# Patient Record
Sex: Male | Born: 1940
Health system: Southern US, Community
[De-identification: ages and names within clinical notes are randomized; demographics above are authoritative.]

## PROBLEM LIST (undated history)

## (undated) DIAGNOSIS — E785 Hyperlipidemia, unspecified: Secondary | ICD-10-CM

## (undated) DIAGNOSIS — M2550 Pain in unspecified joint: Secondary | ICD-10-CM

## (undated) DIAGNOSIS — I2510 Atherosclerotic heart disease of native coronary artery without angina pectoris: Secondary | ICD-10-CM

## (undated) DIAGNOSIS — I739 Peripheral vascular disease, unspecified: Secondary | ICD-10-CM

## (undated) DIAGNOSIS — I4719 Other supraventricular tachycardia: Secondary | ICD-10-CM

## (undated) DIAGNOSIS — I639 Cerebral infarction, unspecified: Secondary | ICD-10-CM

## (undated) DIAGNOSIS — E119 Type 2 diabetes mellitus without complications: Secondary | ICD-10-CM

## (undated) DIAGNOSIS — I779 Disorder of arteries and arterioles, unspecified: Secondary | ICD-10-CM

## (undated) DIAGNOSIS — I10 Essential (primary) hypertension: Secondary | ICD-10-CM

## (undated) DIAGNOSIS — I471 Supraventricular tachycardia: Secondary | ICD-10-CM

## (undated) HISTORY — PX: CARDIAC CATHETERIZATION: SHX172

## (undated) HISTORY — DX: Cerebral infarction, unspecified: I63.9

## (undated) HISTORY — DX: Pain in unspecified joint: M25.50

## (undated) HISTORY — DX: Supraventricular tachycardia: I47.1

## (undated) HISTORY — PX: BACK SURGERY: SHX140

## (undated) HISTORY — DX: Type 2 diabetes mellitus without complications: E11.9

## (undated) HISTORY — DX: Disorder of arteries and arterioles, unspecified: I77.9

## (undated) HISTORY — DX: Other supraventricular tachycardia: I47.19

## (undated) HISTORY — DX: Essential (primary) hypertension: I10

## (undated) HISTORY — DX: Hyperlipidemia, unspecified: E78.5

## (undated) HISTORY — PX: CORONARY ANGIOPLASTY WITH STENT PLACEMENT: SHX49

## (undated) HISTORY — DX: Atherosclerotic heart disease of native coronary artery without angina pectoris: I25.10

## (undated) HISTORY — DX: Peripheral vascular disease, unspecified: I73.9

---

## 2005-08-16 ENCOUNTER — Encounter: Payer: Self-pay | Admitting: Cardiology

## 2005-08-16 ENCOUNTER — Other Ambulatory Visit: Payer: Self-pay

## 2005-08-16 ENCOUNTER — Inpatient Hospital Stay: Payer: Self-pay | Admitting: Internal Medicine

## 2005-08-17 ENCOUNTER — Other Ambulatory Visit: Payer: Self-pay

## 2005-08-17 ENCOUNTER — Encounter: Payer: Self-pay | Admitting: Cardiology

## 2005-08-18 ENCOUNTER — Encounter: Payer: Self-pay | Admitting: Cardiology

## 2005-08-20 ENCOUNTER — Encounter: Payer: Self-pay | Admitting: Cardiology

## 2007-03-12 ENCOUNTER — Encounter: Payer: Self-pay | Admitting: Cardiology

## 2008-05-07 ENCOUNTER — Encounter: Payer: Self-pay | Admitting: Cardiology

## 2008-08-06 ENCOUNTER — Encounter: Payer: Self-pay | Admitting: Cardiology

## 2008-08-06 LAB — CONVERTED CEMR LAB
Albumin: 4.4 g/dL
Alkaline Phosphatase: 76 units/L
Cholesterol: 147 mg/dL
HDL: 37.5 mg/dL
Total Bilirubin: 0.9 mg/dL
Triglyceride fasting, serum: 111 mg/dL

## 2008-11-03 ENCOUNTER — Encounter: Payer: Self-pay | Admitting: Cardiology

## 2009-04-27 ENCOUNTER — Ambulatory Visit: Payer: Self-pay | Admitting: Cardiology

## 2009-04-27 DIAGNOSIS — I259 Chronic ischemic heart disease, unspecified: Secondary | ICD-10-CM | POA: Insufficient documentation

## 2009-04-27 DIAGNOSIS — I25118 Atherosclerotic heart disease of native coronary artery with other forms of angina pectoris: Secondary | ICD-10-CM | POA: Insufficient documentation

## 2009-04-27 DIAGNOSIS — E785 Hyperlipidemia, unspecified: Secondary | ICD-10-CM | POA: Insufficient documentation

## 2009-04-27 DIAGNOSIS — I10 Essential (primary) hypertension: Secondary | ICD-10-CM | POA: Insufficient documentation

## 2009-04-27 DIAGNOSIS — I2510 Atherosclerotic heart disease of native coronary artery without angina pectoris: Secondary | ICD-10-CM

## 2009-05-10 ENCOUNTER — Encounter: Payer: Self-pay | Admitting: Cardiology

## 2009-05-24 ENCOUNTER — Encounter: Payer: Self-pay | Admitting: Cardiology

## 2009-06-01 ENCOUNTER — Ambulatory Visit: Payer: Self-pay

## 2009-06-01 ENCOUNTER — Encounter: Payer: Self-pay | Admitting: Cardiology

## 2009-06-18 ENCOUNTER — Encounter: Payer: Self-pay | Admitting: Cardiology

## 2009-06-18 DIAGNOSIS — R002 Palpitations: Secondary | ICD-10-CM

## 2009-06-18 LAB — CONVERTED CEMR LAB
ALT: 30 units/L (ref 0–53)
CO2: 26 meq/L (ref 19–32)
Calcium: 9.2 mg/dL (ref 8.4–10.5)
Chloride: 101 meq/L (ref 96–112)
Cholesterol: 123 mg/dL (ref 0–200)
Creatinine, Ser: 0.97 mg/dL (ref 0.40–1.50)
Glucose, Bld: 140 mg/dL — ABNORMAL HIGH (ref 70–99)
Total Bilirubin: 0.6 mg/dL (ref 0.3–1.2)
Total CHOL/HDL Ratio: 3.2
Total Protein: 8 g/dL (ref 6.0–8.3)
Triglycerides: 133 mg/dL (ref <150)
VLDL: 27 mg/dL (ref 0–40)

## 2010-04-14 ENCOUNTER — Telehealth: Payer: Self-pay | Admitting: Cardiology

## 2010-04-18 ENCOUNTER — Encounter: Payer: Self-pay | Admitting: Cardiology

## 2010-04-25 ENCOUNTER — Encounter: Payer: Self-pay | Admitting: Cardiology

## 2010-04-25 ENCOUNTER — Ambulatory Visit
Admission: RE | Admit: 2010-04-25 | Discharge: 2010-04-25 | Payer: Self-pay | Source: Home / Self Care | Attending: Cardiology | Admitting: Cardiology

## 2010-04-25 DIAGNOSIS — R0989 Other specified symptoms and signs involving the circulatory and respiratory systems: Secondary | ICD-10-CM | POA: Insufficient documentation

## 2010-05-03 ENCOUNTER — Encounter (INDEPENDENT_AMBULATORY_CARE_PROVIDER_SITE_OTHER): Payer: Self-pay | Admitting: *Deleted

## 2010-05-03 ENCOUNTER — Ambulatory Visit: Admission: RE | Admit: 2010-05-03 | Discharge: 2010-05-03 | Payer: Self-pay | Source: Home / Self Care

## 2010-05-03 NOTE — Assessment & Plan Note (Signed)
Summary: NP6   Visit Type:  New Patient Primary Provider:  Dr. Candelaria Stagers  CC:  no complaints.  History of Present Illness: 70 yo with history of CAD s/p apical MI presents to establish cardiology followup.  Patient had an apical MI in 5/07 with 3 stents placed in the LAD.  His most recent stress test available in the records that I have received was in 12/08 and showed a partially reversible inferoapical defect similar to prior stress in 12/07.  EF was preserved by echo and myoview.    Patient has had no recent chest pain.  He has been doing quite well in general.  He works part time at a [employer].  He is very active and denies any exertional dyspnea.  He is able to climb a flight of steps with no trouble.  No orthopnea or PND.  He is a prior smoker but has been off cigarettes for many years now.  BP is at goal today and diabetes is diet-controlled.   Labs (5/10): LDL 87, HDL 38  ECG: NSR, normal ECG  Current Medications (verified): 1)  Plavix 75 Mg Tabs (Clopidogrel Bisulfate) .... Take One Tablet By Mouth Daily 2)  Lisinopril 5 Mg Tabs (Lisinopril) .... Take One Tablet By Mouth Daily 3)  Aspirin 81 Mg Tbec (Aspirin) .... Take One Tablet By Mouth Daily 4)  Niaspan 500 Mg Cr-Tabs (Niacin (Antihyperlipidemic)) .Marland Kitchen.. 1 Tablets By Mouth Once Daily 5)  Simvastatin 40 Mg Tabs (Simvastatin) .... Take One Tablet By Mouth Daily At Bedtime 6)  Metoprolol Succinate 25 Mg Xr24h-Tab (Metoprolol Succinate) .... Take One Tablet By Mouth Daily  Allergies (verified): No Known Drug Allergies  Past History:  Past Medical History: 1. CAD: Apical MI 5/07 with LHC showing 50% pCFX, 30% pRCA, 99% pLAD, 95% mLAD, 75% dLAD.  Cypher DES x 3 to LAD.  ETT-myoview (12/08): 81% MPHR, 8'1", EF 64%, partially reversible inferoapical perfusion defect similar to prior study 12/07.  2. Hypertension 3. Hyperlipidemia 4. Arthritis 5. Diet-controlled diabetes 6. Echo (7/07): EF 55%, mild MR, mild TR  Family  History: Noncontributory  Social History: Part Time work at [employer] Married  Tobacco Use - Former.  Alcohol Use - no Regular Exercise - no Drug Use - no  Review of Systems       All systems reviewed and negative except as per HPI.   Vital Signs:  Patient profile:   70 year old male Height:      67 inches Weight:      164.25 pounds BMI:     25.82 Pulse rate:   62 / minute Pulse rhythm:   regular BP sitting:   132 / 64  (right arm) Cuff size:   large  Vitals Entered By: Raymond Sampson (April 27, 2009 4:29 PM)  Physical Exam  General:  Well developed, well nourished, in no acute distress. Head:  normocephalic and atraumatic Nose:  no deformity, discharge, inflammation, or lesions Mouth:  Teeth, gums and palate normal. Oral mucosa normal. Neck:  Neck supple, no JVD. No masses, thyromegaly or abnormal cervical nodes. Lungs:  Clear bilaterally to auscultation and percussion. Heart:  Non-displaced PMI, chest non-tender; regular rate and rhythm, S1, S2 without murmurs.  Soft S4. Carotid upstroke normal, no bruit. Pedals normal pulses. No edema, no varicosities. Abdomen:  Bowel sounds positive; abdomen soft and non-tender without masses, organomegaly, or hernias noted. No hepatosplenomegaly. Msk:  Back normal, normal gait. Muscle strength and tone normal. Extremities:  No clubbing or  cyanosis. Neurologic:  Alert and oriented x 3. Skin:  Intact without lesions or rashes. Psych:  Normal affect.   Impression & Recommendations:  Problem # 1:  CAD, NATIVE VESSEL (ICD-414.01) No recent ischemic symptoms and good exercise tolerance.  I discussed Plavix at length with Raymond Sampson.  He is 4 years post-stent placement and risk of stent thrombosis at this point is low.  He has 3 Cypher drug eluting stents.  He has had no bleeding complications.  We decided to continue Plavix for now because of a possible small degree of incremental benefit, especially as he has had no bleeding  problems and has a large surface area of stent.  He will also continue statin, ACEI, ASA, and Toprol XL.  I will get an echo to assess LV systolic function.  No indication for stress test.   Problem # 2:  HYPERLIPIDEMIA-MIXED (ICD-272.4) We need to repeat lipids/LFTs.  LDL was a little above goal (< 70) in 5/10.  Patient is eager to discontinue Niaspan.  He is on a very low dose.  I think it would be ok to stop at this point.  If LDL is  elevated, I will increase the statin.    Problem # 3:  HYPERTENSION, UNSPECIFIED (ICD-401.9) BP at goal. Continue meds.   Patient Instructions: 1)  Your physician recommends that you schedule a follow-up appointment in: 6 months 2)  Your physician recommends that you return for a FASTING lipid profile: 1 month (lipids/cmet/tsh) 05/24/2009 @ 8:00 3)  Your physician has recommended you make the following change in your medication: stop niaspan 4)  Your physician has requested that you have an echocardiogram.  Echocardiography is a painless test that uses sound waves to create images of your heart. It provides your doctor with information about the size and shape of your heart and how well your heart's chambers and valves are working.  This procedure takes approximately one hour. There are no restrictions for this procedure. 06/01/2009 @ 4:00pm  Prevention & Chronic Care Immunizations   Influenza vaccine: Not documented    Tetanus booster: Not documented    Pneumococcal vaccine: Not documented    H. zoster vaccine: Not documented  Colorectal Screening   Hemoccult: Not documented    Colonoscopy: Not documented  Other Screening   PSA: Not documented   Smoking status: quit  (04/27/2009)  Lipids   Total Cholesterol: Not documented   LDL: Not documented   LDL Direct: Not documented   HDL: Not documented   Triglycerides: Not documented    SGOT (AST): Not documented   SGPT (ALT): Not documented   Alkaline phosphatase: Not documented   Total bilirubin:  Not documented  Hypertension   Last Blood Pressure: 132 / 64  (04/27/2009)   Serum creatinine: Not documented   Serum potassium Not documented  Self-Management Support :    Hypertension self-management support: Not documented    Lipid self-management support: Not documented     Appended Document: Orders Update    Clinical Lists Changes  Orders: Added new Referral order of Echocardiogram (Echo) - Signed

## 2010-05-03 NOTE — Progress Notes (Signed)
Summary: [facility]   Imported By: Harlon Flor 04/28/2009 10:10:38  _____________________________________________________________________  External Attachment:    Type:   Image     Comment:   External Document

## 2010-05-03 NOTE — Progress Notes (Signed)
Summary: PHI  PHI   Imported By: Harlon Flor 04/28/2009 10:11:43  _____________________________________________________________________  External Attachment:    Type:   Image     Comment:   External Document

## 2010-05-03 NOTE — Cardiovascular Report (Signed)
Summary: Cardiac Cath Other  Cardiac Cath Other   Imported By: Harlon Flor 04/28/2009 09:06:43  _____________________________________________________________________  External Attachment:    Type:   Image     Comment:   External Document

## 2010-05-03 NOTE — Miscellaneous (Signed)
Summary: update with dx code  Clinical Lists Changes  Problems: Added new problem of PALPITATIONS (ICD-785.1)

## 2010-05-03 NOTE — Letter (Signed)
[facility]   Imported By: Harlon Flor 04/28/2009 09:17:31  _____________________________________________________________________  External Attachment:    Type:   Image     Comment:   External Document

## 2010-05-05 NOTE — Progress Notes (Signed)
Summary: F/U appt/Rx refill  Phone Note Other Incoming   Caller: Pt's wife Summary of Call: Pt's wife called to schedule pt's f/u, he was seen 04/2009 and was to f/u in 6 months. He did not f/u then, he had echo 06/2009. Pt's wife would also like [place] to refill lisinopril. Initial call taken by: Lanny Hurst RN,  April 14, 2010 10:13 AM    New/Updated Medications: LISINOPRIL 5 MG TABS (LISINOPRIL) Take one tablet by mouth daily Prescriptions: LISINOPRIL 5 MG TABS (LISINOPRIL) Take one tablet by mouth daily  #90 x 3   Entered by:   Lanny Hurst RN   Authorized by:   Marca Ancona, MD   Signed by:   Lanny Hurst RN on 04/14/2010   Method used:   Electronically to        [facility] [phone].* (retail)       [address]       [place], [place]  [address]       Ph: [phone]       Fax: [phone]   RxID:   [id number]

## 2010-05-05 NOTE — Assessment & Plan Note (Signed)
Summary: ROV   Visit Type:  Follow-up Primary Provider:  Dr. Candelaria Stagers  CC:  c/o right shoulder pain. Denies chest pain and SOB.Marland Kitchen  History of Present Illness: 70 yo with history of CAD s/p apical MI presents to for cardiology followup.  Patient had an apical MI in 5/07 with 3 stents placed in the LAD.  His most recent stress test available in the records that I have received was in 12/08 and showed a partially reversible inferoapical defect similar to prior stress in 12/07.  Echo in 3/11 showed preserved EF.   Patient has had no recent chest pain.  He has been doing quite well in general.  He works part time at a [employer].  He is very active and denies any exertional dyspnea.  He is able to climb a flight of steps with no trouble.  No orthopnea or PND.  He is a prior smoker but has been off cigarettes for many years now.  BP is at goal today.  He has had to start metformin for diabetes.    Labs (5/10): LDL 87, HDL 38  ECG: NSR, poor anterior R wave progression  Current Medications (verified): 1)  Lisinopril 5 Mg Tabs (Lisinopril) .... Take One Tablet By Mouth Daily 2)  Aspirin 81 Mg Tbec (Aspirin) .... Take One Tablet By Mouth Daily 3)  Simvastatin 40 Mg Tabs (Simvastatin) .... Take One Tablet By Mouth Daily At Bedtime 4)  Metoprolol Succinate 25 Mg Xr24h-Tab (Metoprolol Succinate) .... Take One Tablet By Mouth Daily 5)  Metformin Hcl 500 Mg Tabs (Metformin Hcl) .... 2 Tablets Daily  Allergies (verified): No Known Drug Allergies  Past History:  Family History: Last updated: 04/27/2009 Noncontributory  Social History: Last updated: 04/25/2010 Part Time work at [employer] Married  Tobacco Use - Former.  Alcohol Use - no Regular Exercise - no Drug Use - no  Risk Factors: Exercise: no (04/27/2009)  Risk Factors: Smoking Status: quit (04/27/2009)  Past Medical History: 1. CAD: Apical MI 5/07 with LHC showing 50% pCFX, 30% pRCA, 99% pLAD, 95% mLAD, 75% dLAD.   Cypher DES x 3 to LAD.  ETT-myoview (12/08): 81% MPHR, 8'1", EF 64%, partially reversible inferoapical perfusion defect similar to prior study 12/07.  2. Hypertension 3. Hyperlipidemia 4. Arthritis 5. Diet-controlled diabetes 6. Echo (3/11): EF 55-65%, mild LV hypertrophy, normal wall motion, mild MR, RV normal.   Family History: Reviewed history from 04/27/2009 and no changes required. Noncontributory  Social History: Reviewed history from 04/27/2009 and no changes required. Part Time work at [employer] Married  Tobacco Use - Former.  Alcohol Use - no Regular Exercise - no Drug Use - no  Review of Systems       All systems reviewed and negative except as per HPI.   Vital Signs:  Patient profile:   70 year old male Height:      67 inches Weight:      165 pounds BMI:     25.94 Pulse rate:   80 / minute BP sitting:   124 / 62  (left arm) Cuff size:   regular  Vitals Entered By: Lysbeth Galas CMA (April 25, 2010 4:02 PM)  Physical Exam  General:  Well developed, well nourished, in no acute distress. Neck:  Neck supple, no JVD. No masses, thyromegaly or abnormal cervical nodes. Lungs:  Clear bilaterally to auscultation and percussion. Heart:  Non-displaced PMI, chest non-tender; regular rate and rhythm, S1, S2 without murmurs.  Soft S4. Carotid upstroke  normal, left carotid bruit.  Pedals normal pulses. No edema, no varicosities. Abdomen:  Bowel sounds positive; abdomen soft and non-tender without masses, organomegaly, or hernias noted. No hepatosplenomegaly. Extremities:  No clubbing or cyanosis. Neurologic:  Alert and oriented x 3. Psych:  Normal affect.   Impression & Recommendations:  Problem # 1:  CAROTID BRUIT, LEFT (ICD-785.9) Soft left carotid bruit.  Will get carotid dopplers.   Problem # 2:  CAD, NATIVE VESSEL (ICD-414.01) No recent ischemic symptoms and good exercise tolerance.  I have continued him on Plavix given his 3 drug eluting stents and the  possibility of a degree of incremental benefit in this setting even though his stents were back in 2007, especially as he has had no bleeding problems and has a large surface area of stent.  He will also continue statin, ACEI, ASA, and Toprol XL.  EF preserved on echo in 3/11.    Problem # 3:  HYPERLIPIDEMIA-MIXED (ICD-272.4) Goal LDL < 70.  He had recent lipids at his PCP's office.  I will try to get these to review.   Other Orders: EKG w/ Interpretation (93000) Carotid Duplex (Carotid Duplex)  Patient Instructions: 1)  Your physician recommends that you schedule a follow-up appointment in: 1 year 2)  Your physician recommends that you continue on your current medications as directed. Please refer to the Current Medication list given to you today. 3)  Your physician has requested that you have a carotid duplex. This test is an ultrasound of the carotid arteries in your neck. It looks at blood flow through these arteries that supply the brain with blood. Allow one hour for this exam. There are no restrictions or special instructions.

## 2010-05-27 ENCOUNTER — Telehealth: Payer: Self-pay | Admitting: Cardiology

## 2010-05-31 NOTE — Progress Notes (Signed)
  Phone Note Call from Patient   Caller: Spouse Details for Reason: medication change Summary of Call: The patient would like to switch from Metoprolol ER once daily @ $45.00 for one month to Metoprolol Tartrate 25mg  one two times a day @ $4.00 a month.  What do you recommend?  Please contact @ this [phone]. Initial call taken by: Bishop Dublin, CMA,  May 27, 2010 11:29 AM     Appended Document:  may switch to metoprolol 25 mg two times a day.   Appended Document:  Rx sent in for the metoprolol tart 25 mg one tablet two times a day to [facility] [address]

## 2010-07-27 ENCOUNTER — Inpatient Hospital Stay: Payer: Self-pay | Admitting: Internal Medicine

## 2010-10-26 ENCOUNTER — Encounter: Payer: Self-pay | Admitting: Cardiology

## 2010-11-17 ENCOUNTER — Telehealth: Payer: Self-pay | Admitting: *Deleted

## 2010-11-17 MED ORDER — SIMVASTATIN 40 MG PO TABS: 40.0000 mg | ORAL_TABLET | Freq: Every day | ORAL | Status: DC

## 2010-11-17 NOTE — Telephone Encounter (Signed)
Need a refill sent to [facility] [address]

## 2011-04-18 ENCOUNTER — Encounter: Payer: Self-pay | Admitting: Cardiology

## 2011-04-18 ENCOUNTER — Encounter: Payer: Self-pay | Admitting: *Deleted

## 2011-04-18 ENCOUNTER — Ambulatory Visit (INDEPENDENT_AMBULATORY_CARE_PROVIDER_SITE_OTHER): Payer: Medicare Other | Admitting: Cardiology

## 2011-04-18 VITALS — BP 140/72 | HR 77 | Ht 67.0 in | Wt 161.0 lb

## 2011-04-18 DIAGNOSIS — R064 Hyperventilation: Secondary | ICD-10-CM

## 2011-04-18 DIAGNOSIS — R079 Chest pain, unspecified: Secondary | ICD-10-CM

## 2011-04-18 DIAGNOSIS — E785 Hyperlipidemia, unspecified: Secondary | ICD-10-CM

## 2011-04-18 DIAGNOSIS — R0989 Other specified symptoms and signs involving the circulatory and respiratory systems: Secondary | ICD-10-CM

## 2011-04-18 DIAGNOSIS — I2510 Atherosclerotic heart disease of native coronary artery without angina pectoris: Secondary | ICD-10-CM

## 2011-04-18 NOTE — Patient Instructions (Signed)
Your physician has requested that you have en exercise stress myoview. For further information please visit [email or web address] Please follow instruction sheet, as given.  Your physician has requested that you have a carotid duplex. This test is an ultrasound of the carotid arteries in your neck. It looks at blood flow through these arteries that supply the brain with blood. Allow one hour for this exam. There are no restrictions or special instructions. 05/05/11 @ 2:30 PM at the [facility].  Your physician recommends that you schedule a follow-up appointment in: 1 year

## 2011-04-18 NOTE — Progress Notes (Signed)
PCP: Dr. Candelaria Stagers  71 yo with history of CAD s/p apical MI presents to for cardiology followup. Patient had an apical MI in 5/07 with 3 stents placed in the LAD. His most recent stress test available in the records that I have received was in 12/08 and showed a partially reversible inferoapical defect similar to prior stress in 12/07. Echo in 3/11 showed preserved EF.   He continues to work part time at a [employer]. He is very active and denies any exertional dyspnea. He is able to climb a flight of steps with no trouble. No orthopnea or PND. He is a prior smoker but has been off cigarettes for many years now. BP is borderline today.  He has occasional "twinges" of central chest pain that last < 1 minute at a time.  They seem to be brought on by stress rather than exertion.    Labs (5/10): LDL 87, HDL 38   ECG: NSR, lateral T wave inversions   Allergies (verified):  No Known Drug Allergies   Family History:  No premature CAD  Social History:  Part Time work at [employer]  Married  Tobacco Use - Former.  Alcohol Use - no  Regular Exercise - no  Drug Use - no   ROS: All systems reviewed and negative except as per HPI.   Past Medical History:  1. CAD: Apical MI 5/07 with LHC showing 50% pCFX, 30% pRCA, 99% pLAD, 95% mLAD, 75% dLAD. Cypher DES x 3 to LAD. ETT-myoview (12/08): 81% MPHR, 8'1", EF 64%, partially reversible inferoapical perfusion defect similar to prior study 12/07.  2. Hypertension  3. Hyperlipidemia  4. Arthritis  5. Diet-controlled diabetes  6. Echo (3/11): EF 55-65%, mild LV hypertrophy, normal wall motion, mild MR, RV normal.  7. Carotid stenosis: Dopplers (1/12) with 40-59% bilateral ICA stenosis.    Current Outpatient Prescriptions  Medication Sig Dispense Refill  . aspirin 81 MG tablet Take 160 mg by mouth daily.       Marland Kitchen lisinopril (PRINIVIL,ZESTRIL) 5 MG tablet Take 5 mg by mouth daily.        . metFORMIN (GLUMETZA) 500 MG (MOD) 24 hr tablet Take  500 mg by mouth 3 (three) times daily.       . metoprolol tartrate (LOPRESSOR) 25 MG tablet Take 25 mg by mouth 2 (two) times daily.        . simvastatin (ZOCOR) 40 MG tablet Take 1 tablet (40 mg total) by mouth at bedtime.  90 tablet  3   BP 140/72  Pulse 77  Ht 5\' 7"  (1.702 m)  Wt 73.029 kg (161 lb)  BMI 25.22 kg/m2 General: NAD Neck: No JVD, no thyromegaly or thyroid nodule.  Lungs: Clear to auscultation bilaterally with normal respiratory effort. CV: Nondisplaced PMI.  Heart regular S1/S2, no S3/S4, no murmur.  No peripheral edema.  No carotid bruit.  Normal pedal pulses.  Abdomen: Soft, nontender, no hepatosplenomegaly, no distention.  Neurologic: Alert and oriented x 3.  Psych: Normal affect. Extremities: No clubbing or cyanosis.

## 2011-04-19 NOTE — Assessment & Plan Note (Signed)
Repeat carotid dopplers to look for progression.

## 2011-04-19 NOTE — Assessment & Plan Note (Signed)
I will get recent lipids from PCP.  Goal LDL < 70.    As I am not going to be coming out regularly to the [place] office in the future, I will have Mr Tsang follow with Dr. Mariah Milling or Dr. Kirke Corin.

## 2011-04-19 NOTE — Assessment & Plan Note (Signed)
Atypical chest pain.  Seems to be triggered by stress more than anything else.  Last functional study was in 2008.  I am going to go ahead and get an ETT-myoview for risk stratification.  Continue ASA 81, lisinopril, metoprolol, statin.

## 2011-04-21 ENCOUNTER — Ambulatory Visit: Payer: Self-pay | Admitting: Cardiology

## 2011-04-21 DIAGNOSIS — R079 Chest pain, unspecified: Secondary | ICD-10-CM

## 2011-04-22 ENCOUNTER — Other Ambulatory Visit: Payer: Self-pay | Admitting: Cardiology

## 2011-04-24 ENCOUNTER — Telehealth: Payer: Self-pay | Admitting: *Deleted

## 2011-04-24 NOTE — Telephone Encounter (Signed)
Per Dr. Mariah Milling, pt's myoview was normal, pt notified.

## 2011-04-25 ENCOUNTER — Other Ambulatory Visit: Payer: Self-pay

## 2011-04-25 ENCOUNTER — Telehealth: Payer: Self-pay

## 2011-04-25 DIAGNOSIS — Z79899 Other long term (current) drug therapy: Secondary | ICD-10-CM

## 2011-04-25 DIAGNOSIS — I2510 Atherosclerotic heart disease of native coronary artery without angina pectoris: Secondary | ICD-10-CM

## 2011-04-25 DIAGNOSIS — I10 Essential (primary) hypertension: Secondary | ICD-10-CM

## 2011-04-25 MED ORDER — LISINOPRIL 20 MG PO TABS: 20.0000 mg | ORAL_TABLET | Freq: Every day | ORAL | Status: DC

## 2011-04-25 NOTE — Telephone Encounter (Signed)
A new Rx sent for lisinopril 20 mg take one tablet daily.

## 2011-04-26 ENCOUNTER — Telehealth: Payer: Self-pay | Admitting: *Deleted

## 2011-04-26 NOTE — Telephone Encounter (Signed)
I called pt's daughter, she says Raymond Sampson has already spoken to him about this and Lisinopril 20mg  sent in. He will start 20mg  daily and will return on 2/1 for carotid and have BMET as well. Pt will bring BP readings to appt.   Please let Raymond Sampson know that his nuclear scan looked normal except his BP was way too high. Have him increase lisinopril to 20 mg daily. He will need BMET in 2 wks and have him record BPs at home and see what they are running 2 wks after increasing lisinopril.  ----- Message ----- From: Raymond Sampson Sent: 04/25/2011 9:00 AM To: Raymond Ancona, MD, Raymond Sampson, CMA

## 2011-05-05 ENCOUNTER — Ambulatory Visit (INDEPENDENT_AMBULATORY_CARE_PROVIDER_SITE_OTHER): Payer: Medicare Other | Admitting: *Deleted

## 2011-05-05 ENCOUNTER — Encounter (INDEPENDENT_AMBULATORY_CARE_PROVIDER_SITE_OTHER): Payer: Medicare Other | Admitting: *Deleted

## 2011-05-05 ENCOUNTER — Telehealth: Payer: Self-pay | Admitting: *Deleted

## 2011-05-05 DIAGNOSIS — Z79899 Other long term (current) drug therapy: Secondary | ICD-10-CM

## 2011-05-05 DIAGNOSIS — R0989 Other specified symptoms and signs involving the circulatory and respiratory systems: Secondary | ICD-10-CM

## 2011-05-05 DIAGNOSIS — I2510 Atherosclerotic heart disease of native coronary artery without angina pectoris: Secondary | ICD-10-CM

## 2011-05-05 DIAGNOSIS — I10 Essential (primary) hypertension: Secondary | ICD-10-CM

## 2011-05-05 DIAGNOSIS — I6529 Occlusion and stenosis of unspecified carotid artery: Secondary | ICD-10-CM

## 2011-05-05 MED ORDER — LISINOPRIL 20 MG PO TABS: 20.0000 mg | ORAL_TABLET | Freq: Every day | ORAL | Status: DC

## 2011-05-05 NOTE — Telephone Encounter (Signed)
If creatinine ok, increase lisinopril to 40 mg daily with BMET in 2wks and BP check in 2 wks

## 2011-05-05 NOTE — Telephone Encounter (Signed)
Pt in today for BMET, he dropped off BP numbers from past 2 weeks after incr Lisinopril to 20mg  daily.  In the AM BP ranging 135-156/71-83. Afternoon and evening ranges 143-170/74-80. Please advise what med/dosage pt needs to continue, thanks.

## 2011-05-05 NOTE — Telephone Encounter (Signed)
Note forwarded to Bishop Dublin.

## 2011-05-06 LAB — BASIC METABOLIC PANEL
BUN/Creatinine Ratio: 17 (ref 10–22)
BUN: 17 mg/dL (ref 8–27)
Chloride: 101 mmol/L (ref 97–108)
GFR calc Af Amer: 88 mL/min/{1.73_m2} (ref >59)
Potassium: 4.8 mmol/L (ref 3.5–5.2)

## 2011-05-08 ENCOUNTER — Telehealth: Payer: Self-pay | Admitting: *Deleted

## 2011-05-08 NOTE — Telephone Encounter (Signed)
05/08/11--received message from [place] office to call pt and let him know creatinine normal--please start lisinopril 40mg  1 tab qd--([facility] [address]) # 20 with no refills--wife wants to try med first before ordering 90 day suply--pt will go to [place] office for repeat bmet ON 04/22/11-[place] office aware--nt

## 2011-05-08 NOTE — Telephone Encounter (Signed)
Forwarded to [facility] Triage pool for completion

## 2011-05-11 ENCOUNTER — Telehealth: Payer: Self-pay | Admitting: *Deleted

## 2011-05-11 NOTE — Telephone Encounter (Signed)
[name], [name] - 05/05/11 ','<More Detail >>       Dossie Arbour, RN     05/11/11 wife notified    Sent:  Thu May 11, 2011 8:08 AM   To:  Jacqlyn Krauss, RN                 Result Note       ----- Message ----- From: Oneida Arenas Sent: 05/11/2011 7:48 AM To: [facility] Triage  Forwarded to [facility] Triage to call patient with results  ----- Message ----- From: Marca Ancona, MD Sent: 05/10/2011 4:56 PM To: Bishop Dublin, CMA, Karoline Caldwell  60-79% RICA, followup in 6 months                     Result Notes     Notes Recorded by Marca Ancona, MD on 05/10/2011 at 4:56 PM 60-79% RICA, followup in 6 months              Results Carotid duplex (Order [id number])         Carotid duplex Status: Final result MyChart: Not Released Next appt with me: None Dx: CAROTID BRUIT, LEFT       Notes Recorded by Marca Ancona, MD on 05/10/2011 at 4:56 PM 60-79% RICA, followup in 6 months    Last Resulted: 05/05/11 3:25 PM      Raymond Sampson - 05/05/11 ','<More Detail >>       Dossie Arbour, RN         Sent:  Thu May 11, 2011 8:08 AM   To:  Jacqlyn Krauss, RN                 Result Note       ----- Message ----- From: Oneida Arenas Sent: 05/11/2011 7:48 AM To: Donne Hazel Triage  Forwarded to [facility] Triage to call patient with results  ----- Message ----- From: Marca Ancona, MD Sent: 05/10/2011 4:56 PM To: Bishop Dublin, CMA, Karoline Caldwell  60-79% RICA, followup in 6 months                     Result Notes     Notes Recorded by Marca Ancona, MD on 05/10/2011 at 4:56 PM 60-79% RICA, followup in 6 months              Results Carotid duplex (Order [id number])         Carotid duplex Status: Final result MyChart: Not Released Next appt with me: None Dx: CAROTID BRUIT, LEFT       Notes Recorded by Marca Ancona, MD on 05/10/2011 at 4:56 PM 60-79% RICA, followup in 6 months    Last Resulted:  05/05/11 3:25 PM      Raymond Sampson - 05/05/11 ','<More Detail >>       Dossie Arbour, RN         Sent:  Thu May 11, 2011 8:08 AM   To:  Jacqlyn Krauss, RN                 Result Note       ----- Message ----- From: Oneida Arenas Sent: 05/11/2011 7:48 AM To: Donne [facility]el Triage  Forwarded to [facility] Triage to call patient with results  ----- Message ----- From: Marca Ancona, MD Sent: 05/10/2011 4:56 PM To: Bishop Dublin, CMA, Karoline Caldwell  60-79% RICA, followup in 6 months  Result Notes     Notes Recorded by Marca Ancona, MD on 05/10/2011 at 4:56 PM 60-79% RICA, followup in 6 months              Results Carotid duplex (Order [id number])         Carotid duplex Status: Final result MyChart: Not Released Next appt with me: None Dx: CAROTID BRUIT, LEFT       Notes Recorded by Marca Ancona, MD on 05/10/2011 at 4:56 PM 60-79% RICA, followup in 6 months    Last Resulted: 05/05/11 3:25 PM

## 2011-05-23 ENCOUNTER — Other Ambulatory Visit: Payer: Medicare Other

## 2011-05-24 ENCOUNTER — Ambulatory Visit (INDEPENDENT_AMBULATORY_CARE_PROVIDER_SITE_OTHER): Payer: Medicare Other

## 2011-05-24 DIAGNOSIS — Z79899 Other long term (current) drug therapy: Secondary | ICD-10-CM

## 2011-05-25 ENCOUNTER — Telehealth: Payer: Self-pay | Admitting: *Deleted

## 2011-05-25 LAB — BASIC METABOLIC PANEL
BUN/Creatinine Ratio: 19 (ref 10–22)
BUN: 17 mg/dL (ref 8–27)
Chloride: 101 mmol/L (ref 97–108)
GFR calc Af Amer: 100 mL/min/{1.73_m2} (ref >59)
GFR calc non Af Amer: 86 mL/min/{1.73_m2} (ref >59)

## 2011-05-25 NOTE — Telephone Encounter (Signed)
Luan Moore, RN << Less Detail     Luan Moore, RN       Sent: Wed May 24, 2011  4:45 PM    To: Jacqlyn Krauss, RN       Bassem Bernasconi    MRN: [id number] DOB: Aug 11, 1940    Pt Home: [phone]               Message     Pt seen in office for BMP 2 weeks after start of Lisinopril.  BP checked per pt request 138/82.    I will forward to Dr Shirlee Latch.

## 2011-05-29 ENCOUNTER — Telehealth: Payer: Self-pay | Admitting: *Deleted

## 2011-05-29 NOTE — Telephone Encounter (Signed)
Message copied by Burnell Blanks on Mon May 29, 2011 11:01 AM ------      Message from: Phoebe Sharps      Created: Sun May 28, 2011  1:07 PM       Lab work looks normal after start of lisinopril.      We continue to work on your sugar level/diabetes

## 2011-05-29 NOTE — Telephone Encounter (Signed)
Advised wife of labs.  She stated that after he increased his Lisinopril to 40 mg has had "breaking out" and itching.  No problems at the 20 mg daily.  She has also noticed that he has had an increase in fatigue and white of eyes may be a little yellow. States he just does not feel as good on this higher dose.  They do not check blood pressure at home, need another monitor.  Will forward to Dr Shirlee Latch for review

## 2011-05-29 NOTE — Telephone Encounter (Signed)
Advised wife.  She will call back with blood pressure numbers in a couple of weeks.  Husband has appointment this afternoon at doctors office and they will take blood pressure monitor to make sure it is working properly.  Will forward to Dr Alford Highland nurse to make her aware

## 2011-05-29 NOTE — Telephone Encounter (Signed)
Can go back to prior dose but needs to follow BP.  Call in a couple of weeks to see what it is running.

## 2011-06-20 ENCOUNTER — Other Ambulatory Visit: Payer: Self-pay

## 2011-06-20 ENCOUNTER — Telehealth: Payer: Self-pay | Admitting: Cardiology

## 2011-06-20 MED ORDER — METOPROLOL TARTRATE 25 MG PO TABS: 25.0000 mg | ORAL_TABLET | Freq: Two times a day (BID) | ORAL | Status: DC

## 2011-06-20 NOTE — Telephone Encounter (Signed)
New Msg: Pharmacy calling with questions regarding metoprolol tartrate. Please return call to discuss further.   Pt is in the pharmacy now.

## 2011-06-20 NOTE — Telephone Encounter (Signed)
06/20/11--pharmacist calling wanting to fill toprol and lisinopril with enough to cover for 3 months--order given--nt

## 2011-07-13 ENCOUNTER — Encounter: Payer: Self-pay | Admitting: Cardiovascular Disease

## 2011-07-25 ENCOUNTER — Other Ambulatory Visit: Payer: Self-pay | Admitting: *Deleted

## 2011-07-25 ENCOUNTER — Other Ambulatory Visit: Payer: Self-pay | Admitting: Cardiology

## 2011-07-25 MED ORDER — LISINOPRIL 20 MG PO TABS: 20.0000 mg | ORAL_TABLET | Freq: Every day | ORAL | Status: DC

## 2011-07-25 NOTE — Telephone Encounter (Signed)
NEEDS 90 DAY supply

## 2011-07-25 NOTE — Telephone Encounter (Signed)
Refill was sent incorrectly. Resent lisinopril for 90 day supply to pharmacy.

## 2011-07-27 ENCOUNTER — Other Ambulatory Visit: Payer: Self-pay | Admitting: Cardiology

## 2011-07-27 NOTE — Telephone Encounter (Signed)
Daughter called and stated needs refill on lisinopril, but they had changed from whats on the current med list to 10mg  per [facility] due to problems.  They need 90 day supply called into [facility] [address].

## 2011-07-27 NOTE — Telephone Encounter (Signed)
Spoke to patients wife regarding lisinopril 10mg  refill request she is requesting due to no notes verifying when the patient was switched to lower dosage of lisinopril by Dr. Shirlee Latch. Pt mentioned that she is requesting 10mg  lisinopril tablets instead of 20mg  lisinopril tablets because wife mentioned that her husband was still having rash and itching because of the 20mg  lisinopril and since she started cutting his 20mg  tablets of lisinopril in 1/2 he has not been having this reaction.

## 2011-07-28 MED ORDER — LISINOPRIL 20 MG PO TABS: 20.0000 mg | ORAL_TABLET | Freq: Every day | ORAL | Status: DC

## 2011-08-21 ENCOUNTER — Telehealth: Payer: Self-pay | Admitting: Cardiovascular Disease

## 2011-08-21 NOTE — Telephone Encounter (Signed)
Pt wife called stating that pt is having severe cramps all over his body. They think it is coming from his medication simvastatin

## 2011-08-21 NOTE — Telephone Encounter (Signed)
LMTCB

## 2011-08-22 NOTE — Telephone Encounter (Signed)
Pt wife called back. She says pt has been experiencing severe leg cramps at hs. He is able to "walk them off" but then experiences cramps in arms and "ribcage" occasionally throughout day. Denies vomiting, diarrhea or drinking less fluids than usual.  Is urinating ok.  Denies slurred speech, blurred vision. Went to pick up simvastatin rx yesterday and pharm told pt about SE so pt did not want to get refill. Patient also has carotid u/s scheduled for August. Patient says he has felt pulsations in neck more than usual and wife notices times when pt seems to "stare off in to space". Has hx CVA.  I advised to have pt hold simvastatin until i discuss with Dr. Shirlee Latch. Wife asks that we discuss with Dr. Mariah Milling since he is in [place] office. I will do this at pt. Request.

## 2011-08-23 NOTE — Telephone Encounter (Signed)
Spoke with pt's wife. I informed her of Dr. Windell Hummingbird plan. She verb. Understanding and will call us should cramping not resolve after holding simvastatin.

## 2011-08-23 NOTE — Telephone Encounter (Signed)
LMTCB

## 2011-08-23 NOTE — Telephone Encounter (Signed)
Best thing would be to hold simvastatin for now to see if cramps resolve If they go away by holding medication, then we would try a different cholesterol medication  I evaluated his carotid ultrasound February 2013 There is approximately 40-50% bilateral blockage  Not significant enough to do surgery This will progress very slowly, over years, if at all Would continue aspirin for now Cholesterol pill when tolerated

## 2011-11-06 ENCOUNTER — Other Ambulatory Visit: Payer: Self-pay | Admitting: Cardiology

## 2011-11-06 DIAGNOSIS — I6529 Occlusion and stenosis of unspecified carotid artery: Secondary | ICD-10-CM

## 2011-11-07 ENCOUNTER — Encounter (INDEPENDENT_AMBULATORY_CARE_PROVIDER_SITE_OTHER): Payer: Medicare Other

## 2011-11-07 DIAGNOSIS — I6529 Occlusion and stenosis of unspecified carotid artery: Secondary | ICD-10-CM

## 2012-04-17 ENCOUNTER — Ambulatory Visit (INDEPENDENT_AMBULATORY_CARE_PROVIDER_SITE_OTHER): Payer: Medicare Other | Admitting: Cardiovascular Disease

## 2012-04-17 ENCOUNTER — Encounter: Payer: Self-pay | Admitting: Cardiovascular Disease

## 2012-04-17 VITALS — BP 148/80 | HR 73 | Ht 67.0 in | Wt 161.0 lb

## 2012-04-17 DIAGNOSIS — E785 Hyperlipidemia, unspecified: Secondary | ICD-10-CM

## 2012-04-17 DIAGNOSIS — I2510 Atherosclerotic heart disease of native coronary artery without angina pectoris: Secondary | ICD-10-CM

## 2012-04-17 DIAGNOSIS — R0989 Other specified symptoms and signs involving the circulatory and respiratory systems: Secondary | ICD-10-CM

## 2012-04-17 DIAGNOSIS — I10 Essential (primary) hypertension: Secondary | ICD-10-CM

## 2012-04-17 MED ORDER — ATORVASTATIN CALCIUM 10 MG PO TABS: 10.0000 mg | ORAL_TABLET | Freq: Every day | ORAL | Status: DC

## 2012-04-17 NOTE — Assessment & Plan Note (Signed)
Currently with no symptoms of angina. No further workup at this time. Continue current medication regimen. 

## 2012-04-17 NOTE — Assessment & Plan Note (Signed)
He has a rash with simvastatin even on repeat trial. We have suggested he try generic Lipitor 10 mg daily. This can be titrated upwards as tolerated.

## 2012-04-17 NOTE — Progress Notes (Signed)
Patient ID: Raymond Sampson, male    DOB: 05/26/1940, 72 y.o.   MRN: [id number]  HPI Comments: 72 yo with history of CAD s/p apical MI presents to for cardiology followup.  He has 40-50% bilateral carotid disease  Patient had an apical MI in 5/07 with 3 stents placed in the LAD.  stress test 12/08  showed a partially reversible inferoapical defect similar to prior stress in 12/07.  Echo in 3/11 showed preserved EF.    He is very active and denies any exertional dyspnea. He is able to climb a flight of steps with no trouble.  He is a prior smoker for 30 years but stopped smoking 25 years ago but has been off cigarettes for many years now. He reports that he stop simvastatin and this caused a rash. Even after retrying the medicine he had a rash in. He reports diabetes mellitus has been well controlled  Lab work when he was on simvastatin shows total cholesterol 151, LDL 81, HDL 33 in November 2013 Hemoglobin A1c 6.7  ECG: EKG shows normal sinus rhythm with rate 73 beats per minute with nonspecific T wave abnormality in 1 and aVL   Outpatient Encounter Prescriptions as of 04/17/2012  Medication Sig Dispense Refill  . aspirin 81 MG tablet Take 160 mg by mouth daily.       Marland Kitchen lisinopril (PRINIVIL,ZESTRIL) 20 MG tablet Take 1 tablet (20 mg total) by mouth daily.  90 tablet  3  . metFORMIN (GLUCOPHAGE) 1000 MG tablet Take 1,000 mg by mouth 2 (two) times daily with a meal.      . metoprolol tartrate (LOPRESSOR) 25 MG tablet Take 1 tablet (25 mg total) by mouth 2 (two) times daily.  90 tablet  4    Review of Systems  Constitutional: Negative.   HENT: Negative.   Eyes: Negative.   Respiratory: Negative.   Cardiovascular: Negative.   Gastrointestinal: Negative.   Musculoskeletal: Negative.   Skin: Positive for rash.  Neurological: Negative.   Hematological: Negative.   Psychiatric/Behavioral: Negative.   All other systems reviewed and are negative.     BP 148/80  Pulse 73  Ht 5\' 7"   (1.702 m)  Wt 161 lb (73.029 kg)  BMI 25.22 kg/m2  Physical Exam  Nursing note and vitals reviewed. Constitutional: He is oriented to person, place, and time. He appears well-developed and well-nourished.  HENT:  Head: Normocephalic.  Nose: Nose normal.  Mouth/Throat: Oropharynx is clear and moist.  Eyes: Conjunctivae normal are normal. Pupils are equal, round, and reactive to light.  Neck: Normal range of motion. Neck supple. No JVD present.  Cardiovascular: Normal rate, regular rhythm, S1 normal, S2 normal, normal heart sounds and intact distal pulses.  Exam reveals no gallop and no friction rub.   No murmur heard. Pulmonary/Chest: Effort normal and breath sounds normal. No respiratory distress. He has no wheezes. He has no rales. He exhibits no tenderness.  Abdominal: Soft. Bowel sounds are normal. He exhibits no distension. There is no tenderness.  Musculoskeletal: Normal range of motion. He exhibits no edema and no tenderness.  Lymphadenopathy:    He has no cervical adenopathy.  Neurological: He is alert and oriented to person, place, and time. Coordination normal.  Skin: Skin is warm and dry. No rash noted. No erythema.  Psychiatric: He has a normal mood and affect. His behavior is normal. Judgment and thought content normal.           Assessment and Plan

## 2012-04-17 NOTE — Patient Instructions (Addendum)
You are doing well.  Hold the metoprolol and see if side effects go away If side effects continue, go back on metoprolol  In a few weeks, after the rash is better,  Start cholesterol medication, atorvastatin one a day  Please call us if you have new issues that need to be addressed before your next appt.  Your physician wants you to follow-up in: 6 months.  You will receive a reminder letter in the mail two months in advance. If you don't receive a letter, please call our office to schedule the follow-up appointment.

## 2012-04-17 NOTE — Assessment & Plan Note (Signed)
Moderate bilateral carotid disease, continue aggressive cholesterol management 

## 2012-04-17 NOTE — Assessment & Plan Note (Signed)
Blood pressure is well controlled on today's visit. No changes made to the medications. 

## 2012-07-08 ENCOUNTER — Other Ambulatory Visit: Payer: Self-pay | Admitting: *Deleted

## 2012-07-08 MED ORDER — METOPROLOL TARTRATE 25 MG PO TABS: 25.0000 mg | ORAL_TABLET | Freq: Two times a day (BID) | ORAL | Status: DC

## 2012-07-08 NOTE — Telephone Encounter (Signed)
Pt needs 90 day script

## 2012-07-09 ENCOUNTER — Other Ambulatory Visit: Payer: Self-pay

## 2012-07-09 MED ORDER — METOPROLOL TARTRATE 25 MG PO TABS: 25.0000 mg | ORAL_TABLET | Freq: Two times a day (BID) | ORAL | Status: DC

## 2012-08-06 ENCOUNTER — Emergency Department: Payer: Self-pay | Admitting: Emergency Medicine

## 2012-08-11 ENCOUNTER — Emergency Department: Payer: Self-pay | Admitting: Emergency Medicine

## 2012-08-19 ENCOUNTER — Other Ambulatory Visit: Payer: Self-pay | Admitting: *Deleted

## 2012-08-19 MED ORDER — LISINOPRIL 20 MG PO TABS: 20.0000 mg | ORAL_TABLET | Freq: Every day | ORAL | Status: DC

## 2012-08-19 NOTE — Telephone Encounter (Signed)
Per patient's daughter they would like 3 month supply called into [facility]

## 2012-08-19 NOTE — Telephone Encounter (Signed)
Refilled Lisinopril sent to [facility].

## 2012-12-27 ENCOUNTER — Observation Stay: Payer: Self-pay | Admitting: Internal Medicine

## 2012-12-27 LAB — BASIC METABOLIC PANEL
Anion Gap: 5 — ABNORMAL LOW (ref 7–16)
Chloride: 101 mmol/L (ref 98–107)
Creatinine: 1 mg/dL (ref 0.60–1.30)
EGFR (African American): >60
EGFR (Non-African Amer.): >60
Glucose: 107 mg/dL — ABNORMAL HIGH (ref 65–99)
Osmolality: 271 (ref 275–301)
Potassium: 4 mmol/L (ref 3.5–5.1)
Sodium: 135 mmol/L — ABNORMAL LOW (ref 136–145)

## 2012-12-27 LAB — CBC
HCT: 41.5 % (ref 40.0–52.0)
HGB: 14.5 g/dL (ref 13.0–18.0)
MCH: 30.8 pg (ref 26.0–34.0)
MCV: 88 fL (ref 80–100)
RBC: 4.7 10*6/uL (ref 4.40–5.90)
RDW: 13.6 % (ref 11.5–14.5)
WBC: 8.6 10*3/uL (ref 3.8–10.6)

## 2012-12-27 LAB — TROPONIN I: Troponin-I: <0.02 ng/mL

## 2012-12-28 DIAGNOSIS — G459 Transient cerebral ischemic attack, unspecified: Secondary | ICD-10-CM

## 2012-12-28 DIAGNOSIS — I6789 Other cerebrovascular disease: Secondary | ICD-10-CM

## 2012-12-28 DIAGNOSIS — I2510 Atherosclerotic heart disease of native coronary artery without angina pectoris: Secondary | ICD-10-CM

## 2012-12-28 LAB — LIPID PANEL
Cholesterol: 138 mg/dL (ref 0–200)
HDL Cholesterol: 34 mg/dL — ABNORMAL LOW (ref 40–60)
Triglycerides: 197 mg/dL (ref 0–200)

## 2013-01-01 ENCOUNTER — Telehealth: Payer: Self-pay | Admitting: *Deleted

## 2013-01-01 NOTE — Telephone Encounter (Signed)
Please call wife between 12:00 and 12:30 today please. Yang was in the ED recently and was told by PCP to follow up by a vein and vascular doctor she has some questions. Please advise. Thanks

## 2013-01-02 NOTE — Telephone Encounter (Signed)
Spoke w/ pt's wife.   She wanted to let Dr. Mariah Milling to know that pt is being seen by a Vein &  Vascular MD.

## 2013-01-20 ENCOUNTER — Ambulatory Visit: Payer: Self-pay | Admitting: Vascular Surgery

## 2013-02-21 ENCOUNTER — Telehealth: Payer: Self-pay

## 2013-02-21 NOTE — Telephone Encounter (Signed)
Pt wife states pt needs Plavix refilled [facility], Dr Dan Humphreys prescribed this in hospital. Pt wife states he has enough to last through Wednesday.

## 2013-02-24 ENCOUNTER — Other Ambulatory Visit: Payer: Self-pay | Admitting: *Deleted

## 2013-02-24 MED ORDER — CLOPIDOGREL BISULFATE 75 MG PO TABS: 75.0000 mg | ORAL_TABLET | Freq: Every day | ORAL | Status: DC

## 2013-02-24 NOTE — Telephone Encounter (Signed)
Requested Prescriptions   Signed Prescriptions Disp Refills  . clopidogrel (PLAVIX) 75 MG tablet 30 tablet 2    Sig: Take 1 tablet (75 mg total) by mouth daily with breakfast.    Authorizing Provider: Antonieta Iba    Ordering User: Kendrick Fries

## 2013-02-24 NOTE — Telephone Encounter (Signed)
Lmtcb pt needs to schedule future appointment with Dr. Mariah Milling to be seen in Jan.  Sent refill for Plavix 75 mg qd to [facility] pharmacy.

## 2013-05-14 ENCOUNTER — Encounter: Payer: Self-pay | Admitting: Cardiovascular Disease

## 2013-05-14 ENCOUNTER — Ambulatory Visit (INDEPENDENT_AMBULATORY_CARE_PROVIDER_SITE_OTHER): Payer: Medicare Other | Admitting: Cardiovascular Disease

## 2013-05-14 VITALS — BP 162/62 | HR 74 | Ht 67.0 in | Wt 159.8 lb

## 2013-05-14 DIAGNOSIS — E785 Hyperlipidemia, unspecified: Secondary | ICD-10-CM

## 2013-05-14 DIAGNOSIS — I6529 Occlusion and stenosis of unspecified carotid artery: Secondary | ICD-10-CM | POA: Insufficient documentation

## 2013-05-14 DIAGNOSIS — R002 Palpitations: Secondary | ICD-10-CM

## 2013-05-14 DIAGNOSIS — I10 Essential (primary) hypertension: Secondary | ICD-10-CM

## 2013-05-14 DIAGNOSIS — I2510 Atherosclerotic heart disease of native coronary artery without angina pectoris: Secondary | ICD-10-CM

## 2013-05-14 NOTE — Assessment & Plan Note (Signed)
Currently with no symptoms of angina. No further workup at this time. Continue current medication regimen. 

## 2013-05-14 NOTE — Assessment & Plan Note (Signed)
Cholesterol is close to goal on the current lipid regimen. No changes to the medications were made.  

## 2013-05-14 NOTE — Assessment & Plan Note (Signed)
Blood pressure is well controlled on today's visit. No changes made to the medications. 

## 2013-05-14 NOTE — Assessment & Plan Note (Signed)
40-59% bilateral carotid stenosis in 2013. We'll need to continue aggressive cholesterol management. Repeat carotid ultrasound on next visit.

## 2013-05-14 NOTE — Patient Instructions (Signed)
You are doing well. No medication changes were made.  Please call us if you have new issues that need to be addressed before your next appt.  Your physician wants you to follow-up in: 12 months.  You will receive a reminder letter in the mail two months in advance. If you don't receive a letter, please call our office to schedule the follow-up appointment. 

## 2013-05-14 NOTE — Progress Notes (Signed)
Patient ID: Raymond Sampson, male    DOB: 04-08-1940, 73 y.o.   MRN: [id number]  HPI Comments: 73 yo male with history of CAD s/p apical MI presents to for cardiology followup.  He has 40-50% bilateral carotid disease, last evaluated in 2013  apical MI in 5/07 with 3 stents placed in the LAD.  Prior history of smoking for 30 years, stopped smoking 25 years ago  stress test 12/08  showed a partially reversible inferoapical defect similar to prior stress in 12/07.  Echo in 3/11 showed preserved EF.    He is very active and denies any exertional dyspnea. He is able to climb a flight of steps with no trouble.   Reports having a rash on simvastatin. He has been tolerating low-dose Lipitor. He also takes over-the-counter herbal medication which he thinks has improved his cholesterol 60 points  He reports diabetes mellitus has been well controlled  Most recent blood work showed total cholesterol 154, LDL 87, HDL 37, hemoglobin A1c 6.5  ECG: EKG shows normal sinus rhythm with rate 74 beats per minute with nonspecific ST and T wave abnormality in leads V3 through V6, inferior leads   Outpatient Encounter Prescriptions as of 05/14/2013  Medication Sig  . aspirin 81 MG tablet Take 160 mg by mouth daily.   Marland Kitchen atorvastatin (LIPITOR) 10 MG tablet Take 1 tablet (10 mg total) by mouth daily.  . clopidogrel (PLAVIX) 75 MG tablet Take 1 tablet (75 mg total) by mouth daily with breakfast.  . lisinopril (PRINIVIL,ZESTRIL) 20 MG tablet Take 1 tablet (20 mg total) by mouth daily.  . metFORMIN (GLUCOPHAGE) 1000 MG tablet Take 1,000 mg by mouth 2 (two) times daily with a meal.  . metoprolol tartrate (LOPRESSOR) 25 MG tablet Take 1 tablet (25 mg total) by mouth 2 (two) times daily.    Review of Systems  Constitutional: Negative.   HENT: Negative.   Eyes: Negative.   Respiratory: Negative.   Cardiovascular: Negative.   Gastrointestinal: Negative.   Endocrine: Negative.   Musculoskeletal: Negative.   Skin:  Positive for rash.  Allergic/Immunologic: Negative.   Neurological: Negative.   Hematological: Negative.   Psychiatric/Behavioral: Negative.   All other systems reviewed and are negative.     BP 162/62  Pulse 74  Ht 5\' 7"  (1.702 m)  Wt 159 lb 12 oz (72.462 kg)  BMI 25.01 kg/m2  Physical Exam  Nursing note and vitals reviewed. Constitutional: He is oriented to person, place, and time. He appears well-developed and well-nourished.  HENT:  Head: Normocephalic.  Nose: Nose normal.  Mouth/Throat: Oropharynx is clear and moist.  Eyes: Conjunctivae are normal. Pupils are equal, round, and reactive to light.  Neck: Normal range of motion. Neck supple. No JVD present. Carotid bruit is present.    Cardiovascular: Normal rate, regular rhythm, S1 normal, S2 normal, normal heart sounds and intact distal pulses.  Exam reveals no gallop and no friction rub.   No murmur heard. Pulmonary/Chest: Effort normal and breath sounds normal. No respiratory distress. He has no wheezes. He has no rales. He exhibits no tenderness.  Abdominal: Soft. Bowel sounds are normal. He exhibits no distension. There is no tenderness.  Musculoskeletal: Normal range of motion. He exhibits no edema and no tenderness.  Lymphadenopathy:    He has no cervical adenopathy.  Neurological: He is alert and oriented to person, place, and time. Coordination normal.  Skin: Skin is warm and dry. No rash noted. No erythema.  Psychiatric: He has a normal  mood and affect. His behavior is normal. Judgment and thought content normal.      Assessment and Plan

## 2013-08-19 ENCOUNTER — Other Ambulatory Visit: Payer: Self-pay | Admitting: Cardiovascular Disease

## 2013-08-22 ENCOUNTER — Other Ambulatory Visit: Payer: Self-pay

## 2013-08-22 ENCOUNTER — Other Ambulatory Visit: Payer: Self-pay | Admitting: Cardiovascular Disease

## 2013-08-22 ENCOUNTER — Encounter: Payer: Self-pay | Admitting: *Deleted

## 2013-08-22 MED ORDER — LISINOPRIL 20 MG PO TABS: ORAL_TABLET | ORAL | Status: DC

## 2013-10-06 NOTE — Telephone Encounter (Signed)
This encounter was created in error - please disregard.

## 2014-05-14 ENCOUNTER — Ambulatory Visit (INDEPENDENT_AMBULATORY_CARE_PROVIDER_SITE_OTHER): Payer: PPO | Admitting: Cardiovascular Disease

## 2014-05-14 ENCOUNTER — Encounter: Payer: Self-pay | Admitting: Cardiovascular Disease

## 2014-05-14 ENCOUNTER — Encounter (INDEPENDENT_AMBULATORY_CARE_PROVIDER_SITE_OTHER): Payer: Self-pay

## 2014-05-14 VITALS — BP 145/80 | HR 60 | Ht 67.0 in | Wt 163.2 lb

## 2014-05-14 DIAGNOSIS — I2510 Atherosclerotic heart disease of native coronary artery without angina pectoris: Secondary | ICD-10-CM

## 2014-05-14 DIAGNOSIS — R002 Palpitations: Secondary | ICD-10-CM

## 2014-05-14 DIAGNOSIS — I10 Essential (primary) hypertension: Secondary | ICD-10-CM

## 2014-05-14 DIAGNOSIS — E785 Hyperlipidemia, unspecified: Secondary | ICD-10-CM

## 2014-05-14 DIAGNOSIS — I6523 Occlusion and stenosis of bilateral carotid arteries: Secondary | ICD-10-CM

## 2014-05-14 MED ORDER — SIMVASTATIN 20 MG PO TABS: 20.0000 mg | ORAL_TABLET | Freq: Every day | ORAL | Status: DC

## 2014-05-14 NOTE — Assessment & Plan Note (Signed)
Recommended he increase the simvastatin up to 20 mg daily, goal LDL less than 70

## 2014-05-14 NOTE — Patient Instructions (Signed)
You are doing well.  Please increase the simvastatin up to 20 mg daily  Please call us if you have new issues that need to be addressed before your next appt.  Your physician wants you to follow-up in: 6 months.  You will receive a reminder letter in the mail two months in advance. If you don't receive a letter, please call our office to schedule the follow-up appointment.

## 2014-05-14 NOTE — Assessment & Plan Note (Signed)
Moderate bilateral carotid disease, continue aggressive cholesterol management

## 2014-05-14 NOTE — Assessment & Plan Note (Signed)
Currently with no symptoms of angina. No further workup at this time. Continue current medication regimen. 

## 2014-05-14 NOTE — Assessment & Plan Note (Signed)
Blood pressure is elevated today. Mild improvement on recheck. Suggested he monitor his blood pressure at home

## 2014-05-14 NOTE — Progress Notes (Signed)
Patient ID: Raymond Sampson, male    DOB: 01/22/1941, 74 y.o.   MRN: [id number]  HPI Comments: 74 yo male with history of CAD s/p apical MI presents for routine follow-up of his coronary artery disease He has 40-50% bilateral carotid disease, last evaluated in 2013   apical MI in 5/07 with 3 stents placed in the LAD.  Prior history of smoking for 30 years, stopped smoking 25 years ago  In follow-up today, he reports that he is doing well. He is taking various supplements provided by his wife sold over-the-counter. They appeared to be fish oil supplements. He is taking simvastatin 10 mg daily. Recent total cholesterol above goal at 168, LDL 88 He previously reports having a rash on high-dose simvastatin. He does not take Lipitor. Denies having any myalgias on 10 mg daily of simvastatin. Blood pressure elevated on today's visit but he reports it is normally low at home  EKG on today's visit shows normal sinus rhythm with no significant ST or T-wave changes  Other past medical history stress test 12/08  showed a partially reversible inferoapical defect similar to prior stress in 12/07.  Echo in 3/11 showed preserved EF.   Previous blood work showed total cholesterol 154, LDL 87, HDL 37, hemoglobin A1c 6.5  No Active Allergies  Outpatient Encounter Prescriptions as of 05/14/2014  Medication Sig  . aspirin 81 MG tablet Take 81 mg by mouth daily.   . clopidogrel (PLAVIX) 75 MG tablet Take 1 tablet (75 mg total) by mouth daily with breakfast.  . lisinopril (PRINIVIL,ZESTRIL) 20 MG tablet TAKE 1 TABLET (20 MG TOTAL) BY MOUTH DAILY.  . metFORMIN (GLUCOPHAGE) 1000 MG tablet Take 1,000 mg by mouth 2 (two) times daily with a meal.  . metoprolol tartrate (LOPRESSOR) 25 MG tablet TAKE 1 TABLET (25 MG TOTAL) BY MOUTH 2 (TWO) TIMES DAILY.  . NON FORMULARY Probio 5 takes 2 tablets at bedtime.  . NON FORMULARY Plexus Slim Takes 1 tablet daily.  . NON FORMULARY MegaX Takes 1 tablet every other day.  .  simvastatin (ZOCOR) 20 MG tablet Take 1 tablet (20 mg total) by mouth daily.  . [DISCONTINUED] atorvastatin (LIPITOR) 10 MG tablet Take 1 tablet (10 mg total) by mouth daily.  . [DISCONTINUED] simvastatin (ZOCOR) 10 MG tablet Take 10 mg by mouth daily.  . [DISCONTINUED] lisinopril (PRINIVIL,ZESTRIL) 20 MG tablet TAKE 1 TABLET (20 MG TOTAL) BY MOUTH DAILY. (Patient not taking: Reported on 05/14/2014)    Past Medical History  Diagnosis Date  . CAD (coronary artery disease)     apical MI 5/07 with LHC showing 50% pCFX, 30% pRCA, 99% pLAD, 9%% mLAD, 75% dLAD. cypher DES x3 to LAD. ETT-myoview (12/08) 81% MPHR, 8'1, EF 64%, partially reversible inferoapical perfusion defect similar to prior study 12/07  . HTN (hypertension)   . HLD (hyperlipidemia)   . Arthritic-like pain   . Diet-controlled type 2 diabetes mellitus     History reviewed. No pertinent past surgical history.  Social History  reports that he quit smoking about 29 years ago. His smoking use included Cigarettes. He has a 25 pack-year smoking history. He does not have any smokeless tobacco history on file. He reports that he does not drink alcohol or use illicit drugs.  Family History Family history is unknown by patient.   Review of Systems  Constitutional: Negative.   Respiratory: Negative.   Cardiovascular: Negative.   Gastrointestinal: Negative.   Musculoskeletal: Negative.   Skin: Positive for rash.  Neurological: Negative.   Hematological: Negative.   Psychiatric/Behavioral: Negative.   All other systems reviewed and are negative.  BP 158/88 mmHg  Pulse 60  Ht 5\' 7"  (1.702 m)  Wt 163 lb 4 oz (74.05 kg)  BMI 25.56 kg/m2  Physical Exam  Constitutional: He is oriented to person, place, and time. He appears well-developed and well-nourished.  HENT:  Head: Normocephalic.  Nose: Nose normal.  Mouth/Throat: Oropharynx is clear and moist.  Eyes: Conjunctivae are normal. Pupils are equal, round, and reactive to  light.  Neck: Normal range of motion. Neck supple. No JVD present. Carotid bruit is present.    Cardiovascular: Normal rate, regular rhythm, S1 normal, S2 normal, normal heart sounds and intact distal pulses.  Exam reveals no gallop and no friction rub.   No murmur heard. Pulmonary/Chest: Effort normal and breath sounds normal. No respiratory distress. He has no wheezes. He has no rales. He exhibits no tenderness.  Abdominal: Soft. Bowel sounds are normal. He exhibits no distension. There is no tenderness.  Musculoskeletal: Normal range of motion. He exhibits no edema or tenderness.  Lymphadenopathy:    He has no cervical adenopathy.  Neurological: He is alert and oriented to person, place, and time. Coordination normal.  Skin: Skin is warm and dry. No rash noted. No erythema.  Psychiatric: He has a normal mood and affect. His behavior is normal. Judgment and thought content normal.      Assessment and Plan   Nursing note and vitals reviewed.

## 2014-07-24 NOTE — Consult Note (Signed)
General Aspect 74 yo with history of DM, CAD s/p apical MI in 5/07 with 3 stents placed in the LAD, 40-50% bilateral carotid disease, stress test 12/08  showed a partially reversible inferoapical defect similar to prior stress in 12/07. Echo in 3/11 showed preserved EF, prior smoker for 30 years but stopped smoking 25 years ago  previous CVA back in April 2012, who is presenting with vision changes and arm numbness.   he reports that at 1:00 p.m yesterday, he had sudden vision change in his left eye, blurriness,  with a frontal headache 2 out of 10 in intensity,  throbbing, nonradiating. No worsening or relieving factors, followed by left arm numbness. He stated that he felt his arm was "dead", though he still had full strength and able to move his arm. No weakness. This event lasted approximately 30 to 40 minutes in total and then all symptoms resolved.    In the Emergency Department, initial CT head has revealed no acute findings. On initial presentation, he was markedly hypertensive at 225/100. He received 25 mg of p.o. Lopressor, and dose of aspirin. Currently, he is without complaint.   Lab work when he was on simvastatin shows total cholesterol 151, LDL 81, HDL 33 in November 2013 Hemoglobin A1c 6.7   Present Illness . FAMILY HISTORY: Significant for TIA as well as diabetes and brother with lung cancer.   SOCIAL HISTORY: Previous tobacco abuse, has not smoked in about 2 to 3 years now. Denies any alcohol usage. Married, lives with his wife.   Physical Exam:  GEN well developed, well nourished, no acute distress   HEENT PERRL, hearing intact to voice   NECK supple   RESP normal resp effort  clear BS   CARD Regular rate and rhythm  No murmur   ABD denies tenderness  soft   LYMPH negative neck   EXTR negative edema   SKIN normal to palpation   NEURO motor/sensory function intact   PSYCH alert, A+O to time, place, person, good insight   Review of Systems:   Subjective/Chief Complaint Left arm numbness, vision changes, resolved   General: No Complaints   Skin: No Complaints   ENT: No Complaints   Eyes: No Complaints   Neck: No Complaints   Respiratory: No Complaints   Cardiovascular: No Complaints   Gastrointestinal: No Complaints   Genitourinary: No Complaints   Vascular: No Complaints   Musculoskeletal: No Complaints   Neurologic: vision changes, left arm numbess   Hematologic: No Complaints   Endocrine: No Complaints   Psychiatric: No Complaints   Review of Systems: All other systems were reviewed and found to be negative   Medications/Allergies Reviewed Medications/Allergies reviewed     heart attack:    CVA:    knee surgery:    back surgery:        Admit Diagnosis:   TIA: Onset Date: 28-Dec-2012, Status: Active, Description: TIA  Home Medications:  Medication Instructions Status  clopidogrel 75 mg oral tablet 1 tab(s) orally once a day Active  simvastatin 10 mg oral tablet 2 tab(s) orally once a day (at bedtime) Active  aspirin 81 mg oral tablet 1 tab(s) orally once a day Active  lisinopril 20 mg oral tablet 1 tab(s) orally once a day Active  metFORMIN 500 mg oral tablet 1 tab(s) orally 2 times a day Active  Metoprolol Tartrate 25 mg oral tablet 1 tab(s) orally 2 times a day Active  simvastatin 10 mg oral tablet 1 tab(s) orally  once a day (at bedtime) Active   Lab Results:  Routine Chem:  26-Sep-14 17:21   Glucose, Serum  107  BUN 15  Creatinine (comp) 1.00  Sodium, Serum  135  Potassium, Serum 4.0  Chloride, Serum 101  CO2, Serum 29  Calcium (Total), Serum 9.6  Anion Gap  5  Osmolality (calc) 271  eGFR (African American) >60  eGFR (Non-African American) >60 (eGFR values <66m/min/1.73 m2 may be an indication of chronic kidney disease (CKD). Calculated eGFR is useful in patients with stable renal function. The eGFR calculation will not be reliable in acutely ill patients when serum  creatinine is changing rapidly. It is not useful in  patients on dialysis. The eGFR calculation may not be applicable to patients at the low and high extremes of body sizes, pregnant women, and vegetarians.)  Cardiac:  26-Sep-14 17:21   Troponin I < 0.02 (0.00-0.05 0.05 ng/mL or less: NEGATIVE  Repeat testing in 3-6 hrs  if clinically indicated. >0.05 ng/mL: POTENTIAL  MYOCARDIAL INJURY. Repeat  testing in 3-6 hrs if  clinically indicated. NOTE: An increase or decrease  of 30% or more on serial  testing suggests a  clinically important change)  Routine Hem:  26-Sep-14 17:21   WBC (CBC) 8.6  RBC (CBC) 4.70  Hemoglobin (CBC) 14.5  Hematocrit (CBC) 41.5  Platelet Count (CBC) 224 (Result(s) reported on 27 Dec 2012 at 05:50PM.)  MCV 88  MCH 30.8  MCHC 34.9  RDW 13.6   EKG:  Interpretation EKG shows normal sinus rhythm with rate 57 beats per minute with nonspecific T wave abnormality in 1 and aVL, V5 and V6   Radiology Results: [place]    27-Sep-14 09:05, [place]  [place] Bilateral   REASON FOR EXAM:    cva  COMMENTS:       PROCEDURE: [place] - [place] DOPPLER BILATERAL  - Dec 28 2012  9:05AM     RESULT: Grayscale and color flow Doppler techniques were employed to   evaluate the cervical carotid arteries.    On the right at the level of the carotid bulb there is soft plaque. In   the proximal ICA there is calcified and soft plaque. The waveform   patterns and color flow images do not suggest significant turbulence or   stenoses. On the right the peak internal carotid systolic velocity   measured 110 cm/sec and the peak common carotid velocity measured 88   cm/sec corresponding to a normal ratio of 1.3.  On the left and there is soft plaque in the distal CCA and at the carotid   bulb and soft and calcified plaque in the proximal internal carotid   arteries. The waveform patterns and color flow images do not suggest   significant stenoses. The  peak internal carotid systolic velocity on the   right measured 111 cm/sec and the peak common carotid velocity measured   90 cm/sec corresponding to a normal ratio of 1.2. The vertebral arteries   are normal in flow direction bilaterally.    IMPRESSION:  There is plaque involving both carotid systems. On the right   in the proximal ICA a stenosis reaching as much is 70% is suspected.   There is no evidence of a hemodynamically significant carotid stenosis.     Dictation Site: 5      Verified By: DAVID A. JMartinique M.D., MD  MRI:    27-Sep-14 09:45, MRI Brain Without Contrast  MRI Brain Without Contrast  REASON FOR EXAM:    CVA  COMMENTS:       PROCEDURE: MR  - MR BRAIN WO CONTRAST  - Dec 28 2012  9:45AM     RESULT: Multiplanar images were obtained through the brain without   administration of gadolinium.    The diffusion-weighted sequences exhibit no findings suspicious for acute   ischemia. The inversion recovery-FLAIR sequences reveal increased   periventricular white matter signal as well as punctate areas of more   peripheral increased signal in both cerebral hemispheres. There is   abnormalsignal in the left posterior parietal lobe consistent with   previous ischemic infarction and subsequent encephalomalacia. There is no   evidence of acute intracranial hemorrhage. There is no evidence of     intracranial mass effect. There are small amounts of mucoperiosteal   thickening in the maxillary sinuses.    IMPRESSION:   1. There is no evidence of acute ischemic change.  2. There are findings consistent with encephalomalacia in the left   posterior parietal lobe and the site of previous ischemic insult.  3. There are foci of increased white matter signal in both cerebral   hemispheres consistent with chronic small vessel ischemic change.  4. There is no intracranial mass effect nor hydrocephalus.     Dictation Site: 5        VerifiedBy: DAVID A. Martin[place], M.D., MD  CT:     26-Sep-14 17:29, CT Head Without Contrast  CT Head Without Contrast   REASON FOR EXAM:    LEFT ARM NUMBNESS  COMMENTS:       PROCEDURE: CT  - CT HEAD WITHOUT CONTRAST  - Dec 27 2012  5:29PM     RESULT: History: Left arm numbness.    Comparison Study: No prior.    Findings: Standard nonenhanced CT is obtained. Encephalomalacia noted   along the posterior left temporal parietal region consistent with old   infarct. Diffuse atrophy present. Orbits are unremarkable. No acute bony   abnormality. Mucous retention cyst left max or sinus. This is small.   Mastoids are clear.  IMPRESSION:  No acute abnormality.        Verified By: Osa Craver, M.D., MD    IVP Dye: N/V  Toprol XL: Other  Vital Signs/Nurse's Notes: **Vital Signs.:   27-Sep-14 08:00  Vital Signs Type Routine  Temperature Temperature (F) 97.7  Celsius 36.5  Temperature Source oral  Pulse Pulse 70  Respirations Respirations 18  Systolic BP Systolic BP 226  Diastolic BP (mmHg) Diastolic BP (mmHg) 74  Mean BP 95  Pulse Ox % Pulse Ox % 96  Pulse Ox Activity Level  At rest  Oxygen Delivery Room Air/ 21 %    Impression 74 yo with history of DM, CAD s/p apical MI in 5/07 with 3 stents placed in the LAD, 40-50% bilateral carotid disease, stress test 12/08  showed a partially reversible inferoapical defect similar to prior stress in 12/07. Echo in 3/11 showed preserved EF, prior smoker for 30 years but stopped smoking 25 years ago  previous CVA back in April 2012, who is presenting with vision changes and arm numbness.   1) TIA: sx resolved suspect secondary to emolic pathology from underlying carotid disease. Peak velocities consistent with 40 to 59% disease b/l, same as seen previously (75% stenosis as detailed in report is likely an overestimation) --Would rtecommend asa and plavix. He does report significant bruising previously on plavix (up to the patient whether he would like to  take asa plavix). Given this  is his secondarty TIA/CVA (last in 2012), would recommend asa/plavix  2) CAD, h/o stents No active ischemia, cardiac enz negative  3) Mild dementia Follow up with Dr. Ola Spurr. May need medication Suggested he start walking program for balance instability  4) Hyperlipidemia He will increase the simvastatin to 20 mg daily  5) Type: 2 diabetes. on metformin   Electronic Signatures: Ida Rogue (MD)  (Signed 27-Sep-14 14:20)  Authored: General Aspect/Present Illness, History and Physical Exam, Review of System, Past Medical History, Health Issues, Home Medications, Labs, EKG , Radiology, Allergies, Vital Signs/Nurse's Notes, Impression/Plan   Last Updated: 27-Sep-14 14:20 by Ida Rogue (MD)

## 2014-07-24 NOTE — H&P (Signed)
PATIENT NAME:  Raymond Sampson MR#:  [id number] DATE OF BIRTH:  October 15, 1940  DATE OF ADMISSION:  12/27/2012  REFERRING PHYSICIAN: Wells Guiles L. Reita Cliche, MD  PRIMARY CARE PHYSICIAN: Cheral Marker. Ola Spurr, MD  CHIEF COMPLAINT: Left arm numbness.   HISTORY OF PRESENT ILLNESS: Mr. Raymond Sampson is a 74 year old Caucasian gentleman with past medical history of coronary artery disease, status post PCI with drug-eluting stenting x 3 to the RCA, LAD proximal and mid vessel, hypertension, hyperlipidemia and previous CVA back in April 2012, who is presenting with vision changes and arm numbness. At 1:00 p.m., he noticed sudden vision change in his left eye described as vision blurriness. He did not note amaurosis fugax or complete loss of vision. This was associated with a frontal headache 2 out of 10 in intensity, described as throbbing, nonradiating. No worsening or relieving factors. This was shortly followed by left arm numbness, which encompassed the entire arm. He stated that he felt his arm was dead, though he still had full strength and able to move his arm. He once again did not noticed any weakness. This event lasted approximately 30 to 40 minutes in total and then all symptoms resolved. Later in the day he told his wife about his symptoms that occurred earlier and upon her encouragement, he has presented to [facility] for work-up and evaluation. In the Emergency Department, initial CT head has revealed no acute findings. On initial presentation, he was markedly hypertensive at 225/100. He received 25 mg of p.o. Lopressor, and he also received a full dose of aspirin. Currently, he is without complaint.   REVIEW OF SYSTEMS:    CONSTITUTIONAL: Denies fevers, chills, fatigue, weakness.  EYES: Vision changes as above. Currently denies any visual problems. Denies any eye pain.  EARS, NOSE, THROAT:  Denies any ear pain, discharge, dysphagia.  RESPIRATORY: Denies cough, wheezing, shortness of breath.   CARDIOVASCULAR: Denies chest pain, palpitations or arrhythmia.  GASTROINTESTINAL: Denies nausea, vomiting, diarrhea, abdominal pain.  GENITOURINARY: Denies dysuria, hematuria.  ENDOCRINE: Denies nocturia or thyroid problems.  HEMATOLOGIC AND LYMPHATIC: Denies easy bruising or bleeding.  SKIN: Denies any rashes or lesions.  MUSCULOSKELETAL: Denies any pain in his neck, back, shoulders, knees. Denies any arthritis or swelling.  NEUROLOGIC: Paresthesias as above. Denies any paralysis. Headache as above.  PSYCHIATRIC: Denies any anxiety or depressive symptoms.   Otherwise, full review of systems performed by me is negative.   PAST MEDICAL HISTORY: Coronary artery disease, status post PCI with stenting with a drug-eluting stent x 3 to the RCA, LAD proximal and mid vessel, hypertension, hyperlipidemia, previous CVA in April 2012.   FAMILY HISTORY: Significant for TIA as well as diabetes and brother with lung cancer.   SOCIAL HISTORY: Previous tobacco abuse, has not smoked in about 2 to 3 years now. Denies any alcohol usage. Married, lives with his wife.   ALLERGIES: IV DYE AS WELL AS TOPROL-XL.   HOME MEDICATIONS: Aspirin 325 mg 1/2 tab p.o. at bedtime, lisinopril 20 mg p.o. daily, metformin 500 mg p.o. b.i.d., metoprolol tartrate 25 mg p.o. b.i.d., simvastatin 10 mg p.o. at bedtime.   PHYSICAL EXAMINATION: VITAL SIGNS: Temperature 98.2, heart rate 64, respirations 18, blood pressure 209/98, saturating 96% on room air. Weight 72.6 kg, BMI 25.1.  GENERAL: Well-nourished, well-developed gentleman in no acute distress.  HEENT: Head: Normocephalic, atraumatic. Eyes: Pupils equal, round and reactive to light as well as accommodation. Extraocular muscles intact. No scleral icterus noted. Mouth: Moist mucosal membranes. Dentition intact. No abscess noted ear,  nose and throat. Throat is clear without exudate. No external lesions.  NECK: Supple. No thyromegaly or nodules noted. No JVD.  PULMONARY:  Clear to auscultation bilaterally without wheezes, rubs or rhonchi. No use of accessory muscles. Good respiratory effort.  CHEST: Nontender to palpation.  CARDIOVASCULAR: S1, S2, regular rate and rhythm. No murmurs, rubs or gallops. No edema noted. Pedal pulses 2+ bilaterally.  ABDOMEN: Soft, nontender, nondistended. No masses. No hepatosplenomegaly. Positive bowel sounds.  MUSCULOSKELETAL: No edema, cyanosis or clubbing. Range of motion is full in all extremities.  NEUROLOGIC: Cranial nerves II through XII intact. Pronator drift and dysdiadokinesis normal. Gait is not tested. Romberg is not tested. Strength is 5 out of 5 in all extremities. Sensation to fine touch intact in all extremities. Hand grip 5 out of 5. Reflexes are intact.  SKIN: No ulcerations, lesions or rashes. No cyanosis. Skin is warm and dry. Turgor intact.  PSYCHIATRIC: Mood and affect are within normal limits. He is alert and oriented x 3. Insight and judgment are intact.   LABORATORY, DIAGNOSTIC AND RADIOLOGICAL DATA: Sodium 135, potassium 4, chloride 101, bicarbonate 29, BUN 15, creatinine 1, glucose 107. Troponin I less than 0.02. WBC 8.6, hemoglobin 14.5, platelets 224.   CT head revealing no acute intracranial process.   EKG: Sinus bradycardia, heart rate of 57. No ST or T wave abnormalities.   ASSESSMENT AND PLAN: This is a 74 year old gentleman with history of coronary artery disease, status post stenting, as well as hypertension, hyperlipidemia and previous cerebrovascular accident presenting with left vision change as well as left arm numbness, which is transient in nature, lasting between 30 and 40 minutes. He is presenting approximately 8  hours after symptoms. 1.  Transient ischemic attack: Neuro checks q.4 hours. Given aspirin and statin already. Check an MRI of brain as well as carotid Dopplers, transthoracic echocardiogram and lipid panel. As far as his blood pressure control is concerned, his last blood pressure was  220/120. No intervention is required given acute transient ischemic attack. If indeed elevates, would give labetalol 10 mg IV x 1 and repeat x 1 if necessary.  2.  Hypertensive urgency in setting of transient ischemic attack: All symptoms had resolved prior to presentation to the Emergency Department. Blood pressure currently 205/98. Treatment course as above. Continue home medications including lisinopril, metoprolol. I suspect that this will trend down without any intervention.  3.  Type: 2 diabetes. Hold p.o. agents. Add insulin sliding scale.  4.  Hyperlipidemia: Continue with Zocor.  5.  Venous thromboembolism prophylaxis: Heparin subcutaneously.   CODE STATUS: The patient is full code.   TIME SPENT: 45 minutes.    ____________________________ Aaron Mose. Hower, MD [id number] D: 12/27/2012 21:03:36 ET T: 12/27/2012 21:42:33 ET JOB#: [id number]  cc: Aaron Mose. Hower, MD, <Dictator> DAVID Woodfin Ganja MD ELECTRONICALLY SIGNED 12/28/2012 21:54

## 2014-08-30 ENCOUNTER — Other Ambulatory Visit: Payer: Self-pay | Admitting: Cardiovascular Disease

## 2014-11-14 ENCOUNTER — Other Ambulatory Visit: Payer: Self-pay | Admitting: Cardiovascular Disease

## 2015-04-06 ENCOUNTER — Encounter: Payer: Self-pay | Admitting: Cardiovascular Disease

## 2015-04-06 ENCOUNTER — Ambulatory Visit (INDEPENDENT_AMBULATORY_CARE_PROVIDER_SITE_OTHER): Payer: PPO | Admitting: Cardiovascular Disease

## 2015-04-06 VITALS — BP 134/64 | HR 66 | Ht 68.0 in | Wt 162.2 lb

## 2015-04-06 DIAGNOSIS — I10 Essential (primary) hypertension: Secondary | ICD-10-CM

## 2015-04-06 DIAGNOSIS — R002 Palpitations: Secondary | ICD-10-CM | POA: Diagnosis not present

## 2015-04-06 DIAGNOSIS — E118 Type 2 diabetes mellitus with unspecified complications: Secondary | ICD-10-CM | POA: Diagnosis not present

## 2015-04-06 DIAGNOSIS — I2510 Atherosclerotic heart disease of native coronary artery without angina pectoris: Secondary | ICD-10-CM

## 2015-04-06 DIAGNOSIS — E785 Hyperlipidemia, unspecified: Secondary | ICD-10-CM

## 2015-04-06 DIAGNOSIS — E119 Type 2 diabetes mellitus without complications: Secondary | ICD-10-CM | POA: Insufficient documentation

## 2015-04-06 NOTE — Assessment & Plan Note (Signed)
Currently with no symptoms of angina. No further workup at this time. Continue current medication regimen. 

## 2015-04-06 NOTE — Assessment & Plan Note (Signed)
Blood pressure is well controlled on today's visit. No changes made to the medications. 

## 2015-04-06 NOTE — Assessment & Plan Note (Signed)
Cholesterol is at goal on the current lipid regimen. No changes to the medications were made.  

## 2015-04-06 NOTE — Progress Notes (Signed)
Patient ID: Raymond Sampson, male    DOB: 1940/04/16, 75 y.o.   MRN: [id number]  HPI Comments: 75 yo male with history of CAD s/p apical MI presents for routine follow-up of his coronary artery disease He has 40-50% bilateral carotid disease, last evaluated in 2013   apical MI in 5/07 with 3 stents placed in the LAD.  Prior history of smoking for 30 years, stopped smoking 25 years ago   in follow-up today, he reports that he is doing well  denies any chest pain or shortness of breath on exertion, no leg edema  Active, no regular exercise program  Tolerating simvastatin 20 mg daily  He does have some mild leg cramping at night  blood pressure well controlled at home  hemoglobin A1c 6.8  Total cholesterol 140, LDL 69  EKG on today's visit shows normal sinus rhythm  Rate 66 bpm with no significant ST or T-wave changes  Other past medical history stress test 12/08  showed a partially reversible inferoapical defect similar to prior stress in 12/07.  Echo in 3/11 showed preserved EF.   Previous blood work showed total cholesterol 154, LDL 87, HDL 37, hemoglobin A1c 6.5  No Active Allergies  Outpatient Encounter Prescriptions as of 04/06/2015  Medication Sig  . aspirin 81 MG tablet Take 81 mg by mouth daily.   . clopidogrel (PLAVIX) 75 MG tablet Take 1 tablet (75 mg total) by mouth daily with breakfast.  . lisinopril (PRINIVIL,ZESTRIL) 20 MG tablet TAKE 1 TABLET (20 MG TOTAL) BY MOUTH DAILY.  . metFORMIN (GLUCOPHAGE) 1000 MG tablet Take 1,000 mg by mouth 2 (two) times daily with a meal.  . metoprolol tartrate (LOPRESSOR) 25 MG tablet TAKE 1 TABLET (25 MG TOTAL) BY MOUTH 2 (TWO) TIMES DAILY.  . simvastatin (ZOCOR) 20 MG tablet Take 1 tablet (20 mg total) by mouth daily.  . [DISCONTINUED] NON FORMULARY Reported on 04/06/2015  . [DISCONTINUED] NON FORMULARY Reported on 04/06/2015  . [DISCONTINUED] NON FORMULARY Reported on 04/06/2015   No facility-administered encounter medications on file as  of 04/06/2015.    Past Medical History  Diagnosis Date  . HTN (hypertension)   . HLD (hyperlipidemia)   . Arthritic-like pain   . Diet-controlled type 2 diabetes mellitus ([place])   . CAD (coronary artery disease)     apical MI 5/07 with LHC showing 50% pCFX, 30% pRCA, 99% pLAD, 9%% mLAD, 75% dLAD. cypher DES x3 to LAD. ETT-myoview (12/08) 81% MPHR, 8'1, EF 64%, partially reversible inferoapical perfusion defect similar to prior study 12/07    Past Surgical History  Procedure Laterality Date  . Back surgery    . Cardiac catheterization    . Coronary angioplasty with stent placement      apical MI 5/07 with LHC showing 50% pCFX, 30% pRCA, 99% pLAD, 9%% mLAD, 75% dLAD. cypher DES x3 to LAD. ETT-myoview (12/08) 81% MPHR, 8'1, EF 64%, partially reversible inferoapical perfusion defect similar to prior study 12/07    Social History  reports that he quit smoking about 29 years ago. His smoking use included Cigarettes. He has a 25 pack-year smoking history. He does not have any smokeless tobacco history on file. He reports that he does not drink alcohol or use illicit drugs.  Family History Family history is unknown by patient.   Review of Systems  Constitutional: Negative.   Respiratory: Negative.   Cardiovascular: Negative.   Gastrointestinal: Negative.   Musculoskeletal: Negative.   Skin: Positive for rash.  Neurological:  Negative.   Hematological: Negative.   Psychiatric/Behavioral: Negative.   All other systems reviewed and are negative.  BP 134/64 mmHg  Pulse 66  Ht 5\' 8"  (1.727 m)  Wt 162 lb 4 oz (73.596 kg)  BMI 24.68 kg/m2  Physical Exam  Constitutional: He is oriented to person, place, and time. He appears well-developed and well-nourished.  HENT:  Head: Normocephalic.  Nose: Nose normal.  Mouth/Throat: Oropharynx is clear and moist.  Eyes: Conjunctivae are normal. Pupils are equal, round, and reactive to light.  Neck: Normal range of motion. Neck supple. No JVD  present. Carotid bruit is present.  Cardiovascular: Normal rate, regular rhythm, S1 normal, S2 normal, normal heart sounds and intact distal pulses.  Exam reveals no gallop and no friction rub.   No murmur heard. Pulmonary/Chest: Effort normal and breath sounds normal. No respiratory distress. He has no wheezes. He has no rales. He exhibits no tenderness.  Abdominal: Soft. Bowel sounds are normal. He exhibits no distension. There is no tenderness.  Musculoskeletal: Normal range of motion. He exhibits no edema or tenderness.  Lymphadenopathy:    He has no cervical adenopathy.  Neurological: He is alert and oriented to person, place, and time. Coordination normal.  Skin: Skin is warm and dry. No rash noted. No erythema.  Psychiatric: He has a normal mood and affect. His behavior is normal. Judgment and thought content normal.      Assessment and Plan   Nursing note and vitals reviewed.

## 2015-04-06 NOTE — Assessment & Plan Note (Signed)
We have encouraged continued exercise, careful diet management in an effort to lose weight. 

## 2015-04-06 NOTE — Patient Instructions (Addendum)
You are doing well. No medication changes were made.  Consider taking Co EnZ Q10 for leg cramping   Please call us if you have new issues that need to be addressed before your next appt.  Your physician wants you to follow-up in: 12 months.  You will receive a reminder letter in the mail two months in advance. If you don't receive a letter, please call our office to schedule the follow-up appointment.

## 2015-05-24 DIAGNOSIS — E1165 Type 2 diabetes mellitus with hyperglycemia: Secondary | ICD-10-CM | POA: Diagnosis not present

## 2015-05-24 DIAGNOSIS — I259 Chronic ischemic heart disease, unspecified: Secondary | ICD-10-CM | POA: Diagnosis not present

## 2015-05-24 DIAGNOSIS — E7800 Pure hypercholesterolemia, unspecified: Secondary | ICD-10-CM | POA: Diagnosis not present

## 2015-05-24 DIAGNOSIS — I10 Essential (primary) hypertension: Secondary | ICD-10-CM | POA: Diagnosis not present

## 2015-05-31 DIAGNOSIS — E7800 Pure hypercholesterolemia, unspecified: Secondary | ICD-10-CM | POA: Diagnosis not present

## 2015-05-31 DIAGNOSIS — I10 Essential (primary) hypertension: Secondary | ICD-10-CM | POA: Diagnosis not present

## 2015-05-31 DIAGNOSIS — J069 Acute upper respiratory infection, unspecified: Secondary | ICD-10-CM | POA: Diagnosis not present

## 2015-05-31 DIAGNOSIS — E119 Type 2 diabetes mellitus without complications: Secondary | ICD-10-CM | POA: Diagnosis not present

## 2015-05-31 DIAGNOSIS — F329 Major depressive disorder, single episode, unspecified: Secondary | ICD-10-CM | POA: Diagnosis not present

## 2015-06-05 ENCOUNTER — Other Ambulatory Visit: Payer: Self-pay | Admitting: Cardiovascular Disease

## 2015-06-09 DIAGNOSIS — J069 Acute upper respiratory infection, unspecified: Secondary | ICD-10-CM | POA: Diagnosis not present

## 2015-11-05 ENCOUNTER — Other Ambulatory Visit: Payer: Self-pay | Admitting: Cardiovascular Disease

## 2015-11-22 DIAGNOSIS — E7800 Pure hypercholesterolemia, unspecified: Secondary | ICD-10-CM | POA: Diagnosis not present

## 2015-11-22 DIAGNOSIS — E119 Type 2 diabetes mellitus without complications: Secondary | ICD-10-CM | POA: Diagnosis not present

## 2015-11-22 DIAGNOSIS — J069 Acute upper respiratory infection, unspecified: Secondary | ICD-10-CM | POA: Diagnosis not present

## 2015-11-22 DIAGNOSIS — I10 Essential (primary) hypertension: Secondary | ICD-10-CM | POA: Diagnosis not present

## 2015-11-23 ENCOUNTER — Other Ambulatory Visit: Payer: Self-pay | Admitting: Cardiovascular Disease

## 2015-11-29 DIAGNOSIS — I10 Essential (primary) hypertension: Secondary | ICD-10-CM | POA: Diagnosis not present

## 2015-11-29 DIAGNOSIS — R29898 Other symptoms and signs involving the musculoskeletal system: Secondary | ICD-10-CM | POA: Diagnosis not present

## 2015-11-29 DIAGNOSIS — R2689 Other abnormalities of gait and mobility: Secondary | ICD-10-CM | POA: Diagnosis not present

## 2015-11-29 DIAGNOSIS — I259 Chronic ischemic heart disease, unspecified: Secondary | ICD-10-CM | POA: Diagnosis not present

## 2015-11-29 DIAGNOSIS — E7800 Pure hypercholesterolemia, unspecified: Secondary | ICD-10-CM | POA: Diagnosis not present

## 2015-11-29 DIAGNOSIS — F329 Major depressive disorder, single episode, unspecified: Secondary | ICD-10-CM | POA: Diagnosis not present

## 2015-11-29 DIAGNOSIS — Z0000 Encounter for general adult medical examination without abnormal findings: Secondary | ICD-10-CM | POA: Diagnosis not present

## 2016-02-07 DIAGNOSIS — G250 Essential tremor: Secondary | ICD-10-CM | POA: Diagnosis not present

## 2016-02-07 DIAGNOSIS — R2689 Other abnormalities of gait and mobility: Secondary | ICD-10-CM | POA: Diagnosis not present

## 2016-02-07 DIAGNOSIS — R262 Difficulty in walking, not elsewhere classified: Secondary | ICD-10-CM | POA: Diagnosis not present

## 2016-02-07 DIAGNOSIS — Z8673 Personal history of transient ischemic attack (TIA), and cerebral infarction without residual deficits: Secondary | ICD-10-CM | POA: Diagnosis not present

## 2016-02-07 DIAGNOSIS — R413 Other amnesia: Secondary | ICD-10-CM | POA: Insufficient documentation

## 2016-03-13 DIAGNOSIS — R262 Difficulty in walking, not elsewhere classified: Secondary | ICD-10-CM | POA: Diagnosis not present

## 2016-03-13 DIAGNOSIS — R413 Other amnesia: Secondary | ICD-10-CM | POA: Diagnosis not present

## 2016-03-13 DIAGNOSIS — E538 Deficiency of other specified B group vitamins: Secondary | ICD-10-CM | POA: Diagnosis not present

## 2016-03-13 DIAGNOSIS — R2689 Other abnormalities of gait and mobility: Secondary | ICD-10-CM | POA: Diagnosis not present

## 2016-04-12 DIAGNOSIS — E538 Deficiency of other specified B group vitamins: Secondary | ICD-10-CM | POA: Diagnosis not present

## 2016-04-26 ENCOUNTER — Ambulatory Visit (INDEPENDENT_AMBULATORY_CARE_PROVIDER_SITE_OTHER): Payer: PPO | Admitting: Cardiovascular Disease

## 2016-04-26 ENCOUNTER — Encounter: Payer: Self-pay | Admitting: Cardiovascular Disease

## 2016-04-26 VITALS — BP 158/70 | HR 70 | Ht 67.0 in | Wt 163.2 lb

## 2016-04-26 DIAGNOSIS — R9431 Abnormal electrocardiogram [ECG] [EKG]: Secondary | ICD-10-CM

## 2016-04-26 DIAGNOSIS — I2510 Atherosclerotic heart disease of native coronary artery without angina pectoris: Secondary | ICD-10-CM

## 2016-04-26 DIAGNOSIS — I4892 Unspecified atrial flutter: Secondary | ICD-10-CM | POA: Diagnosis not present

## 2016-04-26 DIAGNOSIS — I6529 Occlusion and stenosis of unspecified carotid artery: Secondary | ICD-10-CM | POA: Diagnosis not present

## 2016-04-26 DIAGNOSIS — E118 Type 2 diabetes mellitus with unspecified complications: Secondary | ICD-10-CM

## 2016-04-26 DIAGNOSIS — R002 Palpitations: Secondary | ICD-10-CM

## 2016-04-26 NOTE — Progress Notes (Signed)
Cardiology Office Note  Date:  04/26/2016   ID:  JEFTE SEAWOOD, DOB July 25, 1940, MRN [id number]  PCP:  Leonel Ramsay, MD   Chief Complaint  Patient presents with  . other    1 yr f/u c/o irregular heart beat and elevated BP. Meds reviewed verbally with pt.    HPI:  76 yo male with history of CAD s/p apical MI presents for routine follow-up of his coronary artery disease, h/o CVA and TIA  07/2010, bilateral carotid disease, last evaluated in 2014, right side, 50%, 60% on the left  apical MI in 5/07 with 3 stents placed in the LAD.  Prior history of smoking for 30 years, stopped smoking 25 years ago  In follow-up today he reports feeling well, Wife presents with him and reports that he has underlying dementia He is followed by neurology, [name] He reported having cardiac Fluttering all day last Monday, 2 days ago. He is unclear if it was fast or just palpitating. Reports he did not feel right eventually symptoms resolved on their own.   Previous records and history reviewed with him. Discussed previous stroke history 2012, etiology unclear. He did have moderate carotid disease at that time, no recent evaluation  Does not remember wearing a monitor to rule out atrial fibrillation Reports having occasional chest tightness, not on a regular basis, not very active at baseline  no regular exercise program   Tolerating simvastatin 20 mg daily  blood pressure well controlled at home  Lab work reviewed   hemoglobin A1c 6.8  Total cholesterol 140, LDL 69  EKG on today's visit shows normal sinus rhythm  Rate 67 bpm with new T-wave abnormality V3, aVF  Other past medical history stress test 12/08  showed a partially reversible inferoapical defect similar to prior stress in 12/07.  Echo in 3/11 showed preserved EF.   Previous blood work showed total cholesterol 154, LDL 87, HDL 37, hemoglobin A1c 6.5   PMH:   has a past medical history of Arthritic-like pain; CAD  (coronary artery disease); Diet-controlled type 2 diabetes mellitus ([place]); HLD (hyperlipidemia); and HTN (hypertension).  PSH:    Past Surgical History:  Procedure Laterality Date  . BACK SURGERY    . CARDIAC CATHETERIZATION    . CORONARY ANGIOPLASTY WITH STENT PLACEMENT     apical MI 5/07 with LHC showing 50% pCFX, 30% pRCA, 99% pLAD, 9%% mLAD, 75% dLAD. cypher DES x3 to LAD. ETT-myoview (12/08) 81% MPHR, 8'1, EF 64%, partially reversible inferoapical perfusion defect similar to prior study 12/07    Current Outpatient Prescriptions  Medication Sig Dispense Refill  . amLODipine (NORVASC) 2.5 MG tablet Take 2.5 mg by mouth daily.    Marland Kitchen aspirin 81 MG tablet Take 81 mg by mouth daily.     . clopidogrel (PLAVIX) 75 MG tablet Take 1 tablet (75 mg total) by mouth daily with breakfast. 30 tablet 2  . gabapentin (NEURONTIN) 100 MG capsule Take 100 mg by mouth at bedtime.    Marland Kitchen lisinopril (PRINIVIL,ZESTRIL) 20 MG tablet TAKE 1 TABLET (20 MG TOTAL) BY MOUTH DAILY. 90 tablet 3  . metFORMIN (GLUCOPHAGE) 1000 MG tablet Take 1,000 mg by mouth 2 (two) times daily with a meal.    . metoprolol tartrate (LOPRESSOR) 25 MG tablet TAKE 1 TABLET BY MOUTH TWICE A DAY 180 tablet 3  . simvastatin (ZOCOR) 20 MG tablet TAKE 1 TABLET (20 MG TOTAL) BY MOUTH DAILY. 90 tablet 3  . sertraline (ZOLOFT) 25 MG tablet  Take 1 tablet (25 mg total) by mouth daily.     No current facility-administered medications for this visit.      Allergies:   Patient has no known allergies.   Social History:  The patient  reports that he quit smoking about 31 years ago. His smoking use included Cigarettes. He has a 25.00 pack-year smoking history. He has never used smokeless tobacco. He reports that he does not drink alcohol or use drugs.   Family History:   Family history is unknown by patient.    Review of Systems: Review of Systems  Constitutional: Negative.   Respiratory: Positive for shortness of breath.   Cardiovascular:  Positive for chest pain and palpitations.  Gastrointestinal: Negative.   Musculoskeletal: Negative.   Neurological: Negative.   Psychiatric/Behavioral: Negative.   All other systems reviewed and are negative.    PHYSICAL EXAM: VS:  BP (!) 158/70 (BP Location: Left Arm, Patient Position: Sitting, Cuff Size: Normal)   Pulse 70   Ht 5\' 7"  (1.702 m)   Wt 163 lb 4 oz (74 kg)   BMI 25.57 kg/m  , BMI Body mass index is 25.57 kg/m. GEN: Well nourished, well developed, in no acute distress  HEENT: normal  Neck: no JVD, + b/l carotid bruits, no masses Cardiac: RRR; no murmurs, rubs, or gallops,no edema  Respiratory:  clear to auscultation bilaterally, normal work of breathing GI: soft, nontender, nondistended, + BS MS: no deformity or atrophy  Skin: warm and dry, no rash Neuro:  Strength and sensation are intact Psych: euthymic mood, full affect    Recent Labs: No results found for requested labs within last 8760 hours.    Lipid Panel Lab Results  Component Value Date   CHOL 138 12/28/2012   HDL 34 (L) 12/28/2012   LDLCALC 65 12/28/2012   TRIG 197 12/28/2012      Wt Readings from Last 3 Encounters:  04/26/16 163 lb 4 oz (74 kg)  04/06/15 162 lb 4 oz (73.6 kg)  05/14/14 163 lb 4 oz (74 kg)       ASSESSMENT AND PLAN:  Atherosclerosis of native coronary artery of native heart without angina pectoris -  EKG performed today, we have ordered stress tests He has fluttering, occasional chest tightness, known history of coronary artery disease, unable to treadmill. Previously tried to treadmill, reports he had severe problems with blood pressure, unable to exercise  Atrial flutter, unspecified type ([place]) -  Etiology of palpitations and fluttering is unclear, unable to exclude atrial fibrillation. Given prior history of stroke and arrhythmia symptoms, we have ordered 2 week event monitor  Stenosis of carotid artery, unspecified laterality - It has been several years since  his last carotid ultrasound, repeat ultrasound has been ordered Prior history of stroke  Palpitations - as above, etiology unclear, event monitor ordered to rule out atrial fibrillation, flutter  Abnormal EKG -  Long discussion with him today, new EKG changes, T wave inversion in lead 3, aVF Some chest discomfort, though very sedentary at baseline Myoview has been ordered, unable to treadmill, will attempt pharmacologic study  Essential hypertension Blood pressure elevated today, also recently elevated when he was seen by neurology. He was given prescription for amlodipine 2.50 mg daily by [name], he has not started this yet. Recommended he start medication, suspect he will need higher dose Suggested he monitor blood pressure at home, call our office for systolic pressure gradient 140   Total encounter time more than 45 minutes  Greater than 50% was spent in counseling and coordination of care with the patient   Disposition:   F/U  6 months   Orders Placed This Encounter  Procedures  . NM Myocar Multi W/Spect W/[place] / EF  . LONG TERM MONITOR (3-14 DAYS)  . EKG 12-Lead     Signed, Esmond Plants, M.D., Ph.D. 04/26/2016  [place], [place]

## 2016-04-26 NOTE — Patient Instructions (Addendum)
Medication Instructions:   Please start amlodipine 2.5 daily Monitor BP at home You may need 5 mg pill  Labwork:  No new labs needed  Testing/Procedures:  We will order a carotid u/s for known carotid stenosis We will order a zio monitor for palpitations, fluttering, hx of CVA  We will schedule a pharmacological myoview for fluttering, new abn ekg, known CAD, unable to treadmill  [place]  Your caregiver has ordered a Stress Test with nuclear imaging. The purpose of this test is to evaluate the blood supply to your heart muscle. This procedure is referred to as a "Non-Invasive Stress Test." This is because other than having an IV started in your vein, nothing is inserted or "invades" your body. Cardiac stress tests are done to find areas of poor blood flow to the heart by determining the extent of coronary artery disease (CAD). Some patients exercise on a treadmill, which naturally increases the blood flow to your heart, while others who are  unable to walk on a treadmill due to physical limitations have a pharmacologic/chemical stress agent called Lexiscan . This medicine will mimic walking on a treadmill by temporarily increasing your coronary blood flow.   Please note: these test may take anywhere between 2-4 hours to complete  PLEASE REPORT TO [facility] AT THE FIRST DESK WILL DIRECT YOU WHERE TO GO  Date of Procedure:____Tuesday, January 30_________  Arrival Time for Procedure:_____9:15 am____________  Instructions regarding medication:   __X__:  Hold METOPROLOL the night before and morning of procedure  How to prepare for your Myoview test:  1. Do not eat or drink after midnight 2. No caffeine for 24 hours prior to test 3. No smoking 24 hours prior to test. 4. Your medication may be taken with water.  If your doctor stopped a medication because of this test, do not take that medication. 5. Ladies, please do not wear dresses.  Skirts or  pants are appropriate. Please wear a short sleeve shirt. 6. No perfume, cologne or lotion.  I recommend watching educational videos on topics of interest to you at:       [email or web address].com  Enter code: [email or web address]    Follow-Up: It was a pleasure seeing you in the office today. Please call us if you have new issues that need to be addressed before your next appt.  [phone]  Your physician wants you to follow-up in: 6 months.  You will receive a reminder letter in the mail two months in advance. If you don't receive a letter, please call our office to schedule the follow-up appointment.  If you need a refill on your cardiac medications before your next appointment, please call your pharmacy.    Pharmacologic Stress Electrocardiogram A pharmacologic stress electrocardiogram is a heart (cardiac) test that uses nuclear imaging to evaluate the blood supply to your heart. This test may also be called a pharmacologic stress electrocardiography. Pharmacologic means that a medicine is used to increase your heart rate and blood pressure.  This stress test is done to find areas of poor blood flow to the heart by determining the extent of coronary artery disease (CAD). Some people exercise on a treadmill, which naturally increases the blood flow to the heart. For those people unable to exercise on a treadmill, a medicine is used. This medicine stimulates your heart and will cause your heart to beat harder and more quickly, as if you were exercising.  Pharmacologic stress tests can help determine:  The  adequacy of blood flow to your heart during increased levels of activity in order to clear you for discharge home.  The extent of coronary artery blockage caused by CAD.  Your prognosis if you have suffered a heart attack.  The effectiveness of cardiac procedures done, such as an angioplasty, which can increase the circulation in your coronary arteries.  Causes of chest pain or pressure. LET [facility] CARE PROVIDER KNOW ABOUT:  Any allergies you have.  All medicines you are taking, including vitamins, herbs, eye drops, creams, and over-the-counter medicines.  Previous problems you or members of your family have had with the use of anesthetics.  Any blood disorders you have.  Previous surgeries you have had.  Medical conditions you have.  Possibility of pregnancy, if this applies.  If you are currently breastfeeding. RISKS AND COMPLICATIONS Generally, this is a safe procedure. However, as with any procedure, complications can occur. Possible complications include:  You develop pain or pressure in the following areas:  Chest.  Jaw or neck.  Between your shoulder blades.  Radiating down your left arm.  Headache.  Dizziness or light-headedness.  Shortness of breath.  Increased or irregular heartbeat.  Low blood pressure.  Nausea or vomiting.  Flushing.  Redness going up the arm and slight pain during injection of medicine.  Heart attack (rare). BEFORE THE PROCEDURE   Avoid all forms of caffeine for 24 hours before your test or as directed by your health care provider. This includes coffee, tea (even decaffeinated tea), caffeinated sodas, chocolate, cocoa, and certain pain medicines.  Follow your health care provider's instructions regarding eating and drinking before the test.  Take your medicines as directed at regular times with water unless instructed otherwise. Exceptions may include:  If you have diabetes, ask how you are to take your insulin or pills. It is common to adjust insulin dosing the morning of the test.  If you are taking beta-blocker medicines, it is important to talk to your health care provider about these medicines well before the date of your test. Taking beta-blocker medicines may interfere with the test. In some cases, these medicines need to be changed or stopped 24 hours or more before the test.  If you wear a nitroglycerin  patch, it may need to be removed prior to the test. Ask your health care provider if the patch should be removed before the test.  If you use an inhaler for any breathing condition, bring it with you to the test.  If you are an outpatient, bring a snack so you can eat right after the stress phase of the test.  Do not smoke for 4 hours prior to the test or as directed by your health care provider.  Do not apply lotions, powders, creams, or oils on your chest prior to the test.  Wear comfortable shoes and clothing. Let your health care provider know if you were unable to complete or follow the preparations for your test. PROCEDURE   Multiple patches (electrodes) will be put on your chest. If needed, small areas of your chest may be shaved to get better contact with the electrodes. Once the electrodes are attached to your body, multiple wires will be attached to the electrodes, and your heart rate will be monitored.  An IV access will be started. A nuclear trace (isotope) is given. The isotope may be given intravenously, or it may be swallowed. Nuclear refers to several types of radioactive isotopes, and the nuclear isotope lights up  the arteries so that the nuclear images are clear. The isotope is absorbed by your body. This results in low radiation exposure.  A resting nuclear image is taken to show how your heart functions at rest.  A medicine is given through the IV access.  A second scan is done about 1 hour after the medicine injection and determines how your heart functions under stress.  During this stress phase, you will be connected to an electrocardiogram machine. Your blood pressure and oxygen levels will be monitored. AFTER THE PROCEDURE   Your heart rate and blood pressure will be monitored after the test.  You may return to your normal schedule, including diet,activities, and medicines, unless your health care provider tells you otherwise. This information is not intended  to replace advice given to you by your health care provider. Make sure you discuss any questions you have with your health care provider. Document Released: 08/06/2008 Document Revised: 03/25/2013 Document Reviewed: 11/25/2012 Elsevier Interactive Patient Education  2017 [employer].

## 2016-05-02 ENCOUNTER — Encounter
Admission: RE | Admit: 2016-05-02 | Discharge: 2016-05-02 | Disposition: A | Discharge status: Home / Self Care | Payer: PPO | Source: Ambulatory Visit | Attending: Cardiovascular Disease | Admitting: Cardiovascular Disease

## 2016-05-02 DIAGNOSIS — E118 Type 2 diabetes mellitus with unspecified complications: Secondary | ICD-10-CM | POA: Diagnosis not present

## 2016-05-02 DIAGNOSIS — I4892 Unspecified atrial flutter: Secondary | ICD-10-CM

## 2016-05-02 DIAGNOSIS — R002 Palpitations: Secondary | ICD-10-CM

## 2016-05-02 DIAGNOSIS — R9431 Abnormal electrocardiogram [ECG] [EKG]: Secondary | ICD-10-CM

## 2016-05-02 DIAGNOSIS — I2510 Atherosclerotic heart disease of native coronary artery without angina pectoris: Secondary | ICD-10-CM | POA: Diagnosis not present

## 2016-05-02 DIAGNOSIS — I6529 Occlusion and stenosis of unspecified carotid artery: Secondary | ICD-10-CM

## 2016-05-02 LAB — NM MYOCAR MULTI W/SPECT W/WALL MOTION / EF
CHL CUP RESTING HR STRESS: 71 {beats}/min
CHL CUP STRESS STAGE 1 GRADE: 0 %
CHL CUP STRESS STAGE 1 SPEED: 0 mph
CHL CUP STRESS STAGE 2 SPEED: 0 mph
CHL CUP STRESS STAGE 3 HR: 87 {beats}/min
CHL CUP STRESS STAGE 4 SPEED: 0 mph
CHL CUP STRESS STAGE 5 GRADE: 0 %
CHL CUP STRESS STAGE 5 HR: 94 {beats}/min
CHL CUP STRESS STAGE 5 SBP: 171 mmHg
CSEPEW: 1 METS
CSEPPMHR: 60 %
LV dias vol: 42 mL (ref 62–150)
LV sys vol: 15 mL
Peak HR: 87 {beats}/min
Percent HR: 68 %
SDS: 0
SRS: 1
SSS: 0
Stage 1 HR: 71 {beats}/min
Stage 2 Grade: 0 %
Stage 2 HR: 71 {beats}/min
Stage 3 Grade: 0 %
Stage 3 Speed: 0 mph
Stage 4 Grade: 0 %
Stage 4 HR: 96 {beats}/min
Stage 5 DBP: 73 mmHg
Stage 5 Speed: 0 mph
TID: 1.39

## 2016-05-02 MED ORDER — TECHNETIUM TC 99M TETROFOSMIN IV KIT
29.8800 | PACK | Freq: Once | INTRAVENOUS | Status: AC | PRN
  Administered 2016-05-02: 29.88 via INTRAVENOUS

## 2016-05-02 MED ORDER — REGADENOSON 0.4 MG/5ML IV SOLN
0.4000 mg | Freq: Once | INTRAVENOUS | Status: AC
  Administered 2016-05-02: 0.4 mg via INTRAVENOUS
  Filled 2016-05-02: qty 5

## 2016-05-02 MED ORDER — TECHNETIUM TC 99M TETROFOSMIN IV KIT
11.7400 | PACK | Freq: Once | INTRAVENOUS | Status: AC | PRN
  Administered 2016-05-02: 11.74 via INTRAVENOUS

## 2016-05-12 ENCOUNTER — Ambulatory Visit: Payer: PPO

## 2016-05-12 ENCOUNTER — Ambulatory Visit (INDEPENDENT_AMBULATORY_CARE_PROVIDER_SITE_OTHER): Payer: PPO

## 2016-05-12 DIAGNOSIS — R9431 Abnormal electrocardiogram [ECG] [EKG]: Secondary | ICD-10-CM

## 2016-05-12 DIAGNOSIS — I2510 Atherosclerotic heart disease of native coronary artery without angina pectoris: Secondary | ICD-10-CM

## 2016-05-12 DIAGNOSIS — I6529 Occlusion and stenosis of unspecified carotid artery: Secondary | ICD-10-CM

## 2016-05-12 DIAGNOSIS — R002 Palpitations: Secondary | ICD-10-CM

## 2016-05-12 DIAGNOSIS — I4892 Unspecified atrial flutter: Secondary | ICD-10-CM | POA: Diagnosis not present

## 2016-05-12 DIAGNOSIS — E118 Type 2 diabetes mellitus with unspecified complications: Secondary | ICD-10-CM

## 2016-05-12 DIAGNOSIS — I6523 Occlusion and stenosis of bilateral carotid arteries: Secondary | ICD-10-CM | POA: Diagnosis not present

## 2016-05-24 DIAGNOSIS — I10 Essential (primary) hypertension: Secondary | ICD-10-CM | POA: Diagnosis not present

## 2016-05-24 DIAGNOSIS — E119 Type 2 diabetes mellitus without complications: Secondary | ICD-10-CM | POA: Diagnosis not present

## 2016-05-24 DIAGNOSIS — E7800 Pure hypercholesterolemia, unspecified: Secondary | ICD-10-CM | POA: Diagnosis not present

## 2016-05-26 ENCOUNTER — Other Ambulatory Visit: Payer: Self-pay | Admitting: Cardiovascular Disease

## 2016-05-31 DIAGNOSIS — I259 Chronic ischemic heart disease, unspecified: Secondary | ICD-10-CM | POA: Diagnosis not present

## 2016-05-31 DIAGNOSIS — I10 Essential (primary) hypertension: Secondary | ICD-10-CM | POA: Diagnosis not present

## 2016-05-31 DIAGNOSIS — E7800 Pure hypercholesterolemia, unspecified: Secondary | ICD-10-CM | POA: Diagnosis not present

## 2016-05-31 DIAGNOSIS — E538 Deficiency of other specified B group vitamins: Secondary | ICD-10-CM | POA: Diagnosis not present

## 2016-05-31 DIAGNOSIS — E119 Type 2 diabetes mellitus without complications: Secondary | ICD-10-CM | POA: Diagnosis not present

## 2016-05-31 DIAGNOSIS — R413 Other amnesia: Secondary | ICD-10-CM | POA: Diagnosis not present

## 2016-06-02 DIAGNOSIS — I4892 Unspecified atrial flutter: Secondary | ICD-10-CM | POA: Diagnosis not present

## 2016-08-11 ENCOUNTER — Telehealth: Payer: Self-pay | Admitting: Cardiovascular Disease

## 2016-08-11 MED ORDER — AMLODIPINE BESYLATE 5 MG PO TABS: 5.0000 mg | ORAL_TABLET | Freq: Every day | ORAL | 3 refills | Status: DC

## 2016-08-11 NOTE — Telephone Encounter (Signed)
Spoke w/ pt's wife. She reports that after pt's last ov, he increased his amlodipine to 5 mg b/c of his HA. Since then, he has been feeling good, but she does not check his BP at home. 90 day supply of amlodipine 5 mg sent to pt's pharmacy. Asked her to call back w/ any further questions or concerns.

## 2016-08-11 NOTE — Telephone Encounter (Signed)
Patient's wife calling about amolodopine 25 mg and that Dr. Rockey Situ told them to increase it to 50mg .  They will need a new prescription called in for a 90 supply to [facility] if Dr. Rockey Situ wants him to increase the dose.

## 2016-10-21 ENCOUNTER — Other Ambulatory Visit: Payer: Self-pay | Admitting: Cardiovascular Disease

## 2016-11-01 ENCOUNTER — Ambulatory Visit (INDEPENDENT_AMBULATORY_CARE_PROVIDER_SITE_OTHER): Payer: PPO | Admitting: Nurse Practitioner

## 2016-11-01 ENCOUNTER — Encounter: Payer: Self-pay | Admitting: Nurse Practitioner

## 2016-11-01 VITALS — BP 146/70 | HR 73 | Ht 68.0 in | Wt 165.5 lb

## 2016-11-01 DIAGNOSIS — I2510 Atherosclerotic heart disease of native coronary artery without angina pectoris: Secondary | ICD-10-CM

## 2016-11-01 DIAGNOSIS — E782 Mixed hyperlipidemia: Secondary | ICD-10-CM | POA: Diagnosis not present

## 2016-11-01 DIAGNOSIS — I10 Essential (primary) hypertension: Secondary | ICD-10-CM | POA: Diagnosis not present

## 2016-11-01 MED ORDER — AMLODIPINE BESYLATE 10 MG PO TABS: 10.0000 mg | ORAL_TABLET | Freq: Every day | ORAL | 6 refills | Status: DC

## 2016-11-01 NOTE — Patient Instructions (Signed)
Medication Instructions:  Your physician has recommended you make the following change in your medication:  1. INCREASE Amlodipine to 10 mg once daily   Follow-Up: Your physician wants you to follow-up in: 6 months with Dr. Rockey Situ. You will receive a reminder letter in the mail two months in advance. If you don't receive a letter, please call our office to schedule the follow-up appointment.  It was a pleasure seeing you today here in the office. Please do not hesitate to give [place] a call back if you have any further questions. [place], BSN

## 2016-11-01 NOTE — Progress Notes (Signed)
Office Visit    Patient Name: Raymond Sampson Date of Encounter: 11/01/2016  Primary Care Provider:  Leonel Ramsay, MD Primary Cardiologist:  Johnny Bridge, MD   Chief Complaint    76 year old male with prior history of CAD, hypertension, hyperlipidemia, diabetes, and stroke, who presents for follow-up.  Past Medical History    Past Medical History:  Diagnosis Date  . Arthritic-like pain   . CAD (coronary artery disease)    a. apical MI 5/07 with LHC showing 50% pCFX, 30% pRCA, 99% pLAD, 99% mLAD, 75% dLAD (cypher DES x3 to LAD); b. ETT-myoview (12/08) 81% MPHR,  EF 64%, partially reversible inferoapical perfusion defect similar to prior study 12/07; c. 04/2016 MV: EF 59%, fixed anteroapical defect, no ischemia->Low risk.  . Carotid arterial disease ([place])    a. 05/2016 Carotid U/S: 30-40% bilat dzs, f/u in 2 yrs.  . CVA (cerebral vascular accident) ([place])    a. 07/2010 - h/o CVA/TIA.  . Diet-controlled type 2 diabetes mellitus ([place])   . HLD (hyperlipidemia)   . HTN (hypertension)   . PAT (paroxysmal atrial tachycardia) ([place])    a. 06/2016 Event monitor:  rare episodes of atrial tachycardia, longest 7 beats. No afib.   Past Surgical History:  Procedure Laterality Date  . BACK SURGERY    . CARDIAC CATHETERIZATION    . CORONARY ANGIOPLASTY WITH STENT PLACEMENT     apical MI 5/07 with LHC showing 50% pCFX, 30% pRCA, 99% pLAD, 9%% mLAD, 75% dLAD. cypher DES x3 to LAD. ETT-myoview (12/08) 81% MPHR, 8'1, EF 64%, partially reversible inferoapical perfusion defect similar to prior study 12/07    Allergies  No Known Allergies  History of Present Illness    89 rolled male with prior history of CAD status post myocardial infarction May 2007 requiring 3.drug-eluting stents to the LAD. Other history includes stroke/TIA in April 2012 with moderate, stable carotid arterial disease by ultrasound and February 2018. He also has hypertension, hyperlipidemia, and diabetes. He was last seen  here in January, at which time he reported palpitations. Event monitoring showed rare episodes of atrial tachycardia without any atrial fibrillation. He also was noted to have abnormal EKG and stress testing was performed and was low risk. Since his last visit, he has done well. He tries to remain active around his house and takes care of his 2 dogs. He walks sometimes. In general, he doesn't feel like he has any significant limitations with activity. He denies chest pain, palpitations, dyspnea, PND, orthopnea, dizziness, syncope, edema, or early satiety.  Home Medications    Prior to Admission medications   Medication Sig Start Date End Date Taking? Authorizing Provider  aspirin 81 MG tablet Take 81 mg by mouth daily.    Yes [provider]  clopidogrel (PLAVIX) 75 MG tablet Take 1 tablet (75 mg total) by mouth daily with breakfast. 02/24/13  Yes Gollan, Kathlene November, MD  gabapentin (NEURONTIN) 100 MG capsule Take 100 mg by mouth at bedtime.   Yes [provider]  lisinopril (PRINIVIL,ZESTRIL) 20 MG tablet TAKE 1 TABLET (20 MG TOTAL) BY MOUTH DAILY. 10/23/16  Yes Gollan, Kathlene November, MD  metFORMIN (GLUCOPHAGE) 1000 MG tablet Take 1,000 mg by mouth 2 (two) times daily with a meal.   Yes [provider]  metoprolol tartrate (LOPRESSOR) 25 MG tablet TAKE 1 TABLET BY MOUTH TWICE A DAY 05/26/16  Yes Gollan, Kathlene November, MD  sertraline (ZOLOFT) 25 MG tablet Take 1 tablet (25 mg total)  by mouth daily. 04/26/16  Yes Gollan, Kathlene November, MD  simvastatin (ZOCOR) 20 MG tablet TAKE 1 TABLET (20 MG TOTAL) BY MOUTH DAILY. 11/24/15  Yes Minna Merritts, MD  amLODipine (NORVASC) 10 MG tablet Take 1 tablet (10 mg total) by mouth daily. 11/01/16 01/30/17  Rogelia Mire, NP    Review of Systems    As above, he has been doing well.  He denies chest pain, palpitations, dyspnea, pnd, orthopnea, n, v, dizziness, syncope, edema, weight gain, or early satiety.  All other systems reviewed and are  otherwise negative except as noted above.  Physical Exam    VS:  BP (!) 146/70 (BP Location: Left Arm, Patient Position: Sitting, Cuff Size: Normal)   Pulse 73   Ht 5\' 8"  (1.727 m)   Wt 165 lb 8 oz (75.1 kg)   BMI 25.16 kg/m  , BMI Body mass index is 25.16 kg/m. GEN: Well nourished, well developed, in no acute distress.  HEENT: normal.  Neck: Supple, no JVD, carotid bruits, or masses. Cardiac: RRR, no murmurs, rubs, or gallops. No clubbing, cyanosis, edema.  Radials/DP/PT 2+ and equal bilaterally.  Respiratory:  Respirations regular and unlabored, clear to auscultation bilaterally. GI: Soft, nontender, nondistended, BS + x 4. MS: no deformity or atrophy. Skin: warm and dry, no rash. Neuro:  Strength and sensation are intact. Psych: Normal affect.  Accessory Clinical Findings    ECG - Regular sinus rhythm, PAC, no acute ST or T changes.  Assessment & Plan    1.  Coronary artery disease: Patient has been doing well without chest pain or dyspnea. He had a nonischemic Myoview earlier this year. He remains on aspirin, Plavix, beta blocker, ACE inhibitor, and statin therapy.  2. Essential hypertension: Blood pressure is elevated today. I repeated this and got 148/70. I have increased his amlodipine to 10 mg daily.  3. Hyperlipidemia: This is followed by primary care and his LDL was 70 in February.  He remains on statin therapy.  4. Palpitations/paroxysmal atrial tachycardia: No recurrent palpitations. Brief episodes of atrial tachycardia noted on event monitoring earlier this year. Continue beta blocker.  5. Type 2 diabetes mellitus: He is on metformin therapy and this is followed by primary care.  6. Carotid arterial disease: Stable bilateral ultrasound earlier this year. Follow-up in 2 years.  7. Disposition: Follow-up with Dr. Rockey Situ in 6 months or sooner if necessary.  Murray Hodgkins, NP 11/01/2016, 5:19 PM

## 2016-11-07 ENCOUNTER — Other Ambulatory Visit: Payer: Self-pay | Admitting: Cardiovascular Disease

## 2016-11-21 DIAGNOSIS — E7800 Pure hypercholesterolemia, unspecified: Secondary | ICD-10-CM | POA: Diagnosis not present

## 2016-11-21 DIAGNOSIS — I10 Essential (primary) hypertension: Secondary | ICD-10-CM | POA: Diagnosis not present

## 2016-11-21 DIAGNOSIS — E119 Type 2 diabetes mellitus without complications: Secondary | ICD-10-CM | POA: Diagnosis not present

## 2016-11-28 DIAGNOSIS — E119 Type 2 diabetes mellitus without complications: Secondary | ICD-10-CM | POA: Diagnosis not present

## 2016-11-28 DIAGNOSIS — Z0000 Encounter for general adult medical examination without abnormal findings: Secondary | ICD-10-CM | POA: Diagnosis not present

## 2016-11-28 DIAGNOSIS — E538 Deficiency of other specified B group vitamins: Secondary | ICD-10-CM | POA: Diagnosis not present

## 2016-11-28 DIAGNOSIS — I10 Essential (primary) hypertension: Secondary | ICD-10-CM | POA: Diagnosis not present

## 2016-11-28 DIAGNOSIS — G250 Essential tremor: Secondary | ICD-10-CM | POA: Diagnosis not present

## 2016-11-28 DIAGNOSIS — E7800 Pure hypercholesterolemia, unspecified: Secondary | ICD-10-CM | POA: Diagnosis not present

## 2016-11-28 DIAGNOSIS — R413 Other amnesia: Secondary | ICD-10-CM | POA: Diagnosis not present

## 2017-03-23 DIAGNOSIS — E119 Type 2 diabetes mellitus without complications: Secondary | ICD-10-CM | POA: Diagnosis not present

## 2017-04-04 DIAGNOSIS — E7800 Pure hypercholesterolemia, unspecified: Secondary | ICD-10-CM | POA: Diagnosis not present

## 2017-04-04 DIAGNOSIS — I10 Essential (primary) hypertension: Secondary | ICD-10-CM | POA: Diagnosis not present

## 2017-04-04 DIAGNOSIS — E119 Type 2 diabetes mellitus without complications: Secondary | ICD-10-CM | POA: Diagnosis not present

## 2017-05-15 ENCOUNTER — Other Ambulatory Visit: Payer: Self-pay | Admitting: Cardiovascular Disease

## 2017-06-05 ENCOUNTER — Inpatient Hospital Stay
Admission: EM | Admit: 2017-06-05 | Discharge: 2017-06-07 | DRG: 153 | Disposition: A | Discharge status: Home Health | Payer: PPO | Attending: Family Medicine | Admitting: Family Medicine

## 2017-06-05 ENCOUNTER — Emergency Department: Payer: PPO

## 2017-06-05 ENCOUNTER — Encounter: Payer: Self-pay | Admitting: Emergency Medicine

## 2017-06-05 DIAGNOSIS — E785 Hyperlipidemia, unspecified: Secondary | ICD-10-CM | POA: Diagnosis present

## 2017-06-05 DIAGNOSIS — Z7982 Long term (current) use of aspirin: Secondary | ICD-10-CM | POA: Diagnosis not present

## 2017-06-05 DIAGNOSIS — I2510 Atherosclerotic heart disease of native coronary artery without angina pectoris: Secondary | ICD-10-CM | POA: Diagnosis not present

## 2017-06-05 DIAGNOSIS — E119 Type 2 diabetes mellitus without complications: Secondary | ICD-10-CM | POA: Diagnosis not present

## 2017-06-05 DIAGNOSIS — E871 Hypo-osmolality and hyponatremia: Secondary | ICD-10-CM | POA: Diagnosis not present

## 2017-06-05 DIAGNOSIS — S199XXA Unspecified injury of neck, initial encounter: Secondary | ICD-10-CM | POA: Diagnosis not present

## 2017-06-05 DIAGNOSIS — I471 Supraventricular tachycardia: Secondary | ICD-10-CM | POA: Diagnosis present

## 2017-06-05 DIAGNOSIS — Z7984 Long term (current) use of oral hypoglycemic drugs: Secondary | ICD-10-CM | POA: Diagnosis not present

## 2017-06-05 DIAGNOSIS — M545 Low back pain: Secondary | ICD-10-CM | POA: Diagnosis present

## 2017-06-05 DIAGNOSIS — Z79899 Other long term (current) drug therapy: Secondary | ICD-10-CM | POA: Diagnosis not present

## 2017-06-05 DIAGNOSIS — W1830XA Fall on same level, unspecified, initial encounter: Secondary | ICD-10-CM | POA: Diagnosis present

## 2017-06-05 DIAGNOSIS — W19XXXA Unspecified fall, initial encounter: Secondary | ICD-10-CM

## 2017-06-05 DIAGNOSIS — S0990XA Unspecified injury of head, initial encounter: Secondary | ICD-10-CM | POA: Diagnosis not present

## 2017-06-05 DIAGNOSIS — Z8673 Personal history of transient ischemic attack (TIA), and cerebral infarction without residual deficits: Secondary | ICD-10-CM | POA: Diagnosis not present

## 2017-06-05 DIAGNOSIS — Z955 Presence of coronary angioplasty implant and graft: Secondary | ICD-10-CM

## 2017-06-05 DIAGNOSIS — S299XXA Unspecified injury of thorax, initial encounter: Secondary | ICD-10-CM | POA: Diagnosis not present

## 2017-06-05 DIAGNOSIS — Z7902 Long term (current) use of antithrombotics/antiplatelets: Secondary | ICD-10-CM

## 2017-06-05 DIAGNOSIS — R531 Weakness: Secondary | ICD-10-CM | POA: Diagnosis not present

## 2017-06-05 DIAGNOSIS — Y92009 Unspecified place in unspecified non-institutional (private) residence as the place of occurrence of the external cause: Secondary | ICD-10-CM

## 2017-06-05 DIAGNOSIS — E860 Dehydration: Secondary | ICD-10-CM | POA: Diagnosis not present

## 2017-06-05 DIAGNOSIS — R262 Difficulty in walking, not elsewhere classified: Secondary | ICD-10-CM

## 2017-06-05 DIAGNOSIS — R410 Disorientation, unspecified: Secondary | ICD-10-CM | POA: Diagnosis not present

## 2017-06-05 DIAGNOSIS — I252 Old myocardial infarction: Secondary | ICD-10-CM

## 2017-06-05 DIAGNOSIS — J111 Influenza due to unidentified influenza virus with other respiratory manifestations: Secondary | ICD-10-CM | POA: Diagnosis not present

## 2017-06-05 DIAGNOSIS — I10 Essential (primary) hypertension: Secondary | ICD-10-CM | POA: Diagnosis present

## 2017-06-05 DIAGNOSIS — Z87891 Personal history of nicotine dependence: Secondary | ICD-10-CM

## 2017-06-05 LAB — URINALYSIS, COMPLETE (UACMP) WITH MICROSCOPIC
BILIRUBIN URINE: NEGATIVE
Bacteria, UA: NONE SEEN
Glucose, UA: >=500 mg/dL — AB
Ketones, ur: 5 mg/dL — AB
Leukocytes, UA: NEGATIVE
Nitrite: NEGATIVE
PH: 5 (ref 5.0–8.0)
Protein, ur: 100 mg/dL — AB
Specific Gravity, Urine: 1.023 (ref 1.005–1.030)
WBC UA: NONE SEEN WBC/hpf (ref 0–5)

## 2017-06-05 LAB — COMPREHENSIVE METABOLIC PANEL
ALBUMIN: 4.6 g/dL (ref 3.5–5.0)
ALT: 20 U/L (ref 17–63)
AST: 52 U/L — AB (ref 15–41)
Alkaline Phosphatase: 58 U/L (ref 38–126)
Anion gap: 11 (ref 5–15)
BILIRUBIN TOTAL: 1.1 mg/dL (ref 0.3–1.2)
BUN: 23 mg/dL — AB (ref 6–20)
CHLORIDE: 98 mmol/L — AB (ref 101–111)
CO2: 25 mmol/L (ref 22–32)
Calcium: 9.4 mg/dL (ref 8.9–10.3)
Creatinine, Ser: 1.16 mg/dL (ref 0.61–1.24)
GFR calc Af Amer: >60 mL/min (ref >60)
GFR calc non Af Amer: 59 mL/min — ABNORMAL LOW (ref >60)
GLUCOSE: 185 mg/dL — AB (ref 65–99)
POTASSIUM: 4.1 mmol/L (ref 3.5–5.1)
Sodium: 134 mmol/L — ABNORMAL LOW (ref 135–145)
Total Protein: 8.4 g/dL — ABNORMAL HIGH (ref 6.5–8.1)

## 2017-06-05 LAB — TROPONIN I
TROPONIN I: 0.03 ng/mL — AB (ref <0.03)
TROPONIN I: 0.03 ng/mL — AB (ref <0.03)

## 2017-06-05 LAB — CBC
HEMATOCRIT: 41.1 % (ref 40.0–52.0)
Hemoglobin: 13.8 g/dL (ref 13.0–18.0)
MCH: 30.2 pg (ref 26.0–34.0)
MCHC: 33.7 g/dL (ref 32.0–36.0)
MCV: 89.7 fL (ref 80.0–100.0)
Platelets: 256 10*3/uL (ref 150–440)
RBC: 4.58 MIL/uL (ref 4.40–5.90)
RDW: 13.7 % (ref 11.5–14.5)
WBC: 7.9 10*3/uL (ref 3.8–10.6)

## 2017-06-05 LAB — INFLUENZA PANEL BY PCR (TYPE A & B)
Influenza A By PCR: POSITIVE — AB
Influenza B By PCR: NEGATIVE

## 2017-06-05 MED ORDER — AMLODIPINE BESYLATE 10 MG PO TABS
10.0000 mg | ORAL_TABLET | Freq: Every evening | ORAL | Status: DC
  Administered 2017-06-05 – 2017-06-06 (×2): 10 mg via ORAL
  Filled 2017-06-05 (×2): qty 1

## 2017-06-05 MED ORDER — ONDANSETRON HCL 4 MG/2ML IJ SOLN: 4.0000 mg | Freq: Four times a day (QID) | INTRAMUSCULAR | Status: DC | PRN

## 2017-06-05 MED ORDER — OSELTAMIVIR PHOSPHATE 30 MG PO CAPS: 30.0000 mg | ORAL_CAPSULE | Freq: Two times a day (BID) | ORAL | Status: DC

## 2017-06-05 MED ORDER — ACETAMINOPHEN 325 MG PO TABS: 650.0000 mg | ORAL_TABLET | Freq: Four times a day (QID) | ORAL | Status: DC | PRN

## 2017-06-05 MED ORDER — CLOPIDOGREL BISULFATE 75 MG PO TABS
75.0000 mg | ORAL_TABLET | Freq: Every day | ORAL | Status: DC
  Administered 2017-06-06 – 2017-06-07 (×2): 75 mg via ORAL
  Filled 2017-06-05 (×2): qty 1

## 2017-06-05 MED ORDER — ACETAMINOPHEN 650 MG RE SUPP: 650.0000 mg | Freq: Four times a day (QID) | RECTAL | Status: DC | PRN

## 2017-06-05 MED ORDER — METOPROLOL TARTRATE 25 MG PO TABS
25.0000 mg | ORAL_TABLET | Freq: Two times a day (BID) | ORAL | Status: DC
  Administered 2017-06-05 – 2017-06-07 (×4): 25 mg via ORAL
  Filled 2017-06-05 (×4): qty 1

## 2017-06-05 MED ORDER — SERTRALINE HCL 50 MG PO TABS
25.0000 mg | ORAL_TABLET | Freq: Every evening | ORAL | Status: DC
  Administered 2017-06-05 – 2017-06-06 (×2): 25 mg via ORAL
  Filled 2017-06-05 (×2): qty 1

## 2017-06-05 MED ORDER — OSELTAMIVIR PHOSPHATE 30 MG PO CAPS
30.0000 mg | ORAL_CAPSULE | Freq: Two times a day (BID) | ORAL | Status: DC
  Administered 2017-06-05 – 2017-06-07 (×4): 30 mg via ORAL
  Filled 2017-06-05 (×5): qty 1

## 2017-06-05 MED ORDER — LISINOPRIL 20 MG PO TABS
20.0000 mg | ORAL_TABLET | Freq: Every day | ORAL | Status: DC
  Administered 2017-06-06 – 2017-06-07 (×2): 20 mg via ORAL
  Filled 2017-06-05 (×2): qty 1

## 2017-06-05 MED ORDER — GABAPENTIN 100 MG PO CAPS
100.0000 mg | ORAL_CAPSULE | Freq: Every day | ORAL | Status: DC
  Administered 2017-06-05 – 2017-06-06 (×2): 100 mg via ORAL
  Filled 2017-06-05 (×2): qty 1

## 2017-06-05 MED ORDER — OSELTAMIVIR PHOSPHATE 75 MG PO CAPS: 75.0000 mg | ORAL_CAPSULE | Freq: Two times a day (BID) | ORAL | Status: DC

## 2017-06-05 MED ORDER — SODIUM CHLORIDE 0.9 % IV SOLN
INTRAVENOUS | Status: DC
  Administered 2017-06-05: 75 mL/h via INTRAVENOUS
  Administered 2017-06-07: via INTRAVENOUS

## 2017-06-05 MED ORDER — ASPIRIN EC 81 MG PO TBEC
81.0000 mg | DELAYED_RELEASE_TABLET | Freq: Every evening | ORAL | Status: DC
  Administered 2017-06-05 – 2017-06-06 (×2): 81 mg via ORAL
  Filled 2017-06-05 (×2): qty 1

## 2017-06-05 MED ORDER — SENNOSIDES-DOCUSATE SODIUM 8.6-50 MG PO TABS: 1.0000 | ORAL_TABLET | Freq: Every evening | ORAL | Status: DC | PRN

## 2017-06-05 MED ORDER — ONDANSETRON HCL 4 MG PO TABS: 4.0000 mg | ORAL_TABLET | Freq: Four times a day (QID) | ORAL | Status: DC | PRN

## 2017-06-05 MED ORDER — ENOXAPARIN SODIUM 40 MG/0.4ML ~~LOC~~ SOLN
40.0000 mg | SUBCUTANEOUS | Status: DC
  Administered 2017-06-05 – 2017-06-06 (×2): 40 mg via SUBCUTANEOUS
  Filled 2017-06-05 (×2): qty 0.4

## 2017-06-05 MED ORDER — SIMVASTATIN 20 MG PO TABS
20.0000 mg | ORAL_TABLET | Freq: Every evening | ORAL | Status: DC
  Administered 2017-06-05 – 2017-06-06 (×2): 20 mg via ORAL
  Filled 2017-06-05 (×2): qty 1

## 2017-06-05 NOTE — ED Triage Notes (Signed)
Pt referred to ED from walk in clinic. Pt had an unwitnessed fall yesterday. Pt is now unable to walk w/o assistance and confused. Pt has hx of stroke and TIA.

## 2017-06-05 NOTE — ED Notes (Signed)
Pt unable to walk with assistance. Pt very weak and unsteady on his feet.

## 2017-06-05 NOTE — H&P (Signed)
[facility] [place] NAME: Raymond Sampson    MR#:  [id number]  DATE OF BIRTH:  October 25, 1940  DATE OF ADMISSION:  06/05/2017  PRIMARY CARE PHYSICIAN: Leonel Ramsay, MD   REQUESTING/REFERRING PHYSICIAN:   CHIEF COMPLAINT:   Chief Complaint  Patient presents with  . Fall    HISTORY OF PRESENT ILLNESS: Raymond Sampson  is a 77 y.o. male with a known history of coronary artery disease, carotid arterial disease hyperlipidemia, hypertension presented to the emergency room for fall at home.  Patient legs felt weak and they gave away.  No history of any head injury.  No loss of consciousness.  He has cough and congestion and body aches going on for the last few days.  Flu test was positive in the emergency roompatient was given Tamiflu..Imaging studies did not reveal any fracture .  Patient unable to get up and ambulate because of weakness. Hospitalist service was consulted for further care.  PAST MEDICAL HISTORY:   Past Medical History:  Diagnosis Date  . Arthritic-like pain   . CAD (coronary artery disease)    a. apical MI 5/07 with LHC showing 50% pCFX, 30% pRCA, 99% pLAD, 99% mLAD, 75% dLAD (cypher DES x3 to LAD); b. ETT-myoview (12/08) 81% MPHR,  EF 64%, partially reversible inferoapical perfusion defect similar to prior study 12/07; c. 04/2016 MV: EF 59%, fixed anteroapical defect, no ischemia->Low risk.  . Carotid arterial disease ([place])    a. 05/2016 Carotid U/S: 30-40% bilat dzs, f/u in 2 yrs.  . CVA (cerebral vascular accident) ([place])    a. 07/2010 - h/o CVA/TIA.  . Diet-controlled type 2 diabetes mellitus ([place])   . HLD (hyperlipidemia)   . HTN (hypertension)   . PAT (paroxysmal atrial tachycardia) ([place])    a. 06/2016 Event monitor:  rare episodes of atrial tachycardia, longest 7 beats. No afib.    PAST SURGICAL HISTORY:  Past Surgical History:  Procedure Laterality Date  . BACK SURGERY    . CARDIAC CATHETERIZATION    . CORONARY  ANGIOPLASTY WITH STENT PLACEMENT     apical MI 5/07 with LHC showing 50% pCFX, 30% pRCA, 99% pLAD, 9%% mLAD, 75% dLAD. cypher DES x3 to LAD. ETT-myoview (12/08) 81% MPHR, 8'1, EF 64%, partially reversible inferoapical perfusion defect similar to prior study 12/07    SOCIAL HISTORY:  Social History   Tobacco Use  . Smoking status: Former Smoker    Packs/day: 1.00    Years: 25.00    Pack years: 25.00    Types: Cigarettes    Last attempt to quit: 04/17/1985    Years since quitting: 32.1  . Smokeless tobacco: Never Used  Substance Use Topics  . Alcohol use: No    FAMILY HISTORY:  Family History  Family history unknown: Yes    DRUG ALLERGIES: No Known Allergies  REVIEW OF SYSTEMS:   CONSTITUTIONAL: No fever, has weakness.  EYES: No blurred or double vision.  EARS, NOSE, AND THROAT: No tinnitus or ear pain.  RESPIRATORY: No cough, shortness of breath, wheezing or hemoptysis.  CARDIOVASCULAR: No chest pain, orthopnea, edema.  GASTROINTESTINAL: No nausea, vomiting, diarrhea or abdominal pain.  GENITOURINARY: No dysuria, hematuria.  ENDOCRINE: No polyuria, nocturia,  HEMATOLOGY: No anemia, easy bruising or bleeding SKIN: No rash or lesion. MUSCULOSKELETAL: No joint pain or arthritis.   NEUROLOGIC: No tingling, numbness, weakness.  PSYCHIATRY: No anxiety or depression.   MEDICATIONS AT HOME:  Prior to Admission medications  Medication Sig Start Date End Date Taking? Authorizing Provider  amLODipine (NORVASC) 10 MG tablet Take 1 tablet (10 mg total) by mouth daily. 11/01/16 06/05/17 Yes Theora Gianotti, NP  aspirin 81 MG tablet Take 81 mg by mouth daily.    Yes [provider]  clopidogrel (PLAVIX) 75 MG tablet Take 1 tablet (75 mg total) by mouth daily with breakfast. 02/24/13  Yes Gollan, Kathlene November, MD  cyanocobalamin 1000 MCG tablet Take 1,000 mcg by mouth daily.   Yes [provider]  gabapentin (NEURONTIN) 100 MG capsule Take 100 mg by mouth at  bedtime.   Yes [provider]  lisinopril (PRINIVIL,ZESTRIL) 20 MG tablet Take 20 mg by mouth daily.   Yes [provider]  metFORMIN (GLUCOPHAGE) 1000 MG tablet Take 1,000 mg by mouth 2 (two) times daily with a meal.   Yes [provider]  metoprolol tartrate (LOPRESSOR) 25 MG tablet Take 25 mg by mouth 2 (two) times daily.   Yes [provider]  sertraline (ZOLOFT) 25 MG tablet Take 1 tablet (25 mg total) by mouth daily. 04/26/16  Yes Gollan, Kathlene November, MD  simvastatin (ZOCOR) 20 MG tablet TAKE 1 TABLET (20 MG TOTAL) BY MOUTH DAILY. 11/07/16  Yes Gollan, Kathlene November, MD      PHYSICAL EXAMINATION:   VITAL SIGNS: Blood pressure (!) 172/79, pulse 85, temperature 99.8 F (37.7 C), temperature source Oral, resp. rate 19, height 5\' 7"  (1.702 m), weight 74.8 kg (165 lb), SpO2 92 %.  GENERAL:  77 y.o.-year-old patient lying in the bed with no acute distress.  EYES: Pupils equal, round, reactive to light and accommodation. No scleral icterus. Extraocular muscles intact.  HEENT: Head atraumatic, normocephalic. Oropharynx dry and nasopharynx clear.  NECK:  Supple, no jugular venous distention. No thyroid enlargement, no tenderness.  LUNGS: Normal breath sounds bilaterally, no wheezing, rales,rhonchi or crepitation. No use of accessory muscles of respiration.  CARDIOVASCULAR: S1, S2 normal. No murmurs, rubs, or gallops.  ABDOMEN: Soft, nontender, nondistended. Bowel sounds present. No organomegaly or mass.  EXTREMITIES: No pedal edema, cyanosis, or clubbing.  NEUROLOGIC: Cranial nerves II through XII are intact. Muscle strength 5/5 in all extremities. Sensation intact. Gait not checked.  PSYCHIATRIC: The patient is alert and oriented x 3.  SKIN: No obvious rash, lesion, or ulcer.   LABORATORY PANEL:   CBC Recent Labs  Lab 06/05/17 1025  WBC 7.9  HGB 13.8  HCT 41.1  PLT 256  MCV 89.7  MCH 30.2  MCHC 33.7  RDW 13.7    ------------------------------------------------------------------------------------------------------------------  Chemistries  Recent Labs  Lab 06/05/17 1025  NA 134*  K 4.1  CL 98*  CO2 25  GLUCOSE 185*  BUN 23*  CREATININE 1.16  CALCIUM 9.4  AST 52*  ALT 20  ALKPHOS 58  BILITOT 1.1   ------------------------------------------------------------------------------------------------------------------ estimated creatinine clearance is 50.7 mL/min (by C-G formula based on SCr of 1.16 mg/dL). ------------------------------------------------------------------------------------------------------------------ No results for input(s): TSH, T4TOTAL, T3FREE, THYROIDAB in the last 72 hours.  Invalid input(s): FREET3   Coagulation profile No results for input(s): INR, PROTIME in the last 168 hours. ------------------------------------------------------------------------------------------------------------------- No results for input(s): DDIMER in the last 72 hours. -------------------------------------------------------------------------------------------------------------------  Cardiac Enzymes Recent Labs  Lab 06/05/17 1025 06/05/17 1615  TROPONINI 0.03* 0.03*   ------------------------------------------------------------------------------------------------------------------ Invalid input(s): POCBNP  ---------------------------------------------------------------------------------------------------------------  Urinalysis    Component Value Date/Time   COLORURINE YELLOW (A) 06/05/2017 1300   APPEARANCEUR CLEAR (A) 06/05/2017 1300   LABSPEC 1.023 06/05/2017 1300  PHURINE 5.0 06/05/2017 1300   GLUCOSEU >=500 (A) 06/05/2017 1300   HGBUR MODERATE (A) 06/05/2017 1300   BILIRUBINUR NEGATIVE 06/05/2017 1300   KETONESUR 5 (A) 06/05/2017 1300   PROTEINUR 100 (A) 06/05/2017 1300   NITRITE NEGATIVE 06/05/2017 1300   LEUKOCYTESUR NEGATIVE 06/05/2017 1300      RADIOLOGY: Dg Chest 2 View  Result Date: 06/05/2017 CLINICAL DATA:  Witnessed fall yesterday. Now with confusion. Former smoker. History of coronary artery disease and stent placement. EXAM: CHEST  2 VIEW COMPARISON:  Chest x-ray of July 28, 2010 FINDINGS: The lungs are well-expanded. There is no focal infiltrate. There is no pleural effusion or pneumothorax. The heart and pulmonary vascularity are normal. A coronary artery stent is visible. The mediastinum is normal in width. The bony thorax exhibits no acute abnormality. IMPRESSION: There is no active cardiopulmonary disease. A coronary artery stent is present. Electronically Signed   By: David  Martinique M.D.   On: 06/05/2017 14:10   Dg Lumbar Spine Complete  Result Date: 06/05/2017 CLINICAL DATA:  Unwitnessed fall yesterday.  Low back pain. EXAM: LUMBAR SPINE - COMPLETE 4+ VIEW COMPARISON:  None in PACs FINDINGS: The lumbar vertebral bodies are preserved in height. There is anterior endplate spurring at T4-1, L3-4, and L4-5. The disc space heights are well maintained. There is mild facet joint hypertrophy from L3 through S1. There is no spondylolisthesis. The pedicles and transverse processes are intact. The transverse processes of L1 are transitional. IMPRESSION: There is mild multilevel degenerative endplate spurring and facet joint hypertrophy. There is no compression fracture, spondylolisthesis, nor high-grade disc space narrowing. Electronically Signed   By: David  Martinique M.D.   On: 06/05/2017 14:13   Ct Head Wo Contrast  Result Date: 06/05/2017 CLINICAL DATA:  Unwitnessed fall yesterday.  Confusion. EXAM: CT HEAD WITHOUT CONTRAST CT CERVICAL SPINE WITHOUT CONTRAST TECHNIQUE: Multidetector CT imaging of the head and cervical spine was performed following the standard protocol without intravenous contrast. Multiplanar CT image reconstructions of the cervical spine were also generated. COMPARISON:  12/27/2012 head CT.  08/06/2012 cervical spine  CT. FINDINGS: CT HEAD FINDINGS Brain: No evidence of parenchymal hemorrhage or extra-axial fluid collection. No mass lesion, mass effect, or midline shift. No CT evidence of acute infarction. Stable encephalomalacia in the right frontal and left parietal lobes. Stable mild prominence of the CSF space in posterior aspect of the posterior fossa compatible with small recommend cyst. Nonspecific moderate subcortical and periventricular white matter hypodensity, most in keeping with chronic small vessel ischemic change. No acute ventriculomegaly. Vascular: No acute abnormality. Skull: No evidence of calvarial fracture. Sinuses/Orbits: No fluid levels. Mucoperiosteal thickening in bilateral ethmoidal air cells and left maxillary sinus. Other:  The mastoid air cells are unopacified. CT CERVICAL SPINE FINDINGS Alignment: Normal cervical lordosis. No facet subluxation. Dens is well positioned between the lateral masses of C1. Skull base and vertebrae: No acute fracture. No primary bone lesion or focal pathologic process. Soft tissues and spinal canal: No prevertebral edema. No visible canal hematoma. Disc levels: Status post ACDF C3-C6, with no evidence hardware fracture or loosening. There is bony fusion across the C3-4, C4-5 and C5-6 disc spaces. Mild degenerative disc disease in the remaining cervical disc levels. Mild bilateral facet arthropathy. Mild degenerative foraminal stenosis bilaterally at C3-4. Moderate degenerate foraminal stenosis on the left at C4-5 and on the right at C5-6. Upper chest: No acute abnormality. Other: Visualized mastoid air cells appear clear. Heterogeneous thyroid gland without discrete thyroid nodules. No pathologically enlarged cervical  nodes. IMPRESSION: CT HEAD: 1. No evidence of acute intracranial abnormality. Evidence of calvarial fracture. 2. Stable right frontal and left parietal encephalomalacia. 3. Moderate chronic small vessel ischemic changes in the cerebral white matter. 4. Mild  paranasal sinusitis, chronic appearing. CT CERVICAL SPINE: 1. No cervical spine fracture or subluxation. 2. ACDF C3-C6, with no evidence of hardware complication. 3. Mild-to-moderate multilevel degenerative changes in the cervical spine as detailed. Electronically Signed   By: Ilona Sorrel M.D.   On: 06/05/2017 13:51   Ct Cervical Spine Wo Contrast  Result Date: 06/05/2017 CLINICAL DATA:  Unwitnessed fall yesterday.  Confusion. EXAM: CT HEAD WITHOUT CONTRAST CT CERVICAL SPINE WITHOUT CONTRAST TECHNIQUE: Multidetector CT imaging of the head and cervical spine was performed following the standard protocol without intravenous contrast. Multiplanar CT image reconstructions of the cervical spine were also generated. COMPARISON:  12/27/2012 head CT.  08/06/2012 cervical spine CT. FINDINGS: CT HEAD FINDINGS Brain: No evidence of parenchymal hemorrhage or extra-axial fluid collection. No mass lesion, mass effect, or midline shift. No CT evidence of acute infarction. Stable encephalomalacia in the right frontal and left parietal lobes. Stable mild prominence of the CSF space in posterior aspect of the posterior fossa compatible with small recommend cyst. Nonspecific moderate subcortical and periventricular white matter hypodensity, most in keeping with chronic small vessel ischemic change. No acute ventriculomegaly. Vascular: No acute abnormality. Skull: No evidence of calvarial fracture. Sinuses/Orbits: No fluid levels. Mucoperiosteal thickening in bilateral ethmoidal air cells and left maxillary sinus. Other:  The mastoid air cells are unopacified. CT CERVICAL SPINE FINDINGS Alignment: Normal cervical lordosis. No facet subluxation. Dens is well positioned between the lateral masses of C1. Skull base and vertebrae: No acute fracture. No primary bone lesion or focal pathologic process. Soft tissues and spinal canal: No prevertebral edema. No visible canal hematoma. Disc levels: Status post ACDF C3-C6, with no evidence  hardware fracture or loosening. There is bony fusion across the C3-4, C4-5 and C5-6 disc spaces. Mild degenerative disc disease in the remaining cervical disc levels. Mild bilateral facet arthropathy. Mild degenerative foraminal stenosis bilaterally at C3-4. Moderate degenerate foraminal stenosis on the left at C4-5 and on the right at C5-6. Upper chest: No acute abnormality. Other: Visualized mastoid air cells appear clear. Heterogeneous thyroid gland without discrete thyroid nodules. No pathologically enlarged cervical nodes. IMPRESSION: CT HEAD: 1. No evidence of acute intracranial abnormality. Evidence of calvarial fracture. 2. Stable right frontal and left parietal encephalomalacia. 3. Moderate chronic small vessel ischemic changes in the cerebral white matter. 4. Mild paranasal sinusitis, chronic appearing. CT CERVICAL SPINE: 1. No cervical spine fracture or subluxation. 2. ACDF C3-C6, with no evidence of hardware complication. 3. Mild-to-moderate multilevel degenerative changes in the cervical spine as detailed. Electronically Signed   By: Ilona Sorrel M.D.   On: 06/05/2017 13:51    EKG: Orders placed or performed during the hospital encounter of 06/05/17  . ED EKG  . ED EKG  . EKG 12-Lead  . EKG 12-Lead    IMPRESSION AND PLAN: 77 year old male patient with history of hypertension, hyperlipidemia, coronary artery disease, paroxysmal atrial tachycardia presented to the emergency room with weakness, cough congestion and fall.  Admitting diagnosis 1.  Flu syndrome 2.  Accidental fall 3.  Dehydration 4.Hyponatremia Treatment plan Admit patient to medical floor IV fluid hydration Physical therapy evaluation Oral Tamiflu Resume home medications  All the records are reviewed and case discussed with ED provider. Management plans discussed with the patient, family and they are in  agreement.  CODE STATUS:FULL CODE Code Status History    This patient does not have a recorded code status.  Please follow your organizational policy for patients in this situation.       TOTAL TIME TAKING CARE OF THIS PATIENT: 50 minutes.    Saundra Shelling M.D on 06/05/2017 at 6:40 PM  Between 7am to 6pm - Pager - [phone]  After 6pm go to [email or web address] - password [email or web address] [facility]  [facility]  Office  [phone]  CC: Primary care physician; Leonel Ramsay, MD

## 2017-06-05 NOTE — ED Notes (Signed)
Date and time results received: 06/05/17 1345  Test: troponin Critical Value: 0.03  Name of Provider Notified: Dr. Cinda Quest

## 2017-06-05 NOTE — ED Notes (Signed)
Pt provided snack and water 

## 2017-06-05 NOTE — ED Provider Notes (Addendum)
[facility] Emergency Department Provider Note   ____________________________________________   First MD Initiated Contact with Patient 06/05/17 1256     (approximate)  I have reviewed the triage vital signs and the nursing notes.   HISTORY  Chief Complaint Fall    HPI Raymond Sampson is a 77 y.o. male Who comes in with his wife. He's been getting weak progressively but slowly. He was post get physical therapy but he doesn't want to get up out of his recliner and times as an gone. Yesterday patient fell and then today he can't walk she is too weak to do it is too weak to pull himself up off the floor 2. Status weak diffusely. He has low back pain since the fall. He is running a low-grade fever 99.8 here in the ER. He did not know that.   Past Medical History:  Diagnosis Date  . Arthritic-like pain   . CAD (coronary artery disease)    a. apical MI 5/07 with LHC showing 50% pCFX, 30% pRCA, 99% pLAD, 99% mLAD, 75% dLAD (cypher DES x3 to LAD); b. ETT-myoview (12/08) 81% MPHR,  EF 64%, partially reversible inferoapical perfusion defect similar to prior study 12/07; c. 04/2016 MV: EF 59%, fixed anteroapical defect, no ischemia->Low risk.  . Carotid arterial disease ([place])    a. 05/2016 Carotid U/S: 30-40% bilat dzs, f/u in 2 yrs.  . CVA (cerebral vascular accident) ([place])    a. 07/2010 - h/o CVA/TIA.  . Diet-controlled type 2 diabetes mellitus ([place])   . HLD (hyperlipidemia)   . HTN (hypertension)   . PAT (paroxysmal atrial tachycardia) ([place])    a. 06/2016 Event monitor:  rare episodes of atrial tachycardia, longest 7 beats. No afib.    Patient Active Problem List   Diagnosis Date Noted  . Diabetes mellitus type 2, controlled, with complications ([place]) 01/77/9390  . Carotid stenosis 05/14/2013  . CAROTID BRUIT, LEFT 04/25/2010  . PALPITATIONS 06/18/2009  . Hyperlipidemia 04/27/2009  . Essential hypertension 04/27/2009  . CAD, NATIVE VESSEL 04/27/2009     Past Surgical History:  Procedure Laterality Date  . BACK SURGERY    . CARDIAC CATHETERIZATION    . CORONARY ANGIOPLASTY WITH STENT PLACEMENT     apical MI 5/07 with LHC showing 50% pCFX, 30% pRCA, 99% pLAD, 9%% mLAD, 75% dLAD. cypher DES x3 to LAD. ETT-myoview (12/08) 81% MPHR, 8'1, EF 64%, partially reversible inferoapical perfusion defect similar to prior study 12/07    Prior to Admission medications   Medication Sig Start Date End Date Taking? Authorizing Provider  amLODipine (NORVASC) 10 MG tablet Take 1 tablet (10 mg total) by mouth daily. 11/01/16 01/30/17  Theora Gianotti, NP  aspirin 81 MG tablet Take 81 mg by mouth daily.     [provider]  clopidogrel (PLAVIX) 75 MG tablet Take 1 tablet (75 mg total) by mouth daily with breakfast. 02/24/13   Minna Merritts, MD  gabapentin (NEURONTIN) 100 MG capsule Take 100 mg by mouth at bedtime.    [provider]  lisinopril (PRINIVIL,ZESTRIL) 20 MG tablet TAKE 1 TABLET (20 MG TOTAL) BY MOUTH DAILY. 10/23/16   Minna Merritts, MD  metFORMIN (GLUCOPHAGE) 1000 MG tablet Take 1,000 mg by mouth 2 (two) times daily with a meal.    [provider]  metoprolol tartrate (LOPRESSOR) 25 MG tablet TAKE 1 TABLET BY MOUTH TWICE A DAY 05/15/17   Minna Merritts, MD  sertraline (ZOLOFT) 25 MG tablet Take  1 tablet (25 mg total) by mouth daily. 04/26/16   Minna Merritts, MD  simvastatin (ZOCOR) 20 MG tablet TAKE 1 TABLET (20 MG TOTAL) BY MOUTH DAILY. 11/07/16   Minna Merritts, MD    Allergies Patient has no known allergies.  Family History  Family history unknown: Yes    Social History Social History   Tobacco Use  . Smoking status: Former Smoker    Packs/day: 1.00    Years: 25.00    Pack years: 25.00    Types: Cigarettes    Last attempt to quit: 04/17/1985    Years since quitting: 32.1  . Smokeless tobacco: Never Used  Substance Use Topics  . Alcohol use: No  . Drug use: No    Review of  Systems  Constitutional: No fever/chills Eyes: No visual changes. ENT: No sore throat. Cardiovascular: Denies chest pain. Respiratory: Denies shortness of breath. Gastrointestinal: No abdominal pain.  No nausea, no vomiting.  No diarrhea.  No constipation. Genitourinary: Negative for dysuria. Musculoskeletal:back pain. Skin: Negative for rash. Neurological: Negative for headaches ____________________________________________   PHYSICAL EXAM:  VITAL SIGNS: ED Triage Vitals  Enc Vitals Group     BP 06/05/17 1030 (!) 148/82     Pulse Rate 06/05/17 1030 (!) 104     Resp 06/05/17 1030 18     Temp 06/05/17 1030 99.8 F (37.7 C)     Temp Source 06/05/17 1030 Oral     SpO2 06/05/17 1030 94 %     Weight 06/05/17 1024 165 lb (74.8 kg)     Height 06/05/17 1024 5\' 7"  (1.702 m)     Head Circumference --      Peak Flow --      Pain Score --      Pain Loc --      Pain Edu? --      Excl. in [place]? --    Constitutional: Alert and oriented. Well appearing and in no acute distress. Eyes: Conjunctivae are normal.  Head: Atraumatic. Nose: No congestion/rhinnorhea. Mouth/Throat: Mucous membranes are moist.  Oropharynx non-erythematous. Neck: No stridor.  Cardiovascular: Normal rate, regular rhythm. Grossly normal heart sounds.  Good peripheral circulation. Respiratory: Normal respiratory effort.  No retractions. Lungs CTAB. Gastrointestinal: Soft and nontender. No distention. No abdominal bruits. No CVA tenderness. Musculoskeletal: No lower extremity tenderness nor edema.  No joint effusions.patient's back is tender to L5. Neurologic:  Normal speech and language. No gross focal neurologic deficits are appreciated. she is however diffusely weak. Arms and legs him over to overcome him almost anywhere.  Skin:  Skin is warm, dry and intact. No rash noted. Psychiatric: Mood and affect are normal. Speech and behavior are normal.  ____________________________________________   LABS (all labs  ordered are listed, but only abnormal results are displayed)  Labs Reviewed  COMPREHENSIVE METABOLIC PANEL - Abnormal; Notable for the following components:      Result Value   Sodium 134 (*)    Chloride 98 (*)    Glucose, Bld 185 (*)    BUN 23 (*)    Total Protein 8.4 (*)    AST 52 (*)    GFR calc non Af Amer 59 (*)    All other components within normal limits  INFLUENZA PANEL BY PCR (TYPE A & B) - Abnormal; Notable for the following components:   Influenza A By PCR POSITIVE (*)    All other components within normal limits  URINALYSIS, COMPLETE (UACMP) WITH MICROSCOPIC - Abnormal; Notable for the  following components:   Color, Urine YELLOW (*)    APPearance CLEAR (*)    Glucose, UA >=500 (*)    Hgb urine dipstick MODERATE (*)    Ketones, ur 5 (*)    Protein, ur 100 (*)    Squamous Epithelial / LPF 0-5 (*)    All other components within normal limits  TROPONIN I - Abnormal; Notable for the following components:   Troponin I 0.03 (*)    All other components within normal limits  TROPONIN I - Abnormal; Notable for the following components:   Troponin I 0.03 (*)    All other components within normal limits  CBC  CBG MONITORING, ED   ____________________________________________  EKG EKG read and interpreted by me shows normal sinus rhythm rate of 82 right axis computer is reading minimal ST depression in the lateral leads this may be true difficult to see otherwise no acute changes  ____________________________________________  RADIOLOGY  ED MD interpretation:  just x-ray read by radiology reviewed by myself shows no acute disease. Lumbar spine also DJD only T the head and neck show no acute disease either per radiology I reviewed the films  Official radiology report(s): Dg Chest 2 View  Result Date: 06/05/2017 CLINICAL DATA:  Witnessed fall yesterday. Now with confusion. Former smoker. History of coronary artery disease and stent placement. EXAM: CHEST  2 VIEW COMPARISON:   Chest x-ray of July 28, 2010 FINDINGS: The lungs are well-expanded. There is no focal infiltrate. There is no pleural effusion or pneumothorax. The heart and pulmonary vascularity are normal. A coronary artery stent is visible. The mediastinum is normal in width. The bony thorax exhibits no acute abnormality. IMPRESSION: There is no active cardiopulmonary disease. A coronary artery stent is present. Electronically Signed   By: David  Martinique M.D.   On: 06/05/2017 14:10   Dg Lumbar Spine Complete  Result Date: 06/05/2017 CLINICAL DATA:  Unwitnessed fall yesterday.  Low back pain. EXAM: LUMBAR SPINE - COMPLETE 4+ VIEW COMPARISON:  None in PACs FINDINGS: The lumbar vertebral bodies are preserved in height. There is anterior endplate spurring at K7-4, L3-4, and L4-5. The disc space heights are well maintained. There is mild facet joint hypertrophy from L3 through S1. There is no spondylolisthesis. The pedicles and transverse processes are intact. The transverse processes of L1 are transitional. IMPRESSION: There is mild multilevel degenerative endplate spurring and facet joint hypertrophy. There is no compression fracture, spondylolisthesis, nor high-grade disc space narrowing. Electronically Signed   By: David  Martinique M.D.   On: 06/05/2017 14:13   Ct Head Wo Contrast  Result Date: 06/05/2017 CLINICAL DATA:  Unwitnessed fall yesterday.  Confusion. EXAM: CT HEAD WITHOUT CONTRAST CT CERVICAL SPINE WITHOUT CONTRAST TECHNIQUE: Multidetector CT imaging of the head and cervical spine was performed following the standard protocol without intravenous contrast. Multiplanar CT image reconstructions of the cervical spine were also generated. COMPARISON:  12/27/2012 head CT.  08/06/2012 cervical spine CT. FINDINGS: CT HEAD FINDINGS Brain: No evidence of parenchymal hemorrhage or extra-axial fluid collection. No mass lesion, mass effect, or midline shift. No CT evidence of acute infarction. Stable encephalomalacia in the right  frontal and left parietal lobes. Stable mild prominence of the CSF space in posterior aspect of the posterior fossa compatible with small recommend cyst. Nonspecific moderate subcortical and periventricular white matter hypodensity, most in keeping with chronic small vessel ischemic change. No acute ventriculomegaly. Vascular: No acute abnormality. Skull: No evidence of calvarial fracture. Sinuses/Orbits: No fluid levels. Mucoperiosteal thickening  in bilateral ethmoidal air cells and left maxillary sinus. Other:  The mastoid air cells are unopacified. CT CERVICAL SPINE FINDINGS Alignment: Normal cervical lordosis. No facet subluxation. Dens is well positioned between the lateral masses of C1. Skull base and vertebrae: No acute fracture. No primary bone lesion or focal pathologic process. Soft tissues and spinal canal: No prevertebral edema. No visible canal hematoma. Disc levels: Status post ACDF C3-C6, with no evidence hardware fracture or loosening. There is bony fusion across the C3-4, C4-5 and C5-6 disc spaces. Mild degenerative disc disease in the remaining cervical disc levels. Mild bilateral facet arthropathy. Mild degenerative foraminal stenosis bilaterally at C3-4. Moderate degenerate foraminal stenosis on the left at C4-5 and on the right at C5-6. Upper chest: No acute abnormality. Other: Visualized mastoid air cells appear clear. Heterogeneous thyroid gland without discrete thyroid nodules. No pathologically enlarged cervical nodes. IMPRESSION: CT HEAD: 1. No evidence of acute intracranial abnormality. Evidence of calvarial fracture. 2. Stable right frontal and left parietal encephalomalacia. 3. Moderate chronic small vessel ischemic changes in the cerebral white matter. 4. Mild paranasal sinusitis, chronic appearing. CT CERVICAL SPINE: 1. No cervical spine fracture or subluxation. 2. ACDF C3-C6, with no evidence of hardware complication. 3. Mild-to-moderate multilevel degenerative changes in the cervical  spine as detailed. Electronically Signed   By: Ilona Sorrel M.D.   On: 06/05/2017 13:51   Ct Cervical Spine Wo Contrast  Result Date: 06/05/2017 CLINICAL DATA:  Unwitnessed fall yesterday.  Confusion. EXAM: CT HEAD WITHOUT CONTRAST CT CERVICAL SPINE WITHOUT CONTRAST TECHNIQUE: Multidetector CT imaging of the head and cervical spine was performed following the standard protocol without intravenous contrast. Multiplanar CT image reconstructions of the cervical spine were also generated. COMPARISON:  12/27/2012 head CT.  08/06/2012 cervical spine CT. FINDINGS: CT HEAD FINDINGS Brain: No evidence of parenchymal hemorrhage or extra-axial fluid collection. No mass lesion, mass effect, or midline shift. No CT evidence of acute infarction. Stable encephalomalacia in the right frontal and left parietal lobes. Stable mild prominence of the CSF space in posterior aspect of the posterior fossa compatible with small recommend cyst. Nonspecific moderate subcortical and periventricular white matter hypodensity, most in keeping with chronic small vessel ischemic change. No acute ventriculomegaly. Vascular: No acute abnormality. Skull: No evidence of calvarial fracture. Sinuses/Orbits: No fluid levels. Mucoperiosteal thickening in bilateral ethmoidal air cells and left maxillary sinus. Other:  The mastoid air cells are unopacified. CT CERVICAL SPINE FINDINGS Alignment: Normal cervical lordosis. No facet subluxation. Dens is well positioned between the lateral masses of C1. Skull base and vertebrae: No acute fracture. No primary bone lesion or focal pathologic process. Soft tissues and spinal canal: No prevertebral edema. No visible canal hematoma. Disc levels: Status post ACDF C3-C6, with no evidence hardware fracture or loosening. There is bony fusion across the C3-4, C4-5 and C5-6 disc spaces. Mild degenerative disc disease in the remaining cervical disc levels. Mild bilateral facet arthropathy. Mild degenerative foraminal  stenosis bilaterally at C3-4. Moderate degenerate foraminal stenosis on the left at C4-5 and on the right at C5-6. Upper chest: No acute abnormality. Other: Visualized mastoid air cells appear clear. Heterogeneous thyroid gland without discrete thyroid nodules. No pathologically enlarged cervical nodes. IMPRESSION: CT HEAD: 1. No evidence of acute intracranial abnormality. Evidence of calvarial fracture. 2. Stable right frontal and left parietal encephalomalacia. 3. Moderate chronic small vessel ischemic changes in the cerebral white matter. 4. Mild paranasal sinusitis, chronic appearing. CT CERVICAL SPINE: 1. No cervical spine fracture or subluxation. 2. ACDF  C3-C6, with no evidence of hardware complication. 3. Mild-to-moderate multilevel degenerative changes in the cervical spine as detailed. Electronically Signed   By: Ilona Sorrel M.D.   On: 06/05/2017 13:51    ____________________________________________   PROCEDURES  Procedure(s) performed:   Procedures  Critical Care performed:   ____________________________________________   INITIAL IMPRESSION / ASSESSMENT AND PLAN / ED COURSE   we attempted to have patient walk he cannot stand without 2 people helping him and he is unable to make any for progress when he walks. He cannot live at home at his present condition.        ____________________________________________   FINAL CLINICAL IMPRESSION(S) / ED DIAGNOSES  Final diagnoses:  Fall in home, initial encounter  Weak  Unable to walk  Influenza     ED Discharge Orders    None       Note:  This document was prepared using Dragon voice recognition software and may include unintentional dictation errors.    Nena Polio, MD 06/05/17 1714    Nena Polio, MD 06/05/17 [phone]

## 2017-06-05 NOTE — ED Notes (Signed)
Pt arrives to ED with wife who states he fell around 35 yesterday. Wife came home around 4 and pt was lying on floor. Takes plavix. Bruising noted to both arms. No active bleeding. Denies hitting head except for small abrasion to forehead. Alert and oriented. Pt states weakness and unable to walk. Moving all extremities on own. Wife states she has been trying to get pt to do physical therapy but pt doesn't want to participate.   States cough. Denies N&V&D or fever.

## 2017-06-05 NOTE — ED Notes (Signed)
Pt taken to scans via stretcher.  

## 2017-06-05 NOTE — ED Notes (Signed)
Admitting at bedside 

## 2017-06-06 ENCOUNTER — Other Ambulatory Visit: Payer: Self-pay

## 2017-06-06 LAB — BASIC METABOLIC PANEL
ANION GAP: 11 (ref 5–15)
BUN: 26 mg/dL — ABNORMAL HIGH (ref 6–20)
CALCIUM: 8.4 mg/dL — AB (ref 8.9–10.3)
CO2: 22 mmol/L (ref 22–32)
Chloride: 102 mmol/L (ref 101–111)
Creatinine, Ser: 1.13 mg/dL (ref 0.61–1.24)
GFR calc non Af Amer: >60 mL/min (ref >60)
Glucose, Bld: 158 mg/dL — ABNORMAL HIGH (ref 65–99)
Potassium: 3.7 mmol/L (ref 3.5–5.1)
Sodium: 135 mmol/L (ref 135–145)

## 2017-06-06 LAB — CBC
HCT: 38.4 % — ABNORMAL LOW (ref 40.0–52.0)
HEMOGLOBIN: 12.7 g/dL — AB (ref 13.0–18.0)
MCH: 30 pg (ref 26.0–34.0)
MCHC: 33 g/dL (ref 32.0–36.0)
MCV: 90.9 fL (ref 80.0–100.0)
Platelets: 225 10*3/uL (ref 150–440)
RBC: 4.22 MIL/uL — AB (ref 4.40–5.90)
RDW: 13.6 % (ref 11.5–14.5)
WBC: 7.7 10*3/uL (ref 3.8–10.6)

## 2017-06-06 MED ORDER — IPRATROPIUM-ALBUTEROL 0.5-2.5 (3) MG/3ML IN SOLN
3.0000 mL | Freq: Four times a day (QID) | RESPIRATORY_TRACT | Status: DC
  Administered 2017-06-06 – 2017-06-07 (×3): 3 mL via RESPIRATORY_TRACT
  Filled 2017-06-06 (×4): qty 3

## 2017-06-06 NOTE — Progress Notes (Signed)
[facility] [place] NAME: Raymond Sampson    MR#:  [id number]  DATE OF BIRTH:  1940/12/17  SUBJECTIVE:  CHIEF COMPLAINT:   Chief Complaint  Patient presents with  . Fall  Patient without complaint-feeling better, wife at the bedside  REVIEW OF SYSTEMS:  CONSTITUTIONAL: No fever, fatigue or weakness.  EYES: No blurred or double vision.  EARS, NOSE, AND THROAT: No tinnitus or ear pain.  RESPIRATORY: No cough, shortness of breath, wheezing or hemoptysis.  CARDIOVASCULAR: No chest pain, orthopnea, edema.  GASTROINTESTINAL: No nausea, vomiting, diarrhea or abdominal pain.  GENITOURINARY: No dysuria, hematuria.  ENDOCRINE: No polyuria, nocturia,  HEMATOLOGY: No anemia, easy bruising or bleeding SKIN: No rash or lesion. MUSCULOSKELETAL: No joint pain or arthritis.   NEUROLOGIC: No tingling, numbness, weakness.  PSYCHIATRY: No anxiety or depression.   ROS  DRUG ALLERGIES:  No Known Allergies  VITALS:  Blood pressure (!) 130/59, pulse 88, temperature (!) 97.4 F (36.3 C), temperature source Oral, resp. rate 19, height 5\' 7"  (1.702 m), weight 68.2 kg (150 lb 6.4 oz), SpO2 (!) 30 %.  PHYSICAL EXAMINATION:  GENERAL:  77 y.o.-year-old patient lying in the bed with no acute distress.  EYES: Pupils equal, round, reactive to light and accommodation. No scleral icterus. Extraocular muscles intact.  HEENT: Head atraumatic, normocephalic. Oropharynx and nasopharynx clear.  NECK:  Supple, no jugular venous distention. No thyroid enlargement, no tenderness.  LUNGS: Normal breath sounds bilaterally, no wheezing, rales,rhonchi or crepitation. No use of accessory muscles of respiration.  CARDIOVASCULAR: S1, S2 normal. No murmurs, rubs, or gallops.  ABDOMEN: Soft, nontender, nondistended. Bowel sounds present. No organomegaly or mass.  EXTREMITIES: No pedal edema, cyanosis, or clubbing.  NEUROLOGIC: Cranial nerves II through XII are intact. Muscle  strength 5/5 in all extremities. Sensation intact. Gait not checked.  PSYCHIATRIC: The patient is alert and oriented x 3.  SKIN: No obvious rash, lesion, or ulcer.   Physical Exam LABORATORY PANEL:   CBC Recent Labs  Lab 06/06/17 0314  WBC 7.7  HGB 12.7*  HCT 38.4*  PLT 225   ------------------------------------------------------------------------------------------------------------------  Chemistries  Recent Labs  Lab 06/05/17 1025 06/06/17 0314  NA 134* 135  K 4.1 3.7  CL 98* 102  CO2 25 22  GLUCOSE 185* 158*  BUN 23* 26*  CREATININE 1.16 1.13  CALCIUM 9.4 8.4*  AST 52*  --   ALT 20  --   ALKPHOS 58  --   BILITOT 1.1  --    ------------------------------------------------------------------------------------------------------------------  Cardiac Enzymes Recent Labs  Lab 06/05/17 1025 06/05/17 1615  TROPONINI 0.03* 0.03*   ------------------------------------------------------------------------------------------------------------------  RADIOLOGY:  Dg Chest 2 View  Result Date: 06/05/2017 CLINICAL DATA:  Witnessed fall yesterday. Now with confusion. Former smoker. History of coronary artery disease and stent placement. EXAM: CHEST  2 VIEW COMPARISON:  Chest x-ray of July 28, 2010 FINDINGS: The lungs are well-expanded. There is no focal infiltrate. There is no pleural effusion or pneumothorax. The heart and pulmonary vascularity are normal. A coronary artery stent is visible. The mediastinum is normal in width. The bony thorax exhibits no acute abnormality. IMPRESSION: There is no active cardiopulmonary disease. A coronary artery stent is present. Electronically Signed   By: David  Martinique M.D.   On: 06/05/2017 14:10   Dg Lumbar Spine Complete  Result Date: 06/05/2017 CLINICAL DATA:  Unwitnessed fall yesterday.  Low back pain. EXAM: LUMBAR SPINE - COMPLETE 4+ VIEW COMPARISON:  None in PACs FINDINGS:  The lumbar vertebral bodies are preserved in height. There is  anterior endplate spurring at H4-7, L3-4, and L4-5. The disc space heights are well maintained. There is mild facet joint hypertrophy from L3 through S1. There is no spondylolisthesis. The pedicles and transverse processes are intact. The transverse processes of L1 are transitional. IMPRESSION: There is mild multilevel degenerative endplate spurring and facet joint hypertrophy. There is no compression fracture, spondylolisthesis, nor high-grade disc space narrowing. Electronically Signed   By: David  Martinique M.D.   On: 06/05/2017 14:13   Ct Head Wo Contrast  Result Date: 06/05/2017 CLINICAL DATA:  Unwitnessed fall yesterday.  Confusion. EXAM: CT HEAD WITHOUT CONTRAST CT CERVICAL SPINE WITHOUT CONTRAST TECHNIQUE: Multidetector CT imaging of the head and cervical spine was performed following the standard protocol without intravenous contrast. Multiplanar CT image reconstructions of the cervical spine were also generated. COMPARISON:  12/27/2012 head CT.  08/06/2012 cervical spine CT. FINDINGS: CT HEAD FINDINGS Brain: No evidence of parenchymal hemorrhage or extra-axial fluid collection. No mass lesion, mass effect, or midline shift. No CT evidence of acute infarction. Stable encephalomalacia in the right frontal and left parietal lobes. Stable mild prominence of the CSF space in posterior aspect of the posterior fossa compatible with small recommend cyst. Nonspecific moderate subcortical and periventricular white matter hypodensity, most in keeping with chronic small vessel ischemic change. No acute ventriculomegaly. Vascular: No acute abnormality. Skull: No evidence of calvarial fracture. Sinuses/Orbits: No fluid levels. Mucoperiosteal thickening in bilateral ethmoidal air cells and left maxillary sinus. Other:  The mastoid air cells are unopacified. CT CERVICAL SPINE FINDINGS Alignment: Normal cervical lordosis. No facet subluxation. Dens is well positioned between the lateral masses of C1. Skull base and  vertebrae: No acute fracture. No primary bone lesion or focal pathologic process. Soft tissues and spinal canal: No prevertebral edema. No visible canal hematoma. Disc levels: Status post ACDF C3-C6, with no evidence hardware fracture or loosening. There is bony fusion across the C3-4, C4-5 and C5-6 disc spaces. Mild degenerative disc disease in the remaining cervical disc levels. Mild bilateral facet arthropathy. Mild degenerative foraminal stenosis bilaterally at C3-4. Moderate degenerate foraminal stenosis on the left at C4-5 and on the right at C5-6. Upper chest: No acute abnormality. Other: Visualized mastoid air cells appear clear. Heterogeneous thyroid gland without discrete thyroid nodules. No pathologically enlarged cervical nodes. IMPRESSION: CT HEAD: 1. No evidence of acute intracranial abnormality. Evidence of calvarial fracture. 2. Stable right frontal and left parietal encephalomalacia. 3. Moderate chronic small vessel ischemic changes in the cerebral white matter. 4. Mild paranasal sinusitis, chronic appearing. CT CERVICAL SPINE: 1. No cervical spine fracture or subluxation. 2. ACDF C3-C6, with no evidence of hardware complication. 3. Mild-to-moderate multilevel degenerative changes in the cervical spine as detailed. Electronically Signed   By: Ilona Sorrel M.D.   On: 06/05/2017 13:51   Ct Cervical Spine Wo Contrast  Result Date: 06/05/2017 CLINICAL DATA:  Unwitnessed fall yesterday.  Confusion. EXAM: CT HEAD WITHOUT CONTRAST CT CERVICAL SPINE WITHOUT CONTRAST TECHNIQUE: Multidetector CT imaging of the head and cervical spine was performed following the standard protocol without intravenous contrast. Multiplanar CT image reconstructions of the cervical spine were also generated. COMPARISON:  12/27/2012 head CT.  08/06/2012 cervical spine CT. FINDINGS: CT HEAD FINDINGS Brain: No evidence of parenchymal hemorrhage or extra-axial fluid collection. No mass lesion, mass effect, or midline shift. No CT  evidence of acute infarction. Stable encephalomalacia in the right frontal and left parietal lobes. Stable mild prominence of the  CSF space in posterior aspect of the posterior fossa compatible with small recommend cyst. Nonspecific moderate subcortical and periventricular white matter hypodensity, most in keeping with chronic small vessel ischemic change. No acute ventriculomegaly. Vascular: No acute abnormality. Skull: No evidence of calvarial fracture. Sinuses/Orbits: No fluid levels. Mucoperiosteal thickening in bilateral ethmoidal air cells and left maxillary sinus. Other:  The mastoid air cells are unopacified. CT CERVICAL SPINE FINDINGS Alignment: Normal cervical lordosis. No facet subluxation. Dens is well positioned between the lateral masses of C1. Skull base and vertebrae: No acute fracture. No primary bone lesion or focal pathologic process. Soft tissues and spinal canal: No prevertebral edema. No visible canal hematoma. Disc levels: Status post ACDF C3-C6, with no evidence hardware fracture or loosening. There is bony fusion across the C3-4, C4-5 and C5-6 disc spaces. Mild degenerative disc disease in the remaining cervical disc levels. Mild bilateral facet arthropathy. Mild degenerative foraminal stenosis bilaterally at C3-4. Moderate degenerate foraminal stenosis on the left at C4-5 and on the right at C5-6. Upper chest: No acute abnormality. Other: Visualized mastoid air cells appear clear. Heterogeneous thyroid gland without discrete thyroid nodules. No pathologically enlarged cervical nodes. IMPRESSION: CT HEAD: 1. No evidence of acute intracranial abnormality. Evidence of calvarial fracture. 2. Stable right frontal and left parietal encephalomalacia. 3. Moderate chronic small vessel ischemic changes in the cerebral white matter. 4. Mild paranasal sinusitis, chronic appearing. CT CERVICAL SPINE: 1. No cervical spine fracture or subluxation. 2. ACDF C3-C6, with no evidence of hardware complication.  3. Mild-to-moderate multilevel degenerative changes in the cervical spine as detailed. Electronically Signed   By: Ilona Sorrel M.D.   On: 06/05/2017 13:51    ASSESSMENT AND PLAN:  77 year old male patient with history of hypertension, hyperlipidemia, coronary artery disease, paroxysmal atrial tachycardia presented to the emergency room with weakness, cough congestion and fall.  1 acute influenza  Resolving Continue Tamiflu with supportive care, IV fluids for rehydration  2 acute mechanical fall Continue increase nursing care as needed/fall precautions, physical therapy to evaluate/treat  3 acute dehydration Secondary to above Resolved with IV fluids for rehydration  4 acute hyponatremia repleted with IV fluids for rehydration  all the records are reviewed and case discussed with Care Management/Social Workerr. Management plans discussed with the patient, family and they are in agreement.  CODE STATUS: full  TOTAL TIME TAKING CARE OF THIS PATIENT: 35 minutes.     POSSIBLE D/C IN 1-2 DAYS, DEPENDING ON CLINICAL CONDITION.   Avel Peace Joana Nolton M.D on 06/06/2017   Between 7am to 6pm - Pager - [phone]  After 6pm go to [email or web address] - password [email or web address] [facility]  Office  [phone]  CC: Primary care physician; Leonel Ramsay, MD  Note: This dictation was prepared with Dragon dictation along with smaller phrase technology. Any transcriptional errors that result from this process are unintentional.

## 2017-06-06 NOTE — Progress Notes (Signed)
New Admission Note:   Arrival Method: per stretcher from ED, pt came from home Mental Orientation: alert and oriented X4 but pt can be forgetful Telemetry: placed on telebox [id number], CCMD notified, verified with [place], [place] Assessment: Completed Skin: warm, dry, with ecchymosis noted on both arms, with redness noted on the sacrum, prophylactic sacral foam applied. IV: G20 on the left forearm with transparent dressing, intact Pain: denies any pain as of this time Safety Measures: Safety Fall Prevention Plan has been given and discussed Admission: Completed 1A  Orientation: Patient has been oriented to the room, unit and staff.  Family: no family member at bedside this time  Orders have been reviewed and implemented.  Pt on low bed, call light has been placed within reach, yellow socks on, fall risk bracelet applied and bed alarm activated.   Georgeanna Harrison, RN

## 2017-06-06 NOTE — Evaluation (Signed)
Physical Therapy Evaluation Patient Details Name: Raymond Sampson MRN: [id number] DOB: [birth year] Today's Date: 06/06/2017   History of Present Illness  Pt admitted for flu. Pt with complaints of fall at home due to B legs giving way. History includes CAD, HTN, CVA along with ACDF C3-C6 12 years ago.  Clinical Impression  Pt is a pleasant 77 year old male who was admitted for flu. Pt performs bed mobility with min assist, transfers with cga, and ambulation with cga and RW. +2 used for equipment. Pt is alert and oriented. Pt demonstrates deficits with strength/endurance/balance. Trialed ambulation with/without AD. Balance impaired and gait unsteady, recommend use of RW at this time to prevent further falls. Needs supervision at home secondary to balance deficits. Reports he is mostly sedentary and stays in bed or recliner most of the day. Encouraged an active lifestyle with ambulation. Appears to be close to baseline and eager to dc home. All mobility performed on O2 with sats WNL. Would benefit from skilled PT to address above deficits and promote optimal return to PLOF. Recommend transition to [facility] upon discharge from acute hospitalization.       Follow Up Recommendations Home health PT;Supervision for mobility/OOB    Equipment Recommendations  None recommended by PT    Recommendations for Other Services       Precautions / Restrictions Precautions Precautions: Fall Restrictions Weight Bearing Restrictions: No      Mobility  Bed Mobility Overal bed mobility: Needs Assistance Bed Mobility: Supine to Sit     Supine to sit: Min assist     General bed mobility comments: safe technique with assist for trunk and guidance of B LEs off bed. once seated, able to sit with upright posture  Transfers Overall transfer level: Needs assistance Equipment used: Rolling walker (2 wheeled) Transfers: Sit to/from Stand Sit to Stand: Min guard;+2 safety/equipment         General transfer  comment: safe technique and able to perform static stance without AD.  All mobility performed on 2L of O2   Ambulation/Gait Ambulation/Gait assistance: Min guard Ambulation Distance (Feet): 40 Feet Assistive device: Rolling walker (2 wheeled) Gait Pattern/deviations: Step-through pattern     General Gait Details: ambulated around room secondary to isolation precautions and pt not wanting to don mask. Ambulated with safe technique and no SOB symptoms. O2 sats at 95% Safe technique with RW, no LOB noted  Stairs            Wheelchair Mobility    Modified Rankin (Stroke Patients Only)       Balance Overall balance assessment: Needs assistance;History of Falls Sitting-balance support: Feet supported Sitting balance-Leahy Scale: Good     Standing balance support: Bilateral upper extremity supported Standing balance-Leahy Scale: Good                               Pertinent Vitals/Pain Pain Assessment: No/denies pain    Home Living Family/patient expects to be discharged to:: Private residence Living Arrangements: Spouse/significant other Available Help at Discharge: Family;Available PRN/intermittently Type of Home: House Home Access: Stairs to enter Entrance Stairs-Rails: None Entrance Stairs-Number of Steps: 2 Home Layout: One level Home Equipment: Walker - 2 wheels      Prior Function Level of Independence: Independent         Comments: previously independent, however recently has been weak and started using furniture. Reports history of 2 falls recently     Hand Dominance  Extremity/Trunk Assessment   Upper Extremity Assessment Upper Extremity Assessment: Overall WFL for tasks assessed    Lower Extremity Assessment Lower Extremity Assessment: Generalized weakness(B LE grossly 4/5)       Communication   Communication: No difficulties  Cognition Arousal/Alertness: Awake/alert Behavior During Therapy: WFL for tasks  assessed/performed Overall Cognitive Status: Within Functional Limits for tasks assessed                                        General Comments      Exercises Other Exercises Other Exercises: SUpine ther-ex performed x 10 reps on BLE including ankle pumps, SLRs, and quad sets. All ther-ex performed x 10 reps with cga and safe technique. Other Exercises: FUrther ambulation performed in room without AD. Pt unsteady and needs cues for balance. Occasionally reaches out for furniture and hands in high guard occasionally. O2 sats WNL while on 2L of O2. Additional 38' performed   Assessment/Plan    PT Assessment Patient needs continued PT services  PT Problem List Decreased strength;Decreased balance;Decreased mobility       PT Treatment Interventions Gait training;Therapeutic exercise;Balance training    PT Goals (Current goals can be found in the Care Plan section)  Acute Rehab PT Goals Patient Stated Goal: to go home PT Goal Formulation: With patient Time For Goal Achievement: 06/20/17 Potential to Achieve Goals: Good    Frequency Min 2X/week   Barriers to discharge        Co-evaluation               AM-PAC PT "6 Clicks" Daily Activity  Outcome Measure Difficulty turning over in bed (including adjusting bedclothes, sheets and blankets)?: Unable Difficulty moving from lying on back to sitting on the side of the bed? : Unable Difficulty sitting down on and standing up from a chair with arms (e.g., wheelchair, bedside commode, etc,.)?: Unable Help needed moving to and from a bed to chair (including a wheelchair)?: None Help needed walking in hospital room?: None Help needed climbing 3-5 steps with a railing? : A Little 6 Click Score: 14    End of Session Equipment Utilized During Treatment: Gait belt;Oxygen Activity Tolerance: Patient tolerated treatment well Patient left: in chair;with chair alarm set;with family/visitor present Nurse Communication:  Mobility status PT Visit Diagnosis: Muscle weakness (generalized) (M62.81);Difficulty in walking, not elsewhere classified (R26.2);Unsteadiness on feet (R26.81)    Time: 0459-9774 PT Time Calculation (min) (ACUTE ONLY): 39 min   Charges:   PT Evaluation $PT Eval Low Complexity: 1 Low PT Treatments $Gait Training: 8-22 mins $Therapeutic Exercise: 8-22 mins   PT G Codes:        Greggory Stallion, PT, DPT [phone]   Angeliyah Kirkey 06/06/2017, 1:13 PM

## 2017-06-06 NOTE — NC FL2 (Signed)
[place] LEVEL OF CARE SCREENING TOOL     IDENTIFICATION  Patient Name: Raymond Sampson Birthdate: [birth year] Sex: male Admission Date (Current Location): 06/05/2017  [place] and [place] Number:  Engineering geologist and Address:  [facility], [address] [place],  [address]      Provider Number: [id number]  Attending Physician Name and Address:  Gorden Harms, MD  Relative Name and Phone Number:       Current Level of Care: Hospital Recommended Level of Care: [place] Prior Approval Number:    Date Approved/Denied:   PASRR Number: [id number])  Discharge Plan: SNF    Current Diagnoses: Patient Active Problem List   Diagnosis Date Noted  . Flu 06/05/2017  . Diabetes mellitus type 2, controlled, with complications ([place]) 47/65/4650  . Carotid stenosis 05/14/2013  . CAROTID BRUIT, LEFT 04/25/2010  . PALPITATIONS 06/18/2009  . Hyperlipidemia 04/27/2009  . Essential hypertension 04/27/2009  . CAD, NATIVE VESSEL 04/27/2009    Orientation RESPIRATION BLADDER Height & Weight     Self, Time, Situation, Place  O2(2 Liters Oxygen. ) Incontinent Weight: 150 lb 6.4 oz (68.2 kg) Height:  5\' 7"  (170.2 cm)  BEHAVIORAL SYMPTOMS/MOOD NEUROLOGICAL BOWEL NUTRITION STATUS      Continent Diet(Diet: Heart Healthy/ Carb Modified. )  AMBULATORY STATUS COMMUNICATION OF NEEDS Skin   Extensive Assist Verbally Normal                       Personal Care Assistance Level of Assistance  Bathing, Feeding, Dressing Bathing Assistance: Limited assistance Feeding assistance: Independent Dressing Assistance: Limited assistance     Functional Limitations Info  Sight, Hearing, Speech Sight Info: Adequate Hearing Info: Adequate Speech Info: Adequate    SPECIAL CARE FACTORS FREQUENCY  PT (By licensed PT), OT (By licensed OT)     PT Frequency: (5) OT Frequency: (5)            Contractures       Additional Factors Info  Code Status, Allergies, Isolation Precautions Code Status Info: (Full Code. ) Allergies Info: (No Known Allergies. )     Isolation Precautions Info: (Droplet precautions, positive for flu. )     Current Medications (06/06/2017):  This is the current hospital active medication list Current Facility-Administered Medications  Medication Dose Route Frequency Provider Last Rate Last Dose  . 0.9 %  sodium chloride infusion   Intravenous Continuous Saundra Shelling, MD 75 mL/hr at 06/05/17 2152 75 mL/hr at 06/05/17 2152  . acetaminophen (TYLENOL) tablet 650 mg  650 mg Oral Q6H PRN Saundra Shelling, MD       Or  . acetaminophen (TYLENOL) suppository 650 mg  650 mg Rectal Q6H PRN Pyreddy, Reatha Harps, MD      . amLODipine (NORVASC) tablet 10 mg  10 mg Oral QPM Pyreddy, Reatha Harps, MD   10 mg at 06/05/17 2153  . aspirin EC tablet 81 mg  81 mg Oral QPM Saundra Shelling, MD   81 mg at 06/05/17 2152  . clopidogrel (PLAVIX) tablet 75 mg  75 mg Oral Q breakfast Saundra Shelling, MD   75 mg at 06/06/17 1015  . enoxaparin (LOVENOX) injection 40 mg  40 mg Subcutaneous Q24H Saundra Shelling, MD   40 mg at 06/05/17 2154  . gabapentin (NEURONTIN) capsule 100 mg  100 mg Oral QHS Saundra Shelling, MD   100 mg at 06/05/17 2153  . ipratropium-albuterol (DUONEB) 0.5-2.5 (3) MG/3ML nebulizer solution 3 mL  3 mL Nebulization QID Salary, Montell D, MD      . lisinopril (PRINIVIL,ZESTRIL) tablet 20 mg  20 mg Oral Daily Pyreddy, Pavan, MD   20 mg at 06/06/17 1014  . metoprolol tartrate (LOPRESSOR) tablet 25 mg  25 mg Oral BID Saundra Shelling, MD   25 mg at 06/06/17 1015  . ondansetron (ZOFRAN) tablet 4 mg  4 mg Oral Q6H PRN Pyreddy, Reatha Harps, MD       Or  . ondansetron (ZOFRAN) injection 4 mg  4 mg Intravenous Q6H PRN Pyreddy, Pavan, MD      . oseltamivir (TAMIFLU) capsule 30 mg  30 mg Oral BID Henreitta Leber, MD   30 mg at 06/06/17 1015  . senna-docusate (Senokot-S) tablet 1 tablet  1 tablet Oral QHS PRN Saundra Shelling, MD      . sertraline (ZOLOFT) tablet 25 mg  25 mg Oral QPM Pyreddy, Reatha Harps, MD   25 mg at 06/05/17 2153  . simvastatin (ZOCOR) tablet 20 mg  20 mg Oral QPM Saundra Shelling, MD   20 mg at 06/05/17 2152     Discharge Medications: Please see discharge summary for a list of discharge medications.  Relevant Imaging Results:  Relevant Lab Results:   Additional Information (SSN: [id number])  Murriel Holwerda, Veronia Beets, LCSW

## 2017-06-06 NOTE — Care Management Note (Addendum)
Case Management Note  Patient Details  Name: Raymond Sampson MRN: [id number] Date of Birth: 04/30/1940  Subjective/Objective: Admitted with the Flu and a fall. Lives at home with his wife. He has a walker. Pt recommending home health PT. Discuss recommendations with patient. He is agreeable to home health . Offered agencies. [facility] to [facility]. PCP is Dr. Ola Spurr.                     Action/Plan: [facility] for [facility]. Updated wife on POC.   Expected Discharge Date:                  Expected Discharge Plan:  [place]  In-House Referral:     Discharge planning Services  CM Consult  Post Acute Care Choice:  Home Health Choice offered to:  Patient  DME Arranged:    DME Agency:     HH Arranged:  PT [place]:  [place]  Status of Service:  In process, will continue to follow  If discussed at Long Length of Stay Meetings, dates discussed:    Additional Comments:  Jolly Mango, RN 06/06/2017, 1:42 PM

## 2017-06-07 MED ORDER — METOPROLOL TARTRATE 25 MG PO TABS: 25.0000 mg | ORAL_TABLET | Freq: Two times a day (BID) | ORAL | 0 refills | Status: DC

## 2017-06-07 MED ORDER — SENNOSIDES-DOCUSATE SODIUM 8.6-50 MG PO TABS: 1.0000 | ORAL_TABLET | Freq: Every evening | ORAL | 0 refills | Status: DC | PRN

## 2017-06-07 MED ORDER — OSELTAMIVIR PHOSPHATE 30 MG PO CAPS: 30.0000 mg | ORAL_CAPSULE | Freq: Two times a day (BID) | ORAL | 0 refills | Status: DC

## 2017-06-07 NOTE — Discharge Summary (Signed)
[name]    MR#:  [id number]  DATE OF BIRTH:  05-13-1940  DATE OF ADMISSION:  06/05/2017 ADMITTING PHYSICIAN: Saundra Shelling, MD  DATE OF DISCHARGE: No discharge date for patient encounter.  PRIMARY CARE PHYSICIAN: Leonel Ramsay, MD    ADMISSION DIAGNOSIS:  Weak [R53.1] Unable to walk [R26.2] Influenza [J11.1] Fall in home, initial encounter [W19.Merril Abbe, N82.956]  DISCHARGE DIAGNOSIS:  Active Problems:   Flu   SECONDARY DIAGNOSIS:   Past Medical History:  Diagnosis Date  . Arthritic-like pain   . CAD (coronary artery disease)    a. apical MI 5/07 with LHC showing 50% pCFX, 30% pRCA, 99% pLAD, 99% mLAD, 75% dLAD (cypher DES x3 to LAD); b. ETT-myoview (12/08) 81% MPHR,  EF 64%, partially reversible inferoapical perfusion defect similar to prior study 12/07; c. 04/2016 MV: EF 59%, fixed anteroapical defect, no ischemia->Low risk.  . Carotid arterial disease ([place])    a. 05/2016 Carotid U/S: 30-40% bilat dzs, f/u in 2 yrs.  . CVA (cerebral vascular accident) ([place])    a. 07/2010 - h/o CVA/TIA.  . Diet-controlled type 2 diabetes mellitus ([place])   . HLD (hyperlipidemia)   . HTN (hypertension)   . PAT (paroxysmal atrial tachycardia) ([place])    a. 06/2016 Event monitor:  rare episodes of atrial tachycardia, longest 7 beats. No afib.    HOSPITAL COURSE:  77 year old male patient with history of hypertension, hyperlipidemia, coronary artery disease, paroxysmal atrial tachycardia presented to the emergency room with weakness, cough congestion and fall.  1 acute influenza  Resolving Treated with Tamiflu with supportive care, IV fluids for rehydration  2 acute mechanical fall Evaluated by physical therapy while in house-recommended home health PT status post discharge   3 acute dehydration Resolved with IV fluids for rehydration  4 acute hyponatremia Repleted with IV fluids for  rehydration    DISCHARGE CONDITIONS:  On day of discharge patient is afebrile, hemodynamically stable, tolerating diet, ready for discharge home, for more specific details please see chart  CONSULTS OBTAINED:    DRUG ALLERGIES:  No Known Allergies  DISCHARGE MEDICATIONS:   Allergies as of 06/07/2017   No Known Allergies     Medication List    TAKE these medications   amLODipine 10 MG tablet Commonly known as:  NORVASC Take 1 tablet (10 mg total) by mouth daily.   aspirin 81 MG tablet Take 81 mg by mouth daily.   clopidogrel 75 MG tablet Commonly known as:  PLAVIX Take 1 tablet (75 mg total) by mouth daily with breakfast.   cyanocobalamin 1000 MCG tablet Take 1,000 mcg by mouth daily.   gabapentin 100 MG capsule Commonly known as:  NEURONTIN Take 100 mg by mouth at bedtime.   lisinopril 20 MG tablet Commonly known as:  PRINIVIL,ZESTRIL Take 20 mg by mouth daily.   metFORMIN 1000 MG tablet Commonly known as:  GLUCOPHAGE Take 1,000 mg by mouth 2 (two) times daily with a meal.   metoprolol tartrate 25 MG tablet Commonly known as:  LOPRESSOR Take 1 tablet (25 mg total) by mouth 2 (two) times daily. What changed:  Another medication with the same name was removed. Continue taking this medication, and follow the directions you see here.   oseltamivir 30 MG capsule Commonly known as:  TAMIFLU Take 1 capsule (30 mg total) by mouth 2 (two) times daily.   senna-docusate 8.6-50 MG tablet Commonly known as:  Senokot-S Take  1 tablet by mouth at bedtime as needed for mild constipation.   simvastatin 20 MG tablet Commonly known as:  ZOCOR TAKE 1 TABLET (20 MG TOTAL) BY MOUTH DAILY.   ZOLOFT 25 MG tablet Generic drug:  sertraline Take 1 tablet (25 mg total) by mouth daily.        DISCHARGE INSTRUCTIONS:   If you experience worsening of your admission symptoms, develop shortness of breath, life threatening emergency, suicidal or homicidal thoughts you must seek  medical attention immediately by calling 911 or calling your MD immediately  if symptoms less severe.  You Must read complete instructions/literature along with all the possible adverse reactions/side effects for all the Medicines you take and that have been prescribed to you. Take any new Medicines after you have completely understood and accept all the possible adverse reactions/side effects.   Please note  You were cared for by a hospitalist during your hospital stay. If you have any questions about your discharge medications or the care you received while you were in the hospital after you are discharged, you can call the unit and asked to speak with the hospitalist on call if the hospitalist that took care of you is not available. Once you are discharged, your primary care physician will handle any further medical issues. Please note that NO REFILLS for any discharge medications will be authorized once you are discharged, as it is imperative that you return to your primary care physician (or establish a relationship with a primary care physician if you do not have one) for your aftercare needs so that they can reassess your need for medications and monitor your lab values.    Today   CHIEF COMPLAINT:   Chief Complaint  Patient presents with  . Fall    HISTORY OF PRESENT ILLNESS:  77 y.o. male with a known history of coronary artery disease, carotid arterial disease hyperlipidemia, hypertension presented to the emergency room for fall at home.  Patient legs felt weak and they gave away.  No history of any head injury.  No loss of consciousness.  He has cough and congestion and body aches going on for the last few days.  Flu test was positive in the emergency roompatient was given Tamiflu..Imaging studies did not reveal any fracture .  Patient unable to get up and ambulate because of weakness. Hospitalist service was consulted for further care.  VITAL SIGNS:  Blood pressure 131/64, pulse  68, temperature 98.9 F (37.2 C), temperature source Oral, resp. rate 17, height 5\' 7"  (1.702 m), weight 68.2 kg (150 lb 6.4 oz), SpO2 93 %.  I/O:    Intake/Output Summary (Last 24 hours) at 06/07/2017 1045 Last data filed at 06/07/2017 0937 Gross per 24 hour  Intake 1976.25 ml  Output 675 ml  Net 1301.25 ml    PHYSICAL EXAMINATION:  GENERAL:  77 y.o.-year-old patient lying in the bed with no acute distress.  EYES: Pupils equal, round, reactive to light and accommodation. No scleral icterus. Extraocular muscles intact.  HEENT: Head atraumatic, normocephalic. Oropharynx and nasopharynx clear.  NECK:  Supple, no jugular venous distention. No thyroid enlargement, no tenderness.  LUNGS: Normal breath sounds bilaterally, no wheezing, rales,rhonchi or crepitation. No use of accessory muscles of respiration.  CARDIOVASCULAR: S1, S2 normal. No murmurs, rubs, or gallops.  ABDOMEN: Soft, non-tender, non-distended. Bowel sounds present. No organomegaly or mass.  EXTREMITIES: No pedal edema, cyanosis, or clubbing.  NEUROLOGIC: Cranial nerves II through XII are intact. Muscle strength 5/5 in all  extremities. Sensation intact. Gait not checked.  PSYCHIATRIC: The patient is alert and oriented x 3.  SKIN: No obvious rash, lesion, or ulcer.   DATA REVIEW:   CBC Recent Labs  Lab 06/06/17 0314  WBC 7.7  HGB 12.7*  HCT 38.4*  PLT 225    Chemistries  Recent Labs  Lab 06/05/17 1025 06/06/17 0314  NA 134* 135  K 4.1 3.7  CL 98* 102  CO2 25 22  GLUCOSE 185* 158*  BUN 23* 26*  CREATININE 1.16 1.13  CALCIUM 9.4 8.4*  AST 52*  --   ALT 20  --   ALKPHOS 58  --   BILITOT 1.1  --     Cardiac Enzymes Recent Labs  Lab 06/05/17 1615  TROPONINI 0.03*    Microbiology Results  No results found for this or any previous visit.  RADIOLOGY:  Dg Chest 2 View  Result Date: 06/05/2017 CLINICAL DATA:  Witnessed fall yesterday. Now with confusion. Former smoker. History of coronary artery  disease and stent placement. EXAM: CHEST  2 VIEW COMPARISON:  Chest x-ray of July 28, 2010 FINDINGS: The lungs are well-expanded. There is no focal infiltrate. There is no pleural effusion or pneumothorax. The heart and pulmonary vascularity are normal. A coronary artery stent is visible. The mediastinum is normal in width. The bony thorax exhibits no acute abnormality. IMPRESSION: There is no active cardiopulmonary disease. A coronary artery stent is present. Electronically Signed   By: David  Martinique M.D.   On: 06/05/2017 14:10   Dg Lumbar Spine Complete  Result Date: 06/05/2017 CLINICAL DATA:  Unwitnessed fall yesterday.  Low back pain. EXAM: LUMBAR SPINE - COMPLETE 4+ VIEW COMPARISON:  None in PACs FINDINGS: The lumbar vertebral bodies are preserved in height. There is anterior endplate spurring at J2-4, L3-4, and L4-5. The disc space heights are well maintained. There is mild facet joint hypertrophy from L3 through S1. There is no spondylolisthesis. The pedicles and transverse processes are intact. The transverse processes of L1 are transitional. IMPRESSION: There is mild multilevel degenerative endplate spurring and facet joint hypertrophy. There is no compression fracture, spondylolisthesis, nor high-grade disc space narrowing. Electronically Signed   By: David  Martinique M.D.   On: 06/05/2017 14:13   Ct Head Wo Contrast  Result Date: 06/05/2017 CLINICAL DATA:  Unwitnessed fall yesterday.  Confusion. EXAM: CT HEAD WITHOUT CONTRAST CT CERVICAL SPINE WITHOUT CONTRAST TECHNIQUE: Multidetector CT imaging of the head and cervical spine was performed following the standard protocol without intravenous contrast. Multiplanar CT image reconstructions of the cervical spine were also generated. COMPARISON:  12/27/2012 head CT.  08/06/2012 cervical spine CT. FINDINGS: CT HEAD FINDINGS Brain: No evidence of parenchymal hemorrhage or extra-axial fluid collection. No mass lesion, mass effect, or midline shift. No CT  evidence of acute infarction. Stable encephalomalacia in the right frontal and left parietal lobes. Stable mild prominence of the CSF space in posterior aspect of the posterior fossa compatible with small recommend cyst. Nonspecific moderate subcortical and periventricular white matter hypodensity, most in keeping with chronic small vessel ischemic change. No acute ventriculomegaly. Vascular: No acute abnormality. Skull: No evidence of calvarial fracture. Sinuses/Orbits: No fluid levels. Mucoperiosteal thickening in bilateral ethmoidal air cells and left maxillary sinus. Other:  The mastoid air cells are unopacified. CT CERVICAL SPINE FINDINGS Alignment: Normal cervical lordosis. No facet subluxation. Dens is well positioned between the lateral masses of C1. Skull base and vertebrae: No acute fracture. No primary bone lesion or focal pathologic process. Soft  tissues and spinal canal: No prevertebral edema. No visible canal hematoma. Disc levels: Status post ACDF C3-C6, with no evidence hardware fracture or loosening. There is bony fusion across the C3-4, C4-5 and C5-6 disc spaces. Mild degenerative disc disease in the remaining cervical disc levels. Mild bilateral facet arthropathy. Mild degenerative foraminal stenosis bilaterally at C3-4. Moderate degenerate foraminal stenosis on the left at C4-5 and on the right at C5-6. Upper chest: No acute abnormality. Other: Visualized mastoid air cells appear clear. Heterogeneous thyroid gland without discrete thyroid nodules. No pathologically enlarged cervical nodes. IMPRESSION: CT HEAD: 1. No evidence of acute intracranial abnormality. Evidence of calvarial fracture. 2. Stable right frontal and left parietal encephalomalacia. 3. Moderate chronic small vessel ischemic changes in the cerebral white matter. 4. Mild paranasal sinusitis, chronic appearing. CT CERVICAL SPINE: 1. No cervical spine fracture or subluxation. 2. ACDF C3-C6, with no evidence of hardware complication.  3. Mild-to-moderate multilevel degenerative changes in the cervical spine as detailed. Electronically Signed   By: Ilona Sorrel M.D.   On: 06/05/2017 13:51   Ct Cervical Spine Wo Contrast  Result Date: 06/05/2017 CLINICAL DATA:  Unwitnessed fall yesterday.  Confusion. EXAM: CT HEAD WITHOUT CONTRAST CT CERVICAL SPINE WITHOUT CONTRAST TECHNIQUE: Multidetector CT imaging of the head and cervical spine was performed following the standard protocol without intravenous contrast. Multiplanar CT image reconstructions of the cervical spine were also generated. COMPARISON:  12/27/2012 head CT.  08/06/2012 cervical spine CT. FINDINGS: CT HEAD FINDINGS Brain: No evidence of parenchymal hemorrhage or extra-axial fluid collection. No mass lesion, mass effect, or midline shift. No CT evidence of acute infarction. Stable encephalomalacia in the right frontal and left parietal lobes. Stable mild prominence of the CSF space in posterior aspect of the posterior fossa compatible with small recommend cyst. Nonspecific moderate subcortical and periventricular white matter hypodensity, most in keeping with chronic small vessel ischemic change. No acute ventriculomegaly. Vascular: No acute abnormality. Skull: No evidence of calvarial fracture. Sinuses/Orbits: No fluid levels. Mucoperiosteal thickening in bilateral ethmoidal air cells and left maxillary sinus. Other:  The mastoid air cells are unopacified. CT CERVICAL SPINE FINDINGS Alignment: Normal cervical lordosis. No facet subluxation. Dens is well positioned between the lateral masses of C1. Skull base and vertebrae: No acute fracture. No primary bone lesion or focal pathologic process. Soft tissues and spinal canal: No prevertebral edema. No visible canal hematoma. Disc levels: Status post ACDF C3-C6, with no evidence hardware fracture or loosening. There is bony fusion across the C3-4, C4-5 and C5-6 disc spaces. Mild degenerative disc disease in the remaining cervical disc  levels. Mild bilateral facet arthropathy. Mild degenerative foraminal stenosis bilaterally at C3-4. Moderate degenerate foraminal stenosis on the left at C4-5 and on the right at C5-6. Upper chest: No acute abnormality. Other: Visualized mastoid air cells appear clear. Heterogeneous thyroid gland without discrete thyroid nodules. No pathologically enlarged cervical nodes. IMPRESSION: CT HEAD: 1. No evidence of acute intracranial abnormality. Evidence of calvarial fracture. 2. Stable right frontal and left parietal encephalomalacia. 3. Moderate chronic small vessel ischemic changes in the cerebral white matter. 4. Mild paranasal sinusitis, chronic appearing. CT CERVICAL SPINE: 1. No cervical spine fracture or subluxation. 2. ACDF C3-C6, with no evidence of hardware complication. 3. Mild-to-moderate multilevel degenerative changes in the cervical spine as detailed. Electronically Signed   By: Ilona Sorrel M.D.   On: 06/05/2017 13:51    EKG:   Orders placed or performed during the hospital encounter of 06/05/17  . ED EKG  .  ED EKG  . EKG 12-Lead  . EKG 12-Lead      Management plans discussed with the patient, family and they are in agreement.  CODE STATUS:     Code Status Orders  (From admission, onward)        Start     Ordered   06/05/17 2016  Full code  Continuous     06/05/17 2015    Code Status History    Date Active Date Inactive Code Status Order ID Comments User Context   This patient has a current code status but no historical code status.      TOTAL TIME TAKING CARE OF THIS PATIENT: 45 minutes.    Avel Peace Sindi Beckworth M.D on 06/07/2017 at 10:45 AM  Between 7am to 6pm - Pager - [phone]  After 6pm go to [email or web address] - password [email or web address] [facility]  Office  [phone]  CC: Primary care physician; Leonel Ramsay, MD   Note: This dictation was prepared with Dragon dictation along with smaller phrase technology. Any transcriptional errors  that result from this process are unintentional.

## 2017-06-07 NOTE — Progress Notes (Signed)
Discharge instructions reviewed with the patient and his wife.  [facility].  Patient will take original copy when he picks up his meds.  Patient sent out via wheelchair to waiting car

## 2017-06-07 NOTE — Care Management Note (Signed)
Case Management Note  Patient Details  Name: MINA CARLISI MRN: [id number] Date of Birth: 12-May-1940  Subjective/Objective:   Discharging today                 Action/Plan: Notified [facility] of discharge.   Expected Discharge Date:  06/07/17               Expected Discharge Plan:  [place]  In-House Referral:     Discharge planning Services  CM Consult  Post Acute Care Choice:  Home Health Choice offered to:  Patient  DME Arranged:    DME Agency:     HH Arranged:  PT [place]:  [place]  Status of Service:  Completed, signed off  If discussed at [place] of Stay Meetings, dates discussed:    Additional Comments:  Jolly Mango, RN 06/07/2017, 1:37 PM

## 2017-06-11 ENCOUNTER — Telehealth: Payer: Self-pay | Admitting: Cardiovascular Disease

## 2017-06-11 DIAGNOSIS — Z7982 Long term (current) use of aspirin: Secondary | ICD-10-CM | POA: Diagnosis not present

## 2017-06-11 DIAGNOSIS — I2510 Atherosclerotic heart disease of native coronary artery without angina pectoris: Secondary | ICD-10-CM | POA: Diagnosis not present

## 2017-06-11 DIAGNOSIS — Z87891 Personal history of nicotine dependence: Secondary | ICD-10-CM | POA: Diagnosis not present

## 2017-06-11 DIAGNOSIS — I10 Essential (primary) hypertension: Secondary | ICD-10-CM | POA: Diagnosis not present

## 2017-06-11 DIAGNOSIS — E785 Hyperlipidemia, unspecified: Secondary | ICD-10-CM | POA: Diagnosis not present

## 2017-06-11 DIAGNOSIS — E119 Type 2 diabetes mellitus without complications: Secondary | ICD-10-CM | POA: Diagnosis not present

## 2017-06-11 DIAGNOSIS — Z955 Presence of coronary angioplasty implant and graft: Secondary | ICD-10-CM | POA: Diagnosis not present

## 2017-06-11 DIAGNOSIS — Z7984 Long term (current) use of oral hypoglycemic drugs: Secondary | ICD-10-CM | POA: Diagnosis not present

## 2017-06-11 DIAGNOSIS — I6529 Occlusion and stenosis of unspecified carotid artery: Secondary | ICD-10-CM | POA: Diagnosis not present

## 2017-06-11 DIAGNOSIS — J101 Influenza due to other identified influenza virus with other respiratory manifestations: Secondary | ICD-10-CM | POA: Diagnosis not present

## 2017-06-11 NOTE — Telephone Encounter (Signed)
Raymond Sampson a Physical Therapist with [facility] is calling States the patient has a level 1 med interaction between amLODipine and simvastatin Needed to make doctor aware and would like to know of any changes Please advise

## 2017-06-11 NOTE — Telephone Encounter (Signed)
Spoke with Raquel Sarna and she wanted to let us know there was a listed interaction with amlodipine and simvastatin. Reviewed that I see it listed and no changes at this time. Will make provider aware and see if any changes are needed.

## 2017-06-12 DIAGNOSIS — I951 Orthostatic hypotension: Secondary | ICD-10-CM | POA: Diagnosis not present

## 2017-06-12 DIAGNOSIS — J111 Influenza due to unidentified influenza virus with other respiratory manifestations: Secondary | ICD-10-CM | POA: Diagnosis not present

## 2017-06-12 DIAGNOSIS — I10 Essential (primary) hypertension: Secondary | ICD-10-CM | POA: Diagnosis not present

## 2017-07-11 ENCOUNTER — Other Ambulatory Visit: Payer: Self-pay | Admitting: Cardiovascular Disease

## 2017-07-11 DIAGNOSIS — J101 Influenza due to other identified influenza virus with other respiratory manifestations: Secondary | ICD-10-CM | POA: Diagnosis not present

## 2017-07-11 DIAGNOSIS — I6529 Occlusion and stenosis of unspecified carotid artery: Secondary | ICD-10-CM | POA: Diagnosis not present

## 2017-07-11 DIAGNOSIS — E785 Hyperlipidemia, unspecified: Secondary | ICD-10-CM | POA: Diagnosis not present

## 2017-07-11 DIAGNOSIS — I2510 Atherosclerotic heart disease of native coronary artery without angina pectoris: Secondary | ICD-10-CM | POA: Diagnosis not present

## 2017-07-11 DIAGNOSIS — E119 Type 2 diabetes mellitus without complications: Secondary | ICD-10-CM | POA: Diagnosis not present

## 2017-07-11 DIAGNOSIS — Z955 Presence of coronary angioplasty implant and graft: Secondary | ICD-10-CM | POA: Diagnosis not present

## 2017-07-11 DIAGNOSIS — I10 Essential (primary) hypertension: Secondary | ICD-10-CM | POA: Diagnosis not present

## 2017-07-11 DIAGNOSIS — Z87891 Personal history of nicotine dependence: Secondary | ICD-10-CM | POA: Diagnosis not present

## 2017-07-25 DIAGNOSIS — E119 Type 2 diabetes mellitus without complications: Secondary | ICD-10-CM | POA: Diagnosis not present

## 2017-07-25 DIAGNOSIS — E7800 Pure hypercholesterolemia, unspecified: Secondary | ICD-10-CM | POA: Diagnosis not present

## 2017-07-25 DIAGNOSIS — I10 Essential (primary) hypertension: Secondary | ICD-10-CM | POA: Diagnosis not present

## 2017-08-01 DIAGNOSIS — F329 Major depressive disorder, single episode, unspecified: Secondary | ICD-10-CM | POA: Diagnosis not present

## 2017-08-01 DIAGNOSIS — I10 Essential (primary) hypertension: Secondary | ICD-10-CM | POA: Diagnosis not present

## 2017-08-01 DIAGNOSIS — E119 Type 2 diabetes mellitus without complications: Secondary | ICD-10-CM | POA: Diagnosis not present

## 2017-08-01 DIAGNOSIS — E7800 Pure hypercholesterolemia, unspecified: Secondary | ICD-10-CM | POA: Diagnosis not present

## 2017-09-12 ENCOUNTER — Other Ambulatory Visit: Payer: Self-pay | Admitting: Nurse Practitioner

## 2017-10-25 ENCOUNTER — Other Ambulatory Visit: Payer: Self-pay | Admitting: Cardiovascular Disease

## 2017-10-25 ENCOUNTER — Telehealth: Payer: Self-pay | Admitting: Cardiovascular Disease

## 2017-10-25 NOTE — Telephone Encounter (Signed)
-----   Message from Janan Ridge, [place] sent at 10/25/2017  9:43 AM EDT ----- Regarding: Patient needs an appointment  Can you please try to contact patient to schedule a follow up appointment.   Thanks !

## 2017-10-25 NOTE — Telephone Encounter (Signed)
Lm with Wife.  She will call back to reschedule .

## 2017-10-29 NOTE — Telephone Encounter (Signed)
Pt scheduled for 11/22/17

## 2017-10-30 ENCOUNTER — Other Ambulatory Visit: Payer: Self-pay

## 2017-10-30 ENCOUNTER — Telehealth: Payer: Self-pay | Admitting: Cardiovascular Disease

## 2017-10-30 MED ORDER — CLOPIDOGREL BISULFATE 75 MG PO TABS: 75.0000 mg | ORAL_TABLET | Freq: Every day | ORAL | 0 refills | Status: DC

## 2017-10-30 NOTE — Telephone Encounter (Signed)
clopidogrel (PLAVIX) 75 MG tablet 90 tablet 0 10/30/2017    Sig - Route: Take 1 tablet (75 mg total) by mouth daily with breakfast. - Oral   Sent to pharmacy as: clopidogrel (PLAVIX) 75 MG tablet   E-Prescribing Status: Receipt confirmed by pharmacy (10/30/2017 4:56 PM EDT)   Pharmacy   [id number] - [place], [place] - [address]

## 2017-10-30 NOTE — Telephone Encounter (Signed)
°*  STAT* If patient is at the pharmacy, call can be transferred to refill team.   1. Which medications need to be refilled? (please list name of each medication and dose if known) Clopidogrel   2. Which pharmacy/location (including street and city if local pharmacy) is medication [facility] be sent to[facility] [place]   3. Do they need a 30 day or 90 day supply? 90 day   PCP is no longer able to send this in, for he retired. New pcp is not able to see them until Nov 2019 Was hoping we can send it in until then   Please advise

## 2017-11-21 NOTE — Progress Notes (Signed)
Cardiology Office Note  Date:  11/22/2017   ID:  Raymond Sampson, DOB 23-Dec-1940, MRN [id number]  PCP:  [name], MD   Chief Complaint  Patient presents with  . other    6 month follow up. Meds reviewed by the pt. verbally. Pt. c/o cramps in feet and tingling in face at times.     HPI:  77 yo male with history of  CAD   apical MI in 5/07 with 3 stents placed in the LAD.   h/o CVA and TIA  07/2010, bilateral carotid disease, right side, 50%, 60% on the left, 05/2016 Prior history of smoking for 30 years, stopped smoking 25 years ago Dementia Who presents for routine follow-up of his coronary artery disease   underlying dementia Wife is retired Now she is planning on getting him more active He sits most of the day no regular activities Unable to walk very far Some dietary noncompliance Denies any palpitations ,previously reported having some palpitations on prior office visit No regular exercise No chest pain on exertion  followed by neurology, [name]  stroke history 2012, etiology unclear.   Lab work discussed with him Poor diet HAB1C 7.2 Total chol 148 LDL 70   Tolerating simvastatin 20 mg daily  blood pressure well controlled at home  Event monitor  rare episodes of atrial tachycardia, longest was 7 beats Rare ectopy  EKG personally reviewed by myself on todays visit Shows normal sinus rhythm rate 64 bpm, no significant ST or T-wave changes  Other past medical history stress test 12/08  showed a partially reversible inferoapical defect similar to prior stress in 12/07.  Echo in 3/11 showed preserved EF.   PMH:   has a past medical history of Arthritic-like pain, CAD (coronary artery disease), Carotid arterial disease ([place]), CVA (cerebral vascular accident) ([place]), Diet-controlled type 2 diabetes mellitus ([place]), HLD (hyperlipidemia), HTN (hypertension), and PAT (paroxysmal atrial tachycardia) ([place]).  PSH:    Past Surgical History:  Procedure  Laterality Date  . BACK SURGERY    . CARDIAC CATHETERIZATION    . CORONARY ANGIOPLASTY WITH STENT PLACEMENT     apical MI 5/07 with LHC showing 50% pCFX, 30% pRCA, 99% pLAD, 9%% mLAD, 75% dLAD. cypher DES x3 to LAD. ETT-myoview (12/08) 81% MPHR, 8'1, EF 64%, partially reversible inferoapical perfusion defect similar to prior study 12/07    Current Outpatient Medications  Medication Sig Dispense Refill  . amLODipine (NORVASC) 10 MG tablet TAKE 1 TABLET BY MOUTH EVERY DAY 30 tablet 3  . aspirin 81 MG tablet Take 81 mg by mouth daily.     . clopidogrel (PLAVIX) 75 MG tablet Take 1 tablet (75 mg total) by mouth daily with breakfast. 90 tablet 0  . cyanocobalamin 1000 MCG tablet Take 1,000 mcg by mouth daily.    Marland Kitchen gabapentin (NEURONTIN) 100 MG capsule Take 100 mg by mouth at bedtime.    Marland Kitchen lisinopril (PRINIVIL,ZESTRIL) 20 MG tablet Take 20 mg by mouth daily.    . metFORMIN (GLUCOPHAGE) 1000 MG tablet Take 1,000 mg by mouth 2 (two) times daily with a meal.    . metoprolol tartrate (LOPRESSOR) 25 MG tablet TAKE 1 TABLET BY MOUTH TWICE A DAY 60 tablet 0  . senna-docusate (SENOKOT-S) 8.6-50 MG tablet Take 1 tablet by mouth at bedtime as needed for mild constipation. 30 tablet 0  . sertraline (ZOLOFT) 25 MG tablet Take 1 tablet (25 mg total) by mouth daily.    . simvastatin (ZOCOR) 20 MG  tablet TAKE 1 TABLET (20 MG TOTAL) BY MOUTH DAILY. 90 tablet 0   No current facility-administered medications for this visit.      Allergies:   Simvastatin   Social History:  The patient  reports that he quit smoking about 32 years ago. His smoking use included cigarettes. He has a 25.00 pack-year smoking history. He has never used smokeless tobacco. He reports that he does not drink alcohol or use drugs.   Family History:   Family history is unknown by patient.    Review of Systems: Review of Systems  Constitutional: Negative.   Respiratory: Negative.   Cardiovascular: Negative.   Gastrointestinal:  Negative.   Musculoskeletal: Negative.   Neurological: Negative.   Psychiatric/Behavioral: Negative.   All other systems reviewed and are negative.    PHYSICAL EXAM: VS:  BP (!) 142/62 (BP Location: Left Arm, Patient Position: Sitting, Cuff Size: Normal)   Pulse 64   Wt 157 lb (71.2 kg)   BMI 24.59 kg/m  , BMI Body mass index is 24.59 kg/m. Constitutional:  oriented to person, place, and time. No distress.  HENT:  Head: Normocephalic and atraumatic.  Eyes:  no discharge. No scleral icterus.  Neck: Normal range of motion. Neck supple. No JVD present.  Cardiovascular: Normal rate, regular rhythm, normal heart sounds and intact distal pulses. Exam reveals no gallop and no friction rub. No edema No murmur heard. Pulmonary/Chest: Effort normal and breath sounds normal. No stridor. No respiratory distress.  no wheezes.  no rales.  no tenderness.  Abdominal: Soft.  no distension.  no tenderness.  Musculoskeletal: Normal range of motion.  no  tenderness or deformity.  Neurological:  normal muscle tone. Coordination normal. No atrophy Skin: Skin is warm and dry. No rash noted. not diaphoretic.  Psychiatric:  normal mood and affect. behavior is normal. Thought content normal.     Recent Labs: 06/05/2017: ALT 20 06/06/2017: BUN 26; Creatinine, Ser 1.13; Hemoglobin 12.7; Platelets 225; Potassium 3.7; Sodium 135    Lipid Panel Lab Results  Component Value Date   CHOL 138 12/28/2012   HDL 34 (L) 12/28/2012   LDLCALC 65 12/28/2012   TRIG 197 12/28/2012      Wt Readings from Last 3 Encounters:  11/22/17 157 lb (71.2 kg)  06/05/17 150 lb 6.4 oz (68.2 kg)  11/01/16 165 lb 8 oz (75.1 kg)       ASSESSMENT AND PLAN:  Atherosclerosis of native coronary artery of native heart without angina pectoris -  Currently with no symptoms of angina. No further workup at this time. Continue current medication regimen.stable Low risk scan January 2018 No ischemia  Atrial flutter, unspecified  type ([place]) -  Denies any palpitations or tachycardia Event monitor Shows rare episodes of atrial tachycardia, longest was 7 beats Rare ectopy  Stenosis of carotid artery, unspecified laterality - Stable disease mild to moderate Stressed importance of aggressive diabetes control  Palpitations - Rare episodes of atrial tachycardia seen on previous event monitor  Essential hypertension Blood pressure is well controlled on today's visit. No changes made to the medications.   Total encounter time more than 25 minutes  Greater than 50% was spent in counseling and coordination of care with the patient   Disposition:   F/U  12 months   Orders Placed This Encounter  Procedures  . EKG 12-Lead     Signed, Esmond Plants, M.D., Ph.D. 11/22/2017  [place], [place]

## 2017-11-22 ENCOUNTER — Ambulatory Visit: Payer: PPO | Admitting: Cardiovascular Disease

## 2017-11-22 ENCOUNTER — Encounter: Payer: Self-pay | Admitting: Cardiovascular Disease

## 2017-11-22 VITALS — BP 142/62 | HR 64 | Wt 157.0 lb

## 2017-11-22 DIAGNOSIS — E118 Type 2 diabetes mellitus with unspecified complications: Secondary | ICD-10-CM

## 2017-11-22 DIAGNOSIS — I4892 Unspecified atrial flutter: Secondary | ICD-10-CM

## 2017-11-22 DIAGNOSIS — I10 Essential (primary) hypertension: Secondary | ICD-10-CM

## 2017-11-22 DIAGNOSIS — I25118 Atherosclerotic heart disease of native coronary artery with other forms of angina pectoris: Secondary | ICD-10-CM

## 2017-11-22 DIAGNOSIS — I6523 Occlusion and stenosis of bilateral carotid arteries: Secondary | ICD-10-CM

## 2017-11-22 DIAGNOSIS — E782 Mixed hyperlipidemia: Secondary | ICD-10-CM

## 2017-11-22 DIAGNOSIS — I739 Peripheral vascular disease, unspecified: Secondary | ICD-10-CM

## 2017-11-22 NOTE — Patient Instructions (Signed)

## 2017-12-02 ENCOUNTER — Other Ambulatory Visit: Payer: Self-pay | Admitting: Cardiovascular Disease

## 2018-01-20 ENCOUNTER — Other Ambulatory Visit: Payer: Self-pay | Admitting: Cardiovascular Disease

## 2018-01-22 ENCOUNTER — Other Ambulatory Visit: Payer: Self-pay | Admitting: Cardiovascular Disease

## 2018-01-23 ENCOUNTER — Other Ambulatory Visit: Payer: Self-pay | Admitting: Cardiovascular Disease

## 2018-01-23 ENCOUNTER — Telehealth: Payer: Self-pay | Admitting: Cardiovascular Disease

## 2018-01-23 NOTE — Telephone Encounter (Signed)
° °  [facility] Pre-operative Risk Assessment    Request for surgical clearance:  1. What type of surgery is being performed? Removal of Maxillary right canine   2. When is this surgery scheduled?   3. What type of clearance is required (medical clearance vs. Pharmacy clearance to hold med vs. Both)?  Both   4. Are there any medications that need to be held prior to surgery and how long? Coumadin 3 days prior to be held   5. Practice name and name of physician performing surgery? Dr Leighton Roach DDS  6. What is your office phone number [phone]   7.   What is your office fax number [phone]  8.   Anesthesia type (None, local, MAC, general) ?

## 2018-01-25 DIAGNOSIS — E119 Type 2 diabetes mellitus without complications: Secondary | ICD-10-CM | POA: Diagnosis not present

## 2018-01-25 DIAGNOSIS — I10 Essential (primary) hypertension: Secondary | ICD-10-CM | POA: Diagnosis not present

## 2018-01-25 DIAGNOSIS — E7800 Pure hypercholesterolemia, unspecified: Secondary | ICD-10-CM | POA: Diagnosis not present

## 2018-01-27 NOTE — Telephone Encounter (Signed)
  Preop note there is warfarin that he is not on that medication He is on aspirin Plavix Okay to stop Plavix 3 days prior to procedure  restart following procedure

## 2018-01-28 NOTE — Telephone Encounter (Signed)
Routed to number provided via [facility] fax.  

## 2018-02-04 DIAGNOSIS — Z0000 Encounter for general adult medical examination without abnormal findings: Secondary | ICD-10-CM | POA: Diagnosis not present

## 2018-02-04 DIAGNOSIS — E119 Type 2 diabetes mellitus without complications: Secondary | ICD-10-CM | POA: Diagnosis not present

## 2018-02-04 DIAGNOSIS — E7800 Pure hypercholesterolemia, unspecified: Secondary | ICD-10-CM | POA: Diagnosis not present

## 2018-02-04 DIAGNOSIS — Z8673 Personal history of transient ischemic attack (TIA), and cerebral infarction without residual deficits: Secondary | ICD-10-CM | POA: Diagnosis not present

## 2018-02-04 DIAGNOSIS — I259 Chronic ischemic heart disease, unspecified: Secondary | ICD-10-CM | POA: Diagnosis not present

## 2018-02-04 DIAGNOSIS — G250 Essential tremor: Secondary | ICD-10-CM | POA: Diagnosis not present

## 2018-02-04 DIAGNOSIS — I10 Essential (primary) hypertension: Secondary | ICD-10-CM | POA: Diagnosis not present

## 2018-04-11 IMAGING — CT CT CERVICAL SPINE W/O CM
3 of 7 series · 12 of 33 positions shown, 13 images · non-contrast
Comparison: 12/27/2012 head CT.  08/06/2012 cervical spine CT.

CLINICAL DATA: Unwitnessed fall yesterday.  Confusion.

EXAM:
CT HEAD WITHOUT CONTRAST
CT CERVICAL SPINE WITHOUT CONTRAST
TECHNIQUE: Multidetector CT imaging of the head and cervical spine was
performed following the standard protocol without intravenous
contrast. Multiplanar CT image reconstructions of the cervical spine
were also generated.

[Series 4: coronal soft tissue · coronal · 0.35mm/px · 3 of 68 slices shown]
[im 17/68  bone]
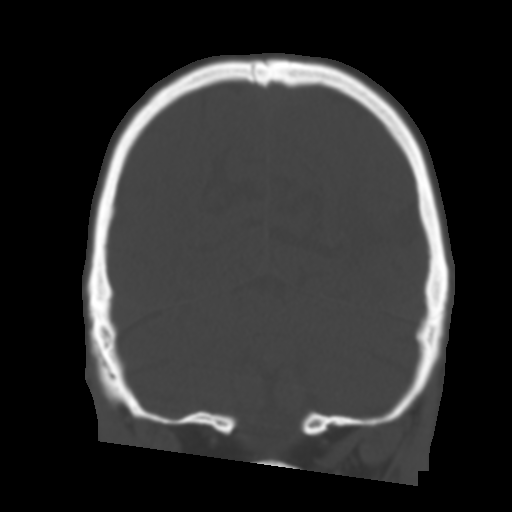
[im 34/68  bone]
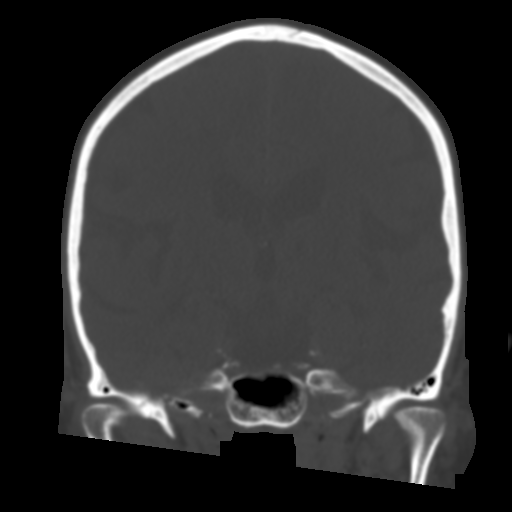
[im 51/68  bone]
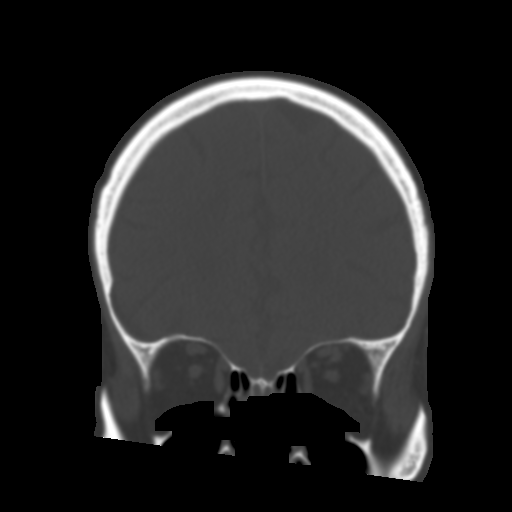

[Series 10: sagittal bone · sagittal · 0.28mm/px · 5 of 58 slices shown]
[im 10/58  bone]
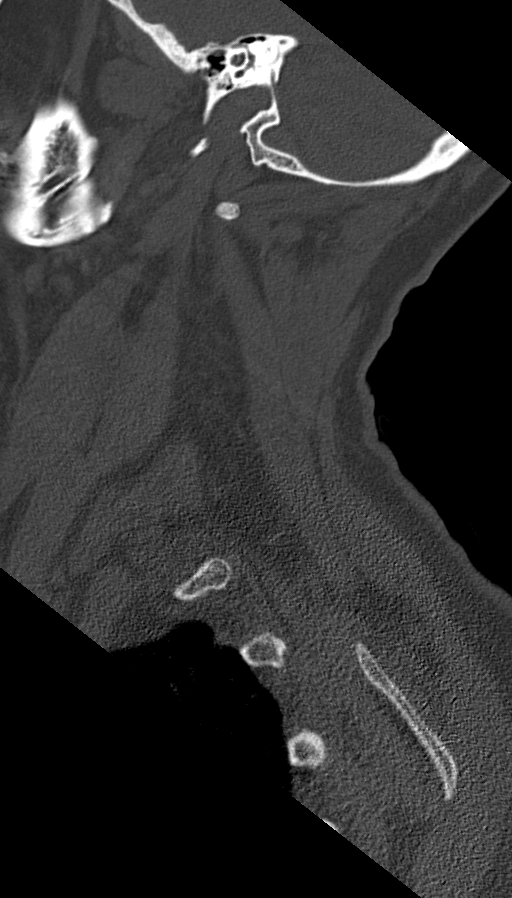
[im 20/58  bone]
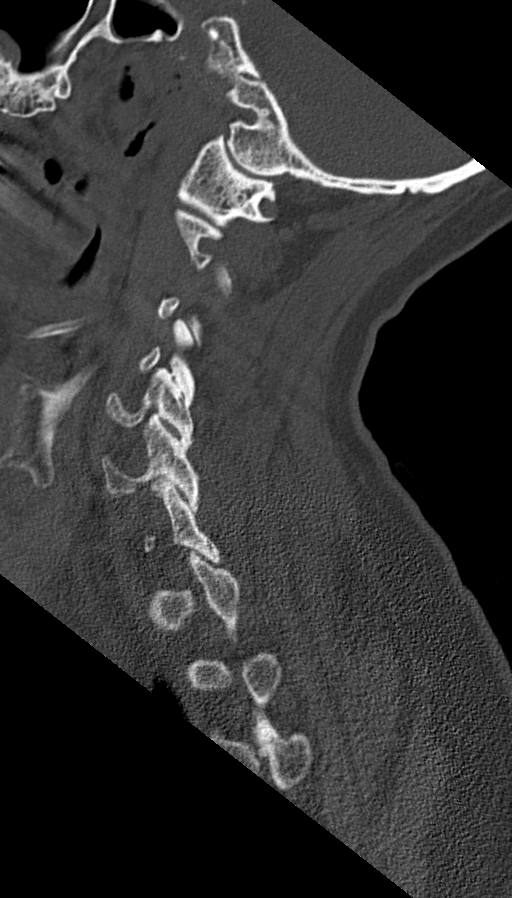
[im 29/58  bone]
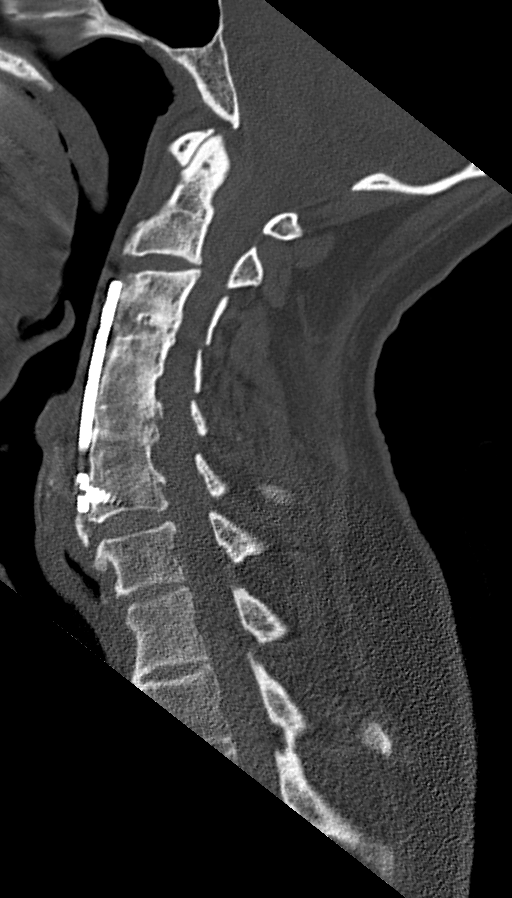
[im 39/58  bone]
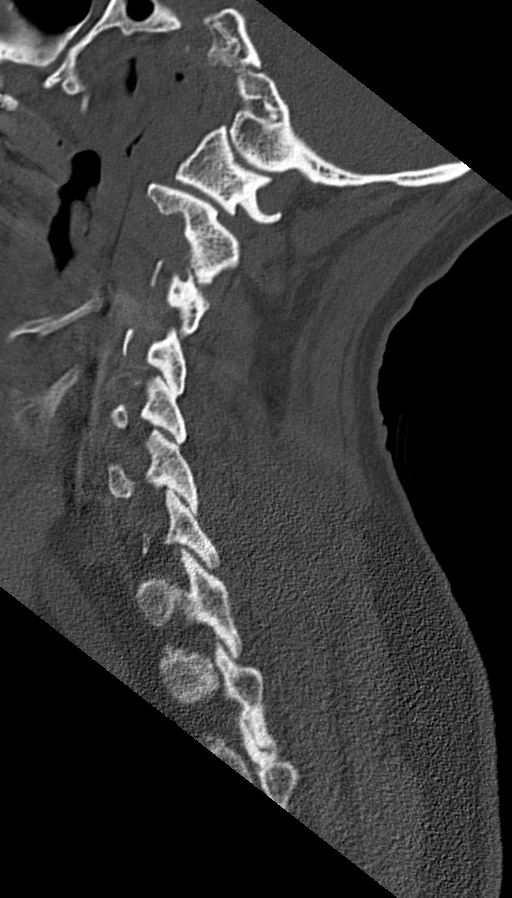
[im 48/58  bone]
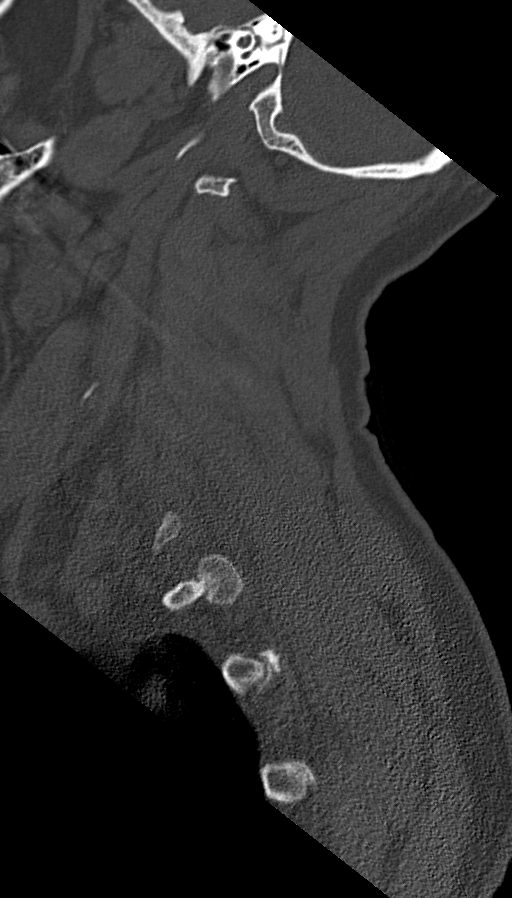

[Series 12: orthogonal bone · axial · 0.24mm/px · z∈[+285,+408]mm · 4 of 112 slices shown, 5 images]
[im 19/112  soft-tissue]
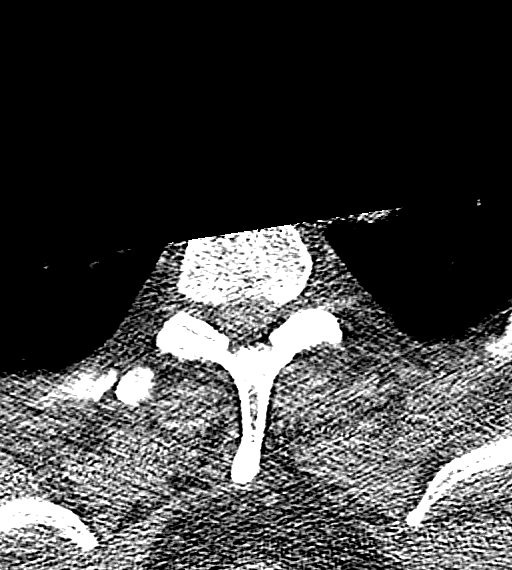
[im 19/112  bone]
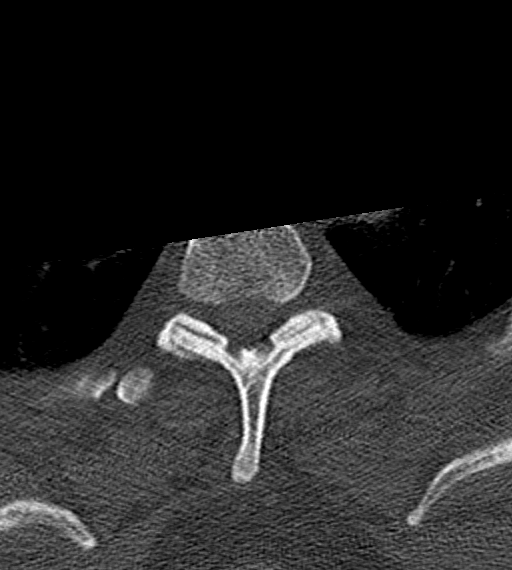
[im 38/112  bone]
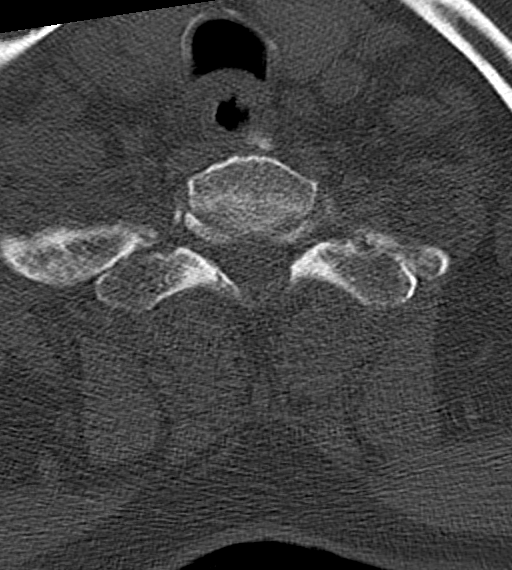
[im 75/112  bone]
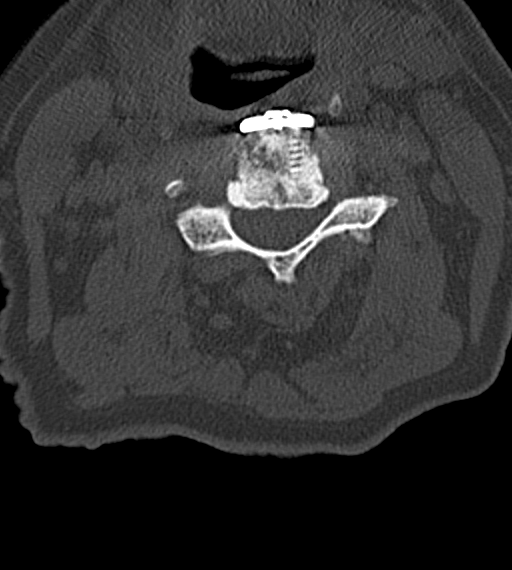
[im 93/112  bone]
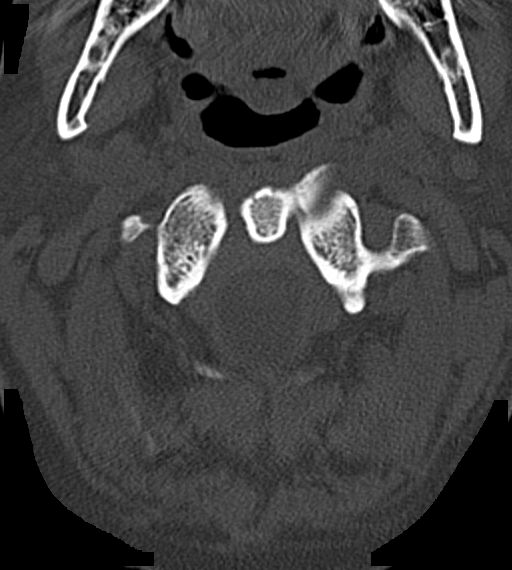

[12 of 33 positions shown; findings below may reference images not displayed]

FINDINGS: CT HEAD FINDINGS

Brain: No evidence of parenchymal hemorrhage or extra-axial fluid
collection. No mass lesion, mass effect, or midline shift. No CT
evidence of acute infarction. Stable encephalomalacia in the right
frontal and left parietal lobes. Stable mild prominence of the CSF
space in posterior aspect of the posterior fossa compatible with
small recommend cyst. Nonspecific moderate subcortical and
periventricular white matter hypodensity, most in keeping with
chronic small vessel ischemic change. No acute ventriculomegaly.

Vascular: No acute abnormality.

Skull: No evidence of calvarial fracture.

Sinuses/Orbits: No fluid levels. Mucoperiosteal thickening in
bilateral ethmoidal air cells and left maxillary sinus.

Other:  The mastoid air cells are unopacified.

CT CERVICAL SPINE FINDINGS

Alignment: Normal cervical lordosis. No facet subluxation. Dens is
well positioned between the lateral masses of C1.

Skull base and vertebrae: No acute fracture. No primary bone lesion
or focal pathologic process.

Soft tissues and spinal canal: No prevertebral edema. No visible
canal hematoma.

Disc levels: Status post ACDF C3-C6, with no evidence hardware
fracture or loosening. There is bony fusion across the C3-4, C4-5
and C5-6 disc spaces. Mild degenerative disc disease in the
remaining cervical disc levels. Mild bilateral facet arthropathy.
Mild degenerative foraminal stenosis bilaterally at C3-4. Moderate
degenerate foraminal stenosis on the left at C4-5 and on the right
at C5-6.

Upper chest: No acute abnormality.

Other: Visualized mastoid air cells appear clear. Heterogeneous
thyroid gland without discrete thyroid nodules. No pathologically
enlarged cervical nodes.
IMPRESSION: CT HEAD:

1. No evidence of acute intracranial abnormality. Evidence of
calvarial fracture.
2. Stable right frontal and left parietal encephalomalacia.
3. Moderate chronic small vessel ischemic changes in the cerebral
white matter.
4. Mild paranasal sinusitis, chronic appearing.

CT CERVICAL SPINE:

1. No cervical spine fracture or subluxation.
2. ACDF C3-C6, with no evidence of hardware complication.
3. Mild-to-moderate multilevel degenerative changes in the cervical
spine as detailed.

## 2018-04-11 IMAGING — CR DG CHEST 2V
1 series · 2 of 2 positions shown · non-contrast
Comparison: Chest x-ray of July 28, 2010

CLINICAL DATA: Witnessed fall yesterday. Now with confusion. Former
smoker. History of coronary artery disease and stent placement.

EXAM:
CHEST  2 VIEW

[Series 1: dg chest 2 view · 0.14mm/px · 2 of 2 slices shown]
[im 1/2]
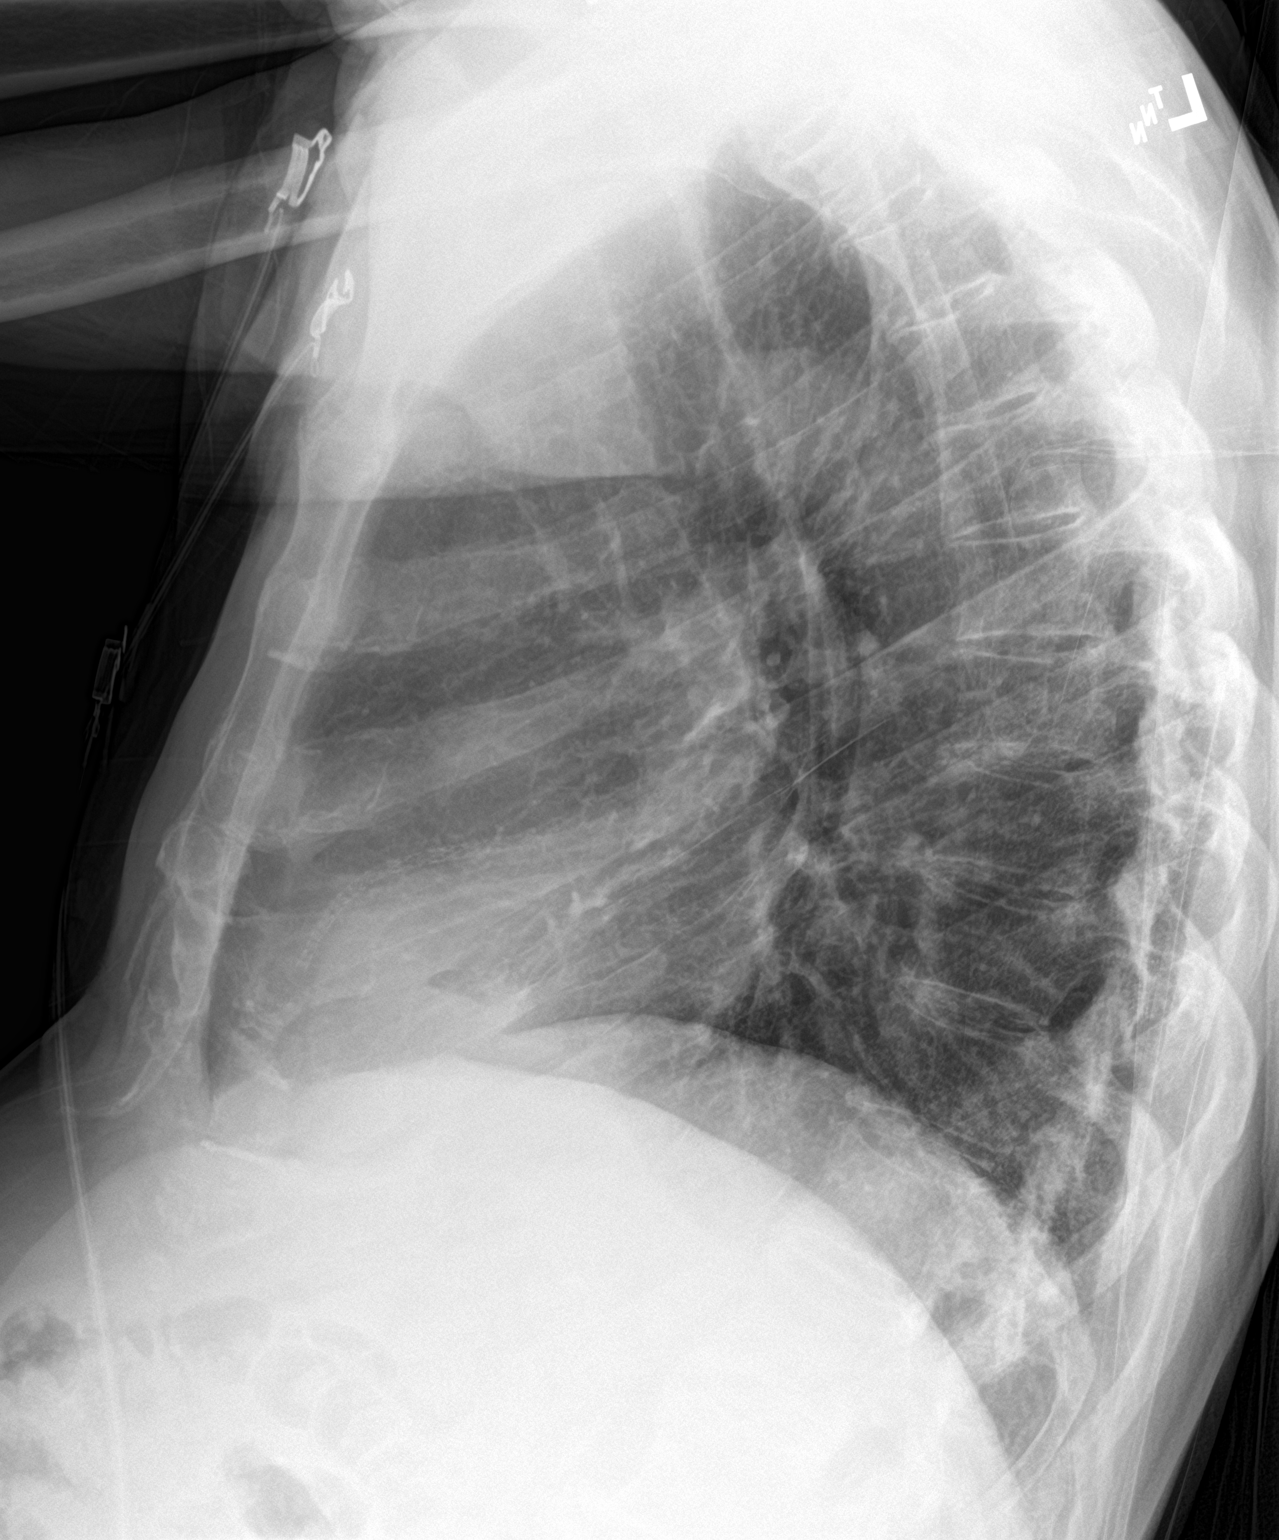
[im 2/2]
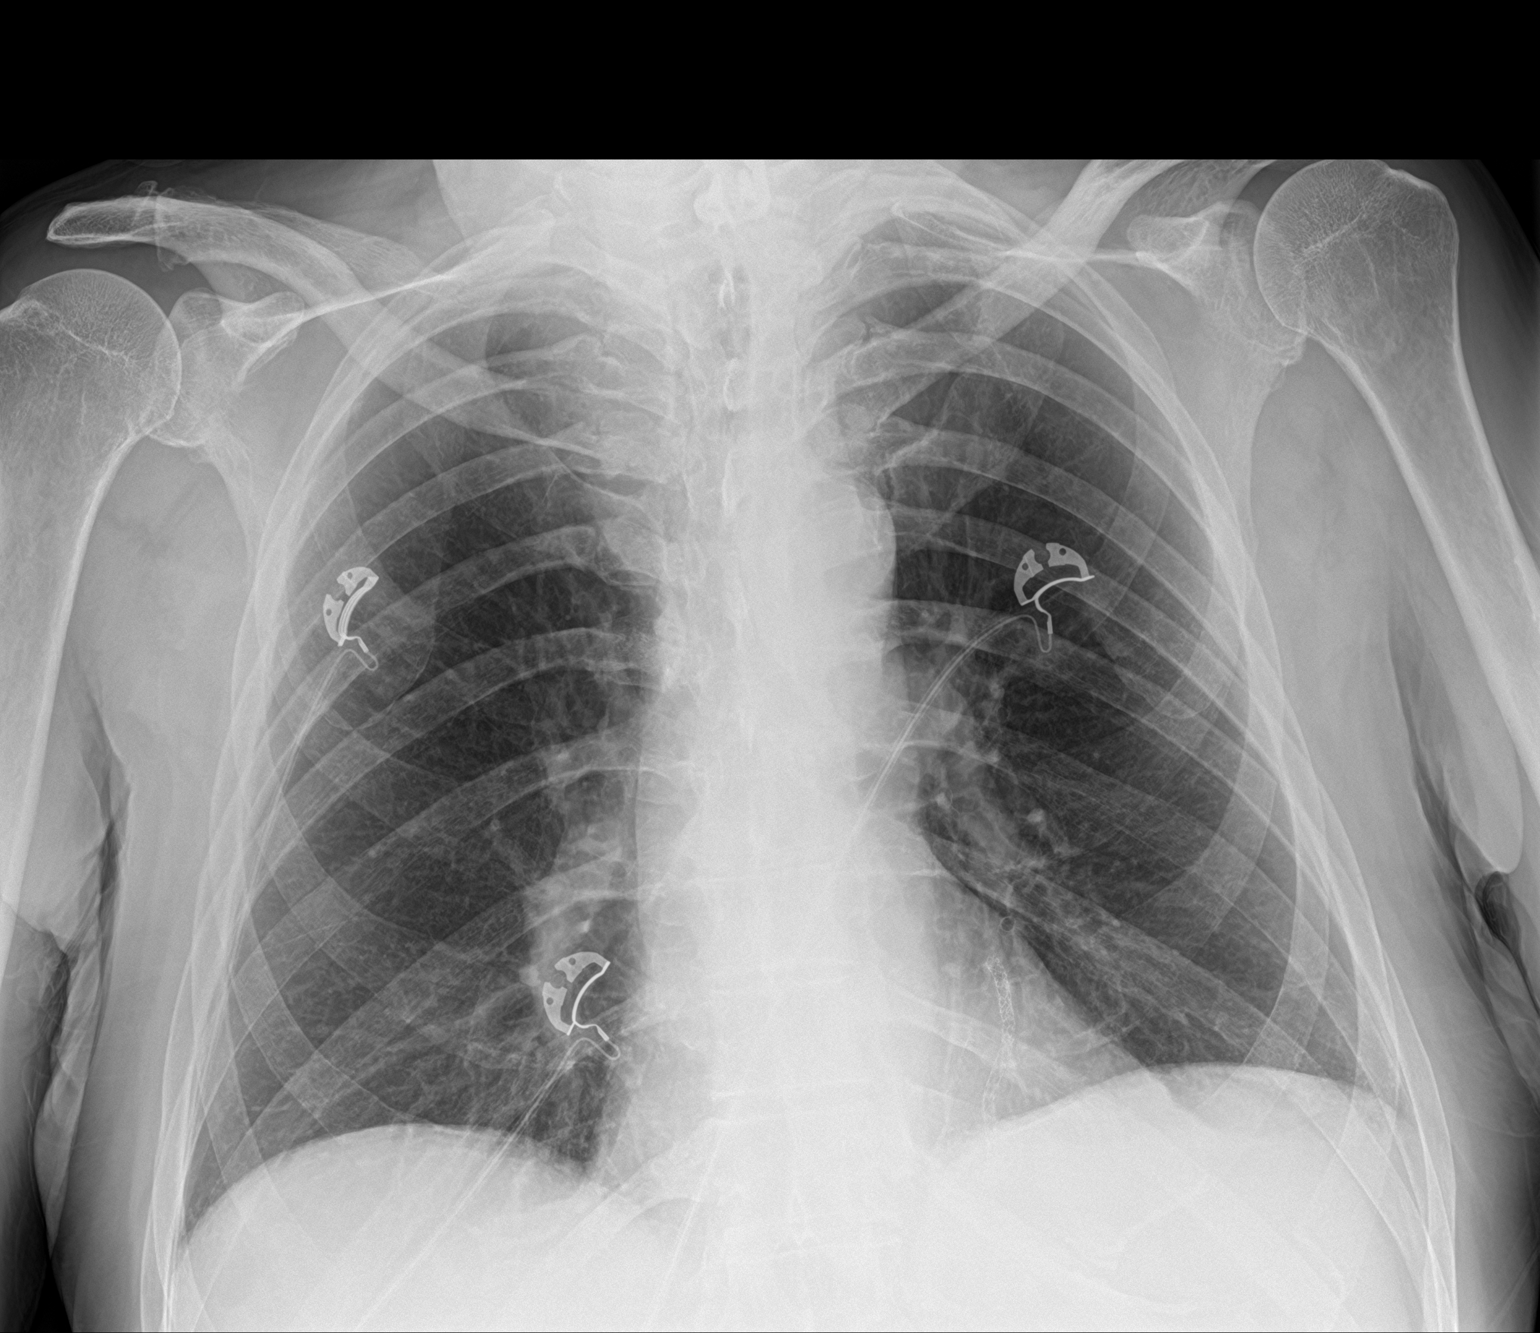

[2 of 2 positions shown; findings below may reference images not displayed]

FINDINGS: The lungs are well-expanded. There is no focal infiltrate. There is
no pleural effusion or pneumothorax. The heart and pulmonary
vascularity are normal. A coronary artery stent is visible. The
mediastinum is normal in width. The bony thorax exhibits no acute
abnormality.
IMPRESSION: There is no active cardiopulmonary disease. A coronary artery stent
is present.

## 2018-04-11 IMAGING — CR DG LUMBAR SPINE COMPLETE 4+V
1 series · 5 of 5 positions shown · non-contrast
Comparison: None in PACs

CLINICAL DATA: Unwitnessed fall yesterday.  Low back pain.

EXAM:
LUMBAR SPINE - COMPLETE 4+ VIEW

[Series 1: dg lumbar spine complete 4 +v · 0.14mm/px · 5 of 5 slices shown]
[im 1/5]
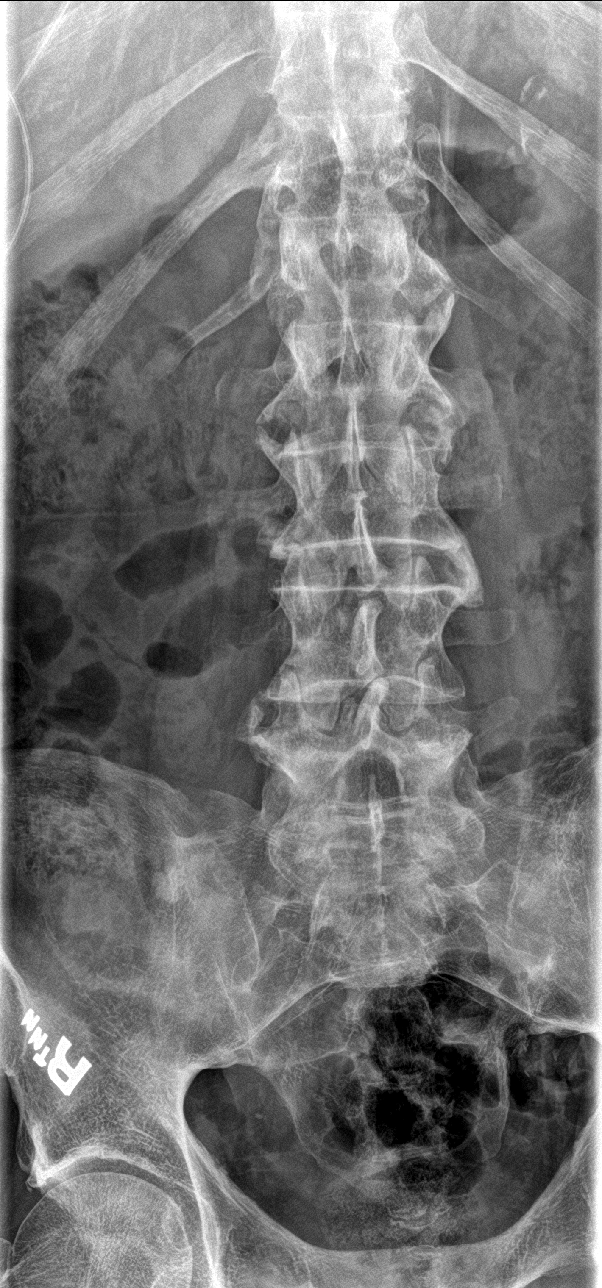
[im 2/5]
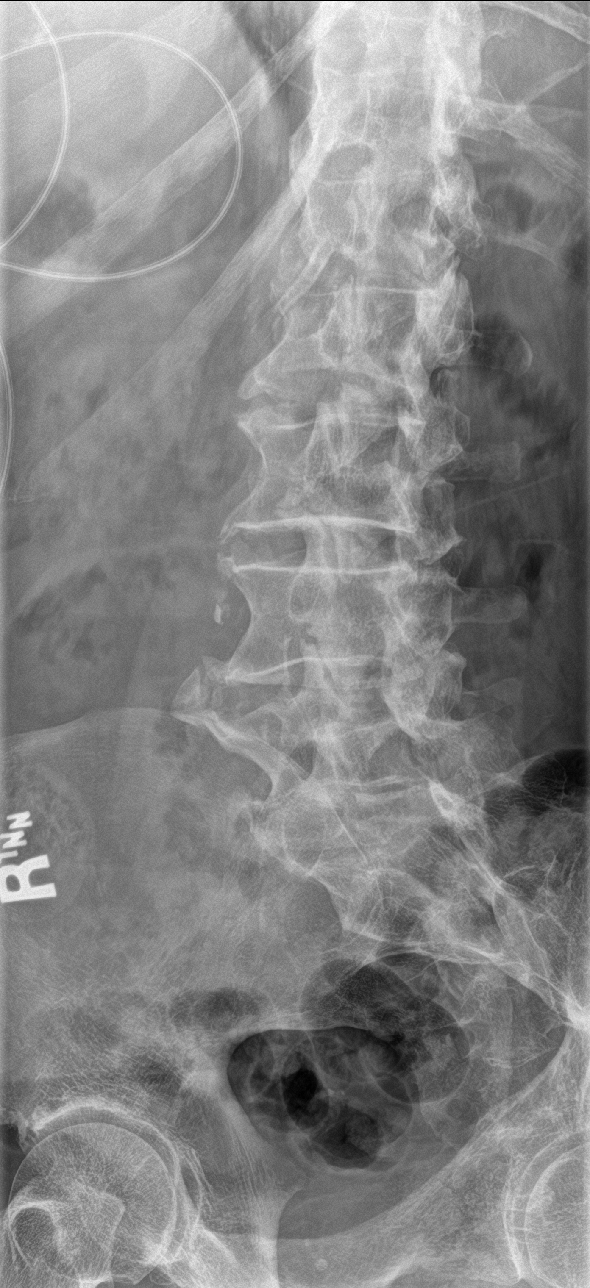
[im 3/5]
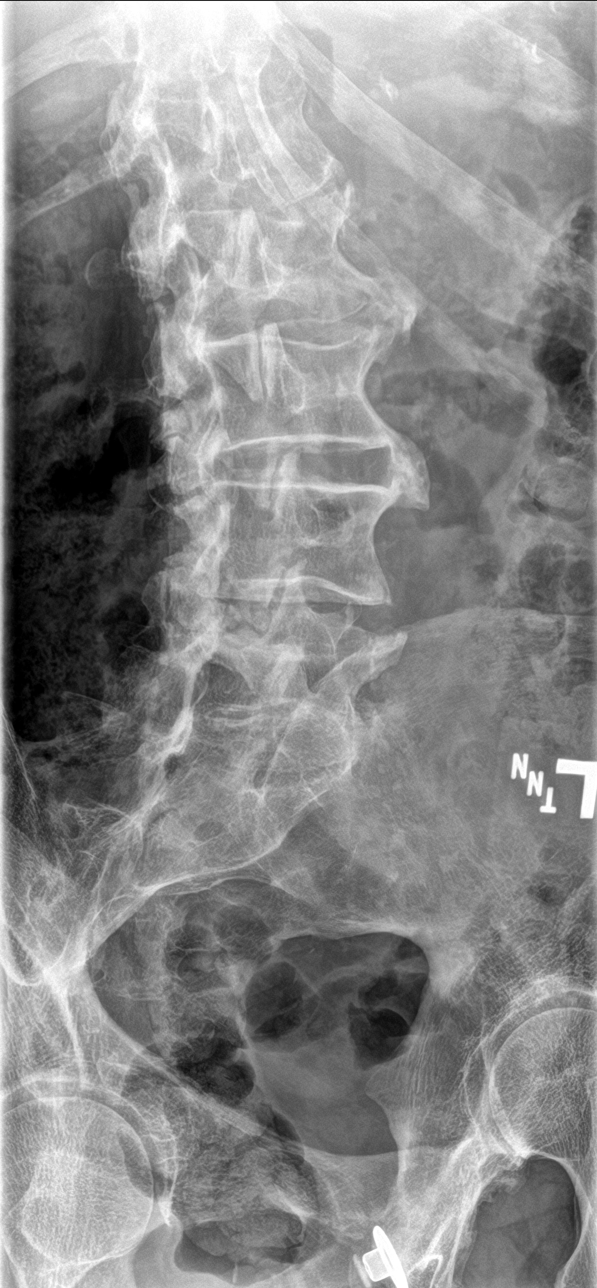
[im 4/5]
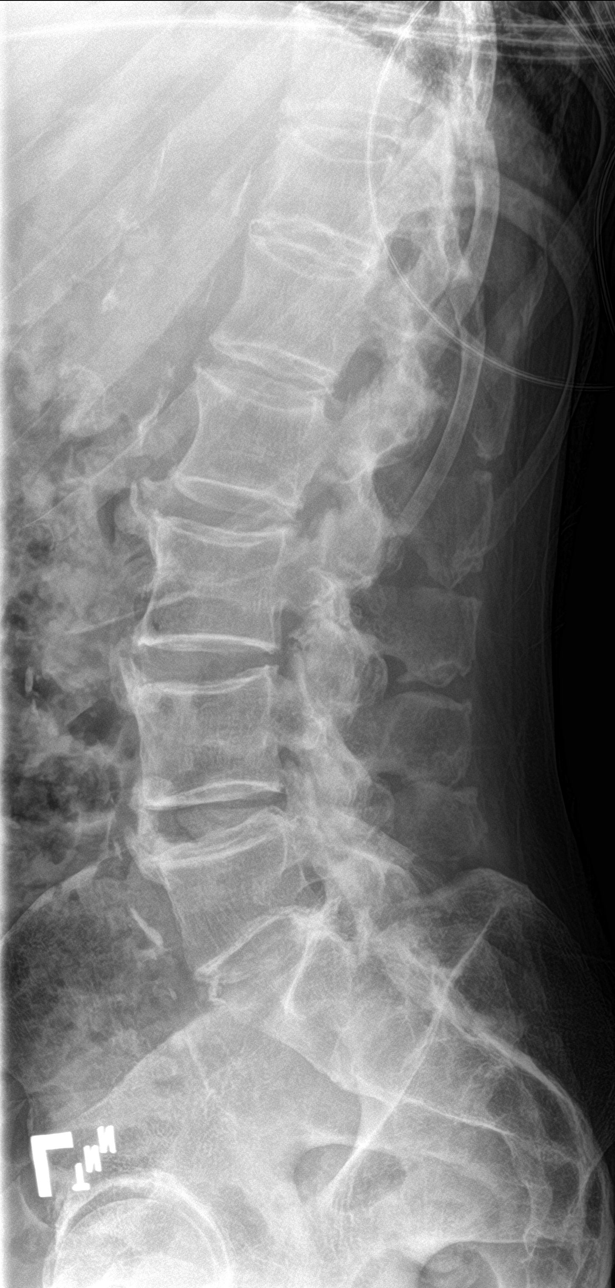
[im 5/5]
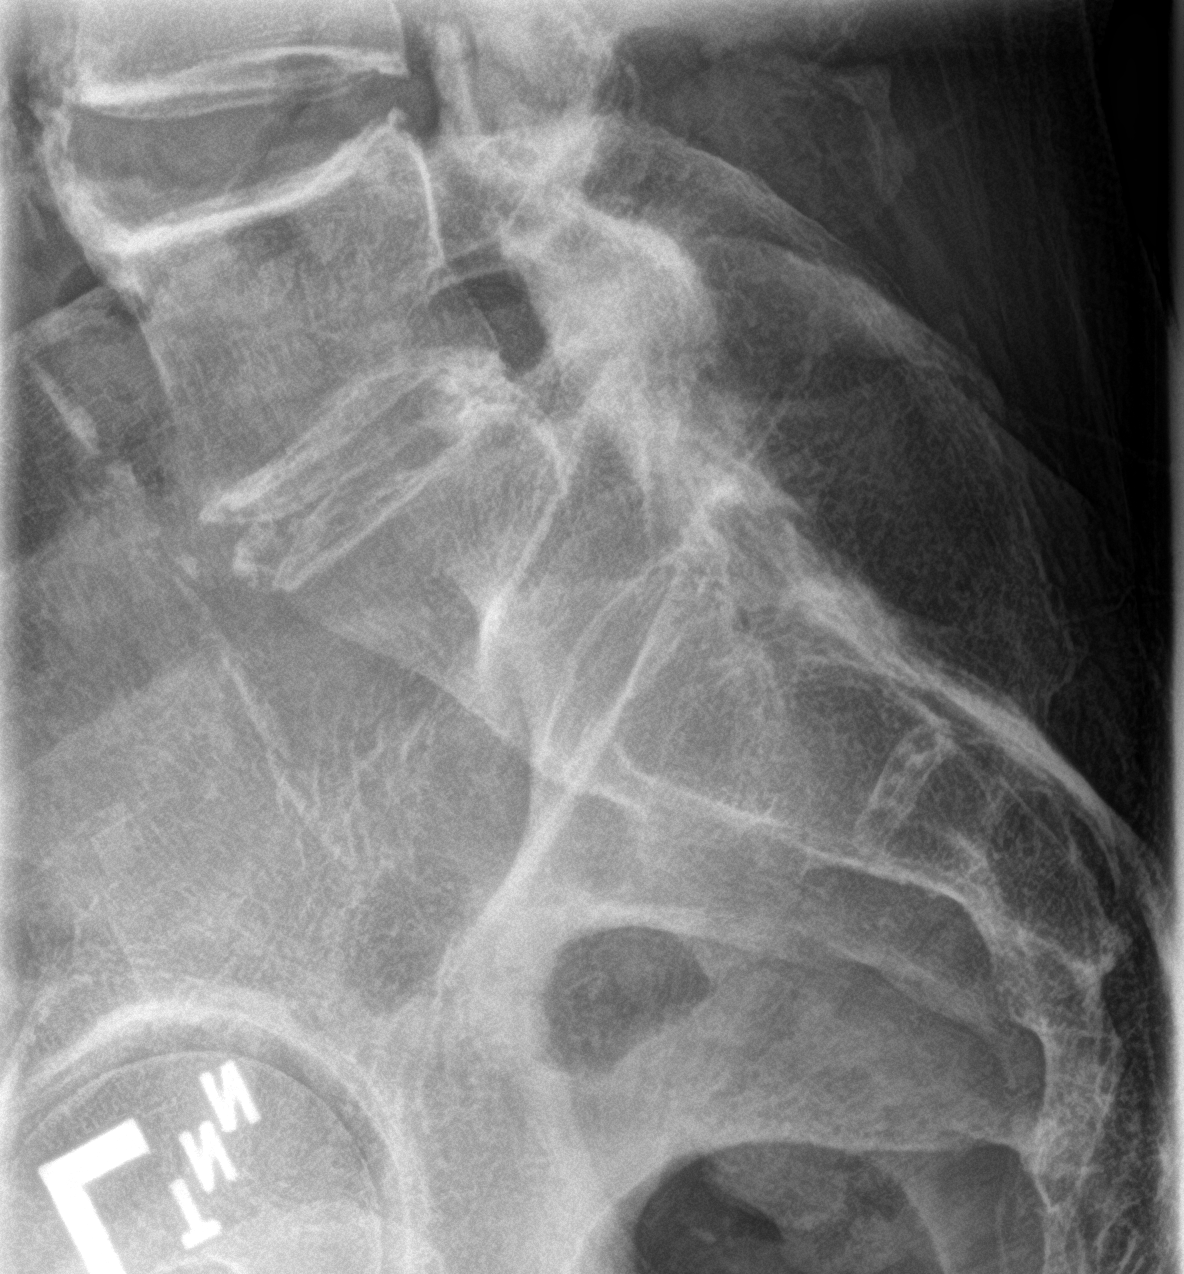

[5 of 5 positions shown; findings below may reference images not displayed]

FINDINGS: The lumbar vertebral bodies are preserved in height. There is
anterior endplate spurring at L2-3, L3-4, and L4-5. The disc space
heights are well maintained. There is mild facet joint hypertrophy
from L3 through S1. There is no spondylolisthesis. The pedicles and
transverse processes are intact. The transverse processes of L1 are
transitional.
IMPRESSION: There is mild multilevel degenerative endplate spurring and facet
joint hypertrophy. There is no compression fracture,
spondylolisthesis, nor high-grade disc space narrowing.

## 2018-07-22 ENCOUNTER — Telehealth: Payer: Self-pay

## 2018-07-22 MED ORDER — LISINOPRIL 10 MG PO TABS: 20.0000 mg | ORAL_TABLET | Freq: Every day | ORAL | 0 refills | Status: DC

## 2018-07-22 NOTE — Telephone Encounter (Signed)
Received fax from [facility].  Patients Lisinopril 20MG  is currently on backorder.  Pharmacy is requesting [place] change the prescription to Lisinopril 10MG  BID

## 2018-07-22 NOTE — Telephone Encounter (Signed)
Refilled Lisinopril (2) 10mg  daily due to 20mg  being back ordered. #180 R-0

## 2018-07-31 DIAGNOSIS — E119 Type 2 diabetes mellitus without complications: Secondary | ICD-10-CM | POA: Diagnosis not present

## 2018-08-07 DIAGNOSIS — Z8673 Personal history of transient ischemic attack (TIA), and cerebral infarction without residual deficits: Secondary | ICD-10-CM | POA: Diagnosis not present

## 2018-08-07 DIAGNOSIS — Z0000 Encounter for general adult medical examination without abnormal findings: Secondary | ICD-10-CM | POA: Diagnosis not present

## 2018-08-07 DIAGNOSIS — E119 Type 2 diabetes mellitus without complications: Secondary | ICD-10-CM | POA: Diagnosis not present

## 2018-08-07 DIAGNOSIS — F329 Major depressive disorder, single episode, unspecified: Secondary | ICD-10-CM | POA: Diagnosis not present

## 2018-08-07 DIAGNOSIS — Z0001 Encounter for general adult medical examination with abnormal findings: Secondary | ICD-10-CM | POA: Diagnosis not present

## 2018-08-07 DIAGNOSIS — I259 Chronic ischemic heart disease, unspecified: Secondary | ICD-10-CM | POA: Diagnosis not present

## 2018-08-07 DIAGNOSIS — I10 Essential (primary) hypertension: Secondary | ICD-10-CM | POA: Diagnosis not present

## 2018-10-17 ENCOUNTER — Telehealth: Payer: Self-pay

## 2018-10-17 MED ORDER — AMLODIPINE BESYLATE 10 MG PO TABS: 10.0000 mg | ORAL_TABLET | Freq: Every day | ORAL | 0 refills | Status: DC

## 2018-10-17 MED ORDER — CLOPIDOGREL BISULFATE 75 MG PO TABS: 75.0000 mg | ORAL_TABLET | Freq: Every day | ORAL | 0 refills | Status: DC

## 2018-10-17 NOTE — Telephone Encounter (Signed)
Requested Prescriptions   Signed Prescriptions Disp Refills   clopidogrel (PLAVIX) 75 MG tablet 90 tablet 0    Sig: Take 1 tablet (75 mg total) by mouth daily with breakfast.    Authorizing Provider: GOLLAN, TIMOTHY J    Ordering User: NEWCOMER MCCLAIN, Willman Cuny L    

## 2018-10-17 NOTE — Telephone Encounter (Signed)
Requested Prescriptions   Signed Prescriptions Disp Refills  . amLODipine (NORVASC) 10 MG tablet 30 tablet 0    Sig: Take 1 tablet (10 mg total) by mouth daily.    Authorizing Provider: Minna Merritts    Ordering User: Raelene Bott, Antonetta Clanton L

## 2018-11-11 ENCOUNTER — Other Ambulatory Visit: Payer: Self-pay | Admitting: Cardiovascular Disease

## 2018-11-19 NOTE — Progress Notes (Signed)
Cardiology Office Note    Date:  11/22/2018   ID:  [name], DOB April 28, 1940, MRN [id number]  PCP:  [name], MD  Cardiologist:  Ida Rogue, MD  Electrophysiologist:  None   Chief Complaint: Follow up  History of Present Illness:   Raymond Sampson is a 78 y.o. male with history of CAD with apical MI in 08/2005 s/p stenting x 3 to the LAD, TIA/CVA in 07/2010, atrial tachycardia, bilateral carotid artery disease as detailed below, dementia, DM2, HTN, HLD, and prior tobacco abuse for 30 years quitting > 25 years prior who presents for follow up of his CAD, carotid artery disease, and atrial tachycardia.   Patient was admitted to the hospital in 08/2005 with an MI that was treated with PCI/DES x 3 to the LAD. Stress test in 2013 showed no significant ischemia, EF 68%, with severely elevated BP. Echo in 2014 showed an EF of 48-18%, diastolic dysfunction, normal RVSF and cavity size, normal RVSP. More recently, he was seen in early 2018 with palpitations and abnormal EKG leading to a Myoview in 04/2016 that was low risk. Outpatient cardiac monitoring in 05/2016 showed NSR with an average heart rate of 69 bpm (40-171 bpm), 7 atrial tachycardic runs occurred with the longest lasting 7 beats with an average rate of 105 bpm with no evidence of Afib. Most recent carotid artery ultrasound from 05/2016 showed improved 1-39% RICA stenosis and stable 5-63% LICA stenosis, > 14% left ECA stenosis, antegrade flow of the bilateral vertebral arteries, normal subclavian artery flow bilaterally. He was most recently seen in the office in 11/2017 and was doing reasonably well. He was living a relatively sedentary lifestyle with some dietary noncompliance.   Patient comes in accompanied by his wife today and is doing extremely well.  He denies any chest pain, shortness of breath, palpitations, dizziness, presyncope, or syncope.  No lower extremity swelling, abdominal tension, orthopnea, PND, early satiety.   No falls, BRBPR, or melena.  He does not regularly check his blood pressure at home though when he saw PCP recently his blood pressure was noted to be in the 970Y systolic.  He reports compliance with all medications.  He does not have any issues or concerns today.   Labs: 07/2018 - K+ 4.6, SCr 1.1, A1c 7.2,  01/2018 - AST/ALT normal, albumin 4.5, WBC 10.0, HGB 13.3, PLT 259, TC 143, TG 189, HDL 38, LDL 67 02/2016 - TSH normal   Past Medical History:  Diagnosis Date   Arthritic-like pain    CAD (coronary artery disease)    a. apical MI 5/07 with LHC showing 50% pCFX, 30% pRCA, 99% pLAD, 99% mLAD, 75% dLAD (cypher DES x3 to LAD); b. ETT-myoview (12/08) 81% MPHR,  EF 64%, partially reversible inferoapical perfusion defect similar to prior study 12/07; c. 04/2016 MV: EF 59%, fixed anteroapical defect, no ischemia->Low risk.   Carotid arterial disease ([place])    a. 05/2016 Carotid U/S: 30-40% bilat dzs, f/u in 2 yrs.   CVA (cerebral vascular accident) ([place])    a. 07/2010 - h/o CVA/TIA.   Diet-controlled type 2 diabetes mellitus (HCC)    HLD (hyperlipidemia)    HTN (hypertension)    PAT (paroxysmal atrial tachycardia) ([place])    a. 06/2016 Event monitor:  rare episodes of atrial tachycardia, longest 7 beats. No afib.    Past Surgical History:  Procedure Laterality Date   BACK SURGERY     CARDIAC CATHETERIZATION     CORONARY ANGIOPLASTY  WITH STENT PLACEMENT     apical MI 5/07 with LHC showing 50% pCFX, 30% pRCA, 99% pLAD, 9%% mLAD, 75% dLAD. cypher DES x3 to LAD. ETT-myoview (12/08) 81% MPHR, 8'1, EF 64%, partially reversible inferoapical perfusion defect similar to prior study 12/07    Current Medications: Current Meds  Medication Sig   amLODipine (NORVASC) 10 MG tablet TAKE 1 TABLET BY MOUTH EVERY DAY   aspirin 81 MG tablet Take 81 mg by mouth daily.    clopidogrel (PLAVIX) 75 MG tablet Take 1 tablet (75 mg total) by mouth daily with breakfast.   cyanocobalamin 1000 MCG  tablet Take 1,000 mcg by mouth daily.   gabapentin (NEURONTIN) 100 MG capsule Take 100 mg by mouth at bedtime.   lisinopril (PRINIVIL,ZESTRIL) 20 MG tablet Take 20 mg by mouth daily.   metFORMIN (GLUCOPHAGE) 1000 MG tablet Take 1,000 mg by mouth 2 (two) times daily with a meal.   metoprolol tartrate (LOPRESSOR) 25 MG tablet TAKE 1 TABLET BY MOUTH TWICE A DAY   sertraline (ZOLOFT) 25 MG tablet Take 1 tablet (25 mg total) by mouth daily.   simvastatin (ZOCOR) 20 MG tablet TAKE 1 TABLET BY MOUTH EVERY DAY     Allergies:   Simvastatin   Social History   Socioeconomic History   Marital status: Married    Spouse name: Not on file   Number of children: Not on file   Years of education: Not on file   Highest education level: Not on file  Occupational History   Occupation: retired  Scientist, product/process development strain: Not on file   Food insecurity    Worry: Not on file    Inability: Not on Lexicographer needs    Medical: Not on file    Non-medical: Not on file  Tobacco Use   Smoking status: Former Smoker    Packs/day: 1.00    Years: 25.00    Pack years: 25.00    Types: Cigarettes    Quit date: 04/17/1985    Years since quitting: 33.6   Smokeless tobacco: Never Used  Substance and Sexual Activity   Alcohol use: No   Drug use: No   Sexual activity: Not on file  Lifestyle   Physical activity    Days per week: Not on file    Minutes per session: Not on file   Stress: Not on file  Relationships   Social connections    Talks on phone: Not on file    Gets together: Not on file    Attends religious service: Not on file    Active member of club or organization: Not on file    Attends meetings of clubs or organizations: Not on file    Relationship status: Not on file  Other Topics Concern   Not on file  Social History Narrative   Part time work at [employer]. Does not regularly exercise.      Family History:  The patient's Family  history is unknown by patient.  ROS:   Review of Systems  Constitutional: Negative for chills, diaphoresis, fever, malaise/fatigue and weight loss.  HENT: Negative for congestion.   Eyes: Negative for discharge and redness.  Respiratory: Negative for cough, hemoptysis, sputum production, shortness of breath and wheezing.   Cardiovascular: Negative for chest pain, palpitations, orthopnea, claudication, leg swelling and PND.  Gastrointestinal: Negative for abdominal pain, blood in stool, heartburn, melena, nausea and vomiting.  Genitourinary: Negative for hematuria.  Musculoskeletal: Negative for  falls and myalgias.  Skin: Negative for rash.  Neurological: Negative for dizziness, tingling, tremors, sensory change, speech change, focal weakness, loss of consciousness and weakness.  Endo/Heme/Allergies: Does not bruise/bleed easily.  Psychiatric/Behavioral: Negative for substance abuse. The patient is not nervous/anxious.   All other systems reviewed and are negative.    EKGs/Labs/Other Studies Reviewed:    Studies reviewed were summarized above. The additional studies were reviewed today:  [facility] 05/2016: Event Monitor Normal sinus rhythm Min HR of 40 bpm, max HR of 171 bpm, and avg HR of 69 bpm.   7 Atrial tachycardia runs occurred, the longest lasting 7 beats with an avg rate of 105 bpm.  Isolated SVEs were rare (<1.0%), SVE Couplets were rare (<1.0%), and SVE Triplets were rare (<1.0%).  Isolated VEs were rare (<1.0%), VE Couplets were rare (<1.0%), and no VE Triplets were present. __________  Myoview 04/2016: Pharmacological myocardial perfusion imaging study with no significant  Ischemia Small region of predominantly fixed defect in the anteroapical wall of mild severity Normal wall motion (GI uptake artifact noted),  EF estimated at 59% No EKG changes concerning for ischemia at peak stress or in recovery. Severe hypertension noted, SBP 180 Low risk scan   EKG:  EKG is  ordered today.  The EKG ordered today demonstrates sinus bradycardia, 56 bpm, nonspecific anterolateral ST-T changes  Recent Labs: No results found for requested labs within last 8760 hours.  Recent Lipid Panel    Component Value Date/Time   CHOL 138 12/28/2012 0506   TRIG 197 12/28/2012 0506   TRIG 111 08/06/2008   HDL 34 (L) 12/28/2012 0506   CHOLHDL 3.2 Ratio 05/24/2009 2033   VLDL 39 12/28/2012 0506   LDLCALC 65 12/28/2012 0506    PHYSICAL EXAM:    VS:  BP 140/72 (BP Location: Left Arm, Patient Position: Sitting, Cuff Size: Normal)    Pulse (!) 56    Ht 5' 7.5" (1.715 m)    Wt 157 lb 4 oz (71.3 kg)    BMI 24.27 kg/m   BMI: Body mass index is 24.27 kg/m.  Physical Exam  Constitutional: He is oriented to person, place, and time. He appears well-developed and well-nourished.  HENT:  Head: Normocephalic and atraumatic.  Eyes: Right eye exhibits no discharge. Left eye exhibits no discharge.  Neck: Normal range of motion. No JVD present.  Cardiovascular: Normal rate, regular rhythm, S1 normal, S2 normal and normal heart sounds. Exam reveals no distant heart sounds, no friction rub, no midsystolic click and no opening snap.  No murmur heard. Pulses:      Posterior tibial pulses are 2+ on the right side and 2+ on the left side.  Pulmonary/Chest: Effort normal and breath sounds normal. No respiratory distress. He has no decreased breath sounds. He has no wheezes. He has no rales. He exhibits no tenderness.  Abdominal: Soft. He exhibits no distension. There is no abdominal tenderness.  Musculoskeletal:        General: No edema.  Neurological: He is alert and oriented to person, place, and time.  Skin: Skin is warm and dry. No cyanosis. Nails show no clubbing.  Psychiatric: He has a normal mood and affect. His speech is normal and behavior is normal. Judgment and thought content normal.    Wt Readings from Last 3 Encounters:  11/22/18 157 lb 4 oz (71.3 kg)  11/22/17 157 lb (71.2  kg)  06/05/17 150 lb 6.4 oz (68.2 kg)     ASSESSMENT & PLAN:  1. CAD involving the native coronary arteries without angina: He is doing well without any symptoms concerning for angina.  Continue secondary prevention with aspirin, Plavix, Lopressor, and simvastatin.  No plans for further ischemic evaluation at this time.  2. Atrial tachycardia: Asymptomatic.  Prior outpatient cardiac monitoring as above.  Continue current dose of Lopressor.  3. Carotid artery disease: Asymptomatic.  Most recent carotid artery ultrasound from 05/2016 showed improved left internal carotid artery stenosis to 1 to 39% with a stable right internal carotid artery stenosis of 1 to 39% along with bilateral antegrade flow of the vertebral arteries and normal flow hemodynamics of the subclavian arteries.  Schedule carotid artery ultrasound.  4. Hypertension: Blood pressure is mildly elevated today though was recently well controlled at PCPs office.  Patient and wife prefer no changes at this time.  In this setting, he will continue current doses of amlodipine, lisinopril, and metoprolol tartrate.  5. Hyperlipidemia: LDL of 67 from 01/2018.  Remains on simvastatin.  Followed by PCP.  He is due for follow-up labs in 01/2019.  6. TIA/CVA: Remains on dual antiplatelet therapy.  Per PCP/neurology.  Disposition: F/u with Dr. Rockey Situ or an APP in 6 to 12 months, sooner if needed.   Medication Adjustments/Labs and Tests Ordered: Current medicines are reviewed at length with the patient today.  Concerns regarding medicines are outlined above. Medication changes, Labs and Tests ordered today are summarized above and listed in the Patient Instructions accessible in Encounters.   Signed, Christell Faith, PA-C 11/22/2018 11:53 AM     [facility] [address] [place] [address] [place], [place] [address] [phone]

## 2018-11-22 ENCOUNTER — Encounter: Payer: Self-pay | Admitting: Physician Assistant

## 2018-11-22 ENCOUNTER — Other Ambulatory Visit: Payer: Self-pay

## 2018-11-22 ENCOUNTER — Ambulatory Visit (INDEPENDENT_AMBULATORY_CARE_PROVIDER_SITE_OTHER): Payer: PPO | Admitting: Physician Assistant

## 2018-11-22 VITALS — BP 140/72 | HR 56 | Ht 67.5 in | Wt 157.2 lb

## 2018-11-22 DIAGNOSIS — I2510 Atherosclerotic heart disease of native coronary artery without angina pectoris: Secondary | ICD-10-CM

## 2018-11-22 DIAGNOSIS — I6523 Occlusion and stenosis of bilateral carotid arteries: Secondary | ICD-10-CM

## 2018-11-22 DIAGNOSIS — I10 Essential (primary) hypertension: Secondary | ICD-10-CM

## 2018-11-22 DIAGNOSIS — I471 Supraventricular tachycardia: Secondary | ICD-10-CM | POA: Diagnosis not present

## 2018-11-22 DIAGNOSIS — Z8673 Personal history of transient ischemic attack (TIA), and cerebral infarction without residual deficits: Secondary | ICD-10-CM

## 2018-11-22 DIAGNOSIS — E785 Hyperlipidemia, unspecified: Secondary | ICD-10-CM

## 2018-11-22 NOTE — Patient Instructions (Signed)
Medication Instructions:  Your physician recommends that you continue on your current medications as directed. Please refer to the Current Medication list given to you today.  If you need a refill on your cardiac medications before your next appointment, please call your pharmacy.   Lab work: None ordered  If you have labs (blood work) drawn today and your tests are completely normal, you will receive your results only by: Marland Kitchen MyChart Message (if you have MyChart) OR . A paper copy in the mail If you have any lab test that is abnormal or we need to change your treatment, we will call you to review the results.  Testing/Procedures: 1-Carotid Artery ultrasound  Your physician has requested that you have a carotid duplex. This test is an ultrasound of the carotid arteries in your neck. It looks at blood flow through these arteries that supply the brain with blood. Allow one hour for this exam. There are no restrictions or special instructions.    Follow-Up: At [facility], you and your health needs are our priority.  As part of our continuing mission to provide you with exceptional heart care, we have created designated Provider Care Teams.  These Care Teams include your primary Cardiologist (physician) and Advanced Practice Providers (APPs -  Physician Assistants and Nurse Practitioners) who all work together to provide you with the care you need, when you need it. You will need a follow up appointment in 6-12 months. You may see Ida Rogue, MD or Christell Faith, PA-C.

## 2018-12-05 ENCOUNTER — Other Ambulatory Visit: Payer: Self-pay | Admitting: Cardiovascular Disease

## 2018-12-06 ENCOUNTER — Other Ambulatory Visit: Payer: Self-pay

## 2018-12-06 ENCOUNTER — Ambulatory Visit (INDEPENDENT_AMBULATORY_CARE_PROVIDER_SITE_OTHER): Payer: PPO

## 2018-12-06 DIAGNOSIS — I6523 Occlusion and stenosis of bilateral carotid arteries: Secondary | ICD-10-CM

## 2018-12-10 ENCOUNTER — Telehealth: Payer: Self-pay

## 2018-12-10 NOTE — Telephone Encounter (Signed)
Patient made aware of carotid results with verbal understanding.

## 2018-12-10 NOTE — Telephone Encounter (Signed)
-----   Message from Rise Mu, PA-C sent at 12/06/2018  4:38 PM EDT ----- Carotid artery ultrasound showed minimal, 1 to 39% bilateral internal carotid artery stenosis with normal flow of the vertebral arteries and subclavian arteries bilaterally.  Reassuring study.

## 2019-01-11 ENCOUNTER — Other Ambulatory Visit: Payer: Self-pay | Admitting: Cardiovascular Disease

## 2019-01-14 ENCOUNTER — Other Ambulatory Visit: Payer: Self-pay

## 2019-01-14 MED ORDER — LISINOPRIL 20 MG PO TABS: 20.0000 mg | ORAL_TABLET | Freq: Every day | ORAL | 3 refills | Status: DC

## 2019-01-14 NOTE — Telephone Encounter (Signed)
Refill sent for Lisinopril 20 mg take one tablet daily.

## 2019-01-31 DIAGNOSIS — E119 Type 2 diabetes mellitus without complications: Secondary | ICD-10-CM | POA: Diagnosis not present

## 2019-02-07 DIAGNOSIS — E119 Type 2 diabetes mellitus without complications: Secondary | ICD-10-CM | POA: Diagnosis not present

## 2019-02-07 DIAGNOSIS — Z8673 Personal history of transient ischemic attack (TIA), and cerebral infarction without residual deficits: Secondary | ICD-10-CM | POA: Diagnosis not present

## 2019-02-07 DIAGNOSIS — G250 Essential tremor: Secondary | ICD-10-CM | POA: Diagnosis not present

## 2019-02-07 DIAGNOSIS — F329 Major depressive disorder, single episode, unspecified: Secondary | ICD-10-CM | POA: Diagnosis not present

## 2019-02-07 DIAGNOSIS — Z23 Encounter for immunization: Secondary | ICD-10-CM | POA: Diagnosis not present

## 2019-02-07 DIAGNOSIS — I10 Essential (primary) hypertension: Secondary | ICD-10-CM | POA: Diagnosis not present

## 2019-02-24 DIAGNOSIS — E113391 Type 2 diabetes mellitus with moderate nonproliferative diabetic retinopathy without macular edema, right eye: Secondary | ICD-10-CM | POA: Diagnosis not present

## 2019-02-24 DIAGNOSIS — H43822 Vitreomacular adhesion, left eye: Secondary | ICD-10-CM | POA: Diagnosis not present

## 2019-02-24 DIAGNOSIS — E113292 Type 2 diabetes mellitus with mild nonproliferative diabetic retinopathy without macular edema, left eye: Secondary | ICD-10-CM | POA: Diagnosis not present

## 2019-02-24 DIAGNOSIS — H35371 Puckering of macula, right eye: Secondary | ICD-10-CM | POA: Diagnosis not present

## 2019-03-06 ENCOUNTER — Other Ambulatory Visit: Payer: Self-pay | Admitting: Cardiovascular Disease

## 2019-03-06 MED ORDER — AMLODIPINE BESYLATE 10 MG PO TABS: 10.0000 mg | ORAL_TABLET | Freq: Every day | ORAL | 2 refills | Status: DC

## 2019-03-06 NOTE — Telephone Encounter (Signed)
Requested Prescriptions   Signed Prescriptions Disp Refills  . amLODipine (NORVASC) 10 MG tablet 90 tablet 2    Sig: Take 1 tablet (10 mg total) by mouth daily.    Authorizing Provider: Minna Merritts    Ordering User: Britt Bottom

## 2019-03-06 NOTE — Telephone Encounter (Signed)
°*  STAT* If patient is at the pharmacy, call can be transferred to refill team.   1. Which medications need to be refilled? (please list name of each medication and dose if known) amlodipine 10 MG 1 tablet daily   2. Which pharmacy/location (including street and city if local pharmacy) is medication to be sent to? [facility] [place]   3. Do they need a 30 day or 90 day supply? 90 day    

## 2019-03-17 ENCOUNTER — Other Ambulatory Visit: Payer: Self-pay

## 2019-03-17 MED ORDER — SIMVASTATIN 20 MG PO TABS: 20.0000 mg | ORAL_TABLET | Freq: Every day | ORAL | 0 refills | Status: DC

## 2019-03-17 NOTE — Telephone Encounter (Signed)
*  STAT* If patient is at the pharmacy, call can be transferred to refill team.   1. Which medications need to be refilled? (please list name of each medication and dose if known) Simvastatin  2. Which pharmacy/location (including street and city if local pharmacy) is medication to be sent to? [facility] [place]  3. Do they need a 30 day or 90 day supply? [place]

## 2019-04-22 DIAGNOSIS — H40003 Preglaucoma, unspecified, bilateral: Secondary | ICD-10-CM | POA: Diagnosis not present

## 2019-05-28 NOTE — Progress Notes (Signed)
Office Visit    Patient Name: Raymond Sampson Date of Encounter: 05/29/2019  Primary Care Provider:  [name], MD Primary Cardiologist:  Ida Rogue, MD  Chief Complaint    79 yo male with history of CAD with apical MI 08/2005 s/p stenting x3 to the LAD, TIA/CVA 07/2010, atrial tachycardia, bilateral carotid artery dz, dementia, DM2, HTN, HLD, and prior tobacco use for 30 years quitting >25 years prior, and who presents for 6 month follow-up.  Past Medical History    Past Medical History:  Diagnosis Date  . Arthritic-like pain   . CAD (coronary artery disease)    a. apical MI 5/07 with LHC showing 50% pCFX, 30% pRCA, 99% pLAD, 99% mLAD, 75% dLAD (cypher DES x3 to LAD); b. ETT-myoview (12/08) 81% MPHR,  EF 64%, partially reversible inferoapical perfusion defect similar to prior study 12/07; c. 04/2016 MV: EF 59%, fixed anteroapical defect, no ischemia->Low risk.  . Carotid arterial disease ([place])    a. 05/2016 Carotid U/S: 30-40% bilat dzs, f/u in 2 yrs.  . CVA (cerebral vascular accident) ([place])    a. 07/2010 - h/o CVA/TIA.  . Diet-controlled type 2 diabetes mellitus ([place])   . HLD (hyperlipidemia)   . HTN (hypertension)   . PAT (paroxysmal atrial tachycardia) ([place])    a. 06/2016 Event monitor:  rare episodes of atrial tachycardia, longest 7 beats. No afib.   Past Surgical History:  Procedure Laterality Date  . BACK SURGERY    . CARDIAC CATHETERIZATION    . CORONARY ANGIOPLASTY WITH STENT PLACEMENT     apical MI 5/07 with LHC showing 50% pCFX, 30% pRCA, 99% pLAD, 9%% mLAD, 75% dLAD. cypher DES x3 to LAD. ETT-myoview (12/08) 81% MPHR, 8'1, EF 64%, partially reversible inferoapical perfusion defect similar to prior study 12/07    Allergies  Allergies  Allergen Reactions  . Simvastatin Rash    rash    History of Present Illness    79 yo male with PMH as above and presenting for 6 month follow-up of CAD, carotid artery dz, and atrial tachycardia. He suffered an MI  08/2005 that was treated with PCI/DES x3 to LAD. 2013 MPI low risk though with severely elevated BP. 2014 TTE showed EF 60-65%, DD. He was seen in 2018 for palpitations and abnormal EKG with subsequent 04/2016 MPI ruled low risk. Cardiac monitoring 05/2016 showed NSR with 7 runs of AT (longest 7 beats). No evidence of Afib. Updated carotid artery ultrasound 12/2018 showed right and left ICA 1-39% stenosis. He was last seen in the office 11/22/2018 and was doing well from a cardiac standpoint. He was not regularly checking his BP at home; however, it was noted his BP was [id number] systolic when he last saw his PCP though clinic BP was noted to be mildly elevated. Per documentation, patient and wife preferred to defer any antihypertensive changes at that time.    Since that time, he reports doing well.  He reports some left-sided "twinge-like" CP that lasted only seconds s/p his first coronavirus vaccine. This pain occurred s/p his vaccine in his right arm.  It was not tender to palpation.  It was not pleuritic. No associated symptoms were reported. No recent racing HR or palpitations. No recent falls or, presyncopal, or syncopal episodes. No abdominal distention, orthopnea, increased LEE, SOB/DOE, or early satiety. He does note occasional and even recent melena. No BRBPR, hematuria, or hematochezia. He states he does not drink water often, because he does not like  it very much (also reports trouble swallowing the water but does not struggle to drink other liquids or food). He reports intermittent neuropathy like symptoms in bed but not during the day. He denies a regular exercise routine and continues to live a sedentary lifestyle. He reports some dietary compliance. He does not regularly check his BP or weights at home.  Wt 162lbs (5 lbs up, 157 at previous clinic visit) though euvolemic on exam.   Home Medications    Prior to Admission medications   Medication Sig Start Date End Date Taking? Authorizing Provider   amLODipine (NORVASC) 10 MG tablet Take 1 tablet (10 mg total) by mouth daily. 03/06/19   Minna Merritts, MD  aspirin 81 MG tablet Take 81 mg by mouth daily.     [provider]  clopidogrel (PLAVIX) 75 MG tablet TAKE 1 TABLET (75 MG TOTAL) BY MOUTH DAILY WITH BREAKFAST. 01/13/19   Minna Merritts, MD  cyanocobalamin 1000 MCG tablet Take 1,000 mcg by mouth daily.    [provider]  gabapentin (NEURONTIN) 100 MG capsule Take 100 mg by mouth at bedtime.    [provider]  lisinopril (ZESTRIL) 20 MG tablet Take 1 tablet (20 mg total) by mouth daily. 01/14/19   Minna Merritts, MD  metFORMIN (GLUCOPHAGE) 1000 MG tablet Take 1,000 mg by mouth 2 (two) times daily with a meal.    [provider]  metoprolol tartrate (LOPRESSOR) 25 MG tablet TAKE 1 TABLET BY MOUTH TWICE A DAY 12/05/18   Minna Merritts, MD  sertraline (ZOLOFT) 25 MG tablet Take 1 tablet (25 mg total) by mouth daily. 04/26/16   Minna Merritts, MD  simvastatin (ZOCOR) 20 MG tablet Take 1 tablet (20 mg total) by mouth daily. 03/17/19   Minna Merritts, MD    Review of Systems    He denies palpitations, dyspnea, pnd, orthopnea, n, v, dizziness, syncope, edema, weight gain, or early satiety. He reports atypical and brief twinges of CP after his vaccine (1st dose) and melena.   All other systems reviewed and are otherwise negative except as noted above.  Physical Exam    VS:  BP 126/72 (BP Location: Left Arm, Patient Position: Sitting, Cuff Size: Normal)   Pulse 61   Ht 5\' 7"  (1.702 m)   Wt 162 lb 4 oz (73.6 kg)   SpO2 96%   BMI 25.41 kg/m  , BMI Body mass index is 25.41 kg/m. GEN: Well nourished, well developed, in no acute distress. HEENT: normal. Neck: Supple, no JVD, carotid bruits, or masses. Cardiac: RRR, no murmurs, rubs, or gallops. No clubbing, cyanosis, edema.  Radials/DP/PT 2+ and equal bilaterally.  Respiratory:  Respirations regular and unlabored, clear to auscultation  bilaterally. GI: Soft, nontender, nondistended, BS + x 4. MS: no deformity or atrophy. Skin: warm and dry, no rash. Neuro:  Strength and sensation are intact. Psych: Normal affect.  Accessory Clinical Findings    ECG personally reviewed by me today -NSR, 61bpm, nonspecific T wave abnormality and repolarization changes- no acute changes.  VITALS Reviewed   Temp Readings from Last 3 Encounters:  06/07/17 98 F (36.7 C)   BP Readings from Last 3 Encounters:  05/29/19 126/72  11/22/18 140/72  11/22/17 (!) 142/62   Pulse Readings from Last 3 Encounters:  05/29/19 61  11/22/18 (!) 56  11/22/17 64    Wt Readings from Last 3 Encounters:  05/29/19 162 lb 4 oz (73.6 kg)  11/22/18 157 lb 4  oz (71.3 kg)  11/22/17 157 lb (71.2 kg)     LABS  reviewed   [facility] Labs present Yes/No: Yes   Labs:  01/31/2019 total cholesterol 152, TG 155, HDL 36.0, LDL 85, Na 139, K 4.1, Cr 1.2, BUN 21, Ca 9.5, AST/ALT 15, A1C 7.4 2017:TSH 0.894   Lab Results  Component Value Date   WBC 7.7 06/06/2017   HGB 12.7 (L) 06/06/2017   HCT 38.4 (L) 06/06/2017   MCV 90.9 06/06/2017   PLT 225 06/06/2017   Lab Results  Component Value Date   CREATININE 1.13 06/06/2017   BUN 26 (H) 06/06/2017   NA 135 06/06/2017   K 3.7 06/06/2017   CL 102 06/06/2017   CO2 22 06/06/2017   Lab Results  Component Value Date   ALT 20 06/05/2017   AST 52 (H) 06/05/2017   ALKPHOS 58 06/05/2017   BILITOT 1.1 06/05/2017   Lab Results  Component Value Date   CHOL 138 12/28/2012   HDL 34 (L) 12/28/2012   LDLCALC 65 12/28/2012   TRIG 197 12/28/2012   CHOLHDL 3.2 Ratio 05/24/2009    No results found for: HGBA1C Lab Results  Component Value Date   TSH 1.032 05/24/2009     STUDIES/PROCEDURES reviewed    12/06/2018 Carotid US Summary:  Right Carotid: Velocities in the right ICA are consistent with a 1-39%  stenosis. Non-hemodynamically significant plaque <50% noted in the  CCA. The  ECA appears <50%  stenosed.  Left Carotid: Velocities in the left ICA are consistent with a 1-39%  stenosis. Non-hemodynamically significant plaque <50% noted in the  CCA. TheECA appears >50% stenosed. Vertebrals: Bilateral vertebral arteries demonstrate antegrade flow.  Subclavians: Normal flow hemodynamics were seen in bilateral subclavian arteries.   05/02/2016 Myoview Pharmacological myocardial perfusion imaging study with no significant  Ischemia Small region of predominantly fixed defect in the anteroapical wall of mild severity Normal wall motion (GI uptake artifact noted),  EF estimated at 59% No EKG changes concerning for ischemia at peak stress or in recovery. Severe hypertension noted, SBP 180 Low risk scan Signed, Esmond Plants, MD, Ph.D [facility]  05/12/2016 Monitor Event Monitor Normal sinus rhythm Min HR of 40 bpm, max HR of 171 bpm, and avg HR of 69 bpm.  7 Atrial tachycardia runs occurred, the longest lasting 7 beats with an avg rate of 105 bpm. Isolated SVEs were rare (<1.0%), SVE Couplets were rare (<1.0%), and SVE Triplets were rare (<1.0%).  Isolated VEs were rare (<1.0%), VE Couplets were rare (<1.0%), and no VE Triplets were present. Signed, Esmond Plants, MD, Ph.D [facility]    Assessment & Plan    CAD involving the native coronary arteries without angina  --No recent symptoms concerning for angina.  Euvolemic on exam. 2018 Myoview ruled a low risk study.  Continue DAPT with ASA and Plavix.  Given melena, will repeat CBC. Continue Lopressor and simvastatin with recommendations below.  No plans for further ischemic evaluation at this time. Encouraged increased activity and reviewed dietary recommendations. Risk factor modification recommended.  Atrial tachycardia  --Denies any racing heart rate, palpitations, presyncope, or syncope.  2018 cardiac monitor as above shows atrial tachycardia.  No signs of Afib. Continue to monitor. Will repeat TSH given last check was 2017.  Continue current Lopressor.  Carotid artery disease  --Updated study as above and 1-39% b/l ICA stenosis with nl flow of vertebral and subclavian arteries bilaterally. Recommend lipid and BP control, as well as glycemic control. Continue  to monitor.  HTN  --BP well controlled today. Encouraged monitoring BP at home. Lifestyle changes discussed. Continue amlodipine, lisinopril, and metoprolol tartrate.  HLD  --Continue current statin therapy.  Most recent 01/2019 LDL above goal at 85 with recommendation for increased statin dose.  Per wife, patient experienced myalgias on increased dose of simvastatin.  Discussed adding Zetia with their preference to defer and attempt natural alternatives at this time. Reassess at RTC with addition of Zetia or referral to lipid clinic at that time.  DM2, poorly controlled  --A1C 7.4 on 01/2019. Tight glycemic control recommended for risk factor modification. Reports bilateral LE neuropathy on gabapentin with current plan to bring this up with his PCP.  TIA/CVA --Remains on DAPT.  Ordered CBC for melena.  Per PCP/neurology.   Medication changes:None- deferred changes to statin, addition of Zetia. Labs ordered: CBC, TSH  Studies / Imaging ordered: None.  Disposition: RTC 6 months  Total time spent with patient today 45 minutes. This includes reviewing records, evaluating the patient, and coordinating care. Face-to-face time >50%.   Arvil Chaco, PA-C 05/29/2019, 5:48 PM

## 2019-05-29 ENCOUNTER — Other Ambulatory Visit: Payer: Self-pay

## 2019-05-29 ENCOUNTER — Encounter: Payer: Self-pay | Admitting: Physician Assistant

## 2019-05-29 ENCOUNTER — Ambulatory Visit (INDEPENDENT_AMBULATORY_CARE_PROVIDER_SITE_OTHER): Payer: PPO | Admitting: Physician Assistant

## 2019-05-29 VITALS — BP 126/72 | HR 61 | Ht 67.0 in | Wt 162.2 lb

## 2019-05-29 DIAGNOSIS — E1165 Type 2 diabetes mellitus with hyperglycemia: Secondary | ICD-10-CM

## 2019-05-29 DIAGNOSIS — Z8673 Personal history of transient ischemic attack (TIA), and cerebral infarction without residual deficits: Secondary | ICD-10-CM | POA: Diagnosis not present

## 2019-05-29 DIAGNOSIS — I10 Essential (primary) hypertension: Secondary | ICD-10-CM

## 2019-05-29 DIAGNOSIS — Z955 Presence of coronary angioplasty implant and graft: Secondary | ICD-10-CM

## 2019-05-29 DIAGNOSIS — I2510 Atherosclerotic heart disease of native coronary artery without angina pectoris: Secondary | ICD-10-CM

## 2019-05-29 DIAGNOSIS — Z8679 Personal history of other diseases of the circulatory system: Secondary | ICD-10-CM | POA: Diagnosis not present

## 2019-05-29 DIAGNOSIS — E785 Hyperlipidemia, unspecified: Secondary | ICD-10-CM | POA: Diagnosis not present

## 2019-05-29 DIAGNOSIS — I6523 Occlusion and stenosis of bilateral carotid arteries: Secondary | ICD-10-CM | POA: Diagnosis not present

## 2019-05-29 DIAGNOSIS — K921 Melena: Secondary | ICD-10-CM | POA: Diagnosis not present

## 2019-05-29 NOTE — Patient Instructions (Signed)
Medication Instructions:  Your physician recommends that you continue on your current medications as directed. Please refer to the Current Medication list given to you today.  *If you need a refill on your cardiac medications before your next appointment, please call your pharmacy*  Lab Work: Your physician recommends that you have lab work today(CBC, TSH)  If you have labs (blood work) drawn today and your tests are completely normal, you will receive your results only by: Marland Kitchen MyChart Message (if you have MyChart) OR . A paper copy in the mail If you have any lab test that is abnormal or we need to change your treatment, we will call you to review the results.  Testing/Procedures: None ordered   Follow-Up: At [facility], you and your health needs are our priority.  As part of our continuing mission to provide you with exceptional heart care, we have created designated Provider Care Teams.  These Care Teams include your primary Cardiologist (physician) and Advanced Practice Providers (APPs -  Physician Assistants and Nurse Practitioners) who all work together to provide you with the care you need, when you need it.  Your next appointment:   6 month(s)  The format for your next appointment:   In Person  Provider:    You may see Ida Rogue, MD or Marrianne Mood, PA-C.

## 2019-05-30 LAB — LIPID PANEL
Chol/HDL Ratio: 3.9 ratio (ref 0.0–5.0)
Cholesterol, Total: 132 mg/dL (ref 100–199)
HDL: 34 mg/dL — ABNORMAL LOW (ref >39)
LDL Chol Calc (NIH): 64 mg/dL (ref 0–99)
Triglycerides: 204 mg/dL — ABNORMAL HIGH (ref 0–149)
VLDL Cholesterol Cal: 34 mg/dL (ref 5–40)

## 2019-05-30 LAB — CBC
Hematocrit: 38.9 % (ref 37.5–51.0)
Hemoglobin: 13.4 g/dL (ref 13.0–17.7)
MCH: 30.6 pg (ref 26.6–33.0)
MCHC: 34.4 g/dL (ref 31.5–35.7)
MCV: 89 fL (ref 79–97)
Platelets: 259 10*3/uL (ref 150–450)
RBC: 4.38 x10E6/uL (ref 4.14–5.80)
RDW: 12.4 % (ref 11.6–15.4)
WBC: 8.1 10*3/uL (ref 3.4–10.8)

## 2019-05-30 LAB — TSH: TSH: 0.781 u[IU]/mL (ref 0.450–4.500)

## 2019-06-08 ENCOUNTER — Other Ambulatory Visit: Payer: Self-pay | Admitting: Cardiovascular Disease

## 2019-07-21 DIAGNOSIS — H35371 Puckering of macula, right eye: Secondary | ICD-10-CM | POA: Diagnosis not present

## 2019-07-21 DIAGNOSIS — E113293 Type 2 diabetes mellitus with mild nonproliferative diabetic retinopathy without macular edema, bilateral: Secondary | ICD-10-CM | POA: Diagnosis not present

## 2019-07-21 DIAGNOSIS — H40003 Preglaucoma, unspecified, bilateral: Secondary | ICD-10-CM | POA: Diagnosis not present

## 2019-08-05 DIAGNOSIS — E119 Type 2 diabetes mellitus without complications: Secondary | ICD-10-CM | POA: Diagnosis not present

## 2019-08-12 DIAGNOSIS — E119 Type 2 diabetes mellitus without complications: Secondary | ICD-10-CM | POA: Diagnosis not present

## 2019-08-12 DIAGNOSIS — I10 Essential (primary) hypertension: Secondary | ICD-10-CM | POA: Diagnosis not present

## 2019-08-12 DIAGNOSIS — G250 Essential tremor: Secondary | ICD-10-CM | POA: Diagnosis not present

## 2019-08-12 DIAGNOSIS — F329 Major depressive disorder, single episode, unspecified: Secondary | ICD-10-CM | POA: Diagnosis not present

## 2019-08-12 DIAGNOSIS — I259 Chronic ischemic heart disease, unspecified: Secondary | ICD-10-CM | POA: Diagnosis not present

## 2019-08-12 DIAGNOSIS — Z0001 Encounter for general adult medical examination with abnormal findings: Secondary | ICD-10-CM | POA: Diagnosis not present

## 2019-08-12 DIAGNOSIS — Z8673 Personal history of transient ischemic attack (TIA), and cerebral infarction without residual deficits: Secondary | ICD-10-CM | POA: Diagnosis not present

## 2019-08-12 DIAGNOSIS — Z0000 Encounter for general adult medical examination without abnormal findings: Secondary | ICD-10-CM | POA: Diagnosis not present

## 2019-08-31 ENCOUNTER — Other Ambulatory Visit: Payer: Self-pay | Admitting: Cardiovascular Disease

## 2019-10-02 ENCOUNTER — Other Ambulatory Visit: Payer: Self-pay | Admitting: Cardiovascular Disease

## 2019-11-24 NOTE — Progress Notes (Signed)
Cardiology Office Note  Date:  11/25/2019   ID:  Raymond Sampson, DOB 1940/04/19, MRN [id number]  PCP:  [name], MD   Chief Complaint  Patient presents with   other    6 month follow up. Meds reviewed by the pt. verbally. "doing well."     HPI:  79 yo male with history of  CAD  apical MI in 5/07 with 3 stents placed in the LAD.   h/o CVA and TIA  07/2010, bilateral carotid disease, right side, 50%, 60% on the left, 05/2016 Prior history of smoking for 30 years, stopped smoking 25 years ago Dementia Who presents for routine follow-up of his coronary artery disease  Seen by one of our providers 05/2019 No acute findings at that time   underlying dementia Though symptoms have been stable per the wife  For a while was not feeling well, He quit amlodipine on his own, Also quit gabapentin "felt better" BP elevated today Does not check his blood pressure at home  With PMD: 130/80 off amlodipine  Had a fall,  Sedentary at baseline with some leg weakness  followed by neurology, [name]  stroke history 2012, etiology unclear.   Lab work discussed with him Poor diet HAB1C 7.4 Total chol 151 LDL 83  Event monitor  rare episodes of atrial tachycardia, longest was 7 beats Rare ectopy  EKG personally reviewed by myself on todays visit Shows normal sinus rhythm rate 62 bpm, T-wave changes V3 to V4  Other past medical history stress test 12/08  showed a partially reversible inferoapical defect similar to prior stress in 12/07.  Echo in 3/11 showed preserved EF.   PMH:   has a past medical history of Arthritic-like pain, CAD (coronary artery disease), Carotid arterial disease ([place]), CVA (cerebral vascular accident) ([place]), Diet-controlled type 2 diabetes mellitus ([place]), HLD (hyperlipidemia), HTN (hypertension), and PAT (paroxysmal atrial tachycardia) ([place]).  PSH:    Past Surgical History:  Procedure Laterality Date   BACK SURGERY     CARDIAC CATHETERIZATION      CORONARY ANGIOPLASTY WITH STENT PLACEMENT     apical MI 5/07 with LHC showing 50% pCFX, 30% pRCA, 99% pLAD, 9%% mLAD, 75% dLAD. cypher DES x3 to LAD. ETT-myoview (12/08) 81% MPHR, 8'1, EF 64%, partially reversible inferoapical perfusion defect similar to prior study 12/07    Current Outpatient Medications  Medication Sig Dispense Refill   aspirin 81 MG tablet Take 81 mg by mouth daily.      clopidogrel (PLAVIX) 75 MG tablet TAKE 1 TABLET (75 MG TOTAL) BY MOUTH DAILY WITH BREAKFAST. 90 tablet 0   cyanocobalamin 1000 MCG tablet Take 1,000 mcg by mouth daily.     lisinopril (ZESTRIL) 20 MG tablet Take 1 tablet (20 mg total) by mouth daily. 90 tablet 3   metFORMIN (GLUCOPHAGE) 1000 MG tablet Take 1,000 mg by mouth 2 (two) times daily with a meal.     metoprolol tartrate (LOPRESSOR) 25 MG tablet TAKE 1 TABLET BY MOUTH TWICE A DAY 180 tablet 3   sertraline (ZOLOFT) 25 MG tablet Take 1 tablet (25 mg total) by mouth daily.     simvastatin (ZOCOR) 20 MG tablet TAKE 1 TABLET BY MOUTH EVERY DAY 90 tablet 0   No current facility-administered medications for this visit.    Allergies:   Simvastatin   Social History:  The patient  reports that he quit smoking about 34 years ago. His smoking use included cigarettes. He has a 25.00 pack-year smoking  history. He has never used smokeless tobacco. He reports that he does not drink alcohol and does not use drugs.   Family History:   Family history is unknown by patient.    Review of Systems: Review of Systems  Constitutional: Negative.   Respiratory: Negative.   Cardiovascular: Negative.   Gastrointestinal: Negative.   Musculoskeletal: Negative.   Neurological: Negative.   Psychiatric/Behavioral: Negative.   All other systems reviewed and are negative.    PHYSICAL EXAM: VS:  BP (!) 158/60 (BP Location: Left Arm, Patient Position: Sitting, Cuff Size: Normal)    Pulse 62    Ht 5\' 7"  (1.702 m)    Wt 156 lb 2 oz (70.8 kg)    SpO2 98%    BMI  24.45 kg/m  , BMI Body mass index is 24.45 kg/m. Constitutional:  oriented to person, place, and time. No distress.  HENT:  Head: Grossly normal Eyes:  no discharge. No scleral icterus.  Neck: No JVD, no carotid bruits  Cardiovascular: Regular rate and rhythm, no murmurs appreciated Pulmonary/Chest: Clear to auscultation bilaterally, no wheezes or rails Abdominal: Soft.  no distension.  no tenderness.  Musculoskeletal: Normal range of motion Neurological:  normal muscle tone. Coordination normal. No atrophy Skin: Skin warm and dry Psychiatric: normal affect, pleasant   Recent Labs: 05/29/2019: Hemoglobin 13.4; Platelets 259; TSH 0.781    Lipid Panel Lab Results  Component Value Date   CHOL 132 05/29/2019   HDL 34 (L) 05/29/2019   LDLCALC 64 05/29/2019   TRIG 204 (H) 05/29/2019      Wt Readings from Last 3 Encounters:  11/25/19 156 lb 2 oz (70.8 kg)  05/29/19 162 lb 4 oz (73.6 kg)  11/22/18 157 lb 4 oz (71.3 kg)      ASSESSMENT AND PLAN:  Atherosclerosis of native coronary artery of native heart without angina pectoris -  Currently with no symptoms of angina. No further workup at this time. Continue current medication regimen. Low risk scan January 2018 No ischemia  Atrial flutter, unspecified type ([place]) -  Maintaining normal sinus rhythm Prior event monitor Shows rare episodes of atrial tachycardia, longest was 7 beats  no further work-up at this time  Stenosis of carotid artery, unspecified laterality - Stable disease mild to moderate Stressed importance of aggressive diabetes control LDL slightly above goal, recommend compliance with his simvastatin  Palpitations - Rare episodes of atrial tachycardia seen on previous event monitor Overall stable  Essential hypertension Recently held his amlodipine as he did not feel well, recommend he monitor blood pressure at home and if it runs high we may need to restart amlodipine 5 mg daily He held the amlodipine  for nonspecific issues possibly related to his gabapentin    Total encounter time more than 25 minutes  Greater than 50% was spent in counseling and coordination of care with the patient   Disposition:   F/U  12 months   No orders of the defined types were placed in this encounter.    Signed, Esmond Plants, M.D., Ph.D. 11/25/2019  [place], [place]

## 2019-11-25 ENCOUNTER — Other Ambulatory Visit: Payer: Self-pay | Admitting: Cardiovascular Disease

## 2019-11-25 ENCOUNTER — Other Ambulatory Visit: Payer: Self-pay

## 2019-11-25 ENCOUNTER — Encounter: Payer: Self-pay | Admitting: Cardiovascular Disease

## 2019-11-25 ENCOUNTER — Ambulatory Visit: Payer: PPO | Admitting: Cardiovascular Disease

## 2019-11-25 VITALS — BP 158/60 | HR 62 | Ht 67.0 in | Wt 156.1 lb

## 2019-11-25 DIAGNOSIS — I6523 Occlusion and stenosis of bilateral carotid arteries: Secondary | ICD-10-CM | POA: Diagnosis not present

## 2019-11-25 DIAGNOSIS — E785 Hyperlipidemia, unspecified: Secondary | ICD-10-CM

## 2019-11-25 DIAGNOSIS — I739 Peripheral vascular disease, unspecified: Secondary | ICD-10-CM | POA: Diagnosis not present

## 2019-11-25 DIAGNOSIS — I471 Supraventricular tachycardia: Secondary | ICD-10-CM

## 2019-11-25 DIAGNOSIS — E1165 Type 2 diabetes mellitus with hyperglycemia: Secondary | ICD-10-CM

## 2019-11-25 DIAGNOSIS — Z8673 Personal history of transient ischemic attack (TIA), and cerebral infarction without residual deficits: Secondary | ICD-10-CM | POA: Diagnosis not present

## 2019-11-25 DIAGNOSIS — I25118 Atherosclerotic heart disease of native coronary artery with other forms of angina pectoris: Secondary | ICD-10-CM | POA: Diagnosis not present

## 2019-11-25 NOTE — Patient Instructions (Addendum)
Check BP at home, Call if >140s all the time  Make sure to check Blood Pressures 2 hours after your medications.   Please keep a log of your readings so we can see how they trend.  Avoid these things for 30 minutes before checking your blood pressure:  Drinking caffeine.  Drinking alcohol.  Eating.  Smoking.  Exercising.  Five minutes before checking your blood pressure:  Pee.  Sit in a dining chair. Avoid sitting in a soft couch or armchair.  Be quiet. Do not talk.   Medication Instructions:  No changes  If you need a refill on your cardiac medications before your next appointment, please call your pharmacy.    Lab work: No new labs needed   If you have labs (blood work) drawn today and your tests are completely normal, you will receive your results only by: Marland Kitchen MyChart Message (if you have MyChart) OR . A paper copy in the mail If you have any lab test that is abnormal or we need to change your treatment, we will call you to review the results.   Testing/Procedures: No new testing needed   Follow-Up: At [facility], you and your health needs are our priority.  As part of our continuing mission to provide you with exceptional heart care, we have created designated Provider Care Teams.  These Care Teams include your primary Cardiologist (physician) and Advanced Practice Providers (APPs -  Physician Assistants and Nurse Practitioners) who all work together to provide you with the care you need, when you need it.  . You will need a follow up appointment in 12 months  . Providers on your designated Care Team:   . Murray Hodgkins, NP . Christell Faith, PA-C . Marrianne Mood, PA-C  Any Other Special Instructions Will Be Listed Below (If Applicable).  COVID-19 Vaccine Information can be found at: [email or web address].uk For questions related to vaccine distribution or appointments, please email  [email or web address] or call [phone].     How to Take Your Blood Pressure You can take your blood pressure at home with a machine. You may need to check your blood pressure at home:  To check if you have high blood pressure (hypertension).  To check your blood pressure over time.  To make sure your blood pressure medicine is working. Supplies needed: You will need a blood pressure machine, or monitor. You can buy one at a drugstore or online. When choosing one:  Choose one with an arm cuff.  Choose one that wraps around your upper arm. Only one finger should fit between your arm and the cuff.  Do not choose one that measures your blood pressure from your wrist or finger. Your doctor can suggest a monitor. How to prepare Avoid these things for 30 minutes before checking your blood pressure:  Drinking caffeine.  Drinking alcohol.  Eating.  Smoking.  Exercising. Five minutes before checking your blood pressure:  Pee.  Sit in a dining chair. Avoid sitting in a soft couch or armchair.  Be quiet. Do not talk. How to take your blood pressure Follow the instructions that came with your machine. If you have a digital blood pressure monitor, these may be the instructions: 1. Sit up straight. 2. Place your feet on the floor. Do not cross your ankles or legs. 3. Rest your left arm at the level of your heart. You may rest it on a table, desk, or chair. 4. Pull up your shirt sleeve. 5. Wrap the blood pressure cuff around  the upper part of your left arm. The cuff should be 1 inch (2.5 cm) above your elbow. It is best to wrap the cuff around bare skin. 6. Fit the cuff snugly around your arm. You should be able to place only one finger between the cuff and your arm. 7. Put the cord inside the groove of your elbow. 8. Press the power button. 9. Sit quietly while the cuff fills with air and loses air. 10. Write down the numbers on the screen. 11. Wait 2-3 minutes and then  repeat steps 1-10. What do the numbers mean? Two numbers make up your blood pressure. The first number is called systolic pressure. The second is called diastolic pressure. An example of a blood pressure reading is "120 over 80" (or 120/80). If you are an adult and do not have a medical condition, use this guide to find out if your blood pressure is normal: Normal  First number: below 120.  Second number: below 80. Elevated  First number: 120-129.  Second number: below 80. Hypertension stage 1  First number: 130-139.  Second number: 80-89. Hypertension stage 2  First number: 140 or above.  Second number: 17 or above. Your blood pressure is above normal even if only the top or bottom number is above normal. Follow these instructions at home:  Check your blood pressure as often as your doctor tells you to.  Take your monitor to your next doctor's appointment. Your doctor will: ? Make sure you are using it correctly. ? Make sure it is working right.  Make sure you understand what your blood pressure numbers should be.  Tell your doctor if your medicines are causing side effects. Contact a doctor if:  Your blood pressure keeps being high. Get help right away if:  Your first blood pressure number is higher than 180.  Your second blood pressure number is higher than 120. This information is not intended to replace advice given to you by your health care provider. Make sure you discuss any questions you have with your health care provider. Document Revised: 03/02/2017 Document Reviewed: 08/27/2015 Elsevier Patient Education  2020 [employer].

## 2019-12-01 ENCOUNTER — Other Ambulatory Visit: Payer: Self-pay

## 2019-12-01 ENCOUNTER — Ambulatory Visit: Payer: PPO | Admitting: Dermatology

## 2019-12-01 ENCOUNTER — Telehealth: Payer: Self-pay | Admitting: Cardiovascular Disease

## 2019-12-01 DIAGNOSIS — D485 Neoplasm of uncertain behavior of skin: Secondary | ICD-10-CM | POA: Diagnosis not present

## 2019-12-01 NOTE — Patient Instructions (Signed)
Will refer to Dr. Mingo Amber [phone]

## 2019-12-01 NOTE — Telephone Encounter (Signed)
Pt c/o BP issue: STAT if pt c/o blurred vision, one-sided weakness or slurred speech  1. What are your last 5 BP readings?    177/83  186/97  163/79  205/90    2. Are you having any other symptoms (ex. Dizziness, headache, blurred vision, passed out)?   No symptoms   3. What is your BP issue?    Recently stopped amlodipine because patient says he feels better not taking   just started taking again last night because bp increase and wants to clarify dose   Please call after 3

## 2019-12-01 NOTE — Telephone Encounter (Signed)
DPR on file. Spoke with the patients wife. The patient was seen by Dr. Rockey Situ the patient had d/c Amlodipine on his own. Patient was instructed by Dr. Rockey Situ to monitor his BP at home. If his BP is elevated at home patient should resume Amlodipine 5 mg daily.  Patient resumed Amlodipine 5 mg daily yesterday. Adv the patient wife to have the patient continue to monitor his BP daily for the next week and to call the office with the readings to insure the patients BP is being controlled with adding back Amlodipine.  Pt wife sts that the patient has a good supply at home and does not need a refill sent in at this time.  FY update fwd to Dr. Rockey Situ.

## 2019-12-01 NOTE — Progress Notes (Signed)
   Follow-Up Visit   Subjective  Raymond Sampson is a 79 y.o. male who presents for the following: Skin Problem (Patient here today for a knot on his left hand.). Present for over a year, not sore but has gotten larger.   The following portions of the chart were reviewed this encounter and updated as appropriate:  Tobacco  Allergies  Meds  Problems  Med Hx  Surg Hx  Fam Hx     Review of Systems:  No other skin or systemic complaints except as noted in HPI or Assessment and Plan.  Objective  Well appearing patient in no apparent distress; mood and affect are within normal limits.  A focused examination was performed including left hand. Relevant physical exam findings are noted in the Assessment and Plan.  Objective  Left Dorsum Hand between 3rd and 4th MCP: Soft rubbery papule 2.5 x 2.0cm      Assessment & Plan  Neoplasm of uncertain behavior of skin Left Dorsum Hand between 3rd and 4th MCP Thrombus vs Hemangioma vs Lipoma vs Other  Will refer to Dr. Mingo Amber - Plastic Surgeon with special training in Hand Surgery for evaluation/treatment/surgery.   Other Related Procedures Ambulatory referral to Plastic Surgery  Return if symptoms worsen or fail to improve.  Graciella Belton, RMA, am acting as scribe for Sarina Ser, MD . Documentation: I have reviewed the above documentation for accuracy and completeness, and I agree with the above.  Sarina Ser, MD

## 2019-12-05 ENCOUNTER — Encounter: Payer: Self-pay | Admitting: Dermatology

## 2019-12-10 ENCOUNTER — Encounter: Payer: Self-pay | Admitting: Plastic Surgery

## 2019-12-10 ENCOUNTER — Ambulatory Visit: Payer: PPO | Admitting: Plastic Surgery

## 2019-12-10 ENCOUNTER — Other Ambulatory Visit: Payer: Self-pay

## 2019-12-10 VITALS — BP 158/56 | HR 66 | Temp 98.4°F | Ht 67.0 in | Wt 156.0 lb

## 2019-12-10 DIAGNOSIS — L989 Disorder of the skin and subcutaneous tissue, unspecified: Secondary | ICD-10-CM

## 2019-12-10 NOTE — Progress Notes (Signed)
Referring Provider [name], MD [place],  [place] [address]   CC:  Chief Complaint  Patient presents with  . Consult      Raymond Sampson is an 79 y.o. male.  HPI: Patient presents with complaints of dorsal left hand mass.  Is been present for over a year.  It started as a small pea-sized lesion and has grown quite a bit larger.  He is bothered by it when he knocks it on things while putting his hand in and out of his pocket and normal hand use.  He would like it to be removed.  Allergies  Allergen Reactions  . Simvastatin Rash    rash    Outpatient Encounter Medications as of 12/10/2019  Medication Sig  . amLODipine-atorvastatin (CADUET) 5-10 MG tablet Take 1 tablet by mouth daily.  Marland Kitchen aspirin 81 MG tablet Take 81 mg by mouth daily.   . clopidogrel (PLAVIX) 75 MG tablet TAKE 1 TABLET (75 MG TOTAL) BY MOUTH DAILY WITH BREAKFAST.  . cyanocobalamin 1000 MCG tablet Take 1,000 mcg by mouth daily.  Marland Kitchen lisinopril (ZESTRIL) 20 MG tablet Take 1 tablet (20 mg total) by mouth daily.  . metFORMIN (GLUCOPHAGE) 1000 MG tablet Take 1,000 mg by mouth 2 (two) times daily with a meal.  . metoprolol tartrate (LOPRESSOR) 25 MG tablet TAKE 1 TABLET BY MOUTH TWICE A DAY  . sertraline (ZOLOFT) 25 MG tablet Take 1 tablet (25 mg total) by mouth daily.  . simvastatin (ZOCOR) 20 MG tablet TAKE 1 TABLET BY MOUTH EVERY DAY   No facility-administered encounter medications on file as of 12/10/2019.     Past Medical History:  Diagnosis Date  . Arthritic-like pain   . CAD (coronary artery disease)    a. apical MI 5/07 with LHC showing 50% pCFX, 30% pRCA, 99% pLAD, 99% mLAD, 75% dLAD (cypher DES x3 to LAD); b. ETT-myoview (12/08) 81% MPHR,  EF 64%, partially reversible inferoapical perfusion defect similar to prior study 12/07; c. 04/2016 MV: EF 59%, fixed anteroapical defect, no ischemia->Low risk.  . Carotid arterial disease ([place])    a. 05/2016 Carotid U/S: 30-40% bilat dzs, f/u in 2  yrs.  . CVA (cerebral vascular accident) ([place])    a. 07/2010 - h/o CVA/TIA.  . Diet-controlled type 2 diabetes mellitus ([place])   . HLD (hyperlipidemia)   . HTN (hypertension)   . PAT (paroxysmal atrial tachycardia) ([place])    a. 06/2016 Event monitor:  rare episodes of atrial tachycardia, longest 7 beats. No afib.    Past Surgical History:  Procedure Laterality Date  . BACK SURGERY    . CARDIAC CATHETERIZATION    . CORONARY ANGIOPLASTY WITH STENT PLACEMENT     apical MI 5/07 with LHC showing 50% pCFX, 30% pRCA, 99% pLAD, 9%% mLAD, 75% dLAD. cypher DES x3 to LAD. ETT-myoview (12/08) 81% MPHR, 8'1, EF 64%, partially reversible inferoapical perfusion defect similar to prior study 12/07    Family History  Family history unknown: Yes    Social History   Social History Narrative   Part time work at [employer]. Does not regularly exercise.      Review of Systems General: Denies fevers, chills, weight loss CV: Denies chest pain, shortness of breath, palpitations  Physical Exam Vitals with BMI 12/10/2019 11/25/2019 05/29/2019  Height 5\' 7"  5\' 7"  5\' 7"   Weight 156 lbs 156 lbs 2 oz 162 lbs 4 oz  BMI 24.43 66.29 47.65  Systolic 465 035  867  Diastolic 56 60 72  Pulse 66 62 61    General:  No acute distress,  Alert and oriented, Non-Toxic, Normal speech and affect Left hand: Fingers well-perfused with normal cap refill and a palpable radial pulse.  Sensation is intact throughout.  He has normal range of motion.  Dorsally at the level of the long finger MP joint there is a freely mobile subcutaneous cystic mass.  It does not move with finger range of motion.  Assessment/Plan Patient presents with a subcutaneous cystic mass on the dorsal aspect of the left hand.  We discussed excision.  We discussed the risks include bleeding, infection, damage to surrounding structures, need for additional procedures.  He is fully aware and would like to proceed.  We will plan to do this under local in the  office.  All of his questions were answered.  Raymond Sampson 12/10/2019, 11:37 AM

## 2019-12-15 ENCOUNTER — Encounter: Payer: Self-pay | Admitting: Plastic Surgery

## 2019-12-15 ENCOUNTER — Other Ambulatory Visit: Payer: Self-pay | Admitting: Cardiovascular Disease

## 2019-12-15 ENCOUNTER — Other Ambulatory Visit: Payer: Self-pay

## 2019-12-15 ENCOUNTER — Other Ambulatory Visit (HOSPITAL_COMMUNITY)
Admission: RE | Admit: 2019-12-15 | Discharge: 2019-12-15 | Disposition: A | Discharge status: Home / Self Care | Payer: PPO | Source: Ambulatory Visit | Attending: Plastic Surgery | Admitting: Plastic Surgery

## 2019-12-15 ENCOUNTER — Ambulatory Visit (INDEPENDENT_AMBULATORY_CARE_PROVIDER_SITE_OTHER): Payer: PPO | Admitting: Plastic Surgery

## 2019-12-15 VITALS — BP 165/72 | HR 62 | Temp 98.0°F

## 2019-12-15 DIAGNOSIS — I8390 Asymptomatic varicose veins of unspecified lower extremity: Secondary | ICD-10-CM | POA: Diagnosis not present

## 2019-12-15 DIAGNOSIS — R2232 Localized swelling, mass and lump, left upper limb: Secondary | ICD-10-CM | POA: Insufficient documentation

## 2019-12-15 NOTE — Progress Notes (Signed)
Operative Note   DATE OF OPERATION: 12/15/2019  LOCATION:    SURGICAL DEPARTMENT: Plastic Surgery  PREOPERATIVE DIAGNOSES: Dorsal left hand mass  POSTOPERATIVE DIAGNOSES:  same  PROCEDURE:  1. Excision of dorsal left hand mass measuring 3 cm 2. Complex closure measuring 3 cm  SURGEON: Talmadge Coventry, MD  ANESTHESIA:  Local  COMPLICATIONS: None.   INDICATIONS FOR PROCEDURE:  The patient, Raymond Sampson is a 79 y.o. male born on [birth year], is here for treatment of dorsal left hand mass that is increasing in size MRN: [id number]  CONSENT:  Informed consent was obtained directly from the patient. Risks, benefits and alternatives were fully discussed. Specific risks including but not limited to bleeding, infection, hematoma, seroma, scarring, pain, infection, wound healing problems, and need for further surgery were all discussed. The patient did have an ample opportunity to have questions answered to satisfaction.   DESCRIPTION OF PROCEDURE:  Local anesthesia was administered. The patient's operative site was prepped and draped in a sterile fashion. A time out was performed and all information was confirmed to be correct.  The lesion was excised with a 15 blade and tenotomy dissection.  This was a subcutaneous mass.  Hemostasis was obtained.  Circumferential undermining was performed and the skin was advanced and closed in layers with interrupted buried Monocryl sutures and 4-0 Monocryl for the skin.  The lesion excised measured 3 cm, and the total length of closure measured 3 cm.    The patient tolerated the procedure well.  There were no complications.

## 2019-12-18 LAB — SURGICAL PATHOLOGY

## 2019-12-29 ENCOUNTER — Other Ambulatory Visit: Payer: Self-pay | Admitting: Cardiovascular Disease

## 2019-12-30 ENCOUNTER — Other Ambulatory Visit: Payer: Self-pay | Admitting: Cardiovascular Disease

## 2020-01-01 ENCOUNTER — Other Ambulatory Visit: Payer: Self-pay

## 2020-01-01 ENCOUNTER — Encounter: Payer: Self-pay | Admitting: Plastic Surgery

## 2020-01-01 ENCOUNTER — Ambulatory Visit (INDEPENDENT_AMBULATORY_CARE_PROVIDER_SITE_OTHER): Payer: PPO | Admitting: Plastic Surgery

## 2020-01-01 VITALS — HR 67 | Temp 98.6°F

## 2020-01-01 DIAGNOSIS — R2232 Localized swelling, mass and lump, left upper limb: Secondary | ICD-10-CM

## 2020-01-01 NOTE — Progress Notes (Signed)
   Subjective:     Patient ID: Raymond Sampson, male    DOB: 1940-04-29, 79 y.o.   MRN: [id number]  Chief Complaint  Patient presents with  . Follow-up    HPI: The patient is a 79 y.o. male here for follow-up after undergoing excision of dorsal left hand mass (3 cm) on 12/15/19 with Dr. Claudia Desanctis. He is accompanied today by his wife. Denies pain, F/C, N/V. Incision is healing nicely, c/d/i. No signs of infection, redness, or drainage. Full ROM fingers.    Pathology results (reviewed with patient): Thrombosed varix  Review of Systems  Constitutional: Negative for chills and fever.  Respiratory: Negative for shortness of breath.   Cardiovascular: Negative for chest pain.  Gastrointestinal: Negative for abdominal pain, nausea and vomiting.  Skin: Negative for color change and rash.     Objective:   Vital Signs Pulse 67   Temp 98.6 F (37 C) (Oral)   SpO2 97%  Vital Signs and Nursing Note Reviewed  Physical Exam Vitals and nursing note reviewed.  Constitutional:      General: He is not in acute distress.    Appearance: Normal appearance. He is normal weight. He is not ill-appearing.  HENT:     Head: Normocephalic and atraumatic.  Eyes:     Extraocular Movements: Extraocular movements intact.  Pulmonary:     Effort: Pulmonary effort is normal.  Musculoskeletal:        General: Normal range of motion.     Cervical back: Normal range of motion.     Comments: Incision healing well, c/d/i. No signs of infection, redness, or drainage. Suture knots present. Full ROM of fingers/hand.   Skin:    General: Skin is warm and dry.     Coloration: Skin is not pale.     Findings: No erythema or rash.  Neurological:     Mental Status: He is alert and oriented to person, place, and time.     Gait: Gait is intact.  Psychiatric:        Mood and Affect: Mood and affect normal.        Behavior: Behavior normal.        Thought Content: Thought content normal.        Cognition and Memory:  Memory normal.        Judgment: Judgment normal.       Assessment/Plan:     ICD-10-CM   1. Mass of hand, left  R22.32     Incision is healing very nicely, C/D/I.  No signs of infection, redness, or drainage.  Patient denies pain.  Suture knots were removed.  Pathology reviewed with patient.  May apply Vaseline for moisture to skin around the incision.  Follow-up as needed.  Call office with any questions/concerns.  The [place] was signed into law in 2016 which includes the topic of electronic health records.  This provides immediate access to information in MyChart.  This includes consultation notes, operative notes, office notes, lab results and pathology reports.  If you have any questions about what you read please let us know at your next visit or call us at the office.  We are right here with you.   Threasa Heads, PA-C 01/01/2020, 8:38 PM

## 2020-01-19 DIAGNOSIS — H40003 Preglaucoma, unspecified, bilateral: Secondary | ICD-10-CM | POA: Diagnosis not present

## 2020-02-06 DIAGNOSIS — E119 Type 2 diabetes mellitus without complications: Secondary | ICD-10-CM | POA: Diagnosis not present

## 2020-02-13 DIAGNOSIS — E7800 Pure hypercholesterolemia, unspecified: Secondary | ICD-10-CM | POA: Diagnosis not present

## 2020-02-13 DIAGNOSIS — I259 Chronic ischemic heart disease, unspecified: Secondary | ICD-10-CM | POA: Diagnosis not present

## 2020-02-13 DIAGNOSIS — F32A Depression, unspecified: Secondary | ICD-10-CM | POA: Diagnosis not present

## 2020-02-13 DIAGNOSIS — Z8673 Personal history of transient ischemic attack (TIA), and cerebral infarction without residual deficits: Secondary | ICD-10-CM | POA: Diagnosis not present

## 2020-02-13 DIAGNOSIS — E119 Type 2 diabetes mellitus without complications: Secondary | ICD-10-CM | POA: Diagnosis not present

## 2020-02-13 DIAGNOSIS — G250 Essential tremor: Secondary | ICD-10-CM | POA: Diagnosis not present

## 2020-02-13 DIAGNOSIS — I10 Essential (primary) hypertension: Secondary | ICD-10-CM | POA: Diagnosis not present

## 2020-02-13 DIAGNOSIS — Z23 Encounter for immunization: Secondary | ICD-10-CM | POA: Diagnosis not present

## 2020-08-05 DIAGNOSIS — E119 Type 2 diabetes mellitus without complications: Secondary | ICD-10-CM | POA: Diagnosis not present

## 2020-08-12 ENCOUNTER — Other Ambulatory Visit: Payer: Self-pay | Admitting: Cardiovascular Disease

## 2020-08-12 ENCOUNTER — Telehealth: Payer: Self-pay | Admitting: Cardiovascular Disease

## 2020-08-12 DIAGNOSIS — I10 Essential (primary) hypertension: Secondary | ICD-10-CM | POA: Diagnosis not present

## 2020-08-12 DIAGNOSIS — F32A Depression, unspecified: Secondary | ICD-10-CM | POA: Diagnosis not present

## 2020-08-12 DIAGNOSIS — R413 Other amnesia: Secondary | ICD-10-CM | POA: Diagnosis not present

## 2020-08-12 DIAGNOSIS — G250 Essential tremor: Secondary | ICD-10-CM | POA: Diagnosis not present

## 2020-08-12 DIAGNOSIS — Z0000 Encounter for general adult medical examination without abnormal findings: Secondary | ICD-10-CM | POA: Diagnosis not present

## 2020-08-12 DIAGNOSIS — E119 Type 2 diabetes mellitus without complications: Secondary | ICD-10-CM | POA: Diagnosis not present

## 2020-08-12 DIAGNOSIS — E7800 Pure hypercholesterolemia, unspecified: Secondary | ICD-10-CM | POA: Diagnosis not present

## 2020-08-12 DIAGNOSIS — Z0001 Encounter for general adult medical examination with abnormal findings: Secondary | ICD-10-CM | POA: Diagnosis not present

## 2020-08-12 DIAGNOSIS — I259 Chronic ischemic heart disease, unspecified: Secondary | ICD-10-CM | POA: Diagnosis not present

## 2020-08-12 DIAGNOSIS — E538 Deficiency of other specified B group vitamins: Secondary | ICD-10-CM | POA: Diagnosis not present

## 2020-08-12 MED ORDER — AMLODIPINE-ATORVASTATIN 5-10 MG PO TABS: 1.0000 | ORAL_TABLET | Freq: Every day | ORAL | 3 refills | Status: DC

## 2020-08-12 NOTE — Telephone Encounter (Signed)
See telephone note from 11/2019. Pt did not call back with BP readings since restarting Amlodipine on his own in August. OK to go ahead with refill? Or contact pt first?  Please advise. Thank you!

## 2020-08-12 NOTE — Telephone Encounter (Signed)
Please call pt to schedule appt to discuss BP. Requesting refill for amlodipine which needs to be monitored. Thank you!

## 2020-08-12 NOTE — Telephone Encounter (Signed)
Called patient to schedule Spoke with patient spouse - states they do not want an appt  Please call to discuss

## 2020-08-12 NOTE — Telephone Encounter (Signed)
*  STAT* If patient is at the pharmacy, call can be transferred to refill team.   1. Which medications need to be refilled? (please list name of each medication and dose if known) amlodipine 5 mg  2. Which pharmacy/location (including street and city if local pharmacy) is medication to be sent to? [facility] in [place]  3. Do they need a 30 day or 90 day supply? [place]

## 2020-08-12 NOTE — Telephone Encounter (Signed)
Amlodipine-atorvastatin combo pill has been refilled Can we make sure he is not taking simvastatin.  That should be off the list

## 2020-08-13 NOTE — Telephone Encounter (Signed)
I called and spoke with Mrs. Wagone (ok per DPR).  I have advised her Dr. Rockey Situ has sent in amlodipine-atorvastatin 5-10 mg once daily to the pharmacy. She confirms he is taking simvastatin. I have advised her to have the patient stop the simvastatin and just take the combination amlodipine-atorvastatin that Dr. Rockey Situ sent in.  Mrs. Stiggers voices understanding and is agreeable.

## 2020-08-17 NOTE — Telephone Encounter (Signed)
Patient calling wants to discuss the change to medication & price.

## 2020-08-17 NOTE — Telephone Encounter (Signed)
Was able to reach back out to pt's wife (DPR approved), she is questioning the switch to combo pill amlodipine-atorvastatin 5-10 mg and as to why pt was taken off simvastatin. Wife reports pt has been good on simvastatin w/o any complaints. Reports his co-pays have been $0, but when Mrs. Perazzo went to pick up his new combo pill it as $20 for a 90-day supply. Advised, was unsure of why Dr. Rockey Situ made the switch.  Mrs. Gillen also reports insurance sent a letter advising time to make an appt for yearly check-up, would like to go ahead and schedule this appt and get the  amlodipine-atorvastatin 5-10 mg and simvastatin straighten out. Advised was a good idea, they are okay with seeing same provider as last Aug.  Pt schedule 5/19 at 08:00 am with Blanche East, PA-C.  Mrs. Kolakowski very thankful for the return call, will keep medication at pharmacy until confirmation at up coming to see what medication pt should be taking.

## 2020-08-18 NOTE — Progress Notes (Signed)
Office Visit    Patient Name: Raymond Sampson Date of Encounter: 08/19/2020  Primary Care Provider:  [name], MD Primary Cardiologist:  Ida Rogue, MD  Chief Complaint    80 yo male with history of CAD with apical MI 08/2005 s/p stenting x3 to the LAD, TIA/CVA 07/2010, atrial tachycardia, bilateral carotid artery dz, dementia, DM2, HTN, HLD, and prior tobacco use for 30 years quitting >25 years prior, and who presents for 6 month follow-up.  Past Medical History    Past Medical History:  Diagnosis Date  . Arthritic-like pain   . CAD (coronary artery disease)    a. apical MI 5/07 with LHC showing 50% pCFX, 30% pRCA, 99% pLAD, 99% mLAD, 75% dLAD (cypher DES x3 to LAD); b. ETT-myoview (12/08) 81% MPHR,  EF 64%, partially reversible inferoapical perfusion defect similar to prior study 12/07; c. 04/2016 MV: EF 59%, fixed anteroapical defect, no ischemia->Low risk.  . Carotid arterial disease ([place])    a. 05/2016 Carotid U/S: 30-40% bilat dzs, f/u in 2 yrs.  . CVA (cerebral vascular accident) ([place])    a. 07/2010 - h/o CVA/TIA.  . Diet-controlled type 2 diabetes mellitus ([place])   . HLD (hyperlipidemia)   . HTN (hypertension)   . PAT (paroxysmal atrial tachycardia) ([place])    a. 06/2016 Event monitor:  rare episodes of atrial tachycardia, longest 7 beats. No afib.   Past Surgical History:  Procedure Laterality Date  . BACK SURGERY    . CARDIAC CATHETERIZATION    . CORONARY ANGIOPLASTY WITH STENT PLACEMENT     apical MI 5/07 with LHC showing 50% pCFX, 30% pRCA, 99% pLAD, 9%% mLAD, 75% dLAD. cypher DES x3 to LAD. ETT-myoview (12/08) 81% MPHR, 8'1, EF 64%, partially reversible inferoapical perfusion defect similar to prior study 12/07    Allergies  No Known Allergies  History of Present Illness    80 yo male with PMH as above and presenting for 6 month follow-up of CAD, carotid artery dz, and atrial tachycardia. He suffered an MI 08/2005 that was treated with PCI/DES x3 to  LAD. 2013 MPI low risk though with severely elevated BP. 2014 TTE showed EF 60-65%, DD. He was seen in 2018 for palpitations and abnormal EKG with subsequent 04/2016 MPI ruled low risk. Cardiac monitoring 05/2016 showed NSR with 7 runs of AT (longest 7 beats). No evidence of Afib. Updated carotid artery ultrasound 12/2018 showed right and left ICA 1-39% stenosis.   He was seen in the office 11/22/2018 and was doing well from a cardiac standpoint. He was not regularly checking his BP at home; however, it was noted his BP was 299B systolic when he last saw his PCP though clinic BP was noted to be mildly elevated. Per documentation, patient and wife preferred to defer any antihypertensive changes at that time.    Seen 05/2019 and had some left-sided "twinge-like" CP that lasted only seconds s/p his first coronavirus vaccine to his right arm.He had occasional and even recent melena. He did not drink water often, because he did not like it very much (also reported trouble swallowing the water but did not struggle to drink other liquids or food). He had intermittent neuropathy like symptoms in bed but not during the day. No regular exercise routine. He continued to live a sedentary lifestyle. No BP or weights at home.  Wt 162lbs (5 lbs up, 157 at previous clinic visit) though euvolemic on exam.  Changes to statin/Zetia was deferred at that  time with patient preference to try lifestyle changes and natural remedies.  He was going to bring up his neuropathy with his PCP.  He continued on DAPT for history of TIA/CVA.  CBC ordered for melena unstable.  Encourage BP monitoring at home.  Seen by his primary cardiologist 11/2019 without symptoms of angina.  He was compliant with his simvastatin (though noted on allergies?) with LDL slightly above goal.  He had held his amlodipine for nonspecific issues, possibly related to his gabapentin per documentation.  It was noted that if BP ran high at home, amlodipine would need  restarted.  Follow-up recommended in 12 months.  Today, 08/19/2020, he is seen today in clinic for 68-month checkup and reports that he is doing well from a cardiac standpoint.  He reports the occasional to rare twinge like chest pain, lasting only seconds, and as reported in the past.  He continues to struggle with drinking water, and sometimes has trouble swallowing, but has not choked on any food.  Esophageal stricture was discussed, in the event that his symptoms progress and he needs to speak with primary care.  He does continue to avoid water with increased water intake encouraged, especially if drinking caffeine.  He does limit salt and also makes an attempt to avoid salt at restaurants, often moving the saltshaker away.  He reports occasional bloating after meals but denies bloating at other times.  He is able to lie flat in bed and does not wake up short of breath.  No increase in dyspnea reported, though he does report some deconditioning.  He does have some heavy breathing at night, though prefers to defer a sleep apnea study/watch Pat at this time.  He states his breathing is stable when compared with previous visits.  He does not regularly weigh himself at home but reports stable weight.  He continues to experience intermittent neuropathy symptoms at night and no longer is on gabapentin with symptoms stable.  Right ankle edema is noted, attributed to crossing his right leg over that of his left and resolving by morning.  No regular exercise routine with increased walking encouraged.  He does not monitor his BP or weights at home, but a BP cuff is available with recommendation to call the office if BP consistently over 237 systolic as previously noted by his primary cardiologist.   BP today elevated at 142/72; however, he has not taken his amlodipine.  They need refills on amlodipine today, which will be provided.  They need amlodipine-atorvastatin separated out, as they do not wish to take atorvastatin  and want to return to simvastatin 20 mg daily.  Since simvastatin is listed as an allergy, we did discuss this in his chart.  He reports rash at certain times of the year and associated with allergies/pollen, rather than his statin medication.  He has been on simvastatin for some time now and continues to take it; therefore, we will remove this allergy from his list.  He has continued on DAPT for history of TIA/CVA without any signs or symptoms of bleeding.  Extrasystole appreciated on exam with patient denying any racing heart rate or palpitations.  No presyncope or amaurosis fugax, concerning for worsening carotid disease.  Home Medications    Prior to Admission medications   Medication Sig Start Date End Date Taking? Authorizing Provider  amLODipine (NORVASC) 10 MG tablet Take 1 tablet (10 mg total) by mouth daily. 03/06/19   Minna Merritts, MD  aspirin 81 MG tablet Take 81  mg by mouth daily.     [provider]  clopidogrel (PLAVIX) 75 MG tablet TAKE 1 TABLET (75 MG TOTAL) BY MOUTH DAILY WITH BREAKFAST. 01/13/19   Minna Merritts, MD  cyanocobalamin 1000 MCG tablet Take 1,000 mcg by mouth daily.    [provider]  gabapentin (NEURONTIN) 100 MG capsule Take 100 mg by mouth at bedtime.    [provider]  lisinopril (ZESTRIL) 20 MG tablet Take 1 tablet (20 mg total) by mouth daily. 01/14/19   Minna Merritts, MD  metFORMIN (GLUCOPHAGE) 1000 MG tablet Take 1,000 mg by mouth 2 (two) times daily with a meal.    [provider]  metoprolol tartrate (LOPRESSOR) 25 MG tablet TAKE 1 TABLET BY MOUTH TWICE A DAY 12/05/18   Minna Merritts, MD  sertraline (ZOLOFT) 25 MG tablet Take 1 tablet (25 mg total) by mouth daily. 04/26/16   Minna Merritts, MD  simvastatin (ZOCOR) 20 MG tablet Take 1 tablet (20 mg total) by mouth daily. 03/17/19   Minna Merritts, MD    Review of Systems    He denies palpitations, dyspnea, PND, orthopnea, n, v, dizziness, syncope,  weight gain, or early satiety. He reports atypical and brief twinges of CP as reported in the past.  He continues to struggle with swallowing water and at times food as previously noted 05/2019.  He reports right ankle edema, though it is also noted that he occasionally crosses his left foot over his right leg, which could be contributing.  He reports bloating occasionally after meals.  He has heavy breathing at night as outlined above.  All other systems reviewed and are otherwise negative except as noted above.  Physical Exam    VS:  BP (!) 142/72 (BP Location: Left Arm, Patient Position: Sitting, Cuff Size: Normal)   Pulse 68   Ht 5\' 7"  (1.702 m)   Wt 160 lb (72.6 kg)   SpO2 94%   BMI 25.06 kg/m  , BMI Body mass index is 25.06 kg/m. GEN: Well nourished, well developed, in no acute distress.  Facemask in place HEENT: normal. Neck: Supple, no JVD, carotid bruits, or masses. Cardiac: RRR with occasional extrasystole appreciated, no murmurs, rubs, or gallops. No clubbing, cyanosis.  Moderate bilateral edema.  Radials/DP/PT 2+ and equal bilaterally.  Respiratory:  Respirations regular and unlabored, clear to auscultation bilaterally. GI: Slightly firm, nontender, nondistended, BS + x 4. MS: no deformity or atrophy. Skin: warm and dry, no rash. Neuro:  Strength and sensation are intact. Psych: Normal affect.  Accessory Clinical Findings    ECG personally reviewed by me today -NSR, 68bpm, nonspecific T wave abnormality and repolarization changes- no acute changes.  VITALS Reviewed   Temp Readings from Last 3 Encounters:  01/01/20 98.6 F (37 C) (Oral)  12/15/19 98 F (36.7 C) (Oral)  12/10/19 98.4 F (36.9 C) (Oral)   BP Readings from Last 3 Encounters:  08/19/20 (!) 142/72  12/15/19 (!) 165/72  12/10/19 (!) 158/56   Pulse Readings from Last 3 Encounters:  08/19/20 68  01/01/20 67  12/15/19 62    Wt Readings from Last 3 Encounters:  08/19/20 160 lb (72.6 kg)  12/10/19 156  lb (70.8 kg)  11/25/19 156 lb 2 oz (70.8 kg)     LABS  reviewed   [facility] Labs present Yes/No: Yes   Labs:  01/31/2019 total cholesterol 152, TG 155, HDL 36.0, LDL 85, Na 139, K 4.1, Cr 1.2, BUN 21, Ca 9.5,  AST/ALT 15, A1C 7.4 2017:TSH 0.894   Lab Results  Component Value Date   WBC 8.1 05/29/2019   HGB 13.4 05/29/2019   HCT 38.9 05/29/2019   MCV 89 05/29/2019   PLT 259 05/29/2019   Lab Results  Component Value Date   CREATININE 1.13 06/06/2017   BUN 26 (H) 06/06/2017   NA 135 06/06/2017   K 3.7 06/06/2017   CL 102 06/06/2017   CO2 22 06/06/2017   Lab Results  Component Value Date   ALT 20 06/05/2017   AST 52 (H) 06/05/2017   ALKPHOS 58 06/05/2017   BILITOT 1.1 06/05/2017   Lab Results  Component Value Date   CHOL 132 05/29/2019   HDL 34 (L) 05/29/2019   LDLCALC 64 05/29/2019   TRIG 204 (H) 05/29/2019   CHOLHDL 3.9 05/29/2019    No results found for: HGBA1C Lab Results  Component Value Date   TSH 0.781 05/29/2019     STUDIES/PROCEDURES reviewed    12/06/2018 Carotid US Summary:  Right Carotid: Velocities in the right ICA are consistent with a 1-39%  stenosis. Non-hemodynamically significant plaque <50% noted in the  CCA. The  ECA appears <50% stenosed.  Left Carotid: Velocities in the left ICA are consistent with a 1-39%  stenosis. Non-hemodynamically significant plaque <50% noted in the  CCA. TheECA appears >50% stenosed. Vertebrals: Bilateral vertebral arteries demonstrate antegrade flow.  Subclavians: Normal flow hemodynamics were seen in bilateral subclavian arteries.   05/02/2016 Myoview Pharmacological myocardial perfusion imaging study with no significant  Ischemia Small region of predominantly fixed defect in the anteroapical wall of mild severity Normal wall motion (GI uptake artifact noted),  EF estimated at 59% No EKG changes concerning for ischemia at peak stress or in recovery. Severe hypertension noted, SBP 180 Low risk  scan Signed, Esmond Plants, MD, Ph.D [facility]  05/12/2016 Monitor Event Monitor Normal sinus rhythm Min HR of 40 bpm, max HR of 171 bpm, and avg HR of 69 bpm.  7 Atrial tachycardia runs occurred, the longest lasting 7 beats with an avg rate of 105 bpm. Isolated SVEs were rare (<1.0%), SVE Couplets were rare (<1.0%), and SVE Triplets were rare (<1.0%).  Isolated VEs were rare (<1.0%), VE Couplets were rare (<1.0%), and no VE Triplets were present. Signed, Esmond Plants, MD, Ph.D [facility]    Assessment & Plan    CAD involving the native coronary arteries without angina  --No recent symptoms concerning for angina.  Euvolemic on exam. 2018 Myoview ruled a low risk study.  Continue DAPT with ASA and Plavix. Continue Lopressor and simvastatin with recommendations below.  No plans for further ischemic evaluation at this time. Encouraged increased activity and reviewed dietary recommendations. Risk factor modification recommended.  Atrial tachycardia  --Denies any racing heart rate, palpitations, presyncope, or syncope.  2018 cardiac monitor as above shows atrial tachycardia.  No signs of Afib. Continue to monitor. Will repeat TSH given last check was 2017. Continue current Lopressor.  Carotid artery disease  --Updated study as above and 1-39% b/l ICA stenosis with nl flow of vertebral and subclavian arteries bilaterally. Recommend lipid and BP control, as well as glycemic control. Continue to monitor.  HTN  --BP elevated in the setting of missing his amlodipine, due to need for refill. Encouraged monitoring BP at home. Lifestyle changes discussed. Continue amlodipine, lisinopril, and metoprolol tartrate.  HLD  --Continue current statin therapy with LDL check per PCP.  Reassess at RTC or per PCP with addition of Zetia  or referral to lipid clinic at that time.  DM2, poorly controlled  --Tight glycemic control recommended for risk factor modification. Reports bilateral LE neuropathy on  gabapentin with current plan to bring this up with his PCP.  TIA/CVA --Remains on DAPT.  Ordered CBC for melena.  Per PCP/neurology.   Medication changes: None. Labs ordered: None Studies / Imaging ordered: None.  Disposition: RTC 6 to 12 months  Total time spent with patient today 45 minutes. This includes reviewing records, evaluating the patient, and coordinating care. Face-to-face time >50%.   Arvil Chaco, PA-C 08/19/2020, 9:07 AM

## 2020-08-19 ENCOUNTER — Encounter: Payer: Self-pay | Admitting: Physician Assistant

## 2020-08-19 ENCOUNTER — Ambulatory Visit (INDEPENDENT_AMBULATORY_CARE_PROVIDER_SITE_OTHER): Payer: PPO | Admitting: Physician Assistant

## 2020-08-19 ENCOUNTER — Other Ambulatory Visit: Payer: Self-pay

## 2020-08-19 VITALS — BP 142/72 | HR 68 | Ht 67.0 in | Wt 160.0 lb

## 2020-08-19 DIAGNOSIS — Z8679 Personal history of other diseases of the circulatory system: Secondary | ICD-10-CM | POA: Diagnosis not present

## 2020-08-19 DIAGNOSIS — Z8673 Personal history of transient ischemic attack (TIA), and cerebral infarction without residual deficits: Secondary | ICD-10-CM

## 2020-08-19 DIAGNOSIS — E1165 Type 2 diabetes mellitus with hyperglycemia: Secondary | ICD-10-CM | POA: Diagnosis not present

## 2020-08-19 DIAGNOSIS — I6523 Occlusion and stenosis of bilateral carotid arteries: Secondary | ICD-10-CM

## 2020-08-19 DIAGNOSIS — E785 Hyperlipidemia, unspecified: Secondary | ICD-10-CM | POA: Diagnosis not present

## 2020-08-19 DIAGNOSIS — I10 Essential (primary) hypertension: Secondary | ICD-10-CM

## 2020-08-19 DIAGNOSIS — I25118 Atherosclerotic heart disease of native coronary artery with other forms of angina pectoris: Secondary | ICD-10-CM | POA: Diagnosis not present

## 2020-08-19 DIAGNOSIS — I471 Supraventricular tachycardia: Secondary | ICD-10-CM

## 2020-08-19 MED ORDER — SIMVASTATIN 20 MG PO TABS: 20.0000 mg | ORAL_TABLET | Freq: Every day | ORAL | 3 refills | Status: DC

## 2020-08-19 MED ORDER — AMLODIPINE BESYLATE 5 MG PO TABS: 5.0000 mg | ORAL_TABLET | Freq: Every day | ORAL | 1 refills | Status: DC

## 2020-08-19 NOTE — Patient Instructions (Addendum)
Medication Instructions:  Your physician recommends that you continue on your current medications as directed. Please refer to the Current Medication list given to you today.  *If you need a refill on your cardiac medications before your next appointment, please call your pharmacy*   Lab Work: None ordered  If you have labs (blood work) drawn today and your tests are completely normal, you will receive your results only by: Marland Kitchen MyChart Message (if you have MyChart) OR . A paper copy in the mail If you have any lab test that is abnormal or we need to change your treatment, we will call you to review the results.   Testing/Procedures: None ordered   Follow-Up: At [facility], you and your health needs are our priority.  As part of our continuing mission to provide you with exceptional heart care, we have created designated Provider Care Teams.  These Care Teams include your primary Cardiologist (physician) and Advanced Practice Providers (APPs -  Physician Assistants and Nurse Practitioners) who all work together to provide you with the care you need, when you need it.  We recommend signing up for the patient portal called "MyChart".  Sign up information is provided on this After Visit Summary.  MyChart is used to connect with patients for Virtual Visits (Telemedicine).  Patients are able to view lab/test results, encounter notes, upcoming appointments, etc.  Non-urgent messages can be sent to your provider as well.   To learn more about what you can do with MyChart, go to [email or web address].    Your next appointment:   6-12 month(s)  The format for your next appointment:   In Person  Provider:   You may see Ida Rogue, MD or one of the following Advanced Practice Providers on your designated Care Team:    Murray Hodgkins, NP  Christell Faith, PA-C  Marrianne Mood, PA-C  Cadence Kathlen Mody, [place]  Laurann Montana, NP    Other Instructions  Check BP at home, Call if  >140s all the time    Make sure to check Blood Pressures 2 hours after your medications.  Please keep a log of your readings so we can see how they trend.  Avoid these things for 30 minutes before checking your blood pressure:  Drinking caffeine. Drinking alcohol. Eating. Smoking. Exercising.   Five minuteCs before checking your blood pressure: Pee. Sit in a dining chair. Avoid sitting in a soft couch or armchair. Be quiet. Do not talk.

## 2020-09-24 ENCOUNTER — Other Ambulatory Visit: Payer: Self-pay | Admitting: Cardiovascular Disease

## 2020-11-22 ENCOUNTER — Other Ambulatory Visit: Payer: Self-pay | Admitting: Cardiovascular Disease

## 2020-11-26 ENCOUNTER — Other Ambulatory Visit: Payer: Self-pay | Admitting: Cardiovascular Disease

## 2021-02-10 ENCOUNTER — Other Ambulatory Visit: Payer: Self-pay | Admitting: *Deleted

## 2021-02-10 MED ORDER — AMLODIPINE BESYLATE 5 MG PO TABS: 5.0000 mg | ORAL_TABLET | Freq: Every day | ORAL | 2 refills | Status: DC

## 2021-02-17 DIAGNOSIS — Z8673 Personal history of transient ischemic attack (TIA), and cerebral infarction without residual deficits: Secondary | ICD-10-CM | POA: Diagnosis not present

## 2021-02-17 DIAGNOSIS — E538 Deficiency of other specified B group vitamins: Secondary | ICD-10-CM | POA: Diagnosis not present

## 2021-02-17 DIAGNOSIS — E119 Type 2 diabetes mellitus without complications: Secondary | ICD-10-CM | POA: Diagnosis not present

## 2021-02-17 DIAGNOSIS — E7800 Pure hypercholesterolemia, unspecified: Secondary | ICD-10-CM | POA: Diagnosis not present

## 2021-02-17 DIAGNOSIS — G250 Essential tremor: Secondary | ICD-10-CM | POA: Diagnosis not present

## 2021-02-17 DIAGNOSIS — Z23 Encounter for immunization: Secondary | ICD-10-CM | POA: Diagnosis not present

## 2021-02-17 DIAGNOSIS — I10 Essential (primary) hypertension: Secondary | ICD-10-CM | POA: Diagnosis not present

## 2021-02-17 DIAGNOSIS — I259 Chronic ischemic heart disease, unspecified: Secondary | ICD-10-CM | POA: Diagnosis not present

## 2021-02-17 DIAGNOSIS — F32A Depression, unspecified: Secondary | ICD-10-CM | POA: Diagnosis not present

## 2021-02-18 ENCOUNTER — Other Ambulatory Visit: Payer: Self-pay | Admitting: Cardiovascular Disease

## 2021-05-23 ENCOUNTER — Other Ambulatory Visit: Payer: Self-pay

## 2021-05-23 MED ORDER — SIMVASTATIN 20 MG PO TABS: 20.0000 mg | ORAL_TABLET | Freq: Every day | ORAL | 0 refills | Status: DC

## 2021-05-31 ENCOUNTER — Encounter: Payer: Self-pay | Admitting: Cardiovascular Disease

## 2021-05-31 ENCOUNTER — Ambulatory Visit: Payer: PPO | Admitting: Cardiovascular Disease

## 2021-05-31 ENCOUNTER — Other Ambulatory Visit: Payer: Self-pay

## 2021-05-31 VITALS — BP 140/60 | HR 66 | Ht 67.0 in | Wt 159.1 lb

## 2021-05-31 DIAGNOSIS — I4719 Other supraventricular tachycardia: Secondary | ICD-10-CM

## 2021-05-31 DIAGNOSIS — I25118 Atherosclerotic heart disease of native coronary artery with other forms of angina pectoris: Secondary | ICD-10-CM

## 2021-05-31 DIAGNOSIS — I6523 Occlusion and stenosis of bilateral carotid arteries: Secondary | ICD-10-CM

## 2021-05-31 DIAGNOSIS — I739 Peripheral vascular disease, unspecified: Secondary | ICD-10-CM

## 2021-05-31 DIAGNOSIS — I471 Supraventricular tachycardia: Secondary | ICD-10-CM | POA: Diagnosis not present

## 2021-05-31 DIAGNOSIS — E1165 Type 2 diabetes mellitus with hyperglycemia: Secondary | ICD-10-CM

## 2021-05-31 DIAGNOSIS — Z8673 Personal history of transient ischemic attack (TIA), and cerebral infarction without residual deficits: Secondary | ICD-10-CM

## 2021-05-31 DIAGNOSIS — E785 Hyperlipidemia, unspecified: Secondary | ICD-10-CM

## 2021-05-31 NOTE — Progress Notes (Signed)
Cardiology Office Note  Date:  05/31/2021   ID:  Raymond Sampson, Raymond Sampson 1940/10/24, MRN [id number]  PCP:  [name], MD   Chief Complaint  Patient presents with   6-12 month follow up     "Doing well." Medications reviewed by the patient verbally.     HPI:  81 yo male with history of  CAD  apical MI in 5/07 with 3 stents placed in the LAD.   h/o CVA and TIA  07/2010, bilateral carotid disease, right side, 50%, 60% on the left, 05/2016 Prior history of smoking for 30 years, stopped smoking 25 years ago Dementia Who presents for routine follow-up of his coronary artery disease  LOV 8/21 Last office visit with myself August 2021 Seen by one of our providers May 2022  Last carotid ultrasound 2020 1 to 39% bilateral internal carotid artery stenosis   Prior stress testing 2018  Eating wrong foods,  A1C >8 Long discussion concerning his high carbohydrates, breads  Wife concerned about his lack of activity, leg weakness Mild dementia, symptoms have been stable per the wife No recent falls, uses a cane sometimes  Previously quit amlodipine on his own, Also quit gabapentin   followed by neurology, [name]  stroke history 2012, etiology unclear.   EKG personally reviewed by myself on todays visit Shows normal sinus rhythm rate 66 bpm, T-wave changes V3 to V4  Other past medical history stress test 12/08  showed a partially reversible inferoapical defect similar to prior stress in 12/07.  Echo in 3/11 showed preserved EF.   PMH:   has a past medical history of Arthritic-like pain, CAD (coronary artery disease), Carotid arterial disease ([place]), CVA (cerebral vascular accident) ([place]), Diet-controlled type 2 diabetes mellitus ([place]), HLD (hyperlipidemia), HTN (hypertension), and PAT (paroxysmal atrial tachycardia) ([place]).  PSH:    Past Surgical History:  Procedure Laterality Date   BACK SURGERY     CARDIAC CATHETERIZATION     CORONARY ANGIOPLASTY WITH STENT PLACEMENT      apical MI 5/07 with LHC showing 50% pCFX, 30% pRCA, 99% pLAD, 9%% mLAD, 75% dLAD. cypher DES x3 to LAD. ETT-myoview (12/08) 81% MPHR, 8'1, EF 64%, partially reversible inferoapical perfusion defect similar to prior study 12/07    Current Outpatient Medications  Medication Sig Dispense Refill   amLODipine (NORVASC) 5 MG tablet Take 1 tablet (5 mg total) by mouth daily. 90 tablet 2   aspirin 81 MG tablet Take 81 mg by mouth daily.     clopidogrel (PLAVIX) 75 MG tablet TAKE 1 TABLET (75 MG TOTAL) BY MOUTH DAILY WITH BREAKFAST. 90 tablet 3   cyanocobalamin 1000 MCG tablet Take 1,000 mcg by mouth daily.     lisinopril (ZESTRIL) 20 MG tablet TAKE 1 TABLET BY MOUTH EVERY DAY 90 tablet 3   metFORMIN (GLUCOPHAGE) 1000 MG tablet Take 1,000 mg by mouth 2 (two) times daily with a meal.     metoprolol tartrate (LOPRESSOR) 25 MG tablet Take 1 tablet (25 mg total) by mouth 2 (two) times daily. 180 tablet 2   sertraline (ZOLOFT) 25 MG tablet Take 1 tablet (25 mg total) by mouth daily.     simvastatin (ZOCOR) 20 MG tablet Take 1 tablet (20 mg total) by mouth daily. 90 tablet 0   sitaGLIPtin (JANUVIA) 25 MG tablet Take 1 tablet by mouth daily.     No current facility-administered medications for this visit.    Allergies:   Patient has no known allergies.   Social  History:  The patient  reports that he quit smoking about 36 years ago. His smoking use included cigarettes. He has a 25.00 pack-year smoking history. He has never used smokeless tobacco. He reports that he does not drink alcohol and does not use drugs.   Family History:   Family history is unknown by patient.    Review of Systems: Review of Systems  Constitutional: Negative.   Respiratory: Negative.    Cardiovascular: Negative.   Gastrointestinal: Negative.   Musculoskeletal: Negative.   Neurological: Negative.   Psychiatric/Behavioral: Negative.    All other systems reviewed and are negative.   PHYSICAL EXAM: VS:  BP 140/60 (BP  Location: Left Arm, Patient Position: Sitting, Cuff Size: Normal)    Pulse 66    Ht 5\' 7"  (1.702 m)    Wt 159 lb 2 oz (72.2 kg)    SpO2 98%    BMI 24.92 kg/m  , BMI Body mass index is 24.92 kg/m. Constitutional:  oriented to person, place, and time. No distress.  HENT:  Head: Grossly normal Eyes:  no discharge. No scleral icterus.  Neck: No JVD, no carotid bruits  Cardiovascular: Regular rate and rhythm, no murmurs appreciated Pulmonary/Chest: Clear to auscultation bilaterally, no wheezes or rails Abdominal: Soft.  no distension.  no tenderness.  Musculoskeletal: Normal range of motion Neurological:  normal muscle tone. Coordination normal. No atrophy Skin: Skin warm and dry Psychiatric: normal affect, pleasant   Recent Labs: No results found for requested labs within last 8760 hours.    Lipid Panel Lab Results  Component Value Date   CHOL 132 05/29/2019   HDL 34 (L) 05/29/2019   LDLCALC 64 05/29/2019   TRIG 204 (H) 05/29/2019      Wt Readings from Last 3 Encounters:  05/31/21 159 lb 2 oz (72.2 kg)  08/19/20 160 lb (72.6 kg)  12/10/19 156 lb (70.8 kg)     ASSESSMENT AND PLAN:  Atherosclerosis of native coronary artery of native heart without angina pectoris -  Currently with no symptoms of angina. No further workup at this time. Continue current medication regimen. Low risk scan January 2018 No ischemia  Atrial flutter, unspecified type ([place]) -  Maintaining normal sinus rhythm Prior event monitor Denies symptoms of tachypalpitations, no changes to his medications  Stenosis of carotid artery, unspecified laterality - Stable disease mild to moderate Stressed importance of diabetes control, cholesterol at goal  Palpitations - No recent symptoms  Essential hypertension Blood pressure high and what is range,  Recommend he monitor blood pressures at home   Total encounter time more than 30 minutes  Greater than 50% was spent in counseling and coordination of care  with the patient    No orders of the defined types were placed in this encounter.    Signed, Esmond Plants, M.D., Ph.D. 05/31/2021  [place], [place]

## 2021-05-31 NOTE — Patient Instructions (Signed)
Medication Instructions:  No changes  If you need a refill on your cardiac medications before your next appointment, please call your pharmacy.   Lab work: No new labs needed  Testing/Procedures: No new testing needed  Follow-Up: At [facility], you and your health needs are our priority.  As part of our continuing mission to provide you with exceptional heart care, we have created designated Provider Care Teams.  These Care Teams include your primary Cardiologist (physician) and Advanced Practice Providers (APPs -  Physician Assistants and Nurse Practitioners) who all work together to provide you with the care you need, when you need it.  You will need a follow up appointment in 12 months  Providers on your designated Care Team:   Christopher Berge, NP Ryan Dunn, PA-C Cadence Furth, PA-C  COVID-19 Vaccine Information can be found at: [name] For questions related to vaccine distribution or appointments, please email [email or web address] or call [phone].   

## 2021-07-02 ENCOUNTER — Other Ambulatory Visit: Payer: Self-pay | Admitting: Cardiovascular Disease

## 2021-08-18 ENCOUNTER — Other Ambulatory Visit: Payer: Self-pay | Admitting: Cardiovascular Disease

## 2021-10-17 ENCOUNTER — Other Ambulatory Visit: Payer: Self-pay | Admitting: Cardiovascular Disease

## 2022-01-18 ENCOUNTER — Other Ambulatory Visit: Payer: Self-pay | Admitting: Cardiovascular Disease

## 2022-04-12 ENCOUNTER — Other Ambulatory Visit: Payer: Self-pay

## 2022-04-12 ENCOUNTER — Telehealth: Payer: Self-pay | Admitting: Cardiovascular Disease

## 2022-04-12 ENCOUNTER — Emergency Department: Payer: PPO

## 2022-04-12 ENCOUNTER — Inpatient Hospital Stay
Admission: EM | Admit: 2022-04-12 | Discharge: 2022-04-15 | DRG: 322 | Disposition: A | Discharge status: Home / Self Care | Payer: PPO | Attending: Internal Medicine | Admitting: Internal Medicine

## 2022-04-12 DIAGNOSIS — R079 Chest pain, unspecified: Secondary | ICD-10-CM | POA: Diagnosis present

## 2022-04-12 DIAGNOSIS — E785 Hyperlipidemia, unspecified: Secondary | ICD-10-CM | POA: Diagnosis present

## 2022-04-12 DIAGNOSIS — Y831 Surgical operation with implant of artificial internal device as the cause of abnormal reaction of the patient, or of later complication, without mention of misadventure at the time of the procedure: Secondary | ICD-10-CM | POA: Diagnosis present

## 2022-04-12 DIAGNOSIS — I739 Peripheral vascular disease, unspecified: Secondary | ICD-10-CM | POA: Diagnosis present

## 2022-04-12 DIAGNOSIS — I4719 Other supraventricular tachycardia: Secondary | ICD-10-CM | POA: Diagnosis present

## 2022-04-12 DIAGNOSIS — Z87891 Personal history of nicotine dependence: Secondary | ICD-10-CM

## 2022-04-12 DIAGNOSIS — I10 Essential (primary) hypertension: Secondary | ICD-10-CM | POA: Diagnosis present

## 2022-04-12 DIAGNOSIS — Z79899 Other long term (current) drug therapy: Secondary | ICD-10-CM

## 2022-04-12 DIAGNOSIS — U099 Post covid-19 condition, unspecified: Secondary | ICD-10-CM | POA: Diagnosis present

## 2022-04-12 DIAGNOSIS — F03A0 Unspecified dementia, mild, without behavioral disturbance, psychotic disturbance, mood disturbance, and anxiety: Secondary | ICD-10-CM

## 2022-04-12 DIAGNOSIS — R911 Solitary pulmonary nodule: Secondary | ICD-10-CM | POA: Diagnosis present

## 2022-04-12 DIAGNOSIS — Z951 Presence of aortocoronary bypass graft: Secondary | ICD-10-CM

## 2022-04-12 DIAGNOSIS — Z955 Presence of coronary angioplasty implant and graft: Secondary | ICD-10-CM

## 2022-04-12 DIAGNOSIS — I214 Non-ST elevation (NSTEMI) myocardial infarction: Secondary | ICD-10-CM | POA: Diagnosis not present

## 2022-04-12 DIAGNOSIS — Z8616 Personal history of COVID-19: Secondary | ICD-10-CM | POA: Diagnosis present

## 2022-04-12 DIAGNOSIS — E1151 Type 2 diabetes mellitus with diabetic peripheral angiopathy without gangrene: Secondary | ICD-10-CM | POA: Diagnosis present

## 2022-04-12 DIAGNOSIS — I6523 Occlusion and stenosis of bilateral carotid arteries: Secondary | ICD-10-CM | POA: Diagnosis present

## 2022-04-12 DIAGNOSIS — I252 Old myocardial infarction: Secondary | ICD-10-CM

## 2022-04-12 DIAGNOSIS — T82855A Stenosis of coronary artery stent, initial encounter: Secondary | ICD-10-CM | POA: Diagnosis present

## 2022-04-12 DIAGNOSIS — Z8673 Personal history of transient ischemic attack (TIA), and cerebral infarction without residual deficits: Secondary | ICD-10-CM

## 2022-04-12 DIAGNOSIS — I2510 Atherosclerotic heart disease of native coronary artery without angina pectoris: Secondary | ICD-10-CM | POA: Diagnosis present

## 2022-04-12 LAB — APTT: aPTT: 109 seconds — ABNORMAL HIGH (ref 24–36)

## 2022-04-12 LAB — CBC
HCT: 41.7 % (ref 39.0–52.0)
Hemoglobin: 13.4 g/dL (ref 13.0–17.0)
MCH: 29.9 pg (ref 26.0–34.0)
MCHC: 32.1 g/dL (ref 30.0–36.0)
MCV: 93.1 fL (ref 80.0–100.0)
Platelets: 293 10*3/uL (ref 150–400)
RBC: 4.48 MIL/uL (ref 4.22–5.81)
RDW: 12.9 % (ref 11.5–15.5)
WBC: 9 10*3/uL (ref 4.0–10.5)
nRBC: 0 % (ref 0.0–0.2)

## 2022-04-12 LAB — COMPREHENSIVE METABOLIC PANEL
ALT: 17 U/L (ref 0–44)
AST: 24 U/L (ref 15–41)
Albumin: 4 g/dL (ref 3.5–5.0)
Alkaline Phosphatase: 72 U/L (ref 38–126)
Anion gap: 9 (ref 5–15)
BUN: 16 mg/dL (ref 8–23)
CO2: 25 mmol/L (ref 22–32)
Calcium: 9.2 mg/dL (ref 8.9–10.3)
Chloride: 102 mmol/L (ref 98–111)
Creatinine, Ser: 1.05 mg/dL (ref 0.61–1.24)
GFR, Estimated: >60 mL/min (ref >60)
Glucose, Bld: 135 mg/dL — ABNORMAL HIGH (ref 70–99)
Potassium: 4.7 mmol/L (ref 3.5–5.1)
Sodium: 136 mmol/L (ref 135–145)
Total Bilirubin: 0.4 mg/dL (ref 0.3–1.2)
Total Protein: 7.6 g/dL (ref 6.5–8.1)

## 2022-04-12 LAB — TROPONIN I (HIGH SENSITIVITY)
Troponin I (High Sensitivity): 686 ng/L (ref <18)
Troponin I (High Sensitivity): 765 ng/L (ref <18)

## 2022-04-12 MED ORDER — HEPARIN (PORCINE) 25000 UT/250ML-% IV SOLN
1050.0000 [IU]/h | INTRAVENOUS | Status: DC
  Administered 2022-04-12: 900 [IU]/h via INTRAVENOUS
  Filled 2022-04-12: qty 250

## 2022-04-12 MED ORDER — HEPARIN SODIUM (PORCINE) 5000 UNIT/ML IJ SOLN
4000.0000 [IU] | Freq: Once | INTRAMUSCULAR | Status: AC
  Administered 2022-04-12: 4000 [IU] via INTRAVENOUS
  Filled 2022-04-12: qty 1

## 2022-04-12 MED ORDER — IOHEXOL 350 MG/ML SOLN
75.0000 mL | Freq: Once | INTRAVENOUS | Status: AC | PRN
  Administered 2022-04-12: 75 mL via INTRAVENOUS

## 2022-04-12 NOTE — ED Notes (Signed)
Jacelyn Grip, MD and primary RN aware of critical trop of [phone].

## 2022-04-12 NOTE — ED Triage Notes (Signed)
Pt has been having chest pain intermittently over the past week, pt has a hx of mi and stents, pt has had elevated bp at home, pt took 4 baby asa pta

## 2022-04-12 NOTE — Consult Note (Signed)
ANTICOAGULATION CONSULT NOTE - Initial Consult  Pharmacy Consult for heparin infusion Indication: chest pain/ACS  No Known Allergies  Patient Measurements: Height: '5\' 7"'$  (170.2 cm) Weight: 74.8 kg (165 lb) IBW/kg (Calculated) : 66.1 Heparin Dosing Weight: 74.8 kg  Vital Signs: Temp: 97.6 F (36.4 C) (01/10 1726) Temp Source: Oral (01/10 1726) BP: 189/79 (01/10 1930) Pulse Rate: 74 (01/10 1930)  Labs: Recent Labs    04/12/22 1730  HGB 13.4  HCT 41.7  PLT 293  CREATININE 1.05  TROPONINIHS 686*    Estimated Creatinine Clearance: 51.6 mL/min (by C-G formula based on SCr of 1.05 mg/dL).   Medical History: Past Medical History:  Diagnosis Date   Arthritic-like pain    CAD (coronary artery disease)    a. apical MI 5/07 with LHC showing 50% pCFX, 30% pRCA, 99% pLAD, 99% mLAD, 75% dLAD (cypher DES x3 to LAD); b. ETT-myoview (12/08) 81% MPHR,  EF 64%, partially reversible inferoapical perfusion defect similar to prior study 12/07; c. 04/2016 MV: EF 59%, fixed anteroapical defect, no ischemia->Low risk.   Carotid arterial disease ([place])    a. 05/2016 Carotid U/S: 30-40% bilat dzs, f/u in 2 yrs.   CVA (cerebral vascular accident) ([place])    a. 07/2010 - h/o CVA/TIA.   Diet-controlled type 2 diabetes mellitus ([facility])    HLD (hyperlipidemia)    HTN (hypertension)    PAT (paroxysmal atrial tachycardia) ([place])    a. 06/2016 Event monitor:  rare episodes of atrial tachycardia, longest 7 beats. No afib.    Medications:  PTA: Clopidogrel '75mg'$  every morning with breakfast.  Patient reports taking 4 asa '81mg'$  tablets at home (total '324mg'$ ) today Inpatient: heparin infusion (1/10 > )  Allergies: NKDA  Assessment: 82 year old male with history of HLD, HTN, CAD, and T2DM presents to ED with complaints of chest pain. Troponins elevated at 686 ng/L. Pharmacy consulted for management of heparin infusion in the setting of ACS/STEMI  Date Time aPTT/HL Rate/Comment   Goal of Therapy:  Heparin  level 0.3-0.7 units/ml Monitor platelets by anticoagulation protocol: Yes   Plan:  Order STAT aPTT  Give 4000 units bolus x1; then start heparin infusion at 900 units/hr Check anti-Xa level in 8 hours and daily once consecutively therapeutic. Continue to monitor H&H and platelets daily while on heparin gtt.   Terry Pharmacist 04/12/2022 8:15 PM

## 2022-04-12 NOTE — Telephone Encounter (Signed)
Pt c/o of Chest Pain: STAT if CP now or developed within 24 hours  1. Are you having CP right now? No; took 3 baby aspirin  2. Are you experiencing any other symptoms (ex. SOB, nausea, vomiting, sweating)? BP is elevated  3. How long have you been experiencing CP? This afternoon  4. Is your CP continuous or coming and going? continuous  5. Have you taken Nitroglycerin? No  ?

## 2022-04-12 NOTE — ED Notes (Signed)
Pt to CT

## 2022-04-12 NOTE — Telephone Encounter (Signed)
Spoke with patients wife and she reports her husband is having some chest pain. It has been continuous and blood pressures are elevated. 186/83 was one reading she provided. Advised that he needs to proceed to the ED for further work up. She verbalized understanding with no further questions at this time.

## 2022-04-12 NOTE — ED Provider Notes (Signed)
[facility] Provider Note  Patient Contact: 7:57 PM (approximate)   History   Chest Pain   HPI  Raymond Sampson is a 82 y.o. male who presents the emergency department complaining of chest pain.  Patient has a history of previous MIs, has 3 stents in his heart.  Last MI was 10 years ago.  Last night patient had some sharp pain.  Patient's wife stated that initially she thought it was indigestion, however the patient and stated that it was different than his typical indigestion.  The wife recommended he be seen last night the patient refused.  He states that it was better when he laid down and slept through the night without pain.  Patient woke up this morning with no chest pain, however midmorning he started to have central chest pain that has persisted until he arrived to the emergency department.  Patient received aspirin directly prior to arrival administered by the wife.  He states that the pain is subsiding since the aspirin administration.  He has no shortness of breath or cough.  He did have COVID roughly 10 days ago and still has the cough.  There is no GI complaints.  Pain did not radiate to the back or down the left arm.  Was not described as necessarily pleuritic but did worsen with inspiration and certain movements.     Physical Exam   Triage Vital Signs: ED Triage Vitals  Enc Vitals Group     BP 04/12/22 1726 (!) 198/76     Pulse Rate 04/12/22 1726 68     Resp 04/12/22 1726 16     Temp 04/12/22 1726 97.6 F (36.4 C)     Temp Source 04/12/22 1726 Oral     SpO2 04/12/22 1726 98 %     Weight 04/12/22 1727 165 lb (74.8 kg)     Height 04/12/22 1727 '5\' 7"'$  (1.702 m)     Head Circumference --      Peak Flow --      Pain Score 04/12/22 1726 0     Pain Loc --      Pain Edu? --      Excl. in [place]? --     Most recent vital signs: Vitals:   04/12/22 2000 04/12/22 2050  BP: (!) 198/81 (!) 186/83  Pulse: 71 70  Resp: 16 16  Temp:    SpO2: 96% 95%      General: Alert and in no acute distress. ENT:      Ears:       Nose: No congestion/rhinnorhea.      Mouth/Throat: Mucous membranes are moist. Neck: No stridor. No cervical spine tenderness to palpation  Cardiovascular:  Good peripheral perfusion Respiratory: Normal respiratory effort without tachypnea or retractions. Lungs CTAB. Good air entry to the bases with no decreased or absent breath sounds Gastrointestinal: Bowel sounds 4 quadrants. Soft and nontender to palpation. No guarding or rigidity. No palpable masses. No distention. No CVA tenderness. Musculoskeletal: Full range of motion to all extremities.  Neurologic:  No gross focal neurologic deficits are appreciated.  Skin:   No rash noted Other:   ED Results / Procedures / Treatments   Labs (all labs ordered are listed, but only abnormal results are displayed) Labs Reviewed  COMPREHENSIVE METABOLIC PANEL - Abnormal; Notable for the following components:      Result Value   Glucose, Bld 135 (*)    All other components within normal limits  APTT - Abnormal;  Notable for the following components:   aPTT 109 (*)    All other components within normal limits  TROPONIN I (HIGH SENSITIVITY) - Abnormal; Notable for the following components:   Troponin I (High Sensitivity) 686 (*)    All other components within normal limits  TROPONIN I (HIGH SENSITIVITY) - Abnormal; Notable for the following components:   Troponin I (High Sensitivity) 765 (*)    All other components within normal limits  CBC  HEPARIN LEVEL (UNFRACTIONATED)     EKG  ED ECG REPORT I, Charline Bills Takeshi Teasdale,  personally viewed and interpreted this ECG.   Date: 04/12/2022  EKG Time: 1729 hrs.  Rate: 69 bpm  Rhythm: unchanged from previous tracings, normal sinus rhythm  Axis: Rightward axis  Intervals:none  ST&T Change: Nonspecific ST changes in lead II.  Nonspecific T wave changes in the lateral leads.  Compared to previous EKG changes in lead II are  new, changes in lateral leads are consistent with same.  Normal sinus rhythm.  No evidence of STEMI.  Largely unchanged from previous EKG from 05/23/2018 patient did have some ST changes in lead II I suspect this is more wandering baseline than true ST depression.  There is no gross ST elevation.    RADIOLOGY  I personally viewed, evaluated, and interpreted these images as part of my medical decision making, as well as reviewing the written report by the radiologist.  ED Provider Interpretation: No acute cardiopulmonary findings on chest x-ray.  CT scan of the chest reveals no evidence of PE.  Groundglass opacities consistent with recent COVID infection.  CT Angio Chest PE W and/or Wo Contrast  Result Date: 04/12/2022 CLINICAL DATA:  High probability for pulmonary embolism. Elevated blood pressure. EXAM: CT ANGIOGRAPHY CHEST WITH CONTRAST TECHNIQUE: Multidetector CT imaging of the chest was performed using the standard protocol during bolus administration of intravenous contrast. Multiplanar CT image reconstructions and MIPs were obtained to evaluate the vascular anatomy. RADIATION DOSE REDUCTION: This exam was performed according to the departmental dose-optimization program which includes automated exposure control, adjustment of the mA and/or kV according to patient size and/or use of iterative reconstruction technique. CONTRAST:  49m OMNIPAQUE IOHEXOL 350 MG/ML SOLN COMPARISON:  None Available. FINDINGS: Cardiovascular: Satisfactory opacification of the pulmonary arteries to the segmental level. No evidence of pulmonary embolism. Normal heart size. No pericardial effusion. There are atherosclerotic calcifications of the aorta and coronary arteries. Mediastinum/Nodes: There are mildly enlarged right hilar lymph nodes measuring up to 1 cm. There are nonenlarged mediastinal lymph nodes diffusely. There also nonenlarged left hilar lymph nodes. The esophagus and thyroid gland are within normal limits.  Lungs/Pleura: Mild emphysematous changes are present. There are scattered patchy ground-glass opacities throughout both lungs. There is a focal slightly nodular density in the left lung base measuring 2.5 by 1.1 cm. There is no pleural effusion or pneumothorax. Upper Abdomen: There is a low-density right adrenal nodule measuring 16 mm compatible with benign adenoma. Musculoskeletal: No acute fractures are seen. Cervical spinal fusion plate is partially visualized. Review of the MIP images confirms the above findings. IMPRESSION: 1. No evidence for pulmonary embolism. 2. Patchy ground-glass opacities throughout both lungs may represent infection/inflammation or mild edema. 3. Focal nodular density in the left lung base measuring 2.5 x 1.1 cm. Per Fleischner Society Guidelines, consider a non-contrast Chest CT at 3 months, a PET/CT, or tissue sampling. These guidelines do not apply to immunocompromised patients and patients with cancer. Follow up in patients with significant comorbidities as  clinically warranted. For lung cancer screening, adhere to Lung-RADS guidelines. Reference: Radiology. 2017; 284(1):228-43. Findings may represent focal atelectasis, infection, or neoplasm. 4. Mild right hilar lymphadenopathy. 5. Right adrenal adenoma. Aortic Atherosclerosis (ICD10-I70.0). Electronically Signed   By: Ronney Asters M.D.   On: 04/12/2022 20:58   DG Chest 2 View  Result Date: 04/12/2022 CLINICAL DATA:  Chest pain EXAM: CHEST - 2 VIEW COMPARISON:  Chest radiograph 06/05/2017 FINDINGS: The cardiomediastinal silhouette is normal There is no focal consolidation or pulmonary edema. There is no pleural effusion or pneumothorax There is no acute osseous abnormality. IMPRESSION: No radiographic evidence of acute cardiopulmonary process. Electronically Signed   By: Valetta Mole M.D.   On: 04/12/2022 18:11    PROCEDURES:  Critical Care performed: Yes, see critical care procedure note(s)  .Critical Care  Performed  by: Darletta Moll, PA-C Authorized by: Darletta Moll, PA-C   Critical care provider statement:    Critical care time (minutes):  49   Critical care time was exclusive of:  Separately billable procedures and treating other patients and teaching time   Critical care was necessary to treat or prevent imminent or life-threatening deterioration of the following conditions:  Cardiac failure   Critical care was time spent personally by me on the following activities:  Obtaining history from patient or surrogate, examination of patient, evaluation of patient's response to treatment, development of treatment plan with patient or surrogate, ordering and performing treatments and interventions, ordering and review of laboratory studies, ordering and review of radiographic studies, pulse oximetry, re-evaluation of patient's condition and review of old charts   I assumed direction of critical care for this patient from another provider in my specialty: no     Care discussed with: admitting provider      MEDICATIONS ORDERED IN ED: Medications  heparin ADULT infusion 100 units/mL (25000 units/26m) (900 Units/hr Intravenous New Bag/Given 04/12/22 2047)  heparin injection 4,000 Units (4,000 Units Intravenous Given 04/12/22 2017)  iohexol (OMNIPAQUE) 350 MG/ML injection 75 mL (75 mLs Intravenous Contrast Given 04/12/22 2030)     IMPRESSION / MDM / [facility]/ ED COURSE  I reviewed the triage vital signs and the nursing notes.                                 Differential diagnosis includes, but is not limited to, STEMI, NSTEMI, PE, bronchitis, pneumonia  The patient is on the cardiac monitor to evaluate for evidence of arrhythmia and/or significant heart rate changes.  Patient's presentation is most consistent with acute presentation with potential threat to life or bodily function.   Patient's diagnosis is consistent with NSTEMI.  Patient presents emergency department with  chest pain.  Pain began last night, did go away for time overnight, and then returned today.  Patient had some improvement after receiving aspirin from his wife prior to arrival.  On exam patient appears relatively stable.  EKG was reassuring with no evidence of STEMI.  Patient did have elevation of his troponin at 686 which has risen to 765.  Patient did have a recent COVID infection, and with his description of pain was also concern for possible PE.  CT scan of the chest reveals no evidence of pulmonary embolism.  Heparin is initiated.  Will admit to the hospitalist service at this time..Marland Kitchen    FINAL CLINICAL IMPRESSION(S) / ED DIAGNOSES   Final diagnoses:  NSTEMI (non-ST elevated  myocardial infarction) [facility])     Rx / DC Orders   ED Discharge Orders     None        Note:  This document was prepared using Dragon voice recognition software and may include unintentional dictation errors.   Brynda Peon 04/12/22 2143    Lucillie Garfinkel, MD 04/13/22 2330

## 2022-04-13 ENCOUNTER — Encounter: Payer: Self-pay | Admitting: Internal Medicine

## 2022-04-13 ENCOUNTER — Encounter
Admission: EM | Disposition: A | Discharge status: Home / Self Care | Payer: Self-pay | Source: Home / Self Care | Attending: Internal Medicine

## 2022-04-13 ENCOUNTER — Other Ambulatory Visit: Payer: Self-pay

## 2022-04-13 ENCOUNTER — Inpatient Hospital Stay (HOSPITAL_COMMUNITY)
Admit: 2022-04-13 | Discharge: 2022-04-13 | Disposition: A | Discharge status: Home / Self Care | Payer: PPO | Attending: Internal Medicine | Admitting: Internal Medicine

## 2022-04-13 ENCOUNTER — Other Ambulatory Visit: Payer: Self-pay | Admitting: Cardiology

## 2022-04-13 DIAGNOSIS — R079 Chest pain, unspecified: Secondary | ICD-10-CM

## 2022-04-13 DIAGNOSIS — Z955 Presence of coronary angioplasty implant and graft: Secondary | ICD-10-CM | POA: Diagnosis not present

## 2022-04-13 DIAGNOSIS — Z8673 Personal history of transient ischemic attack (TIA), and cerebral infarction without residual deficits: Secondary | ICD-10-CM | POA: Diagnosis not present

## 2022-04-13 DIAGNOSIS — I25110 Atherosclerotic heart disease of native coronary artery with unstable angina pectoris: Secondary | ICD-10-CM

## 2022-04-13 DIAGNOSIS — Z8616 Personal history of COVID-19: Secondary | ICD-10-CM

## 2022-04-13 DIAGNOSIS — E782 Mixed hyperlipidemia: Secondary | ICD-10-CM

## 2022-04-13 DIAGNOSIS — I2510 Atherosclerotic heart disease of native coronary artery without angina pectoris: Secondary | ICD-10-CM | POA: Diagnosis present

## 2022-04-13 DIAGNOSIS — E785 Hyperlipidemia, unspecified: Secondary | ICD-10-CM | POA: Diagnosis present

## 2022-04-13 DIAGNOSIS — I10 Essential (primary) hypertension: Secondary | ICD-10-CM | POA: Diagnosis present

## 2022-04-13 DIAGNOSIS — T82855A Stenosis of coronary artery stent, initial encounter: Secondary | ICD-10-CM | POA: Diagnosis present

## 2022-04-13 DIAGNOSIS — Z79899 Other long term (current) drug therapy: Secondary | ICD-10-CM | POA: Diagnosis not present

## 2022-04-13 DIAGNOSIS — U099 Post covid-19 condition, unspecified: Secondary | ICD-10-CM | POA: Diagnosis present

## 2022-04-13 DIAGNOSIS — I6523 Occlusion and stenosis of bilateral carotid arteries: Secondary | ICD-10-CM | POA: Diagnosis present

## 2022-04-13 DIAGNOSIS — R911 Solitary pulmonary nodule: Secondary | ICD-10-CM

## 2022-04-13 DIAGNOSIS — I214 Non-ST elevation (NSTEMI) myocardial infarction: Secondary | ICD-10-CM | POA: Diagnosis present

## 2022-04-13 DIAGNOSIS — I739 Peripheral vascular disease, unspecified: Secondary | ICD-10-CM | POA: Diagnosis present

## 2022-04-13 DIAGNOSIS — I252 Old myocardial infarction: Secondary | ICD-10-CM | POA: Diagnosis not present

## 2022-04-13 DIAGNOSIS — Z951 Presence of aortocoronary bypass graft: Secondary | ICD-10-CM | POA: Diagnosis not present

## 2022-04-13 DIAGNOSIS — E1151 Type 2 diabetes mellitus with diabetic peripheral angiopathy without gangrene: Secondary | ICD-10-CM | POA: Diagnosis present

## 2022-04-13 DIAGNOSIS — F03A0 Unspecified dementia, mild, without behavioral disturbance, psychotic disturbance, mood disturbance, and anxiety: Secondary | ICD-10-CM | POA: Diagnosis present

## 2022-04-13 DIAGNOSIS — Z87891 Personal history of nicotine dependence: Secondary | ICD-10-CM | POA: Diagnosis not present

## 2022-04-13 DIAGNOSIS — Y831 Surgical operation with implant of artificial internal device as the cause of abnormal reaction of the patient, or of later complication, without mention of misadventure at the time of the procedure: Secondary | ICD-10-CM | POA: Diagnosis present

## 2022-04-13 DIAGNOSIS — I4719 Other supraventricular tachycardia: Secondary | ICD-10-CM | POA: Diagnosis present

## 2022-04-13 HISTORY — PX: LEFT HEART CATH AND CORONARY ANGIOGRAPHY: CATH118249

## 2022-04-13 HISTORY — DX: Chest pain, unspecified: R07.9

## 2022-04-13 LAB — LIPID PANEL
Cholesterol: 205 mg/dL — ABNORMAL HIGH (ref 0–200)
HDL: 40 mg/dL — ABNORMAL LOW (ref >40)
LDL Cholesterol: 114 mg/dL — ABNORMAL HIGH (ref 0–99)
Total CHOL/HDL Ratio: 5.1 RATIO
Triglycerides: 257 mg/dL — ABNORMAL HIGH (ref <150)
VLDL: 51 mg/dL — ABNORMAL HIGH (ref 0–40)

## 2022-04-13 LAB — ECHOCARDIOGRAM COMPLETE
AR max vel: 2.78 cm2
AV Area VTI: 2.89 cm2
AV Area mean vel: 2.72 cm2
AV Mean grad: 4 mmHg
AV Peak grad: 6.5 mmHg
Ao pk vel: 1.27 m/s
Area-P 1/2: 3.46 cm2
Height: 67 in
S' Lateral: 2.6 cm
Weight: 2640 oz

## 2022-04-13 LAB — CBC
HCT: 39.8 % (ref 39.0–52.0)
HCT: 36.7 % — ABNORMAL LOW (ref 39.0–52.0)
Hemoglobin: 13.3 g/dL (ref 13.0–17.0)
Hemoglobin: 11.9 g/dL — ABNORMAL LOW (ref 13.0–17.0)
MCH: 30 pg (ref 26.0–34.0)
MCH: 29.2 pg (ref 26.0–34.0)
MCHC: 33.4 g/dL (ref 30.0–36.0)
MCHC: 32.4 g/dL (ref 30.0–36.0)
MCV: 89.8 fL (ref 80.0–100.0)
MCV: 90 fL (ref 80.0–100.0)
Platelets: 291 10*3/uL (ref 150–400)
Platelets: 257 10*3/uL (ref 150–400)
RBC: 4.43 MIL/uL (ref 4.22–5.81)
RBC: 4.08 MIL/uL — ABNORMAL LOW (ref 4.22–5.81)
RDW: 12.7 % (ref 11.5–15.5)
RDW: 12.9 % (ref 11.5–15.5)
WBC: 11.7 10*3/uL — ABNORMAL HIGH (ref 4.0–10.5)
WBC: 9.8 10*3/uL (ref 4.0–10.5)
nRBC: 0 % (ref 0.0–0.2)
nRBC: 0 % (ref 0.0–0.2)

## 2022-04-13 LAB — GLUCOSE, CAPILLARY: Glucose-Capillary: 175 mg/dL — ABNORMAL HIGH (ref 70–99)

## 2022-04-13 LAB — HEMOGLOBIN A1C
Hgb A1c MFr Bld: 6.8 % — ABNORMAL HIGH (ref 4.8–5.6)
Mean Plasma Glucose: 148.46 mg/dL

## 2022-04-13 LAB — BASIC METABOLIC PANEL
Anion gap: 13 (ref 5–15)
BUN: 15 mg/dL (ref 8–23)
CO2: 23 mmol/L (ref 22–32)
Calcium: 8.9 mg/dL (ref 8.9–10.3)
Chloride: 101 mmol/L (ref 98–111)
Creatinine, Ser: 0.98 mg/dL (ref 0.61–1.24)
GFR, Estimated: >60 mL/min (ref >60)
Glucose, Bld: 156 mg/dL — ABNORMAL HIGH (ref 70–99)
Potassium: 3.7 mmol/L (ref 3.5–5.1)
Sodium: 137 mmol/L (ref 135–145)

## 2022-04-13 LAB — CBG MONITORING, ED
Glucose-Capillary: 167 mg/dL — ABNORMAL HIGH (ref 70–99)
Glucose-Capillary: 246 mg/dL — ABNORMAL HIGH (ref 70–99)
Glucose-Capillary: 129 mg/dL — ABNORMAL HIGH (ref 70–99)
Glucose-Capillary: 165 mg/dL — ABNORMAL HIGH (ref 70–99)

## 2022-04-13 LAB — TROPONIN I (HIGH SENSITIVITY)
Troponin I (High Sensitivity): 1111 ng/L (ref <18)
Troponin I (High Sensitivity): 1422 ng/L (ref <18)

## 2022-04-13 LAB — HEPARIN LEVEL (UNFRACTIONATED): Heparin Unfractionated: 0.28 IU/mL — ABNORMAL LOW (ref 0.30–0.70)

## 2022-04-13 SURGERY — LEFT HEART CATH AND CORONARY ANGIOGRAPHY
Anesthesia: Moderate Sedation

## 2022-04-13 MED ORDER — VERAPAMIL HCL 2.5 MG/ML IV SOLN
INTRAVENOUS | Status: AC
  Filled 2022-04-13: qty 2

## 2022-04-13 MED ORDER — FENTANYL CITRATE (PF) 100 MCG/2ML IJ SOLN
INTRAMUSCULAR | Status: AC
  Filled 2022-04-13: qty 2

## 2022-04-13 MED ORDER — ASPIRIN 300 MG RE SUPP: 300.0000 mg | RECTAL | Status: AC

## 2022-04-13 MED ORDER — ACETAMINOPHEN 325 MG PO TABS: 650.0000 mg | ORAL_TABLET | ORAL | Status: DC | PRN

## 2022-04-13 MED ORDER — LABETALOL HCL 5 MG/ML IV SOLN
INTRAVENOUS | Status: AC
  Filled 2022-04-13: qty 4

## 2022-04-13 MED ORDER — HEPARIN SODIUM (PORCINE) 1000 UNIT/ML IJ SOLN
INTRAMUSCULAR | Status: DC | PRN
  Administered 2022-04-13: 4000 [IU] via INTRAVENOUS

## 2022-04-13 MED ORDER — IOHEXOL 300 MG/ML  SOLN
INTRAMUSCULAR | Status: DC | PRN
  Administered 2022-04-13: 52 mL

## 2022-04-13 MED ORDER — ASPIRIN 81 MG PO CHEW: 81.0000 mg | CHEWABLE_TABLET | ORAL | Status: DC

## 2022-04-13 MED ORDER — ASPIRIN 81 MG PO CHEW
324.0000 mg | CHEWABLE_TABLET | ORAL | Status: AC
  Administered 2022-04-13: 324 mg via ORAL
  Filled 2022-04-13: qty 4

## 2022-04-13 MED ORDER — MIDAZOLAM HCL 2 MG/2ML IJ SOLN
INTRAMUSCULAR | Status: AC
  Filled 2022-04-13: qty 2

## 2022-04-13 MED ORDER — HEPARIN (PORCINE) 25000 UT/250ML-% IV SOLN
1050.0000 [IU]/h | INTRAVENOUS | Status: DC
  Administered 2022-04-13 – 2022-04-14 (×2): 1050 [IU]/h via INTRAVENOUS
  Filled 2022-04-13: qty 250

## 2022-04-13 MED ORDER — METOPROLOL TARTRATE 25 MG PO TABS
25.0000 mg | ORAL_TABLET | Freq: Two times a day (BID) | ORAL | Status: DC
  Administered 2022-04-13 – 2022-04-15 (×6): 25 mg via ORAL
  Filled 2022-04-13 (×6): qty 1

## 2022-04-13 MED ORDER — INSULIN ASPART 100 UNIT/ML IJ SOLN
0.0000 [IU] | Freq: Three times a day (TID) | INTRAMUSCULAR | Status: DC
  Administered 2022-04-13: 8 [IU] via SUBCUTANEOUS
  Administered 2022-04-14: 5 [IU] via SUBCUTANEOUS
  Administered 2022-04-14 – 2022-04-15 (×3): 3 [IU] via SUBCUTANEOUS
  Filled 2022-04-13 (×5): qty 1

## 2022-04-13 MED ORDER — ASPIRIN 81 MG PO CHEW
CHEWABLE_TABLET | ORAL | Status: AC
  Filled 2022-04-13: qty 1

## 2022-04-13 MED ORDER — LABETALOL HCL 5 MG/ML IV SOLN
10.0000 mg | INTRAVENOUS | Status: AC | PRN
  Administered 2022-04-13: 10 mg via INTRAVENOUS

## 2022-04-13 MED ORDER — SODIUM CHLORIDE 0.9% FLUSH
3.0000 mL | Freq: Two times a day (BID) | INTRAVENOUS | Status: DC
  Administered 2022-04-13 – 2022-04-15 (×3): 3 mL via INTRAVENOUS

## 2022-04-13 MED ORDER — CLOPIDOGREL BISULFATE 75 MG PO TABS
75.0000 mg | ORAL_TABLET | Freq: Every day | ORAL | Status: DC
  Administered 2022-04-13 – 2022-04-15 (×3): 75 mg via ORAL
  Filled 2022-04-13 (×3): qty 1

## 2022-04-13 MED ORDER — LABETALOL HCL 5 MG/ML IV SOLN
20.0000 mg | INTRAVENOUS | Status: DC | PRN
  Administered 2022-04-13 (×3): 20 mg via INTRAVENOUS
  Filled 2022-04-13 (×3): qty 4

## 2022-04-13 MED ORDER — LISINOPRIL 20 MG PO TABS
20.0000 mg | ORAL_TABLET | Freq: Every day | ORAL | Status: DC
  Administered 2022-04-13 – 2022-04-15 (×3): 20 mg via ORAL
  Filled 2022-04-13: qty 2
  Filled 2022-04-13 (×2): qty 1

## 2022-04-13 MED ORDER — SODIUM CHLORIDE 0.9 % IV SOLN: 250.0000 mL | INTRAVENOUS | Status: DC | PRN

## 2022-04-13 MED ORDER — HEPARIN SODIUM (PORCINE) 1000 UNIT/ML IJ SOLN
INTRAMUSCULAR | Status: AC
  Filled 2022-04-13: qty 10

## 2022-04-13 MED ORDER — HYDRALAZINE HCL 20 MG/ML IJ SOLN
10.0000 mg | INTRAMUSCULAR | Status: DC | PRN
  Administered 2022-04-13 – 2022-04-14 (×2): 10 mg via INTRAVENOUS
  Filled 2022-04-13 (×2): qty 1

## 2022-04-13 MED ORDER — SODIUM CHLORIDE 0.9 % WEIGHT BASED INFUSION
1.0000 mL/kg/h | INTRAVENOUS | Status: DC
  Administered 2022-04-13: 1 mL/kg/h via INTRAVENOUS

## 2022-04-13 MED ORDER — SERTRALINE HCL 50 MG PO TABS
25.0000 mg | ORAL_TABLET | Freq: Every day | ORAL | Status: DC
  Administered 2022-04-13 – 2022-04-15 (×3): 25 mg via ORAL
  Filled 2022-04-13 (×3): qty 1

## 2022-04-13 MED ORDER — SODIUM CHLORIDE 0.9 % IV SOLN: INTRAVENOUS | Status: AC

## 2022-04-13 MED ORDER — INSULIN ASPART 100 UNIT/ML IJ SOLN: 0.0000 [IU] | Freq: Every day | INTRAMUSCULAR | Status: DC

## 2022-04-13 MED ORDER — SODIUM CHLORIDE 0.9% FLUSH: 3.0000 mL | INTRAVENOUS | Status: DC | PRN

## 2022-04-13 MED ORDER — ATORVASTATIN CALCIUM 80 MG PO TABS
80.0000 mg | ORAL_TABLET | Freq: Every day | ORAL | Status: DC
  Administered 2022-04-13 – 2022-04-15 (×3): 80 mg via ORAL
  Filled 2022-04-13 (×3): qty 1

## 2022-04-13 MED ORDER — FENTANYL CITRATE (PF) 100 MCG/2ML IJ SOLN
INTRAMUSCULAR | Status: DC | PRN
  Administered 2022-04-13: 25 ug via INTRAVENOUS

## 2022-04-13 MED ORDER — MIDAZOLAM HCL 2 MG/2ML IJ SOLN
INTRAMUSCULAR | Status: DC | PRN
  Administered 2022-04-13: 1 mg via INTRAVENOUS

## 2022-04-13 MED ORDER — NITROGLYCERIN 0.4 MG SL SUBL: 0.4000 mg | SUBLINGUAL_TABLET | SUBLINGUAL | Status: DC | PRN

## 2022-04-13 MED ORDER — HEPARIN (PORCINE) IN NACL 1000-0.9 UT/500ML-% IV SOLN
INTRAVENOUS | Status: AC
  Filled 2022-04-13: qty 1000

## 2022-04-13 MED ORDER — LABETALOL HCL 5 MG/ML IV SOLN
INTRAVENOUS | Status: DC | PRN
  Administered 2022-04-13: 10 mg via INTRAVENOUS

## 2022-04-13 MED ORDER — PANTOPRAZOLE SODIUM 20 MG PO TBEC
20.0000 mg | DELAYED_RELEASE_TABLET | Freq: Every day | ORAL | Status: DC
  Administered 2022-04-13 – 2022-04-15 (×3): 20 mg via ORAL
  Filled 2022-04-13 (×3): qty 1

## 2022-04-13 MED ORDER — ASPIRIN 81 MG PO TBEC
81.0000 mg | DELAYED_RELEASE_TABLET | Freq: Every day | ORAL | Status: DC
  Administered 2022-04-14 – 2022-04-15 (×2): 81 mg via ORAL
  Filled 2022-04-13 (×2): qty 1

## 2022-04-13 MED ORDER — HEPARIN (PORCINE) IN NACL 1000-0.9 UT/500ML-% IV SOLN
INTRAVENOUS | Status: DC | PRN
  Administered 2022-04-13 (×2): 500 mL

## 2022-04-13 MED ORDER — ONDANSETRON HCL 4 MG/2ML IJ SOLN: 4.0000 mg | Freq: Four times a day (QID) | INTRAMUSCULAR | Status: DC | PRN

## 2022-04-13 MED ORDER — VERAPAMIL HCL 2.5 MG/ML IV SOLN
INTRAVENOUS | Status: DC | PRN
  Administered 2022-04-13: 2.5 mg via INTRA_ARTERIAL

## 2022-04-13 SURGICAL SUPPLY — 12 items
CATH 5F 110X4 TIG (CATHETERS) IMPLANT
CATH INFINITI 5 FR JL3.5 (CATHETERS) IMPLANT
DEVICE RAD TR BAND REGULAR (VASCULAR PRODUCTS) IMPLANT
DRAPE BRACHIAL (DRAPES) IMPLANT
GLIDESHEATH SLEND SS 6F .021 (SHEATH) IMPLANT
GUIDEWIRE INQWIRE 1.5J.035X260 (WIRE) IMPLANT
INQWIRE 1.5J .035X260CM (WIRE) ×1 IMPLANT
PACK CARDIAC CATH (CUSTOM PROCEDURE TRAY) ×1 IMPLANT
PROTECTION STATION PRESSURIZED (MISCELLANEOUS) ×1 IMPLANT
SET ATX SIMPLICITY (MISCELLANEOUS) IMPLANT
STATION PROTECTION PRESSURIZED (MISCELLANEOUS) IMPLANT
WIRE HITORQ VERSACORE ST 145CM (WIRE) IMPLANT

## 2022-04-13 NOTE — Hospital Course (Addendum)
Raymond Sampson is a 82 y.o. male with history of CAD with MI 12/06/2005 status post stenting x 3 to the LAD, TIA/CVA in April 2012, atrial tachycardia, bilateral carotid artery disease, dementia, diabetes type 2, hypertension, hyperlipidemia, prior tobacco use who presented to the ED on 04/12/2022 for evaluation of chest pain.   Pain was notably worse with exertion, non-radiatin, substernal and mild to moderate in intensity.  ED evaluation was concerning for Non-STEMI with hs-troponin elevation with trend upwards (686 >> 765 >> 1111 >> 1422).  He was admitted to hospital, started on IV heparin and Cardiology consulted.   1/11 - cardiology plans for left heart cath this afternoon.  Pt denies active chest pain overnight or this AM.  1/12 - return to cath lab, intervention to RCA, LAD and at prior mid-LAD stent.  Remains chest pain free  1/13 - cardiology has cleared for discharge as requested by patient and family.  Plan for outpatient follow up to discussed staged PCI to Lcx.

## 2022-04-13 NOTE — H&P (View-Only) (Signed)
Cardiology Consultation   Patient ID: ZEKIEL TORIAN MRN: [id number]; DOB: [birth year]  Admit date: 04/12/2022 Date of Consult: 04/13/2022  PCP:  [name], MD   [facility] Cardiologist:  Ida Rogue, MD   {  Patient Profile:   Raymond Sampson is a 82 y.o. male with a hx of CAD with apical MI 12/06/2005 status post stenting x 3 to the LAD, TIA/CVA in April 2012, atrial tachycardia, bilateral carotid artery disease, dementia, diabetes type 2, hypertension, hyperlipidemia, prior tobacco use who is being seen 04/13/2022 for the evaluation of NSTEMI at the request of Dr. Arbutus Ped.  History of Present Illness:   Mr. Sagar is followed by Dr. Rockey Situ for the above cardiac history.  Patient had an MI in 2007 that was treated with PCI/DES x 3 to LAD.  He had a low risk MPI in 2013 was severely elevated blood pressure.  In 2014 TTE showed EF 60 to 78%, diastolic dysfunction.  He was seen in 2018 for palpitations and abnormal EKG with subsequent low risk Myoview Lexiscan.  Cardiac monitoring in 2019 showed sinus rhythm with 7 runs of atrial tachycardia, longest 7 beats.  No evidence of A-fib.  Carotid artery ultrasound in 12/22/2018 showed right and left ICA 1 to 39% stenosis.  Patient was last seen 05/23/2021 and was overall stable from a cardiac standpoint.  Patient presented to [facility] ED on 04/12/2022 for chest pain. He had chest pain Tuesday evening and some the next day, Wednesday.  Pain improved with laying down. His wife thought it was indigestion. Patient is moderately active. The patient reported recent COVID infection.   In the ER blood pressures are 198/76, pulse 68 bpm, respiratory rate 16, afebrile, normal O2.  Labs showed high-sensitivity troponin 530 051 2448. Other labs showed normal BMET . CBC 11.7. EKG showed normal sinus rhythm, RAD, 69 bpm, nonspecific ST and T wave changes.  Patient was started on IV heparin and admitted for further workup.  Past  Medical History:  Diagnosis Date   Arthritic-like pain    CAD (coronary artery disease)    a. apical MI 5/07 with LHC showing 50% pCFX, 30% pRCA, 99% pLAD, 99% mLAD, 75% dLAD (cypher DES x3 to LAD); b. ETT-myoview (12/08) 81% MPHR,  EF 64%, partially reversible inferoapical perfusion defect similar to prior study 12/07; c. 04/2016 MV: EF 59%, fixed anteroapical defect, no ischemia->Low risk.   Carotid arterial disease ([place])    a. 05/2016 Carotid U/S: 30-40% bilat dzs, f/u in 2 yrs.   CVA (cerebral vascular accident) ([place])    a. 07/2010 - h/o CVA/TIA.   Diet-controlled type 2 diabetes mellitus (HCC)    HLD (hyperlipidemia)    HTN (hypertension)    PAT (paroxysmal atrial tachycardia)    a. 06/2016 Event monitor:  rare episodes of atrial tachycardia, longest 7 beats. No afib.    Past Surgical History:  Procedure Laterality Date   BACK SURGERY     CARDIAC CATHETERIZATION     CORONARY ANGIOPLASTY WITH STENT PLACEMENT     apical MI 5/07 with LHC showing 50% pCFX, 30% pRCA, 99% pLAD, 9%% mLAD, 75% dLAD. cypher DES x3 to LAD. ETT-myoview (12/08) 81% MPHR, 8'1, EF 64%, partially reversible inferoapical perfusion defect similar to prior study 12/07     Home Medications:  Prior to Admission medications   Medication Sig Start Date End Date Taking? Authorizing Provider  aspirin 81 MG tablet Take 81 mg by mouth daily.   Yes [provider]  clopidogrel (PLAVIX) 75 MG tablet TAKE 1 TABLET BY MOUTH DAILY WITH BREAKFAST. 07/04/21  Yes Gollan, Kathlene November, MD  cyanocobalamin 1000 MCG tablet Take 1,000 mcg by mouth daily.   Yes [provider]  lisinopril (ZESTRIL) 20 MG tablet TAKE 1 TABLET BY MOUTH EVERY DAY 07/04/21  Yes Gollan, Kathlene November, MD  metFORMIN (GLUCOPHAGE) 1000 MG tablet Take 1,000 mg by mouth 2 (two) times daily with a meal.   Yes [provider]  metoprolol tartrate (LOPRESSOR) 25 MG tablet TAKE 1 TABLET BY MOUTH TWICE A DAY 10/18/21  Yes Gollan, Kathlene November, MD  sertraline  (ZOLOFT) 25 MG tablet Take 1 tablet (25 mg total) by mouth daily. 04/26/16  Yes Gollan, Kathlene November, MD  simvastatin (ZOCOR) 20 MG tablet TAKE 1 TABLET BY MOUTH EVERY DAY 08/18/21  Yes Gollan, Kathlene November, MD  sitaGLIPtin (JANUVIA) 25 MG tablet Take 1 tablet by mouth daily. 02/21/21  Yes [provider]  amLODipine (NORVASC) 5 MG tablet TAKE 1 TABLET (5 MG TOTAL) BY MOUTH DAILY. Patient not taking: Reported on 04/13/2022 10/18/21   Minna Merritts, MD    Inpatient Medications: Scheduled Meds:  [START ON 04/14/2022] aspirin  81 mg Oral Pre-Cath   [START ON 04/14/2022] aspirin EC  81 mg Oral Daily   atorvastatin  80 mg Oral Daily   clopidogrel  75 mg Oral Q breakfast   insulin aspart  0-15 Units Subcutaneous TID WC   insulin aspart  0-5 Units Subcutaneous QHS   lisinopril  20 mg Oral Daily   metoprolol tartrate  25 mg Oral BID   pantoprazole  20 mg Oral Daily   sertraline  25 mg Oral Daily   Continuous Infusions:  sodium chloride     heparin 1,050 Units/hr (04/13/22 1129)   PRN Meds: acetaminophen, hydrALAZINE, labetalol, nitroGLYCERIN, ondansetron (ZOFRAN) IV  Allergies:   No Known Allergies  Social History:   Social History   Socioeconomic History   Marital status: Married    Spouse name: Not on file   Number of children: Not on file   Years of education: Not on file   Highest education level: Not on file  Occupational History   Occupation: retired  Tobacco Use   Smoking status: Former    Packs/day: 1.00    Years: 25.00    Total pack years: 25.00    Types: Cigarettes    Quit date: 04/17/1985    Years since quitting: 37.0   Smokeless tobacco: Never  Vaping Use   Vaping Use: Never used  Substance and Sexual Activity   Alcohol use: No   Drug use: No   Sexual activity: Not on file  Other Topics Concern   Not on file  Social History Narrative   Part time work at [employer]. Does not regularly exercise.    Social Determinants of Health   Financial Resource  Strain: Not on file  Food Insecurity: No Food Insecurity (04/13/2022)   Hunger Vital Sign    Worried About Running Out of Food in the Last Year: Never true    Ran Out of Food in the Last Year: Never true  Transportation Needs: No Transportation Needs (04/13/2022)   PRAPARE - Hydrologist (Medical): No    Lack of Transportation (Non-Medical): No  Physical Activity: Not on file  Stress: Not on file  Social Connections: Not on file  Intimate Partner Violence: Not At Risk (04/13/2022)   Humiliation, Afraid, Rape, and Kick questionnaire  Fear of Current or Ex-Partner: No    Emotionally Abused: No    Physically Abused: No    Sexually Abused: No    Family History:    Family History  Family history unknown: Yes     ROS:  Please see the history of present illness.   All other ROS reviewed and negative.     Physical Exam/Data:   Vitals:   04/13/22 0700 04/13/22 1009 04/13/22 1030 04/13/22 1100  BP: (!) 148/84 (!) 155/69 (!) 152/81 (!) 156/76  Pulse: 70  70 69  Resp: '11  12 11  '$ Temp:   98.1 F (36.7 C)   TempSrc:      SpO2: 95%  93% 94%  Weight:      Height:        Intake/Output Summary (Last 24 hours) at 04/13/2022 1133 Last data filed at 04/13/2022 0508 Gross per 24 hour  Intake --  Output 400 ml  Net -400 ml      04/12/2022    5:27 PM 05/31/2021   11:54 AM 08/19/2020    7:54 AM  Last 3 Weights  Weight (lbs) 165 lb 159 lb 2 oz 160 lb  Weight (kg) 74.844 kg 72.179 kg 72.576 kg     Body mass index is 25.84 kg/m.  General:  Well nourished, well developed, in no acute distress HEENT: normal Neck: no JVD Vascular: No carotid bruits; Distal pulses 2+ bilaterally Cardiac:  normal S1, S2; RRR; no murmur  Lungs:  clear to auscultation bilaterally, no wheezing, rhonchi or rales  Abd: soft, nontender, no hepatomegaly  Ext: no edema Musculoskeletal:  No deformities, BUE and BLE strength normal and equal Skin: warm and dry  Neuro:  CNs 2-12  intact, no focal abnormalities noted Psych:  Normal affect   EKG:  The EKG was personally reviewed and demonstrates:  NSR, nonspecific ST/T wave changes Telemetry:  Telemetry was personally reviewed and demonstrates:  NSR HR 60-70s  Relevant CV Studies:  Echo ordered  Laboratory Data:  High Sensitivity Troponin:   Recent Labs  Lab 04/12/22 1730 04/12/22 1918 04/13/22 0430 04/13/22 1014  TROPONINIHS 686* 765* 1,111* 1,422*     Chemistry Recent Labs  Lab 04/12/22 1730 04/13/22 0430  NA 136 137  K 4.7 3.7  CL 102 101  CO2 25 23  GLUCOSE 135* 156*  BUN 16 15  CREATININE 1.05 0.98  CALCIUM 9.2 8.9  GFRNONAA >60 >60  ANIONGAP 9 13    Recent Labs  Lab 04/12/22 1730  PROT 7.6  ALBUMIN 4.0  AST 24  ALT 17  ALKPHOS 72  BILITOT 0.4   Lipids  Recent Labs  Lab 04/13/22 0430  CHOL 205*  TRIG 257*  HDL 40*  LDLCALC 114*  CHOLHDL 5.1    Hematology Recent Labs  Lab 04/12/22 1730 04/13/22 0430  WBC 9.0 11.7*  RBC 4.48 4.43  HGB 13.4 13.3  HCT 41.7 39.8  MCV 93.1 89.8  MCH 29.9 30.0  MCHC 32.1 33.4  RDW 12.9 12.7  PLT 293 291   Thyroid No results for input(s): "TSH", "FREET4" in the last 168 hours.  BNPNo results for input(s): "BNP", "PROBNP" in the last 168 hours.  DDimer No results for input(s): "DDIMER" in the last 168 hours.   Radiology/Studies:  CT Angio Chest PE W and/or Wo Contrast  Result Date: 04/12/2022 CLINICAL DATA:  High probability for pulmonary embolism. Elevated blood pressure. EXAM: CT ANGIOGRAPHY CHEST WITH CONTRAST TECHNIQUE: Multidetector CT imaging of the chest  was performed using the standard protocol during bolus administration of intravenous contrast. Multiplanar CT image reconstructions and MIPs were obtained to evaluate the vascular anatomy. RADIATION DOSE REDUCTION: This exam was performed according to the departmental dose-optimization program which includes automated exposure control, adjustment of the mA and/or kV according to  patient size and/or use of iterative reconstruction technique. CONTRAST:  37m OMNIPAQUE IOHEXOL 350 MG/ML SOLN COMPARISON:  None Available. FINDINGS: Cardiovascular: Satisfactory opacification of the pulmonary arteries to the segmental level. No evidence of pulmonary embolism. Normal heart size. No pericardial effusion. There are atherosclerotic calcifications of the aorta and coronary arteries. Mediastinum/Nodes: There are mildly enlarged right hilar lymph nodes measuring up to 1 cm. There are nonenlarged mediastinal lymph nodes diffusely. There also nonenlarged left hilar lymph nodes. The esophagus and thyroid gland are within normal limits. Lungs/Pleura: Mild emphysematous changes are present. There are scattered patchy ground-glass opacities throughout both lungs. There is a focal slightly nodular density in the left lung base measuring 2.5 by 1.1 cm. There is no pleural effusion or pneumothorax. Upper Abdomen: There is a low-density right adrenal nodule measuring 16 mm compatible with benign adenoma. Musculoskeletal: No acute fractures are seen. Cervical spinal fusion plate is partially visualized. Review of the MIP images confirms the above findings. IMPRESSION: 1. No evidence for pulmonary embolism. 2. Patchy ground-glass opacities throughout both lungs may represent infection/inflammation or mild edema. 3. Focal nodular density in the left lung base measuring 2.5 x 1.1 cm. Per Fleischner Society Guidelines, consider a non-contrast Chest CT at 3 months, a PET/CT, or tissue sampling. These guidelines do not apply to immunocompromised patients and patients with cancer. Follow up in patients with significant comorbidities as clinically warranted. For lung cancer screening, adhere to Lung-RADS guidelines. Reference: Radiology. 2017; 284(1):228-43. Findings may represent focal atelectasis, infection, or neoplasm. 4. Mild right hilar lymphadenopathy. 5. Right adrenal adenoma. Aortic Atherosclerosis (ICD10-I70.0).  Electronically Signed   By: ARonney AstersM.D.   On: 04/12/2022 20:58   DG Chest 2 View  Result Date: 04/12/2022 CLINICAL DATA:  Chest pain EXAM: CHEST - 2 VIEW COMPARISON:  Chest radiograph 06/05/2017 FINDINGS: The cardiomediastinal silhouette is normal There is no focal consolidation or pulmonary edema. There is no pleural effusion or pneumothorax There is no acute osseous abnormality. IMPRESSION: No radiographic evidence of acute cardiopulmonary process. Electronically Signed   By: PValetta MoleM.D.   On: 04/12/2022 18:11     Assessment and Plan:   Non-STEMI CAD s/p remote stenting in 2007 - presented with sharp chest pain improved with ASA - HS trop up to 1,100, continue to trend - IV heparin - echo  ordered - patient ate this morning around 7:30am. He is now NPO - continue PTA ASA '81mg'$  daily and Plavix '75mg'$  daily - PTA Lopressor '25mg'$  BID, simvastatin '20mg'$  daily, lisinopril '20mg'$  daily - he will need LHC Risks and benefits of cardiac catheterization have been discussed with the patient.  These include bleeding, infection, kidney damage, stroke, heart attack, death.  The patient understands these risks and is willing to proceed.  H/o COVID-19 - per IM  Carotid artery disease - Carotid [place]ain 2020 showed 1-39% disease - continue Apsirin and statin therapy  HTN - BP severely elevated on admission - PTA amlodipine '5mg'$  daily, lisinopril '20mg'$  daily, Lopressor '25mg'$  BID. Amlodipine held on admission - BP still elevated  Atrial tachycardia - continue with telemetry  HLD - TG 257, HDL 40, LDL 114 - PTA simvastatin>changed to Lipitor '80mg'$  daily  For questions or updates, please contact [place] Please consult [email or web address] for contact info under    Signed, Chailyn Racette Ninfa Meeker, PA-C  04/13/2022 11:33 AM

## 2022-04-13 NOTE — ED Notes (Signed)
Patient b/p mid 140's to 150's after both PRN meds given. NP [place] notified.

## 2022-04-13 NOTE — Brief Op Note (Addendum)
BRIEF CARDIAC CATHETERIZATION NOTE  04/13/2022 5:34 PM  NAME: ALERIC FROELICH   MRN: [id number]  PCP:  [name], MD [place] Cardiologist:  Ida Rogue, MD    PROCEDURE:  Procedure(s): LEFT HEART CATH AND CORONARY ANGIOGRAPHY (N/A)  SURGEON:  Surgeon(s) and Role:    * [name], MD - Primary  PATIENT:  Raymond Sampson is a 82 y.o. male with a hx of CAD with apical MI 12/06/2005 status post stenting x 3 to the LAD, TIA/CVA in April 2012, atrial tachycardia, bilateral carotid artery disease, dementia, diabetes type 2, hypertension, hyperlipidemia, prior tobacco use who Was seen in consultation by Dr. Rockey Situ on 04/13/2022 for the evaluation of NSTEMI at the request of Dr. Arbutus Ped.  Echocardiogram shows relatively preserved EF.  With positive troponins, he is referred for cardiac catheterization and possible PCI.  PRE-OPERATIVE DIAGNOSIS:  nstemi  POST-OPERATIVE DIAGNOSIS:  Severe multivessel CAD: Small caliber codominant RCA with 70% stenosis tapering to a 95% focal lesion.  Vessel gives rise to a small caliber Large LAD with several small diagonal branches.  There is a proximal 80% calcified focal lesion (concentric).  Roughly 20 mm downstream there is a very long stented segment over 50 mm that is diffusely diseased, in-stent ISR up to 75%.  At the distal edge of the stent there is a 95% lesion involving the distal edge and into the native vessel.  The distal LAD wraps the apex. Large dominant/codominant LCx with proximal 60% stenosis prior to giving off OM1 which has ostial 70% lesion. Beyond OM1, the mid LCx has 80% stenosis prior to giving off the AV groove LCx.  The follow on LCx-OM 2 has 70% stenosis.  The AV groove beyond OM2 is relatively free of disease and gives rise to 2 major posterolateral branches as well as a possible small PDA. Normal EF by echo with no regional wall motion normality.  Time Out: Verified patient identification, verified  procedure, site/side was marked, verified correct patient position, special equipment/implants available, medications/allergies/relevent history reviewed, required imaging and test results available. Performed.  Access:  RIGHT Radial Artery: 6 Fr sheath -- Seldinger technique using Micropuncture Kit -- Direct ultrasound guidance used.  Permanent image obtained and placed on chart. -- 10 mL radial cocktail IA; 4000 Units IV Heparin  Left Heart Catheterization: 5Fr TIG 4.0 Catheter was advanced or exchanged over a J-wire under direct fluoroscopic guidance into the ascending aorta; the catheter was used to engage first the RCA (using wire in place to torque the catheter)-cineangiography performed..  The catheter then was crossed into the LV for hemodynamics.  With pullback monitoring, the catheter was then engaged the left coronary artery and therefore left coronary artery cineangiography performed. * LV Hemodynamics (NoLV Gram): TIG 4.0 catheter * Left & Right Coronary Artery Cineangiography: TIG 4.0 Catheter   Review of initial angiography revealed: Severe multivessel disease as noted above.  Based on these findings, decision made to pause at this point, stop the procedure and allow for further discussion with patient, family and Heart Team.   Upon completion of Angiogaphy, the catheter was removed completely out of the body over a wire, without complication.  Radial sheath removed in the Cardiac Catheterization lab with TR Band placed for hemostasis.  TR Band: 1720  Hours; 12 mL air  MEDICATIONS SQ Lidocaine 3 mL Radial Cocktail: 3 mg Verapmil in 10 mL NS Heparin: 4000 Units  EBL:  < 50 mL   COUNTS:  YES  DICTATION: .Note written in [place]:  Continue with plan to admit overnight.  Will restart IV heparin 2 hours after sheath removal. \ Will need to meet with patient and family as well as have a heart team discussion with interventional cardiology and CVTS to determine best  treatment options.  If he were to need PCI, may be best for him to be [facility] to [facility] as the procedure would like to be prolonged and difficult.Potential needs to be noted to the patient's mental status, and baseline functional status.   PATIENT DISPOSITION:  PACU - hemodynamically stable.    Glenetta Hew, MD

## 2022-04-13 NOTE — Progress Notes (Signed)
Heparin gtt restarted 2 hours post TR band removal per order. Right radial site at Level 0, see flowsheet.

## 2022-04-13 NOTE — Consult Note (Signed)
ANTICOAGULATION CONSULT NOTE - Initial Consult  Pharmacy Consult for heparin infusion Indication: chest pain/ACS  No Known Allergies  Patient Measurements: Height: '5\' 7"'$  (170.2 cm) Weight: 74.8 kg (165 lb) IBW/kg (Calculated) : 66.1 Heparin Dosing Weight: 74.8 kg  Vital Signs: Temp: 98 F (36.7 C) (01/11 1415) BP: 173/77 (01/11 1900) Pulse Rate: 67 (01/11 1900)  Labs: Recent Labs    04/12/22 1730 04/12/22 1918 04/12/22 2047 04/13/22 0430 04/13/22 1014  HGB 13.4  --   --  13.3  --   HCT 41.7  --   --  39.8  --   PLT 293  --   --  291  --   APTT  --   --  109*  --   --   HEPARINUNFRC  --   --   --  0.28*  --   CREATININE 1.05  --   --  0.98  --   TROPONINIHS 686* 765*  --  1,111* 1,422*     Estimated Creatinine Clearance: 55.3 mL/min (by C-G formula based on SCr of 0.98 mg/dL).   Medical History: Past Medical History:  Diagnosis Date   Arthritic-like pain    CAD (coronary artery disease)    a. apical MI 5/07 with LHC showing 50% pCFX, 30% pRCA, 99% pLAD, 99% mLAD, 75% dLAD (cypher DES x3 to LAD); b. ETT-myoview (12/08) 81% MPHR,  EF 64%, partially reversible inferoapical perfusion defect similar to prior study 12/07; c. 04/2016 MV: EF 59%, fixed anteroapical defect, no ischemia->Low risk.   Carotid arterial disease ([place])    a. 05/2016 Carotid U/S: 30-40% bilat dzs, f/u in 2 yrs.   CVA (cerebral vascular accident) ([place])    a. 07/2010 - h/o CVA/TIA.   Diet-controlled type 2 diabetes mellitus ([facility])    HLD (hyperlipidemia)    HTN (hypertension)    PAT (paroxysmal atrial tachycardia)    a. 06/2016 Event monitor:  rare episodes of atrial tachycardia, longest 7 beats. No afib.    Medications:  Scheduled:   aspirin       [MAR Hold] aspirin EC  81 mg Oral Daily   [MAR Hold] atorvastatin  80 mg Oral Daily   [MAR Hold] clopidogrel  75 mg Oral Q breakfast   [MAR Hold] insulin aspart  0-15 Units Subcutaneous TID WC   [MAR Hold] insulin aspart  0-5 Units Subcutaneous QHS    labetalol       [MAR Hold] lisinopril  20 mg Oral Daily   [MAR Hold] metoprolol tartrate  25 mg Oral BID   [MAR Hold] pantoprazole  20 mg Oral Daily   [MAR Hold] sertraline  25 mg Oral Daily   sodium chloride flush  3 mL Intravenous Q12H   Infusions:   sodium chloride     sodium chloride     PRN: sodium chloride, [MAR Hold] acetaminophen, acetaminophen, aspirin, [MAR Hold] hydrALAZINE, labetalol, labetalol, [MAR Hold] labetalol, [MAR Hold] nitroGLYCERIN, [MAR Hold] ondansetron (ZOFRAN) IV, sodium chloride flush  Assessment: Raymond Sampson is a 82 y.o. male presenting with chest pain. PMH significant for HLD, HTN, CAD, T2DM. Patient was on DAPT (clopidogrel, ASA) PTA. Patient reports taking 4 ASA '81mg'$  tablets (total '324mg'$ ) at home morning of 1/10. Troponins elevated at 686 ng/L on admission. Pharmacy has been consulted to initiate and manage heparin infusion.  Baseline Labs: aPTT 109, Hgb 13.4, Hct 41.7, Plt 293   Date Time HL Rate/Comment  1/11 0430 0.28 1050/slightly subtherapeutic 1/11 1451 --- 0/hold for procedure 1/11 2045 ---  1050/resume per MD   Goal of Therapy:  Heparin level 0.3-0.7 units/ml Monitor platelets by anticoagulation protocol: Yes   Plan:  Resume heparin infusion at 1050 units/hr Check HL 8 hours after resuming heparin  Continue to monitor H&H and platelets daily while on heparin infusion   Gretel Acre, PharmD PGY1 Pharmacy Resident 04/13/2022 7:15 PM

## 2022-04-13 NOTE — Assessment & Plan Note (Addendum)
Mgmt per Cardiology. Initial Cath 1/11 showed triple vessel disease. Staged PCI planned, return to cath lab today. Continue heparin Follow up pending Echo. Continue metoprolol, statin, lisinopril, ASA

## 2022-04-13 NOTE — Assessment & Plan Note (Addendum)
Recent COVID-19 infection, diagnosed 03/31/2022. CT findings are likely sequelae. No hypoxia or respiratory complaints.

## 2022-04-13 NOTE — Assessment & Plan Note (Addendum)
Repeat CT scan in 3 months per radiology recommendation. Finds possibly COVID-19 sequelae. PCP to follow up.

## 2022-04-13 NOTE — Progress Notes (Signed)
  Progress Note   Patient: Raymond Sampson:[id number] DOB: [birth year] DOA: 04/12/2022     0 DOS: the patient was seen and examined on 04/13/2022   Brief hospital course: Raymond Sampson is a 82 y.o. male with history of CAD with MI 12/06/2005 status post stenting x 3 to the LAD, TIA/CVA in April 2012, atrial tachycardia, bilateral carotid artery disease, dementia, diabetes type 2, hypertension, hyperlipidemia, prior tobacco use who presented to the ED on 04/12/2022 for evaluation of chest pain.   Pain was notably worse with exertion, non-radiatin, substernal and mild to moderate in intensity.  ED evaluation was concerning for Non-STEMI with hs-troponin elevation with trend upwards (686 >> 765 >> 1111 >> 1422).  He was admitted to hospital, started on IV heparin and Cardiology consulted.   1/11 - cardiology plans for left heart cath this afternoon.  Pt denies active chest pain overnight or this AM.  Assessment and Plan: * NSTEMI (non-ST elevated myocardial infarction) ([place]) Mgmt per Cardiology. Cath planned this afternoon. Continue heparin Follow up pending Echo. Trend trop to peak Continue metoprolol, statin, lisinopril, ASA  Lung nodule Repeat CT scan in 3 months per radiology recommendation. Finds possibly COVID-19 sequelae. PCP to follow up.  History of COVID-19 Recent COVID-19 infection, diagnosed 03/31/2022. CT findings are likely sequelae. No hypoxia or respiratory complaints.   Chest pain POA, resolved. Mgmt at outlined and per cardiology. See NSTEMI.  Essential hypertension BP's elevated on admission 198/76 initially in ED. SBP ranging 140--188 today. Resumed lisinopril, metoprolol. Continue IV labetalol or hydralazine for now. Resume PO meds after cath.        Subjective: Pt see with wife and cardiology team at bedside, in ED holding for a bed this AM.  He denies CP overnight or this AM.  No current acute complaints. He and wife agreeable to plan for cardiac  cath later today.  Physical Exam: Vitals:   04/13/22 1100 04/13/22 1130 04/13/22 1200 04/13/22 1325  BP: (!) 156/76 (!) 175/74 (!) 188/85 (!) 168/84  Pulse: 69 66 73 70  Resp: '11 11 17 16  '$ Temp:      TempSrc:      SpO2: 94% 95% 96% 92%  Weight:      Height:       General exam: awake, alert, no acute distres HEENT: atraumatic, clear conjunctiva, anicteric sclera, moist mucus membranes, hearing grossly normal  Respiratory system: CTAB no wheezes, rales or rhonchi, normal respiratory effort. Cardiovascular system: normal S1/S2, RRR, no JVD, murmurs, rubs, gallops,  no pedal edema.   Gastrointestinal system: soft, NT, ND, no HSM felt, +bowel sounds. Central nervous system: A&O x3. no gross focal neurologic deficits, normal speech Extremities: moves all , no edema, normal tone Skin: dry, intact, normal temperature, normal color, No rashes, lesions or ulcers Psychiatry: normal mood, congruent affect, judgement and insight appear normal   Data Reviewed:  Notable labs --- troponin 765 >> 1111 >> 1433.  Glucose 156.  Total cholesterol 205, HDL 40, LDL 114, TG's 257, VLDL 51.  WBC 11.7.     Family Communication: wife at bedside on rounds  Disposition: Status is: Inpatient Remains inpatient appropriate because: ongoing evaluation with cardiac cath planned, remains on IV heparin.  D/C pending cardiology clearance.   Planned Discharge Destination: Home    Time spent: 45 minutes  Author: Ezekiel Slocumb, DO 04/13/2022 2:15 PM  For on call review [email or web address].

## 2022-04-13 NOTE — Assessment & Plan Note (Addendum)
POA, resolved. Mgmt at outlined and per cardiology. See NSTEMI.

## 2022-04-13 NOTE — Assessment & Plan Note (Addendum)
BP's elevated on admission, mildly so but fluctuating. Resumed lisinopril, metoprolol.

## 2022-04-13 NOTE — ED Notes (Signed)
AM EKG completed

## 2022-04-13 NOTE — Consult Note (Signed)
ANTICOAGULATION CONSULT NOTE - Initial Consult  Pharmacy Consult for heparin infusion Indication: chest pain/ACS  No Known Allergies  Patient Measurements: Height: '5\' 7"'$  (170.2 cm) Weight: 74.8 kg (165 lb) IBW/kg (Calculated) : 66.1 Heparin Dosing Weight: 74.8 kg  Vital Signs: Temp: 98 F (36.7 C) (01/11 0338) Temp Source: Oral (01/11 0338) BP: 140/73 (01/11 0511) Pulse Rate: 77 (01/11 0511)  Labs: Recent Labs    04/12/22 1730 04/12/22 1918 04/12/22 2047 04/13/22 0430  HGB 13.4  --   --  13.3  HCT 41.7  --   --  39.8  PLT 293  --   --  291  APTT  --   --  109*  --   HEPARINUNFRC  --   --   --  0.28*  CREATININE 1.05  --   --   --   TROPONINIHS 686* 765*  --   --      Estimated Creatinine Clearance: 51.6 mL/min (by C-G formula based on SCr of 1.05 mg/dL).   Medical History: Past Medical History:  Diagnosis Date   Arthritic-like pain    CAD (coronary artery disease)    a. apical MI 5/07 with LHC showing 50% pCFX, 30% pRCA, 99% pLAD, 99% mLAD, 75% dLAD (cypher DES x3 to LAD); b. ETT-myoview (12/08) 81% MPHR,  EF 64%, partially reversible inferoapical perfusion defect similar to prior study 12/07; c. 04/2016 MV: EF 59%, fixed anteroapical defect, no ischemia->Low risk.   Carotid arterial disease ([place])    a. 05/2016 Carotid U/S: 30-40% bilat dzs, f/u in 2 yrs.   CVA (cerebral vascular accident) ([place])    a. 07/2010 - h/o CVA/TIA.   Diet-controlled type 2 diabetes mellitus ([facility])    HLD (hyperlipidemia)    HTN (hypertension)    PAT (paroxysmal atrial tachycardia) ([place])    a. 06/2016 Event monitor:  rare episodes of atrial tachycardia, longest 7 beats. No afib.    Medications:  PTA: Clopidogrel '75mg'$  every morning with breakfast.  Patient reports taking 4 asa '81mg'$  tablets at home (total '324mg'$ ) today Inpatient: heparin infusion (1/10 > )  Allergies: NKDA  Assessment: 82 year old male with history of HLD, HTN, CAD, and T2DM presents to ED with complaints of chest pain.  Troponins elevated at 686 ng/L. Pharmacy consulted for management of heparin infusion in the setting of ACS/STEMI  Date Time aPTT/HL  Rate/Comment 1/11 0430 HL 0.28      Slightly subtherapeutic  Goal of Therapy:  Heparin level 0.3-0.7 units/ml Monitor platelets by anticoagulation protocol: Yes   Plan:  Increase heparin infusion to 1050 units/hr Recheck HL in 8 hrs after rate change Continue to monitor H&H and platelets daily while on heparin gtt.  Renda Rolls, PharmD, [facility] 04/13/2022 5:24 AM

## 2022-04-13 NOTE — Plan of Care (Signed)
  Problem: Education: Goal: Ability to describe self-care measures that may prevent or decrease complications (Diabetes Survival Skills Education) will improve Outcome: Progressing Goal: Individualized Educational Video(s) Outcome: Progressing   Problem: Coping: Goal: Ability to adjust to condition or change in health will improve Outcome: Progressing   Problem: Fluid Volume: Goal: Ability to maintain a balanced intake and output will improve Outcome: Progressing   Problem: Health Behavior/Discharge Planning: Goal: Ability to identify and utilize available resources and services will improve Outcome: Progressing Goal: Ability to manage health-related needs will improve Outcome: Progressing   Problem: Metabolic: Goal: Ability to maintain appropriate glucose levels will improve Outcome: Progressing   Problem: Nutritional: Goal: Maintenance of adequate nutrition will improve Outcome: Progressing Goal: Progress toward achieving an optimal weight will improve Outcome: Progressing   Problem: Skin Integrity: Goal: Risk for impaired skin integrity will decrease Outcome: Progressing   Problem: Tissue Perfusion: Goal: Adequacy of tissue perfusion will improve Outcome: Progressing   Problem: Education: Goal: Understanding of cardiac disease, CV risk reduction, and recovery process will improve Outcome: Progressing Goal: Individualized Educational Video(s) Outcome: Progressing   Problem: Activity: Goal: Ability to tolerate increased activity will improve Outcome: Progressing   Problem: Cardiac: Goal: Ability to achieve and maintain adequate cardiovascular perfusion will improve Outcome: Progressing   Problem: Health Behavior/Discharge Planning: Goal: Ability to safely manage health-related needs after discharge will improve Outcome: Progressing   Problem: Education: Goal: Understanding of CV disease, CV risk reduction, and recovery process will improve Outcome:  Progressing Goal: Individualized Educational Video(s) Outcome: Progressing   Problem: Activity: Goal: Ability to return to baseline activity level will improve Outcome: Progressing   Problem: Cardiovascular: Goal: Ability to achieve and maintain adequate cardiovascular perfusion will improve Outcome: Progressing Goal: Vascular access site(s) Level 0-1 will be maintained Outcome: Progressing   Problem: Health Behavior/Discharge Planning: Goal: Ability to safely manage health-related needs after discharge will improve Outcome: Progressing   Problem: Education: Goal: Knowledge of General Education information will improve Description: Including pain rating scale, medication(s)/side effects and non-pharmacologic comfort measures Outcome: Progressing   Problem: Health Behavior/Discharge Planning: Goal: Ability to manage health-related needs will improve Outcome: Progressing   Problem: Clinical Measurements: Goal: Ability to maintain clinical measurements within normal limits will improve Outcome: Progressing Goal: Will remain free from infection Outcome: Progressing Goal: Diagnostic test results will improve Outcome: Progressing Goal: Respiratory complications will improve Outcome: Progressing Goal: Cardiovascular complication will be avoided Outcome: Progressing   Problem: Activity: Goal: Risk for activity intolerance will decrease Outcome: Progressing   Problem: Nutrition: Goal: Adequate nutrition will be maintained Outcome: Progressing   Problem: Coping: Goal: Level of anxiety will decrease Outcome: Progressing   Problem: Elimination: Goal: Will not experience complications related to bowel motility Outcome: Progressing Goal: Will not experience complications related to urinary retention Outcome: Progressing   Problem: Pain Managment: Goal: General experience of comfort will improve Outcome: Progressing   Problem: Safety: Goal: Ability to remain free from  injury will improve Outcome: Progressing   Problem: Skin Integrity: Goal: Risk for impaired skin integrity will decrease Outcome: Progressing

## 2022-04-13 NOTE — Telephone Encounter (Signed)
The patient is currently in the ER and pending a possible cath with Dr. Ellyn Hack today.

## 2022-04-13 NOTE — Progress Notes (Signed)
*  PRELIMINARY RESULTS* Echocardiogram 2D Echocardiogram has been performed.  Raymond Sampson 04/13/2022, 9:37 AM

## 2022-04-13 NOTE — Interval H&P Note (Signed)
History and Physical Interval Note:  04/13/2022 4:44 PM  Raymond Sampson  has presented today for surgery, with the diagnosis of nstemi.  The various methods of treatment have been discussed with the patient and family. After consideration of risks, benefits and other options for treatment, the patient has consented to  Procedure(s): LEFT HEART CATH AND CORONARY ANGIOGRAPHY (N/A)  PERCUTANEOUS CORONARY INTERVENTION  as a surgical intervention.  The patient's history has been reviewed, patient examined, no change in status, stable for surgery.  I have reviewed the patient's chart and labs.  Questions were answered to the patient's satisfaction.    Cath Lab Visit (complete for each Cath Lab visit)  Clinical Evaluation Leading to the Procedure:   ACS: Yes.    Non-ACS:    Anginal Classification: CCS IV  Anti-ischemic medical therapy: Maximal Therapy (2 or more classes of medications)  Non-Invasive Test Results: No non-invasive testing performed  Prior CABG: No previous CABG  \   Glenetta Hew

## 2022-04-13 NOTE — Consult Note (Signed)
Cardiology Consultation   Patient ID: Raymond Sampson MRN: [id number]; DOB: 09/26/1940  Admit date: 04/12/2022 Date of Consult: 04/13/2022  PCP:  [name], MD   [facility] Cardiologist:  Ida Rogue, MD   {  Patient Profile:   Raymond Sampson is a 82 y.o. male with a hx of CAD with apical MI 12/06/2005 status post stenting x 3 to the LAD, TIA/CVA in April 2012, atrial tachycardia, bilateral carotid artery disease, dementia, diabetes type 2, hypertension, hyperlipidemia, prior tobacco use who is being seen 04/13/2022 for the evaluation of NSTEMI at the request of Dr. Arbutus Ped.  History of Present Illness:   Mr. Raymond Sampson is followed by Dr. Rockey Situ for the above cardiac history.  Patient had an MI in 2007 that was treated with PCI/DES x 3 to LAD.  He had a low risk MPI in 2013 was severely elevated blood pressure.  In 2014 TTE showed EF 60 to 83%, diastolic dysfunction.  He was seen in 2018 for palpitations and abnormal EKG with subsequent low risk Myoview Lexiscan.  Cardiac monitoring in 2019 showed sinus rhythm with 7 runs of atrial tachycardia, longest 7 beats.  No evidence of A-fib.  Carotid artery ultrasound in 12/22/2018 showed right and left ICA 1 to 39% stenosis.  Patient was last seen 05/23/2021 and was overall stable from a cardiac standpoint.  Patient presented to [facility] ED on 04/12/2022 for chest pain. He had chest pain Tuesday evening and some the next day, Wednesday.  Pain improved with laying down. His wife thought it was indigestion. Patient is moderately active. The patient reported recent COVID infection.   In the ER blood pressures are 198/76, pulse 68 bpm, respiratory rate 16, afebrile, normal O2.  Labs showed high-sensitivity troponin [phone]. Other labs showed normal BMET . CBC 11.7. EKG showed normal sinus rhythm, RAD, 69 bpm, nonspecific ST and T wave changes.  Patient was started on IV heparin and admitted for further workup.  Past  Medical History:  Diagnosis Date   Arthritic-like pain    CAD (coronary artery disease)    a. apical MI 5/07 with LHC showing 50% pCFX, 30% pRCA, 99% pLAD, 99% mLAD, 75% dLAD (cypher DES x3 to LAD); b. ETT-myoview (12/08) 81% MPHR,  EF 64%, partially reversible inferoapical perfusion defect similar to prior study 12/07; c. 04/2016 MV: EF 59%, fixed anteroapical defect, no ischemia->Low risk.   Carotid arterial disease ([place])    a. 05/2016 Carotid U/S: 30-40% bilat dzs, f/u in 2 yrs.   CVA (cerebral vascular accident) ([place])    a. 07/2010 - h/o CVA/TIA.   Diet-controlled type 2 diabetes mellitus (HCC)    HLD (hyperlipidemia)    HTN (hypertension)    PAT (paroxysmal atrial tachycardia)    a. 06/2016 Event monitor:  rare episodes of atrial tachycardia, longest 7 beats. No afib.    Past Surgical History:  Procedure Laterality Date   BACK SURGERY     CARDIAC CATHETERIZATION     CORONARY ANGIOPLASTY WITH STENT PLACEMENT     apical MI 5/07 with LHC showing 50% pCFX, 30% pRCA, 99% pLAD, 9%% mLAD, 75% dLAD. cypher DES x3 to LAD. ETT-myoview (12/08) 81% MPHR, 8'1, EF 64%, partially reversible inferoapical perfusion defect similar to prior study 12/07     Home Medications:  Prior to Admission medications   Medication Sig Start Date End Date Taking? Authorizing Provider  aspirin 81 MG tablet Take 81 mg by mouth daily.   Yes [provider]  clopidogrel (PLAVIX) 75 MG tablet TAKE 1 TABLET BY MOUTH DAILY WITH BREAKFAST. 07/04/21  Yes Gollan, Kathlene November, MD  cyanocobalamin 1000 MCG tablet Take 1,000 mcg by mouth daily.   Yes [provider]  lisinopril (ZESTRIL) 20 MG tablet TAKE 1 TABLET BY MOUTH EVERY DAY 07/04/21  Yes Gollan, Kathlene November, MD  metFORMIN (GLUCOPHAGE) 1000 MG tablet Take 1,000 mg by mouth 2 (two) times daily with a meal.   Yes [provider]  metoprolol tartrate (LOPRESSOR) 25 MG tablet TAKE 1 TABLET BY MOUTH TWICE A DAY 10/18/21  Yes Gollan, Kathlene November, MD  sertraline  (ZOLOFT) 25 MG tablet Take 1 tablet (25 mg total) by mouth daily. 04/26/16  Yes Gollan, Kathlene November, MD  simvastatin (ZOCOR) 20 MG tablet TAKE 1 TABLET BY MOUTH EVERY DAY 08/18/21  Yes Gollan, Kathlene November, MD  sitaGLIPtin (JANUVIA) 25 MG tablet Take 1 tablet by mouth daily. 02/21/21  Yes [provider]  amLODipine (NORVASC) 5 MG tablet TAKE 1 TABLET (5 MG TOTAL) BY MOUTH DAILY. Patient not taking: Reported on 04/13/2022 10/18/21   Minna Merritts, MD    Inpatient Medications: Scheduled Meds:  [START ON 04/14/2022] aspirin  81 mg Oral Pre-Cath   [START ON 04/14/2022] aspirin EC  81 mg Oral Daily   atorvastatin  80 mg Oral Daily   clopidogrel  75 mg Oral Q breakfast   insulin aspart  0-15 Units Subcutaneous TID WC   insulin aspart  0-5 Units Subcutaneous QHS   lisinopril  20 mg Oral Daily   metoprolol tartrate  25 mg Oral BID   pantoprazole  20 mg Oral Daily   sertraline  25 mg Oral Daily   Continuous Infusions:  sodium chloride     heparin 1,050 Units/hr (04/13/22 1129)   PRN Meds: acetaminophen, hydrALAZINE, labetalol, nitroGLYCERIN, ondansetron (ZOFRAN) IV  Allergies:   No Known Allergies  Social History:   Social History   Socioeconomic History   Marital status: Married    Spouse name: Not on file   Number of children: Not on file   Years of education: Not on file   Highest education level: Not on file  Occupational History   Occupation: retired  Tobacco Use   Smoking status: Former    Packs/day: 1.00    Years: 25.00    Total pack years: 25.00    Types: Cigarettes    Quit date: 04/17/1985    Years since quitting: 37.0   Smokeless tobacco: Never  Vaping Use   Vaping Use: Never used  Substance and Sexual Activity   Alcohol use: No   Drug use: No   Sexual activity: Not on file  Other Topics Concern   Not on file  Social History Narrative   Part time work at [employer]. Does not regularly exercise.    Social Determinants of Health   Financial Resource  Strain: Not on file  Food Insecurity: No Food Insecurity (04/13/2022)   Hunger Vital Sign    Worried About Running Out of Food in the Last Year: Never true    Ran Out of Food in the Last Year: Never true  Transportation Needs: No Transportation Needs (04/13/2022)   PRAPARE - Hydrologist (Medical): No    Lack of Transportation (Non-Medical): No  Physical Activity: Not on file  Stress: Not on file  Social Connections: Not on file  Intimate Partner Violence: Not At Risk (04/13/2022)   Humiliation, Afraid, Rape, and Kick questionnaire  Fear of Current or Ex-Partner: No    Emotionally Abused: No    Physically Abused: No    Sexually Abused: No    Family History:    Family History  Family history unknown: Yes     ROS:  Please see the history of present illness.   All other ROS reviewed and negative.     Physical Exam/Data:   Vitals:   04/13/22 0700 04/13/22 1009 04/13/22 1030 04/13/22 1100  BP: (!) 148/84 (!) 155/69 (!) 152/81 (!) 156/76  Pulse: 70  70 69  Resp: '11  12 11  '$ Temp:   98.1 F (36.7 C)   TempSrc:      SpO2: 95%  93% 94%  Weight:      Height:        Intake/Output Summary (Last 24 hours) at 04/13/2022 1133 Last data filed at 04/13/2022 0508 Gross per 24 hour  Intake --  Output 400 ml  Net -400 ml      04/12/2022    5:27 PM 05/31/2021   11:54 AM 08/19/2020    7:54 AM  Last 3 Weights  Weight (lbs) 165 lb 159 lb 2 oz 160 lb  Weight (kg) 74.844 kg 72.179 kg 72.576 kg     Body mass index is 25.84 kg/m.  General:  Well nourished, well developed, in no acute distress HEENT: normal Neck: no JVD Vascular: No carotid bruits; Distal pulses 2+ bilaterally Cardiac:  normal S1, S2; RRR; no murmur  Lungs:  clear to auscultation bilaterally, no wheezing, rhonchi or rales  Abd: soft, nontender, no hepatomegaly  Ext: no edema Musculoskeletal:  No deformities, BUE and BLE strength normal and equal Skin: warm and dry  Neuro:  CNs 2-12  intact, no focal abnormalities noted Psych:  Normal affect   EKG:  The EKG was personally reviewed and demonstrates:  NSR, nonspecific ST/T wave changes Telemetry:  Telemetry was personally reviewed and demonstrates:  NSR HR 60-70s  Relevant CV Studies:  Echo ordered  Laboratory Data:  High Sensitivity Troponin:   Recent Labs  Lab 04/12/22 1730 04/12/22 1918 04/13/22 0430 04/13/22 1014  TROPONINIHS 686* 765* 1,111* 1,422*     Chemistry Recent Labs  Lab 04/12/22 1730 04/13/22 0430  NA 136 137  K 4.7 3.7  CL 102 101  CO2 25 23  GLUCOSE 135* 156*  BUN 16 15  CREATININE 1.05 0.98  CALCIUM 9.2 8.9  GFRNONAA >60 >60  ANIONGAP 9 13    Recent Labs  Lab 04/12/22 1730  PROT 7.6  ALBUMIN 4.0  AST 24  ALT 17  ALKPHOS 72  BILITOT 0.4   Lipids  Recent Labs  Lab 04/13/22 0430  CHOL 205*  TRIG 257*  HDL 40*  LDLCALC 114*  CHOLHDL 5.1    Hematology Recent Labs  Lab 04/12/22 1730 04/13/22 0430  WBC 9.0 11.7*  RBC 4.48 4.43  HGB 13.4 13.3  HCT 41.7 39.8  MCV 93.1 89.8  MCH 29.9 30.0  MCHC 32.1 33.4  RDW 12.9 12.7  PLT 293 291   Thyroid No results for input(s): "TSH", "FREET4" in the last 168 hours.  BNPNo results for input(s): "BNP", "PROBNP" in the last 168 hours.  DDimer No results for input(s): "DDIMER" in the last 168 hours.   Radiology/Studies:  CT Angio Chest PE W and/or Wo Contrast  Result Date: 04/12/2022 CLINICAL DATA:  High probability for pulmonary embolism. Elevated blood pressure. EXAM: CT ANGIOGRAPHY CHEST WITH CONTRAST TECHNIQUE: Multidetector CT imaging of the chest  was performed using the standard protocol during bolus administration of intravenous contrast. Multiplanar CT image reconstructions and MIPs were obtained to evaluate the vascular anatomy. RADIATION DOSE REDUCTION: This exam was performed according to the departmental dose-optimization program which includes automated exposure control, adjustment of the mA and/or kV according to  patient size and/or use of iterative reconstruction technique. CONTRAST:  6m OMNIPAQUE IOHEXOL 350 MG/ML SOLN COMPARISON:  None Available. FINDINGS: Cardiovascular: Satisfactory opacification of the pulmonary arteries to the segmental level. No evidence of pulmonary embolism. Normal heart size. No pericardial effusion. There are atherosclerotic calcifications of the aorta and coronary arteries. Mediastinum/Nodes: There are mildly enlarged right hilar lymph nodes measuring up to 1 cm. There are nonenlarged mediastinal lymph nodes diffusely. There also nonenlarged left hilar lymph nodes. The esophagus and thyroid gland are within normal limits. Lungs/Pleura: Mild emphysematous changes are present. There are scattered patchy ground-glass opacities throughout both lungs. There is a focal slightly nodular density in the left lung base measuring 2.5 by 1.1 cm. There is no pleural effusion or pneumothorax. Upper Abdomen: There is a low-density right adrenal nodule measuring 16 mm compatible with benign adenoma. Musculoskeletal: No acute fractures are seen. Cervical spinal fusion plate is partially visualized. Review of the MIP images confirms the above findings. IMPRESSION: 1. No evidence for pulmonary embolism. 2. Patchy ground-glass opacities throughout both lungs may represent infection/inflammation or mild edema. 3. Focal nodular density in the left lung base measuring 2.5 x 1.1 cm. Per Fleischner Society Guidelines, consider a non-contrast Chest CT at 3 months, a PET/CT, or tissue sampling. These guidelines do not apply to immunocompromised patients and patients with cancer. Follow up in patients with significant comorbidities as clinically warranted. For lung cancer screening, adhere to Lung-RADS guidelines. Reference: Radiology. 2017; 284(1):228-43. Findings may represent focal atelectasis, infection, or neoplasm. 4. Mild right hilar lymphadenopathy. 5. Right adrenal adenoma. Aortic Atherosclerosis (ICD10-I70.0).  Electronically Signed   By: ARonney AstersM.D.   On: 04/12/2022 20:58   DG Chest 2 View  Result Date: 04/12/2022 CLINICAL DATA:  Chest pain EXAM: CHEST - 2 VIEW COMPARISON:  Chest radiograph 06/05/2017 FINDINGS: The cardiomediastinal silhouette is normal There is no focal consolidation or pulmonary edema. There is no pleural effusion or pneumothorax There is no acute osseous abnormality. IMPRESSION: No radiographic evidence of acute cardiopulmonary process. Electronically Signed   By: PValetta MoleM.D.   On: 04/12/2022 18:11     Assessment and Plan:   Non-STEMI CAD s/p remote stenting in 2007 - presented with sharp chest pain improved with ASA - HS trop up to 1,100, continue to trend - IV heparin - echo  ordered - patient ate this morning around 7:30am. He is now NPO - continue PTA ASA '81mg'$  daily and Plavix '75mg'$  daily - PTA Lopressor '25mg'$  BID, simvastatin '20mg'$  daily, lisinopril '20mg'$  daily - he will need LHC Risks and benefits of cardiac catheterization have been discussed with the patient.  These include bleeding, infection, kidney damage, stroke, heart attack, death.  The patient understands these risks and is willing to proceed.  H/o COVID-19 - per IM  Carotid artery disease - Carotid [place]ain 2020 showed 1-39% disease - continue Apsirin and statin therapy  HTN - BP severely elevated on admission - PTA amlodipine '5mg'$  daily, lisinopril '20mg'$  daily, Lopressor '25mg'$  BID. Amlodipine held on admission - BP still elevated  Atrial tachycardia - continue with telemetry  HLD - TG 257, HDL 40, LDL 114 - PTA simvastatin>changed to Lipitor '80mg'$  daily  For questions or updates, please contact [place] Please consult [email or web address] for contact info under    Signed, Kamaryn Grimley Ninfa Meeker, PA-C  04/13/2022 11:33 AM

## 2022-04-13 NOTE — H&P (Signed)
History and Physical    Patient: Raymond Sampson:[id number] DOB: 11-24-1940 DOA: 04/12/2022 DOS: the patient was seen and examined on 04/13/2022 PCP: Raymond Hire, MD  Patient coming from: Home  Chief Complaint:  Chief Complaint  Patient presents with   Chest Pain   HPI: Raymond Sampson is a 82 y.o. male Who was in his usual state of health till yesterday.  Since then the patient reports some mild to moderate intensity achingSubsternal chest pain.  Worsened to some extent with exertion without any radiation.  There is no associatedShortness of breath, fever, coughing, palpitation or presyncope.  Patient had a recurrence of the pain today and therefore came to the ER. ER workup as noted below, patient has been started on heparin infusion.  Patient at this time is totally asymptomatic.  Patient is aware of the ER workup  Review of Systems: As mentioned in the history of present illness. All other systems reviewed and are negative. Past Medical History:  Diagnosis Date   Arthritic-like pain    CAD (coronary artery disease)    a. apical MI 5/07 with LHC showing 50% pCFX, 30% pRCA, 99% pLAD, 99% mLAD, 75% dLAD (cypher DES x3 to LAD); b. ETT-myoview (12/08) 81% MPHR,  EF 64%, partially reversible inferoapical perfusion defect similar to prior study 12/07; c. 04/2016 MV: EF 59%, fixed anteroapical defect, no ischemia->Low risk.   Carotid arterial disease ([place])    a. 05/2016 Carotid U/S: 30-40% bilat dzs, f/u in 2 yrs.   CVA (cerebral vascular accident) ([place])    a. 07/2010 - h/o CVA/TIA.   Diet-controlled type 2 diabetes mellitus (H[facility])    HLD (hyperlipidemia)    HTN (hypertension)    PAT (paroxysmal atrial tachycardia) ([place])    a. 06/2016 Event monitor:  rare episodes of atrial tachycardia, longest 7 beats. No afib.   Past Surgical History:  Procedure Laterality Date   BACK SURGERY     CARDIAC CATHETERIZATION     CORONARY ANGIOPLASTY WITH STENT PLACEMENT     apical MI 5/07 with LHC  showing 50% pCFX, 30% pRCA, 99% pLAD, 9%% mLAD, 75% dLAD. cypher DES x3 to LAD. ETT-myoview (12/08) 81% MPHR, 8'1, EF 64%, partially reversible inferoapical perfusion defect similar to prior study 12/07   Social History:  reports that he quit smoking about 37 years ago. His smoking use included cigarettes. He has a 25.00 pack-year smoking history. He has never used smokeless tobacco. He reports that he does not drink alcohol and does not use drugs.  No Known Allergies  Family History  Family history unknown: Yes    Prior to Admission medications   Medication Sig Start Date End Date Taking? Authorizing Provider  amLODipine (NORVASC) 5 MG tablet TAKE 1 TABLET (5 MG TOTAL) BY MOUTH DAILY. 10/18/21   Minna Merritts, MD  aspirin 81 MG tablet Take 81 mg by mouth daily.    [provider]  clopidogrel (PLAVIX) 75 MG tablet TAKE 1 TABLET BY MOUTH DAILY WITH BREAKFAST. 07/04/21   Minna Merritts, MD  cyanocobalamin 1000 MCG tablet Take 1,000 mcg by mouth daily.    [provider]  lisinopril (ZESTRIL) 20 MG tablet TAKE 1 TABLET BY MOUTH EVERY DAY 07/04/21   Minna Merritts, MD  metFORMIN (GLUCOPHAGE) 1000 MG tablet Take 1,000 mg by mouth 2 (two) times daily with a meal.    [provider]  metoprolol tartrate (LOPRESSOR) 25 MG tablet TAKE 1 TABLET BY MOUTH TWICE A DAY 10/18/21  Minna Merritts, MD  sertraline (ZOLOFT) 25 MG tablet Take 1 tablet (25 mg total) by mouth daily. 04/26/16   Minna Merritts, MD  simvastatin (ZOCOR) 20 MG tablet TAKE 1 TABLET BY MOUTH EVERY DAY 08/18/21   Minna Merritts, MD  sitaGLIPtin (JANUVIA) 25 MG tablet Take 1 tablet by mouth daily. 02/21/21   [provider]    Physical Exam: Vitals:   04/12/22 1930 04/12/22 2000 04/12/22 2050 04/12/22 2200  BP: (!) 189/79 (!) 198/81 (!) 186/83 (!) 188/87  Pulse: 74 71 70 74  Resp: '14 16 16 20  '$ Temp:      TempSrc:      SpO2: 97% 96% 95% 94%  Weight:      Height:       Hypertension  no distress, restful, wife at bedside Cardiovascular exam: S1-S2 normal Abdomen soft nontender Respiratory exam: Bilateral air entry vesicular All extremities without edema without any limitation of motion. Neurologic exam: Nonfocal exam Data Reviewed:  Results for orders placed or performed during the hospital encounter of 04/12/22 (from the past 24 hour(s))  CBC     Status: None   Collection Time: 04/12/22  5:30 PM  Result Value Ref Range   WBC 9.0 4.0 - 10.5 K/uL   RBC 4.48 4.22 - 5.81 MIL/uL   Hemoglobin 13.4 13.0 - 17.0 g/dL   HCT 41.7 39.0 - 52.0 %   MCV 93.1 80.0 - 100.0 fL   MCH 29.9 26.0 - 34.0 pg   MCHC 32.1 30.0 - 36.0 g/dL   RDW 12.9 11.5 - 15.5 %   Platelets 293 150 - 400 K/uL   nRBC 0.0 0.0 - 0.2 %  Troponin I (High Sensitivity)     Status: Abnormal   Collection Time: 04/12/22  5:30 PM  Result Value Ref Range   Troponin I (High Sensitivity) 686 (HH) <18 ng/L  Comprehensive metabolic panel     Status: Abnormal   Collection Time: 04/12/22  5:30 PM  Result Value Ref Range   Sodium 136 135 - 145 mmol/L   Potassium 4.7 3.5 - 5.1 mmol/L   Chloride 102 98 - 111 mmol/L   CO2 25 22 - 32 mmol/L   Glucose, Bld 135 (H) 70 - 99 mg/dL   BUN 16 8 - 23 mg/dL   Creatinine, Ser 1.05 0.61 - 1.24 mg/dL   Calcium 9.2 8.9 - 10.3 mg/dL   Total Protein 7.6 6.5 - 8.1 g/dL   Albumin 4.0 3.5 - 5.0 g/dL   AST 24 15 - 41 U/L   ALT 17 0 - 44 U/L   Alkaline Phosphatase 72 38 - 126 U/L   Total Bilirubin 0.4 0.3 - 1.2 mg/dL   GFR, Estimated >60 >60 mL/min   Anion gap 9 5 - 15  Troponin I (High Sensitivity)     Status: Abnormal   Collection Time: 04/12/22  7:18 PM  Result Value Ref Range   Troponin I (High Sensitivity) 765 (HH) <18 ng/L  APTT     Status: Abnormal   Collection Time: 04/12/22  8:47 PM  Result Value Ref Range   aPTT 109 (H) 24 - 36 seconds    EKG strip reviewd - Nsr. Non specific st t wave changes  IMPRESSION: 1. No evidence for pulmonary embolism. 2. Patchy  ground-glass opacities throughout both lungs may represent infection/inflammation or mild edema. 3. Focal nodular density in the left lung base measuring 2.5 x 1.1 cm. Per Fleischner Society Guidelines, consider a non-contrast  Chest CT at 3 months, a PET/CT, or tissue sampling. These guidelines do not apply to immunocompromised patients and patients with cancer. Follow up in patients with significant comorbidities as clinically warranted. For lung cancer screening, adhere to Lung-RADS guidelines. Reference: Radiology. 2017; 284(1):228-43. Findings may represent focal atelectasis, infection, or neoplasm. 4. Mild right hilar lymphadenopathy. 5. Right adrenal adenoma.  Assessment and Plan: * NSTEMI (non-ST elevated myocardial infarction) ([place]) The patient on heparin, give aspirin now, continue with Plavix.  I have started the patient on atorvastatin.  Patient's blood pressure is above target which will be controlled with labetalol as needed as well as hydralazine as a second line agent.  Meanwhile continue with patient's home medication of metoprolol.  Check echo.  Patient's cardiologist is Dr. Rockey Situ I have put in a staff message for him to r kindly consult on this patient.  Lung nodule Repeat CAT scan in 3 months, I believe this may be likely due to patient's COVID-19 diagnosis  History of COVID-19 And was diagnosed with COVID-19 on March 31, 2022 I believe the current CAT scan findings are as a result of that.  At this time no hypoxia or fever.  Monitoring clinically  Chest pain Solved at this time, use nitroglycerin as needed  Essential hypertension Pressure above target we will treat with labetalol and hydralazine     Advance Care Planning:   Code Status: Full Code   Consults: Dr. Rockey Situ  Family Communication:   Severity of Illness: The appropriate patient status for this patient is INPATIENT. Inpatient status is judged to be reasonable and necessary in order to provide  the required intensity of service to ensure the patient's safety. The patient's presenting symptoms, physical exam findings, and initial radiographic and laboratory data in the context of their chronic comorbidities is felt to place them at high risk for further clinical deterioration. Furthermore, it is not anticipated that the patient will be medically stable for discharge from the hospital within 2 midnights of admission.   * I certify that at the point of admission it is my clinical judgment that the patient will require inpatient hospital care spanning beyond 2 midnights from the point of admission due to high intensity of service, high risk for further deterioration and high frequency of surveillance required.*  Author: Gertie Fey, MD 04/13/2022 12:54 AM  For on call review [email or web address].

## 2022-04-14 ENCOUNTER — Encounter
Admission: EM | Disposition: A | Discharge status: Home / Self Care | Payer: Self-pay | Source: Home / Self Care | Attending: Internal Medicine

## 2022-04-14 ENCOUNTER — Encounter: Payer: Self-pay | Admitting: Cardiology

## 2022-04-14 ENCOUNTER — Other Ambulatory Visit: Payer: Self-pay

## 2022-04-14 DIAGNOSIS — F03A0 Unspecified dementia, mild, without behavioral disturbance, psychotic disturbance, mood disturbance, and anxiety: Secondary | ICD-10-CM

## 2022-04-14 DIAGNOSIS — I214 Non-ST elevation (NSTEMI) myocardial infarction: Secondary | ICD-10-CM | POA: Diagnosis not present

## 2022-04-14 DIAGNOSIS — I2510 Atherosclerotic heart disease of native coronary artery without angina pectoris: Secondary | ICD-10-CM | POA: Diagnosis not present

## 2022-04-14 DIAGNOSIS — I739 Peripheral vascular disease, unspecified: Secondary | ICD-10-CM | POA: Diagnosis not present

## 2022-04-14 DIAGNOSIS — E785 Hyperlipidemia, unspecified: Secondary | ICD-10-CM | POA: Diagnosis not present

## 2022-04-14 DIAGNOSIS — I10 Essential (primary) hypertension: Secondary | ICD-10-CM | POA: Diagnosis not present

## 2022-04-14 HISTORY — PX: CORONARY STENT INTERVENTION: CATH118234

## 2022-04-14 HISTORY — PX: INTRAVASCULAR IMAGING/OCT: CATH118326

## 2022-04-14 LAB — BASIC METABOLIC PANEL
Anion gap: 12 (ref 5–15)
BUN: 17 mg/dL (ref 8–23)
CO2: 20 mmol/L — ABNORMAL LOW (ref 22–32)
Calcium: 9 mg/dL (ref 8.9–10.3)
Chloride: 104 mmol/L (ref 98–111)
Creatinine, Ser: 0.95 mg/dL (ref 0.61–1.24)
GFR, Estimated: >60 mL/min (ref >60)
Glucose, Bld: 235 mg/dL — ABNORMAL HIGH (ref 70–99)
Potassium: 3.6 mmol/L (ref 3.5–5.1)
Sodium: 136 mmol/L (ref 135–145)

## 2022-04-14 LAB — LIPOPROTEIN A (LPA): Lipoprotein (a): 23 nmol/L (ref <75.0)

## 2022-04-14 LAB — GLUCOSE, CAPILLARY
Glucose-Capillary: 220 mg/dL — ABNORMAL HIGH (ref 70–99)
Glucose-Capillary: 185 mg/dL — ABNORMAL HIGH (ref 70–99)
Glucose-Capillary: 170 mg/dL — ABNORMAL HIGH (ref 70–99)

## 2022-04-14 LAB — POCT ACTIVATED CLOTTING TIME: Activated Clotting Time: 314 seconds

## 2022-04-14 LAB — MAGNESIUM: Magnesium: 1.9 mg/dL (ref 1.7–2.4)

## 2022-04-14 LAB — HEPARIN LEVEL (UNFRACTIONATED): Heparin Unfractionated: 0.33 IU/mL (ref 0.30–0.70)

## 2022-04-14 SURGERY — CORONARY STENT INTERVENTION
Anesthesia: Moderate Sedation

## 2022-04-14 MED ORDER — SODIUM CHLORIDE 0.9% FLUSH
3.0000 mL | Freq: Two times a day (BID) | INTRAVENOUS | Status: DC
  Administered 2022-04-14 – 2022-04-15 (×2): 3 mL via INTRAVENOUS

## 2022-04-14 MED ORDER — HEPARIN (PORCINE) IN NACL 1000-0.9 UT/500ML-% IV SOLN
INTRAVENOUS | Status: DC | PRN
  Administered 2022-04-14 (×3): 500 mL

## 2022-04-14 MED ORDER — BIVALIRUDIN BOLUS VIA INFUSION - CUPID
INTRAVENOUS | Status: DC | PRN
  Administered 2022-04-14: 56.1 mg via INTRAVENOUS

## 2022-04-14 MED ORDER — BIVALIRUDIN TRIFLUOROACETATE 250 MG IV SOLR
INTRAVENOUS | Status: AC
  Filled 2022-04-14: qty 250

## 2022-04-14 MED ORDER — SODIUM CHLORIDE 0.9 % WEIGHT BASED INFUSION
3.0000 mL/kg/h | INTRAVENOUS | Status: DC
  Administered 2022-04-14: 3 mL/kg/h via INTRAVENOUS

## 2022-04-14 MED ORDER — HEPARIN (PORCINE) IN NACL 1000-0.9 UT/500ML-% IV SOLN
INTRAVENOUS | Status: AC
  Filled 2022-04-14: qty 1000

## 2022-04-14 MED ORDER — SODIUM CHLORIDE 0.9 % IV SOLN: 250.0000 mL | INTRAVENOUS | Status: DC | PRN

## 2022-04-14 MED ORDER — HYDRALAZINE HCL 20 MG/ML IJ SOLN: 10.0000 mg | INTRAMUSCULAR | Status: AC | PRN

## 2022-04-14 MED ORDER — SODIUM CHLORIDE 0.9 % WEIGHT BASED INFUSION: 1.0000 mL/kg/h | INTRAVENOUS | Status: DC

## 2022-04-14 MED ORDER — FENTANYL CITRATE (PF) 100 MCG/2ML IJ SOLN
INTRAMUSCULAR | Status: AC
  Filled 2022-04-14: qty 2

## 2022-04-14 MED ORDER — CLOPIDOGREL BISULFATE 75 MG PO TABS
ORAL_TABLET | ORAL | Status: AC
  Filled 2022-04-14: qty 4

## 2022-04-14 MED ORDER — LABETALOL HCL 5 MG/ML IV SOLN: 10.0000 mg | INTRAVENOUS | Status: AC | PRN

## 2022-04-14 MED ORDER — IOHEXOL 300 MG/ML  SOLN
INTRAMUSCULAR | Status: DC | PRN
  Administered 2022-04-14: 140 mL

## 2022-04-14 MED ORDER — ENOXAPARIN SODIUM 40 MG/0.4ML IJ SOSY
40.0000 mg | PREFILLED_SYRINGE | INTRAMUSCULAR | Status: DC
  Administered 2022-04-15: 40 mg via SUBCUTANEOUS
  Filled 2022-04-14 (×3): qty 0.4

## 2022-04-14 MED ORDER — SODIUM CHLORIDE 0.9% FLUSH: 3.0000 mL | INTRAVENOUS | Status: DC | PRN

## 2022-04-14 MED ORDER — SODIUM CHLORIDE 0.9% FLUSH
3.0000 mL | INTRAVENOUS | Status: DC | PRN
  Administered 2022-04-15: 3 mL via INTRAVENOUS

## 2022-04-14 MED ORDER — HYDRALAZINE HCL 20 MG/ML IJ SOLN
INTRAMUSCULAR | Status: AC
  Filled 2022-04-14: qty 1

## 2022-04-14 MED ORDER — CLOPIDOGREL BISULFATE 75 MG PO TABS
ORAL_TABLET | ORAL | Status: DC | PRN
  Administered 2022-04-14: 300 mg via ORAL

## 2022-04-14 MED ORDER — SODIUM CHLORIDE 0.9 % IV SOLN
INTRAVENOUS | Status: DC | PRN
  Administered 2022-04-14 (×2): 1.75 mg/kg/h via INTRAVENOUS

## 2022-04-14 MED ORDER — SODIUM CHLORIDE 0.9 % IV SOLN: INTRAVENOUS | Status: AC

## 2022-04-14 MED ORDER — NITROGLYCERIN 1 MG/10 ML FOR IR/CATH LAB
INTRA_ARTERIAL | Status: DC | PRN
  Administered 2022-04-14 (×3): 200 ug via INTRACORONARY
  Administered 2022-04-14: 100 ug via INTRA_ARTERIAL
  Administered 2022-04-14: 200 ug via INTRACORONARY

## 2022-04-14 MED ORDER — NITROGLYCERIN 1 MG/10 ML FOR IR/CATH LAB
INTRA_ARTERIAL | Status: AC
  Filled 2022-04-14: qty 10

## 2022-04-14 MED ORDER — HYDRALAZINE HCL 20 MG/ML IJ SOLN
INTRAMUSCULAR | Status: DC | PRN
  Administered 2022-04-14: 10 mg via INTRAVENOUS

## 2022-04-14 SURGICAL SUPPLY — 27 items
BALLN MINITREK RX 2.0X15 (BALLOONS) ×1 IMPLANT
BALLN SCOREFLEX 2.50X15 (BALLOONS) ×1 IMPLANT
BALLN ~~LOC~~ TREK NEO RX 2.25X12 (BALLOONS) ×1 IMPLANT
BALLN ~~LOC~~ TREK NEO RX 2.5X15 (BALLOONS) IMPLANT
BALLN ~~LOC~~ TREK NEO RX 2.75X15 (BALLOONS) IMPLANT
BALLN ~~LOC~~ TREK NEO RX 3.0X15 (BALLOONS) ×1 IMPLANT
BALLN ~~LOC~~ TREK NEO RX 4.0X12 (BALLOONS) IMPLANT
BALLOON MINITREK RX 2.0X15 (BALLOONS) IMPLANT
BALLOON SCOREFLEX 2.50X15 (BALLOONS) IMPLANT
BALLOON ~~LOC~~ TREK NEO RX 2.25X12 (BALLOONS) IMPLANT
BALLOON ~~LOC~~ TREK NEO RX 3.0X15 (BALLOONS) IMPLANT
CATH DRAGONFLY OPSTAR (CATHETERS) IMPLANT
CATH LAUNCHER 6FR JR4 (CATHETERS) IMPLANT
CATH VISTA GUIDE 6FR XBLAD3.5 (CATHETERS) IMPLANT
DEVICE CLOSURE MYNXGRIP 6/7F (Vascular Products) IMPLANT
KIT ENCORE 26 ADVANTAGE (KITS) IMPLANT
KIT MICROPUNCTURE NIT STIFF (SHEATH) IMPLANT
PACK CARDIAC CATH (CUSTOM PROCEDURE TRAY) ×1 IMPLANT
PROTECTION STATION PRESSURIZED (MISCELLANEOUS) ×1 IMPLANT
SET ATX SIMPLICITY (MISCELLANEOUS) IMPLANT
SHEATH AVANTI 6FR X 11CM (SHEATH) IMPLANT
STATION PROTECTION PRESSURIZED (MISCELLANEOUS) IMPLANT
STENT ONYX FRONTIER 2.0X18 (Permanent Stent) IMPLANT
STENT ONYX FRONTIER 2.5X18 (Permanent Stent) IMPLANT
STENT ONYX FRONTIER 3.5X15 (Permanent Stent) IMPLANT
WIRE GUIDERIGHT .035X150 (WIRE) IMPLANT
WIRE RUNTHROUGH IZANAI 014 180 (WIRE) IMPLANT

## 2022-04-14 NOTE — Progress Notes (Signed)
   04/14/22 1400  Spiritual Encounters  Type of Visit Initial  Care provided to: Pt and family  Conversation partners present during encounter Nurse  Referral source Nurse (RN/NT/LPN)  Reason for visit Advance directives  OnCall Visit Yes   Chaplain responded to nurse page. Chaplain provided compassionate presence and reflective listening as patient spoke about health challenges. Chaplain spoke with patient about advance care directives. Patient wanted wife present and Chaplain will return when wife is present.

## 2022-04-14 NOTE — Evaluation (Signed)
Physical Therapy Evaluation Patient Details Name: Raymond Sampson MRN: [id number] DOB: [birth year] Today's Date: 04/14/2022  History of Present Illness  82 y/o male presented to ED on 04/12/22 for chest pain intermittently over past week. Admitted for NSTEMI. S/p L heart cath on 1/11. PMH: HTN, hx of MI, CAD, hx of CVA  Clinical Impression  Patient admitted with the above. PTA, patient lives with wife and reports independence with mobility and ADLs. Patient presents with impaired balance, weakness, and decreased activity tolerance. Required minA for bed mobility and min guard for sit to stand transfer but with initial posterior lean. Able to correct with cueing. Ambulated 180' with IV pole and min guard-minA with LOB x 2-3 during mobility. Encouraged use of RW for ambulation while admitted and at discharge to reduce risk for falls. Wife and patient verbalized understanding. Patient will benefit from skilled PT services during acute stay to address listed deficits. Recommend OPPT at discharge to address balance and strength deficits.        Recommendations for follow up therapy are one component of a multi-disciplinary discharge planning process, led by the attending physician.  Recommendations may be updated based on patient status, additional functional criteria and insurance authorization.  Follow Up Recommendations Outpatient PT      Assistance Recommended at Discharge Frequent or constant Supervision/Assistance  Patient can return home with the following  A little help with walking and/or transfers;A little help with bathing/dressing/bathroom;Assistance with cooking/housework;Assist for transportation;Help with stairs or ramp for entrance    Equipment Recommendations Rolling Zymiere Trostle (2 wheels)  Recommendations for Other Services       Functional Status Assessment Patient has had a recent decline in their functional status and demonstrates the ability to make significant improvements in  function in a reasonable and predictable amount of time.     Precautions / Restrictions Precautions Precautions: Fall Restrictions Weight Bearing Restrictions: No      Mobility  Bed Mobility Overal bed mobility: Needs Assistance Bed Mobility: Supine to Sit     Supine to sit: Min assist     General bed mobility comments: assist for repositioning hips towards EOB. Wife attempting to assist with trunk elevation and other aspects of bed mobility that patient does not need assistance with    Transfers Overall transfer level: Needs assistance Equipment used: None Transfers: Sit to/from Stand Sit to Stand: Min guard           General transfer comment: increased time to come into standing. Initially with posterior lean but able to correct with cueing    Ambulation/Gait Ambulation/Gait assistance: Min guard, Min assist Gait Distance (Feet): 180 Feet Assistive device: IV Pole Gait Pattern/deviations: Step-through pattern, Decreased stride length, Trunk flexed, Drifts right/left Gait velocity: decreased     General Gait Details: min guard overall with use of IV pole but required up to minA 2/2 LOB x 2-3 during ambulation  Stairs            Wheelchair Mobility    Modified Rankin (Stroke Patients Only)       Balance Overall balance assessment: Needs assistance Sitting-balance support: No upper extremity supported, Feet supported Sitting balance-Leahy Scale: Good     Standing balance support: Single extremity supported, During functional activity Standing balance-Leahy Scale: Poor Standing balance comment: dynamically requiring minA                             Pertinent Vitals/Pain Pain Assessment Pain Assessment:  No/denies pain    Home Living Family/patient expects to be discharged to:: Private residence Living Arrangements: Spouse/significant other Available Help at Discharge: Family;Available 24 hours/day Type of Home: House Home Access:  Stairs to enter Entrance Stairs-Rails: None Entrance Stairs-Number of Steps: 3   Home Layout: One level Home Equipment: Conservation officer, nature (2 wheels)      Prior Function Prior Level of Function : Independent/Modified Independent             Mobility Comments: amb with no AD ADLs Comments: independent with ADLs     Hand Dominance        Extremity/Trunk Assessment   Upper Extremity Assessment Upper Extremity Assessment: Defer to OT evaluation    Lower Extremity Assessment Lower Extremity Assessment: Generalized weakness    Cervical / Trunk Assessment Cervical / Trunk Assessment: Kyphotic  Communication   Communication: No difficulties  Cognition Arousal/Alertness: Awake/alert Behavior During Therapy: WFL for tasks assessed/performed Overall Cognitive Status: Within Functional Limits for tasks assessed                                 General Comments: wife answering questions for him so at times difficult to assess cognition        General Comments      Exercises     Assessment/Plan    PT Assessment Patient needs continued PT services  PT Problem List Decreased strength;Decreased activity tolerance;Decreased balance;Decreased mobility;Decreased safety awareness       PT Treatment Interventions DME instruction;Functional mobility training;Therapeutic activities;Stair training;Gait training;Therapeutic exercise;Balance training;Patient/family education    PT Goals (Current goals can be found in the Care Plan section)  Acute Rehab PT Goals Patient Stated Goal: to go home PT Goal Formulation: With patient/family Time For Goal Achievement: 04/28/22 Potential to Achieve Goals: Good    Frequency Min 2X/week     Co-evaluation               AM-PAC PT "6 Clicks" Mobility  Outcome Measure Help needed turning from your back to your side while in a flat bed without using bedrails?: A Little Help needed moving from lying on your back to  sitting on the side of a flat bed without using bedrails?: A Little Help needed moving to and from a bed to a chair (including a wheelchair)?: A Little Help needed standing up from a chair using your arms (e.g., wheelchair or bedside chair)?: A Little Help needed to walk in hospital room?: A Little Help needed climbing 3-5 steps with a railing? : A Lot 6 Click Score: 17    End of Session Equipment Utilized During Treatment: Gait belt Activity Tolerance: Patient tolerated treatment well Patient left: in chair;with call bell/phone within reach;with family/visitor present Nurse Communication: Mobility status PT Visit Diagnosis: Unsteadiness on feet (R26.81);Muscle weakness (generalized) (M62.81)    Time: 6767-2094 PT Time Calculation (min) (ACUTE ONLY): 30 min   Charges:   PT Evaluation $PT Eval Low Complexity: 1 Low PT Treatments $Therapeutic Activity: 8-22 mins        Benjie Ricketson A. Gilford Rile PT, DPT [facility] - Acute Rehabilitation Services   Lenell Mcconnell A Adeleigh Barletta 04/14/2022, 11:30 AM

## 2022-04-14 NOTE — Consult Note (Signed)
ANTICOAGULATION CONSULT NOTE  Pharmacy Consult for heparin infusion Indication: chest pain/ACS  No Known Allergies  Patient Measurements: Height: '5\' 7"'$  (170.2 cm) Weight: 74.8 kg (165 lb) IBW/kg (Calculated) : 66.1 Heparin Dosing Weight: 74.8 kg  Vital Signs: Temp: 97.7 F (36.5 C) (01/12 0427) Temp Source: Oral (01/12 0427) BP: 146/54 (01/12 0613) Pulse Rate: 79 (01/12 0613)  Labs: Recent Labs    04/12/22 1730 04/12/22 1918 04/12/22 2047 04/13/22 0430 04/13/22 1014 04/13/22 2023 04/14/22 0609  HGB 13.4  --   --  13.3  --  11.9*  --   HCT 41.7  --   --  39.8  --  36.7*  --   PLT 293  --   --  291  --  257  --   APTT  --   --  109*  --   --   --   --   HEPARINUNFRC  --   --   --  0.28*  --   --  0.33  CREATININE 1.05  --   --  0.98  --   --   --   TROPONINIHS 686* 765*  --  1,111* 1,422*  --   --      Estimated Creatinine Clearance: 55.3 mL/min (by C-G formula based on SCr of 0.98 mg/dL).   Medical History: Past Medical History:  Diagnosis Date   Arthritic-like pain    CAD (coronary artery disease)    a. apical MI 5/07 with LHC showing 50% pCFX, 30% pRCA, 99% pLAD, 99% mLAD, 75% dLAD (cypher DES x3 to LAD); b. ETT-myoview (12/08) 81% MPHR,  EF 64%, partially reversible inferoapical perfusion defect similar to prior study 12/07; c. 04/2016 MV: EF 59%, fixed anteroapical defect, no ischemia->Low risk.   Carotid arterial disease ([place])    a. 05/2016 Carotid U/S: 30-40% bilat dzs, f/u in 2 yrs.   CVA (cerebral vascular accident) ([place])    a. 07/2010 - h/o CVA/TIA.   Diet-controlled type 2 diabetes mellitus (H[facility])    HLD (hyperlipidemia)    HTN (hypertension)    PAT (paroxysmal atrial tachycardia)    a. 06/2016 Event monitor:  rare episodes of atrial tachycardia, longest 7 beats. No afib.    Medications:  Scheduled:   aspirin EC  81 mg Oral Daily   atorvastatin  80 mg Oral Daily   clopidogrel  75 mg Oral Q breakfast   insulin aspart  0-15 Units Subcutaneous TID WC    insulin aspart  0-5 Units Subcutaneous QHS   lisinopril  20 mg Oral Daily   metoprolol tartrate  25 mg Oral BID   pantoprazole  20 mg Oral Daily   sertraline  25 mg Oral Daily   sodium chloride flush  3 mL Intravenous Q12H   Infusions:   sodium chloride     heparin 1,050 Units/hr (04/14/22 0444)   PRN: sodium chloride, acetaminophen, acetaminophen, hydrALAZINE, labetalol, nitroGLYCERIN, ondansetron (ZOFRAN) IV, sodium chloride flush  Assessment: Raymond Sampson is a 82 y.o. male presenting with chest pain. PMH significant for HLD, HTN, CAD, T2DM. Patient was on DAPT (clopidogrel, ASA) PTA. Patient reports taking 4 ASA '81mg'$  tablets (total '324mg'$ ) at home morning of 1/10. Troponins elevated at 686 ng/L on admission. Pharmacy has been consulted to initiate and manage heparin infusion.  Baseline Labs: aPTT 109, Hgb 13.4, Hct 41.7, Plt 293   Date Time HL Rate/Comment  1/11 0430 0.28 1050/slightly subtherapeutic 1/11 1451 --- 0/hold for procedure 1/11 2045 --- 1050/resume per MD  1/12 0609 0.33 Therapeutic x 1   Goal of Therapy:  Heparin level 0.3-0.7 units/ml Monitor platelets by anticoagulation protocol: Yes   Plan:  Continue heparin infusion at 1050 units/hr Check HL 8 hours to confirm  Continue to monitor H&H and platelets daily while on heparin infusion   Renda Rolls, PharmD, [facility] 04/14/2022 6:56 AM

## 2022-04-14 NOTE — H&P (View-Only) (Signed)
Rounding Note    Patient Name: Raymond Sampson Date of Encounter: 04/14/2022  [facility] Cardiologist: Ida Rogue, MD   Subjective   LHC yesterday showed 3V CAD. Plan for repeat LHC with intervention today. No chest pain or shortness of breath reported.   Inpatient Medications    Scheduled Meds:  aspirin EC  81 mg Oral Daily   atorvastatin  80 mg Oral Daily   clopidogrel  75 mg Oral Q breakfast   insulin aspart  0-15 Units Subcutaneous TID WC   insulin aspart  0-5 Units Subcutaneous QHS   lisinopril  20 mg Oral Daily   metoprolol tartrate  25 mg Oral BID   pantoprazole  20 mg Oral Daily   sertraline  25 mg Oral Daily   sodium chloride flush  3 mL Intravenous Q12H   Continuous Infusions:  sodium chloride     heparin 1,050 Units/hr (04/14/22 0444)   PRN Meds: sodium chloride, acetaminophen, acetaminophen, hydrALAZINE, labetalol, nitroGLYCERIN, ondansetron (ZOFRAN) IV, sodium chloride flush   Vital Signs    Vitals:   04/13/22 2110 04/13/22 2246 04/14/22 0427 04/14/22 0613  BP: 108/75 (!) 174/71 (!) 169/64 (!) 146/54  Pulse: 68 74 68 79  Resp:   16   Temp:   97.7 F (36.5 C)   TempSrc:   Oral   SpO2:  92% 93% 97%  Weight:      Height:        Intake/Output Summary (Last 24 hours) at 04/14/2022 0804 Last data filed at 04/14/2022 3329 Gross per 24 hour  Intake 349.72 ml  Output 950 ml  Net -600.28 ml      04/12/2022    5:27 PM 05/31/2021   11:54 AM 08/19/2020    7:54 AM  Last 3 Weights  Weight (lbs) 165 lb 159 lb 2 oz 160 lb  Weight (kg) 74.844 kg 72.179 kg 72.576 kg      Telemetry    NSR, HR 60s, 13 beats NSVT - Personally Reviewed  ECG    No new - Personally Reviewed  Physical Exam   GEN: No acute distress.   Neck: No JVD Cardiac: RRR, no murmurs, rubs, or gallops.  Respiratory: Clear to auscultation bilaterally. GI: Soft, nontender, non-distended  MS: No edema; No deformity. Neuro:  Nonfocal  Psych: Normal affect   Labs     High Sensitivity Troponin:   Recent Labs  Lab 04/12/22 1730 04/12/22 1918 04/13/22 0430 04/13/22 1014  TROPONINIHS 686* 765* 1,111* 1,422*     Chemistry Recent Labs  Lab 04/12/22 1730 04/13/22 0430 04/14/22 0609  NA 136 137 136  K 4.7 3.7 3.6  CL 102 101 104  CO2 25 23 20*  GLUCOSE 135* 156* 235*  BUN '16 15 17  '$ CREATININE 1.05 0.98 0.95  CALCIUM 9.2 8.9 9.0  MG  --   --  1.9  PROT 7.6  --   --   ALBUMIN 4.0  --   --   AST 24  --   --   ALT 17  --   --   ALKPHOS 72  --   --   BILITOT 0.4  --   --   GFRNONAA >60 >60 >60  ANIONGAP '9 13 12    '$ Lipids  Recent Labs  Lab 04/13/22 0430  CHOL 205*  TRIG 257*  HDL 40*  LDLCALC 114*  CHOLHDL 5.1    Hematology Recent Labs  Lab 04/12/22 1730 04/13/22 0430 04/13/22 2023  WBC  9.0 11.7* 9.8  RBC 4.48 4.43 4.08*  HGB 13.4 13.3 11.9*  HCT 41.7 39.8 36.7*  MCV 93.1 89.8 90.0  MCH 29.9 30.0 29.2  MCHC 32.1 33.4 32.4  RDW 12.9 12.7 12.9  PLT 293 291 257   Thyroid No results for input(s): "TSH", "FREET4" in the last 168 hours.  BNPNo results for input(s): "BNP", "PROBNP" in the last 168 hours.  DDimer No results for input(s): "DDIMER" in the last 168 hours.   Radiology    CARDIAC CATHETERIZATION  Result Date: 04/13/2022   --------Severe 3 Vessel CAD--------------   Small caliber-codominant RCA: Prox RCA-1 lesion is 70% stenosis tapers to Prox RCA-2 lesion is 99% stenosed. (Likely Culprit Lesion #1)   Codominant circumflex:  Prox Cx lesion is 55% stenosed.  1st Mrg lesion is 70% stenosed. Just past 1st Mrf, Mid Cx to Dist Cx lesion is 80% stenosed with 70% stenosed side branch in 2nd Mrg.   Prox LAD to Mid LAD lesion is 80% stenosed - focal/concentric.  Mid LAD to Dist LAD long stented segment is 70% stenosed. In-stent restenosis. Dist LAD lesion is 95% stenosed -> involving the distal stent edge into native vessel.   -------------------------------------------   LV end diastolic pressure is moderately elevated.   There  is no aortic valve stenosis. FOR FUTURE CARDIAC CATHETERIZATION, WOULD NOT RECOMMEND USING RIGHT RADIAL ACCESS DUE TO THE EXTREME TORTUOSITY OF THE SUBCLAVIAN-INNOMINATE ARTERY AND AORTIC ARCH. PLAN OF CARE:  Continue with plan to admit overnight.  Will restart IV heparin 2 hours after sheath removal. \  Will need to meet with patient and family as well as have a heart team discussion with interventional cardiology and CVTS to determine best treatment options.  If he were to need PCI, may be best for him to be transferred to [facility] as the procedure would like to be prolonged and difficult.Potential needs to be noted to the patient's mental status, and baseline functional status. Glenetta Hew, MD  ECHOCARDIOGRAM COMPLETE  Result Date: 04/13/2022    ECHOCARDIOGRAM REPORT   Patient Name:   Raymond Sampson Date of Exam: 04/13/2022 Medical Rec #:  [id number]        Height:       67.0 in Accession #:    [id number]       Weight:       165.0 lb Date of Birth:  Nov 14, 1940        BSA:          1.863 m Patient Age:    82 years         BP:           148/84 mmHg Patient Gender: M                HR:           70 bpm. Exam Location:  [facility] Procedure: 2D Echo, Cardiac Doppler and Color Doppler Indications:     NSTEMI I21.4  History:         Patient has no prior history of Echocardiogram examinations.                  CAD; Risk Factors:Hypertension.  Sonographer:     Sherrie Sport Referring Phys:  [id number] [facility] GOEL Diagnosing Phys: Ida Rogue MD  Sonographer Comments: Image quality was fair. IMPRESSIONS  1. Left ventricular ejection fraction, by estimation, is 55 to 60%. The left ventricle has normal function. The left ventricle demonstrates regional wall motion abnormalities (hypokinesis  of teh basal septal region). There is mild left ventricular hypertrophy. Left ventricular diastolic parameters are consistent with Grade I diastolic dysfunction (impaired relaxation).  2. Right ventricular systolic function is normal. The  right ventricular size is normal. There is normal pulmonary artery systolic pressure. The estimated right ventricular systolic pressure is 27.2 mmHg.  3. The mitral valve is normal in structure. No evidence of mitral valve regurgitation. No evidence of mitral stenosis.  4. The aortic valve is normal in structure. Aortic valve regurgitation is mild. Aortic valve sclerosis is present, with no evidence of aortic valve stenosis.  5. The inferior vena cava is normal in size with greater than 50% respiratory variability, suggesting right atrial pressure of 3 mmHg. FINDINGS  Left Ventricle: Left ventricular ejection fraction, by estimation, is 55 to 60%. The left ventricle has normal function. The left ventricle demonstrates regional wall motion abnormalities. The left ventricular internal cavity size was normal in size. There is mild left ventricular hypertrophy. Left ventricular diastolic parameters are consistent with Grade I diastolic dysfunction (impaired relaxation). Right Ventricle: The right ventricular size is normal. No increase in right ventricular wall thickness. Right ventricular systolic function is normal. There is normal pulmonary artery systolic pressure. The tricuspid regurgitant velocity is 1.89 m/s, and  with an assumed right atrial pressure of 5 mmHg, the estimated right ventricular systolic pressure is 53.6 mmHg. Left Atrium: Left atrial size was normal in size. Right Atrium: Right atrial size was normal in size. Pericardium: There is no evidence of pericardial effusion. Mitral Valve: The mitral valve is normal in structure. No evidence of mitral valve regurgitation. No evidence of mitral valve stenosis. Tricuspid Valve: The tricuspid valve is normal in structure. Tricuspid valve regurgitation is mild . No evidence of tricuspid stenosis. Aortic Valve: The aortic valve is normal in structure. Aortic valve regurgitation is mild. Aortic valve sclerosis is present, with no evidence of aortic valve  stenosis. Aortic valve mean gradient measures 4.0 mmHg. Aortic valve peak gradient measures 6.5  mmHg. Aortic valve area, by VTI measures 2.89 cm. Pulmonic Valve: The pulmonic valve was normal in structure. Pulmonic valve regurgitation is not visualized. No evidence of pulmonic stenosis. Aorta: The aortic root is normal in size and structure. Venous: The inferior vena cava is normal in size with greater than 50% respiratory variability, suggesting right atrial pressure of 3 mmHg. IAS/Shunts: No atrial level shunt detected by color flow Doppler.  LEFT VENTRICLE PLAX 2D LVIDd:         3.60 cm   Diastology LVIDs:         2.60 cm   LV e' medial:    5.22 cm/s LV PW:         1.30 cm   LV E/e' medial:  12.5 LV IVS:        0.80 cm   LV e' lateral:   5.33 cm/s LVOT diam:     2.10 cm   LV E/e' lateral: 12.2 LV SV:         71 LV SV Index:   38 LVOT Area:     3.46 cm  RIGHT VENTRICLE RV Basal diam:  3.70 cm RV Mid diam:    2.90 cm RV S prime:     17.10 cm/s LEFT ATRIUM           Index        RIGHT ATRIUM           Index LA diam:      3.50  cm 1.88 cm/m   RA Area:     13.10 cm LA Vol (A2C): 27.0 ml 14.49 ml/m  RA Volume:   29.50 ml  15.83 ml/m LA Vol (A4C): 17.7 ml 9.50 ml/m  AORTIC VALVE AV Area (Vmax):    2.78 cm AV Area (Vmean):   2.72 cm AV Area (VTI):     2.89 cm AV Vmax:           127.00 cm/s AV Vmean:          88.100 cm/s AV VTI:            0.246 m AV Peak Grad:      6.5 mmHg AV Mean Grad:      4.0 mmHg LVOT Vmax:         102.00 cm/s LVOT Vmean:        69.300 cm/s LVOT VTI:          0.205 m LVOT/AV VTI ratio: 0.83  AORTA Ao Root diam: 3.60 cm MITRAL VALVE                TRICUSPID VALVE MV Area (PHT): 3.46 cm     TR Peak grad:   14.3 mmHg MV Decel Time: 219 msec     TR Vmax:        189.00 cm/s MV E velocity: 65.00 cm/s MV A velocity: 108.00 cm/s  SHUNTS MV E/A ratio:  0.60         Systemic VTI:  0.20 m                             Systemic Diam: 2.10 cm Ida Rogue MD Electronically signed by Ida Rogue MD  Signature Date/Time: 04/13/2022/11:37:30 AM    Final    CT Angio Chest PE W and/or Wo Contrast  Result Date: 04/12/2022 CLINICAL DATA:  High probability for pulmonary embolism. Elevated blood pressure. EXAM: CT ANGIOGRAPHY CHEST WITH CONTRAST TECHNIQUE: Multidetector CT imaging of the chest was performed using the standard protocol during bolus administration of intravenous contrast. Multiplanar CT image reconstructions and MIPs were obtained to evaluate the vascular anatomy. RADIATION DOSE REDUCTION: This exam was performed according to the departmental dose-optimization program which includes automated exposure control, adjustment of the mA and/or kV according to patient size and/or use of iterative reconstruction technique. CONTRAST:  49m OMNIPAQUE IOHEXOL 350 MG/ML SOLN COMPARISON:  None Available. FINDINGS: Cardiovascular: Satisfactory opacification of the pulmonary arteries to the segmental level. No evidence of pulmonary embolism. Normal heart size. No pericardial effusion. There are atherosclerotic calcifications of the aorta and coronary arteries. Mediastinum/Nodes: There are mildly enlarged right hilar lymph nodes measuring up to 1 cm. There are nonenlarged mediastinal lymph nodes diffusely. There also nonenlarged left hilar lymph nodes. The esophagus and thyroid gland are within normal limits. Lungs/Pleura: Mild emphysematous changes are present. There are scattered patchy ground-glass opacities throughout both lungs. There is a focal slightly nodular density in the left lung base measuring 2.5 by 1.1 cm. There is no pleural effusion or pneumothorax. Upper Abdomen: There is a low-density right adrenal nodule measuring 16 mm compatible with benign adenoma. Musculoskeletal: No acute fractures are seen. Cervical spinal fusion plate is partially visualized. Review of the MIP images confirms the above findings. IMPRESSION: 1. No evidence for pulmonary embolism. 2. Patchy ground-glass opacities throughout  both lungs may represent infection/inflammation or mild edema. 3. Focal nodular density in the left lung base measuring 2.5 x 1.1 cm.  Per Fleischner Society Guidelines, consider a non-contrast Chest CT at 3 months, a PET/CT, or tissue sampling. These guidelines do not apply to immunocompromised patients and patients with cancer. Follow up in patients with significant comorbidities as clinically warranted. For lung cancer screening, adhere to Lung-RADS guidelines. Reference: Radiology. 2017; 284(1):228-43. Findings may represent focal atelectasis, infection, or neoplasm. 4. Mild right hilar lymphadenopathy. 5. Right adrenal adenoma. Aortic Atherosclerosis (ICD10-I70.0). Electronically Signed   By: Ronney Asters M.D.   On: 04/12/2022 20:58   DG Chest 2 View  Result Date: 04/12/2022 CLINICAL DATA:  Chest pain EXAM: CHEST - 2 VIEW COMPARISON:  Chest radiograph 06/05/2017 FINDINGS: The cardiomediastinal silhouette is normal There is no focal consolidation or pulmonary edema. There is no pleural effusion or pneumothorax There is no acute osseous abnormality. IMPRESSION: No radiographic evidence of acute cardiopulmonary process. Electronically Signed   By: Valetta Mole M.D.   On: 04/12/2022 18:11    Cardiac Studies   LHC 04/13/21   --------Severe 3 Vessel CAD--------------   Small caliber-codominant RCA: Prox RCA-1 lesion is 70% stenosis tapers to Prox RCA-2 lesion is 99% stenosed. (Likely Culprit Lesion #1)   Codominant circumflex:  Prox Cx lesion is 55% stenosed.  1st Mrg lesion is 70% stenosed. Just past 1st Mrf, Mid Cx to Dist Cx lesion is 80% stenosed with 70% stenosed side branch in 2nd Mrg.   Prox LAD to Mid LAD lesion is 80% stenosed - focal/concentric.  Mid LAD to Dist LAD long stented segment is 70% stenosed. In-stent restenosis. Dist LAD lesion is 95% stenosed -> involving the distal stent edge into native vessel.   -------------------------------------------   LV end diastolic pressure is  moderately elevated.   There is no aortic valve stenosis.     FOR FUTURE CARDIAC CATHETERIZATION, WOULD NOT RECOMMEND USING RIGHT RADIAL ACCESS DUE TO THE EXTREME TORTUOSITY OF THE SUBCLAVIAN-INNOMINATE ARTERY AND AORTIC ARCH.   PLAN OF CARE:  Continue with plan to admit overnight.  Will restart IV heparin 2 hours after sheath removal. \            Will need to meet with patient and family as well as have a heart team discussion with interventional cardiology and CVTS to determine best treatment options.  If he were to need PCI, may be best for him to be transferred to [facility] as the procedure would like to be prolonged and difficult.Potential needs to be noted to the patient's mental status, and baseline functional status.       Glenetta Hew, MD     Recommendations  Antiplatelet/Anticoag Restart IV heparin 2 hours after sheath removal. Determine best course of action going forward whether this is going to be multivessel PCI, medical management versus potential CABG.  Once that decision is made, with an initiate appropriate antiplatelet agent which would be either 81 mg aspirin plus Thienopyridine (likely Plavix) if the option is PCI or medical management.  If CABG, then simply 81 mg aspirin.  Discharge Date In the absence of any other complications or medical issues, we expect the patient to be ready for discharge from a cath perspective. Anticipated discharge date to be determined.    Echo 04/13/21  1. Left ventricular ejection fraction, by estimation, is 55 to 60%. The  left ventricle has normal function. The left ventricle demonstrates  regional wall motion abnormalities (hypokinesis of teh basal septal  region). There is mild left ventricular  hypertrophy. Left ventricular diastolic parameters are consistent with  Grade I diastolic  dysfunction (impaired relaxation).   2. Right ventricular systolic function is normal. The right ventricular  size is normal. There is normal  pulmonary artery systolic pressure. The  estimated right ventricular systolic pressure is 89.1 mmHg.   3. The mitral valve is normal in structure. No evidence of mitral valve  regurgitation. No evidence of mitral stenosis.   4. The aortic valve is normal in structure. Aortic valve regurgitation is  mild. Aortic valve sclerosis is present, with no evidence of aortic valve  stenosis.   5. The inferior vena cava is normal in size with greater than 50%  respiratory variability, suggesting right atrial pressure of 3 mmHg.   Patient Profile     82 y.o. male  with a hx of CAD with apical MI 12/06/2005 status post stenting x 3 to the LAD, TIA/CVA in April 2012, atrial tachycardia, bilateral carotid artery disease, dementia, diabetes type 2, hypertension, hyperlipidemia, prior tobacco use who is being seen 04/13/2022 for the evaluation of NSTEMI    Assessment & Plan    Non-STEMI CAD s/p remote stenting in 2007 - presented with sharp chest pain improved with ASA - HS trop up to 1,422 - IV heparin - echo showed LVEF 55-60%, mild LVH, G1DD, mild AI - continue PTA ASA '81mg'$  daily and Plavix '75mg'$  daily - conitnue Lipitor '80mg'$  dailym lisinopril '20mg'$  daily and Lopressor '25mg'$  BID - kidney function is stable - LHC showed 3V CAD with plan to discuss best treatment options. After discussion, plan for repeat cath with intervention today. Risks and benefits of cardiac catheterization have been discussed with the patient.  These include bleeding, infection, kidney damage, stroke, heart attack, death.  The patient understands these risks and is willing to proceed.   H/o COVID-19 - per IM   Carotid artery disease - Carotid US in 2020 showed 1-39% disease - continue Apsirin and statin therapy   HTN - BP severely elevated on admission - PTA amlodipine '5mg'$  daily, lisinopril '20mg'$  daily, Lopressor '25mg'$  BID. Amlodipine held on admission - BP still elevated   Atrial tachycardia - continue with telemetry    HLD - TG 257, HDL 40, LDL 114 - PTA simvastatin>changed to Lipitor '80mg'$  daily  For questions or updates, please contact [place] Please consult [email or web address] for contact info under        Signed, Roxy Mastandrea Ninfa Meeker, PA-C  04/14/2022, 8:04 AM

## 2022-04-14 NOTE — Assessment & Plan Note (Signed)
Delirium precautions 

## 2022-04-14 NOTE — Heart Team MDD (Signed)
   Heart Team Multi-Disciplinary Discussion  Patient: Raymond Sampson  DOB: 1940-04-14  MRN: [id number]   Date: 04/14/2022  12:11 PM    Attendees: Interventional Cardiology: Glenetta Hew, MD Adrian Prows, MD Peter Martinique, MD Quay Burow, MD Kathlyn Sacramento, MD Bing Quarry, MD Nelva Bush, MD Sherren Mocha, MD    Additional Attendees: Jerilynn Mages. Skains   Patient History: CAD (coronary artery disease) Carotid arterial disease     a. 05/2016 Carotid U/S: 30-40% bilat dzs, f/u in 2 yrs.  CVA (cerebral vascular accident)     a. 07/2010 - h/o CVA/TIA. Diet-controlled type 2 diabetes mellitus   HLD (hyperlipidemia)   HTN (hypertension)   PAT (paroxysmal atrial tachycardia)  Pt admitted for NSTEMI on 1/10//2024 with c/o intermittent chest pain and elevated BP. Pt underwent heart cath by Dr. Ellyn Hack on 04/13/2022. See notes below.    Risk Factors: Diabetes Mellitus Hypertension History of Stroke or TIA Hyperlipidemia Additional Risk Factors: Dementia   CAD History: From Cath on 04/13/2022:  Severe multivessel CAD:  Small caliber codominant RCA with 70% stenosis tapering to a 95% focal lesion.  Vessel gives rise to a small caliber  Large LAD with several small diagonal branches.  There is a proximal 80% calcified focal lesion (concentric).  Roughly 20 mm downstream there is a very long stented segment over 50 mm that is diffusely diseased, in-stent ISR up to 75%.  At the distal edge of the stent there is a 95% lesion involving the distal edge and into the native vessel.  The distal LAD wraps the apex.  Large dominant/codominant LCx with proximal 60% stenosis prior to giving off OM1 which has ostial 70% lesion.  Beyond OM1, the mid LCx has 80% stenosis prior to giving off the AV groove LCx.  The follow on LCx-OM 2 has 70% stenosis.  The AV groove beyond OM2 is relatively free of disease and gives rise to 2 major posterolateral branches as well as a possible small  PDA. Normal EF by echo with no regional wall motion normality.    Review of Prior Angiography and PCI Procedures: Recent Echocardiogram and Heart cath were reviewed and discussed in detail with the group.    Discussion: After presentation, consideration of treatment options occurred, contrasting PCI attempt versus CABG, versus medical management. Relative to CABG, pt was not considered to be a surgical candidate due to age, dementia and current stenting to LAD. Relative to PCI, the group recommended a femoral approach and to treat the RCA first, then LAD and at a later time, consider PCI to Cx. Regarding the LAD, it was discussed to balloon the entire stented area of the LAD and to add a stent to the prox LAD if possible.    Recommendations: PCI      Dan Europe, RN  04/14/2022 12:11 PM

## 2022-04-14 NOTE — Evaluation (Signed)
Occupational Therapy Evaluation Patient Details Name: Raymond Sampson MRN: [id number] DOB: [birth year] Today's Date: 04/14/2022   History of Present Illness 82 y/o male presented to ED on 04/12/22 for chest pain intermittently over past week. Admitted for NSTEMI. S/p L heart cath on 1/11. PMH: HTN, hx of MI, CAD, hx of CVA   Clinical Impression   Patient presenting with decreased Ind in self care,balance, functional mobility/transfers, endurance, and safety awareness. Patient reports living at home with wife and being independent without use of AD for self care and mobility. Pt drives locally. Pt needing increased time to process information and often looking to wife to answer questions asked of therapist in regard to home set up and equipment. Pt has RW at home but he does not utilize. Patient currently functioning at min guard - min A while pushing IV pole and demonstrating ambulation to bathroom and toileting. Pt needing cues for safety awareness and does fatigue quickly. Scheduled for heart cath later today. OT recommended use of RW at home and [facility]. Patient will benefit from acute OT to increase overall independence in the areas of ADLs, functional mobility, and safety awareness in order to safely discharge home with wife.      Recommendations for follow up therapy are one component of a multi-disciplinary discharge planning process, led by the attending physician.  Recommendations may be updated based on patient status, additional functional criteria and insurance authorization.   Follow Up Recommendations  No OT follow up     Assistance Recommended at Discharge Intermittent Supervision/Assistance  Patient can return home with the following A little help with walking and/or transfers;A little help with bathing/dressing/bathroom;Help with stairs or ramp for entrance;Assist for transportation;Direct supervision/assist for financial management;Direct supervision/assist for medications  management;Assistance with cooking/housework    Functional Status Assessment  Patient has had a recent decline in their functional status and demonstrates the ability to make significant improvements in function in a reasonable and predictable amount of time.  Equipment Recommendations  BSC/3in1;Other (comment) (recommended pt use RW for ambulation)       Precautions / Restrictions Precautions Precautions: Fall Restrictions Weight Bearing Restrictions: No      Mobility Bed Mobility Overal bed mobility: Needs Assistance Bed Mobility: Supine to Sit, Sit to Supine     Supine to sit: Min assist Sit to supine: Min assist   General bed mobility comments: assistance for trunk support to EOB    Transfers Overall transfer level: Needs assistance Equipment used: None Transfers: Sit to/from Stand Sit to Stand: Min guard                  Balance Overall balance assessment: Needs assistance Sitting-balance support: No upper extremity supported, Feet supported Sitting balance-Leahy Scale: Good     Standing balance support: Single extremity supported, During functional activity Standing balance-Leahy Scale: Poor Standing balance comment: dynamically requiring minA                           ADL either performed or assessed with clinical judgement   ADL Overall ADL's : Needs assistance/impaired                         Toilet Transfer: Moderate assistance;Regular Toilet;Ambulation;Grab bars Toilet Transfer Details (indicate cue type and reason): mod lifting assistance to stand from commode Toileting- Clothing Manipulation and Hygiene: Min guard;Minimal assistance;Sit to/from stand       Functional mobility during ADLs:  Min guard;Minimal assistance       Vision Patient Visual Report: No change from baseline              Pertinent Vitals/Pain Pain Assessment Pain Assessment: No/denies pain     Hand Dominance Right   Extremity/Trunk  Assessment Upper Extremity Assessment Upper Extremity Assessment: Generalized weakness   Lower Extremity Assessment Lower Extremity Assessment: Generalized weakness   Cervical / Trunk Assessment Cervical / Trunk Assessment: Kyphotic   Communication Communication Communication: No difficulties   Cognition Arousal/Alertness: Awake/alert Behavior During Therapy: WFL for tasks assessed/performed Overall Cognitive Status: History of cognitive impairments - at baseline                                 General Comments: Slow processing information and pt looks to wife for answers when he is unsure.                Home Living Family/patient expects to be discharged to:: Private residence Living Arrangements: Spouse/significant other Available Help at Discharge: Family;Available 24 hours/day Type of Home: House Home Access: Stairs to enter [employer] of Steps: 3 Entrance Stairs-Rails: None Home Layout: One level     Bathroom Shower/Tub: Teacher, early years/pre: Standard     Home Equipment: Conservation officer, nature (2 wheels)          Prior Functioning/Environment Prior Level of Function : Independent/Modified Independent             Mobility Comments: amb with no AD ADLs Comments: independent with ADLs        OT Problem List: Decreased strength;Decreased activity tolerance;Decreased safety awareness;Impaired balance (sitting and/or standing);Decreased knowledge of use of DME or AE      OT Treatment/Interventions: Self-care/ADL training;Therapeutic exercise;Therapeutic activities;Manual therapy;Balance training;Patient/family education;Energy conservation    OT Goals(Current goals can be found in the care plan section) Acute Rehab OT Goals Patient Stated Goal: to feel better and go home OT Goal Formulation: With patient/family Time For Goal Achievement: 04/28/22 Potential to Achieve Goals: Good ADL Goals Pt Will Perform Grooming:  with modified independence;standing Pt Will Perform Lower Body Dressing: with supervision;sit to/from stand Pt Will Transfer to Toilet: with supervision;ambulating Pt Will Perform Toileting - Clothing Manipulation and hygiene: with supervision;sit to/from stand  OT Frequency: Min 2X/week       AM-PAC OT "6 Clicks" Daily Activity     Outcome Measure Help from another person eating meals?: None Help from another person taking care of personal grooming?: A Little Help from another person toileting, which includes using toliet, bedpan, or urinal?: A Little Help from another person bathing (including washing, rinsing, drying)?: A Little Help from another person to put on and taking off regular upper body clothing?: None Help from another person to put on and taking off regular lower body clothing?: A Little 6 Click Score: 20   End of Session Nurse Communication: Mobility status  Activity Tolerance: Patient tolerated treatment well Patient left: in bed;with call bell/phone within reach;with bed alarm set;with family/visitor present  OT Visit Diagnosis: Unsteadiness on feet (R26.81);Repeated falls (R29.6);Muscle weakness (generalized) (M62.81)                Time: 1040-1101 OT Time Calculation (min): 21 min Charges:  OT General Charges $OT Visit: 1 Visit OT Evaluation $OT Eval Moderate Complexity: 1 Mod OT Treatments $Self Care/Home Management : 8-22 mins  Darleen Crocker, MS, OTR/L , CBIS ascom  [phone]  04/14/22, 11:51 AM

## 2022-04-14 NOTE — Progress Notes (Signed)
  Progress Note   Patient: Raymond Sampson HER:[id number] DOB: 1940-05-27 DOA: 04/12/2022     1 DOS: the patient was seen and examined on 04/14/2022   Brief hospital course: Raymond Sampson is a 82 y.o. male with history of CAD with MI 12/06/2005 status post stenting x 3 to the LAD, TIA/CVA in April 2012, atrial tachycardia, bilateral carotid artery disease, dementia, diabetes type 2, hypertension, hyperlipidemia, prior tobacco use who presented to the ED on 04/12/2022 for evaluation of chest pain.   Pain was notably worse with exertion, non-radiatin, substernal and mild to moderate in intensity.  ED evaluation was concerning for Non-STEMI with hs-troponin elevation with trend upwards (686 >> 765 >> 1111 >> 1422).  He was admitted to hospital, started on IV heparin and Cardiology consulted.   1/11 - cardiology plans for left heart cath this afternoon.  Pt denies active chest pain overnight or this AM.  Assessment and Plan: * NSTEMI (non-ST elevated myocardial infarction) ([place]) Mgmt per Cardiology. Initial Cath 1/11 showed triple vessel disease. Staged PCI planned, return to cath lab today. Continue heparin Follow up pending Echo. Continue metoprolol, statin, lisinopril, ASA  Mild dementia ([place]) Delirium precautions  Lung nodule Repeat CT scan in 3 months per radiology recommendation. Finds possibly COVID-19 sequelae. PCP to follow up.  History of COVID-19 Recent COVID-19 infection, diagnosed 03/31/2022. CT findings are likely sequelae. No hypoxia or respiratory complaints.   Chest pain POA, resolved. Mgmt at outlined and per cardiology. See NSTEMI.  Essential hypertension BP's elevated on admission 198/76 initially in ED. SBP ranging 140--188 today. Resumed lisinopril, metoprolol. Continue IV labetalol or hydralazine PRN        Subjective: Pt see with wife at bedside.Pt reports feeling okay.  Denies recurrence of chest pain.  He and wife hopeful for staged PCI but  agreeable to transfer to [facility] should that be necessary.  No other acute complaints or acute events reported.  Physical Exam: Vitals:   04/14/22 0613 04/14/22 0805 04/14/22 1309 04/14/22 1340  BP: (!) 146/54 (!) 165/72 (!) 156/76 (!) 158/67  Pulse: 79 73 67 63  Resp:  '16 16 14  '$ Temp:  97.7 F (36.5 C) 97.7 F (36.5 C) 98.2 F (36.8 C)  TempSrc:  Oral Oral Oral  SpO2: 97% 95% 93% 94%  Weight:      Height:       General exam: awake, alert, no acute distres HEENT: moist mucus membranes, hearing grossly normal  Respiratory system: CTAB no wheezes, rales or rhonchi, normal respiratory effort. Cardiovascular system: normal S1/S2, RRR, no JVD, murmurs, rubs, gallops,  no pedal edema.   Gastrointestinal system: soft, NT, ND, no HSM felt, +bowel sounds. Central nervous system: A&O x3. no gross focal neurologic deficits, normal speech Extremities: moves all , no edema, normal tone Skin: dry, intact, normal temperature, normal color, No rashes, lesions or ulcers Psychiatry: normal mood, congruent affect, judgement and insight appear normal   Data Reviewed:  Notable labs --- bicarb 20, glucose 235.    0  Left Heart Cath yesterday 1/11 --- 3 vessel disease, no PCI performed.    Family Communication: wife at bedside on rounds  Disposition: Status is: Inpatient Remains inpatient appropriate because: ongoing evaluation with cardiac cath planned, remains on IV heparin.  D/C pending cardiology clearance.   Planned Discharge Destination: Home    Time spent: 35 minutes  Author: Ezekiel Slocumb, DO 04/14/2022 2:40 PM  For on call review [email or web address].

## 2022-04-14 NOTE — Interval H&P Note (Signed)
History and Physical Interval Note:  04/14/2022 3:37 PM  Raymond Sampson  has presented today for surgery, with the diagnosis of NSTEMI.  The various methods of treatment have been discussed with the patient and family. After consideration of risks, benefits and other options for treatment, the patient has consented to  Procedure(s): LEFT HEART CATH AND CORONARY ANGIOGRAPHY (N/A) as a surgical intervention.  The patient's history has been reviewed, patient examined, no change in status, stable for surgery.  I have reviewed the patient's chart and labs.  Questions were answered to the patient's satisfaction.    Cath Lab Visit (complete for each Cath Lab visit)  Clinical Evaluation Leading to the Procedure:   ACS: Yes.    Non-ACS:  N/A  Keionna Kinnaird

## 2022-04-14 NOTE — Progress Notes (Signed)
Rounding Note    Patient Name: Raymond Sampson Date of Encounter: 04/14/2022  [facility] Cardiologist: Ida Rogue, MD   Subjective   LHC yesterday showed 3V CAD. Plan for repeat LHC with intervention today. No chest pain or shortness of breath reported.   Inpatient Medications    Scheduled Meds:  aspirin EC  81 mg Oral Daily   atorvastatin  80 mg Oral Daily   clopidogrel  75 mg Oral Q breakfast   insulin aspart  0-15 Units Subcutaneous TID WC   insulin aspart  0-5 Units Subcutaneous QHS   lisinopril  20 mg Oral Daily   metoprolol tartrate  25 mg Oral BID   pantoprazole  20 mg Oral Daily   sertraline  25 mg Oral Daily   sodium chloride flush  3 mL Intravenous Q12H   Continuous Infusions:  sodium chloride     heparin 1,050 Units/hr (04/14/22 0444)   PRN Meds: sodium chloride, acetaminophen, acetaminophen, hydrALAZINE, labetalol, nitroGLYCERIN, ondansetron (ZOFRAN) IV, sodium chloride flush   Vital Signs    Vitals:   04/13/22 2110 04/13/22 2246 04/14/22 0427 04/14/22 0613  BP: 108/75 (!) 174/71 (!) 169/64 (!) 146/54  Pulse: 68 74 68 79  Resp:   16   Temp:   97.7 F (36.5 C)   TempSrc:   Oral   SpO2:  92% 93% 97%  Weight:      Height:        Intake/Output Summary (Last 24 hours) at 04/14/2022 0804 Last data filed at 04/14/2022 8850 Gross per 24 hour  Intake 349.72 ml  Output 950 ml  Net -600.28 ml      04/12/2022    5:27 PM 05/31/2021   11:54 AM 08/19/2020    7:54 AM  Last 3 Weights  Weight (lbs) 165 lb 159 lb 2 oz 160 lb  Weight (kg) 74.844 kg 72.179 kg 72.576 kg      Telemetry    NSR, HR 60s, 13 beats NSVT - Personally Reviewed  ECG    No new - Personally Reviewed  Physical Exam   GEN: No acute distress.   Neck: No JVD Cardiac: RRR, no murmurs, rubs, or gallops.  Respiratory: Clear to auscultation bilaterally. GI: Soft, nontender, non-distended  MS: No edema; No deformity. Neuro:  Nonfocal  Psych: Normal affect   Labs     High Sensitivity Troponin:   Recent Labs  Lab 04/12/22 1730 04/12/22 1918 04/13/22 0430 04/13/22 1014  TROPONINIHS 686* 765* 1,111* 1,422*     Chemistry Recent Labs  Lab 04/12/22 1730 04/13/22 0430 04/14/22 0609  NA 136 137 136  K 4.7 3.7 3.6  CL 102 101 104  CO2 25 23 20*  GLUCOSE 135* 156* 235*  BUN '16 15 17  '$ CREATININE 1.05 0.98 0.95  CALCIUM 9.2 8.9 9.0  MG  --   --  1.9  PROT 7.6  --   --   ALBUMIN 4.0  --   --   AST 24  --   --   ALT 17  --   --   ALKPHOS 72  --   --   BILITOT 0.4  --   --   GFRNONAA >60 >60 >60  ANIONGAP '9 13 12    '$ Lipids  Recent Labs  Lab 04/13/22 0430  CHOL 205*  TRIG 257*  HDL 40*  LDLCALC 114*  CHOLHDL 5.1    Hematology Recent Labs  Lab 04/12/22 1730 04/13/22 0430 04/13/22 2023  WBC  9.0 11.7* 9.8  RBC 4.48 4.43 4.08*  HGB 13.4 13.3 11.9*  HCT 41.7 39.8 36.7*  MCV 93.1 89.8 90.0  MCH 29.9 30.0 29.2  MCHC 32.1 33.4 32.4  RDW 12.9 12.7 12.9  PLT 293 291 257   Thyroid No results for input(s): "TSH", "FREET4" in the last 168 hours.  BNPNo results for input(s): "BNP", "PROBNP" in the last 168 hours.  DDimer No results for input(s): "DDIMER" in the last 168 hours.   Radiology    CARDIAC CATHETERIZATION  Result Date: 04/13/2022   --------Severe 3 Vessel CAD--------------   Small caliber-codominant RCA: Prox RCA-1 lesion is 70% stenosis tapers to Prox RCA-2 lesion is 99% stenosed. (Likely Culprit Lesion #1)   Codominant circumflex:  Prox Cx lesion is 55% stenosed.  1st Mrg lesion is 70% stenosed. Just past 1st Mrf, Mid Cx to Dist Cx lesion is 80% stenosed with 70% stenosed side branch in 2nd Mrg.   Prox LAD to Mid LAD lesion is 80% stenosed - focal/concentric.  Mid LAD to Dist LAD long stented segment is 70% stenosed. In-stent restenosis. Dist LAD lesion is 95% stenosed -> involving the distal stent edge into native vessel.   -------------------------------------------   LV end diastolic pressure is moderately elevated.   There  is no aortic valve stenosis. FOR FUTURE CARDIAC CATHETERIZATION, WOULD NOT RECOMMEND USING RIGHT RADIAL ACCESS DUE TO THE EXTREME TORTUOSITY OF THE SUBCLAVIAN-INNOMINATE ARTERY AND AORTIC ARCH. PLAN OF CARE:  Continue with plan to admit overnight.  Will restart IV heparin 2 hours after sheath removal. \  Will need to meet with patient and family as well as have a heart team discussion with interventional cardiology and CVTS to determine best treatment options.  If he were to need PCI, may be best for him to be transferred to [facility] as the procedure would like to be prolonged and difficult.Potential needs to be noted to the patient's mental status, and baseline functional status. Glenetta Hew, MD  ECHOCARDIOGRAM COMPLETE  Result Date: 04/13/2022    ECHOCARDIOGRAM REPORT   Patient Name:   Raymond Sampson Date of Exam: 04/13/2022 Medical Rec #:  [id number]        Height:       67.0 in Accession #:    [id number]       Weight:       165.0 lb Date of Birth:  [birth year]        BSA:          1.863 m Patient Age:    82 years         BP:           148/84 mmHg Patient Gender: M                HR:           70 bpm. Exam Location:  [facility] Procedure: 2D Echo, Cardiac Doppler and Color Doppler Indications:     NSTEMI I21.4  History:         Patient has no prior history of Echocardiogram examinations.                  CAD; Risk Factors:Hypertension.  Sonographer:     Sherrie Sport Referring Phys:  [id number] [facility] GOEL Diagnosing Phys: Ida Rogue MD  Sonographer Comments: Image quality was fair. IMPRESSIONS  1. Left ventricular ejection fraction, by estimation, is 55 to 60%. The left ventricle has normal function. The left ventricle demonstrates regional wall motion abnormalities (hypokinesis  of teh basal septal region). There is mild left ventricular hypertrophy. Left ventricular diastolic parameters are consistent with Grade I diastolic dysfunction (impaired relaxation).  2. Right ventricular systolic function is normal. The  right ventricular size is normal. There is normal pulmonary artery systolic pressure. The estimated right ventricular systolic pressure is 42.6 mmHg.  3. The mitral valve is normal in structure. No evidence of mitral valve regurgitation. No evidence of mitral stenosis.  4. The aortic valve is normal in structure. Aortic valve regurgitation is mild. Aortic valve sclerosis is present, with no evidence of aortic valve stenosis.  5. The inferior vena cava is normal in size with greater than 50% respiratory variability, suggesting right atrial pressure of 3 mmHg. FINDINGS  Left Ventricle: Left ventricular ejection fraction, by estimation, is 55 to 60%. The left ventricle has normal function. The left ventricle demonstrates regional wall motion abnormalities. The left ventricular internal cavity size was normal in size. There is mild left ventricular hypertrophy. Left ventricular diastolic parameters are consistent with Grade I diastolic dysfunction (impaired relaxation). Right Ventricle: The right ventricular size is normal. No increase in right ventricular wall thickness. Right ventricular systolic function is normal. There is normal pulmonary artery systolic pressure. The tricuspid regurgitant velocity is 1.89 m/s, and  with an assumed right atrial pressure of 5 mmHg, the estimated right ventricular systolic pressure is 83.4 mmHg. Left Atrium: Left atrial size was normal in size. Right Atrium: Right atrial size was normal in size. Pericardium: There is no evidence of pericardial effusion. Mitral Valve: The mitral valve is normal in structure. No evidence of mitral valve regurgitation. No evidence of mitral valve stenosis. Tricuspid Valve: The tricuspid valve is normal in structure. Tricuspid valve regurgitation is mild . No evidence of tricuspid stenosis. Aortic Valve: The aortic valve is normal in structure. Aortic valve regurgitation is mild. Aortic valve sclerosis is present, with no evidence of aortic valve  stenosis. Aortic valve mean gradient measures 4.0 mmHg. Aortic valve peak gradient measures 6.5  mmHg. Aortic valve area, by VTI measures 2.89 cm. Pulmonic Valve: The pulmonic valve was normal in structure. Pulmonic valve regurgitation is not visualized. No evidence of pulmonic stenosis. Aorta: The aortic root is normal in size and structure. Venous: The inferior vena cava is normal in size with greater than 50% respiratory variability, suggesting right atrial pressure of 3 mmHg. IAS/Shunts: No atrial level shunt detected by color flow Doppler.  LEFT VENTRICLE PLAX 2D LVIDd:         3.60 cm   Diastology LVIDs:         2.60 cm   LV e' medial:    5.22 cm/s LV PW:         1.30 cm   LV E/e' medial:  12.5 LV IVS:        0.80 cm   LV e' lateral:   5.33 cm/s LVOT diam:     2.10 cm   LV E/e' lateral: 12.2 LV SV:         71 LV SV Index:   38 LVOT Area:     3.46 cm  RIGHT VENTRICLE RV Basal diam:  3.70 cm RV Mid diam:    2.90 cm RV S prime:     17.10 cm/s LEFT ATRIUM           Index        RIGHT ATRIUM           Index LA diam:      3.50  cm 1.88 cm/m   RA Area:     13.10 cm LA Vol (A2C): 27.0 ml 14.49 ml/m  RA Volume:   29.50 ml  15.83 ml/m LA Vol (A4C): 17.7 ml 9.50 ml/m  AORTIC VALVE AV Area (Vmax):    2.78 cm AV Area (Vmean):   2.72 cm AV Area (VTI):     2.89 cm AV Vmax:           127.00 cm/s AV Vmean:          88.100 cm/s AV VTI:            0.246 m AV Peak Grad:      6.5 mmHg AV Mean Grad:      4.0 mmHg LVOT Vmax:         102.00 cm/s LVOT Vmean:        69.300 cm/s LVOT VTI:          0.205 m LVOT/AV VTI ratio: 0.83  AORTA Ao Root diam: 3.60 cm MITRAL VALVE                TRICUSPID VALVE MV Area (PHT): 3.46 cm     TR Peak grad:   14.3 mmHg MV Decel Time: 219 msec     TR Vmax:        189.00 cm/s MV E velocity: 65.00 cm/s MV A velocity: 108.00 cm/s  SHUNTS MV E/A ratio:  0.60         Systemic VTI:  0.20 m                             Systemic Diam: 2.10 cm Ida Rogue MD Electronically signed by Ida Rogue MD  Signature Date/Time: 04/13/2022/11:37:30 AM    Final    CT Angio Chest PE W and/or Wo Contrast  Result Date: 04/12/2022 CLINICAL DATA:  High probability for pulmonary embolism. Elevated blood pressure. EXAM: CT ANGIOGRAPHY CHEST WITH CONTRAST TECHNIQUE: Multidetector CT imaging of the chest was performed using the standard protocol during bolus administration of intravenous contrast. Multiplanar CT image reconstructions and MIPs were obtained to evaluate the vascular anatomy. RADIATION DOSE REDUCTION: This exam was performed according to the departmental dose-optimization program which includes automated exposure control, adjustment of the mA and/or kV according to patient size and/or use of iterative reconstruction technique. CONTRAST:  12m OMNIPAQUE IOHEXOL 350 MG/ML SOLN COMPARISON:  None Available. FINDINGS: Cardiovascular: Satisfactory opacification of the pulmonary arteries to the segmental level. No evidence of pulmonary embolism. Normal heart size. No pericardial effusion. There are atherosclerotic calcifications of the aorta and coronary arteries. Mediastinum/Nodes: There are mildly enlarged right hilar lymph nodes measuring up to 1 cm. There are nonenlarged mediastinal lymph nodes diffusely. There also nonenlarged left hilar lymph nodes. The esophagus and thyroid gland are within normal limits. Lungs/Pleura: Mild emphysematous changes are present. There are scattered patchy ground-glass opacities throughout both lungs. There is a focal slightly nodular density in the left lung base measuring 2.5 by 1.1 cm. There is no pleural effusion or pneumothorax. Upper Abdomen: There is a low-density right adrenal nodule measuring 16 mm compatible with benign adenoma. Musculoskeletal: No acute fractures are seen. Cervical spinal fusion plate is partially visualized. Review of the MIP images confirms the above findings. IMPRESSION: 1. No evidence for pulmonary embolism. 2. Patchy ground-glass opacities throughout  both lungs may represent infection/inflammation or mild edema. 3. Focal nodular density in the left lung base measuring 2.5 x 1.1 cm.  Per Fleischner Society Guidelines, consider a non-contrast Chest CT at 3 months, a PET/CT, or tissue sampling. These guidelines do not apply to immunocompromised patients and patients with cancer. Follow up in patients with significant comorbidities as clinically warranted. For lung cancer screening, adhere to Lung-RADS guidelines. Reference: Radiology. 2017; 284(1):228-43. Findings may represent focal atelectasis, infection, or neoplasm. 4. Mild right hilar lymphadenopathy. 5. Right adrenal adenoma. Aortic Atherosclerosis (ICD10-I70.0). Electronically Signed   By: Ronney Asters M.D.   On: 04/12/2022 20:58   DG Chest 2 View  Result Date: 04/12/2022 CLINICAL DATA:  Chest pain EXAM: CHEST - 2 VIEW COMPARISON:  Chest radiograph 06/05/2017 FINDINGS: The cardiomediastinal silhouette is normal There is no focal consolidation or pulmonary edema. There is no pleural effusion or pneumothorax There is no acute osseous abnormality. IMPRESSION: No radiographic evidence of acute cardiopulmonary process. Electronically Signed   By: Valetta Mole M.D.   On: 04/12/2022 18:11    Cardiac Studies   LHC 04/13/21   --------Severe 3 Vessel CAD--------------   Small caliber-codominant RCA: Prox RCA-1 lesion is 70% stenosis tapers to Prox RCA-2 lesion is 99% stenosed. (Likely Culprit Lesion #1)   Codominant circumflex:  Prox Cx lesion is 55% stenosed.  1st Mrg lesion is 70% stenosed. Just past 1st Mrf, Mid Cx to Dist Cx lesion is 80% stenosed with 70% stenosed side branch in 2nd Mrg.   Prox LAD to Mid LAD lesion is 80% stenosed - focal/concentric.  Mid LAD to Dist LAD long stented segment is 70% stenosed. In-stent restenosis. Dist LAD lesion is 95% stenosed -> involving the distal stent edge into native vessel.   -------------------------------------------   LV end diastolic pressure is  moderately elevated.   There is no aortic valve stenosis.     FOR FUTURE CARDIAC CATHETERIZATION, WOULD NOT RECOMMEND USING RIGHT RADIAL ACCESS DUE TO THE EXTREME TORTUOSITY OF THE SUBCLAVIAN-INNOMINATE ARTERY AND AORTIC ARCH.   PLAN OF CARE:  Continue with plan to admit overnight.  Will restart IV heparin 2 hours after sheath removal. \            Will need to meet with patient and family as well as have a heart team discussion with interventional cardiology and CVTS to determine best treatment options.  If he were to need PCI, may be best for him to be transferred to [facility] as the procedure would like to be prolonged and difficult.Potential needs to be noted to the patient's mental status, and baseline functional status.       Glenetta Hew, MD     Recommendations  Antiplatelet/Anticoag Restart IV heparin 2 hours after sheath removal. Determine best course of action going forward whether this is going to be multivessel PCI, medical management versus potential CABG.  Once that decision is made, with an initiate appropriate antiplatelet agent which would be either 81 mg aspirin plus Thienopyridine (likely Plavix) if the option is PCI or medical management.  If CABG, then simply 81 mg aspirin.  Discharge Date In the absence of any other complications or medical issues, we expect the patient to be ready for discharge from a cath perspective. Anticipated discharge date to be determined.    Echo 04/13/21  1. Left ventricular ejection fraction, by estimation, is 55 to 60%. The  left ventricle has normal function. The left ventricle demonstrates  regional wall motion abnormalities (hypokinesis of teh basal septal  region). There is mild left ventricular  hypertrophy. Left ventricular diastolic parameters are consistent with  Grade I diastolic  dysfunction (impaired relaxation).   2. Right ventricular systolic function is normal. The right ventricular  size is normal. There is normal  pulmonary artery systolic pressure. The  estimated right ventricular systolic pressure is 17.5 mmHg.   3. The mitral valve is normal in structure. No evidence of mitral valve  regurgitation. No evidence of mitral stenosis.   4. The aortic valve is normal in structure. Aortic valve regurgitation is  mild. Aortic valve sclerosis is present, with no evidence of aortic valve  stenosis.   5. The inferior vena cava is normal in size with greater than 50%  respiratory variability, suggesting right atrial pressure of 3 mmHg.   Patient Profile     83 y.o. male  with a hx of CAD with apical MI 12/06/2005 status post stenting x 3 to the LAD, TIA/CVA in April 2012, atrial tachycardia, bilateral carotid artery disease, dementia, diabetes type 2, hypertension, hyperlipidemia, prior tobacco use who is being seen 04/13/2022 for the evaluation of NSTEMI    Assessment & Plan    Non-STEMI CAD s/p remote stenting in 2007 - presented with sharp chest pain improved with ASA - HS trop up to 1,422 - IV heparin - echo showed LVEF 55-60%, mild LVH, G1DD, mild AI - continue PTA ASA '81mg'$  daily and Plavix '75mg'$  daily - conitnue Lipitor '80mg'$  dailym lisinopril '20mg'$  daily and Lopressor '25mg'$  BID - kidney function is stable - LHC showed 3V CAD with plan to discuss best treatment options. After discussion, plan for repeat cath with intervention today. Risks and benefits of cardiac catheterization have been discussed with the patient.  These include bleeding, infection, kidney damage, stroke, heart attack, death.  The patient understands these risks and is willing to proceed.   H/o COVID-19 - per IM   Carotid artery disease - Carotid US in 2020 showed 1-39% disease - continue Apsirin and statin therapy   HTN - BP severely elevated on admission - PTA amlodipine '5mg'$  daily, lisinopril '20mg'$  daily, Lopressor '25mg'$  BID. Amlodipine held on admission - BP still elevated   Atrial tachycardia - continue with telemetry    HLD - TG 257, HDL 40, LDL 114 - PTA simvastatin>changed to Lipitor '80mg'$  daily  For questions or updates, please contact [place] Please consult [email or web address] for contact info under        Signed, Skylarr Liz Ninfa Meeker, PA-C  04/14/2022, 8:04 AM

## 2022-04-15 ENCOUNTER — Encounter: Payer: Self-pay | Admitting: Internal Medicine

## 2022-04-15 DIAGNOSIS — I214 Non-ST elevation (NSTEMI) myocardial infarction: Secondary | ICD-10-CM | POA: Diagnosis not present

## 2022-04-15 DIAGNOSIS — I10 Essential (primary) hypertension: Secondary | ICD-10-CM | POA: Diagnosis not present

## 2022-04-15 LAB — BASIC METABOLIC PANEL
Anion gap: 9 (ref 5–15)
BUN: 19 mg/dL (ref 8–23)
CO2: 20 mmol/L — ABNORMAL LOW (ref 22–32)
Calcium: 8.8 mg/dL — ABNORMAL LOW (ref 8.9–10.3)
Chloride: 106 mmol/L (ref 98–111)
Creatinine, Ser: 1.08 mg/dL (ref 0.61–1.24)
GFR, Estimated: >60 mL/min (ref >60)
Glucose, Bld: 197 mg/dL — ABNORMAL HIGH (ref 70–99)
Potassium: 4.2 mmol/L (ref 3.5–5.1)
Sodium: 135 mmol/L (ref 135–145)

## 2022-04-15 LAB — GLUCOSE, CAPILLARY
Glucose-Capillary: 190 mg/dL — ABNORMAL HIGH (ref 70–99)
Glucose-Capillary: 166 mg/dL — ABNORMAL HIGH (ref 70–99)

## 2022-04-15 MED ORDER — PANTOPRAZOLE SODIUM 20 MG PO TBEC: 20.0000 mg | DELAYED_RELEASE_TABLET | Freq: Every day | ORAL | 2 refills | Status: DC

## 2022-04-15 MED ORDER — ATORVASTATIN CALCIUM 80 MG PO TABS: 80.0000 mg | ORAL_TABLET | Freq: Every day | ORAL | 2 refills | Status: DC

## 2022-04-15 NOTE — Discharge Summary (Addendum)
Physician Discharge Summary   Patient: Raymond Sampson MRN: [id number] DOB: 08/23/1940  Admit date:     04/12/2022  Discharge date: 04/15/22  Discharge Physician: Ezekiel Slocumb   PCP: [name], MD   Recommendations at discharge:    Follow up with Cardiology Follow up with Primary Care Outpatient PT and/or Cardiac Rehab recommended Repeat CBC,BMP,Mg in 1-2 weeks  Discharge Diagnoses: Principal Problem:   NSTEMI (non-ST elevated myocardial infarction) ([place]) Active Problems:   Essential hypertension   History of COVID-19   Lung nodule   PAD (peripheral artery disease) ([facility])   Mild dementia ([place])  Resolved Problems:   Chest pain  Hospital Course: Raymond Sampson is a 82 y.o. male with history of CAD with MI 12/06/2005 status post stenting x 3 to the LAD, TIA/CVA in April 2012, atrial tachycardia, bilateral carotid artery disease, dementia, diabetes type 2, hypertension, hyperlipidemia, prior tobacco use who presented to the ED on 04/12/2022 for evaluation of chest pain.   Pain was notably worse with exertion, non-radiatin, substernal and mild to moderate in intensity.  ED evaluation was concerning for Non-STEMI with hs-troponin elevation with trend upwards (686 >> 765 >> 1111 >> 1422).  He was admitted to hospital, started on IV heparin and Cardiology consulted.   1/11 - cardiology plans for left heart cath this afternoon.  Pt denies active chest pain overnight or this AM.  1/12 - return to cath lab, intervention to RCA, LAD and at prior mid-LAD stent.  Remains chest pain free  1/13 - cardiology has cleared for discharge as requested by patient and family.  Plan for outpatient follow up to discussed staged PCI to Lcx.    Assessment and Plan: * NSTEMI (non-ST elevated myocardial infarction) ([place]) Mgmt per Cardiology. Initial Cath 1/11 showed triple vessel disease. Cath 1/12 with intervention to RCA, LAD and mid-LAD prior stent Staged PCI planned for Lcx as  outpatient Continue metoprolol, statin, lisinopril, ASA  Mild dementia ([place]) Delirium precautions  Lung nodule Repeat CT scan in 3 months per radiology recommendation. Finds possibly COVID-19 sequelae. PCP to follow up.  History of COVID-19 Recent COVID-19 infection, diagnosed 03/31/2022. CT findings are likely sequelae. No hypoxia or respiratory complaints.   Essential hypertension BP's elevated on admission, mildly so but fluctuating. Resumed lisinopril, metoprolol.  Chest pain-resolved as of 04/15/2022 POA, resolved. Mgmt at outlined and per cardiology. See NSTEMI.         Consultants: Cardiology Procedures performed: left heart cath x 2, PCI  Disposition: Home Diet recommendation:  Discharge Diet Orders (From admission, onward)     Start     Ordered   04/15/22 0000  Diet - low sodium heart healthy        04/15/22 1317           Cardiac diet DISCHARGE MEDICATION: Allergies as of 04/15/2022   No Known Allergies      Medication List     STOP taking these medications    amLODipine 5 MG tablet Commonly known as: NORVASC   simvastatin 20 MG tablet Commonly known as: ZOCOR       TAKE these medications    aspirin 81 MG tablet Take 81 mg by mouth daily.   atorvastatin 80 MG tablet Commonly known as: LIPITOR Take 1 tablet (80 mg total) by mouth daily. Start taking on: April 16, 2022   clopidogrel 75 MG tablet Commonly known as: PLAVIX TAKE 1 TABLET BY MOUTH DAILY WITH BREAKFAST.   cyanocobalamin 1000 MCG  tablet Take 1,000 mcg by mouth daily.   lisinopril 20 MG tablet Commonly known as: ZESTRIL TAKE 1 TABLET BY MOUTH EVERY DAY   metFORMIN 1000 MG tablet Commonly known as: GLUCOPHAGE Take 1,000 mg by mouth 2 (two) times daily with a meal.   metoprolol tartrate 25 MG tablet Commonly known as: LOPRESSOR TAKE 1 TABLET BY MOUTH TWICE A DAY   pantoprazole 20 MG tablet Commonly known as: PROTONIX Take 1 tablet (20 mg total) by mouth  daily. Start taking on: April 16, 2022   sertraline 25 MG tablet Commonly known as: ZOLOFT Take 1 tablet (25 mg total) by mouth daily.   sitaGLIPtin 25 MG tablet Commonly known as: JANUVIA Take 1 tablet by mouth daily.        Discharge Exam: Filed Weights   04/12/22 1727  Weight: 74.8 kg   General exam: awake, alert, no acute distress HEENT: atraumatic, clear conjunctiva, anicteric sclera, moist mucus membranes, hearing grossly normal  Respiratory system: CTAB, no wheezes, rales or rhonchi, normal respiratory effort. Cardiovascular system: normal S1/S2, RRR, no JVD, murmurs, rubs, gallops, no pedal edema.   Gastrointestinal system: soft, NT, ND, no HSM felt, +bowel sounds. Central nervous system: A&O xself and place. no gross focal neurologic deficits, normal speech Extremities: moves all , no edema, normal tone Skin: dry, intact, normal temperature, normal color, o rashes, lesions or ulcers Psychiatry: normal mood, congruent affect   Condition at discharge: stable  The results of significant diagnostics from this hospitalization (including imaging, microbiology, ancillary and laboratory) are listed below for reference.   Imaging Studies: CARDIAC CATHETERIZATION  Result Date: 04/14/2022 Conclusions: Severe three-vessel coronary artery disease, as detailed in Dr. Allison Quarry diagnostic catheterization report from yesterday. Successful PCI to proximal RCA using Onyx Frontier 2.5 x 18 mm drug-eluting stent with 0% residual stenosis and TIMI-3 flow. Successful PCI to proximal LAD stenosis using Onyx Frontier 3.5 x 15 mm drug-eluting stent with 0% residual stenosis and TIMI-3 flow. Successful angioplasty and drug-eluting stent placement to in-stent restenosis involving long segment of mid/distal LAD stenting.  The entire stent was angioplastied and an Onyx Frontier 2.0 x 18 mm drug-eluting stent placed at the distal edge, leading to reduction of stenosis from 70% to 20-30% in the  proximal and mid portions and 0% residual stenosis at the distal edge with TIMI-3 flow throughout the vessel. Recommendations: Continue indefinite dual antiplatelet therapy with aspirin and clopidogrel. Aggressive secondary prevention of coronary artery disease. Staged PCI to LCx disease.  Timing will depend on patient's symptoms.  If he is angina free and otherwise stable for discharge over the weekend, staged PCI could be arranged to be done in the next 1 to 2 weeks as an outpatient.  If he has recurrent angina or is otherwise still in the hospital, PCI to the LCx could be performed early next week. Nelva Bush, MD [facility]  CARDIAC CATHETERIZATION  Result Date: 04/13/2022   --------Severe 3 Vessel CAD--------------   Small caliber-codominant RCA: Prox RCA-1 lesion is 70% stenosis tapers to Prox RCA-2 lesion is 99% stenosed. (Likely Culprit Lesion #1)   Codominant circumflex:  Prox Cx lesion is 55% stenosed.  1st Mrg lesion is 70% stenosed. Just past 1st Mrf, Mid Cx to Dist Cx lesion is 80% stenosed with 70% stenosed side branch in 2nd Mrg.   Prox LAD to Mid LAD lesion is 80% stenosed - focal/concentric.  Mid LAD to Dist LAD long stented segment is 70% stenosed. In-stent restenosis. Dist LAD lesion is 95%  stenosed -> involving the distal stent edge into native vessel.   -------------------------------------------   LV end diastolic pressure is moderately elevated.   There is no aortic valve stenosis. FOR FUTURE CARDIAC CATHETERIZATION, WOULD NOT RECOMMEND USING RIGHT RADIAL ACCESS DUE TO THE EXTREME TORTUOSITY OF THE SUBCLAVIAN-INNOMINATE ARTERY AND AORTIC ARCH. PLAN OF CARE:  Continue with plan to admit overnight.  Will restart IV heparin 2 hours after sheath removal. \  Will need to meet with patient and family as well as have a heart team discussion with interventional cardiology and CVTS to determine best treatment options.  If he were to need PCI, may be best for him to be transferred to [facility] as the procedure would like to be prolonged and difficult.Potential needs to be noted to the patient's mental status, and baseline functional status. Glenetta Hew, MD  ECHOCARDIOGRAM COMPLETE  Result Date: 04/13/2022    ECHOCARDIOGRAM REPORT   Patient Name:   Raymond Sampson Date of Exam: 04/13/2022 Medical Rec #:  [id number]        Height:       67.0 in Accession #:    [id number]       Weight:       165.0 lb Date of Birth:  11-16-1940        BSA:          1.863 m Patient Age:    65 years         BP:           148/84 mmHg Patient Gender: M                HR:           70 bpm. Exam Location:  [facility] Procedure: 2D Echo, Cardiac Doppler and Color Doppler Indications:     NSTEMI I21.4  History:         Patient has no prior history of Echocardiogram examinations.                  CAD; Risk Factors:Hypertension.  Sonographer:     Sherrie Sport Referring Phys:  [id number] [facility] GOEL Diagnosing Phys: Ida Rogue MD  Sonographer Comments: Image quality was fair. IMPRESSIONS  1. Left ventricular ejection fraction, by estimation, is 55 to 60%. The left ventricle has normal function. The left ventricle demonstrates regional wall motion abnormalities (hypokinesis of teh basal septal region). There is mild left ventricular hypertrophy. Left ventricular diastolic parameters are consistent with Grade I diastolic dysfunction (impaired relaxation).  2. Right ventricular systolic function is normal. The right ventricular size is normal. There is normal pulmonary artery systolic pressure. The estimated right ventricular systolic pressure is 69.6 mmHg.  3. The mitral valve is normal in structure. No evidence of mitral valve regurgitation. No evidence of mitral stenosis.  4. The aortic valve is normal in structure. Aortic valve regurgitation is mild. Aortic valve sclerosis is present, with no evidence of aortic valve stenosis.  5. The inferior vena cava is normal in size with greater than 50% respiratory variability, suggesting right  atrial pressure of 3 mmHg. FINDINGS  Left Ventricle: Left ventricular ejection fraction, by estimation, is 55 to 60%. The left ventricle has normal function. The left ventricle demonstrates regional wall motion abnormalities. The left ventricular internal cavity size was normal in size. There is mild left ventricular hypertrophy. Left ventricular diastolic parameters are consistent with Grade I diastolic dysfunction (impaired relaxation). Right Ventricle: The right ventricular size is normal. No increase in right  ventricular wall thickness. Right ventricular systolic function is normal. There is normal pulmonary artery systolic pressure. The tricuspid regurgitant velocity is 1.89 m/s, and  with an assumed right atrial pressure of 5 mmHg, the estimated right ventricular systolic pressure is 74.1 mmHg. Left Atrium: Left atrial size was normal in size. Right Atrium: Right atrial size was normal in size. Pericardium: There is no evidence of pericardial effusion. Mitral Valve: The mitral valve is normal in structure. No evidence of mitral valve regurgitation. No evidence of mitral valve stenosis. Tricuspid Valve: The tricuspid valve is normal in structure. Tricuspid valve regurgitation is mild . No evidence of tricuspid stenosis. Aortic Valve: The aortic valve is normal in structure. Aortic valve regurgitation is mild. Aortic valve sclerosis is present, with no evidence of aortic valve stenosis. Aortic valve mean gradient measures 4.0 mmHg. Aortic valve peak gradient measures 6.5  mmHg. Aortic valve area, by VTI measures 2.89 cm. Pulmonic Valve: The pulmonic valve was normal in structure. Pulmonic valve regurgitation is not visualized. No evidence of pulmonic stenosis. Aorta: The aortic root is normal in size and structure. Venous: The inferior vena cava is normal in size with greater than 50% respiratory variability, suggesting right atrial pressure of 3 mmHg. IAS/Shunts: No atrial level shunt detected by color flow  Doppler.  LEFT VENTRICLE PLAX 2D LVIDd:         3.60 cm   Diastology LVIDs:         2.60 cm   LV e' medial:    5.22 cm/s LV PW:         1.30 cm   LV E/e' medial:  12.5 LV IVS:        0.80 cm   LV e' lateral:   5.33 cm/s LVOT diam:     2.10 cm   LV E/e' lateral: 12.2 LV SV:         71 LV SV Index:   38 LVOT Area:     3.46 cm  RIGHT VENTRICLE RV Basal diam:  3.70 cm RV Mid diam:    2.90 cm RV S prime:     17.10 cm/s LEFT ATRIUM           Index        RIGHT ATRIUM           Index LA diam:      3.50 cm 1.88 cm/m   RA Area:     13.10 cm LA Vol (A2C): 27.0 ml 14.49 ml/m  RA Volume:   29.50 ml  15.83 ml/m LA Vol (A4C): 17.7 ml 9.50 ml/m  AORTIC VALVE AV Area (Vmax):    2.78 cm AV Area (Vmean):   2.72 cm AV Area (VTI):     2.89 cm AV Vmax:           127.00 cm/s AV Vmean:          88.100 cm/s AV VTI:            0.246 m AV Peak Grad:      6.5 mmHg AV Mean Grad:      4.0 mmHg LVOT Vmax:         102.00 cm/s LVOT Vmean:        69.300 cm/s LVOT VTI:          0.205 m LVOT/AV VTI ratio: 0.83  AORTA Ao Root diam: 3.60 cm MITRAL VALVE                TRICUSPID VALVE MV  Area (PHT): 3.46 cm     TR Peak grad:   14.3 mmHg MV Decel Time: 219 msec     TR Vmax:        189.00 cm/s MV E velocity: 65.00 cm/s MV A velocity: 108.00 cm/s  SHUNTS MV E/A ratio:  0.60         Systemic VTI:  0.20 m                             Systemic Diam: 2.10 cm Ida Rogue MD Electronically signed by Ida Rogue MD Signature Date/Time: 04/13/2022/11:37:30 AM    Final    CT Angio Chest PE W and/or Wo Contrast  Result Date: 04/12/2022 CLINICAL DATA:  High probability for pulmonary embolism. Elevated blood pressure. EXAM: CT ANGIOGRAPHY CHEST WITH CONTRAST TECHNIQUE: Multidetector CT imaging of the chest was performed using the standard protocol during bolus administration of intravenous contrast. Multiplanar CT image reconstructions and MIPs were obtained to evaluate the vascular anatomy. RADIATION DOSE REDUCTION: This exam was performed according  to the departmental dose-optimization program which includes automated exposure control, adjustment of the mA and/or kV according to patient size and/or use of iterative reconstruction technique. CONTRAST:  56m OMNIPAQUE IOHEXOL 350 MG/ML SOLN COMPARISON:  None Available. FINDINGS: Cardiovascular: Satisfactory opacification of the pulmonary arteries to the segmental level. No evidence of pulmonary embolism. Normal heart size. No pericardial effusion. There are atherosclerotic calcifications of the aorta and coronary arteries. Mediastinum/Nodes: There are mildly enlarged right hilar lymph nodes measuring up to 1 cm. There are nonenlarged mediastinal lymph nodes diffusely. There also nonenlarged left hilar lymph nodes. The esophagus and thyroid gland are within normal limits. Lungs/Pleura: Mild emphysematous changes are present. There are scattered patchy ground-glass opacities throughout both lungs. There is a focal slightly nodular density in the left lung base measuring 2.5 by 1.1 cm. There is no pleural effusion or pneumothorax. Upper Abdomen: There is a low-density right adrenal nodule measuring 16 mm compatible with benign adenoma. Musculoskeletal: No acute fractures are seen. Cervical spinal fusion plate is partially visualized. Review of the MIP images confirms the above findings. IMPRESSION: 1. No evidence for pulmonary embolism. 2. Patchy ground-glass opacities throughout both lungs may represent infection/inflammation or mild edema. 3. Focal nodular density in the left lung base measuring 2.5 x 1.1 cm. Per Fleischner Society Guidelines, consider a non-contrast Chest CT at 3 months, a PET/CT, or tissue sampling. These guidelines do not apply to immunocompromised patients and patients with cancer. Follow up in patients with significant comorbidities as clinically warranted. For lung cancer screening, adhere to Lung-RADS guidelines. Reference: Radiology. 2017; 284(1):228-43. Findings may represent focal  atelectasis, infection, or neoplasm. 4. Mild right hilar lymphadenopathy. 5. Right adrenal adenoma. Aortic Atherosclerosis (ICD10-I70.0). Electronically Signed   By: ARonney AstersM.D.   On: 04/12/2022 20:58   DG Chest 2 View  Result Date: 04/12/2022 CLINICAL DATA:  Chest pain EXAM: CHEST - 2 VIEW COMPARISON:  Chest radiograph 06/05/2017 FINDINGS: The cardiomediastinal silhouette is normal There is no focal consolidation or pulmonary edema. There is no pleural effusion or pneumothorax There is no acute osseous abnormality. IMPRESSION: No radiographic evidence of acute cardiopulmonary process. Electronically Signed   By: PValetta MoleM.D.   On: 04/12/2022 18:11    Microbiology: No results found for this or any previous visit.  Labs: CBC: Recent Labs  Lab 04/12/22 1730 04/13/22 0430 04/13/22 2023  WBC 9.0 11.7* 9.8  HGB 13.4  13.3 11.9*  HCT 41.7 39.8 36.7*  MCV 93.1 89.8 90.0  PLT 293 291 944   Basic Metabolic Panel: Recent Labs  Lab 04/12/22 1730 04/13/22 0430 04/14/22 0609 04/15/22 0841  NA 136 137 136 135  K 4.7 3.7 3.6 4.2  CL 102 101 104 106  CO2 25 23 20* 20*  GLUCOSE 135* 156* 235* 197*  BUN '16 15 17 19  '$ CREATININE 1.05 0.98 0.95 1.08  CALCIUM 9.2 8.9 9.0 8.8*  MG  --   --  1.9  --    Liver Function Tests: Recent Labs  Lab 04/12/22 1730  AST 24  ALT 17  ALKPHOS 72  BILITOT 0.4  PROT 7.6  ALBUMIN 4.0   CBG: Recent Labs  Lab 04/14/22 0805 04/14/22 1309 04/14/22 2032 04/15/22 1003 04/15/22 1140  GLUCAP 220* 185* 170* 190* 166*    Discharge time spent: less than 30 minutes.  Signed: Ezekiel Slocumb, DO [facility] 04/15/2022

## 2022-04-15 NOTE — TOC Transition Note (Signed)
Transition of Care [facility]) - CM/SW Discharge Note   Patient Details  Name: Raymond Sampson MRN: [id number] Date of Birth: Apr 28, 1940  [facility] of [facility]) CM/SW Contact:  Harriet Masson, RN Phone Number:[phone] 04/15/2022, 1:52 PM   Clinical Narrative:    Spoke with spouse [name]) concerning the recommendations for [place] chosen for therapy. Information signed and faxed directly to the clinic for the recommended therapy. Wife reports pt has a RW to use as needed based upon the recommendations. Pt lives with his spouse and has a good support system and able to afford his medications.  Final next level of care: OP Rehab Barriers to Discharge: Barriers Resolved   Patient Goals and CMS Choice   Choice offered to / list presented to : Patient  Discharge Placement                    Name of family member notified: [name] (spouse) Patient and family notified of of transfer: 04/15/22  Discharge Plan and Services Additional resources added to the After Visit Summary for                  DME Arranged: Walker rolling                    Social Determinants of Health (SDOH) Interventions SDOH Screenings   Food Insecurity: No Food Insecurity (04/13/2022)  Housing: Low Risk  (04/13/2022)  Transportation Needs: No Transportation Needs (04/13/2022)  Utilities: Not At Risk (04/13/2022)  Tobacco Use: Medium Risk (04/14/2022)     Readmission Risk Interventions     No data to display

## 2022-04-15 NOTE — Plan of Care (Signed)
Problem: Education: Goal: Ability to describe self-care measures that may prevent or decrease complications (Diabetes Survival Skills Education) will improve 04/15/2022 1423 by Shauna Hugh, RN Outcome: Adequate for Discharge 04/15/2022 0820 by Shauna Hugh, RN Outcome: Progressing Goal: Individualized Educational Video(s) 04/15/2022 1423 by Shauna Hugh, RN Outcome: Adequate for Discharge 04/15/2022 0820 by Shauna Hugh, RN Outcome: Progressing   Problem: Coping: Goal: Ability to adjust to condition or change in health will improve 04/15/2022 1423 by Shauna Hugh, RN Outcome: Adequate for Discharge 04/15/2022 0820 by Shauna Hugh, RN Outcome: Progressing   Problem: Fluid Volume: Goal: Ability to maintain a balanced intake and output will improve 04/15/2022 1423 by Shauna Hugh, RN Outcome: Adequate for Discharge 04/15/2022 0820 by Shauna Hugh, RN Outcome: Progressing   Problem: Health Behavior/Discharge Planning: Goal: Ability to identify and utilize available resources and services will improve 04/15/2022 1423 by Shauna Hugh, RN Outcome: Adequate for Discharge 04/15/2022 0820 by Shauna Hugh, RN Outcome: Progressing Goal: Ability to manage health-related needs will improve 04/15/2022 1423 by Shauna Hugh, RN Outcome: Adequate for Discharge 04/15/2022 0820 by Shauna Hugh, RN Outcome: Progressing   Problem: Metabolic: Goal: Ability to maintain appropriate glucose levels will improve 04/15/2022 1423 by Shauna Hugh, RN Outcome: Adequate for Discharge 04/15/2022 0820 by Shauna Hugh, RN Outcome: Progressing   Problem: Nutritional: Goal: Maintenance of adequate nutrition will improve 04/15/2022 1423 by Shauna Hugh, RN Outcome: Adequate for Discharge 04/15/2022 0820 by Shauna Hugh, RN Outcome: Progressing Goal: Progress toward achieving an optimal weight will improve 04/15/2022 1423 by Shauna Hugh, RN Outcome: Adequate for Discharge 04/15/2022 0820 by Shauna Hugh, RN Outcome: Progressing   Problem: Skin Integrity: Goal: Risk for impaired skin integrity will decrease 04/15/2022 1423 by Shauna Hugh, RN Outcome: Adequate for Discharge 04/15/2022 0820 by Shauna Hugh, RN Outcome: Progressing   Problem: Tissue Perfusion: Goal: Adequacy of tissue perfusion will improve 04/15/2022 1423 by Shauna Hugh, RN Outcome: Adequate for Discharge 04/15/2022 0820 by Shauna Hugh, RN Outcome: Progressing   Problem: Education: Goal: Understanding of cardiac disease, CV risk reduction, and recovery process will improve 04/15/2022 1423 by Shauna Hugh, RN Outcome: Adequate for Discharge 04/15/2022 0820 by Shauna Hugh, RN Outcome: Progressing Goal: Individualized Educational Video(s) 04/15/2022 1423 by Shauna Hugh, RN Outcome: Adequate for Discharge 04/15/2022 0820 by Shauna Hugh, RN Outcome: Progressing   Problem: Activity: Goal: Ability to tolerate increased activity will improve 04/15/2022 1423 by Shauna Hugh, RN Outcome: Adequate for Discharge 04/15/2022 0820 by Shauna Hugh, RN Outcome: Progressing   Problem: Cardiac: Goal: Ability to achieve and maintain adequate cardiovascular perfusion will improve 04/15/2022 1423 by Shauna Hugh, RN Outcome: Adequate for Discharge 04/15/2022 0820 by Shauna Hugh, RN Outcome: Progressing   Problem: Health Behavior/Discharge Planning: Goal: Ability to safely manage health-related needs after discharge will improve 04/15/2022 1423 by Shauna Hugh, RN Outcome: Adequate for Discharge 04/15/2022 0820 by Shauna Hugh, RN Outcome: Progressing   Problem: Education: Goal: Understanding of CV disease, CV risk reduction, and recovery process will improve 04/15/2022 1423 by Shauna Hugh, RN Outcome: Adequate for Discharge 04/15/2022 0820 by Shauna Hugh, RN Outcome: Progressing Goal: Individualized Educational Video(s) 04/15/2022 1423 by Shauna Hugh, RN Outcome: Adequate for Discharge 04/15/2022 0820  by Shauna Hugh, RN Outcome: Progressing   Problem: Activity: Goal: Ability to return to baseline activity level will improve 04/15/2022 1423 by Shauna Hugh, RN Outcome: Adequate for Discharge 04/15/2022 0820 by Shauna Hugh, RN Outcome: Progressing   Problem: Cardiovascular: Goal: Ability to achieve and maintain adequate cardiovascular perfusion will improve 04/15/2022  1423 by Shauna Hugh, RN Outcome: Adequate for Discharge 04/15/2022 0820 by Shauna Hugh, RN Outcome: Progressing Goal: Vascular access site(s) Level 0-1 will be maintained 04/15/2022 1423 by Shauna Hugh, RN Outcome: Adequate for Discharge 04/15/2022 0820 by Shauna Hugh, RN Outcome: Progressing   Problem: Health Behavior/Discharge Planning: Goal: Ability to safely manage health-related needs after discharge will improve 04/15/2022 1423 by Shauna Hugh, RN Outcome: Adequate for Discharge 04/15/2022 0820 by Shauna Hugh, RN Outcome: Progressing   Problem: Education: Goal: Knowledge of General Education information will improve Description: Including pain rating scale, medication(s)/side effects and non-pharmacologic comfort measures 04/15/2022 1423 by Shauna Hugh, RN Outcome: Adequate for Discharge 04/15/2022 0820 by Shauna Hugh, RN Outcome: Progressing   Problem: Health Behavior/Discharge Planning: Goal: Ability to manage health-related needs will improve 04/15/2022 1423 by Shauna Hugh, RN Outcome: Adequate for Discharge 04/15/2022 0820 by Shauna Hugh, RN Outcome: Progressing   Problem: Clinical Measurements: Goal: Ability to maintain clinical measurements within normal limits will improve 04/15/2022 1423 by Shauna Hugh, RN Outcome: Adequate for Discharge 04/15/2022 0820 by Shauna Hugh, RN Outcome: Progressing Goal: Will remain free from infection 04/15/2022 1423 by Shauna Hugh, RN Outcome: Adequate for Discharge 04/15/2022 0820 by Shauna Hugh, RN Outcome: Progressing Goal: Diagnostic  test results will improve 04/15/2022 1423 by Shauna Hugh, RN Outcome: Adequate for Discharge 04/15/2022 0820 by Shauna Hugh, RN Outcome: Progressing Goal: Respiratory complications will improve 04/15/2022 1423 by Shauna Hugh, RN Outcome: Adequate for Discharge 04/15/2022 0820 by Shauna Hugh, RN Outcome: Progressing Goal: Cardiovascular complication will be avoided 04/15/2022 1423 by Shauna Hugh, RN Outcome: Adequate for Discharge 04/15/2022 0820 by Shauna Hugh, RN Outcome: Progressing   Problem: Activity: Goal: Risk for activity intolerance will decrease 04/15/2022 1423 by Shauna Hugh, RN Outcome: Adequate for Discharge 04/15/2022 0820 by Shauna Hugh, RN Outcome: Progressing   Problem: Nutrition: Goal: Adequate nutrition will be maintained 04/15/2022 1423 by Shauna Hugh, RN Outcome: Adequate for Discharge 04/15/2022 0820 by Shauna Hugh, RN Outcome: Progressing   Problem: Coping: Goal: Level of anxiety will decrease 04/15/2022 1423 by Shauna Hugh, RN Outcome: Adequate for Discharge 04/15/2022 0820 by Shauna Hugh, RN Outcome: Progressing   Problem: Elimination: Goal: Will not experience complications related to bowel motility 04/15/2022 1423 by Shauna Hugh, RN Outcome: Adequate for Discharge 04/15/2022 0820 by Shauna Hugh, RN Outcome: Progressing Goal: Will not experience complications related to urinary retention 04/15/2022 1423 by Shauna Hugh, RN Outcome: Adequate for Discharge 04/15/2022 0820 by Shauna Hugh, RN Outcome: Progressing   Problem: Pain Managment: Goal: General experience of comfort will improve 04/15/2022 1423 by Shauna Hugh, RN Outcome: Adequate for Discharge 04/15/2022 0820 by Shauna Hugh, RN Outcome: Progressing   Problem: Safety: Goal: Ability to remain free from injury will improve 04/15/2022 1423 by Shauna Hugh, RN Outcome: Adequate for Discharge 04/15/2022 0820 by Shauna Hugh, RN Outcome: Progressing   Problem:  Skin Integrity: Goal: Risk for impaired skin integrity will decrease 04/15/2022 1423 by Shauna Hugh, RN Outcome: Adequate for Discharge 04/15/2022 0820 by Shauna Hugh, RN Outcome: Progressing

## 2022-04-15 NOTE — Plan of Care (Signed)
  Problem: Education: Goal: Ability to describe self-care measures that may prevent or decrease complications (Diabetes Survival Skills Education) will improve Outcome: Progressing Goal: Individualized Educational Video(s) Outcome: Progressing   Problem: Coping: Goal: Ability to adjust to condition or change in health will improve Outcome: Progressing   Problem: Fluid Volume: Goal: Ability to maintain a balanced intake and output will improve Outcome: Progressing   Problem: Health Behavior/Discharge Planning: Goal: Ability to identify and utilize available resources and services will improve Outcome: Progressing Goal: Ability to manage health-related needs will improve Outcome: Progressing   Problem: Metabolic: Goal: Ability to maintain appropriate glucose levels will improve Outcome: Progressing   Problem: Nutritional: Goal: Maintenance of adequate nutrition will improve Outcome: Progressing Goal: Progress toward achieving an optimal weight will improve Outcome: Progressing   Problem: Skin Integrity: Goal: Risk for impaired skin integrity will decrease Outcome: Progressing   Problem: Tissue Perfusion: Goal: Adequacy of tissue perfusion will improve Outcome: Progressing   Problem: Education: Goal: Understanding of cardiac disease, CV risk reduction, and recovery process will improve Outcome: Progressing Goal: Individualized Educational Video(s) Outcome: Progressing   Problem: Activity: Goal: Ability to tolerate increased activity will improve Outcome: Progressing   Problem: Cardiac: Goal: Ability to achieve and maintain adequate cardiovascular perfusion will improve Outcome: Progressing   Problem: Health Behavior/Discharge Planning: Goal: Ability to safely manage health-related needs after discharge will improve Outcome: Progressing   Problem: Education: Goal: Understanding of CV disease, CV risk reduction, and recovery process will improve Outcome:  Progressing Goal: Individualized Educational Video(s) Outcome: Progressing   Problem: Activity: Goal: Ability to return to baseline activity level will improve Outcome: Progressing   Problem: Cardiovascular: Goal: Ability to achieve and maintain adequate cardiovascular perfusion will improve Outcome: Progressing Goal: Vascular access site(s) Level 0-1 will be maintained Outcome: Progressing   Problem: Health Behavior/Discharge Planning: Goal: Ability to safely manage health-related needs after discharge will improve Outcome: Progressing   Problem: Education: Goal: Knowledge of General Education information will improve Description: Including pain rating scale, medication(s)/side effects and non-pharmacologic comfort measures Outcome: Progressing   Problem: Health Behavior/Discharge Planning: Goal: Ability to manage health-related needs will improve Outcome: Progressing   Problem: Clinical Measurements: Goal: Ability to maintain clinical measurements within normal limits will improve Outcome: Progressing Goal: Will remain free from infection Outcome: Progressing Goal: Diagnostic test results will improve Outcome: Progressing Goal: Respiratory complications will improve Outcome: Progressing Goal: Cardiovascular complication will be avoided Outcome: Progressing   Problem: Activity: Goal: Risk for activity intolerance will decrease Outcome: Progressing   Problem: Nutrition: Goal: Adequate nutrition will be maintained Outcome: Progressing   Problem: Coping: Goal: Level of anxiety will decrease Outcome: Progressing   Problem: Elimination: Goal: Will not experience complications related to bowel motility Outcome: Progressing Goal: Will not experience complications related to urinary retention Outcome: Progressing   Problem: Pain Managment: Goal: General experience of comfort will improve Outcome: Progressing   Problem: Safety: Goal: Ability to remain free from  injury will improve Outcome: Progressing   Problem: Skin Integrity: Goal: Risk for impaired skin integrity will decrease Outcome: Progressing

## 2022-04-15 NOTE — Progress Notes (Signed)
Cardiology Progress Note  Patient ID: Raymond Sampson MRN: [id number] DOB: November 19, 1940 Date of Encounter: 04/15/2022  Primary Cardiologist: Ida Rogue, MD  Subjective   Chief Complaint: None.   HPI: Wife at the bedside.  Denies chest pain or trouble breathing.  Telemetry was sinus rhythm in the 70s.  Right femoral cath site with 2+ pulse.  No evidence of hematoma.  ROS:  All other ROS reviewed and negative. Pertinent positives noted in the HPI.     Inpatient Medications  Scheduled Meds:  aspirin EC  81 mg Oral Daily   atorvastatin  80 mg Oral Daily   clopidogrel  75 mg Oral Q breakfast   enoxaparin (LOVENOX) injection  40 mg Subcutaneous Q24H   insulin aspart  0-15 Units Subcutaneous TID WC   insulin aspart  0-5 Units Subcutaneous QHS   lisinopril  20 mg Oral Daily   metoprolol tartrate  25 mg Oral BID   pantoprazole  20 mg Oral Daily   sertraline  25 mg Oral Daily   sodium chloride flush  3 mL Intravenous Q12H   sodium chloride flush  3 mL Intravenous Q12H   sodium chloride flush  3 mL Intravenous Q12H   Continuous Infusions:  sodium chloride     sodium chloride     PRN Meds: sodium chloride, sodium chloride, acetaminophen, acetaminophen, nitroGLYCERIN, ondansetron (ZOFRAN) IV, sodium chloride flush, sodium chloride flush   Vital Signs   Vitals:   04/14/22 2253 04/15/22 0458 04/15/22 0740 04/15/22 1139  BP: (!) 140/78 (!) 160/65 (!) 148/65 (!) 140/62  Pulse: 93 73 73 73  Resp: '20 18 19 18  '$ Temp: 98.4 F (36.9 C) 97.8 F (36.6 C)    TempSrc: Oral Oral    SpO2: 94% 96% 93% 96%  Weight:      Height:        Intake/Output Summary (Last 24 hours) at 04/15/2022 1306 Last data filed at 04/15/2022 0800 Gross per 24 hour  Intake 161.27 ml  Output 400 ml  Net -238.73 ml      04/12/2022    5:27 PM 05/31/2021   11:54 AM 08/19/2020    7:54 AM  Last 3 Weights  Weight (lbs) 165 lb 159 lb 2 oz 160 lb  Weight (kg) 74.844 kg 72.179 kg 72.576 kg      Telemetry   Overnight telemetry shows sinus rhythm in the 70s, which I personally reviewed.   ECG  The most recent ECG shows sinus rhythm heart rate 75, diffuse ST depression, which I personally reviewed.   Physical Exam   Vitals:   04/14/22 2253 04/15/22 0458 04/15/22 0740 04/15/22 1139  BP: (!) 140/78 (!) 160/65 (!) 148/65 (!) 140/62  Pulse: 93 73 73 73  Resp: '20 18 19 18  '$ Temp: 98.4 F (36.9 C) 97.8 F (36.6 C)    TempSrc: Oral Oral    SpO2: 94% 96% 93% 96%  Weight:      Height:        Intake/Output Summary (Last 24 hours) at 04/15/2022 1306 Last data filed at 04/15/2022 0800 Gross per 24 hour  Intake 161.27 ml  Output 400 ml  Net -238.73 ml       04/12/2022    5:27 PM 05/31/2021   11:54 AM 08/19/2020    7:54 AM  Last 3 Weights  Weight (lbs) 165 lb 159 lb 2 oz 160 lb  Weight (kg) 74.844 kg 72.179 kg 72.576 kg    Body mass index is 25.84 kg/m.  General: Well nourished, well developed, in no acute distress Head: Atraumatic, normal size  Eyes: PEERLA, EOMI  Neck: Supple, no JVD Endocrine: No thryomegaly Cardiac: Normal S1, S2; RRR; no murmurs, rubs, or gallops Lungs: Clear to auscultation bilaterally, no wheezing, rhonchi or rales  Abd: Soft, nontender, no hepatomegaly  Ext: No edema, pulses 2+, right radial pulse 2+, right femoral pulse 2+, no bleeding or bruising noted Musculoskeletal: No deformities, BUE and BLE strength normal and equal Skin: Warm and dry, no rashes   Neuro: Alert and oriented to person, place, time, and situation, CNII-XII grossly intact, no focal deficits  Psych: Normal mood and affect   Labs  High Sensitivity Troponin:   Recent Labs  Lab 04/12/22 1730 04/12/22 1918 04/13/22 0430 04/13/22 1014  TROPONINIHS 686* 765* 1,111* 1,422*     Cardiac EnzymesNo results for input(s): "TROPONINI" in the last 168 hours. No results for input(s): "TROPIPOC" in the last 168 hours.  Chemistry Recent Labs  Lab 04/12/22 1730 04/13/22 0430 04/14/22 0609  04/15/22 0841  NA 136 137 136 135  K 4.7 3.7 3.6 4.2  CL 102 101 104 106  CO2 25 23 20* 20*  GLUCOSE 135* 156* 235* 197*  BUN '16 15 17 19  '$ CREATININE 1.05 0.98 0.95 1.08  CALCIUM 9.2 8.9 9.0 8.8*  PROT 7.6  --   --   --   ALBUMIN 4.0  --   --   --   AST 24  --   --   --   ALT 17  --   --   --   ALKPHOS 72  --   --   --   BILITOT 0.4  --   --   --   GFRNONAA >60 >60 >60 >60  ANIONGAP '9 13 12 9    '$ Hematology Recent Labs  Lab 04/12/22 1730 04/13/22 0430 04/13/22 2023  WBC 9.0 11.7* 9.8  RBC 4.48 4.43 4.08*  HGB 13.4 13.3 11.9*  HCT 41.7 39.8 36.7*  MCV 93.1 89.8 90.0  MCH 29.9 30.0 29.2  MCHC 32.1 33.4 32.4  RDW 12.9 12.7 12.9  PLT 293 291 257   BNPNo results for input(s): "BNP", "PROBNP" in the last 168 hours.  DDimer No results for input(s): "DDIMER" in the last 168 hours.   Radiology  CARDIAC CATHETERIZATION  Result Date: 04/14/2022 Conclusions: Severe three-vessel coronary artery disease, as detailed in Dr. Allison Quarry diagnostic catheterization report from yesterday. Successful PCI to proximal RCA using Onyx Frontier 2.5 x 18 mm drug-eluting stent with 0% residual stenosis and TIMI-3 flow. Successful PCI to proximal LAD stenosis using Onyx Frontier 3.5 x 15 mm drug-eluting stent with 0% residual stenosis and TIMI-3 flow. Successful angioplasty and drug-eluting stent placement to in-stent restenosis involving long segment of mid/distal LAD stenting.  The entire stent was angioplastied and an Onyx Frontier 2.0 x 18 mm drug-eluting stent placed at the distal edge, leading to reduction of stenosis from 70% to 20-30% in the proximal and mid portions and 0% residual stenosis at the distal edge with TIMI-3 flow throughout the vessel. Recommendations: Continue indefinite dual antiplatelet therapy with aspirin and clopidogrel. Aggressive secondary prevention of coronary artery disease. Staged PCI to LCx disease.  Timing will depend on patient's symptoms.  If he is angina free and  otherwise stable for discharge over the weekend, staged PCI could be arranged to be done in the next 1 to 2 weeks as an outpatient.  If he has recurrent angina or is otherwise still  in the hospital, PCI to the LCx could be performed early next week. Nelva Bush, MD [facility]  CARDIAC CATHETERIZATION  Result Date: 04/13/2022   --------Severe 3 Vessel CAD--------------   Small caliber-codominant RCA: Prox RCA-1 lesion is 70% stenosis tapers to Prox RCA-2 lesion is 99% stenosed. (Likely Culprit Lesion #1)   Codominant circumflex:  Prox Cx lesion is 55% stenosed.  1st Mrg lesion is 70% stenosed. Just past 1st Mrf, Mid Cx to Dist Cx lesion is 80% stenosed with 70% stenosed side branch in 2nd Mrg.   Prox LAD to Mid LAD lesion is 80% stenosed - focal/concentric.  Mid LAD to Dist LAD long stented segment is 70% stenosed. In-stent restenosis. Dist LAD lesion is 95% stenosed -> involving the distal stent edge into native vessel.   -------------------------------------------   LV end diastolic pressure is moderately elevated.   There is no aortic valve stenosis. FOR FUTURE CARDIAC CATHETERIZATION, WOULD NOT RECOMMEND USING RIGHT RADIAL ACCESS DUE TO THE EXTREME TORTUOSITY OF THE SUBCLAVIAN-INNOMINATE ARTERY AND AORTIC ARCH. PLAN OF CARE:  Continue with plan to admit overnight.  Will restart IV heparin 2 hours after sheath removal. \  Will need to meet with patient and family as well as have a heart team discussion with interventional cardiology and CVTS to determine best treatment options.  If he were to need PCI, may be best for him to be transferred to [facility] as the procedure would like to be prolonged and difficult.Potential needs to be noted to the patient's mental status, and baseline functional status. Glenetta Hew, MD   Cardiac Studies  TTE 04/13/2022  1. Left ventricular ejection fraction, by estimation, is 55 to 60%. The  left ventricle has normal function. The left ventricle demonstrates   regional wall motion abnormalities (hypokinesis of teh basal septal  region). There is mild left ventricular  hypertrophy. Left ventricular diastolic parameters are consistent with  Grade I diastolic dysfunction (impaired relaxation).   2. Right ventricular systolic function is normal. The right ventricular  size is normal. There is normal pulmonary artery systolic pressure. The  estimated right ventricular systolic pressure is 30.1 mmHg.   3. The mitral valve is normal in structure. No evidence of mitral valve  regurgitation. No evidence of mitral stenosis.   4. The aortic valve is normal in structure. Aortic valve regurgitation is  mild. Aortic valve sclerosis is present, with no evidence of aortic valve  stenosis.   5. The inferior vena cava is normal in size with greater than 50%  respiratory variability, suggesting right atrial pressure of 3 mmHg.   LHC 04/14/2022 Conclusions: Severe three-vessel coronary artery disease, as detailed in Dr. Allison Quarry diagnostic catheterization report from yesterday. Successful PCI to proximal RCA using Onyx Frontier 2.5 x 18 mm drug-eluting stent with 0% residual stenosis and TIMI-3 flow. Successful PCI to proximal LAD stenosis using Onyx Frontier 3.5 x 15 mm drug-eluting stent with 0% residual stenosis and TIMI-3 flow. Successful angioplasty and drug-eluting stent placement to in-stent restenosis involving long segment of mid/distal LAD stenting.  The entire stent was angioplastied and an Onyx Frontier 2.0 x 18 mm drug-eluting stent placed at the distal edge, leading to reduction of stenosis from 70% to 20-30% in the proximal and mid portions and 0% residual stenosis at the distal edge with TIMI-3 flow throughout the vessel.   Recommendations: Continue indefinite dual antiplatelet therapy with aspirin and clopidogrel. Aggressive secondary prevention of coronary artery disease. Staged PCI to LCx disease.  Timing will depend  on patient's symptoms.  If he  is angina free and otherwise stable for discharge over the weekend, staged PCI could be arranged to be done in the next 1 to 2 weeks as an outpatient.  If he has recurrent angina or is otherwise still in the hospital, PCI to the LCx could be performed early next week.   Patient Profile  Raymond Sampson is a 82 y.o. male with CAD, dementia, TIA/CVA, diabetes, hypertension who was admitted on 04/13/2022 for non-STEMI.  Found to have multivessel CAD.  Status post PCI to the LAD and RCA.  Plan for staged intervention to the left circumflex.  Assessment & Plan   # Non-STEMI -Found to have three-vessel CAD.  Not a candidate for CABG given advanced age and dementia. -Had intervention to the LAD and RCA yesterday.  Doing well.  Right radial cath site and right femoral cath site clean and dry.  No evidence of bruising or bleeding. -Continue aspirin and Plavix. -Continue metoprolol tartrate 25 mg twice daily.  On lisinopril 20 mg daily. -LV function is normal. -On Lipitor 80 mg daily. -Discussed plan for staged intervention to the left circumflex.  Family request to go home.  Suspect he will need some rehab.  We can plan for outpatient follow-up to discuss staged intervention to the left circumflex.  He reports no chest pain or trouble breathing.  Telemetry is unremarkable.  I suspect he will have needs for possible acute rehab.  I will defer this to the hospital medicine team. -If the patient remains in house for a number of days more could always consider intervention while here.  I think it would be best to get this elderly gentleman in rehab and then come back.  His wife was in agreement. -We will tentatively plan for outpatient follow-up with stage intervention.  Please notify cardiology if the patient has any chest pain  [facility] will sign off.   Medication Recommendations: Medications as above Other recommendations (labs, testing, etc): None Follow up as an outpatient: We will arrange  outpatient follow-up in 1 to 2 weeks.  Plan for outpatient staged intervention to the left circumflex.  For questions or updates, please contact [place] Please consult [email or web address] for contact info under        Signed, [place] T. Audie Box, MD, [place]  04/15/2022 1:06 PM

## 2022-04-17 ENCOUNTER — Encounter: Payer: Self-pay | Admitting: Internal Medicine

## 2022-04-17 ENCOUNTER — Telehealth: Payer: Self-pay | Admitting: *Deleted

## 2022-04-17 NOTE — Telephone Encounter (Signed)
-----  Message from Yoe, PA-C sent at 04/17/2022  3:08 PM EST ----- Regarding: FW: Patient follow-up Please call and schedule  ----- Message ----- From: Geralynn Rile, MD Sent: 04/15/2022   1:14 PM EST To: Cadence Ninfa Meeker, PA-C Subject: Patient follow-up                              Admitted with non-STEMI.  Found to have three-vessel CAD.  Underwent PCI to the LAD and RCA.  Plan is for outpatient staged intervention to left circumflex.  Would have him seen in the office in 1 week and then set up for staged intervention.  [place] B[place] T. Audie Box, MD, [place]  [address] [place], [place] [address] [phone]  1:14 PM

## 2022-04-17 NOTE — Telephone Encounter (Signed)
No appointment scheduled at this time. To scheduling to arrange prior to nurse call.

## 2022-04-17 NOTE — Telephone Encounter (Signed)
2nd message received from PA.   Reviewed the patient's chart. He is scheduled for a hospital follow up on Wednesday 04/26/22 @ 10:30 am with Christell Faith, PA.

## 2022-04-17 NOTE — Telephone Encounter (Signed)
-----  Message from Mazon, PA-C sent at 04/15/2022 11:36 AM EST ----- Regarding: hosp follow-up Pt needs TOC visit in 1-2 weeks please

## 2022-04-19 NOTE — Telephone Encounter (Signed)
TOC call done by PCP office on 04/15/22. Patient is s/p day # 4 from discharge on 04/15/22.  He is due to follow up in our office on 04/26/22 with Christell Faith, PA.  Closing this encounter.

## 2022-04-24 NOTE — Progress Notes (Unsigned)
Cardiology Office Note    Date:  04/26/2022   ID:  Raymond Sampson, DOB [birth year], MRN [id number]  PCP:  Raymond Hire, MD  Cardiologist:  Raymond Rogue, MD  Electrophysiologist:  None   Chief Complaint: Hospital follow-up  History of Present Illness:   Raymond Sampson is a 82 y.o. male with history of CAD with apical MI in 2007 status post stenting x 3 to the LAD with NSTEMI in 04/2022 status post PCI and DES to the LAD and RCA as outlined below, TIA/CVA in 2012, atrial tachycardia, bilateral carotid artery disease, dementia, DM2, HTN, HLD, and prior tobacco use who presents for hospital follow-up as outlined below.  He was admitted  to the hospital in 08/2005 with an MI that was treated with PCI/DES x 3 to the LAD. Stress test in 2013 showed no significant ischemia, EF 68%, with severely elevated BP. Echo in 2014 showed an EF of 41-74%, diastolic dysfunction, normal RVSF and cavity size, normal RVSP. He was seen in early 2018 with palpitations and abnormal EKG leading to a Myoview in 04/2016 that was low risk. Outpatient cardiac monitoring in 05/2016 showed NSR with an average heart rate of 69 bpm (40-171 bpm), 7 atrial tachycardic runs occurred with the longest lasting 7 beats with an average rate of 105 bpm with no evidence of Afib.  Most recent carotid artery ultrasound from 12/2018 showed 1 to 39% bilateral ICA stenosis with bilateral vertebral arteries demonstrating antegrade flow and normal flow hemodynamics in the bilateral subclavian arteries.  He was admitted to [facility] from 1/10 through 04/15/2022 with an NSTEMI.  High-sensitivity troponin peaked at 1422.  CTA of the chest showed no evidence of PE with an incidental nodular density noted in the left lung base as well as mild right hilar lymphadenopathy, right adrenal adenoma, and aortic atherosclerosis.  Echo showed an EF of 55 to 60%, mild LVH, hypokinesis of the basal septal region, grade 1 diastolic dysfunction, normal RV systolic  function and ventricular cavity size, normal PASP, aortic valve sclerosis without evidence of stenosis, and normal estimated right atrial pressure.  LHC on 04/13/2022 showed severe three-vessel CAD as outlined below.  After further discussion, he was not felt to be a CABG candidate given advanced age and dementia with recommendation to proceed with complex PCI intervention.  He subsequently underwent repeat LHC on 04/14/2022 that again showed severe three-vessel CAD.  He underwent successful PCI/DES to the proximal LAD, PCI/DES to the proximal RCA, and PCI/DES to in-stent restenosis involving the long segment of mid/distal LAD stenting.  The entire stent was angioplastied and Onyx Frontier 2.0 x 18 mm drug-eluting stent was placed at the distal edge leading to reduction of stenosis from 70% to 20 to 30% in the proximal and mid portions with 0% residual stenosis at the distal edge and TIMI-3 flow throughout the vessel.  Following these interventions, he remained chest pain-free with family preferring the patient be discharged with planned staged PCI to the LCx be pursued in the outpatient setting.  He comes in accompanied by his wife today and is doing well from a cardiac perspective.  No angina or decompensation.  No dyspnea, palpitations, dizziness, presyncope, or syncope.  No falls, hematochezia, or melena.  His wife does report a day or 2 of dark urine following his hospital discharge that subsequently resolved without intervention and has not recurred since.  He has not missed any doses of DAPT since hospital discharge.  No cardiac cath site complications  along the right upper extremity or right groin.  He does also report some anterior bilateral thigh pain and weakness with prolonged ambulation without rest pain or nonhealing wounds.  His wife is also concerned about potential progression of prior mild carotid artery stenosis.  They remain interested in moving forward with planned staged PCI of the  LCx.   Labs independently reviewed: 04/2022 - potassium 4.2, BUN 19, serum creatinine 1.08, magnesium 1.9, Hgb 11.9, PLT 257, LP(a) 23, TC 205, TG 257, HDL 40, LDL 114, A1c 6.8, albumin 4.0, AST/ALT normal 05/2019 - TSH normal  Past Medical History:  Diagnosis Date   Arthritic-like pain    CAD (coronary artery disease)    a. apical MI 5/07 with LHC showing 50% pCFX, 30% pRCA, 99% pLAD, 99% mLAD, 75% dLAD (cypher DES x3 to LAD); b. ETT-myoview (12/08) 81% MPHR,  EF 64%, partially reversible inferoapical perfusion defect similar to prior study 12/07; c. 04/2016 MV: EF 59%, fixed anteroapical defect, no ischemia->Low risk.   Carotid arterial disease ([place])    a. 05/2016 Carotid U/S: 30-40% bilat dzs, f/u in 2 yrs.   Chest pain 04/13/2022   CVA (cerebral vascular accident) [facility])    a. 07/2010 - h/o CVA/TIA.   Diet-controlled type 2 diabetes mellitus (HCC)    HLD (hyperlipidemia)    HTN (hypertension)    PAT (paroxysmal atrial tachycardia)    a. 06/2016 Event monitor:  rare episodes of atrial tachycardia, longest 7 beats. No afib.    Past Surgical History:  Procedure Laterality Date   BACK SURGERY     CARDIAC CATHETERIZATION     CORONARY ANGIOPLASTY WITH STENT PLACEMENT     apical MI 5/07 with LHC showing 50% pCFX, 30% pRCA, 99% pLAD, 9%% mLAD, 75% dLAD. cypher DES x3 to LAD. ETT-myoview (12/08) 81% MPHR, 8'1, EF 64%, partially reversible inferoapical perfusion defect similar to prior study 12/07   CORONARY STENT INTERVENTION N/A 04/14/2022   Procedure: CORONARY STENT INTERVENTION;  Surgeon: Raymond Bush, MD;  Location: [place] [facility];  Service: Cardiovascular;  Laterality: N/A;   INTRAVASCULAR IMAGING/OCT N/A 04/14/2022   Procedure: INTRAVASCULAR IMAGING/OCT;  Surgeon: Raymond Bush, MD;  Location: [place] [facility];  Service: Cardiovascular;  Laterality: N/A;   LEFT HEART CATH AND CORONARY ANGIOGRAPHY N/A 04/13/2022   Procedure: LEFT HEART CATH AND CORONARY ANGIOGRAPHY;   Surgeon: Raymond Man, MD;  Location: [place] [facility];  Service: Cardiovascular;  Laterality: N/A;    Current Medications: Current Meds  Medication Sig   aspirin 81 MG tablet Take 81 mg by mouth daily.   atorvastatin (LIPITOR) 80 MG tablet Take 1 tablet (80 mg total) by mouth daily.   clopidogrel (PLAVIX) 75 MG tablet TAKE 1 TABLET BY MOUTH DAILY WITH BREAKFAST.   cyanocobalamin 1000 MCG tablet Take 1,000 mcg by mouth daily.   lisinopril (ZESTRIL) 20 MG tablet TAKE 1 TABLET BY MOUTH EVERY DAY   metFORMIN (GLUCOPHAGE) 1000 MG tablet Take 1,000 mg by mouth 2 (two) times daily with a meal.   metoprolol tartrate (LOPRESSOR) 25 MG tablet TAKE 1 TABLET BY MOUTH TWICE A DAY   pantoprazole (PROTONIX) 20 MG tablet Take 1 tablet (20 mg total) by mouth daily.   sertraline (ZOLOFT) 25 MG tablet Take 1 tablet (25 mg total) by mouth daily.   sitaGLIPtin (JANUVIA) 25 MG tablet Take 1 tablet by mouth daily.    Allergies:   Patient has no known allergies.   Social History   Socioeconomic History  Marital status: Married    Spouse name: Not on file   Number of children: Not on file   Years of education: Not on file   Highest education level: Not on file  Occupational History   Occupation: retired  Tobacco Use   Smoking status: Former    Packs/day: 1.00    Years: 25.00    Total pack years: 25.00    Types: Cigarettes    Quit date: 04/17/1985    Years since quitting: 37.0   Smokeless tobacco: Never  Vaping Use   Vaping Use: Never used  Substance and Sexual Activity   Alcohol use: No   Drug use: No   Sexual activity: Not on file  Other Topics Concern   Not on file  Social History Narrative   Part time work at [employer]. Does not regularly exercise.    Social Determinants of Health   Financial Resource Strain: Not on file  Food Insecurity: No Food Insecurity (04/13/2022)   Hunger Vital Sign    Worried About Running Out of Food in the Last Year: Never true    Ran Out of  Food in the Last Year: Never true  Transportation Needs: No Transportation Needs (04/13/2022)   PRAPARE - Hydrologist (Medical): No    Lack of Transportation (Non-Medical): No  Physical Activity: Not on file  Stress: Not on file  Social Connections: Not on file     Family History:  The patient's Family history is unknown by patient.  ROS:   12-point review of systems is negative unless otherwise noted in the HPI.   EKGs/Labs/Other Studies Reviewed:    Studies reviewed were summarized above. The additional studies were reviewed today:  LHC 04/14/2022: Conclusions: Severe three-vessel coronary artery disease, as detailed in Dr. Allison Quarry diagnostic catheterization report from yesterday. Successful PCI to proximal RCA using Onyx Frontier 2.5 x 18 mm drug-eluting stent with 0% residual stenosis and TIMI-3 flow. Successful PCI to proximal LAD stenosis using Onyx Frontier 3.5 x 15 mm drug-eluting stent with 0% residual stenosis and TIMI-3 flow. Successful angioplasty and drug-eluting stent placement to in-stent restenosis involving long segment of mid/distal LAD stenting.  The entire stent was angioplastied and an Onyx Frontier 2.0 x 18 mm drug-eluting stent placed at the distal edge, leading to reduction of stenosis from 70% to 20-30% in the proximal and mid portions and 0% residual stenosis at the distal edge with TIMI-3 flow throughout the vessel.   Recommendations: Continue indefinite dual antiplatelet therapy with aspirin and clopidogrel. Aggressive secondary prevention of coronary artery disease. Staged PCI to LCx disease.  Timing will depend on patient's symptoms.  If he is angina free and otherwise stable for discharge over the weekend, staged PCI could be arranged to be done in the next 1 to 2 weeks as an outpatient.  If he has recurrent angina or is otherwise still in the hospital, PCI to the LCx could be performed early next week. __________  [facility]  04/13/2022:   --------Severe 3 Vessel CAD--------------   Small caliber-codominant RCA: Prox RCA-1 lesion is 70% stenosis tapers to Prox RCA-2 lesion is 99% stenosed. (Likely Culprit Lesion #1)   Codominant circumflex:  Prox Cx lesion is 55% stenosed.  1st Mrg lesion is 70% stenosed. Just past 1st Mrf, Mid Cx to Dist Cx lesion is 80% stenosed with 70% stenosed side branch in 2nd Mrg.   Prox LAD to Mid LAD lesion is 80% stenosed - focal/concentric.  Mid LAD  to Dist LAD long stented segment is 70% stenosed. In-stent restenosis. Dist LAD lesion is 95% stenosed -> involving the distal stent edge into native vessel.   -------------------------------------------   LV end diastolic pressure is moderately elevated.   There is no aortic valve stenosis.     FOR FUTURE CARDIAC CATHETERIZATION, WOULD NOT RECOMMEND USING RIGHT RADIAL ACCESS DUE TO THE EXTREME TORTUOSITY OF THE SUBCLAVIAN-INNOMINATE ARTERY AND AORTIC ARCH.   PLAN OF CARE:  Continue with plan to admit overnight.  Will restart IV heparin 2 hours after sheath removal. \            Will need to meet with patient and family as well as have a heart team discussion with interventional cardiology and CVTS to determine best treatment options.  If he were to need PCI, may be best for him to be transferred to [facility] as the procedure would like to be prolonged and difficult.Potential needs to be noted to the patient's mental status, and baseline functional status. __________  2D echo 04/13/2022: 1. Left ventricular ejection fraction, by estimation, is 55 to 60%. The  left ventricle has normal function. The left ventricle demonstrates  regional wall motion abnormalities (hypokinesis of teh basal septal  region). There is mild left ventricular  hypertrophy. Left ventricular diastolic parameters are consistent with  Grade I diastolic dysfunction (impaired relaxation).   2. Right ventricular systolic function is normal. The right ventricular  size is  normal. There is normal pulmonary artery systolic pressure. The  estimated right ventricular systolic pressure is 16.1 mmHg.   3. The mitral valve is normal in structure. No evidence of mitral valve  regurgitation. No evidence of mitral stenosis.   4. The aortic valve is normal in structure. Aortic valve regurgitation is  mild. Aortic valve sclerosis is present, with no evidence of aortic valve  stenosis.   5. The inferior vena cava is normal in size with greater than 50%  respiratory variability, suggesting right atrial pressure of 3 mmHg.  __________  Carotid artery ultrasound 12/06/2018: Summary:  Right Carotid: Velocities in the right ICA are consistent with a 1-39%  stenosis.                Non-hemodynamically significant plaque <50% noted in the  CCA. The ECA appears <50% stenosed.   Left Carotid: Velocities in the left ICA are consistent with a 1-39%  stenosis.               Non-hemodynamically significant plaque <50% noted in the  CCA. The ECA appears >50% stenosed.   Vertebrals:  Bilateral vertebral arteries demonstrate antegrade flow.  Subclavians: Normal flow hemodynamics were seen in bilateral subclavian arteries.  __________  Elwyn Reach patch 05/2016: Normal sinus rhythm Min HR of 40 bpm, max HR of 171 bpm, and avg HR of 69 bpm.    7 Atrial tachycardia runs occurred, the longest lasting 7 beats with an avg rate of 105 bpm.   Isolated SVEs were rare (<1.0%), SVE Couplets were rare (<1.0%), and SVE Triplets were rare (<1.0%).  Isolated VEs were rare (<1.0%), VE Couplets were rare (<1.0%), and no VE Triplets were present. __________  Carlton Adam MPI 05/02/2016: Pharmacological myocardial perfusion imaging study with no significant  Ischemia Small region of predominantly fixed defect in the anteroapical wall of mild severity Normal wall motion (GI uptake artifact noted),  EF estimated at 59% No EKG changes concerning for ischemia at peak stress or in recovery. Severe hypertension  noted, SBP 180 Low  risk scan   EKG:  EKG is ordered today.  The EKG ordered today demonstrates NSR, 73 bpm, improved nonspecific ST-T changes when compared to prior tracing  Recent Labs: 04/12/2022: ALT 17 04/13/2022: Hemoglobin 11.9; Platelets 257 04/14/2022: Magnesium 1.9 04/15/2022: BUN 19; Creatinine, Ser 1.08; Potassium 4.2; Sodium 135  Recent Lipid Panel    Component Value Date/Time   CHOL 205 (H) 04/13/2022 0430   CHOL 132 05/29/2019 1113   CHOL 138 12/28/2012 0506   TRIG 257 (H) 04/13/2022 0430   TRIG 197 12/28/2012 0506   TRIG 111 08/06/2008 0000   HDL 40 (L) 04/13/2022 0430   HDL 34 (L) 05/29/2019 1113   HDL 34 (L) 12/28/2012 0506   CHOLHDL 5.1 04/13/2022 0430   VLDL 51 (H) 04/13/2022 0430   VLDL 39 12/28/2012 0506   LDLCALC 114 (H) 04/13/2022 0430   LDLCALC 64 05/29/2019 1113   LDLCALC 65 12/28/2012 0506    PHYSICAL EXAM:    VS:  BP (!) 116/58 (BP Location: Left Arm, Patient Position: Sitting, Cuff Size: Normal)   Pulse 73   Ht '5\' 7"'$  (1.702 m)   Wt 153 lb 6.4 oz (69.6 kg)   SpO2 99%   BMI 24.03 kg/m   BMI: Body mass index is 24.03 kg/m.  Physical Exam Vitals reviewed.  Constitutional:      Appearance: He is well-developed.  HENT:     Head: Normocephalic and atraumatic.  Eyes:     General:        Right eye: No discharge.        Left eye: No discharge.  Neck:     Vascular: No JVD.  Cardiovascular:     Rate and Rhythm: Normal rate and regular rhythm.     Heart sounds: Normal heart sounds, S1 normal and S2 normal. Heart sounds not distant. No midsystolic click and no opening snap. No murmur heard.    No friction rub.     Comments: Right radial arteriotomy site is without active bleeding, bruising, swelling, warmth, erythema, or tenderness to palpation.  Radial pulse 2+ proximal and distal to the arteriotomy site.  Right femoral arteriotomy site is well-healed without active bleeding, bruising, swelling, warmth, erythema, or tenderness to palpation.  No  bruit. Pulmonary:     Effort: Pulmonary effort is normal. No respiratory distress.     Breath sounds: Normal breath sounds. No decreased breath sounds, wheezing or rales.  Chest:     Chest wall: No tenderness.  Abdominal:     General: There is no distension.  Musculoskeletal:     Cervical back: Normal range of motion.     Right lower leg: No edema.     Left lower leg: No edema.  Skin:    General: Skin is warm and dry.     Nails: There is no clubbing.  Neurological:     Mental Status: He is alert and oriented to person, place, and time.  Psychiatric:        Speech: Speech normal.        Behavior: Behavior normal.        Thought Content: Thought content normal.        Judgment: Judgment normal.     Wt Readings from Last 3 Encounters:  04/26/22 153 lb 6.4 oz (69.6 kg)  04/12/22 165 lb (74.8 kg)  05/31/21 159 lb 2 oz (72.2 kg)     ASSESSMENT & PLAN:   Multivessel CAD involving the native coronaries with stable angina: Status post  recent complex PCI x 2 to the LAD and RCA earlier this month in the setting of NSTEMI with residual stenosis involving the LCx with planned staged PCI.  He is without symptoms of progressive angina or cardiac decompensation.  Continue aggressive risk factor modification and secondary prevention including DAPT with ASA and clopidogrel without interruption.  He will otherwise continue atorvastatin, lisinopril, and metoprolol.  Schedule LHC for staged intervention of the LCx with interventional cardiology.  Recommend cardiac rehab thereafter.  HTN: Blood pressure is well-controlled in the office today.  He remains on lisinopril and metoprolol.  HLD: LDL 114 in 04/2022 with goal LDL being less than 55.  Now on atorvastatin 80 mg.  Will need follow-up fasting lipid panel and LFT in 2 months with recommendation to escalate lipid therapy as indicated to achieve target LDL.  Atrial tachycardia: Quiescent on metoprolol.  Bilateral carotid artery disease: 1 to 39%  bilateral ICA stenosis on last carotid artery ultrasound.  DAPT as outlined above with continuation of atorvastatin.  Schedule carotid artery ultrasound.  Bilateral claudication: Schedule ABIs and lower extremity arterial ultrasound.  He remains on DAPT and statin as outlined above.  Given his advanced age, frail state, and sedentary lifestyle, I do wonder if some of his symptoms are related to deconditioning.  History of TIA/CVA: No new symptoms.  Aspirin, Plavix, and atorvastatin as outlined above.  Pulmonary nodule: Hospitalist advised patient to follow-up with PCP.  Patient/wife also advised to follow-up with PCP in the office today.  Dark urine: Noted for the first day or 2 following hospital discharge with subsequent resolution.  Unclear if this was hematuria or not.  Advised patient's wife to contact PCP for hospital follow-up and to discuss potential hematuria.  Check CBC and BMP.   Shared Decision Making/Informed Consent{  The risks [stroke (1 in 1000), death (1 in 1000), kidney failure [usually temporary] (1 in 500), bleeding (1 in 200), allergic reaction [possibly serious] (1 in 200)], benefits (diagnostic support and management of coronary artery disease) and alternatives of a cardiac catheterization were discussed in detail with Mr. Bolander and he is willing to proceed.     Disposition: F/u with Dr. Rockey Situ or an APP after staged PCI.   Medication Adjustments/Labs and Tests Ordered: Current medicines are reviewed at length with the patient today.  Concerns regarding medicines are outlined above. Medication changes, Labs and Tests ordered today are summarized above and listed in the Patient Instructions accessible in Encounters.   SignedChristell Faith, PA-C 04/26/2022 11:26 AM     [facility] [address] [place], [place] [address] [phone]

## 2022-04-24 NOTE — H&P (View-Only) (Signed)
Cardiology Office Note    Date:  04/26/2022   ID:  Raymond Sampson, DOB 01-Sep-1940, MRN [id number]  PCP:  [name], MD  Cardiologist:  Ida Rogue, MD  Electrophysiologist:  None   Chief Complaint: Hospital follow-up  History of Present Illness:   Raymond Sampson is a 82 y.o. male with history of CAD with apical MI in 2007 status post stenting x 3 to the LAD with NSTEMI in 04/2022 status post PCI and DES to the LAD and RCA as outlined below, TIA/CVA in 2012, atrial tachycardia, bilateral carotid artery disease, dementia, DM2, HTN, HLD, and prior tobacco use who presents for hospital follow-up as outlined below.  He was admitted  to the hospital in 08/2005 with an MI that was treated with PCI/DES x 3 to the LAD. Stress test in 2013 showed no significant ischemia, EF 68%, with severely elevated BP. Echo in 2014 showed an EF of 40-98%, diastolic dysfunction, normal RVSF and cavity size, normal RVSP. He was seen in early 2018 with palpitations and abnormal EKG leading to a Myoview in 04/2016 that was low risk. Outpatient cardiac monitoring in 05/2016 showed NSR with an average heart rate of 69 bpm (40-171 bpm), 7 atrial tachycardic runs occurred with the longest lasting 7 beats with an average rate of 105 bpm with no evidence of Afib.  Most recent carotid artery ultrasound from 12/2018 showed 1 to 39% bilateral ICA stenosis with bilateral vertebral arteries demonstrating antegrade flow and normal flow hemodynamics in the bilateral subclavian arteries.  He was admitted to [facility] from 1/10 through 04/15/2022 with an NSTEMI.  High-sensitivity troponin peaked at 1422.  CTA of the chest showed no evidence of PE with an incidental nodular density noted in the left lung base as well as mild right hilar lymphadenopathy, right adrenal adenoma, and aortic atherosclerosis.  Echo showed an EF of 55 to 60%, mild LVH, hypokinesis of the basal septal region, grade 1 diastolic dysfunction, normal RV systolic  function and ventricular cavity size, normal PASP, aortic valve sclerosis without evidence of stenosis, and normal estimated right atrial pressure.  LHC on 04/13/2022 showed severe three-vessel CAD as outlined below.  After further discussion, he was not felt to be a CABG candidate given advanced age and dementia with recommendation to proceed with complex PCI intervention.  He subsequently underwent repeat LHC on 04/14/2022 that again showed severe three-vessel CAD.  He underwent successful PCI/DES to the proximal LAD, PCI/DES to the proximal RCA, and PCI/DES to in-stent restenosis involving the long segment of mid/distal LAD stenting.  The entire stent was angioplastied and Onyx Frontier 2.0 x 18 mm drug-eluting stent was placed at the distal edge leading to reduction of stenosis from 70% to 20 to 30% in the proximal and mid portions with 0% residual stenosis at the distal edge and TIMI-3 flow throughout the vessel.  Following these interventions, he remained chest pain-free with family preferring the patient be discharged with planned staged PCI to the LCx be pursued in the outpatient setting.  He comes in accompanied by his wife today and is doing well from a cardiac perspective.  No angina or decompensation.  No dyspnea, palpitations, dizziness, presyncope, or syncope.  No falls, hematochezia, or melena.  His wife does report a day or 2 of dark urine following his hospital discharge that subsequently resolved without intervention and has not recurred since.  He has not missed any doses of DAPT since hospital discharge.  No cardiac cath site complications  along the right upper extremity or right groin.  He does also report some anterior bilateral thigh pain and weakness with prolonged ambulation without rest pain or nonhealing wounds.  His wife is also concerned about potential progression of prior mild carotid artery stenosis.  They remain interested in moving forward with planned staged PCI of the  LCx.   Labs independently reviewed: 04/2022 - potassium 4.2, BUN 19, serum creatinine 1.08, magnesium 1.9, Hgb 11.9, PLT 257, LP(a) 23, TC 205, TG 257, HDL 40, LDL 114, A1c 6.8, albumin 4.0, AST/ALT normal 05/2019 - TSH normal  Past Medical History:  Diagnosis Date   Arthritic-like pain    CAD (coronary artery disease)    a. apical MI 5/07 with LHC showing 50% pCFX, 30% pRCA, 99% pLAD, 99% mLAD, 75% dLAD (cypher DES x3 to LAD); b. ETT-myoview (12/08) 81% MPHR,  EF 64%, partially reversible inferoapical perfusion defect similar to prior study 12/07; c. 04/2016 MV: EF 59%, fixed anteroapical defect, no ischemia->Low risk.   Carotid arterial disease ([place])    a. 05/2016 Carotid U/S: 30-40% bilat dzs, f/u in 2 yrs.   Chest pain 04/13/2022   CVA (cerebral vascular accident) [facility])    a. 07/2010 - h/o CVA/TIA.   Diet-controlled type 2 diabetes mellitus (HCC)    HLD (hyperlipidemia)    HTN (hypertension)    PAT (paroxysmal atrial tachycardia)    a. 06/2016 Event monitor:  rare episodes of atrial tachycardia, longest 7 beats. No afib.    Past Surgical History:  Procedure Laterality Date   BACK SURGERY     CARDIAC CATHETERIZATION     CORONARY ANGIOPLASTY WITH STENT PLACEMENT     apical MI 5/07 with LHC showing 50% pCFX, 30% pRCA, 99% pLAD, 9%% mLAD, 75% dLAD. cypher DES x3 to LAD. ETT-myoview (12/08) 81% MPHR, 8'1, EF 64%, partially reversible inferoapical perfusion defect similar to prior study 12/07   CORONARY STENT INTERVENTION N/A 04/14/2022   Procedure: CORONARY STENT INTERVENTION;  Surgeon: Nelva Bush, MD;  Location: [place] CV LAB;  Service: Cardiovascular;  Laterality: N/A;   INTRAVASCULAR IMAGING/OCT N/A 04/14/2022   Procedure: INTRAVASCULAR IMAGING/OCT;  Surgeon: Nelva Bush, MD;  Location: [place] CV LAB;  Service: Cardiovascular;  Laterality: N/A;   LEFT HEART CATH AND CORONARY ANGIOGRAPHY N/A 04/13/2022   Procedure: LEFT HEART CATH AND CORONARY ANGIOGRAPHY;   Surgeon: [name], MD;  Location: [place] CV LAB;  Service: Cardiovascular;  Laterality: N/A;    Current Medications: Current Meds  Medication Sig   aspirin 81 MG tablet Take 81 mg by mouth daily.   atorvastatin (LIPITOR) 80 MG tablet Take 1 tablet (80 mg total) by mouth daily.   clopidogrel (PLAVIX) 75 MG tablet TAKE 1 TABLET BY MOUTH DAILY WITH BREAKFAST.   cyanocobalamin 1000 MCG tablet Take 1,000 mcg by mouth daily.   lisinopril (ZESTRIL) 20 MG tablet TAKE 1 TABLET BY MOUTH EVERY DAY   metFORMIN (GLUCOPHAGE) 1000 MG tablet Take 1,000 mg by mouth 2 (two) times daily with a meal.   metoprolol tartrate (LOPRESSOR) 25 MG tablet TAKE 1 TABLET BY MOUTH TWICE A DAY   pantoprazole (PROTONIX) 20 MG tablet Take 1 tablet (20 mg total) by mouth daily.   sertraline (ZOLOFT) 25 MG tablet Take 1 tablet (25 mg total) by mouth daily.   sitaGLIPtin (JANUVIA) 25 MG tablet Take 1 tablet by mouth daily.    Allergies:   Patient has no known allergies.   Social History   Socioeconomic History  Marital status: Married    Spouse name: Not on file   Number of children: Not on file   Years of education: Not on file   Highest education level: Not on file  Occupational History   Occupation: retired  Tobacco Use   Smoking status: Former    Packs/day: 1.00    Years: 25.00    Total pack years: 25.00    Types: Cigarettes    Quit date: 04/17/1985    Years since quitting: 37.0   Smokeless tobacco: Never  Vaping Use   Vaping Use: Never used  Substance and Sexual Activity   Alcohol use: No   Drug use: No   Sexual activity: Not on file  Other Topics Concern   Not on file  Social History Narrative   Part time work at [employer]. Does not regularly exercise.    Social Determinants of Health   Financial Resource Strain: Not on file  Food Insecurity: No Food Insecurity (04/13/2022)   Hunger Vital Sign    Worried About Running Out of Food in the Last Year: Never true    Ran Out of  Food in the Last Year: Never true  Transportation Needs: No Transportation Needs (04/13/2022)   PRAPARE - Hydrologist (Medical): No    Lack of Transportation (Non-Medical): No  Physical Activity: Not on file  Stress: Not on file  Social Connections: Not on file     Family History:  The patient's Family history is unknown by patient.  ROS:   12-point review of systems is negative unless otherwise noted in the HPI.   EKGs/Labs/Other Studies Reviewed:    Studies reviewed were summarized above. The additional studies were reviewed today:  LHC 04/14/2022: Conclusions: Severe three-vessel coronary artery disease, as detailed in Dr. Allison Quarry diagnostic catheterization report from yesterday. Successful PCI to proximal RCA using Onyx Frontier 2.5 x 18 mm drug-eluting stent with 0% residual stenosis and TIMI-3 flow. Successful PCI to proximal LAD stenosis using Onyx Frontier 3.5 x 15 mm drug-eluting stent with 0% residual stenosis and TIMI-3 flow. Successful angioplasty and drug-eluting stent placement to in-stent restenosis involving long segment of mid/distal LAD stenting.  The entire stent was angioplastied and an Onyx Frontier 2.0 x 18 mm drug-eluting stent placed at the distal edge, leading to reduction of stenosis from 70% to 20-30% in the proximal and mid portions and 0% residual stenosis at the distal edge with TIMI-3 flow throughout the vessel.   Recommendations: Continue indefinite dual antiplatelet therapy with aspirin and clopidogrel. Aggressive secondary prevention of coronary artery disease. Staged PCI to LCx disease.  Timing will depend on patient's symptoms.  If he is angina free and otherwise stable for discharge over the weekend, staged PCI could be arranged to be done in the next 1 to 2 weeks as an outpatient.  If he has recurrent angina or is otherwise still in the hospital, PCI to the LCx could be performed early next week. __________  [facility]  04/13/2022:   --------Severe 3 Vessel CAD--------------   Small caliber-codominant RCA: Prox RCA-1 lesion is 70% stenosis tapers to Prox RCA-2 lesion is 99% stenosed. (Likely Culprit Lesion #1)   Codominant circumflex:  Prox Cx lesion is 55% stenosed.  1st Mrg lesion is 70% stenosed. Just past 1st Mrf, Mid Cx to Dist Cx lesion is 80% stenosed with 70% stenosed side branch in 2nd Mrg.   Prox LAD to Mid LAD lesion is 80% stenosed - focal/concentric.  Mid LAD  to Dist LAD long stented segment is 70% stenosed. In-stent restenosis. Dist LAD lesion is 95% stenosed -> involving the distal stent edge into native vessel.   -------------------------------------------   LV end diastolic pressure is moderately elevated.   There is no aortic valve stenosis.     FOR FUTURE CARDIAC CATHETERIZATION, WOULD NOT RECOMMEND USING RIGHT RADIAL ACCESS DUE TO THE EXTREME TORTUOSITY OF THE SUBCLAVIAN-INNOMINATE ARTERY AND AORTIC ARCH.   PLAN OF CARE:  Continue with plan to admit overnight.  Will restart IV heparin 2 hours after sheath removal. \            Will need to meet with patient and family as well as have a heart team discussion with interventional cardiology and CVTS to determine best treatment options.  If he were to need PCI, may be best for him to be transferred to [facility] as the procedure would like to be prolonged and difficult.Potential needs to be noted to the patient's mental status, and baseline functional status. __________  2D echo 04/13/2022: 1. Left ventricular ejection fraction, by estimation, is 55 to 60%. The  left ventricle has normal function. The left ventricle demonstrates  regional wall motion abnormalities (hypokinesis of teh basal septal  region). There is mild left ventricular  hypertrophy. Left ventricular diastolic parameters are consistent with  Grade I diastolic dysfunction (impaired relaxation).   2. Right ventricular systolic function is normal. The right ventricular  size is  normal. There is normal pulmonary artery systolic pressure. The  estimated right ventricular systolic pressure is 29.9 mmHg.   3. The mitral valve is normal in structure. No evidence of mitral valve  regurgitation. No evidence of mitral stenosis.   4. The aortic valve is normal in structure. Aortic valve regurgitation is  mild. Aortic valve sclerosis is present, with no evidence of aortic valve  stenosis.   5. The inferior vena cava is normal in size with greater than 50%  respiratory variability, suggesting right atrial pressure of 3 mmHg.  __________  Carotid artery ultrasound 12/06/2018: Summary:  Right Carotid: Velocities in the right ICA are consistent with a 1-39%  stenosis.                Non-hemodynamically significant plaque <50% noted in the  CCA. The ECA appears <50% stenosed.   Left Carotid: Velocities in the left ICA are consistent with a 1-39%  stenosis.               Non-hemodynamically significant plaque <50% noted in the  CCA. The ECA appears >50% stenosed.   Vertebrals:  Bilateral vertebral arteries demonstrate antegrade flow.  Subclavians: Normal flow hemodynamics were seen in bilateral subclavian arteries.  __________  Elwyn Reach patch 05/2016: Normal sinus rhythm Min HR of 40 bpm, max HR of 171 bpm, and avg HR of 69 bpm.    7 Atrial tachycardia runs occurred, the longest lasting 7 beats with an avg rate of 105 bpm.   Isolated SVEs were rare (<1.0%), SVE Couplets were rare (<1.0%), and SVE Triplets were rare (<1.0%).  Isolated VEs were rare (<1.0%), VE Couplets were rare (<1.0%), and no VE Triplets were present. __________  Carlton Adam MPI 05/02/2016: Pharmacological myocardial perfusion imaging study with no significant  Ischemia Small region of predominantly fixed defect in the anteroapical wall of mild severity Normal wall motion (GI uptake artifact noted),  EF estimated at 59% No EKG changes concerning for ischemia at peak stress or in recovery. Severe hypertension  noted, SBP 180 Low  risk scan   EKG:  EKG is ordered today.  The EKG ordered today demonstrates NSR, 73 bpm, improved nonspecific ST-T changes when compared to prior tracing  Recent Labs: 04/12/2022: ALT 17 04/13/2022: Hemoglobin 11.9; Platelets 257 04/14/2022: Magnesium 1.9 04/15/2022: BUN 19; Creatinine, Ser 1.08; Potassium 4.2; Sodium 135  Recent Lipid Panel    Component Value Date/Time   CHOL 205 (H) 04/13/2022 0430   CHOL 132 05/29/2019 1113   CHOL 138 12/28/2012 0506   TRIG 257 (H) 04/13/2022 0430   TRIG 197 12/28/2012 0506   TRIG 111 08/06/2008 0000   HDL 40 (L) 04/13/2022 0430   HDL 34 (L) 05/29/2019 1113   HDL 34 (L) 12/28/2012 0506   CHOLHDL 5.1 04/13/2022 0430   VLDL 51 (H) 04/13/2022 0430   VLDL 39 12/28/2012 0506   LDLCALC 114 (H) 04/13/2022 0430   LDLCALC 64 05/29/2019 1113   LDLCALC 65 12/28/2012 0506    PHYSICAL EXAM:    VS:  BP (!) 116/58 (BP Location: Left Arm, Patient Position: Sitting, Cuff Size: Normal)   Pulse 73   Ht '5\' 7"'$  (1.702 m)   Wt 153 lb 6.4 oz (69.6 kg)   SpO2 99%   BMI 24.03 kg/m   BMI: Body mass index is 24.03 kg/m.  Physical Exam Vitals reviewed.  Constitutional:      Appearance: He is well-developed.  HENT:     Head: Normocephalic and atraumatic.  Eyes:     General:        Right eye: No discharge.        Left eye: No discharge.  Neck:     Vascular: No JVD.  Cardiovascular:     Rate and Rhythm: Normal rate and regular rhythm.     Heart sounds: Normal heart sounds, S1 normal and S2 normal. Heart sounds not distant. No midsystolic click and no opening snap. No murmur heard.    No friction rub.     Comments: Right radial arteriotomy site is without active bleeding, bruising, swelling, warmth, erythema, or tenderness to palpation.  Radial pulse 2+ proximal and distal to the arteriotomy site.  Right femoral arteriotomy site is well-healed without active bleeding, bruising, swelling, warmth, erythema, or tenderness to palpation.  No  bruit. Pulmonary:     Effort: Pulmonary effort is normal. No respiratory distress.     Breath sounds: Normal breath sounds. No decreased breath sounds, wheezing or rales.  Chest:     Chest wall: No tenderness.  Abdominal:     General: There is no distension.  Musculoskeletal:     Cervical back: Normal range of motion.     Right lower leg: No edema.     Left lower leg: No edema.  Skin:    General: Skin is warm and dry.     Nails: There is no clubbing.  Neurological:     Mental Status: He is alert and oriented to person, place, and time.  Psychiatric:        Speech: Speech normal.        Behavior: Behavior normal.        Thought Content: Thought content normal.        Judgment: Judgment normal.     Wt Readings from Last 3 Encounters:  04/26/22 153 lb 6.4 oz (69.6 kg)  04/12/22 165 lb (74.8 kg)  05/31/21 159 lb 2 oz (72.2 kg)     ASSESSMENT & PLAN:   Multivessel CAD involving the native coronaries with stable angina: Status post  recent complex PCI x 2 to the LAD and RCA earlier this month in the setting of NSTEMI with residual stenosis involving the LCx with planned staged PCI.  He is without symptoms of progressive angina or cardiac decompensation.  Continue aggressive risk factor modification and secondary prevention including DAPT with ASA and clopidogrel without interruption.  He will otherwise continue atorvastatin, lisinopril, and metoprolol.  Schedule LHC for staged intervention of the LCx with interventional cardiology.  Recommend cardiac rehab thereafter.  HTN: Blood pressure is well-controlled in the office today.  He remains on lisinopril and metoprolol.  HLD: LDL 114 in 04/2022 with goal LDL being less than 55.  Now on atorvastatin 80 mg.  Will need follow-up fasting lipid panel and LFT in 2 months with recommendation to escalate lipid therapy as indicated to achieve target LDL.  Atrial tachycardia: Quiescent on metoprolol.  Bilateral carotid artery disease: 1 to 39%  bilateral ICA stenosis on last carotid artery ultrasound.  DAPT as outlined above with continuation of atorvastatin.  Schedule carotid artery ultrasound.  Bilateral claudication: Schedule ABIs and lower extremity arterial ultrasound.  He remains on DAPT and statin as outlined above.  Given his advanced age, frail state, and sedentary lifestyle, I do wonder if some of his symptoms are related to deconditioning.  History of TIA/CVA: No new symptoms.  Aspirin, Plavix, and atorvastatin as outlined above.  Pulmonary nodule: Hospitalist advised patient to follow-up with PCP.  Patient/wife also advised to follow-up with PCP in the office today.  Dark urine: Noted for the first day or 2 following hospital discharge with subsequent resolution.  Unclear if this was hematuria or not.  Advised patient's wife to contact PCP for hospital follow-up and to discuss potential hematuria.  Check CBC and BMP.   Shared Decision Making/Informed Consent{  The risks [stroke (1 in 1000), death (1 in 1000), kidney failure [usually temporary] (1 in 500), bleeding (1 in 200), allergic reaction [possibly serious] (1 in 200)], benefits (diagnostic support and management of coronary artery disease) and alternatives of a cardiac catheterization were discussed in detail with Mr. Pfiffner and he is willing to proceed.     Disposition: F/u with Dr. Rockey Situ or an APP after staged PCI.   Medication Adjustments/Labs and Tests Ordered: Current medicines are reviewed at length with the patient today.  Concerns regarding medicines are outlined above. Medication changes, Labs and Tests ordered today are summarized above and listed in the Patient Instructions accessible in Encounters.   SignedChristell Faith, PA-C 04/26/2022 11:26 AM     [facility] [address] [place] [address] [place], [place] [address] [phone]

## 2022-04-26 ENCOUNTER — Encounter: Payer: Self-pay | Admitting: Physician Assistant

## 2022-04-26 ENCOUNTER — Other Ambulatory Visit
Admission: RE | Admit: 2022-04-26 | Discharge: 2022-04-26 | Disposition: A | Discharge status: Home / Self Care | Payer: PPO | Attending: Physician Assistant | Admitting: Physician Assistant

## 2022-04-26 ENCOUNTER — Ambulatory Visit: Payer: PPO | Attending: Physician Assistant | Admitting: Physician Assistant

## 2022-04-26 VITALS — BP 116/58 | HR 73 | Ht 67.0 in | Wt 153.4 lb

## 2022-04-26 DIAGNOSIS — R911 Solitary pulmonary nodule: Secondary | ICD-10-CM

## 2022-04-26 DIAGNOSIS — R82998 Other abnormal findings in urine: Secondary | ICD-10-CM

## 2022-04-26 DIAGNOSIS — I2510 Atherosclerotic heart disease of native coronary artery without angina pectoris: Secondary | ICD-10-CM

## 2022-04-26 DIAGNOSIS — I4719 Other supraventricular tachycardia: Secondary | ICD-10-CM

## 2022-04-26 DIAGNOSIS — I6523 Occlusion and stenosis of bilateral carotid arteries: Secondary | ICD-10-CM

## 2022-04-26 DIAGNOSIS — I10 Essential (primary) hypertension: Secondary | ICD-10-CM

## 2022-04-26 DIAGNOSIS — E785 Hyperlipidemia, unspecified: Secondary | ICD-10-CM | POA: Diagnosis not present

## 2022-04-26 DIAGNOSIS — I739 Peripheral vascular disease, unspecified: Secondary | ICD-10-CM

## 2022-04-26 DIAGNOSIS — Z8673 Personal history of transient ischemic attack (TIA), and cerebral infarction without residual deficits: Secondary | ICD-10-CM

## 2022-04-26 LAB — CBC
HCT: 38.9 % — ABNORMAL LOW (ref 39.0–52.0)
Hemoglobin: 12.5 g/dL — ABNORMAL LOW (ref 13.0–17.0)
MCH: 29.5 pg (ref 26.0–34.0)
MCHC: 32.1 g/dL (ref 30.0–36.0)
MCV: 91.7 fL (ref 80.0–100.0)
Platelets: 343 10*3/uL (ref 150–400)
RBC: 4.24 MIL/uL (ref 4.22–5.81)
RDW: 13 % (ref 11.5–15.5)
WBC: 9.3 10*3/uL (ref 4.0–10.5)
nRBC: 0 % (ref 0.0–0.2)

## 2022-04-26 LAB — BASIC METABOLIC PANEL
Anion gap: 13 (ref 5–15)
BUN: 26 mg/dL — ABNORMAL HIGH (ref 8–23)
CO2: 24 mmol/L (ref 22–32)
Calcium: 8.9 mg/dL (ref 8.9–10.3)
Chloride: 99 mmol/L (ref 98–111)
Creatinine, Ser: 1.22 mg/dL (ref 0.61–1.24)
GFR, Estimated: 60 mL/min — ABNORMAL LOW (ref >60)
Glucose, Bld: 112 mg/dL — ABNORMAL HIGH (ref 70–99)
Potassium: 4.4 mmol/L (ref 3.5–5.1)
Sodium: 136 mmol/L (ref 135–145)

## 2022-04-26 NOTE — Patient Instructions (Signed)
Medication Instructions:  Your physician recommends that you continue on your current medications as directed. Please refer to the Current Medication list given to you today.  *If you need a refill on your cardiac medications before your next appointment, please call your pharmacy*   Lab Work: Your physician recommends you go to the medical mall to have lab work completed  If you have labs (blood work) drawn today and your tests are completely normal, you will receive your results only by: MyChart Message (if you have MyChart) OR A paper copy in the mail If you have any lab test that is abnormal or we need to change your treatment, we will call you to review the results.   Testing/Procedures: [facility] Cardiac Cath Instructions  You are scheduled for a Cardiac Cath on: Tuesday 05/02/2022 Please arrive at 6:30 am on the day of your procedure Please expect a call from our [facility] to pre-register you Do not eat/drink anything after midnight Someone will need to drive you home It is recommended someone be with you for the first 24 hours after your procedure Wear clothes that are easy to get on/off and wear slip on shoes if possible   Medications bring a current list of all medications with you   ___ Do not take these medications before your procedure: Hold lisinopril the morning of procedure. Hold Metformin 24 hours before and 48 hours after procedure.   Day of your procedure: Arrive at the [facility] entrance.  Free valet service is available.  After entering the [place] please check-in at the registration desk (1st desk on your right) to receive your armband. After receiving your armband someone will escort you to the cardiac cath/special procedures waiting area.  The usual length of stay after your procedure is about 2 to 3 hours.  This can vary.  If you have any questions, please call our office at [phone], or you may call the cardiac cath lab at [facility]  directly at [phone]  Your physician has requested that you have a carotid duplex. This test is an ultrasound of the carotid arteries in your neck. It looks at blood flow through these arteries that supply the brain with blood. Allow one hour for this exam. There are no restrictions or special instructions.   Your physician has requested that you have a lower extremity arterial exercise duplex. During this test, exercise and ultrasound are used to evaluate arterial blood flow in the legs. Allow one hour for this exam. There are no restrictions or special instructions.   Your physician has requested that you have an ankle brachial index (ABI). During this test an ultrasound and blood pressure cuff are used to evaluate the arteries that supply the arms and legs with blood. Allow thirty minutes for this exam. There are no restrictions or special instructions.    Follow-Up: At [facility], you and your health needs are our priority.  As part of our continuing mission to provide you with exceptional heart care, we have created designated Provider Care Teams.  These Care Teams include your primary Cardiologist (physician) and Advanced Practice Providers (APPs -  Physician Assistants and Nurse Practitioners) who all work together to provide you with the care you need, when you need it.  We recommend signing up for the patient portal called "MyChart".  Sign up information is provided on this After Visit Summary.  MyChart is used to connect with patients for Virtual Visits (Telemedicine).  Patients are able  to view lab/test results, encounter notes, upcoming appointments, etc.  Non-urgent messages can be sent to your provider as well.   To learn more about what you can do with MyChart, go to [email or web address].    Your next appointment:   1 week(s) after patient's cath procedure  Provider:   You may see Ida Rogue, MD or one of the following Advanced Practice Providers on your  designated Care Team:   Murray Hodgkins, NP Christell Faith, PA-C Cadence Kathlen Mody, PA-C Gerrie Nordmann, NP

## 2022-05-02 ENCOUNTER — Observation Stay
Admission: RE | Admit: 2022-05-02 | Discharge: 2022-05-03 | Disposition: A | Discharge status: Home / Self Care | Payer: PPO | Attending: Internal Medicine | Admitting: Internal Medicine

## 2022-05-02 ENCOUNTER — Other Ambulatory Visit: Payer: Self-pay

## 2022-05-02 ENCOUNTER — Encounter: Payer: Self-pay | Admitting: Internal Medicine

## 2022-05-02 ENCOUNTER — Encounter
Admission: RE | Disposition: A | Discharge status: Home / Self Care | Payer: Self-pay | Source: Home / Self Care | Attending: Internal Medicine

## 2022-05-02 DIAGNOSIS — E119 Type 2 diabetes mellitus without complications: Secondary | ICD-10-CM | POA: Insufficient documentation

## 2022-05-02 DIAGNOSIS — I2510 Atherosclerotic heart disease of native coronary artery without angina pectoris: Secondary | ICD-10-CM | POA: Insufficient documentation

## 2022-05-02 DIAGNOSIS — Z955 Presence of coronary angioplasty implant and graft: Secondary | ICD-10-CM | POA: Diagnosis not present

## 2022-05-02 DIAGNOSIS — I214 Non-ST elevation (NSTEMI) myocardial infarction: Secondary | ICD-10-CM | POA: Diagnosis not present

## 2022-05-02 DIAGNOSIS — F0390 Unspecified dementia without behavioral disturbance: Secondary | ICD-10-CM | POA: Diagnosis not present

## 2022-05-02 DIAGNOSIS — E785 Hyperlipidemia, unspecified: Secondary | ICD-10-CM | POA: Diagnosis present

## 2022-05-02 DIAGNOSIS — Z7982 Long term (current) use of aspirin: Secondary | ICD-10-CM | POA: Diagnosis not present

## 2022-05-02 DIAGNOSIS — Z8673 Personal history of transient ischemic attack (TIA), and cerebral infarction without residual deficits: Secondary | ICD-10-CM | POA: Diagnosis not present

## 2022-05-02 DIAGNOSIS — I10 Essential (primary) hypertension: Secondary | ICD-10-CM | POA: Diagnosis not present

## 2022-05-02 DIAGNOSIS — Z7902 Long term (current) use of antithrombotics/antiplatelets: Secondary | ICD-10-CM | POA: Insufficient documentation

## 2022-05-02 DIAGNOSIS — I6529 Occlusion and stenosis of unspecified carotid artery: Secondary | ICD-10-CM | POA: Diagnosis present

## 2022-05-02 DIAGNOSIS — I259 Chronic ischemic heart disease, unspecified: Secondary | ICD-10-CM | POA: Diagnosis present

## 2022-05-02 DIAGNOSIS — Z7984 Long term (current) use of oral hypoglycemic drugs: Secondary | ICD-10-CM | POA: Diagnosis not present

## 2022-05-02 DIAGNOSIS — Z79899 Other long term (current) drug therapy: Secondary | ICD-10-CM | POA: Insufficient documentation

## 2022-05-02 DIAGNOSIS — I25118 Atherosclerotic heart disease of native coronary artery with other forms of angina pectoris: Secondary | ICD-10-CM | POA: Diagnosis present

## 2022-05-02 DIAGNOSIS — Z87891 Personal history of nicotine dependence: Secondary | ICD-10-CM | POA: Insufficient documentation

## 2022-05-02 HISTORY — PX: CORONARY STENT INTERVENTION: CATH118234

## 2022-05-02 LAB — GLUCOSE, CAPILLARY
Glucose-Capillary: 146 mg/dL — ABNORMAL HIGH (ref 70–99)
Glucose-Capillary: 155 mg/dL — ABNORMAL HIGH (ref 70–99)
Glucose-Capillary: 291 mg/dL — ABNORMAL HIGH (ref 70–99)
Glucose-Capillary: 182 mg/dL — ABNORMAL HIGH (ref 70–99)

## 2022-05-02 SURGERY — CORONARY STENT INTERVENTION
Anesthesia: Moderate Sedation | Laterality: Left

## 2022-05-02 MED ORDER — SODIUM CHLORIDE 0.9 % IV SOLN
INTRAVENOUS | Status: DC | PRN
  Administered 2022-05-02 (×2): 1.75 mg/kg/h via INTRAVENOUS

## 2022-05-02 MED ORDER — HYDRALAZINE HCL 20 MG/ML IJ SOLN: 10.0000 mg | INTRAMUSCULAR | Status: AC | PRN

## 2022-05-02 MED ORDER — CLOPIDOGREL BISULFATE 75 MG PO TABS
ORAL_TABLET | ORAL | Status: DC | PRN
  Administered 2022-05-02: 300 mg via ORAL

## 2022-05-02 MED ORDER — HYDRALAZINE HCL 20 MG/ML IJ SOLN
INTRAMUSCULAR | Status: AC
  Filled 2022-05-02: qty 1

## 2022-05-02 MED ORDER — NITROGLYCERIN 1 MG/10 ML FOR IR/CATH LAB
INTRA_ARTERIAL | Status: DC | PRN
  Administered 2022-05-02 (×3): 200 ug via INTRACORONARY

## 2022-05-02 MED ORDER — BIVALIRUDIN TRIFLUOROACETATE 250 MG IV SOLR
INTRAVENOUS | Status: AC
  Filled 2022-05-02: qty 250

## 2022-05-02 MED ORDER — ASPIRIN 81 MG PO CHEW
81.0000 mg | CHEWABLE_TABLET | Freq: Once | ORAL | Status: AC
  Administered 2022-05-02: 81 mg via ORAL

## 2022-05-02 MED ORDER — INSULIN ASPART 100 UNIT/ML IJ SOLN: 0.0000 [IU] | Freq: Every day | INTRAMUSCULAR | Status: DC

## 2022-05-02 MED ORDER — HEPARIN (PORCINE) IN NACL 1000-0.9 UT/500ML-% IV SOLN
INTRAVENOUS | Status: AC
  Filled 2022-05-02: qty 500

## 2022-05-02 MED ORDER — BIVALIRUDIN BOLUS VIA INFUSION - CUPID
INTRAVENOUS | Status: DC | PRN
  Administered 2022-05-02: 52.05 mg via INTRAVENOUS

## 2022-05-02 MED ORDER — LABETALOL HCL 5 MG/ML IV SOLN
INTRAVENOUS | Status: AC
  Administered 2022-05-02: 10 mg via INTRAVENOUS
  Filled 2022-05-02: qty 4

## 2022-05-02 MED ORDER — ATORVASTATIN CALCIUM 80 MG PO TABS
80.0000 mg | ORAL_TABLET | Freq: Every day | ORAL | Status: DC
  Administered 2022-05-02 – 2022-05-03 (×2): 80 mg via ORAL
  Filled 2022-05-02 (×2): qty 1

## 2022-05-02 MED ORDER — SODIUM CHLORIDE 0.9 % IV SOLN: INTRAVENOUS | Status: AC

## 2022-05-02 MED ORDER — CLOPIDOGREL BISULFATE 75 MG PO TABS
75.0000 mg | ORAL_TABLET | Freq: Every day | ORAL | Status: DC
  Administered 2022-05-03: 75 mg via ORAL
  Filled 2022-05-02: qty 1

## 2022-05-02 MED ORDER — SODIUM CHLORIDE 0.9 % IV SOLN: 250.0000 mL | INTRAVENOUS | Status: DC | PRN

## 2022-05-02 MED ORDER — LISINOPRIL 10 MG PO TABS
20.0000 mg | ORAL_TABLET | Freq: Every day | ORAL | Status: DC
  Administered 2022-05-02 – 2022-05-03 (×2): 20 mg via ORAL
  Filled 2022-05-02 (×2): qty 2

## 2022-05-02 MED ORDER — SODIUM CHLORIDE 0.9% FLUSH: 3.0000 mL | INTRAVENOUS | Status: DC | PRN

## 2022-05-02 MED ORDER — SERTRALINE HCL 25 MG PO TABS
25.0000 mg | ORAL_TABLET | Freq: Every day | ORAL | Status: DC
  Administered 2022-05-02 – 2022-05-03 (×2): 25 mg via ORAL
  Filled 2022-05-02 (×2): qty 1

## 2022-05-02 MED ORDER — NITROGLYCERIN 1 MG/10 ML FOR IR/CATH LAB
INTRA_ARTERIAL | Status: AC
  Filled 2022-05-02: qty 10

## 2022-05-02 MED ORDER — SODIUM CHLORIDE 0.9 % WEIGHT BASED INFUSION
1.0000 mL/kg/h | INTRAVENOUS | Status: DC
  Administered 2022-05-02: 1 mL/kg/h via INTRAVENOUS

## 2022-05-02 MED ORDER — HYDRALAZINE HCL 20 MG/ML IJ SOLN
INTRAMUSCULAR | Status: DC | PRN
  Administered 2022-05-02: 10 mg via INTRAVENOUS

## 2022-05-02 MED ORDER — PANTOPRAZOLE SODIUM 20 MG PO TBEC
20.0000 mg | DELAYED_RELEASE_TABLET | Freq: Every day | ORAL | Status: DC
  Administered 2022-05-02 – 2022-05-03 (×2): 20 mg via ORAL
  Filled 2022-05-02 (×2): qty 1

## 2022-05-02 MED ORDER — CLOPIDOGREL BISULFATE 75 MG PO TABS
ORAL_TABLET | ORAL | Status: AC
  Filled 2022-05-02: qty 4

## 2022-05-02 MED ORDER — SODIUM CHLORIDE 0.9% FLUSH: 3.0000 mL | Freq: Two times a day (BID) | INTRAVENOUS | Status: DC

## 2022-05-02 MED ORDER — ASPIRIN 81 MG PO CHEW
CHEWABLE_TABLET | ORAL | Status: AC
  Filled 2022-05-02: qty 1

## 2022-05-02 MED ORDER — INSULIN ASPART 100 UNIT/ML IJ SOLN
0.0000 [IU] | Freq: Three times a day (TID) | INTRAMUSCULAR | Status: DC
  Administered 2022-05-02: 2 [IU] via SUBCUTANEOUS
  Administered 2022-05-02: 5 [IU] via SUBCUTANEOUS
  Administered 2022-05-03: 1 [IU] via SUBCUTANEOUS
  Filled 2022-05-02 (×3): qty 1

## 2022-05-02 MED ORDER — IOHEXOL 300 MG/ML  SOLN
INTRAMUSCULAR | Status: DC | PRN
  Administered 2022-05-02: 127 mL

## 2022-05-02 MED ORDER — SODIUM CHLORIDE 0.9% FLUSH
3.0000 mL | Freq: Two times a day (BID) | INTRAVENOUS | Status: DC
  Administered 2022-05-02 – 2022-05-03 (×2): 3 mL via INTRAVENOUS

## 2022-05-02 MED ORDER — ASPIRIN 81 MG PO CHEW
81.0000 mg | CHEWABLE_TABLET | Freq: Every day | ORAL | Status: DC
  Administered 2022-05-03: 81 mg via ORAL
  Filled 2022-05-02: qty 1

## 2022-05-02 MED ORDER — SODIUM CHLORIDE 0.9 % WEIGHT BASED INFUSION
3.0000 mL/kg/h | INTRAVENOUS | Status: DC
  Administered 2022-05-02: 3 mL/kg/h via INTRAVENOUS

## 2022-05-02 MED ORDER — ACETAMINOPHEN 325 MG PO TABS: 650.0000 mg | ORAL_TABLET | ORAL | Status: DC | PRN

## 2022-05-02 MED ORDER — HEPARIN (PORCINE) IN NACL 1000-0.9 UT/500ML-% IV SOLN
INTRAVENOUS | Status: DC | PRN
  Administered 2022-05-02 (×3): 500 mL

## 2022-05-02 MED ORDER — LABETALOL HCL 5 MG/ML IV SOLN
10.0000 mg | INTRAVENOUS | Status: AC | PRN
  Administered 2022-05-02: 10 mg via INTRAVENOUS

## 2022-05-02 MED ORDER — VITAMIN B-12 1000 MCG PO TABS
1000.0000 ug | ORAL_TABLET | Freq: Every day | ORAL | Status: DC
  Administered 2022-05-02 – 2022-05-03 (×2): 1000 ug via ORAL
  Filled 2022-05-02 (×2): qty 1

## 2022-05-02 MED ORDER — ONDANSETRON HCL 4 MG/2ML IJ SOLN: 4.0000 mg | Freq: Four times a day (QID) | INTRAMUSCULAR | Status: DC | PRN

## 2022-05-02 MED ORDER — METOPROLOL TARTRATE 25 MG PO TABS
25.0000 mg | ORAL_TABLET | Freq: Two times a day (BID) | ORAL | Status: DC
  Administered 2022-05-02 – 2022-05-03 (×3): 25 mg via ORAL
  Filled 2022-05-02 (×3): qty 1

## 2022-05-02 SURGICAL SUPPLY — 33 items
BALLN TREK RX 2.25X12 (BALLOONS) ×1 IMPLANT
BALLN TREK RX 2.5X12 (BALLOONS) ×2 IMPLANT
BALLN ~~LOC~~ EUPHORA RX 3.25X15 (BALLOONS) ×1 IMPLANT
BALLN ~~LOC~~ TREK NEO RX 2.5X12 (BALLOONS) ×1 IMPLANT
BALLN ~~LOC~~ TREK NEO RX 3.0X12 (BALLOONS) ×1 IMPLANT
BALLN ~~LOC~~ TREK NEO RX 3.0X8 (BALLOONS) IMPLANT
BALLN ~~LOC~~ TREK NEO RX 3.25X12 (BALLOONS) IMPLANT
BALLN ~~LOC~~ TREK NEO RX 3.5X8 (BALLOONS) ×1 IMPLANT
BALLOON TREK RX 2.25X12 (BALLOONS) IMPLANT
BALLOON TREK RX 2.5X12 (BALLOONS) IMPLANT
BALLOON ~~LOC~~ EUPHORA RX 3.25X15 (BALLOONS) IMPLANT
BALLOON ~~LOC~~ TREK NEO RX 2.5X12 (BALLOONS) IMPLANT
BALLOON ~~LOC~~ TREK NEO RX 3.0X12 (BALLOONS) IMPLANT
BALLOON ~~LOC~~ TREK NEO RX 3.5X8 (BALLOONS) IMPLANT
CANNULA 5F STIFF (CANNULA) IMPLANT
CATH DRAGONFLY OPSTAR (CATHETERS) IMPLANT
CATH INFINITI JR4 5F (CATHETERS) IMPLANT
CATH LAUNCHER 6FR EBU 3.75 (CATHETERS) IMPLANT
COVER EZ STRL 42X30 (DRAPES) IMPLANT
DEVICE CLOSURE MYNXGRIP 6/7F (Vascular Products) IMPLANT
KIT ENCORE 26 ADVANTAGE (KITS) IMPLANT
PACK CARDIAC CATH (CUSTOM PROCEDURE TRAY) ×1 IMPLANT
PROTECTION STATION PRESSURIZED (MISCELLANEOUS) ×1 IMPLANT
SET ATX SIMPLICITY (MISCELLANEOUS) IMPLANT
SHEATH AVANTI 6FR X 11CM (SHEATH) IMPLANT
STATION PROTECTION PRESSURIZED (MISCELLANEOUS) IMPLANT
STENT ONYX FRONTIER 2.5X15 (Permanent Stent) IMPLANT
STENT ONYX FRONTIER 2.75X30 (Permanent Stent) IMPLANT
TUBING CIL FLEX 10 FLL-RA (TUBING) IMPLANT
WIRE ASAHI GRAND SLAM 180CM (WIRE) IMPLANT
WIRE G HI TQ BMW 190 (WIRE) IMPLANT
WIRE GUIDERIGHT .035X150 (WIRE) IMPLANT
WIRE RUNTHROUGH .014X180CM (WIRE) IMPLANT

## 2022-05-02 NOTE — Brief Op Note (Signed)
BRIEF CARDIAC CATHETERIZATION NOTE  05/02/2022  11:16 AM  PATIENT:  Raymond Sampson  82 y.o. male  PRE-OPERATIVE DIAGNOSIS:  NSTEMI with staged PCI to LCx  POST-OPERATIVE DIAGNOSIS:  Same  PROCEDURE:  Procedure(s): CORONARY STENT INTERVENTION (Left)  SURGEON:  Surgeon(s) and Role:    * Beldon Nowling, MD - Primary  FINDINGS: Patent LAD stents with stable mild ISR of remotely placed mid-distal LAD stents, unchanged from completion of last catheterization. Severe LCx/OM2 disease, similar to prior catheterizations. Mildly elevated left ventricular filling pressure. Successful bifurcation PCI to LCx/OM2 using Culotte technique with 0% residual stenosis and TIMI-3 flow.  RECOMMENDATIONS: Continue DAPT with aspirin and clopidogrel for at least 12 months. Aggressive secondary prevention. Overnight observation.  Nelva Bush, MD [facility]

## 2022-05-02 NOTE — Interval H&P Note (Signed)
History and Physical Interval Note:  05/02/2022 7:56 AM  Raymond Sampson  has presented today for surgery, with the diagnosis of NSTEMI.  The various methods of treatment have been discussed with the patient and family. After consideration of risks, benefits and other options for treatment, the patient has consented to  Procedure(s): CORONARY STENT INTERVENTION (Left) as a surgical intervention.  The patient's history has been reviewed, patient examined, no change in status, stable for surgery.  I have reviewed the patient's chart and labs.  Questions were answered to the patient's satisfaction.     Cath Lab Visit (complete for each Cath Lab visit)  Clinical Evaluation Leading to the Procedure:   ACS: Yes.    Non-ACS:  N/A  Hannahgrace Lalli

## 2022-05-03 ENCOUNTER — Telehealth: Payer: Self-pay | Admitting: *Deleted

## 2022-05-03 DIAGNOSIS — D649 Anemia, unspecified: Secondary | ICD-10-CM

## 2022-05-03 DIAGNOSIS — Z955 Presence of coronary angioplasty implant and graft: Secondary | ICD-10-CM

## 2022-05-03 DIAGNOSIS — I214 Non-ST elevation (NSTEMI) myocardial infarction: Secondary | ICD-10-CM | POA: Diagnosis not present

## 2022-05-03 DIAGNOSIS — I2510 Atherosclerotic heart disease of native coronary artery without angina pectoris: Secondary | ICD-10-CM | POA: Diagnosis not present

## 2022-05-03 LAB — POCT ACTIVATED CLOTTING TIME: Activated Clotting Time: 379 seconds

## 2022-05-03 LAB — CBC
HCT: 32.9 % — ABNORMAL LOW (ref 39.0–52.0)
Hemoglobin: 10.9 g/dL — ABNORMAL LOW (ref 13.0–17.0)
MCH: 29.9 pg (ref 26.0–34.0)
MCHC: 33.1 g/dL (ref 30.0–36.0)
MCV: 90.1 fL (ref 80.0–100.0)
Platelets: 301 10*3/uL (ref 150–400)
RBC: 3.65 MIL/uL — ABNORMAL LOW (ref 4.22–5.81)
RDW: 13.2 % (ref 11.5–15.5)
WBC: 10.9 10*3/uL — ABNORMAL HIGH (ref 4.0–10.5)
nRBC: 0 % (ref 0.0–0.2)

## 2022-05-03 LAB — BASIC METABOLIC PANEL
Anion gap: 7 (ref 5–15)
BUN: 15 mg/dL (ref 8–23)
CO2: 25 mmol/L (ref 22–32)
Calcium: 8.8 mg/dL — ABNORMAL LOW (ref 8.9–10.3)
Chloride: 105 mmol/L (ref 98–111)
Creatinine, Ser: 0.97 mg/dL (ref 0.61–1.24)
GFR, Estimated: >60 mL/min (ref >60)
Glucose, Bld: 137 mg/dL — ABNORMAL HIGH (ref 70–99)
Potassium: 3.7 mmol/L (ref 3.5–5.1)
Sodium: 137 mmol/L (ref 135–145)

## 2022-05-03 LAB — GLUCOSE, CAPILLARY: Glucose-Capillary: 145 mg/dL — ABNORMAL HIGH (ref 70–99)

## 2022-05-03 MED ORDER — METFORMIN HCL 1000 MG PO TABS: 1000.0000 mg | ORAL_TABLET | Freq: Two times a day (BID) | ORAL | Status: DC

## 2022-05-03 NOTE — Telephone Encounter (Signed)
Called and spoke with spouse and she confirmed labs needed and date to have them done. She verbalized understanding with no further questions at this time.

## 2022-05-03 NOTE — TOC Initial Note (Signed)
Transition of Care [facility]) - Initial/Assessment Note    Patient Details  Name: Raymond Sampson MRN: [id number] Date of Birth: [birth year]  Transition of Care [facility]) CM/SW Contact:    Laurena Slimmer, RN Phone Number: 05/03/2022, 10:03 AM  Clinical Narrative:                  Transition of Care [facility]) Screening Note   Patient Details  Name: Raymond Sampson Date of Birth: 07-26-1940   Transition of Care [facility]) CM/SW Contact:    Laurena Slimmer, RN Phone Number: 05/03/2022, 10:03 AM    Transition of Care Department (TOC) has reviewed patient and no TOC needs have been identified at this time. We will continue to monitor patient advancement through interdisciplinary progression rounds. If new patient transition needs arise, please place a TOC consult.          Patient Goals and CMS Choice            Expected Discharge Plan and Services         Expected Discharge Date: 05/03/22                                    Prior Living Arrangements/Services                       Activities of Daily Living Home Assistive Devices/Equipment: Blood pressure cuff ADL Screening (condition at time of admission) Patient's cognitive ability adequate to safely complete daily activities?: Yes Is the patient deaf or have difficulty hearing?: No Does the patient have difficulty seeing, even when wearing glasses/contacts?: No Does the patient have difficulty concentrating, remembering, or making decisions?: No Patient able to express need for assistance with ADLs?: Yes Does the patient have difficulty dressing or bathing?: No Independently performs ADLs?: Yes (appropriate for developmental age) Does the patient have difficulty walking or climbing stairs?: No Weakness of Legs: None Weakness of Arms/Hands: None  Permission Sought/Granted                  Emotional Assessment              Admission diagnosis:  Status post coronary artery stent placement  [Z95.5] Patient Active Problem List   Diagnosis Date Noted   Status post coronary artery stent placement 05/02/2022   Mild dementia ([place]) 04/14/2022   Non-ST elevation (NSTEMI) myocardial infarction ([place]) 04/13/2022   History of COVID-19 04/13/2022   Lung nodule 04/13/2022   PAD (peripheral artery disease) ([place]) 04/13/2022   Flu 06/05/2017   Diabetes mellitus type 2, controlled, with complications ([place]) 73/41/9379   Carotid stenosis 05/14/2013   CAROTID BRUIT, LEFT 04/25/2010   PALPITATIONS 06/18/2009   Hyperlipidemia 04/27/2009   Essential hypertension 04/27/2009   CAD, NATIVE VESSEL 04/27/2009   PCP:  [name], MD Pharmacy:   [id number] [place] [address] [place]Phone: [phone]: [phone]    Social Determinants of Health (SDOH) Social History: SDOH Screenings   Food Insecurity: No Food Insecurity (04/13/2022)  Housing: Low Risk  (04/13/2022)  Transportation Needs: No Transportation Needs (04/13/2022)  Utilities: Not At Risk (04/13/2022)  Tobacco Use: Medium Risk (05/02/2022)   SDOH Interventions:     Readmission Risk Interventions     No data to display

## 2022-05-03 NOTE — Discharge Summary (Addendum)
Discharge Summary    Patient ID: Raymond Sampson  MRN: [id number], DOB/AGE: 82-Aug-1942 82 y.o.  Admit Date: 05/02/2022 Discharge Date: 05/03/2022  Primary Care Provider: [name], MD Primary Cardiologist: Dr. Rockey Situ, MD  Discharge Diagnoses    Principal Problem:   Status post coronary artery stent placement Active Problems:   CAD, NATIVE VESSEL   Hyperlipidemia   Essential hypertension   Carotid stenosis   Non-ST elevation (NSTEMI) myocardial infarction [facility])   Allergies Allergies  Allergen Reactions   Metformin And Related Itching    Erectile dysfunction   Simvastatin Rash    rash     History of Present Illness     82 year old male with history of CAD with apical MI in 2007 status post stenting x 3 to the LAD with NSTEMI in 04/2022 status post PCI and DES to the LAD and RCA as outlined below, TIA/CVA in 2012, atrial tachycardia, bilateral carotid artery disease, dementia, DM2, HTN, HLD, and prior tobacco use who presented to [facility] on 05/02/2022 for staged PCI of the LCx.  He was admitted  to the hospital in 08/2005 with an MI that was treated with PCI/DES x 3 to the LAD. Stress test in 2013 showed no significant ischemia, EF 68%, with severely elevated BP. Echo in 2014 showed an EF of 03-54%, diastolic dysfunction, normal RVSF and cavity size, normal RVSP. He was seen in early 2018 with palpitations and abnormal EKG leading to a Myoview in 04/2016 that was low risk. Outpatient cardiac monitoring in 05/2016 showed NSR with an average heart rate of 69 bpm (40-171 bpm), 7 atrial tachycardic runs occurred with the longest lasting 7 beats with an average rate of 105 bpm with no evidence of Afib.  Most recent carotid artery ultrasound from 12/2018 showed 1 to 39% bilateral ICA stenosis with bilateral vertebral arteries demonstrating antegrade flow and normal flow hemodynamics in the bilateral subclavian arteries.    He was admitted to [facility] from 1/10 through 04/15/2022 with an  NSTEMI.  High-sensitivity troponin peaked at 1422.  CTA of the chest showed no evidence of PE with an incidental nodular density noted in the left lung base as well as mild right hilar lymphadenopathy, right adrenal adenoma, and aortic atherosclerosis.  Echo showed an EF of 55 to 60%, mild LVH, hypokinesis of the basal septal region, grade 1 diastolic dysfunction, normal RV systolic function and ventricular cavity size, normal PASP, aortic valve sclerosis without evidence of stenosis, and normal estimated right atrial pressure.  LHC on 04/13/2022 showed severe three-vessel CAD as outlined below.  After further discussion, he was not felt to be a CABG candidate given advanced age and dementia with recommendation to proceed with complex PCI intervention.  He subsequently underwent repeat LHC on 04/14/2022 that again showed severe three-vessel CAD.  He underwent successful PCI/DES to the proximal LAD, PCI/DES to the proximal RCA, and PCI/DES to in-stent restenosis involving the long segment of mid/distal LAD stenting.  The entire stent was angioplastied and Onyx Frontier 2.0 x 18 mm drug-eluting stent was placed at the distal edge leading to reduction of stenosis from 70% to 20 to 30% in the proximal and mid portions with 0% residual stenosis at the distal edge and TIMI-3 flow throughout the vessel.  Following these interventions, he remained chest pain-free with family preferring the patient be discharged with planned staged PCI to the LCx be pursued in the outpatient setting.   He was seen in hospital follow-up on 04/26/2022 and  was without symptoms of angina or decompensation.  Patient and wife remained interested in pursuing staged PCI of the LCx.   Hospital Course     Consultants: Cardiac rehab   He presented to [facility] on 05/02/2022 for planned, staged PCI of the LCx.  He underwent successful PCI/DES to the LCx with procedure notable for hematoma improved with pressure.  Post cath BP mildly elevated. Post-cath  labs showed a slight down trend in his hgb.  Renal function stable.  He has remained without symptoms of angina or decompensation.      Multivessel CAD involving the native coronary arteries without angina:  -Status post recent complex PCI x 2 to the LAD and RCA earlier this month in the setting of NSTEMI, now status post staged PCI/DES to the LCx  -He is without symptoms of angina or decompensation  -Continue DAPT without interruption with ASA and clopidogrel  -Continue aggressive risk factor modification and secondary prevention including atorvastatin, lisinopril, and metoprolol  -Cardiac rehab   HTN:  -Blood pressure overall stable -Continue Lopressor and lisinopril   HLD:  -LDL 114 in 04/2022 with goal being less than 55  -Started on atorvastatin 80 mg daily earlier this month  -Follow-up fasting lipid panel and LFT in approximately 2 months with recommendation to escalate lipid therapy as indicated to achieve target LDL   Atrial tachycardia:  -Quiescent on metoprolol   Bilateral carotid artery disease:  -1 to 39% bilateral ICA stenosis on last carotid artery ultrasound.  DAPT as outlined above with continuation of atorvastatin.  -Carotid artery ultrasound was previously scheduled in the outpatient setting  -Follow-up as outpatient   Bilateral claudication:  -Scheduled for ABIs and lower extremity arterial ultrasound in the outpatient setting  -He remains on DAPT as statin as outlined above  -Given his advanced age, frail state, and sedentary lifestyle, some of this may be related to deconditioning   History of TIA/CVA:  -No new symptoms  -Aspirin, clopidogrel, atorvastatin as outlined above   History of pulmonary nodule:  -Patient was advised to follow-up with PCP  Anemia: -Will plan for a follow up Hgb/Hct in 48 hours to ensure stability   DM: -Resume metformin 48 hours post cath    The patient's left femoral cath site has been examined is healing well without  issues at this time. The patient has been seen by Dr. Haroldine Laws and felt to be stable for discharge today. All follow up appointments have been made. Discharge medications are listed below. Prescriptions have been reviewed with the patient and sent in to their pharmacy as needed.  _____________  Discharge Vitals Blood pressure (!) 179/64, pulse 77, temperature 98.2 F (36.8 C), resp. rate 16, height '5\' 7"'$  (1.702 m), weight 69.4 kg, SpO2 94 %.  Filed Weights   05/02/22 0653  Weight: 69.4 kg   Physical Exam   GEN: No acute distress.   Neck: No JVD.  Cardiac: RRR, no murmurs, rubs, or gallops. Left femoral arteriotomy site without active bleeding, swelling, bruising, progressive hematoma, warmth, erythema, or TTP.  No bruit.  Respiratory: Clear to auscultation bilaterally.  GI: Soft, nontender, non-distended.   MS: No edema; No deformity.  Neuro:  Alert and oriented x 3; Nonfocal.  Psych: Normal affect.    Labs & Radiologic Studies    CBC Recent Labs    05/03/22 0150  WBC 10.9*  HGB 10.9*  HCT 32.9*  MCV 90.1  PLT 774   Basic Metabolic Panel Recent Labs    05/03/22  0150  NA 137  K 3.7  CL 105  CO2 25  GLUCOSE 137*  BUN 15  CREATININE 0.97  CALCIUM 8.8*   Liver Function Tests No results for input(s): "AST", "ALT", "ALKPHOS", "BILITOT", "PROT", "ALBUMIN" in the last 72 hours. No results for input(s): "LIPASE", "AMYLASE" in the last 72 hours. Cardiac Enzymes No results for input(s): "CKTOTAL", "CKMB", "CKMBINDEX", "TROPONINI" in the last 72 hours. BNP Invalid input(s): "POCBNP" D-Dimer No results for input(s): "DDIMER" in the last 72 hours. Hemoglobin A1C No results for input(s): "HGBA1C" in the last 72 hours. Fasting Lipid Panel No results for input(s): "CHOL", "HDL", "LDLCALC", "TRIG", "CHOLHDL", "LDLDIRECT" in the last 72 hours. Thyroid Function Tests No results for input(s): "TSH", "T4TOTAL", "T3FREE", "THYROIDAB" in the last 72 hours.  Invalid input(s):  "FREET3" _____________    Diagnostic Studies/Procedures   LHC 05/02/2022: Conclusions: Patent LAD stents with stable mild in-stent restenosis of remotely placed mid-distal LAD stents, unchanged from completion of last catheterization.  Recently placed proximal LAD stent is widely patent. Severe LCx/OM2 disease, similar to prior catheterizations. Mildly elevated left ventricular filling pressure. Successful complex bifurcation PCI to LCx/OM2 using Culotte technique (Onyx frontier 2.75 x 30 mm main branch and 2.5 x 15 mm side branch drug-eluting stents) with 0% residual stenosis and TIMI-3 flow.   Recommendations: Continue dual antiplatelet therapy with aspirin and clopidogrel for at least 12 months. Aggressive secondary prevention. Overnight observation. _____________  Disposition   Pt is being discharged home today in good condition.  Follow-up Plans & Appointments     Follow-up Information     Rise Mu, PA-C Follow up on 05/10/2022.   Specialties: Cardiology, Radiology Why: Appointment time 1:30 PM, please arrive 20 minutes early Contact information: [place] [phone]                Discharge Instructions     AMB Referral to Cardiac Rehabilitation - Phase II   Complete by: As directed    Diagnosis: Coronary Stents   After initial evaluation and assessments completed: Virtual Based Care may be provided alone or in conjunction with Phase 2 Cardiac Rehab based on patient barriers.: Yes   Intensive Cardiac Rehabilitation (ICR) [place] location only OR Traditional Cardiac Rehabilitation (TCR) *If criteria for ICR are not met will enroll in TCR [facility] only): Yes   Diet - low sodium heart healthy   Complete by: As directed    Increase activity slowly   Complete by: As directed        Discharge Medications   Allergies as of 05/03/2022       Reactions   Metformin And Related Itching   Erectile dysfunction   Simvastatin  Rash   rash        Medication List     TAKE these medications    aspirin 81 MG tablet Take 81 mg by mouth daily.   atorvastatin 80 MG tablet Commonly known as: LIPITOR Take 1 tablet (80 mg total) by mouth daily.   clopidogrel 75 MG tablet Commonly known as: PLAVIX TAKE 1 TABLET BY MOUTH DAILY WITH BREAKFAST.   cyanocobalamin 1000 MCG tablet Take 1,000 mcg by mouth daily.   lisinopril 20 MG tablet Commonly known as: ZESTRIL TAKE 1 TABLET BY MOUTH EVERY DAY   metFORMIN 1000 MG tablet Commonly known as: GLUCOPHAGE Take 1 tablet (1,000 mg total) by mouth 2 (two) times daily with a meal. Resume 05/05/2022 What changed: additional instructions   metoprolol  tartrate 25 MG tablet Commonly known as: LOPRESSOR TAKE 1 TABLET BY MOUTH TWICE A DAY   pantoprazole 20 MG tablet Commonly known as: PROTONIX Take 1 tablet (20 mg total) by mouth daily.   sertraline 25 MG tablet Commonly known as: ZOLOFT Take 1 tablet (25 mg total) by mouth daily.   sitaGLIPtin 25 MG tablet Commonly known as: JANUVIA Take 1 tablet by mouth daily.         Aspirin prescribed at discharge?  Yes High Intensity Statin Prescribed? (Lipitor 40-'80mg'$  or Crestor 20-'40mg'$ ): Yes Beta Blocker Prescribed? Yes For EF <40%, was ACEI/ARB Prescribed? No: EF > 40% (on ACEi) ADP Receptor Inhibitor Prescribed? (i.e. Plavix etc.-Includes Medically Managed Patients): Yes For EF <40%, Aldosterone Inhibitor Prescribed? No: EF > 40% Was EF assessed during THIS hospitalization? No: assessed during echo earlier this month Was Cardiac Rehab II ordered? (Included Medically managed Patients): Yes   Outstanding Labs/Studies   None.  Duration of Discharge Encounter   Greater than 30 minutes including physician time.  Signed, Rise Mu, PA-C [place] Pager: [phone] 05/03/2022, 7:59 AM  Patient seen and examined with the above-signed Advanced Practice Provider and/or Housestaff. I personally reviewed  laboratory data, imaging studies and relevant notes. I independently examined the patient and formulated the important aspects of the plan. I have edited the note to reflect any of my changes or salient points. I have personally discussed the plan with the patient and/or family.  Denies CP or SOB. Groin site ok   General:  Elderly No resp difficulty HEENT: normal Neck: supple. no JVD. Carotids 2+ bilat; no bruits. No lymphadenopathy or thryomegaly appreciated. Cor: PMI nondisplaced. Regular rate & rhythm. No rubs, gallops or murmurs. Lungs: clear Abdomen: soft, nontender, nondistended. No hepatosplenomegaly. No bruits or masses. Good bowel sounds. Extremities: no cyanosis, clubbing, rash, edema Neuro: alert & orientedx3, cranial nerves grossly intact. moves all 4 extremities w/o difficulty. Affect pleasant  Ok for d/c today on above meds. Has f/u arranged and CR planned. All questions answered.   Glori Bickers, MD  10:01 AM

## 2022-05-03 NOTE — Plan of Care (Signed)
  Problem: Education: Goal: Understanding of cardiac disease, CV risk reduction, and recovery process will improve Outcome: Progressing Goal: Individualized Educational Video(s) Outcome: Progressing   Problem: Activity: Goal: Ability to tolerate increased activity will improve Outcome: Progressing   Problem: Cardiac: Goal: Ability to achieve and maintain adequate cardiovascular perfusion will improve Outcome: Progressing   Problem: Health Behavior/Discharge Planning: Goal: Ability to safely manage health-related needs after discharge will improve Outcome: Progressing   Problem: Education: Goal: Understanding of CV disease, CV risk reduction, and recovery process will improve Outcome: Progressing Goal: Individualized Educational Video(s) Outcome: Progressing   Problem: Activity: Goal: Ability to return to baseline activity level will improve Outcome: Progressing   Problem: Cardiovascular: Goal: Ability to achieve and maintain adequate cardiovascular perfusion will improve Outcome: Progressing Goal: Vascular access site(s) Level 0-1 will be maintained Outcome: Progressing   Problem: Health Behavior/Discharge Planning: Goal: Ability to safely manage health-related needs after discharge will improve Outcome: Progressing   Problem: Education: Goal: Knowledge of General Education information will improve Description: Including pain rating scale, medication(s)/side effects and non-pharmacologic comfort measures Outcome: Progressing   Problem: Health Behavior/Discharge Planning: Goal: Ability to manage health-related needs will improve Outcome: Progressing   Problem: Clinical Measurements: Goal: Ability to maintain clinical measurements within normal limits will improve Outcome: Progressing Goal: Will remain free from infection Outcome: Progressing Goal: Diagnostic test results will improve Outcome: Progressing Goal: Respiratory complications will improve Outcome:  Progressing Goal: Cardiovascular complication will be avoided Outcome: Progressing   Problem: Activity: Goal: Risk for activity intolerance will decrease Outcome: Progressing   Problem: Nutrition: Goal: Adequate nutrition will be maintained Outcome: Progressing   Problem: Coping: Goal: Level of anxiety will decrease Outcome: Progressing   Problem: Elimination: Goal: Will not experience complications related to bowel motility Outcome: Progressing Goal: Will not experience complications related to urinary retention Outcome: Progressing   Problem: Pain Managment: Goal: General experience of comfort will improve Outcome: Progressing   Problem: Safety: Goal: Ability to remain free from injury will improve Outcome: Progressing   Problem: Skin Integrity: Goal: Risk for impaired skin integrity will decrease Outcome: Progressing   Problem: Education: Goal: Ability to describe self-care measures that may prevent or decrease complications (Diabetes Survival Skills Education) will improve Outcome: Progressing Goal: Individualized Educational Video(s) Outcome: Progressing   Problem: Coping: Goal: Ability to adjust to condition or change in health will improve Outcome: Progressing   Problem: Fluid Volume: Goal: Ability to maintain a balanced intake and output will improve Outcome: Progressing   Problem: Health Behavior/Discharge Planning: Goal: Ability to identify and utilize available resources and services will improve Outcome: Progressing Goal: Ability to manage health-related needs will improve Outcome: Progressing   Problem: Metabolic: Goal: Ability to maintain appropriate glucose levels will improve Outcome: Progressing   Problem: Nutritional: Goal: Maintenance of adequate nutrition will improve Outcome: Progressing Goal: Progress toward achieving an optimal weight will improve Outcome: Progressing   Problem: Skin Integrity: Goal: Risk for impaired skin  integrity will decrease Outcome: Progressing   Problem: Tissue Perfusion: Goal: Adequacy of tissue perfusion will improve Outcome: Progressing

## 2022-05-03 NOTE — Telephone Encounter (Signed)
-----  Message from Rise Mu, PA-C sent at 05/03/2022  8:01 AM EST ----- Please place a future order for Hgb/Hct to be drawn on 05/05/2022. Dx: anemia. Thanks!

## 2022-05-05 ENCOUNTER — Other Ambulatory Visit
Admission: RE | Admit: 2022-05-05 | Discharge: 2022-05-05 | Disposition: A | Discharge status: Home / Self Care | Payer: PPO | Source: Ambulatory Visit | Attending: Physician Assistant | Admitting: Physician Assistant

## 2022-05-05 DIAGNOSIS — D649 Anemia, unspecified: Secondary | ICD-10-CM | POA: Diagnosis present

## 2022-05-05 LAB — HEMOGLOBIN AND HEMATOCRIT, BLOOD
HCT: 33.7 % — ABNORMAL LOW (ref 39.0–52.0)
Hemoglobin: 10.9 g/dL — ABNORMAL LOW (ref 13.0–17.0)

## 2022-05-06 NOTE — Progress Notes (Unsigned)
Cardiology Office Note    Date:  05/10/2022   ID:  Raymond Sampson, DOB 23-Oct-1940, MRN [id number]  PCP:  [name], MD  Cardiologist:  Ida Rogue, MD  Electrophysiologist:  None   Chief Complaint: LHC follow up  History of Present Illness:   Raymond Sampson is a 82 y.o. male with history of CAD with apical MI in 2007 status post stenting x 3 to the LAD with NSTEMI in 04/2022 status post PCI and DES to the LAD and RCA with staged PCI to the LCx, TIA/CVA in 2012, atrial tachycardia, bilateral carotid artery disease, dementia, DM2, HTN, HLD, and prior tobacco use who presents for follow-up of staged PCI to the LCx.   He was admitted  to the hospital in 08/2005 with an MI that was treated with PCI/DES x 3 to the LAD. Stress test in 2013 showed no significant ischemia, EF 68%, with severely elevated BP. Echo in 2014 showed an EF of 48-18%, diastolic dysfunction, normal RVSF and cavity size, normal RVSP. He was seen in early 2018 with palpitations and abnormal EKG leading to a Myoview in 04/2016 that was low risk. Outpatient cardiac monitoring in 05/2016 showed NSR with an average heart rate of 69 bpm (40-171 bpm), 7 atrial tachycardic runs occurred with the longest lasting 7 beats with an average rate of 105 bpm with no evidence of Afib.  Most recent carotid artery ultrasound from 12/2018 showed 1 to 39% bilateral ICA stenosis with bilateral vertebral arteries demonstrating antegrade flow and normal flow hemodynamics in the bilateral subclavian arteries.   He was admitted to [facility] from 1/10 through 04/15/2022 with an NSTEMI.  High-sensitivity troponin peaked at 1422.  CTA of the chest showed no evidence of PE with an incidental nodular density noted in the left lung base as well as mild right hilar lymphadenopathy, right adrenal adenoma, and aortic atherosclerosis.  Echo showed an EF of 55 to 60%, mild LVH, hypokinesis of the basal septal region, grade 1 diastolic dysfunction, normal RV systolic  function and ventricular cavity size, normal PASP, aortic valve sclerosis without evidence of stenosis, and normal estimated right atrial pressure.  LHC on 04/13/2022 showed severe three-vessel CAD as outlined below.  After further discussion, he was not felt to be a CABG candidate given advanced age and dementia with recommendation to proceed with complex PCI intervention.  He subsequently underwent repeat LHC on 04/14/2022 that again showed severe three-vessel CAD.  He underwent successful PCI/DES to the proximal LAD, PCI/DES to the proximal RCA, and PCI/DES to in-stent restenosis involving the long segment of mid/distal LAD stenting.  The entire stent was angioplastied and Onyx Frontier 2.0 x 18 mm drug-eluting stent was placed at the distal edge leading to reduction of stenosis from 70% to 20 to 30% in the proximal and mid portions with 0% residual stenosis at the distal edge and TIMI-3 flow throughout the vessel.  Following these interventions, he remained chest pain-free with family preferring the patient be discharged with planned staged PCI to the LCx be pursued in the outpatient setting.  He was seen in hospital follow-up on 04/26/2022 and was without symptoms of angina or cardiac decompensation.  He was adherent to cardiac medications.  No falls or hematochezia/melena.  His wife did report a 2-day history of dark urine following his hospital discharge that subsequently resolved without intervention and had not recurred.  Patient and wife remain interested in moving forward with staged PCI of the LCx.  He underwent  staged PCI of the LCx on 05/02/2022 with procedure notable for a small hematoma that remained stable and did not require treatment beyond manual compression.  Postprocedural hemoglobin was mildly low, though stable on repeat at 10.9.  He comes accompanied by his wife today and is doing well from a cardiac perspective.  No symptoms of angina or cardiac decompensation.  No dyspnea, palpitations,  dizziness, presyncope, or syncope.  No falls, hematochezia, melena, or hematuria.  They did note some significant bruising along the left femoral arteriotomy site that is resolving.  No active bleeding or swelling.  He remains adherent to DAPT and has not missed any doses.  He will be enrolling in cardiac rehab next week.   Labs independently reviewed: 04/2022 - potassium 3.7, BUN 15, serum creatinine 0.97, magnesium 1.9, Hgb 10.9, PLT 301, LP(a) 23, TC 205, TG 257, HDL 40, LDL 114, A1c 6.8, albumin 4.0, AST/ALT normal 05/2019 - TSH normal  Past Medical History:  Diagnosis Date   Arthritic-like pain    CAD (coronary artery disease)    a. apical MI 5/07 with LHC showing 50% pCFX, 30% pRCA, 99% pLAD, 99% mLAD, 75% dLAD (cypher DES x3 to LAD); b. ETT-myoview (12/08) 81% MPHR,  EF 64%, partially reversible inferoapical perfusion defect similar to prior study 12/07; c. 04/2016 MV: EF 59%, fixed anteroapical defect, no ischemia->Low risk.   Carotid arterial disease ([place])    a. 05/2016 Carotid U/S: 30-40% bilat dzs, f/u in 2 yrs.   Chest pain 04/13/2022   CVA (cerebral vascular accident) [facility])    a. 07/2010 - h/o CVA/TIA.   Diet-controlled type 2 diabetes mellitus (HCC)    HLD (hyperlipidemia)    HTN (hypertension)    PAT (paroxysmal atrial tachycardia)    a. 06/2016 Event monitor:  rare episodes of atrial tachycardia, longest 7 beats. No afib.    Past Surgical History:  Procedure Laterality Date   BACK SURGERY     CARDIAC CATHETERIZATION     CORONARY ANGIOPLASTY WITH STENT PLACEMENT     apical MI 5/07 with LHC showing 50% pCFX, 30% pRCA, 99% pLAD, 9%% mLAD, 75% dLAD. cypher DES x3 to LAD. ETT-myoview (12/08) 81% MPHR, 8'1, EF 64%, partially reversible inferoapical perfusion defect similar to prior study 12/07   CORONARY STENT INTERVENTION N/A 04/14/2022   Procedure: CORONARY STENT INTERVENTION;  Surgeon: Nelva Bush, MD;  Location: [place] [facility];  Service: Cardiovascular;  Laterality:  N/A;   CORONARY STENT INTERVENTION Left 05/02/2022   Procedure: CORONARY STENT INTERVENTION;  Surgeon: Nelva Bush, MD;  Location: [place] [facility];  Service: Cardiovascular;  Laterality: Left;   INTRAVASCULAR IMAGING/OCT N/A 04/14/2022   Procedure: INTRAVASCULAR IMAGING/OCT;  Surgeon: Nelva Bush, MD;  Location: [place] [facility];  Service: Cardiovascular;  Laterality: N/A;   LEFT HEART CATH AND CORONARY ANGIOGRAPHY N/A 04/13/2022   Procedure: LEFT HEART CATH AND CORONARY ANGIOGRAPHY;  Surgeon: [name], MD;  Location: [place] [facility];  Service: Cardiovascular;  Laterality: N/A;    Current Medications: Current Meds  Medication Sig   aspirin 81 MG tablet Take 81 mg by mouth daily.   atorvastatin (LIPITOR) 80 MG tablet Take 1 tablet (80 mg total) by mouth daily.   clopidogrel (PLAVIX) 75 MG tablet TAKE 1 TABLET BY MOUTH DAILY WITH BREAKFAST.   cyanocobalamin 1000 MCG tablet Take 1,000 mcg by mouth daily.   lisinopril (ZESTRIL) 20 MG tablet TAKE 1 TABLET BY MOUTH EVERY DAY   metFORMIN (GLUCOPHAGE) 1000 MG tablet Take  1 tablet (1,000 mg total) by mouth 2 (two) times daily with a meal. Resume 05/05/2022   metoprolol tartrate (LOPRESSOR) 25 MG tablet TAKE 1 TABLET BY MOUTH TWICE A DAY   pantoprazole (PROTONIX) 20 MG tablet Take 1 tablet (20 mg total) by mouth daily.   sertraline (ZOLOFT) 25 MG tablet Take 1 tablet (25 mg total) by mouth daily.   sitaGLIPtin (JANUVIA) 25 MG tablet Take 1 tablet by mouth daily.    Allergies:   Metformin and related and Simvastatin   Social History   Socioeconomic History   Marital status: Married    Spouse name: Not on file   Number of children: Not on file   Years of education: Not on file   Highest education level: Not on file  Occupational History   Occupation: retired  Tobacco Use   Smoking status: Former    Packs/day: 1.00    Years: 25.00    Total pack years: 25.00    Types: Cigarettes    Quit date: 04/17/1985    Years  since quitting: 37.0   Smokeless tobacco: Never  Vaping Use   Vaping Use: Never used  Substance and Sexual Activity   Alcohol use: No   Drug use: No   Sexual activity: Not on file  Other Topics Concern   Not on file  Social History Narrative   Part time work at [employer]. Does not regularly exercise.    Social Determinants of Health   Financial Resource Strain: Not on file  Food Insecurity: No Food Insecurity (04/13/2022)   Hunger Vital Sign    Worried About Running Out of Food in the Last Year: Never true    Ran Out of Food in the Last Year: Never true  Transportation Needs: No Transportation Needs (04/13/2022)   PRAPARE - Hydrologist (Medical): No    Lack of Transportation (Non-Medical): No  Physical Activity: Not on file  Stress: Not on file  Social Connections: Not on file     Family History:  The patient's Family history is unknown by patient.  ROS:   12-point review of systems is negative unless otherwise noted in the HPI.   EKGs/Labs/Other Studies Reviewed:    Studies reviewed were summarized above. The additional studies were reviewed today:  LHC 05/02/2022: Conclusions: Patent LAD stents with stable mild in-stent restenosis of remotely placed mid-distal LAD stents, unchanged from completion of last catheterization.  Recently placed proximal LAD stent is widely patent. Severe LCx/OM2 disease, similar to prior catheterizations. Mildly elevated left ventricular filling pressure. Successful complex bifurcation PCI to LCx/OM2 using [place] technique (Onyx frontier 2.75 x 30 mm main branch and 2.5 x 15 mm side branch drug-eluting stents) with 0% residual stenosis and TIMI-3 flow.   Recommendations: Continue dual antiplatelet therapy with aspirin and clopidogrel for at least 12 months. Aggressive secondary prevention. Overnight observation. ___________ [facility] 04/14/2022: Conclusions: Severe three-vessel coronary artery disease, as  detailed in Dr. Allison Quarry diagnostic catheterization report from yesterday. Successful PCI to proximal RCA using Onyx Frontier 2.5 x 18 mm drug-eluting stent with 0% residual stenosis and TIMI-3 flow. Successful PCI to proximal LAD stenosis using Onyx Frontier 3.5 x 15 mm drug-eluting stent with 0% residual stenosis and TIMI-3 flow. Successful angioplasty and drug-eluting stent placement to in-stent restenosis involving long segment of mid/distal LAD stenting.  The entire stent was angioplastied and an Onyx Frontier 2.0 x 18 mm drug-eluting stent placed at the distal edge, leading to reduction of stenosis  from 70% to 20-30% in the proximal and mid portions and 0% residual stenosis at the distal edge with TIMI-3 flow throughout the vessel.   Recommendations: Continue indefinite dual antiplatelet therapy with aspirin and clopidogrel. Aggressive secondary prevention of coronary artery disease. Staged PCI to LCx disease.  Timing will depend on patient's symptoms.  If he is angina free and otherwise stable for discharge over the weekend, staged PCI could be arranged to be done in the next 1 to 2 weeks as an outpatient.  If he has recurrent angina or is otherwise still in the hospital, PCI to the LCx could be performed early next week. __________   [facility] 04/13/2022:   --------Severe 3 Vessel CAD--------------   Small caliber-codominant RCA: Prox RCA-1 lesion is 70% stenosis tapers to Prox RCA-2 lesion is 99% stenosed. (Likely Culprit Lesion #1)   Codominant circumflex:  Prox Cx lesion is 55% stenosed.  1st Mrg lesion is 70% stenosed. Just past 1st Mrf, Mid Cx to Dist Cx lesion is 80% stenosed with 70% stenosed side branch in 2nd Mrg.   Prox LAD to Mid LAD lesion is 80% stenosed - focal/concentric.  Mid LAD to Dist LAD long stented segment is 70% stenosed. In-stent restenosis. Dist LAD lesion is 95% stenosed -> involving the distal stent edge into native vessel.   -------------------------------------------    LV end diastolic pressure is moderately elevated.   There is no aortic valve stenosis.     FOR FUTURE CARDIAC CATHETERIZATION, WOULD NOT RECOMMEND USING RIGHT RADIAL ACCESS DUE TO THE EXTREME TORTUOSITY OF THE SUBCLAVIAN-INNOMINATE ARTERY AND AORTIC ARCH.   PLAN OF CARE:  Continue with plan to admit overnight.  Will restart IV heparin 2 hours after sheath removal. \            Will need to meet with patient and family as well as have a heart team discussion with interventional cardiology and CVTS to determine best treatment options.  If he were to need PCI, may be best for him to be transferred to [facility] as the procedure would like to be prolonged and difficult.Potential needs to be noted to the patient's mental status, and baseline functional status. __________   2D echo 04/13/2022: 1. Left ventricular ejection fraction, by estimation, is 55 to 60%. The  left ventricle has normal function. The left ventricle demonstrates  regional wall motion abnormalities (hypokinesis of teh basal septal  region). There is mild left ventricular  hypertrophy. Left ventricular diastolic parameters are consistent with  Grade I diastolic dysfunction (impaired relaxation).   2. Right ventricular systolic function is normal. The right ventricular  size is normal. There is normal pulmonary artery systolic pressure. The  estimated right ventricular systolic pressure is 50.9 mmHg.   3. The mitral valve is normal in structure. No evidence of mitral valve  regurgitation. No evidence of mitral stenosis.   4. The aortic valve is normal in structure. Aortic valve regurgitation is  mild. Aortic valve sclerosis is present, with no evidence of aortic valve  stenosis.   5. The inferior vena cava is normal in size with greater than 50%  respiratory variability, suggesting right atrial pressure of 3 mmHg.  __________   Carotid artery ultrasound 12/06/2018: Summary:  Right Carotid: Velocities in the right ICA are  consistent with a 1-39%  stenosis.                Non-hemodynamically significant plaque <50% noted in the  CCA. The ECA appears <50% stenosed.  Left Carotid: Velocities in the left ICA are consistent with a 1-39%  stenosis.               Non-hemodynamically significant plaque <50% noted in the  CCA. The ECA appears >50% stenosed.   Vertebrals:  Bilateral vertebral arteries demonstrate antegrade flow.  Subclavians: Normal flow hemodynamics were seen in bilateral subclavian arteries.  __________   Elwyn Reach patch 05/2016: Normal sinus rhythm Min HR of 40 bpm, max HR of 171 bpm, and avg HR of 69 bpm.    7 Atrial tachycardia runs occurred, the longest lasting 7 beats with an avg rate of 105 bpm.   Isolated SVEs were rare (<1.0%), SVE Couplets were rare (<1.0%), and SVE Triplets were rare (<1.0%).  Isolated VEs were rare (<1.0%), VE Couplets were rare (<1.0%), and no VE Triplets were present. __________   [facility] 05/02/2016: Pharmacological myocardial perfusion imaging study with no significant  Ischemia Small region of predominantly fixed defect in the anteroapical wall of mild severity Normal wall motion (GI uptake artifact noted),  EF estimated at 59% No EKG changes concerning for ischemia at peak stress or in recovery. Severe hypertension noted, SBP 180 Low risk scan   EKG:  EKG is ordered today.  The EKG ordered today demonstrates NSR, 70 bpm, anterior T wave inversion consistent with known MI involving the LAD territory  Recent Labs: 04/12/2022: ALT 17 04/14/2022: Magnesium 1.9 05/03/2022: BUN 15; Creatinine, Ser 0.97; Platelets 301; Potassium 3.7; Sodium 137 05/05/2022: Hemoglobin 10.9  Recent Lipid Panel    Component Value Date/Time   CHOL 205 (H) 04/13/2022 0430   CHOL 132 05/29/2019 1113   CHOL 138 12/28/2012 0506   TRIG 257 (H) 04/13/2022 0430   TRIG 197 12/28/2012 0506   TRIG 111 08/06/2008 0000   HDL 40 (L) 04/13/2022 0430   HDL 34 (L) 05/29/2019 1113   HDL 34 (L)  12/28/2012 0506   CHOLHDL 5.1 04/13/2022 0430   VLDL 51 (H) 04/13/2022 0430   VLDL 39 12/28/2012 0506   LDLCALC 114 (H) 04/13/2022 0430   LDLCALC 64 05/29/2019 1113   LDLCALC 65 12/28/2012 0506    PHYSICAL EXAM:    VS:  BP (!) 144/64 (BP Location: Left Arm, Patient Position: Sitting, Cuff Size: Normal)   Pulse 70   Ht '5\' 7"'$  (1.702 m)   Wt 152 lb 12.8 oz (69.3 kg)   SpO2 100%   BMI 23.93 kg/m   BMI: Body mass index is 23.93 kg/m.  Physical Exam Vitals reviewed.  Constitutional:      Appearance: He is well-developed.  HENT:     Head: Normocephalic and atraumatic.  Eyes:     General:        Right eye: No discharge.        Left eye: No discharge.  Neck:     Vascular: No JVD.  Cardiovascular:     Rate and Rhythm: Normal rate and regular rhythm.     Heart sounds: Normal heart sounds, S1 normal and S2 normal. Heart sounds not distant. No midsystolic click and no opening snap. No murmur heard.    No friction rub.     Comments: Resolving soft bruising noted along the left lower extremity without active bleeding, swelling, warmth, or erythema.  No tenderness to palpation.  No bruit. Pulmonary:     Effort: Pulmonary effort is normal. No respiratory distress.     Breath sounds: Normal breath sounds. No decreased breath sounds, wheezing or rales.  Chest:  Chest wall: No tenderness.  Abdominal:     General: There is no distension.     Palpations: Abdomen is soft.     Tenderness: There is no abdominal tenderness.  Musculoskeletal:     Cervical back: Normal range of motion.     Right lower leg: No edema.     Left lower leg: No edema.  Skin:    General: Skin is warm and dry.     Nails: There is no clubbing.  Neurological:     Mental Status: He is alert and oriented to person, place, and time.  Psychiatric:        Speech: Speech normal.        Behavior: Behavior normal.        Thought Content: Thought content normal.        Judgment: Judgment normal.     Wt Readings  from Last 3 Encounters:  05/10/22 152 lb 12.8 oz (69.3 kg)  05/02/22 153 lb (69.4 kg)  04/26/22 153 lb 6.4 oz (69.6 kg)     ASSESSMENT & PLAN:   Multivessel CAD involving the native coronaries without angina: Status post recent complex PCI x 2 to the LAD and RCA in 04/2022 in the setting of NSTEMI status post staged PCI to the LCx most recently.  He is without symptoms of angina or cardiac decompensation.  Continue aggressive risk factor modification and secondary prevention including DAPT with aspirin and clopidogrel without interruption for a minimum of 12 months dating back to date of PCI.  He will also continue atorvastatin, lisinopril, and metoprolol.  He is cleared to participate with cardiac rehab.  No indication for further ischemic testing at this time.  There is some resolving soft bruising along the arteriotomy site.  No evidence of hematoma, active bleeding, or bruit.  HTN: Blood pressure is reasonably controlled in the office today.  He remains on lisinopril and metoprolol.  HLD: LDL 114 in 04/2022 with goal LDL being less than 55.  Now on atorvastatin 80 mg.  We will plan for a follow-up fasting lipid panel and LFT in 1 month with recommendation to escalate lipid therapy as indicated to achieve target LDL.  Atrial tachycardia: Quiescent on metoprolol.  Bilateral carotid artery disease: 1 to 39% bilateral ICA stenosis on last carotid artery ultrasound.  He remains on DAPT as outlined above with continuation of atorvastatin.  Carotid artery ultrasound is pending.  Bilateral claudication/weakness: ABIs and lower extremity arterial ultrasound pending.  He remains on DAPT and statin as outlined above.  I do wonder if some of his symptoms are related to deconditioning.  He will be participating in cardiac rehab as outlined above.  History of TIA/CVA: No new symptoms.  He remains on aspirin, Plavix, and atorvastatin as outlined above.  Pulmonary nodule: Patient's hospitalist and cardiology  have advised the patient and wife to follow-up with PCP.  Anemia: Stable on recent check.  No symptoms concerning for bleeding.   Disposition: F/u with Dr. Rockey Situ or an APP in 1 month.   Medication Adjustments/Labs and Tests Ordered: Current medicines are reviewed at length with the patient today.  Concerns regarding medicines are outlined above. Medication changes, Labs and Tests ordered today are summarized above and listed in the Patient Instructions accessible in Encounters.   Signed, Christell Faith, PA-C 05/10/2022 2:39 PM     [facility] [address] [place],  [address] [phone]

## 2022-05-10 ENCOUNTER — Ambulatory Visit: Payer: PPO | Attending: Physician Assistant | Admitting: Physician Assistant

## 2022-05-10 ENCOUNTER — Encounter: Payer: Self-pay | Admitting: Physician Assistant

## 2022-05-10 VITALS — BP 144/64 | HR 70 | Ht 67.0 in | Wt 152.8 lb

## 2022-05-10 DIAGNOSIS — Z8673 Personal history of transient ischemic attack (TIA), and cerebral infarction without residual deficits: Secondary | ICD-10-CM

## 2022-05-10 DIAGNOSIS — I739 Peripheral vascular disease, unspecified: Secondary | ICD-10-CM

## 2022-05-10 DIAGNOSIS — D649 Anemia, unspecified: Secondary | ICD-10-CM

## 2022-05-10 DIAGNOSIS — I4719 Other supraventricular tachycardia: Secondary | ICD-10-CM

## 2022-05-10 DIAGNOSIS — E785 Hyperlipidemia, unspecified: Secondary | ICD-10-CM | POA: Diagnosis not present

## 2022-05-10 DIAGNOSIS — I10 Essential (primary) hypertension: Secondary | ICD-10-CM

## 2022-05-10 DIAGNOSIS — I2510 Atherosclerotic heart disease of native coronary artery without angina pectoris: Secondary | ICD-10-CM

## 2022-05-10 DIAGNOSIS — R911 Solitary pulmonary nodule: Secondary | ICD-10-CM

## 2022-05-10 DIAGNOSIS — I6523 Occlusion and stenosis of bilateral carotid arteries: Secondary | ICD-10-CM

## 2022-05-10 NOTE — Patient Instructions (Signed)
Medication Instructions:  No changes at this time.   *If you need a refill on your cardiac medications before your next appointment, please call your pharmacy*   Lab Work: None  If you have labs (blood work) drawn today and your tests are completely normal, you will receive your results only by: [place] (if you have MyChart) OR A paper copy in the mail If you have any lab test that is abnormal or we need to change your treatment, we will call you to review the results.   Testing/Procedures: None   Follow-Up: At [facility] you and your health needs are our priority.  As part of our continuing mission to provide you with exceptional heart care, we have created designated Provider Care Teams.  These Care Teams include your primary Cardiologist (physician) and Advanced Practice Providers (APPs -  Physician Assistants and Nurse Practitioners) who all work together to provide you with the care you need, when you need it.  We recommend signing up for the patient portal called "MyChart".  Sign up information is provided on this After Visit Summary.  MyChart is used to connect with patients for Virtual Visits (Telemedicine).  Patients are able to view lab/test results, encounter notes, upcoming appointments, etc.  Non-urgent messages can be sent to your provider as well.   To learn more about what you can do with MyChart, go to [email or web address].    Your next appointment:   1 month(s)  Provider:   Ida Rogue, MD or Christell Faith, PA-C

## 2022-05-11 ENCOUNTER — Encounter: Payer: PPO | Attending: Internal Medicine | Admitting: *Deleted

## 2022-05-11 ENCOUNTER — Encounter: Payer: Self-pay | Admitting: *Deleted

## 2022-05-11 DIAGNOSIS — Z48812 Encounter for surgical aftercare following surgery on the circulatory system: Secondary | ICD-10-CM | POA: Insufficient documentation

## 2022-05-11 DIAGNOSIS — I252 Old myocardial infarction: Secondary | ICD-10-CM | POA: Insufficient documentation

## 2022-05-11 DIAGNOSIS — I214 Non-ST elevation (NSTEMI) myocardial infarction: Secondary | ICD-10-CM

## 2022-05-11 DIAGNOSIS — Z955 Presence of coronary angioplasty implant and graft: Secondary | ICD-10-CM | POA: Insufficient documentation

## 2022-05-11 NOTE — Addendum Note (Signed)
Addended by: James Ivanoff D on: 05/11/2022 09:30 AM   Modules accepted: Orders

## 2022-05-11 NOTE — Progress Notes (Signed)
Virtual orientation call completed today. he has an appointment on Date: [id number]  for EP eval and gym Orientation.  Documentation of diagnosis can be found in [facility] Date: 04/12/2022 .

## 2022-05-17 VITALS — Ht 68.5 in | Wt 152.0 lb

## 2022-05-17 DIAGNOSIS — Z48812 Encounter for surgical aftercare following surgery on the circulatory system: Secondary | ICD-10-CM | POA: Diagnosis not present

## 2022-05-17 DIAGNOSIS — I214 Non-ST elevation (NSTEMI) myocardial infarction: Secondary | ICD-10-CM

## 2022-05-17 DIAGNOSIS — I252 Old myocardial infarction: Secondary | ICD-10-CM | POA: Diagnosis not present

## 2022-05-17 DIAGNOSIS — Z955 Presence of coronary angioplasty implant and graft: Secondary | ICD-10-CM

## 2022-05-17 NOTE — Patient Instructions (Signed)
Patient Instructions  Patient Details  Name: Raymond Sampson MRN: [id number] Date of Birth: 11-19-1940 Referring Provider:  Nelva Bush, MD  Below are your personal goals for exercise, nutrition, and risk factors. Our goal is to help you stay on track towards obtaining and maintaining these goals. We will be discussing your progress on these goals with you throughout the program.  Initial Exercise Prescription:  Initial Exercise Prescription - 05/17/22 1500       Date of Initial Exercise RX and Referring Provider   Date 05/17/22    Referring Provider Dr Nelva Bush, MD      Oxygen   Maintain Oxygen Saturation 88% or higher      Treadmill   MPH 1.2    Grade 0    Minutes 15    METs 1.92      Recumbant Bike   Level 1    RPM 50    Watts 15    Minutes 15    METs 2.02      NuStep   Level 1    SPM 80    Minutes 15    METs 2.02      Prescription Details   Frequency (times per week) 3    Duration Progress to 30 minutes of continuous aerobic without signs/symptoms of physical distress      Intensity   THRR 40-80% of Max Heartrate 95-124    Ratings of Perceived Exertion 11-13    Perceived Dyspnea 0-4      Progression   Progression Continue to progress workloads to maintain intensity without signs/symptoms of physical distress.      Resistance Training   Training Prescription Yes    Weight 3 lb    Reps 10-15             Exercise Goals: Frequency: Be able to perform aerobic exercise two to three times per week in program working toward 2-5 days per week of home exercise.  Intensity: Work with a perceived exertion of 11 (fairly light) - 15 (hard) while following your exercise prescription.  We will make changes to your prescription with you as you progress through the program.   Duration: Be able to do 30 to 45 minutes of continuous aerobic exercise in addition to a 5 minute warm-up and a 5 minute cool-down routine.   Nutrition Goals: Your personal  nutrition goals will be established when you do your nutrition analysis with the dietician.  The following are general nutrition guidelines to follow: Cholesterol < 2102m/day Sodium < 15022mday Fiber: Men over 50 yrs - 30 grams per day  Personal Goals:  Personal Goals and Risk Factors at Admission - 05/17/22 1532       Core Components/Risk Factors/Patient Goals on Admission   Diabetes Yes    Intervention Provide education about signs/symptoms and action to take for hypo/hyperglycemia.;Provide education about proper nutrition, including hydration, and aerobic/resistive exercise prescription along with prescribed medications to achieve blood glucose in normal ranges: Fasting glucose 65-99 mg/dL    Expected Outcomes Short Term: Participant verbalizes understanding of the signs/symptoms and immediate care of hyper/hypoglycemia, proper foot care and importance of medication, aerobic/resistive exercise and nutrition plan for blood glucose control.;Long Term: Attainment of HbA1C < 7%.    Hypertension Yes    Intervention Provide education on lifestyle modifcations including regular physical activity/exercise, weight management, moderate sodium restriction and increased consumption of fresh fruit, vegetables, and low fat dairy, alcohol moderation, and smoking cessation.;Monitor prescription use compliance.    Expected  Outcomes Short Term: Continued assessment and intervention until BP is < 140/78m HG in hypertensive participants. < 130/857mHG in hypertensive participants with diabetes, heart failure or chronic kidney disease.;Long Term: Maintenance of blood pressure at goal levels.    Lipids Yes    Intervention Provide education and support for participant on nutrition & aerobic/resistive exercise along with prescribed medications to achieve LDL <7074mHDL >24m86m  Expected Outcomes Short Term: Participant states understanding of desired cholesterol values and is compliant with medications prescribed.  Participant is following exercise prescription and nutrition guidelines.;Long Term: Cholesterol controlled with medications as prescribed, with individualized exercise RX and with personalized nutrition plan. Value goals: LDL < 70mg98mL > 40 mg.             Exercise Goals and Review:  Exercise Goals     Row Name 05/17/22 1538             Exercise Goals   Increase Physical Activity Yes       Intervention Provide advice, education, support and counseling about physical activity/exercise needs.;Develop an individualized exercise prescription for aerobic and resistive training based on initial evaluation findings, risk stratification, comorbidities and participant's personal goals.       Expected Outcomes Short Term: Attend rehab on a regular basis to increase amount of physical activity.;Long Term: Add in home exercise to make exercise part of routine and to increase amount of physical activity.;Long Term: Exercising regularly at least 3-5 days a week.       Increase Strength and Stamina Yes       Intervention Develop an individualized exercise prescription for aerobic and resistive training based on initial evaluation findings, risk stratification, comorbidities and participant's personal goals.;Provide advice, education, support and counseling about physical activity/exercise needs.       Expected Outcomes Short Term: Perform resistance training exercises routinely during rehab and add in resistance training at home;Long Term: Improve cardiorespiratory fitness, muscular endurance and strength as measured by increased METs and functional capacity (6MWT);Short Term: Increase workloads from initial exercise prescription for resistance, speed, and METs.       Able to understand and use rate of perceived exertion (RPE) scale Yes       Intervention Provide education and explanation on how to use RPE scale       Expected Outcomes Short Term: Able to use RPE daily in rehab to express subjective  intensity level;Long Term:  Able to use RPE to guide intensity level when exercising independently       Able to understand and use Dyspnea scale Yes       Intervention Provide education and explanation on how to use Dyspnea scale       Expected Outcomes Short Term: Able to use Dyspnea scale daily in rehab to express subjective sense of shortness of breath during exertion;Long Term: Able to use Dyspnea scale to guide intensity level when exercising independently       Knowledge and understanding of Target Heart Rate Range (THRR) Yes       Intervention Provide education and explanation of THRR including how the numbers were predicted and where they are located for reference       Expected Outcomes Long Term: Able to use THRR to govern intensity when exercising independently;Short Term: Able to state/look up THRR;Short Term: Able to use daily as guideline for intensity in rehab       Able to check pulse independently Yes       Intervention Review  the importance of being able to check your own pulse for safety during independent exercise;Provide education and demonstration on how to check pulse in carotid and radial arteries.       Expected Outcomes Short Term: Able to explain why pulse checking is important during independent exercise;Long Term: Able to check pulse independently and accurately       Understanding of Exercise Prescription Yes       Intervention Provide education, explanation, and written materials on patient's individual exercise prescription       Expected Outcomes Short Term: Able to explain program exercise prescription;Long Term: Able to explain home exercise prescription to exercise independently

## 2022-05-17 NOTE — Progress Notes (Signed)
Cardiac Individual Treatment Plan  Patient Details  Name: Raymond Sampson MRN: [id number] Date of Birth: Sep 08, 1940 Referring Provider:   Flowsheet Row Cardiac Rehab from 05/17/2022 in [facility] Cardiac and Pulmonary Rehab  Referring Provider Dr Nelva Bush, MD       Initial Encounter Date:  Flowsheet Row Cardiac Rehab from 05/17/2022 in [facility] Cardiac and Pulmonary Rehab  Date 05/17/22       Visit Diagnosis: NSTEMI (non-ST elevation myocardial infarction) [facility])  Status post coronary artery stent placement  Patient's Home Medications on Admission:  Current Outpatient Medications:    aspirin 81 MG tablet, Take 81 mg by mouth daily., Disp: , Rfl:    atorvastatin (LIPITOR) 80 MG tablet, Take 1 tablet (80 mg total) by mouth daily., Disp: 30 tablet, Rfl: 2   clopidogrel (PLAVIX) 75 MG tablet, TAKE 1 TABLET BY MOUTH DAILY WITH BREAKFAST., Disp: 90 tablet, Rfl: 2   cyanocobalamin 1000 MCG tablet, Take 1,000 mcg by mouth daily., Disp: , Rfl:    lisinopril (ZESTRIL) 20 MG tablet, TAKE 1 TABLET BY MOUTH EVERY DAY, Disp: 90 tablet, Rfl: 2   metFORMIN (GLUCOPHAGE) 1000 MG tablet, Take 1 tablet (1,000 mg total) by mouth 2 (two) times daily with a meal. Resume 05/05/2022, Disp: , Rfl:    metoprolol tartrate (LOPRESSOR) 25 MG tablet, TAKE 1 TABLET BY MOUTH TWICE A DAY, Disp: 180 tablet, Rfl: 2   pantoprazole (PROTONIX) 20 MG tablet, Take 1 tablet (20 mg total) by mouth daily., Disp: 30 tablet, Rfl: 2   sertraline (ZOLOFT) 25 MG tablet, Take 1 tablet (25 mg total) by mouth daily., Disp: , Rfl:    sitaGLIPtin (JANUVIA) 25 MG tablet, Take 1 tablet by mouth daily., Disp: , Rfl:   Past Medical History: Past Medical History:  Diagnosis Date   Arthritic-like pain    CAD (coronary artery disease)    a. apical MI 5/07 with LHC showing 50% pCFX, 30% pRCA, 99% pLAD, 99% mLAD, 75% dLAD (cypher DES x3 to LAD); b. ETT-myoview (12/08) 81% MPHR,  EF 64%, partially reversible inferoapical perfusion defect similar  to prior study 12/07; c. 04/2016 MV: EF 59%, fixed anteroapical defect, no ischemia->Low risk.   Carotid arterial disease ([place])    a. 05/2016 Carotid U/S: 30-40% bilat dzs, f/u in 2 yrs.   Chest pain 04/13/2022   CVA (cerebral vascular accident) [facility])    a. 07/2010 - h/o CVA/TIA.   Diet-controlled type 2 diabetes mellitus (HCC)    HLD (hyperlipidemia)    HTN (hypertension)    PAT (paroxysmal atrial tachycardia)    a. 06/2016 Event monitor:  rare episodes of atrial tachycardia, longest 7 beats. No afib.    Tobacco Use: Social History   Tobacco Use  Smoking Status Former   Packs/day: 1.00   Years: 25.00   Total pack years: 25.00   Types: Cigarettes   Quit date: 04/17/1985   Years since quitting: 37.1  Smokeless Tobacco Never    Labs: Review Flowsheet  More data exists      Latest Ref Rng & Units 05/24/2009 12/28/2012 05/29/2019 04/12/2022 04/13/2022  Labs for ITP Cardiac and Pulmonary Rehab  Cholestrol 0 - 200 mg/dL 123  138  132  - 205   LDL (calc) 0 - 99 mg/dL 58  65  64  - 114   HDL-C >40 mg/dL 38  34  34  - 40   Trlycerides <150 mg/dL 133  197  204  - 257   Hemoglobin A1c 4.8 - 5.6 % - - -  6.8  -     Exercise Target Goals: Exercise Program Goal: Individual exercise prescription set using results from initial 6 min walk test and THRR while considering  patient's activity barriers and safety.   Exercise Prescription Goal: Initial exercise prescription builds to 30-45 minutes a day of aerobic activity, 2-3 days per week.  Home exercise guidelines will be given to patient during program as part of exercise prescription that the participant will acknowledge.   Education: Aerobic Exercise: - Group verbal and visual presentation on the components of exercise prescription. Introduces F.I.T.T principle from ACSM for exercise prescriptions.  Reviews F.I.T.T. principles of aerobic exercise including progression. Written material given at graduation. Flowsheet Row Cardiac Rehab from  05/17/2022 in [facility] Cardiac and Pulmonary Rehab  Education need identified 05/17/22       Education: Resistance Exercise: - Group verbal and visual presentation on the components of exercise prescription. Introduces F.I.T.T principle from ACSM for exercise prescriptions  Reviews F.I.T.T. principles of resistance exercise including progression. Written material given at graduation.    Education: Exercise & Equipment Safety: - Individual verbal instruction and demonstration of equipment use and safety with use of the equipment. Flowsheet Row Cardiac Rehab from 05/17/2022 in [facility] Cardiac and Pulmonary Rehab  Date 05/17/22  Educator NT  Instruction Review Code 1- Verbalizes Understanding       Education: Exercise Physiology & General Exercise Guidelines: - Group verbal and written instruction with models to review the exercise physiology of the cardiovascular system and associated critical values. Provides general exercise guidelines with specific guidelines to those with heart or lung disease.    Education: Flexibility, Balance, Mind/Body Relaxation: - Group verbal and visual presentation with interactive activity on the components of exercise prescription. Introduces F.I.T.T principle from ACSM for exercise prescriptions. Reviews F.I.T.T. principles of flexibility and balance exercise training including progression. Also discusses the mind body connection.  Reviews various relaxation techniques to help reduce and manage stress (i.e. Deep breathing, progressive muscle relaxation, and visualization). Balance handout provided to take home. Written material given at graduation.   Activity Barriers & Risk Stratification:  Activity Barriers & Cardiac Risk Stratification - 05/17/22 1538       Activity Barriers & Cardiac Risk Stratification   Activity Barriers None    Cardiac Risk Stratification High             6 Minute Walk:  6 Minute Walk     Row Name 05/17/22 1533         6  Minute Walk   Phase Initial     Distance 815 feet     Walk Time 5.78 minutes     # of Rest Breaks 2     MPH 1.54     METS 2.02     RPE 12     Perceived Dyspnea  0     VO2 Peak 7.06     Symptoms No     Resting HR 67 bpm     Resting BP 142/60     Resting Oxygen Saturation  100 %     Exercise Oxygen Saturation  during 6 min walk 95 %     Max Ex. HR 96 bpm     Max Ex. BP 172/74     2 Minute Post BP 160/68              Oxygen Initial Assessment:   Oxygen Re-Evaluation:   Oxygen Discharge (Final Oxygen Re-Evaluation):   Initial Exercise Prescription:  Initial Exercise Prescription -  05/17/22 1500       Date of Initial Exercise RX and Referring Provider   Date 05/17/22    Referring Provider Dr Nelva Bush, MD      Oxygen   Maintain Oxygen Saturation 88% or higher      Treadmill   MPH 1.2    Grade 0    Minutes 15    METs 1.92      Recumbant Bike   Level 1    RPM 50    Watts 15    Minutes 15    METs 2.02      NuStep   Level 1    SPM 80    Minutes 15    METs 2.02      Prescription Details   Frequency (times per week) 3    Duration Progress to 30 minutes of continuous aerobic without signs/symptoms of physical distress      Intensity   THRR 40-80% of Max Heartrate 95-124    Ratings of Perceived Exertion 11-13    Perceived Dyspnea 0-4      Progression   Progression Continue to progress workloads to maintain intensity without signs/symptoms of physical distress.      Resistance Training   Training Prescription Yes    Weight 3 lb    Reps 10-15             Perform Capillary Blood Glucose checks as needed.  Exercise Prescription Changes:   Exercise Prescription Changes     Row Name 05/17/22 1500             Response to Exercise   Blood Pressure (Admit) 142/60       Blood Pressure (Exercise) 172/74       Blood Pressure (Exit) 160/68       Heart Rate (Admit) 67 bpm       Heart Rate (Exercise) 96 bpm       Heart Rate (Exit) 70  bpm       Oxygen Saturation (Admit) 100 %       Oxygen Saturation (Exercise) 95 %       Rating of Perceived Exertion (Exercise) 12       Perceived Dyspnea (Exercise) 0       Symptoms none       Comments 6MWT Results                Exercise Comments:   Exercise Goals and Review:   Exercise Goals     Row Name 05/17/22 1538             Exercise Goals   Increase Physical Activity Yes       Intervention Provide advice, education, support and counseling about physical activity/exercise needs.;Develop an individualized exercise prescription for aerobic and resistive training based on initial evaluation findings, risk stratification, comorbidities and participant's personal goals.       Expected Outcomes Short Term: Attend rehab on a regular basis to increase amount of physical activity.;Long Term: Add in home exercise to make exercise part of routine and to increase amount of physical activity.;Long Term: Exercising regularly at least 3-5 days a week.       Increase Strength and Stamina Yes       Intervention Develop an individualized exercise prescription for aerobic and resistive training based on initial evaluation findings, risk stratification, comorbidities and participant's personal goals.;Provide advice, education, support and counseling about physical activity/exercise needs.       Expected Outcomes Short Term: Perform  resistance training exercises routinely during rehab and add in resistance training at home;Long Term: Improve cardiorespiratory fitness, muscular endurance and strength as measured by increased METs and functional capacity (6MWT);Short Term: Increase workloads from initial exercise prescription for resistance, speed, and METs.       Able to understand and use rate of perceived exertion (RPE) scale Yes       Intervention Provide education and explanation on how to use RPE scale       Expected Outcomes Short Term: Able to use RPE daily in rehab to express subjective  intensity level;Long Term:  Able to use RPE to guide intensity level when exercising independently       Able to understand and use Dyspnea scale Yes       Intervention Provide education and explanation on how to use Dyspnea scale       Expected Outcomes Short Term: Able to use Dyspnea scale daily in rehab to express subjective sense of shortness of breath during exertion;Long Term: Able to use Dyspnea scale to guide intensity level when exercising independently       Knowledge and understanding of Target Heart Rate Range (THRR) Yes       Intervention Provide education and explanation of THRR including how the numbers were predicted and where they are located for reference       Expected Outcomes Long Term: Able to use THRR to govern intensity when exercising independently;Short Term: Able to state/look up THRR;Short Term: Able to use daily as guideline for intensity in rehab       Able to check pulse independently Yes       Intervention Review the importance of being able to check your own pulse for safety during independent exercise;Provide education and demonstration on how to check pulse in carotid and radial arteries.       Expected Outcomes Short Term: Able to explain why pulse checking is important during independent exercise;Long Term: Able to check pulse independently and accurately       Understanding of Exercise Prescription Yes       Intervention Provide education, explanation, and written materials on patient's individual exercise prescription       Expected Outcomes Short Term: Able to explain program exercise prescription;Long Term: Able to explain home exercise prescription to exercise independently                Exercise Goals Re-Evaluation :   Discharge Exercise Prescription (Final Exercise Prescription Changes):  Exercise Prescription Changes - 05/17/22 1500       Response to Exercise   Blood Pressure (Admit) 142/60    Blood Pressure (Exercise) 172/74    Blood  Pressure (Exit) 160/68    Heart Rate (Admit) 67 bpm    Heart Rate (Exercise) 96 bpm    Heart Rate (Exit) 70 bpm    Oxygen Saturation (Admit) 100 %    Oxygen Saturation (Exercise) 95 %    Rating of Perceived Exertion (Exercise) 12    Perceived Dyspnea (Exercise) 0    Symptoms none    Comments 6MWT Results             Nutrition:  Target Goals: Understanding of nutrition guidelines, daily intake of sodium <1552m, cholesterol <2027m calories 30% from fat and 7% or less from saturated fats, daily to have 5 or more servings of fruits and vegetables.  Education: All About Nutrition: -Group instruction provided by verbal, written material, interactive activities, discussions, models, and posters to present general guidelines for  heart healthy nutrition including fat, fiber, MyPlate, the role of sodium in heart healthy nutrition, utilization of the nutrition label, and utilization of this knowledge for meal planning. Follow up email sent as well. Written material given at graduation.   Biometrics:  Pre Biometrics - 05/17/22 1538       Pre Biometrics   Height 5' 8.5" (1.74 m)    Weight 152 lb (68.9 kg)    Waist Circumference 36 inches    Hip Circumference 36 inches    Waist to Hip Ratio 1 %    BMI (Calculated) 22.77    Single Leg Stand 1.6 seconds              Nutrition Therapy Plan and Nutrition Goals:  Nutrition Therapy & Goals - 05/17/22 1525       Intervention Plan   Intervention Prescribe, educate and counsel regarding individualized specific dietary modifications aiming towards targeted core components such as weight, hypertension, lipid management, diabetes, heart failure and other comorbidities.    Expected Outcomes Short Term Goal: Understand basic principles of dietary content, such as calories, fat, sodium, cholesterol and nutrients.;Short Term Goal: A plan has been developed with personal nutrition goals set during dietitian appointment.;Long Term Goal: Adherence  to prescribed nutrition plan.             Nutrition Assessments:  MEDIFICTS Score Key: ?70 Need to make dietary changes  40-70 Heart Healthy Diet ? 40 Therapeutic Level Cholesterol Diet  Flowsheet Row Cardiac Rehab from 05/17/2022 in [facility] Cardiac and Pulmonary Rehab  Picture Your Plate Total Score on Admission 56      Picture Your Plate Scores: [id number] Unhealthy dietary pattern with much room for improvement. 41-50 Dietary pattern unlikely to meet recommendations for good health and room for improvement. 51-60 More healthful dietary pattern, with some room for improvement.  >60 Healthy dietary pattern, although there may be some specific behaviors that could be improved.    Nutrition Goals Re-Evaluation:   Nutrition Goals Discharge (Final Nutrition Goals Re-Evaluation):   Psychosocial: Target Goals: Acknowledge presence or absence of significant depression and/or stress, maximize coping skills, provide positive support system. Participant is able to verbalize types and ability to use techniques and skills needed for reducing stress and depression.   Education: Stress, Anxiety, and Depression - Group verbal and visual presentation to define topics covered.  Reviews how body is impacted by stress, anxiety, and depression.  Also discusses healthy ways to reduce stress and to treat/manage anxiety and depression.  Written material given at graduation.   Education: Sleep Hygiene -Provides group verbal and written instruction about how sleep can affect your health.  Define sleep hygiene, discuss sleep cycles and impact of sleep habits. Review good sleep hygiene tips.    Initial Review & Psychosocial Screening:  Initial Psych Review & Screening - 05/11/22 1358       Initial Review   Current issues with Current Psychotropic Meds;Current Anxiety/Panic      Family Dynamics   Good Support System? Yes   wife,daughter in law,   all children   Comments would have anxiety       Barriers   Psychosocial barriers to participate in program There are no identifiable barriers or psychosocial needs.      Screening Interventions   Interventions Encouraged to exercise;To provide support and resources with identified psychosocial needs;Provide feedback about the scores to participant    Expected Outcomes Short Term goal: Utilizing psychosocial counselor, staff and physician to assist with identification  of specific Stressors or current issues interfering with healing process. Setting desired goal for each stressor or current issue identified.;Long Term Goal: Stressors or current issues are controlled or eliminated.;Short Term goal: Identification and review with participant of any Quality of Life or Depression concerns found by scoring the questionnaire.;Long Term goal: The participant improves quality of Life and PHQ9 Scores as seen by post scores and/or verbalization of changes             Quality of Life Scores:   Quality of Life - 05/17/22 1526       Quality of Life   Select Quality of Life      Quality of Life Scores   Health/Function Pre 17.93 %    Socioeconomic Pre 22.75 %    Psych/Spiritual Pre 24.21 %    Family Pre 24.9 %    GLOBAL Pre 21.2 %            Scores of 19 and below usually indicate a poorer quality of life in these areas.  A difference of  2-3 points is a clinically meaningful difference.  A difference of 2-3 points in the total score of the Quality of Life Index has been associated with significant improvement in overall quality of life, self-image, physical symptoms, and general health in studies assessing change in quality of life.  PHQ-9: Review Flowsheet       05/17/2022  Depression screen PHQ 2/9  Decreased Interest 0  Down, Depressed, Hopeless 0  PHQ - 2 Score 0  Altered sleeping 1  Tired, decreased energy 0  Change in appetite 0  Feeling bad or failure about yourself  0  Trouble concentrating 1  Moving slowly or  fidgety/restless 0  Suicidal thoughts 0  PHQ-9 Score 2  Difficult doing work/chores Not difficult at all   Interpretation of Total Score  Total Score Depression Severity:  1-4 = Minimal depression, 5-9 = Mild depression, 10-14 = Moderate depression, 15-19 = Moderately severe depression, 20-27 = Severe depression   Psychosocial Evaluation and Intervention:  Psychosocial Evaluation - 05/11/22 1413       Psychosocial Evaluation & Interventions   Interventions Encouraged to exercise with the program and follow exercise prescription    Comments [place] has no barriers to attending the program. He lives with his wife,[name],of 30 years. She and their children are his support.  He does take Zoloft for history of anxiety. He would worry about others and get stressed.  He does have some short term memory concerns;diagnosis of mild dementia. He is ready to start the program and build up strength and energy.    Expected Outcomes STG Kamani is able to attend all scheduled sessions. He is able to progress with his exercise  He is able to reduce any anxieties he has. LTG Rollin continues his exercise progression and keep anxiety reduced    Continue Psychosocial Services  Follow up required by staff             Psychosocial Re-Evaluation:   Psychosocial Discharge (Final Psychosocial Re-Evaluation):   Vocational Rehabilitation: Provide vocational rehab assistance to qualifying candidates.   Vocational Rehab Evaluation & Intervention:  Vocational Rehab - 05/11/22 1406       Initial Vocational Rehab Evaluation & Intervention   Assessment shows need for Vocational Rehabilitation No      Vocational Rehab Re-Evaulation   Comments retired             Education: Education Goals: Education classes will be provided  on a variety of topics geared toward better understanding of heart health and risk factor modification. Participant will state understanding/return demonstration of topics  presented as noted by education test scores.  Learning Barriers/Preferences:  Learning Barriers/Preferences - 05/11/22 1404       Learning Barriers/Preferences   Learning Barriers Inability to learn new things   memory retention not good   Learning Preferences None             General Cardiac Education Topics:  AED/CPR: - Group verbal and written instruction with the use of models to demonstrate the basic use of the AED with the basic ABC's of resuscitation.   Anatomy and Cardiac Procedures: - Group verbal and visual presentation and models provide information about basic cardiac anatomy and function. Reviews the testing methods done to diagnose heart disease and the outcomes of the test results. Describes the treatment choices: Medical Management, Angioplasty, or Coronary Bypass Surgery for treating various heart conditions including Myocardial Infarction, Angina, Valve Disease, and Cardiac Arrhythmias.  Written material given at graduation. Flowsheet Row Cardiac Rehab from 05/17/2022 in [facility] Cardiac and Pulmonary Rehab  Education need identified 05/17/22       Medication Safety: - Group verbal and visual instruction to review commonly prescribed medications for heart and lung disease. Reviews the medication, class of the drug, and side effects. Includes the steps to properly store meds and maintain the prescription regimen.  Written material given at graduation.   Intimacy: - Group verbal instruction through game format to discuss how heart and lung disease can affect sexual intimacy. Written material given at graduation..   Know Your Numbers and Heart Failure: - Group verbal and visual instruction to discuss disease risk factors for cardiac and pulmonary disease and treatment options.  Reviews associated critical values for Overweight/Obesity, Hypertension, Cholesterol, and Diabetes.  Discusses basics of heart failure: signs/symptoms and treatments.  Introduces Heart Failure  Zone chart for action plan for heart failure.  Written material given at graduation.   Infection Prevention: - Provides verbal and written material to individual with discussion of infection control including proper hand washing and proper equipment cleaning during exercise session. Flowsheet Row Cardiac Rehab from 05/17/2022 in [facility] Cardiac and Pulmonary Rehab  Date 05/17/22  Educator NT  Instruction Review Code 1- Verbalizes Understanding       Falls Prevention: - Provides verbal and written material to individual with discussion of falls prevention and safety. Flowsheet Row Cardiac Rehab from 05/17/2022 in [facility] Cardiac and Pulmonary Rehab  Date 05/11/22  Educator SB  Instruction Review Code 1- Verbalizes Understanding       Other: -Provides group and verbal instruction on various topics (see comments)   Knowledge Questionnaire Score:  Knowledge Questionnaire Score - 05/17/22 1524       Knowledge Questionnaire Score   Pre Score 22/26             Core Components/Risk Factors/Patient Goals at Admission:  Personal Goals and Risk Factors at Admission - 05/17/22 1532       Core Components/Risk Factors/Patient Goals on Admission   Diabetes Yes    Intervention Provide education about signs/symptoms and action to take for hypo/hyperglycemia.;Provide education about proper nutrition, including hydration, and aerobic/resistive exercise prescription along with prescribed medications to achieve blood glucose in normal ranges: Fasting glucose 65-99 mg/dL    Expected Outcomes Short Term: Participant verbalizes understanding of the signs/symptoms and immediate care of hyper/hypoglycemia, proper foot care and importance of medication, aerobic/resistive exercise and nutrition plan for  blood glucose control.;Long Term: Attainment of HbA1C < 7%.    Hypertension Yes    Intervention Provide education on lifestyle modifcations including regular physical activity/exercise, weight  management, moderate sodium restriction and increased consumption of fresh fruit, vegetables, and low fat dairy, alcohol moderation, and smoking cessation.;Monitor prescription use compliance.    Expected Outcomes Short Term: Continued assessment and intervention until BP is < 140/56m HG in hypertensive participants. < 130/842mHG in hypertensive participants with diabetes, heart failure or chronic kidney disease.;Long Term: Maintenance of blood pressure at goal levels.    Lipids Yes    Intervention Provide education and support for participant on nutrition & aerobic/resistive exercise along with prescribed medications to achieve LDL <7077mHDL >27m90m  Expected Outcomes Short Term: Participant states understanding of desired cholesterol values and is compliant with medications prescribed. Participant is following exercise prescription and nutrition guidelines.;Long Term: Cholesterol controlled with medications as prescribed, with individualized exercise RX and with personalized nutrition plan. Value goals: LDL < 70mg34mL > 40 mg.             Education:Diabetes - Individual verbal and written instruction to review signs/symptoms of diabetes, desired ranges of glucose level fasting, after meals and with exercise. Acknowledge that pre and post exercise glucose checks will be done for 3 sessions at entry of program.   Core Components/Risk Factors/Patient Goals Review:    Core Components/Risk Factors/Patient Goals at Discharge (Final Review):    ITP Comments:  ITP Comments     Row Name 05/11/22 1412 05/17/22 1523         ITP Comments Virtual orientation call completed today. he has an appointment on Date: [id number] EP eval and gym Orientation.  [facility] . Completed 6MWT and gym orientation. Initial ITP created and sent for review to Dr. Mark Emily Filbertical Director.               Comments: Initial ITP

## 2022-05-22 ENCOUNTER — Encounter: Payer: PPO | Admitting: *Deleted

## 2022-05-22 DIAGNOSIS — I214 Non-ST elevation (NSTEMI) myocardial infarction: Secondary | ICD-10-CM

## 2022-05-22 DIAGNOSIS — I252 Old myocardial infarction: Secondary | ICD-10-CM | POA: Diagnosis not present

## 2022-05-22 DIAGNOSIS — Z955 Presence of coronary angioplasty implant and graft: Secondary | ICD-10-CM

## 2022-05-22 LAB — GLUCOSE, CAPILLARY
Glucose-Capillary: 132 mg/dL — ABNORMAL HIGH (ref 70–99)
Glucose-Capillary: 99 mg/dL (ref 70–99)

## 2022-05-22 NOTE — Progress Notes (Signed)
Daily Session Note  Patient Details  Name: Raymond Sampson MRN: [id number] Date of Birth: 01/25/1941 Referring Provider:   Flowsheet Row Cardiac Rehab from 05/17/2022 in [facility] Cardiac and Pulmonary Rehab  Referring Provider Dr Nelva Bush, MD       Encounter Date: 05/22/2022  Check In:  Session Check In - 05/22/22 1336       Check-In   Supervising physician immediately available to respond to emergencies See telemetry face sheet for immediately available ER MD    Location [facility]-Cardiac & Pulmonary Rehab    Staff Present Justin Mend, Lorre Nick, MA, RCEP, CCRP, Mindi Curling, RN, [place]    Virtual Visit No    Medication changes reported     No    Fall or balance concerns reported    No    Warm-up and Cool-down Performed on first and last piece of equipment    Resistance Training Performed Yes    VAD Patient? No    PAD/SET Patient? No      Pain Assessment   Currently in Pain? No/denies                Social History   Tobacco Use  Smoking Status Former   Packs/day: 1.00   Years: 25.00   Total pack years: 25.00   Types: Cigarettes   Quit date: 04/17/1985   Years since quitting: 37.1  Smokeless Tobacco Never    Goals Met:  Exercise tolerated well Personal goals reviewed No report of concerns or symptoms today Strength training completed today  Goals Unmet:  Not Applicable  Comments: First full day of exercise!  Patient was oriented to gym and equipment including functions, settings, policies, and procedures.  Patient's individual exercise prescription and treatment plan were reviewed.  All starting workloads were established based on the results of the 6 minute walk test done at initial orientation visit.  The plan for exercise progression was also introduced and progression will be customized based on patient's performance and goals.    Dr. Emily Filbert is Medical Director for [place].  Dr. Ottie Glazier is  Medical Director for [facility] Pulmonary Rehabilitation.

## 2022-05-24 ENCOUNTER — Encounter: Payer: Self-pay | Admitting: *Deleted

## 2022-05-24 ENCOUNTER — Encounter: Payer: PPO | Admitting: *Deleted

## 2022-05-24 DIAGNOSIS — I214 Non-ST elevation (NSTEMI) myocardial infarction: Secondary | ICD-10-CM

## 2022-05-24 DIAGNOSIS — Z955 Presence of coronary angioplasty implant and graft: Secondary | ICD-10-CM

## 2022-05-24 DIAGNOSIS — I252 Old myocardial infarction: Secondary | ICD-10-CM | POA: Diagnosis not present

## 2022-05-24 LAB — GLUCOSE, CAPILLARY
Glucose-Capillary: 135 mg/dL — ABNORMAL HIGH (ref 70–99)
Glucose-Capillary: 109 mg/dL — ABNORMAL HIGH (ref 70–99)

## 2022-05-24 NOTE — Progress Notes (Signed)
Daily Session Note  Patient Details  Name: ALPHONZO SAAH MRN: [id number] Date of Birth: 13-Jan-1941 Referring Provider:   Flowsheet Row Cardiac Rehab from 05/17/2022 in [facility] Cardiac and Pulmonary Rehab  Referring Provider Dr Nelva Bush, MD       Encounter Date: 05/24/2022  Check In:  Session Check In - 05/24/22 1344       Check-In   Supervising physician immediately available to respond to emergencies See telemetry face sheet for immediately available ER MD    Location [facility]-Cardiac & Pulmonary Rehab    Staff Present Renita Papa, RN Moises Blood, BS, ACSM CEP, Exercise Physiologist;Megan Tamala Julian, RN, Lorin Mercy, MS, ACSM CEP, Exercise Physiologist    Virtual Visit No    Medication changes reported     No    Fall or balance concerns reported    No    Warm-up and Cool-down Performed on first and last piece of equipment    Resistance Training Performed Yes    VAD Patient? No    PAD/SET Patient? No      Pain Assessment   Currently in Pain? No/denies                Social History   Tobacco Use  Smoking Status Former   Packs/day: 1.00   Years: 25.00   Total pack years: 25.00   Types: Cigarettes   Quit date: 04/17/1985   Years since quitting: 37.1  Smokeless Tobacco Never    Goals Met:  Independence with exercise equipment Exercise tolerated well No report of concerns or symptoms today Strength training completed today  Goals Unmet:  Not Applicable  Comments: Pt able to follow exercise prescription today without complaint.  Will continue to monitor for progression.    Dr. Emily Filbert is Medical Director for [place].  Dr. Ottie Glazier is Medical Director for [facility] Pulmonary Rehabilitation.

## 2022-05-24 NOTE — Progress Notes (Signed)
Cardiac Individual Treatment Plan  Patient Details  Name: Raymond Sampson MRN: [id number] Date of Birth: Oct 02, 1940 Referring Provider:   Flowsheet Row Cardiac Rehab from 05/17/2022 in [facility] Cardiac and Pulmonary Rehab  Referring Provider Dr Nelva Bush, MD       Initial Encounter Date:  Flowsheet Row Cardiac Rehab from 05/17/2022 in [facility] Cardiac and Pulmonary Rehab  Date 05/17/22       Visit Diagnosis: NSTEMI (non-ST elevation myocardial infarction) [facility])  Status post coronary artery stent placement  Patient's Home Medications on Admission:  Current Outpatient Medications:    aspirin 81 MG tablet, Take 81 mg by mouth daily., Disp: , Rfl:    atorvastatin (LIPITOR) 80 MG tablet, Take 1 tablet (80 mg total) by mouth daily., Disp: 30 tablet, Rfl: 2   clopidogrel (PLAVIX) 75 MG tablet, TAKE 1 TABLET BY MOUTH DAILY WITH BREAKFAST., Disp: 90 tablet, Rfl: 2   cyanocobalamin 1000 MCG tablet, Take 1,000 mcg by mouth daily., Disp: , Rfl:    lisinopril (ZESTRIL) 20 MG tablet, TAKE 1 TABLET BY MOUTH EVERY DAY, Disp: 90 tablet, Rfl: 2   metFORMIN (GLUCOPHAGE) 1000 MG tablet, Take 1 tablet (1,000 mg total) by mouth 2 (two) times daily with a meal. Resume 05/05/2022, Disp: , Rfl:    metoprolol tartrate (LOPRESSOR) 25 MG tablet, TAKE 1 TABLET BY MOUTH TWICE A DAY, Disp: 180 tablet, Rfl: 2   pantoprazole (PROTONIX) 20 MG tablet, Take 1 tablet (20 mg total) by mouth daily., Disp: 30 tablet, Rfl: 2   sertraline (ZOLOFT) 25 MG tablet, Take 1 tablet (25 mg total) by mouth daily., Disp: , Rfl:    sitaGLIPtin (JANUVIA) 25 MG tablet, Take 1 tablet by mouth daily., Disp: , Rfl:   Past Medical History: Past Medical History:  Diagnosis Date   Arthritic-like pain    CAD (coronary artery disease)    a. apical MI 5/07 with LHC showing 50% pCFX, 30% pRCA, 99% pLAD, 99% mLAD, 75% dLAD (cypher DES x3 to LAD); b. ETT-myoview (12/08) 81% MPHR,  EF 64%, partially reversible inferoapical perfusion defect similar  to prior study 12/07; c. 04/2016 MV: EF 59%, fixed anteroapical defect, no ischemia->Low risk.   Carotid arterial disease ([place])    a. 05/2016 Carotid U/S: 30-40% bilat dzs, f/u in 2 yrs.   Chest pain 04/13/2022   CVA (cerebral vascular accident) [facility])    a. 07/2010 - h/o CVA/TIA.   Diet-controlled type 2 diabetes mellitus (HCC)    HLD (hyperlipidemia)    HTN (hypertension)    PAT (paroxysmal atrial tachycardia)    a. 06/2016 Event monitor:  rare episodes of atrial tachycardia, longest 7 beats. No afib.    Tobacco Use: Social History   Tobacco Use  Smoking Status Former   Packs/day: 1.00   Years: 25.00   Total pack years: 25.00   Types: Cigarettes   Quit date: 04/17/1985   Years since quitting: 37.1  Smokeless Tobacco Never    Labs: Review Flowsheet  More data exists      Latest Ref Rng & Units 05/24/2009 12/28/2012 05/29/2019 04/12/2022 04/13/2022  Labs for ITP Cardiac and Pulmonary Rehab  Cholestrol 0 - 200 mg/dL 123  138  132  - 205   LDL (calc) 0 - 99 mg/dL 58  65  64  - 114   HDL-C >40 mg/dL 38  34  34  - 40   Trlycerides <150 mg/dL 133  197  204  - 257   Hemoglobin A1c 4.8 - 5.6 % - - -  6.8  -     Exercise Target Goals: Exercise Program Goal: Individual exercise prescription set using results from initial 6 min walk test and THRR while considering  patient's activity barriers and safety.   Exercise Prescription Goal: Initial exercise prescription builds to 30-45 minutes a day of aerobic activity, 2-3 days per week.  Home exercise guidelines will be given to patient during program as part of exercise prescription that the participant will acknowledge.   Education: Aerobic Exercise: - Group verbal and visual presentation on the components of exercise prescription. Introduces F.I.T.T principle from ACSM for exercise prescriptions.  Reviews F.I.T.T. principles of aerobic exercise including progression. Written material given at graduation. Flowsheet Row Cardiac Rehab from  05/24/2022 in [facility] Cardiac and Pulmonary Rehab  Education need identified 05/17/22       Education: Resistance Exercise: - Group verbal and visual presentation on the components of exercise prescription. Introduces F.I.T.T principle from ACSM for exercise prescriptions  Reviews F.I.T.T. principles of resistance exercise including progression. Written material given at graduation.    Education: Exercise & Equipment Safety: - Individual verbal instruction and demonstration of equipment use and safety with use of the equipment. Flowsheet Row Cardiac Rehab from 05/24/2022 in [facility] Cardiac and Pulmonary Rehab  Date 05/17/22  Educator NT  Instruction Review Code 1- Verbalizes Understanding       Education: Exercise Physiology & General Exercise Guidelines: - Group verbal and written instruction with models to review the exercise physiology of the cardiovascular system and associated critical values. Provides general exercise guidelines with specific guidelines to those with heart or lung disease.    Education: Flexibility, Balance, Mind/Body Relaxation: - Group verbal and visual presentation with interactive activity on the components of exercise prescription. Introduces F.I.T.T principle from ACSM for exercise prescriptions. Reviews F.I.T.T. principles of flexibility and balance exercise training including progression. Also discusses the mind body connection.  Reviews various relaxation techniques to help reduce and manage stress (i.e. Deep breathing, progressive muscle relaxation, and visualization). Balance handout provided to take home. Written material given at graduation.   Activity Barriers & Risk Stratification:  Activity Barriers & Cardiac Risk Stratification - 05/17/22 1538       Activity Barriers & Cardiac Risk Stratification   Activity Barriers None    Cardiac Risk Stratification High             6 Minute Walk:  6 Minute Walk     Row Name 05/17/22 1533         6  Minute Walk   Phase Initial     Distance 815 feet     Walk Time 5.78 minutes     # of Rest Breaks 2     MPH 1.54     METS 2.02     RPE 12     Perceived Dyspnea  0     VO2 Peak 7.06     Symptoms No     Resting HR 67 bpm     Resting BP 142/60     Resting Oxygen Saturation  100 %     Exercise Oxygen Saturation  during 6 min walk 95 %     Max Ex. HR 96 bpm     Max Ex. BP 172/74     2 Minute Post BP 160/68              Oxygen Initial Assessment:   Oxygen Re-Evaluation:   Oxygen Discharge (Final Oxygen Re-Evaluation):   Initial Exercise Prescription:  Initial Exercise Prescription -  05/17/22 1500       Date of Initial Exercise RX and Referring Provider   Date 05/17/22    Referring Provider Dr Nelva Bush, MD      Oxygen   Maintain Oxygen Saturation 88% or higher      Treadmill   MPH 1.2    Grade 0    Minutes 15    METs 1.92      Recumbant Bike   Level 1    RPM 50    Watts 15    Minutes 15    METs 2.02      NuStep   Level 1    SPM 80    Minutes 15    METs 2.02      Prescription Details   Frequency (times per week) 3    Duration Progress to 30 minutes of continuous aerobic without signs/symptoms of physical distress      Intensity   THRR 40-80% of Max Heartrate 95-124    Ratings of Perceived Exertion 11-13    Perceived Dyspnea 0-4      Progression   Progression Continue to progress workloads to maintain intensity without signs/symptoms of physical distress.      Resistance Training   Training Prescription Yes    Weight 3 lb    Reps 10-15             Perform Capillary Blood Glucose checks as needed.  Exercise Prescription Changes:   Exercise Prescription Changes     Row Name 05/17/22 1500             Response to Exercise   Blood Pressure (Admit) 142/60       Blood Pressure (Exercise) 172/74       Blood Pressure (Exit) 160/68       Heart Rate (Admit) 67 bpm       Heart Rate (Exercise) 96 bpm       Heart Rate (Exit) 70  bpm       Oxygen Saturation (Admit) 100 %       Oxygen Saturation (Exercise) 95 %       Rating of Perceived Exertion (Exercise) 12       Perceived Dyspnea (Exercise) 0       Symptoms none       Comments 6MWT Results                Exercise Comments:   Exercise Comments     Row Name 05/22/22 1336           Exercise Comments First full day of exercise!  Patient was oriented to gym and equipment including functions, settings, policies, and procedures.  Patient's individual exercise prescription and treatment plan were reviewed.  All starting workloads were established based on the results of the 6 minute walk test done at initial orientation visit.  The plan for exercise progression was also introduced and progression will be customized based on patient's performance and goals.                Exercise Goals and Review:   Exercise Goals     Row Name 05/17/22 1538             Exercise Goals   Increase Physical Activity Yes       Intervention Provide advice, education, support and counseling about physical activity/exercise needs.;Develop an individualized exercise prescription for aerobic and resistive training based on initial evaluation findings, risk stratification, comorbidities and participant's personal goals.  Expected Outcomes Short Term: Attend rehab on a regular basis to increase amount of physical activity.;Long Term: Add in home exercise to make exercise part of routine and to increase amount of physical activity.;Long Term: Exercising regularly at least 3-5 days a week.       Increase Strength and Stamina Yes       Intervention Develop an individualized exercise prescription for aerobic and resistive training based on initial evaluation findings, risk stratification, comorbidities and participant's personal goals.;Provide advice, education, support and counseling about physical activity/exercise needs.       Expected Outcomes Short Term: Perform resistance  training exercises routinely during rehab and add in resistance training at home;Long Term: Improve cardiorespiratory fitness, muscular endurance and strength as measured by increased METs and functional capacity (6MWT);Short Term: Increase workloads from initial exercise prescription for resistance, speed, and METs.       Able to understand and use rate of perceived exertion (RPE) scale Yes       Intervention Provide education and explanation on how to use RPE scale       Expected Outcomes Short Term: Able to use RPE daily in rehab to express subjective intensity level;Long Term:  Able to use RPE to guide intensity level when exercising independently       Able to understand and use Dyspnea scale Yes       Intervention Provide education and explanation on how to use Dyspnea scale       Expected Outcomes Short Term: Able to use Dyspnea scale daily in rehab to express subjective sense of shortness of breath during exertion;Long Term: Able to use Dyspnea scale to guide intensity level when exercising independently       Knowledge and understanding of Target Heart Rate Range (THRR) Yes       Intervention Provide education and explanation of THRR including how the numbers were predicted and where they are located for reference       Expected Outcomes Long Term: Able to use THRR to govern intensity when exercising independently;Short Term: Able to state/look up THRR;Short Term: Able to use daily as guideline for intensity in rehab       Able to check pulse independently Yes       Intervention Review the importance of being able to check your own pulse for safety during independent exercise;Provide education and demonstration on how to check pulse in carotid and radial arteries.       Expected Outcomes Short Term: Able to explain why pulse checking is important during independent exercise;Long Term: Able to check pulse independently and accurately       Understanding of Exercise Prescription Yes        Intervention Provide education, explanation, and written materials on patient's individual exercise prescription       Expected Outcomes Short Term: Able to explain program exercise prescription;Long Term: Able to explain home exercise prescription to exercise independently                Exercise Goals Re-Evaluation :  Exercise Goals Re-Evaluation     [place] Name 05/22/22 1337             Exercise Goal Re-Evaluation   Exercise Goals Review Increase Physical Activity;Able to understand and use rate of perceived exertion (RPE) scale;Knowledge and understanding of Target Heart Rate Range (THRR);Understanding of Exercise Prescription;Increase Strength and Stamina;Able to understand and use Dyspnea scale       Comments Reviewed RPE scale, THR and program prescription with pt  today.  Pt voiced understanding and was given a copy of goals to take home.       Expected Outcomes Short: Use RPE daily to regulate intensity.  Long: Follow program prescription in THR.                Discharge Exercise Prescription (Final Exercise Prescription Changes):  Exercise Prescription Changes - 05/17/22 1500       Response to Exercise   Blood Pressure (Admit) 142/60    Blood Pressure (Exercise) 172/74    Blood Pressure (Exit) 160/68    Heart Rate (Admit) 67 bpm    Heart Rate (Exercise) 96 bpm    Heart Rate (Exit) 70 bpm    Oxygen Saturation (Admit) 100 %    Oxygen Saturation (Exercise) 95 %    Rating of Perceived Exertion (Exercise) 12    Perceived Dyspnea (Exercise) 0    Symptoms none    Comments 6MWT Results             Nutrition:  Target Goals: Understanding of nutrition guidelines, daily intake of sodium <1563m, cholesterol <2015m calories 30% from fat and 7% or less from saturated fats, daily to have 5 or more servings of fruits and vegetables.  Education: All About Nutrition: -Group instruction provided by verbal, written material, interactive activities, discussions, models,  and posters to present general guidelines for heart healthy nutrition including fat, fiber, MyPlate, the role of sodium in heart healthy nutrition, utilization of the nutrition label, and utilization of this knowledge for meal planning. Follow up email sent as well. Written material given at graduation.   Biometrics:  Pre Biometrics - 05/17/22 1538       Pre Biometrics   Height 5' 8.5" (1.74 m)    Weight 152 lb (68.9 kg)    Waist Circumference 36 inches    Hip Circumference 36 inches    Waist to Hip Ratio 1 %    BMI (Calculated) 22.77    Single Leg Stand 1.6 seconds              Nutrition Therapy Plan and Nutrition Goals:  Nutrition Therapy & Goals - 05/17/22 1525       Intervention Plan   Intervention Prescribe, educate and counsel regarding individualized specific dietary modifications aiming towards targeted core components such as weight, hypertension, lipid management, diabetes, heart failure and other comorbidities.    Expected Outcomes Short Term Goal: Understand basic principles of dietary content, such as calories, fat, sodium, cholesterol and nutrients.;Short Term Goal: A plan has been developed with personal nutrition goals set during dietitian appointment.;Long Term Goal: Adherence to prescribed nutrition plan.             Nutrition Assessments:  MEDIFICTS Score Key: ?70 Need to make dietary changes  40-70 Heart Healthy Diet ? 40 Therapeutic Level Cholesterol Diet  [facility]  Picture Your Plate Total Score on Admission 56      Picture Your Plate Scores: <[id number] dietary pattern with much room for improvement. 41-50 Dietary pattern unlikely to meet recommendations for good health and room for improvement. 51-60 More healthful dietary pattern, with some room for improvement.  >60 Healthy dietary pattern, although there may be some specific behaviors that could be improved.     Nutrition Goals Re-Evaluation:   Nutrition Goals Discharge (Final Nutrition Goals Re-Evaluation):   Psychosocial: Target Goals: Acknowledge presence or absence of significant depression and/or stress, maximize coping skills, provide  positive support system. Participant is able to verbalize types and ability to use techniques and skills needed for reducing stress and depression.   Education: Stress, Anxiety, and Depression - Group verbal and visual presentation to define topics covered.  Reviews how body is impacted by stress, anxiety, and depression.  Also discusses healthy ways to reduce stress and to treat/manage anxiety and depression.  Written material given at graduation.   Education: Sleep Hygiene -Provides group verbal and written instruction about how sleep can affect your health.  Define sleep hygiene, discuss sleep cycles and impact of sleep habits. Review good sleep hygiene tips.    Initial Review & Psychosocial Screening:  Initial Psych Review & Screening - 05/11/22 1358       Initial Review   Current issues with Current Psychotropic Meds;Current Anxiety/Panic      Family Dynamics   Good Support System? Yes   wife,daughter in law,   all children   Comments would have anxiety      Barriers   Psychosocial barriers to participate in program There are no identifiable barriers or psychosocial needs.      Screening Interventions   Interventions Encouraged to exercise;To provide support and resources with identified psychosocial needs;Provide feedback about the scores to participant    Expected Outcomes Short Term goal: Utilizing psychosocial counselor, staff and physician to assist with identification of specific Stressors or current issues interfering with healing process. Setting desired goal for each stressor or current issue identified.;Long Term Goal: Stressors or current issues are controlled or eliminated.;Short Term goal: Identification and review with participant  of any Quality of Life or Depression concerns found by scoring the questionnaire.;Long Term goal: The participant improves quality of Life and PHQ9 Scores as seen by post scores and/or verbalization of changes             Quality of Life Scores:   Quality of Life - 05/17/22 1526       Quality of Life   Select Quality of Life      Quality of Life Scores   Health/Function Pre 17.93 %    Socioeconomic Pre 22.75 %    Psych/Spiritual Pre 24.21 %    Family Pre 24.9 %    GLOBAL Pre 21.2 %            Scores of 19 and below usually indicate a poorer quality of life in these areas.  A difference of  2-3 points is a clinically meaningful difference.  A difference of 2-3 points in the total score of the Quality of Life Index has been associated with significant improvement in overall quality of life, self-image, physical symptoms, and general health in studies assessing change in quality of life.  PHQ-9: Review Flowsheet       05/17/2022  Depression screen PHQ 2/9  Decreased Interest 0  Down, Depressed, Hopeless 0  PHQ - 2 Score 0  Altered sleeping 1  Tired, decreased energy 0  Change in appetite 0  Feeling bad or failure about yourself  0  Trouble concentrating 1  Moving slowly or fidgety/restless 0  Suicidal thoughts 0  PHQ-9 Score 2  Difficult doing work/chores Not difficult at all   Interpretation of Total Score  Total Score Depression Severity:  1-4 = Minimal depression, 5-9 = Mild depression, 10-14 = Moderate depression, 15-19 = Moderately severe depression, 20-27 = Severe depression   Psychosocial Evaluation and Intervention:  Psychosocial Evaluation - 05/11/22 1413       Psychosocial Evaluation &  Interventions   Interventions Encouraged to exercise with the program and follow exercise prescription    Comments Bessie has no barriers to attending the program. He lives with his wife,[name],of 30 years. She and their children are his support.  He does take Zoloft for  history of anxiety. He would worry about others and get stressed.  He does have some short term memory concerns;diagnosis of mild dementia. He is ready to start the program and build up strength and energy.    Expected Outcomes [facility] [place] is able to attend all scheduled sessions. He is able to progress with his exercise  He is able to reduce any anxieties he has. LTG Shahzaib continues his exercise progression and keep anxiety reduced    Continue Psychosocial Services  Follow up required by staff             Psychosocial Re-Evaluation:   Psychosocial Discharge (Final Psychosocial Re-Evaluation):   Vocational Rehabilitation: Provide vocational rehab assistance to qualifying candidates.   Vocational Rehab Evaluation & Intervention:  Vocational Rehab - 05/11/22 1406       Initial Vocational Rehab Evaluation & Intervention   Assessment shows need for Vocational Rehabilitation No      Vocational Rehab Re-Evaulation   Comments retired             Education: Education Goals: Education classes will be provided on a variety of topics geared toward better understanding of heart health and risk factor modification. Participant will state understanding/return demonstration of topics presented as noted by education test scores.  Learning Barriers/Preferences:  Learning Barriers/Preferences - 05/11/22 1404       Learning Barriers/Preferences   Learning Barriers Inability to learn new things   memory retention not good   Learning Preferences None             General Cardiac Education Topics:  AED/CPR: - Group verbal and written instruction with the use of models to demonstrate the basic use of the AED with the basic ABC's of resuscitation.   Anatomy and Cardiac Procedures: - Group verbal and visual presentation and models provide information about basic cardiac anatomy and function. Reviews the testing methods done to diagnose heart disease and the outcomes of the test  results. Describes the treatment choices: Medical Management, Angioplasty, or Coronary Bypass Surgery for treating various heart conditions including Myocardial Infarction, Angina, Valve Disease, and Cardiac Arrhythmias.  Written material given at graduation. Flowsheet Row Cardiac Rehab from 05/24/2022 in [facility] Cardiac and Pulmonary Rehab  Education need identified 05/17/22       Medication Safety: - Group verbal and visual instruction to review commonly prescribed medications for heart and lung disease. Reviews the medication, class of the drug, and side effects. Includes the steps to properly store meds and maintain the prescription regimen.  Written material given at graduation. Flowsheet Row Cardiac Rehab from 05/24/2022 in [facility] Cardiac and Pulmonary Rehab  Date 05/24/22  Educator MS  Instruction Review Code 1- Verbalizes Understanding       Intimacy: - Group verbal instruction through game format to discuss how heart and lung disease can affect sexual intimacy. Written material given at graduation..   Know Your Numbers and Heart Failure: - Group verbal and visual instruction to discuss disease risk factors for cardiac and pulmonary disease and treatment options.  Reviews associated critical values for Overweight/Obesity, Hypertension, Cholesterol, and Diabetes.  Discusses basics of heart failure: signs/symptoms and treatments.  Introduces Heart Failure Zone chart for action plan for heart failure.  Written  material given at graduation.   Infection Prevention: - Provides verbal and written material to individual with discussion of infection control including proper hand washing and proper equipment cleaning during exercise session. Flowsheet Row Cardiac Rehab from 05/24/2022 in [facility] Cardiac and Pulmonary Rehab  Date 05/17/22  Educator NT  Instruction Review Code 1- Verbalizes Understanding       Falls Prevention: - Provides verbal and written material to individual with discussion  of falls prevention and safety. Flowsheet Row Cardiac Rehab from 05/24/2022 in [facility] Cardiac and Pulmonary Rehab  Date 05/11/22  Educator SB  Instruction Review Code 1- Verbalizes Understanding       Other: -Provides group and verbal instruction on various topics (see comments)   Knowledge Questionnaire Score:  Knowledge Questionnaire Score - 05/17/22 1524       Knowledge Questionnaire Score   Pre Score 22/26             Core Components/Risk Factors/Patient Goals at Admission:  Personal Goals and Risk Factors at Admission - 05/17/22 1532       Core Components/Risk Factors/Patient Goals on Admission   Diabetes Yes    Intervention Provide education about signs/symptoms and action to take for hypo/hyperglycemia.;Provide education about proper nutrition, including hydration, and aerobic/resistive exercise prescription along with prescribed medications to achieve blood glucose in normal ranges: Fasting glucose 65-99 mg/dL    Expected Outcomes Short Term: Participant verbalizes understanding of the signs/symptoms and immediate care of hyper/hypoglycemia, proper foot care and importance of medication, aerobic/resistive exercise and nutrition plan for blood glucose control.;Long Term: Attainment of HbA1C < 7%.    Hypertension Yes    Intervention Provide education on lifestyle modifcations including regular physical activity/exercise, weight management, moderate sodium restriction and increased consumption of fresh fruit, vegetables, and low fat dairy, alcohol moderation, and smoking cessation.;Monitor prescription use compliance.    Expected Outcomes Short Term: Continued assessment and intervention until BP is < 140/75m HG in hypertensive participants. < 130/82mHG in hypertensive participants with diabetes, heart failure or chronic kidney disease.;Long Term: Maintenance of blood pressure at goal levels.    Lipids Yes    Intervention Provide education and support for participant on  nutrition & aerobic/resistive exercise along with prescribed medications to achieve LDL <7014mHDL >8m90m  Expected Outcomes Short Term: Participant states understanding of desired cholesterol values and is compliant with medications prescribed. Participant is following exercise prescription and nutrition guidelines.;Long Term: Cholesterol controlled with medications as prescribed, with individualized exercise RX and with personalized nutrition plan. Value goals: LDL < 70mg82mL > 40 mg.             Education:Diabetes - Individual verbal and written instruction to review signs/symptoms of diabetes, desired ranges of glucose level fasting, after meals and with exercise. Acknowledge that pre and post exercise glucose checks will be done for 3 sessions at entry of program.   Core Components/Risk Factors/Patient Goals Review:    Core Components/Risk Factors/Patient Goals at Discharge (Final Review):    ITP Comments:  ITP Comments     Row Name 05/11/22 1412 05/17/22 1523 05/22/22 1336 05/24/22 1524     ITP Comments Virtual orientation call completed today. he has an appointment on Date: [id number] EP eval and gym Orientation.  [facility] . Completed 6MWT and gym orientation. Initial ITP created and sent for review to Dr. Mark Emily Filbertical Director. First full day of exercise!  Patient was oriented to gym  and equipment including functions, settings, policies, and procedures.  Patient's individual exercise prescription and treatment plan were reviewed.  All starting workloads were established based on the results of the 6 minute walk test done at initial orientation visit.  The plan for exercise progression was also introduced and progression will be customized based on patient's performance and goals. 30 day review completed. ITP sent to Dr. Emily Filbert, Medical Director of Cardiac Rehab. Continue with ITP unless changes are made by  physician.   Pt is new to program.             Comments: 30 day review

## 2022-05-25 ENCOUNTER — Encounter: Payer: PPO | Admitting: *Deleted

## 2022-05-25 DIAGNOSIS — Z955 Presence of coronary angioplasty implant and graft: Secondary | ICD-10-CM

## 2022-05-25 DIAGNOSIS — I252 Old myocardial infarction: Secondary | ICD-10-CM | POA: Diagnosis not present

## 2022-05-25 DIAGNOSIS — I214 Non-ST elevation (NSTEMI) myocardial infarction: Secondary | ICD-10-CM

## 2022-05-25 LAB — GLUCOSE, CAPILLARY: Glucose-Capillary: 126 mg/dL — ABNORMAL HIGH (ref 70–99)

## 2022-05-25 NOTE — Progress Notes (Signed)
Daily Session Note  Patient Details  Name: Raymond Sampson MRN: [id number] Date of Birth: 06-Feb-1941 Referring Provider:   Flowsheet Row Cardiac Rehab from 05/17/2022 in [facility] Cardiac and Pulmonary Rehab  Referring Provider Dr Nelva Bush, MD       Encounter Date: 05/25/2022  Check In:  Session Check In - 05/25/22 1341       Check-In   Supervising physician immediately available to respond to emergencies See telemetry face sheet for immediately available ER MD    Location [facility]-Cardiac & Pulmonary Rehab    Staff Present Darlyne Russian, RN, ADN;Jessica Luan Pulling, MA, RCEP, CCRP, CCET;Joseph [place], [place]    Virtual Visit No    Medication changes reported     No    Fall or balance concerns reported    No    Warm-up and Cool-down Performed on first and last piece of equipment    Resistance Training Performed Yes    VAD Patient? No    PAD/SET Patient? No      Pain Assessment   Currently in Pain? No/denies                Social History   Tobacco Use  Smoking Status Former   Packs/day: 1.00   Years: 25.00   Total pack years: 25.00   Types: Cigarettes   Quit date: 04/17/1985   Years since quitting: 37.1  Smokeless Tobacco Never    Goals Met:  Independence with exercise equipment Exercise tolerated well No report of concerns or symptoms today Strength training completed today  Goals Unmet:  Not Applicable  Comments: Pt able to follow exercise prescription today without complaint.  Will continue to monitor for progression.    Dr. Emily Filbert is Medical Director for [place].  Dr. Ottie Glazier is Medical Director for [facility] Pulmonary Rehabilitation.

## 2022-05-29 ENCOUNTER — Encounter: Payer: PPO | Admitting: *Deleted

## 2022-05-29 DIAGNOSIS — I214 Non-ST elevation (NSTEMI) myocardial infarction: Secondary | ICD-10-CM

## 2022-05-29 DIAGNOSIS — Z955 Presence of coronary angioplasty implant and graft: Secondary | ICD-10-CM

## 2022-05-29 DIAGNOSIS — I252 Old myocardial infarction: Secondary | ICD-10-CM | POA: Diagnosis not present

## 2022-05-29 NOTE — Progress Notes (Signed)
Daily Session Note  Patient Details  Name: Raymond Sampson MRN: [id number] Date of Birth: May 12, 1940 Referring Provider:   Flowsheet Row Cardiac Rehab from 05/17/2022 in [facility] Cardiac and Pulmonary Rehab  Referring Provider Dr Nelva Bush, MD       Encounter Date: 05/29/2022  Check In:  Session Check In - 05/29/22 1329       Check-In   Supervising physician immediately available to respond to emergencies See telemetry face sheet for immediately available ER MD    Location [facility]Cardiac [facility] Pulmonary Rehab    Staff Present Renita Papa, RN BSN;Joseph Tessie Fass, Ernestina Patches, RN, [place]    Virtual Visit No    Medication changes reported     No    Fall or balance concerns reported    No    Warm-up and Cool-down Performed on first and last piece of equipment    Resistance Training Performed Yes    VAD Patient? No    PAD/SET Patient? No      Pain Assessment   Currently in Pain? No/denies                Social History   Tobacco Use  Smoking Status Former   Packs/day: 1.00   Years: 25.00   Total pack years: 25.00   Types: Cigarettes   Quit date: 04/17/1985   Years since quitting: 37.1  Smokeless Tobacco Never    Goals Met:  Independence with exercise equipment Exercise tolerated well No report of concerns or symptoms today Strength training completed today  Goals Unmet:  Not Applicable  Comments: Pt able to follow exercise prescription today without complaint.  Will continue to monitor for progression.    Dr. Emily Filbert is Medical Director for [place].  Dr. Ottie Glazier is Medical Director for [facility] Pulmonary Rehabilitation.

## 2022-05-31 ENCOUNTER — Encounter: Payer: PPO | Admitting: *Deleted

## 2022-05-31 DIAGNOSIS — I252 Old myocardial infarction: Secondary | ICD-10-CM | POA: Diagnosis not present

## 2022-05-31 DIAGNOSIS — I214 Non-ST elevation (NSTEMI) myocardial infarction: Secondary | ICD-10-CM

## 2022-05-31 DIAGNOSIS — Z955 Presence of coronary angioplasty implant and graft: Secondary | ICD-10-CM

## 2022-05-31 NOTE — Progress Notes (Signed)
Daily Session Note  Patient Details  Name: Raymond Sampson MRN: [id number] Date of Birth: November 08, 1940 Referring Provider:   Flowsheet Row Cardiac Rehab from 05/17/2022 in [facility] Cardiac and Pulmonary Rehab  Referring Provider Dr Nelva Bush, MD       Encounter Date: 05/31/2022  Check In:  Session Check In - 05/31/22 1341       Check-In   Supervising physician immediately available to respond to emergencies See telemetry face sheet for immediately available ER MD    Location [facility]Cardiac [facility] Pulmonary Rehab    Staff Present Darlyne Russian, RN, ADN;Jessica Luan Pulling, MA, RCEP, CCRP, [place], BS, ACSM CEP, Exercise Physiologist;Kara Maricela Bo, MS, ACSM CEP, Exercise Physiologist    Virtual Visit No    Medication changes reported     No    Fall or balance concerns reported    No    Warm-up and Cool-down Performed on first and last piece of equipment    Resistance Training Performed Yes    VAD Patient? No    PAD/SET Patient? No      Pain Assessment   Currently in Pain? No/denies                Social History   Tobacco Use  Smoking Status Former   Packs/day: 1.00   Years: 25.00   Total pack years: 25.00   Types: Cigarettes   Quit date: 04/17/1985   Years since quitting: 37.1  Smokeless Tobacco Never    Goals Met:  Independence with exercise equipment Exercise tolerated well No report of concerns or symptoms today Strength training completed today  Goals Unmet:  Not Applicable  Comments: Pt able to follow exercise prescription today without complaint.  Will continue to monitor for progression.    Dr. Emily Filbert is Medical Director for [place].  Dr. Ottie Glazier is Medical Director for [facility] Pulmonary Rehabilitation.

## 2022-06-01 ENCOUNTER — Encounter: Payer: PPO | Admitting: *Deleted

## 2022-06-01 DIAGNOSIS — I252 Old myocardial infarction: Secondary | ICD-10-CM | POA: Diagnosis not present

## 2022-06-01 DIAGNOSIS — Z955 Presence of coronary angioplasty implant and graft: Secondary | ICD-10-CM

## 2022-06-01 DIAGNOSIS — I214 Non-ST elevation (NSTEMI) myocardial infarction: Secondary | ICD-10-CM

## 2022-06-01 NOTE — Progress Notes (Signed)
Daily Session Note  Patient Details  Name: Raymond Sampson MRN: [id number] Date of Birth: 31-Oct-1940 Referring Provider:   Flowsheet Row Cardiac Rehab from 05/17/2022 in [facility] Cardiac and Pulmonary Rehab  Referring Provider Dr Nelva Bush, MD       Encounter Date: 06/01/2022  Check In:  Session Check In - 06/01/22 1341       Check-In   Supervising physician immediately available to respond to emergencies See telemetry face sheet for immediately available ER MD    Location [facility]Cardiac [facility] Pulmonary Re[facility]    Staff Present Darlyne Russian, RN, ADN;Jessica Luan Pulling, MA, RCEP, CCRP, Bertram Gala, MS, ACSM CEP, Exercise Physiologist;Joseph Tessie Fass, [place]    Virtual Visit No    Medication changes reported     No    Fall or balance concerns reported    No    Warm-up and Cool-down Performed on first and last piece of equipment    Resistance Training Performed Yes    VAD Patient? No    PAD/SET Patient? No      Pain Assessment   Currently in Pain? No/denies                Social History   Tobacco Use  Smoking Status Former   Packs/day: 1.00   Years: 25.00   Total pack years: 25.00   Types: Cigarettes   Quit date: 04/17/1985   Years since quitting: 37.1  Smokeless Tobacco Never    Goals Met:  Independence with exercise equipment Exercise tolerated well No report of concerns or symptoms today Strength training completed today  Goals Unmet:  Not Applicable  Comments: Pt able to follow exercise prescription today without complaint.  Will continue to monitor for progression.    Dr. Emily Filbert is Medical Director for [place].  Dr. Ottie Glazier is Medical Director for [facility] Pulmonary Rehabilitation.

## 2022-06-05 ENCOUNTER — Encounter: Payer: PPO | Attending: Internal Medicine | Admitting: *Deleted

## 2022-06-05 DIAGNOSIS — I252 Old myocardial infarction: Secondary | ICD-10-CM | POA: Diagnosis not present

## 2022-06-05 DIAGNOSIS — Z955 Presence of coronary angioplasty implant and graft: Secondary | ICD-10-CM | POA: Diagnosis present

## 2022-06-05 DIAGNOSIS — Z48812 Encounter for surgical aftercare following surgery on the circulatory system: Secondary | ICD-10-CM | POA: Insufficient documentation

## 2022-06-05 DIAGNOSIS — I214 Non-ST elevation (NSTEMI) myocardial infarction: Secondary | ICD-10-CM

## 2022-06-05 NOTE — Progress Notes (Signed)
Daily Session Note  Patient Details  Name: Raymond Sampson MRN: [id number] Date of Birth: [birth year] Referring Provider:   Flowsheet Row Cardiac Rehab from 05/17/2022 in [facility] Cardiac and Pulmonary Rehab  Referring Provider Dr Nelva Bush, MD       Encounter Date: 06/05/2022  Check In:  Session Check In - 06/05/22 1328       Check-In   Supervising physician immediately available to respond to emergencies See telemetry face sheet for immediately available ER MD    Location [facility]Card[facility] & Pulmonary Rehab    Staff Present Renita Papa, RN BSN;Joseph Tessie Fass, Ernestina Patches, RN, [place]    Virtual Visit No    Medication changes reported     No    Fall or balance concerns reported    No    Warm-up and Cool-down Performed on first and last piece of equipment    Resistance Training Performed Yes    VAD Patient? No    PAD/SET Patient? No      Pain Assessment   Currently in Pain? No/denies                Social History   Tobacco Use  Smoking Status Former   Packs/day: 1.00   Years: 25.00   Total pack years: 25.00   Types: Cigarettes   Quit date: 04/17/1985   Years since quitting: 37.1  Smokeless Tobacco Never    Goals Met:  Independence with exercise equipment Exercise tolerated well No report of concerns or symptoms today Strength training completed today  Goals Unmet:  Not Applicable  Comments: Pt able to follow exercise prescription today without complaint.  Will continue to monitor for progression.    Dr. Emily Filbert is Medical Director for [place].  Dr. Ottie Glazier is Medical Director for [facility] Pulmonary Rehabilitation.

## 2022-06-07 ENCOUNTER — Encounter: Payer: PPO | Admitting: *Deleted

## 2022-06-07 DIAGNOSIS — I214 Non-ST elevation (NSTEMI) myocardial infarction: Secondary | ICD-10-CM

## 2022-06-07 DIAGNOSIS — Z955 Presence of coronary angioplasty implant and graft: Secondary | ICD-10-CM

## 2022-06-07 DIAGNOSIS — Z48812 Encounter for surgical aftercare following surgery on the circulatory system: Secondary | ICD-10-CM | POA: Diagnosis not present

## 2022-06-07 NOTE — Progress Notes (Signed)
Daily Session Note  Patient Details  Name: WEYLYN VERCRUYSSE MRN: [id number] Date of Birth: 1941/01/13 Referring Provider:   Flowsheet Row Cardiac Rehab from 05/17/2022 in [facility] Cardiac and Pulmonary Rehab  Referring Provider Dr Nelva Bush, MD       Encounter Date: 06/07/2022  Check In:  Session Check In - 06/07/22 1351       Check-In   Supervising physician immediately available to respond to emergencies See telemetry face sheet for immediately available ER MD    Location [facility]-Cardiac & Pulmonary Rehab    Staff Present Renita Papa, RN Moises Blood, BS, ACSM CEP, Exercise Physiologist;Megan Tamala Julian, RN, ADN    Virtual Visit No    Medication changes reported     No    Fall or balance concerns reported    No    Warm-up and Cool-down Performed on first and last piece of equipment    Resistance Training Performed Yes    VAD Patient? No    PAD/SET Patient? No      Pain Assessment   Currently in Pain? No/denies                Social History   Tobacco Use  Smoking Status Former   Packs/day: 1.00   Years: 25.00   Total pack years: 25.00   Types: Cigarettes   Quit date: 04/17/1985   Years since quitting: 37.1  Smokeless Tobacco Never    Goals Met:  Independence with exercise equipment Exercise tolerated well No report of concerns or symptoms today Strength training completed today  Goals Unmet:  Not Applicable  Comments: Pt able to follow exercise prescription today without complaint.  Will continue to monitor for progression.    Dr. Emily Filbert is Medical Director for [place].  Dr. Ottie Glazier is Medical Director for [facility] Pulmonary Rehabilitation.

## 2022-06-08 ENCOUNTER — Encounter: Payer: PPO | Admitting: *Deleted

## 2022-06-08 DIAGNOSIS — Z955 Presence of coronary angioplasty implant and graft: Secondary | ICD-10-CM

## 2022-06-08 DIAGNOSIS — Z48812 Encounter for surgical aftercare following surgery on the circulatory system: Secondary | ICD-10-CM | POA: Diagnosis not present

## 2022-06-08 DIAGNOSIS — I214 Non-ST elevation (NSTEMI) myocardial infarction: Secondary | ICD-10-CM

## 2022-06-08 NOTE — Progress Notes (Signed)
Daily Session Note  Patient Details  Name: Raymond Sampson MRN: [id number] Date of Birth: [birth year] Referring Provider:   Flowsheet Row Cardiac Rehab from 05/17/2022 in [facility] Cardiac and Pulmonary Rehab  Referring Provider Dr Nelva Bush, MD       Encounter Date: 06/08/2022  Check In:  Session Check In - 06/08/22 1334       Check-In   Supervising physician immediately available to respond to emergencies See telemetry face sheet for immediately available ER MD    Location [facility]Cardiac & Pulmonary Rehab    Staff Present Darlyne Russian, RN, ADN;Meredith Sherryll Burger, RN Abel Presto, MS, ACSM CEP, Exercise Physiologist;Noah Tickle, BS, Exercise Physiologist    Virtual Visit No    Medication changes reported     No    Fall or balance concerns reported    No    Warm-up and Cool-down Performed on first and last piece of equipment    Resistance Training Performed Yes    VAD Patient? No    PAD/SET Patient? No      Pain Assessment   Currently in Pain? No/denies                Social History   Tobacco Use  Smoking Status Former   Packs/day: 1.00   Years: 25.00   Total pack years: 25.00   Types: Cigarettes   Quit date: 04/17/1985   Years since quitting: 37.1  Smokeless Tobacco Never    Goals Met:  Independence with exercise equipment Exercise tolerated well No report of concerns or symptoms today Strength training completed today  Goals Unmet:  Not Applicable  Comments: Pt able to follow exercise prescription today without complaint.  Will continue to monitor for progression.    Dr. Emily Filbert is Medical Director for [place].  Dr. Ottie Glazier is Medical Director for [facility] Pulmonary Rehabilitation.

## 2022-06-12 ENCOUNTER — Encounter: Payer: PPO | Admitting: *Deleted

## 2022-06-12 DIAGNOSIS — I214 Non-ST elevation (NSTEMI) myocardial infarction: Secondary | ICD-10-CM

## 2022-06-12 DIAGNOSIS — Z48812 Encounter for surgical aftercare following surgery on the circulatory system: Secondary | ICD-10-CM | POA: Diagnosis not present

## 2022-06-12 DIAGNOSIS — Z955 Presence of coronary angioplasty implant and graft: Secondary | ICD-10-CM

## 2022-06-12 NOTE — Progress Notes (Signed)
Daily Session Note  Patient Details  Name: Raymond Sampson MRN: [id number] Date of Birth: [birth year] Referring Provider:   Flowsheet Row Cardiac Rehab from 05/17/2022 in [facility] Cardiac and Pulmonary Rehab  Referring Provider Dr Nelva Bush, MD       Encounter Date: 06/12/2022  Check In:  Session Check In - 06/12/22 1325       Check-In   Supervising physician immediately available to respond to emergencies See telemetry face sheet for immediately available ER MD    Location [facility]-Cardiac & Pulmonary Rehab    Staff Present Renita Papa, RN BSN;Megan Tamala Julian, RN, Terie Purser, RCP,RRT,BSRT    Virtual Visit No    Medication changes reported     No    Fall or balance concerns reported    No    Warm-up and Cool-down Performed on first and last piece of equipment    Resistance Training Performed Yes    VAD Patient? No    PAD/SET Patient? No      Pain Assessment   Currently in Pain? No/denies                Social History   Tobacco Use  Smoking Status Former   Packs/day: 1.00   Years: 25.00   Total pack years: 25.00   Types: Cigarettes   Quit date: 04/17/1985   Years since quitting: 37.1  Smokeless Tobacco Never    Goals Met:  Independence with exercise equipment Exercise tolerated well No report of concerns or symptoms today Strength training completed today  Goals Unmet:  Not Applicable  Comments: Pt able to follow exercise prescription today without complaint.  Will continue to monitor for progression.    Dr. Emily Filbert is Medical Director for [place].  Dr. Ottie Glazier is Medical Director for [facility] Pulmonary Rehabilitation.

## 2022-06-13 ENCOUNTER — Other Ambulatory Visit: Payer: Self-pay | Admitting: Physician Assistant

## 2022-06-13 DIAGNOSIS — I739 Peripheral vascular disease, unspecified: Secondary | ICD-10-CM

## 2022-06-13 DIAGNOSIS — I2510 Atherosclerotic heart disease of native coronary artery without angina pectoris: Secondary | ICD-10-CM

## 2022-06-13 DIAGNOSIS — I10 Essential (primary) hypertension: Secondary | ICD-10-CM

## 2022-06-13 DIAGNOSIS — R82998 Other abnormal findings in urine: Secondary | ICD-10-CM

## 2022-06-13 DIAGNOSIS — I4719 Other supraventricular tachycardia: Secondary | ICD-10-CM

## 2022-06-13 DIAGNOSIS — Z8673 Personal history of transient ischemic attack (TIA), and cerebral infarction without residual deficits: Secondary | ICD-10-CM

## 2022-06-13 DIAGNOSIS — I6523 Occlusion and stenosis of bilateral carotid arteries: Secondary | ICD-10-CM

## 2022-06-13 DIAGNOSIS — E785 Hyperlipidemia, unspecified: Secondary | ICD-10-CM

## 2022-06-13 DIAGNOSIS — R911 Solitary pulmonary nodule: Secondary | ICD-10-CM

## 2022-06-14 ENCOUNTER — Encounter: Payer: PPO | Admitting: *Deleted

## 2022-06-14 DIAGNOSIS — Z955 Presence of coronary angioplasty implant and graft: Secondary | ICD-10-CM

## 2022-06-14 DIAGNOSIS — I214 Non-ST elevation (NSTEMI) myocardial infarction: Secondary | ICD-10-CM

## 2022-06-14 DIAGNOSIS — Z48812 Encounter for surgical aftercare following surgery on the circulatory system: Secondary | ICD-10-CM | POA: Diagnosis not present

## 2022-06-14 NOTE — Progress Notes (Signed)
Daily Session Note  Patient Details  Name: Raymond Sampson MRN: [id number] Date of Birth: Apr 11, 1940 Referring Provider:   Flowsheet Row Cardiac Rehab from 05/17/2022 in [facility] Cardiac and Pulmonary Rehab  Referring Provider Dr Nelva Bush, MD       Encounter Date: 06/14/2022  Check In:  Session Check In - 06/14/22 1336       Check-In   Supervising physician immediately available to respond to emergencies See telemetry face sheet for immediately available ER MD    Location [facility]-Cardiac & Pulmonary Rehab    Staff Present Renita Papa, RN BSN;Megan Tamala Julian, RN, Terie Purser, RCP,RRT,BSRT    Virtual Visit No    Medication changes reported     No    Fall or balance concerns reported    No    Warm-up and Cool-down Performed on first and last piece of equipment    Resistance Training Performed Yes    VAD Patient? No    PAD/SET Patient? No      Pain Assessment   Currently in Pain? No/denies                Social History   Tobacco Use  Smoking Status Former   Packs/day: 1.00   Years: 25.00   Total pack years: 25.00   Types: Cigarettes   Quit date: 04/17/1985   Years since quitting: 37.1  Smokeless Tobacco Never    Goals Met:  Independence with exercise equipment Exercise tolerated well No report of concerns or symptoms today Strength training completed today  Goals Unmet:  Not Applicable  Comments: Pt able to follow exercise prescription today without complaint.  Will continue to monitor for progression.  Reviewed home exercise with pt today.  Pt plans to walk around yard at home for exercise.  Reviewed THR, pulse, RPE, sign and symptoms, pulse oximetery and when to call 911 or MD.  Also discussed weather considerations and indoor options.  Pt voiced understanding.   Dr. Emily Filbert is Medical Director for [place].  Dr. Ottie Glazier is Medical Director for [facility] Pulmonary Rehabilitation.

## 2022-06-15 ENCOUNTER — Ambulatory Visit (INDEPENDENT_AMBULATORY_CARE_PROVIDER_SITE_OTHER): Payer: PPO

## 2022-06-15 ENCOUNTER — Other Ambulatory Visit: Payer: Self-pay | Admitting: Cardiovascular Disease

## 2022-06-15 ENCOUNTER — Ambulatory Visit: Payer: PPO | Attending: Physician Assistant

## 2022-06-15 DIAGNOSIS — I739 Peripheral vascular disease, unspecified: Secondary | ICD-10-CM

## 2022-06-15 DIAGNOSIS — I6523 Occlusion and stenosis of bilateral carotid arteries: Secondary | ICD-10-CM

## 2022-06-15 LAB — VAS US ABI WITH/WO TBI
Left ABI: 0.65
Right ABI: 0.95

## 2022-06-17 NOTE — Progress Notes (Deleted)
Cardiology Office Note  Date:  06/17/2022   ID:  Raymond Sampson, DOB 10/17/1940, MRN [id number]  PCP:  [name], MD   No chief complaint on file.   HPI:  82 yo male with history of  CAD  apical MI in 5/07 with 3 stents placed in the LAD.   h/o CVA and TIA  07/2010, bilateral carotid disease, right side, 50%, 60% on the left, 05/2016 Prior history of smoking for 30 years, stopped smoking 25 years ago Dementia Who presents for routine follow-up of his coronary artery disease  Seen by myself in clinic 2/23 Seen by one of our providers February 2024    Last carotid ultrasound 2020 1 to 39% bilateral internal carotid artery stenosis   Prior stress testing 2018  Eating wrong foods,  A1C >8 Long discussion concerning his high carbohydrates, breads  Wife concerned about his lack of activity, leg weakness Mild dementia, symptoms have been stable per the wife No recent falls, uses a cane sometimes  Previously quit amlodipine on his own, Also quit gabapentin   followed by neurology, [name]  stroke history 2012, etiology unclear.   EKG personally reviewed by myself on todays visit Shows normal sinus rhythm rate 66 bpm, T-wave changes V3 to V4  Other past medical history stress test 12/08  showed a partially reversible inferoapical defect similar to prior stress in 12/07.  Echo in 3/11 showed preserved EF.   PMH:   has a past medical history of Arthritic-like pain, CAD (coronary artery disease), Carotid arterial disease ([name]), Chest pain (04/13/2022), CVA (cerebral vascular accident) ([name]), Diet-controlled type 2 diabetes mellitus ([name]), HLD (hyperlipidemia), HTN (hypertension), and PAT (paroxysmal atrial tachycardia).  PSH:    Past Surgical History:  Procedure Laterality Date   BACK SURGERY     CARDIAC CATHETERIZATION     CORONARY ANGIOPLASTY WITH STENT PLACEMENT     apical MI 5/07 with LHC showing 50% pCFX, 30% pRCA, 99% pLAD, 9%% mLAD, 75% dLAD. cypher DES x3  to LAD. ETT-myoview (12/08) 81% MPHR, 8'1, EF 64%, partially reversible inferoapical perfusion defect similar to prior study 12/07   CORONARY STENT INTERVENTION N/A 04/14/2022   Procedure: CORONARY STENT INTERVENTION;  Surgeon: Nelva Bush, MD;  Location: [place] [facility];  Service: Cardiovascular;  Laterality: N/A;   CORONARY STENT INTERVENTION Left 05/02/2022   Procedure: CORONARY STENT INTERVENTION;  Surgeon: Nelva Bush, MD;  Location: [place] [facility];  Service: Cardiovascular;  Laterality: Left;   INTRAVASCULAR IMAGING/OCT N/A 04/14/2022   Procedure: INTRAVASCULAR IMAGING/OCT;  Surgeon: Nelva Bush, MD;  Location: [place] [facility];  Service: Cardiovascular;  Laterality: N/A;   LEFT HEART CATH AND CORONARY ANGIOGRAPHY N/A 04/13/2022   Procedure: LEFT HEART CATH AND CORONARY ANGIOGRAPHY;  Surgeon: [name], MD;  Location: [place] [facility];  Service: Cardiovascular;  Laterality: N/A;    Current Outpatient Medications  Medication Sig Dispense Refill   aspirin 81 MG tablet Take 81 mg by mouth daily.     atorvastatin (LIPITOR) 80 MG tablet Take 1 tablet (80 mg total) by mouth daily. 30 tablet 2   clopidogrel (PLAVIX) 75 MG tablet TAKE 1 TABLET BY MOUTH EVERY DAY WITH BREAKFAST 90 tablet 0   cyanocobalamin 1000 MCG tablet Take 1,000 mcg by mouth daily.     lisinopril (ZESTRIL) 20 MG tablet TAKE 1 TABLET BY MOUTH EVERY DAY 90 tablet 2   metFORMIN (GLUCOPHAGE) 1000 MG tablet Take 1 tablet (1,000 mg total) by mouth 2 (  two) times daily with a meal. Resume 05/05/2022     metoprolol tartrate (LOPRESSOR) 25 MG tablet TAKE 1 TABLET BY MOUTH TWICE A DAY 180 tablet 2   pantoprazole (PROTONIX) 20 MG tablet Take 1 tablet (20 mg total) by mouth daily. 30 tablet 2   sertraline (ZOLOFT) 25 MG tablet Take 1 tablet (25 mg total) by mouth daily.     sitaGLIPtin (JANUVIA) 25 MG tablet Take 1 tablet by mouth daily.     No current facility-administered medications for this visit.     Allergies:   Metformin and related and Simvastatin   Social History:  The patient  reports that he quit smoking about 37 years ago. His smoking use included cigarettes. He has a 25.00 pack-year smoking history. He has never used smokeless tobacco. He reports that he does not drink alcohol and does not use drugs.   Family History:   Family history is unknown by patient.    Review of Systems: Review of Systems  Constitutional: Negative.   Respiratory: Negative.    Cardiovascular: Negative.   Gastrointestinal: Negative.   Musculoskeletal: Negative.   Neurological: Negative.   Psychiatric/Behavioral: Negative.    All other systems reviewed and are negative.    PHYSICAL EXAM: VS:  There were no vitals taken for this visit. , BMI There is no height or weight on file to calculate BMI. Constitutional:  oriented to person, place, and time. No distress.  HENT:  Head: Grossly normal Eyes:  no discharge. No scleral icterus.  Neck: No JVD, no carotid bruits  Cardiovascular: Regular rate and rhythm, no murmurs appreciated Pulmonary/Chest: Clear to auscultation bilaterally, no wheezes or rails Abdominal: Soft.  no distension.  no tenderness.  Musculoskeletal: Normal range of motion Neurological:  normal muscle tone. Coordination normal. No atrophy Skin: Skin warm and dry Psychiatric: normal affect, pleasant   Recent Labs: 04/12/2022: ALT 17 04/14/2022: Magnesium 1.9 05/03/2022: BUN 15; Creatinine, Ser 0.97; Platelets 301; Potassium 3.7; Sodium 137 05/05/2022: Hemoglobin 10.9    Lipid Panel Lab Results  Component Value Date   CHOL 205 (H) 04/13/2022   HDL 40 (L) 04/13/2022   LDLCALC 114 (H) 04/13/2022   TRIG 257 (H) 04/13/2022      Wt Readings from Last 3 Encounters:  05/17/22 152 lb (68.9 kg)  05/10/22 152 lb 12.8 oz (69.3 kg)  05/02/22 153 lb (69.4 kg)     ASSESSMENT AND PLAN:  Atherosclerosis of native coronary artery of native heart without angina pectoris -   Currently with no symptoms of angina. No further workup at this time. Continue current medication regimen. Low risk scan January 2018 No ischemia  Atrial flutter, unspecified type ([place]) -  Maintaining normal sinus rhythm Prior event monitor Denies symptoms of tachypalpitations, no changes to his medications  Stenosis of carotid artery, unspecified laterality - Stable disease mild to moderate Stressed importance of diabetes control, cholesterol at goal  Palpitations - No recent symptoms  Essential hypertension Blood pressure high and what is range,  Recommend he monitor blood pressures at home   Total encounter time more than 30 minutes  Greater than 50% was spent in counseling and coordination of care with the patient    No orders of the defined types were placed in this encounter.    Signed, Esmond Plants, M.D., Ph.D. 06/17/2022  [place], [place]

## 2022-06-19 ENCOUNTER — Emergency Department: Payer: PPO

## 2022-06-19 ENCOUNTER — Ambulatory Visit: Payer: PPO | Admitting: Cardiovascular Disease

## 2022-06-19 ENCOUNTER — Inpatient Hospital Stay
Admission: EM | Admit: 2022-06-19 | Discharge: 2022-07-03 | DRG: 199 | Disposition: A | Discharge status: Skilled Nursing Facility | Payer: PPO | Attending: Internal Medicine | Admitting: Internal Medicine

## 2022-06-19 ENCOUNTER — Other Ambulatory Visit: Payer: Self-pay

## 2022-06-19 DIAGNOSIS — W19XXXA Unspecified fall, initial encounter: Secondary | ICD-10-CM

## 2022-06-19 DIAGNOSIS — Z8673 Personal history of transient ischemic attack (TIA), and cerebral infarction without residual deficits: Secondary | ICD-10-CM

## 2022-06-19 DIAGNOSIS — I739 Peripheral vascular disease, unspecified: Secondary | ICD-10-CM

## 2022-06-19 DIAGNOSIS — I10 Essential (primary) hypertension: Secondary | ICD-10-CM

## 2022-06-19 DIAGNOSIS — W182XXA Fall in (into) shower or empty bathtub, initial encounter: Secondary | ICD-10-CM | POA: Diagnosis present

## 2022-06-19 DIAGNOSIS — Z87891 Personal history of nicotine dependence: Secondary | ICD-10-CM

## 2022-06-19 DIAGNOSIS — I4719 Other supraventricular tachycardia: Secondary | ICD-10-CM | POA: Diagnosis present

## 2022-06-19 DIAGNOSIS — J942 Hemothorax: Secondary | ICD-10-CM | POA: Diagnosis not present

## 2022-06-19 DIAGNOSIS — I2489 Other forms of acute ischemic heart disease: Secondary | ICD-10-CM | POA: Diagnosis present

## 2022-06-19 DIAGNOSIS — I25118 Atherosclerotic heart disease of native coronary artery with other forms of angina pectoris: Secondary | ICD-10-CM | POA: Diagnosis present

## 2022-06-19 DIAGNOSIS — I2510 Atherosclerotic heart disease of native coronary artery without angina pectoris: Secondary | ICD-10-CM | POA: Diagnosis present

## 2022-06-19 DIAGNOSIS — Z9861 Coronary angioplasty status: Secondary | ICD-10-CM

## 2022-06-19 DIAGNOSIS — Z66 Do not resuscitate: Secondary | ICD-10-CM | POA: Diagnosis present

## 2022-06-19 DIAGNOSIS — J9811 Atelectasis: Secondary | ICD-10-CM | POA: Diagnosis present

## 2022-06-19 DIAGNOSIS — I259 Chronic ischemic heart disease, unspecified: Secondary | ICD-10-CM | POA: Diagnosis present

## 2022-06-19 DIAGNOSIS — E785 Hyperlipidemia, unspecified: Secondary | ICD-10-CM

## 2022-06-19 DIAGNOSIS — Z79899 Other long term (current) drug therapy: Secondary | ICD-10-CM

## 2022-06-19 DIAGNOSIS — T148XXA Other injury of unspecified body region, initial encounter: Secondary | ICD-10-CM | POA: Diagnosis present

## 2022-06-19 DIAGNOSIS — E440 Moderate protein-calorie malnutrition: Secondary | ICD-10-CM | POA: Insufficient documentation

## 2022-06-19 DIAGNOSIS — Z7984 Long term (current) use of oral hypoglycemic drugs: Secondary | ICD-10-CM

## 2022-06-19 DIAGNOSIS — S2241XA Multiple fractures of ribs, right side, initial encounter for closed fracture: Secondary | ICD-10-CM | POA: Diagnosis present

## 2022-06-19 DIAGNOSIS — F0390 Unspecified dementia without behavioral disturbance: Secondary | ICD-10-CM | POA: Diagnosis present

## 2022-06-19 DIAGNOSIS — S272XXA Traumatic hemopneumothorax, initial encounter: Secondary | ICD-10-CM | POA: Diagnosis not present

## 2022-06-19 DIAGNOSIS — Z955 Presence of coronary angioplasty implant and graft: Secondary | ICD-10-CM

## 2022-06-19 DIAGNOSIS — Z8616 Personal history of COVID-19: Secondary | ICD-10-CM

## 2022-06-19 DIAGNOSIS — I6523 Occlusion and stenosis of bilateral carotid arteries: Secondary | ICD-10-CM

## 2022-06-19 DIAGNOSIS — E118 Type 2 diabetes mellitus with unspecified complications: Secondary | ICD-10-CM | POA: Diagnosis present

## 2022-06-19 DIAGNOSIS — Y93E1 Activity, personal bathing and showering: Secondary | ICD-10-CM

## 2022-06-19 DIAGNOSIS — J939 Pneumothorax, unspecified: Secondary | ICD-10-CM

## 2022-06-19 DIAGNOSIS — Z7982 Long term (current) use of aspirin: Secondary | ICD-10-CM

## 2022-06-19 DIAGNOSIS — E1151 Type 2 diabetes mellitus with diabetic peripheral angiopathy without gangrene: Secondary | ICD-10-CM | POA: Diagnosis present

## 2022-06-19 DIAGNOSIS — D72829 Elevated white blood cell count, unspecified: Secondary | ICD-10-CM | POA: Insufficient documentation

## 2022-06-19 DIAGNOSIS — S2249XA Multiple fractures of ribs, unspecified side, initial encounter for closed fracture: Secondary | ICD-10-CM | POA: Diagnosis present

## 2022-06-19 DIAGNOSIS — I110 Hypertensive heart disease with heart failure: Secondary | ICD-10-CM | POA: Diagnosis present

## 2022-06-19 DIAGNOSIS — E119 Type 2 diabetes mellitus without complications: Secondary | ICD-10-CM | POA: Diagnosis present

## 2022-06-19 DIAGNOSIS — Z6822 Body mass index (BMI) 22.0-22.9, adult: Secondary | ICD-10-CM

## 2022-06-19 DIAGNOSIS — D62 Acute posthemorrhagic anemia: Secondary | ICD-10-CM | POA: Insufficient documentation

## 2022-06-19 DIAGNOSIS — I252 Old myocardial infarction: Secondary | ICD-10-CM

## 2022-06-19 DIAGNOSIS — Z7902 Long term (current) use of antithrombotics/antiplatelets: Secondary | ICD-10-CM

## 2022-06-19 DIAGNOSIS — D649 Anemia, unspecified: Secondary | ICD-10-CM

## 2022-06-19 DIAGNOSIS — Z888 Allergy status to other drugs, medicaments and biological substances status: Secondary | ICD-10-CM

## 2022-06-19 DIAGNOSIS — Y92009 Unspecified place in unspecified non-institutional (private) residence as the place of occurrence of the external cause: Secondary | ICD-10-CM

## 2022-06-19 DIAGNOSIS — E1165 Type 2 diabetes mellitus with hyperglycemia: Secondary | ICD-10-CM | POA: Diagnosis present

## 2022-06-19 DIAGNOSIS — J9601 Acute respiratory failure with hypoxia: Secondary | ICD-10-CM | POA: Diagnosis present

## 2022-06-19 LAB — HEMOGLOBIN AND HEMATOCRIT, BLOOD
HCT: 25.6 % — ABNORMAL LOW (ref 39.0–52.0)
HCT: 24.4 % — ABNORMAL LOW (ref 39.0–52.0)
Hemoglobin: 8.4 g/dL — ABNORMAL LOW (ref 13.0–17.0)
Hemoglobin: 8.1 g/dL — ABNORMAL LOW (ref 13.0–17.0)

## 2022-06-19 LAB — COMPREHENSIVE METABOLIC PANEL
ALT: 20 U/L (ref 0–44)
AST: 39 U/L (ref 15–41)
Albumin: 3.4 g/dL — ABNORMAL LOW (ref 3.5–5.0)
Alkaline Phosphatase: 73 U/L (ref 38–126)
Anion gap: 13 (ref 5–15)
BUN: 21 mg/dL (ref 8–23)
CO2: 25 mmol/L (ref 22–32)
Calcium: 8.8 mg/dL — ABNORMAL LOW (ref 8.9–10.3)
Chloride: 97 mmol/L — ABNORMAL LOW (ref 98–111)
Creatinine, Ser: 1.29 mg/dL — ABNORMAL HIGH (ref 0.61–1.24)
GFR, Estimated: 56 mL/min — ABNORMAL LOW (ref >60)
Glucose, Bld: 231 mg/dL — ABNORMAL HIGH (ref 70–99)
Potassium: 4.9 mmol/L (ref 3.5–5.1)
Sodium: 135 mmol/L (ref 135–145)
Total Bilirubin: 0.7 mg/dL (ref 0.3–1.2)
Total Protein: 6.7 g/dL (ref 6.5–8.1)

## 2022-06-19 LAB — CBC
HCT: 30.9 % — ABNORMAL LOW (ref 39.0–52.0)
Hemoglobin: 9.8 g/dL — ABNORMAL LOW (ref 13.0–17.0)
MCH: 30.2 pg (ref 26.0–34.0)
MCHC: 31.7 g/dL (ref 30.0–36.0)
MCV: 95.4 fL (ref 80.0–100.0)
Platelets: 317 10*3/uL (ref 150–400)
RBC: 3.24 MIL/uL — ABNORMAL LOW (ref 4.22–5.81)
RDW: 13.8 % (ref 11.5–15.5)
WBC: 15.7 10*3/uL — ABNORMAL HIGH (ref 4.0–10.5)
nRBC: 0 % (ref 0.0–0.2)

## 2022-06-19 LAB — GLUCOSE, CAPILLARY: Glucose-Capillary: 193 mg/dL — ABNORMAL HIGH (ref 70–99)

## 2022-06-19 MED ORDER — INSULIN ASPART 100 UNIT/ML IJ SOLN
0.0000 [IU] | Freq: Three times a day (TID) | INTRAMUSCULAR | Status: DC
  Administered 2022-06-20 – 2022-06-23 (×10): 2 [IU] via SUBCUTANEOUS
  Administered 2022-06-23: 3 [IU] via SUBCUTANEOUS
  Administered 2022-06-24 (×2): 2 [IU] via SUBCUTANEOUS
  Administered 2022-06-24: 5 [IU] via SUBCUTANEOUS
  Administered 2022-06-25 (×3): 2 [IU] via SUBCUTANEOUS
  Administered 2022-06-26 (×3): 3 [IU] via SUBCUTANEOUS
  Administered 2022-06-27: 2 [IU] via SUBCUTANEOUS
  Filled 2022-06-19 (×19): qty 1

## 2022-06-19 MED ORDER — IOHEXOL 300 MG/ML  SOLN
100.0000 mL | Freq: Once | INTRAMUSCULAR | Status: AC | PRN
  Administered 2022-06-19: 100 mL via INTRAVENOUS

## 2022-06-19 MED ORDER — MORPHINE SULFATE (PF) 4 MG/ML IV SOLN: 4.0000 mg | Freq: Once | INTRAVENOUS | Status: DC

## 2022-06-19 MED ORDER — SERTRALINE HCL 50 MG PO TABS
25.0000 mg | ORAL_TABLET | Freq: Every day | ORAL | Status: DC
  Administered 2022-06-19 – 2022-07-03 (×15): 25 mg via ORAL
  Filled 2022-06-19 (×15): qty 1

## 2022-06-19 MED ORDER — INSULIN ASPART 100 UNIT/ML IJ SOLN
0.0000 [IU] | Freq: Every day | INTRAMUSCULAR | Status: DC
  Administered 2022-06-23 – 2022-06-26 (×3): 2 [IU] via SUBCUTANEOUS
  Filled 2022-06-19 (×3): qty 1

## 2022-06-19 MED ORDER — PANTOPRAZOLE SODIUM 20 MG PO TBEC
20.0000 mg | DELAYED_RELEASE_TABLET | Freq: Every day | ORAL | Status: DC
  Administered 2022-06-20 – 2022-07-03 (×13): 20 mg via ORAL
  Filled 2022-06-19 (×14): qty 1

## 2022-06-19 MED ORDER — ONDANSETRON HCL 4 MG PO TABS: 4.0000 mg | ORAL_TABLET | Freq: Four times a day (QID) | ORAL | Status: DC | PRN

## 2022-06-19 MED ORDER — SODIUM CHLORIDE 0.9 % IV BOLUS
500.0000 mL | Freq: Once | INTRAVENOUS | Status: AC
  Administered 2022-06-19: 500 mL via INTRAVENOUS

## 2022-06-19 MED ORDER — ONDANSETRON HCL 4 MG/2ML IJ SOLN: 4.0000 mg | Freq: Four times a day (QID) | INTRAMUSCULAR | Status: DC | PRN

## 2022-06-19 MED ORDER — SODIUM CHLORIDE 0.9 % IV SOLN: INTRAVENOUS | Status: DC

## 2022-06-19 MED ORDER — METOPROLOL TARTRATE 25 MG PO TABS
25.0000 mg | ORAL_TABLET | Freq: Two times a day (BID) | ORAL | Status: DC
  Administered 2022-06-19 – 2022-07-03 (×28): 25 mg via ORAL
  Filled 2022-06-19 (×28): qty 1

## 2022-06-19 MED ORDER — ATORVASTATIN CALCIUM 80 MG PO TABS
80.0000 mg | ORAL_TABLET | Freq: Every day | ORAL | Status: DC
  Administered 2022-06-19 – 2022-07-03 (×15): 80 mg via ORAL
  Filled 2022-06-19 (×2): qty 1
  Filled 2022-06-19: qty 4
  Filled 2022-06-19: qty 1
  Filled 2022-06-19 (×2): qty 4
  Filled 2022-06-19 (×6): qty 1
  Filled 2022-06-19: qty 4
  Filled 2022-06-19: qty 1
  Filled 2022-06-19: qty 4

## 2022-06-19 MED ORDER — ONDANSETRON HCL 4 MG/2ML IJ SOLN: 4.0000 mg | Freq: Once | INTRAMUSCULAR | Status: DC

## 2022-06-19 MED ORDER — IBUPROFEN 400 MG PO TABS
400.0000 mg | ORAL_TABLET | Freq: Once | ORAL | Status: AC
  Administered 2022-06-19: 400 mg via ORAL
  Filled 2022-06-19: qty 1

## 2022-06-19 MED ORDER — LISINOPRIL 20 MG PO TABS
20.0000 mg | ORAL_TABLET | Freq: Every day | ORAL | Status: DC
  Administered 2022-06-20 – 2022-07-03 (×14): 20 mg via ORAL
  Filled 2022-06-19 (×14): qty 1

## 2022-06-19 NOTE — ED Notes (Signed)
RN called lab to draw H/H.

## 2022-06-19 NOTE — ED Triage Notes (Signed)
Pt to ED via [facility] from home. Pt was in the shower at home and hit right flank. Pt reports right flank pain. No Loc. Not on blood thinners. Wife reports bump on the back on ribs. EMS reports upon palpation back is symmetrical. 18g LAC. 250cc NS bolus.   BP 128/66 sitting and standing 94/60 HR 75 Cbg 354

## 2022-06-19 NOTE — Assessment & Plan Note (Addendum)
CAD with MI 12/06/2005 status post stenting x 3 to the LAD, TIA/CVA in April 2012,  NSTEMI 04/2022 Status post recent complex PCI x 2 to the LAD and RCA 1/11/ 2024 and additional PCI/DES to the LCx later 1/30/ 2024  Hold ASA and plavix for now in setting of large IM hematoma  Dr. Fletcher Anon w/ on call [facility] made aware Will consult in am  Continue aggressive risk factor modification and secondary prevention including atorvastatin, lisinopril, and metoprolol

## 2022-06-19 NOTE — Assessment & Plan Note (Signed)
Noted  Large intramuscular hematoma within the right paraspinal and posterior flank musculature status post fall in the bathtub On aspirin and Plavix in the setting of coronary artery disease status post stenting Will hold aspirin and Plavix for now Hemoglobin at 9.8 which appears to be near baseline Will trend for now Transfuse for hemoglobin less than 7 Will also reach out to cardiology given holding of antiplatelet medication status post drug-eluting stent within the past 2 months Follow closely

## 2022-06-19 NOTE — Assessment & Plan Note (Signed)
Noted Multiple right posterior lower rib fractures from rib 8 through 12. The ninth, tenth, and eleventh rib fractures are displaced and segmental. There is also fracture of the right T11 transverse process.  Case discussed w/ EDP-no need for ortho involvement  Pain control  Monitor closely

## 2022-06-19 NOTE — ED Notes (Signed)
Report given to Beth, RN

## 2022-06-19 NOTE — ED Notes (Signed)
Ibuprofen given for back pain.

## 2022-06-19 NOTE — Assessment & Plan Note (Signed)
White count 15 on presentation Suspect secondary to posttraumatic demargination No overt signs of infection at present Will otherwise monitor Reassess if patient spikes fever Follow-up closely

## 2022-06-19 NOTE — Assessment & Plan Note (Signed)
Stable.  Continue statin. 

## 2022-06-19 NOTE — Assessment & Plan Note (Signed)
BP stable Titrate home regimen 

## 2022-06-19 NOTE — H&P (Signed)
History and Physical    Patient: Raymond Sampson [id number] DOB: 08-02-1940 DOA: 06/19/2022 DOS: the patient was seen and examined on 06/19/2022 PCP: [name], MD  Patient coming from: Home  Chief Complaint:  Chief Complaint  Patient presents with   Fall   HPI: JOSEL Sampson is a 82 y.o. male with medical history significant of coronary artery disease status post multiple stenting's with most recent stenting January 20 24 x 2, carotid disease, CVA, type 2 diabetes, hyperlipidemia, hypertension presenting with fall, intramuscular hematoma, rib fractures.  History from the patient as well as his wife.  Per report, patient was taking a shower in the bathroom when he slipped on some soap landing on his right side and back area.  No reported head trauma or loss of consciousness.  No reported dizziness or weakness prior to fall.  Has had significant excruciating pain since this point.  Positive chest pain with moving and deep breathing.  No abdominal pain nausea or vomiting. Presented to the ER Tmax 100.4, hemodynamically stable.  White count 15.7, hemoglobin 9.8.  Creatinine 1.29, glucose 231.  CT of the chest abdomen pelvis with multiple right posterior lower rib fractures from ribs 8 through 12.  The ninth 10th and 11th rib fractures are displaced and segmental.  There is a fracture also of the T11 transverse process.  Small right hemothorax with adjacent atelectasis or contusion to the right lower lobe. Review of Systems: As mentioned in the history of present illness. All other systems reviewed and are negative. Past Medical History:  Diagnosis Date   Arthritic-like pain    CAD (coronary artery disease)    a. apical MI 5/07 with LHC showing 50% pCFX, 30% pRCA, 99% pLAD, 99% mLAD, 75% dLAD (cypher DES x3 to LAD); b. ETT-myoview (12/08) 81% MPHR,  EF 64%, partially reversible inferoapical perfusion defect similar to prior study 12/07; c. 04/2016 MV: EF 59%, fixed anteroapical defect,  no ischemia->Low risk.   Carotid arterial disease ([place])    a. 05/2016 Carotid U/S: 30-40% bilat dzs, f/u in 2 yrs.   Chest pain 04/13/2022   CVA (cerebral vascular accident) [facility])    a. 07/2010 - h/o CVA/TIA.   Diet-controlled type 2 diabetes mellitus (HCC)    HLD (hyperlipidemia)    HTN (hypertension)    PAT (paroxysmal atrial tachycardia)    a. 06/2016 Event monitor:  rare episodes of atrial tachycardia, longest 7 beats. No afib.   Past Surgical History:  Procedure Laterality Date   BACK SURGERY     CARDIAC CATHETERIZATION     CORONARY ANGIOPLASTY WITH STENT PLACEMENT     apical MI 5/07 with LHC showing 50% pCFX, 30% pRCA, 99% pLAD, 9%% mLAD, 75% dLAD. cypher DES x3 to LAD. ETT-myoview (12/08) 81% MPHR, 8'1, EF 64%, partially reversible inferoapical perfusion defect similar to prior study 12/07   CORONARY STENT INTERVENTION N/A 04/14/2022   Procedure: CORONARY STENT INTERVENTION;  Surgeon: Nelva Bush, MD;  Location: [place] [facility];  Service: Cardiovascular;  Laterality: N/A;   CORONARY STENT INTERVENTION Left 05/02/2022   Procedure: CORONARY STENT INTERVENTION;  Surgeon: Nelva Bush, MD;  Location: [place] [facility];  Service: Cardiovascular;  Laterality: Left;   INTRAVASCULAR IMAGING/OCT N/A 04/14/2022   Procedure: INTRAVASCULAR IMAGING/OCT;  Surgeon: Nelva Bush, MD;  Location: [place] [facility];  Service: Cardiovascular;  Laterality: N/A;   LEFT HEART CATH AND CORONARY ANGIOGRAPHY N/A 04/13/2022   Procedure: LEFT HEART CATH AND CORONARY ANGIOGRAPHY;  Surgeon: Ellyn Hack,  Leonie Green, MD;  Location: [place] [facility];  Service: Cardiovascular;  Laterality: N/A;   Social History:  reports that he quit smoking about 37 years ago. His smoking use included cigarettes. He has a 25.00 pack-year smoking history. He has never used smokeless tobacco. He reports that he does not drink alcohol and does not use drugs.  Allergies  Allergen Reactions   Metformin And Related  Itching    Erectile dysfunction   Simvastatin Rash    rash    Family History  Family history unknown: Yes    Prior to Admission medications   Medication Sig Start Date End Date Taking? Authorizing Provider  aspirin 81 MG tablet Take 81 mg by mouth daily.   Yes [provider]  atorvastatin (LIPITOR) 80 MG tablet Take 1 tablet (80 mg total) by mouth daily. 04/16/22  Yes Nicole Kindred A, DO  clopidogrel (PLAVIX) 75 MG tablet TAKE 1 TABLET BY MOUTH EVERY DAY WITH BREAKFAST 06/15/22  Yes Gollan, Kathlene November, MD  cyanocobalamin 1000 MCG tablet Take 1,000 mcg by mouth daily.   Yes [provider]  lisinopril (ZESTRIL) 20 MG tablet TAKE 1 TABLET BY MOUTH EVERY DAY 07/04/21  Yes Gollan, Kathlene November, MD  metFORMIN (GLUCOPHAGE) 1000 MG tablet Take 1 tablet (1,000 mg total) by mouth 2 (two) times daily with a meal. Resume 05/05/2022 05/03/22  Yes Dunn, Areta Haber, PA-C  metoprolol tartrate (LOPRESSOR) 25 MG tablet TAKE 1 TABLET BY MOUTH TWICE A DAY 10/18/21  Yes Gollan, Kathlene November, MD  pantoprazole (PROTONIX) 20 MG tablet Take 1 tablet (20 mg total) by mouth daily. 04/16/22  Yes Nicole Kindred A, DO  sertraline (ZOLOFT) 25 MG tablet Take 1 tablet (25 mg total) by mouth daily. 04/26/16  Yes Gollan, Kathlene November, MD  sitaGLIPtin (JANUVIA) 25 MG tablet Take 1 tablet by mouth daily. 02/21/21  Yes [provider]    Physical Exam: Vitals:   06/19/22 1058 06/19/22 1759  BP: (!) 104/50   Pulse: 73   Resp: 17   Temp: 97.7 F (36.5 C) (!) 100.4 F (38 C)  TempSrc: Oral Oral  SpO2: 95%    Physical Exam Constitutional:      Appearance: He is normal weight.  HENT:     Head: Normocephalic and atraumatic.     Nose: Nose normal.     Mouth/Throat:     Mouth: Mucous membranes are moist.  Eyes:     Pupils: Pupils are equal, round, and reactive to light.  Pulmonary:     Effort: Pulmonary effort is normal.     Breath sounds: No wheezing or rales.  Abdominal:     General: Abdomen is flat.  Bowel sounds are normal.  Musculoskeletal:        General: Normal range of motion.     Comments: Positive tender to palpation on the right rib and posterior flank area  Neurological:     General: No focal deficit present.  Psychiatric:        Mood and Affect: Mood normal.     Data Reviewed:  There are no new results to review at this time. CT CHEST ABDOMEN PELVIS W CONTRAST CLINICAL DATA:  Blunt trauma. Fall at home in the shower. Right flank pain.  EXAM: CT CHEST, ABDOMEN, AND PELVIS WITH CONTRAST  TECHNIQUE: Multidetector CT imaging of the chest, abdomen and pelvis was performed following the standard protocol during bolus administration of intravenous contrast.  RADIATION DOSE REDUCTION: This exam was performed according  to the departmental dose-optimization program which includes automated exposure control, adjustment of the mA and/or kV according to patient size and/or use of iterative reconstruction technique.  CONTRAST:  159mL OMNIPAQUE IOHEXOL 300 MG/ML  SOLN  COMPARISON:  Chest radiograph earlier today. Chest CT 04/12/2022  FINDINGS: CT CHEST FINDINGS  Cardiovascular: No evidence of acute aortic or vascular injury. Moderate aortic atherosclerosis and tortuosity coronary artery calcifications versus stents. The heart is normal in size. No pericardial effusion.  Mediastinum/Nodes: Mediastinal hemorrhage or hematoma. No pneumomediastinum. Small mediastinal lymph nodes are all subcentimeter short axis, for example lower paratracheal node measures 7 mm series 2, image 19. Prominent right hilar node, series 2, image 24, partially motion obscured but similar to prior chest CT. No esophageal wall thickening. No visible thyroid nodule.  Lungs/Pleura: No pneumothorax. Small right hemothorax with adjacent atelectasis or contusion in the right lower lobe. Background emphysema. Irregular nodular density in the basilar left lower lobe, series 4, image 99 measures 1.7 x  1.2 cm, previously 2.5 x 1.1 cm. Improvement but persistent heterogeneous ground-glass opacities. No endobronchial lesion.  Musculoskeletal: Multiple posterior lower rib fractures. There are fractures of the right posterior eighth through twelfth ribs; the ninth, tenth, and eleventh rib fractures are displaced and segmental. There is also fracture of the right T11 transverse process. No fracture extension into the vertebral body, middle or anterior columns. No left rib fractures. The sternum, included clavicles and shoulder girdles are intact.  Large intramuscular hematoma within the right paraspinal as well as posterior flank musculature. Overlying subcutaneous edema.  CT ABDOMEN PELVIS FINDINGS  Hepatobiliary: Despite overlying right rib fractures, there is no evidence of hepatic injury or perihepatic hematoma. No focal liver abnormality. Mild gallbladder distention without calcified gallstone or pericholecystic inflammation. Common bile duct measures 7 mm, likely normal for age.  Pancreas: No evidence of injury. No ductal dilatation or inflammation.  Spleen: No splenic injury or perisplenic hematoma.  Adrenals/Urinary Tract: 17 mm right adrenal nodule with Hounsfield units of 38, however on prior exam this with low density and consistent with benign adenoma. No evidence of adrenal hemorrhage. Slight thickening of the right pararenal fascia of but no renal injury. No pararenal hematoma. Early excretion of IV contrast in both renal collecting systems. No suspicious renal lesion. The urinary bladder is partially distended, no bladder injury.  Stomach/Bowel: No evidence of bowel injury or mesenteric hematoma. No bowel wall thickening or inflammation. No obstruction. Moderate volume of stool throughout the colon. Moderate rectal distention with stool. Occasional left colonic diverticula without diverticulitis.  Vascular/Lymphatic: No vascular injury. No retroperitoneal  fluid. Irregular calcified and noncalcified atheromatous plaque throughout the abdominal aorta. Patent portal vein. No abdominopelvic adenopathy.  Reproductive: Prostate is unremarkable.  Other: Subcutaneous contusion involving the right flank. There is also intramuscular hematoma involving the posterior flank musculature. No intra-abdominal hematoma or free air.  Musculoskeletal: No acute fracture of the lumbar spine or pelvis. L5 is sacralized. The pubic symphysis is fused. There is a bone island in the right iliac bone.  IMPRESSION: 1. Multiple right posterior lower rib fractures from rib 8 through 12. The ninth, tenth, and eleventh rib fractures are displaced and segmental. There is also fracture of the right T11 transverse process. 2. Small right hemothorax with adjacent atelectasis or contusion in the right lower lobe. No pneumothorax. 3. Large intramuscular hematoma within the right paraspinal and posterior flank musculature. 4. No evidence of acute traumatic injury to the abdomen or pelvis. 5. Irregular nodular density in the basilar  left lower lobe persists, however is slightly decreased in size from prior exam. It is unclear if decrease in size is due to differences in slice selection or screw decrease in size from prior exam. Recommend additional follow-up chest CT or PET CT in 3 months.  Aortic Atherosclerosis (ICD10-I70.0) and Emphysema (ICD10-J43.9).  Electronically Signed   By: Keith Rake M.D.   On: 06/19/2022 15:13 DG Chest 2 View CLINICAL DATA:  Right flank pain after injury in the shower.  EXAM: CHEST - 2 VIEW  COMPARISON:  CT chest and chest x-ray dated April 12, 2022.  FINDINGS: The heart size and mediastinal contours are within normal limits. Normal pulmonary vascularity. Density in the posterior left lower lobe persists as seen on prior CT. Mild elevation of the right hemidiaphragm with minimal right basilar atelectasis. No  pleural effusion or pneumothorax. No acute osseous abnormality.  IMPRESSION: 1. No acute cardiopulmonary disease. 2. Persistent posterior left lower lobe nodule as seen on prior CT. As per prior recommendations, follow-up noncontrast chest CT, PET-CT, or tissue sampling is recommended now, unless the patient has significant comorbidities.  Electronically Signed   By: Titus Dubin M.D.   On: 06/19/2022 11:34  Lab Results  Component Value Date   WBC 15.7 (H) 06/19/2022   HGB 9.8 (L) 06/19/2022   HCT 30.9 (L) 06/19/2022   MCV 95.4 06/19/2022   PLT 317 [id number]   Last metabolic panel Lab Results  Component Value Date   GLUCOSE 231 (H) 06/19/2022   NA 135 06/19/2022   K 4.9 06/19/2022   CL 97 (L) 06/19/2022   CO2 25 06/19/2022   BUN 21 06/19/2022   CREATININE 1.29 (H) 06/19/2022   GFRNONAA 56 (L) 06/19/2022   CALCIUM 8.8 (L) 06/19/2022   PROT 6.7 06/19/2022   ALBUMIN 3.4 (L) 06/19/2022   BILITOT 0.7 06/19/2022   ALKPHOS 73 06/19/2022   AST 39 06/19/2022   ALT 20 06/19/2022   ANIONGAP 13 06/19/2022    Assessment and Plan: * Intramuscular hematoma Noted  Large intramuscular hematoma within the right paraspinal and posterior flank musculature status post fall in the bathtub On aspirin and Plavix in the setting of coronary artery disease status post stenting Will hold aspirin and Plavix for now Hemoglobin at 9.8 which appears to be near baseline Will trend for now Transfuse for hemoglobin less than 7 Will also reach out to cardiology given holding of antiplatelet medication status post drug-eluting stent within the past 2 months Follow closely  Rib fractures Noted Multiple right posterior lower rib fractures from rib 8 through 12. The ninth, tenth, and eleventh rib fractures are displaced and segmental. There is also fracture of the right T11 transverse process.  Case discussed w/ EDP-no need for ortho involvement  Pain control  Monitor closely   Status post  coronary artery stent placement CAD with MI 12/06/2005 status post stenting x 3 to the LAD, TIA/CVA in April 2012,  NSTEMI 04/2022 Status post recent complex PCI x 2 to the LAD and RCA 1/11/ 2024 and additional PCI/DES to the LCx later 1/30/ 2024  Hold ASA and plavix for now in setting of large IM hematoma  Dr. Fletcher Anon w/ on call [facility] Cardiology made aware Will consult in am  Continue aggressive risk factor modification and secondary prevention including atorvastatin, lisinopril, and metoprolol   Leukocytosis White count 15 on presentation Suspect secondary to posttraumatic demargination No overt signs of infection at present Will otherwise monitor Reassess if patient spikes fever Follow-up closely  Hemothorax Small right hemothorax on the right with adjacent atelectasis and contusion. Discussed with on-call ER physician Okay to monitor for now Repeat chest x-ray in the morning Reassess if there is any decline from a cardiac or respiratory standpoint Follow  Diabetes mellitus type 2, controlled, with complications (HCC) Sliding-scale insulin  Essential hypertension BP stable Titrate home regimen  Hyperlipidemia Stable Continue statin  Pneumothorax-resolved as of 06/19/2022  Small right hemothorax with adjacent atelectasis or contusion in the right lower lobe Stable on RA at present  Reassess w/ general surgery evaluation if there is any decline from a resp standpoint       Advance Care Planning:   Code Status: Full Code   Consults: None   Family Communication: Wife at the bedside   Severity of Illness: The appropriate patient status for this patient is OBSERVATION. Observation status is judged to be reasonable and necessary in order to provide the required intensity of service to ensure the patient's safety. The patient's presenting symptoms, physical exam findings, and initial radiographic and laboratory data in the context of their medical condition is felt to place them  at decreased risk for further clinical deterioration. Furthermore, it is anticipated that the patient will be medically stable for discharge from the hospital within 2 midnights of admission.   Author: Deneise Lever, MD 06/19/2022 6:03 PM  For on call review [email or web address].

## 2022-06-19 NOTE — Assessment & Plan Note (Signed)
Small right hemothorax on the right with adjacent atelectasis and contusion. Discussed with on-call ER physician Okay to monitor for now Repeat chest x-ray in the morning Reassess if there is any decline from a cardiac or respiratory standpoint Follow

## 2022-06-19 NOTE — ED Provider Notes (Signed)
[facility] Provider Note    Event Date/Time   First MD Initiated Contact with Patient 06/19/22 1056     (approximate)   History   Fall   HPI  Raymond Sampson is a 82 y.o. male with a history of CAD, CVA, diabetes, hypertension on aspirin and Plavix who presents after a fall.  Patient reports he slipped in the shower and fell onto his right side.  Complains of right back pain.  Denies head injury.  No extremity injuries.     Physical Exam   Triage Vital Signs: ED Triage Vitals [06/19/22 1058]  Enc Vitals Group     BP (!) 104/50     Pulse Rate 73     Resp 17     Temp 97.7 F (36.5 C)     Temp Source Oral     SpO2 95 %     Weight      Height      Head Circumference      Peak Flow      Pain Score      Pain Loc      Pain Edu?      Excl. in [place]?     Most recent vital signs: Vitals:   06/19/22 1058  BP: (!) 104/50  Pulse: 73  Resp: 17  Temp: 97.7 F (36.5 C)  SpO2: 95%     General: Awake, no distress.  CV:  Good peripheral perfusion.  Posterior right ribs, large area of swelling with bruising and tenderness, no crepitus Resp:  Normal effort.  Abd:  No distention.  Soft, nontender Other:  Normal range of motion of all extremities without discomfort, no vertebral tenderness palpation, normal range of motion of the cervical spine.   ED Results / Procedures / Treatments   Labs (all labs ordered are listed, but only abnormal results are displayed) Labs Reviewed  CBC - Abnormal; Notable for the following components:      Result Value   WBC 15.7 (*)    RBC 3.24 (*)    Hemoglobin 9.8 (*)    HCT 30.9 (*)    All other components within normal limits  COMPREHENSIVE METABOLIC PANEL - Abnormal; Notable for the following components:   Chloride 97 (*)    Glucose, Bld 231 (*)    Creatinine, Ser 1.29 (*)    Calcium 8.8 (*)    Albumin 3.4 (*)    GFR, Estimated 56 (*)    All other components within normal limits     EKG  ED ECG  REPORT I, Lavonia Drafts, the attending physician, personally viewed and interpreted this ECG.  Date: 06/19/2022  Rhythm: normal sinus rhythm QRS Axis: normal Intervals: normal ST/T Wave abnormalities: normal Narrative Interpretation: no evidence of acute ischemia    RADIOLOGY Chest x-ray viewed interpreted by me, no acute abnormality  CT scan demonstrates multiple rib fractures large hematoma, small hemothorax, no pneumothorax    PROCEDURES:  Critical Care performed: yes  CRITICAL CARE Performed by: Lavonia Drafts   Total critical care time: 30  minutes  Critical care time was exclusive of separately billable procedures and treating other patients.  Critical care was necessary to treat or prevent imminent or life-threatening deterioration.  Critical care was time spent personally by me on the following activities: development of treatment plan with patient and/or surrogate as well as nursing, discussions with consultants, evaluation of patient's response to treatment, examination of patient, obtaining history from patient or surrogate, ordering  and performing treatments and interventions, ordering and review of laboratory studies, ordering and review of radiographic studies, pulse oximetry and re-evaluation of patient's condition.   Procedures   MEDICATIONS ORDERED IN ED: Medications  morphine (PF) 4 MG/ML injection 4 mg (has no administration in time range)  ondansetron (ZOFRAN) injection 4 mg (has no administration in time range)  sodium chloride 0.9 % bolus 500 mL (500 mLs Intravenous New Bag/Given 06/19/22 1358)  iohexol (OMNIPAQUE) 300 MG/ML solution 100 mL (100 mLs Intravenous Contrast Given 06/19/22 1428)     IMPRESSION / MDM / ASSESSMENT AND PLAN / ED COURSE  I reviewed the triage vital signs and the nursing notes. Patient's presentation is most consistent with acute presentation with potential threat to life or bodily function.  Patient presents after a fall  as described above.  He has a large area of swelling to the posterior right chest wall x-rays without acute abnormalities, will send to CT given significant swelling, hematoma  Lab work notable for hemoglobin of 9.8, slightly below prior to white blood cell count.  CT scan demonstrates multiple rib fractures, small hemothorax, no pneumothorax  Patient is treated with IV morphine, IV Zofran  Discussed with the hospitalist for admission for pain control, monitoring       FINAL CLINICAL IMPRESSION(S) / ED DIAGNOSES   Final diagnoses:  Fall, initial encounter  Closed fracture of multiple ribs of right side, initial encounter  Hemothorax on right     Rx / DC Orders   ED Discharge Orders     None        Note:  This document was prepared using Dragon voice recognition software and may include unintentional dictation errors.   Lavonia Drafts, MD 06/19/22 [phone]

## 2022-06-19 NOTE — Assessment & Plan Note (Signed)
Sliding scale insulin 

## 2022-06-19 NOTE — Assessment & Plan Note (Signed)
Small right hemothorax with adjacent atelectasis or contusion in the right lower lobe Stable on RA at present  Reassess w/ general surgery evaluation if there is any decline from a resp standpoint

## 2022-06-20 ENCOUNTER — Inpatient Hospital Stay: Payer: PPO

## 2022-06-20 ENCOUNTER — Encounter: Payer: Self-pay | Admitting: Family Medicine

## 2022-06-20 DIAGNOSIS — I110 Hypertensive heart disease with heart failure: Secondary | ICD-10-CM | POA: Diagnosis present

## 2022-06-20 DIAGNOSIS — Z8616 Personal history of COVID-19: Secondary | ICD-10-CM | POA: Diagnosis not present

## 2022-06-20 DIAGNOSIS — Z87891 Personal history of nicotine dependence: Secondary | ICD-10-CM | POA: Diagnosis not present

## 2022-06-20 DIAGNOSIS — E1151 Type 2 diabetes mellitus with diabetic peripheral angiopathy without gangrene: Secondary | ICD-10-CM | POA: Diagnosis present

## 2022-06-20 DIAGNOSIS — S2241XA Multiple fractures of ribs, right side, initial encounter for closed fracture: Secondary | ICD-10-CM | POA: Diagnosis present

## 2022-06-20 DIAGNOSIS — F0390 Unspecified dementia without behavioral disturbance: Secondary | ICD-10-CM | POA: Diagnosis present

## 2022-06-20 DIAGNOSIS — E1165 Type 2 diabetes mellitus with hyperglycemia: Secondary | ICD-10-CM | POA: Diagnosis present

## 2022-06-20 DIAGNOSIS — S2249XA Multiple fractures of ribs, unspecified side, initial encounter for closed fracture: Secondary | ICD-10-CM | POA: Diagnosis present

## 2022-06-20 DIAGNOSIS — S272XXA Traumatic hemopneumothorax, initial encounter: Secondary | ICD-10-CM | POA: Diagnosis present

## 2022-06-20 DIAGNOSIS — E440 Moderate protein-calorie malnutrition: Secondary | ICD-10-CM | POA: Diagnosis present

## 2022-06-20 DIAGNOSIS — I2489 Other forms of acute ischemic heart disease: Secondary | ICD-10-CM | POA: Diagnosis present

## 2022-06-20 DIAGNOSIS — Z7984 Long term (current) use of oral hypoglycemic drugs: Secondary | ICD-10-CM | POA: Diagnosis not present

## 2022-06-20 DIAGNOSIS — Z8673 Personal history of transient ischemic attack (TIA), and cerebral infarction without residual deficits: Secondary | ICD-10-CM | POA: Diagnosis not present

## 2022-06-20 DIAGNOSIS — J9601 Acute respiratory failure with hypoxia: Secondary | ICD-10-CM | POA: Diagnosis present

## 2022-06-20 DIAGNOSIS — Z7189 Other specified counseling: Secondary | ICD-10-CM | POA: Diagnosis not present

## 2022-06-20 DIAGNOSIS — Z955 Presence of coronary angioplasty implant and graft: Secondary | ICD-10-CM | POA: Diagnosis not present

## 2022-06-20 DIAGNOSIS — J942 Hemothorax: Secondary | ICD-10-CM | POA: Diagnosis present

## 2022-06-20 DIAGNOSIS — Y92009 Unspecified place in unspecified non-institutional (private) residence as the place of occurrence of the external cause: Secondary | ICD-10-CM | POA: Diagnosis not present

## 2022-06-20 DIAGNOSIS — I25118 Atherosclerotic heart disease of native coronary artery with other forms of angina pectoris: Secondary | ICD-10-CM | POA: Diagnosis not present

## 2022-06-20 DIAGNOSIS — Z7982 Long term (current) use of aspirin: Secondary | ICD-10-CM | POA: Diagnosis not present

## 2022-06-20 DIAGNOSIS — Y93E1 Activity, personal bathing and showering: Secondary | ICD-10-CM | POA: Diagnosis not present

## 2022-06-20 DIAGNOSIS — I10 Essential (primary) hypertension: Secondary | ICD-10-CM | POA: Diagnosis not present

## 2022-06-20 DIAGNOSIS — Z6822 Body mass index (BMI) 22.0-22.9, adult: Secondary | ICD-10-CM | POA: Diagnosis not present

## 2022-06-20 DIAGNOSIS — I4719 Other supraventricular tachycardia: Secondary | ICD-10-CM | POA: Diagnosis present

## 2022-06-20 DIAGNOSIS — Z79899 Other long term (current) drug therapy: Secondary | ICD-10-CM | POA: Diagnosis not present

## 2022-06-20 DIAGNOSIS — E785 Hyperlipidemia, unspecified: Secondary | ICD-10-CM | POA: Diagnosis present

## 2022-06-20 DIAGNOSIS — Z66 Do not resuscitate: Secondary | ICD-10-CM | POA: Diagnosis present

## 2022-06-20 DIAGNOSIS — J9811 Atelectasis: Secondary | ICD-10-CM | POA: Diagnosis present

## 2022-06-20 DIAGNOSIS — I2510 Atherosclerotic heart disease of native coronary artery without angina pectoris: Secondary | ICD-10-CM | POA: Diagnosis present

## 2022-06-20 DIAGNOSIS — Z888 Allergy status to other drugs, medicaments and biological substances status: Secondary | ICD-10-CM | POA: Diagnosis not present

## 2022-06-20 DIAGNOSIS — W19XXXA Unspecified fall, initial encounter: Secondary | ICD-10-CM | POA: Diagnosis not present

## 2022-06-20 DIAGNOSIS — D62 Acute posthemorrhagic anemia: Secondary | ICD-10-CM | POA: Diagnosis present

## 2022-06-20 DIAGNOSIS — T148XXA Other injury of unspecified body region, initial encounter: Secondary | ICD-10-CM | POA: Diagnosis not present

## 2022-06-20 DIAGNOSIS — Z515 Encounter for palliative care: Secondary | ICD-10-CM | POA: Diagnosis not present

## 2022-06-20 DIAGNOSIS — W182XXA Fall in (into) shower or empty bathtub, initial encounter: Secondary | ICD-10-CM | POA: Diagnosis present

## 2022-06-20 LAB — COMPREHENSIVE METABOLIC PANEL
ALT: 21 U/L (ref 0–44)
AST: 31 U/L (ref 15–41)
Albumin: 3.4 g/dL — ABNORMAL LOW (ref 3.5–5.0)
Alkaline Phosphatase: 62 U/L (ref 38–126)
Anion gap: 12 (ref 5–15)
BUN: 25 mg/dL — ABNORMAL HIGH (ref 8–23)
CO2: 24 mmol/L (ref 22–32)
Calcium: 8.7 mg/dL — ABNORMAL LOW (ref 8.9–10.3)
Chloride: 98 mmol/L (ref 98–111)
Creatinine, Ser: 1.17 mg/dL (ref 0.61–1.24)
GFR, Estimated: >60 mL/min (ref >60)
Glucose, Bld: 171 mg/dL — ABNORMAL HIGH (ref 70–99)
Potassium: 4.3 mmol/L (ref 3.5–5.1)
Sodium: 134 mmol/L — ABNORMAL LOW (ref 135–145)
Total Bilirubin: 0.9 mg/dL (ref 0.3–1.2)
Total Protein: 6.5 g/dL (ref 6.5–8.1)

## 2022-06-20 LAB — HEMOGLOBIN AND HEMATOCRIT, BLOOD
HCT: 27.3 % — ABNORMAL LOW (ref 39.0–52.0)
HCT: 23.2 % — ABNORMAL LOW (ref 39.0–52.0)
Hemoglobin: 8.8 g/dL — ABNORMAL LOW (ref 13.0–17.0)
Hemoglobin: 7.5 g/dL — ABNORMAL LOW (ref 13.0–17.0)

## 2022-06-20 LAB — GLUCOSE, CAPILLARY
Glucose-Capillary: 193 mg/dL — ABNORMAL HIGH (ref 70–99)
Glucose-Capillary: 178 mg/dL — ABNORMAL HIGH (ref 70–99)
Glucose-Capillary: 170 mg/dL — ABNORMAL HIGH (ref 70–99)
Glucose-Capillary: 187 mg/dL — ABNORMAL HIGH (ref 70–99)

## 2022-06-20 MED ORDER — ASPIRIN 81 MG PO TBEC
81.0000 mg | DELAYED_RELEASE_TABLET | Freq: Every day | ORAL | Status: DC
  Administered 2022-06-20 – 2022-07-03 (×14): 81 mg via ORAL
  Filled 2022-06-20 (×14): qty 1

## 2022-06-20 MED ORDER — OXYCODONE HCL 5 MG PO TABS
5.0000 mg | ORAL_TABLET | Freq: Four times a day (QID) | ORAL | Status: DC | PRN
  Administered 2022-06-28 – 2022-06-29 (×5): 5 mg via ORAL
  Filled 2022-06-20 (×7): qty 1

## 2022-06-20 MED ORDER — MORPHINE SULFATE (PF) 2 MG/ML IV SOLN: 2.0000 mg | INTRAVENOUS | Status: DC | PRN

## 2022-06-20 NOTE — Consult Note (Addendum)
Cardiology Consultation:   Patient ID: Raymond Sampson; [id number]; 1940-08-07   Admit date: 06/19/2022 Date of Consult: 06/20/2022  Primary Care Provider: [name], MD Primary Cardiologist: Raymond Sampson Primary Electrophysiologist:  None   Patient Profile:   Raymond Sampson is a 82 y.o. male with a hx of CAD with apical MI in 2007 status post stenting x 3 to the LAD with NSTEMI in 04/2022 status post PCI and DES to the LAD and RCA with staged PCI to the LCx, TIA/CVA in 2012, atrial tachycardia, bilateral carotid artery disease, dementia, DM2, HTN, HLD, and prior tobacco use who is being seen today for the evaluation of antiplatelet management in the context of significant trauma following mechanical fall at the request of Dr. Ernestina Sampson.  History of Present Illness:   Raymond Sampson was admitted  to the hospital in 08/2005 with an MI that was treated with PCI/DES x 3 to the LAD. Stress test in 2013 showed no significant ischemia, EF 68%, with severely elevated BP. Echo in 2014 showed an EF of 123456, diastolic dysfunction, normal RVSF and cavity size, normal RVSP. He was seen in early 2018 with palpitations and abnormal EKG leading to a Myoview in 04/2016 that was low risk. Outpatient cardiac monitoring in 05/2016 showed NSR with an average heart rate of 69 bpm (40-171 bpm), 7 atrial tachycardic runs occurred with the longest lasting 7 beats with an average rate of 105 bpm with no evidence of Afib.  Most recent carotid artery ultrasound from 12/2018 showed 1 to 39% bilateral ICA stenosis with bilateral vertebral arteries demonstrating antegrade flow and normal flow hemodynamics in the bilateral subclavian arteries.   He was admitted to [facility] from 1/10 through 04/15/2022 with an NSTEMI.  High-sensitivity troponin peaked at 1422.  CTA of the chest showed no evidence of PE with an incidental nodular density noted in the left lung base as well as mild right hilar lymphadenopathy, right adrenal adenoma, and  aortic atherosclerosis.  Echo showed an EF of 55 to 60%, mild LVH, hypokinesis of the basal septal region, grade 1 diastolic dysfunction, normal RV systolic function and ventricular cavity size, normal PASP, aortic valve sclerosis without evidence of stenosis, and normal estimated right atrial pressure.  LHC on 04/13/2022 showed severe three-vessel CAD as outlined below.  After further discussion, he was not felt to be a CABG candidate given advanced age and dementia with recommendation to proceed with complex PCI intervention.  He subsequently underwent repeat LHC on 04/14/2022 that again showed severe three-vessel CAD.  He underwent successful PCI/DES to the proximal LAD, PCI/DES to the proximal RCA, and PCI/DES to in-stent restenosis involving the long segment of mid/distal LAD stenting.  The entire stent was angioplastied and Onyx Frontier 2.0 x 18 mm drug-eluting stent was placed at the distal edge leading to reduction of stenosis from 70% to 20 to 30% in the proximal and mid portions with 0% residual stenosis at the distal edge and TIMI-3 flow throughout the vessel.  Following these interventions, he remained chest pain-free with family preferring the patient be discharged with planned staged PCI to the LCx be pursued in the outpatient setting.   He was seen in hospital follow-up on 04/26/2022 and was without symptoms of angina or cardiac decompensation.  He was adherent to cardiac medications.  No falls or hematochezia/melena.  His wife did report a 2-day history of dark urine following his hospital discharge that subsequently resolved without intervention and had not recurred.  Patient and wife  remain interested in moving forward with staged PCI of the LCx.   He underwent staged PCI of the LCx on 05/02/2022 with procedure notable for a small hematoma that remained stable and did not require treatment beyond manual compression.  Postprocedural hemoglobin was mildly low, though stable on repeat at 10.9.  He  was last seen in the office in 05/2022 and remained without symptoms of angina or cardiac decompensation.  He was admitted to [facility] on 06/19/2022 after sustaining a mechanical fall, slipping in the bathtub.  He denies any symptoms of angina, palpitations, or dizziness surrounding this event.  Upon his arrival to the ED, he was found to have multiple right posterior lower rib fractures from ribs 8 through 12 with the ninth, 10th, and 11th rib fractures noted to be displaced and segmental.  There was also a fracture of the T11 transverse process and a small right pneumothorax with adjacent atelectasis versus contusion to the right lower lobe.  There was a large intramuscular hematoma within the right paraspinal and posterior flank musculature as well.  He has remained hemodynamically stable.  Upon admission, aspirin and clopidogrel were held.  Initial hemoglobin of 9.8 with a baseline between approximately 11 and 12.  Hemoglobin subsequently trended to a low of 8.1, currently 8.8.  He is seen this morning with his wife at bedside.  Both patient and wife deny any symptoms of angina or cardiac decompensation.  The wife reports the patient has actually been doing quite well, working with cardiac rehab/PT.    Past Medical History:  Diagnosis Date   Arthritic-like pain    CAD (coronary artery disease)    a. apical MI 5/07 with LHC showing 50% pCFX, 30% pRCA, 99% pLAD, 99% mLAD, 75% dLAD (cypher DES x3 to LAD); b. ETT-myoview (12/08) 81% MPHR,  EF 64%, partially reversible inferoapical perfusion defect similar to prior study 12/07; c. 04/2016 MV: EF 59%, fixed anteroapical defect, no ischemia->Low risk.   Carotid arterial disease ([place])    a. 05/2016 Carotid U/S: 30-40% bilat dzs, f/u in 2 yrs.   Chest pain 04/13/2022   CVA (cerebral vascular accident) [facility])    a. 07/2010 - h/o CVA/TIA.   Diet-controlled type 2 diabetes mellitus (HCC)    HLD (hyperlipidemia)    HTN (hypertension)    PAT (paroxysmal atrial  tachycardia)    a. 06/2016 Event monitor:  rare episodes of atrial tachycardia, longest 7 beats. No afib.    Past Surgical History:  Procedure Laterality Date   BACK SURGERY     CARDIAC CATHETERIZATION     CORONARY ANGIOPLASTY WITH STENT PLACEMENT     apical MI 5/07 with LHC showing 50% pCFX, 30% pRCA, 99% pLAD, 9%% mLAD, 75% dLAD. cypher DES x3 to LAD. ETT-myoview (12/08) 81% MPHR, 8'1, EF 64%, partially reversible inferoapical perfusion defect similar to prior study 12/07   CORONARY STENT INTERVENTION N/A 04/14/2022   Procedure: CORONARY STENT INTERVENTION;  Surgeon: Nelva Bush, MD;  Location: [place] [facility];  Service: Cardiovascular;  Laterality: N/A;   CORONARY STENT INTERVENTION Left 05/02/2022   Procedure: CORONARY STENT INTERVENTION;  Surgeon: Nelva Bush, MD;  Location: [place] [facility];  Service: Cardiovascular;  Laterality: Left;   INTRAVASCULAR IMAGING/OCT N/A 04/14/2022   Procedure: INTRAVASCULAR IMAGING/OCT;  Surgeon: Nelva Bush, MD;  Location: [place] [facility];  Service: Cardiovascular;  Laterality: N/A;   LEFT HEART CATH AND CORONARY ANGIOGRAPHY N/A 04/13/2022   Procedure: LEFT HEART CATH AND CORONARY ANGIOGRAPHY;  Surgeon: Glenetta Hew  W, MD;  Location: [place] [facility];  Service: Cardiovascular;  Laterality: N/A;     Home Meds: Prior to Admission medications   Medication Sig Start Date End Date Taking? Authorizing Provider  aspirin 81 MG tablet Take 81 mg by mouth daily.   Yes [provider]  atorvastatin (LIPITOR) 80 MG tablet Take 1 tablet (80 mg total) by mouth daily. 04/16/22  Yes Nicole Kindred A, DO  clopidogrel (PLAVIX) 75 MG tablet TAKE 1 TABLET BY MOUTH EVERY DAY WITH BREAKFAST 06/15/22  Yes Gollan, Kathlene November, MD  cyanocobalamin 1000 MCG tablet Take 1,000 mcg by mouth daily.   Yes [provider]  lisinopril (ZESTRIL) 20 MG tablet TAKE 1 TABLET BY MOUTH EVERY DAY 07/04/21  Yes Gollan, Kathlene November, MD  metFORMIN  (GLUCOPHAGE) 1000 MG tablet Take 1 tablet (1,000 mg total) by mouth 2 (two) times daily with a meal. Resume 05/05/2022 05/03/22  Yes Chidera Thivierge, Areta Haber, PA-C  metoprolol tartrate (LOPRESSOR) 25 MG tablet TAKE 1 TABLET BY MOUTH TWICE A DAY 10/18/21  Yes Gollan, Kathlene November, MD  pantoprazole (PROTONIX) 20 MG tablet Take 1 tablet (20 mg total) by mouth daily. 04/16/22  Yes Nicole Kindred A, DO  sertraline (ZOLOFT) 25 MG tablet Take 1 tablet (25 mg total) by mouth daily. 04/26/16  Yes Gollan, Kathlene November, MD  sitaGLIPtin (JANUVIA) 25 MG tablet Take 1 tablet by mouth daily. 02/21/21  Yes [provider]    Inpatient Medications: Scheduled Meds:  aspirin EC  81 mg Oral Daily   atorvastatin  80 mg Oral Daily   insulin aspart  0-5 Units Subcutaneous QHS   insulin aspart  0-9 Units Subcutaneous TID WC   lisinopril  20 mg Oral Daily   metoprolol tartrate  25 mg Oral BID    morphine injection  4 mg Intravenous Once   ondansetron (ZOFRAN) IV  4 mg Intravenous Once   pantoprazole  20 mg Oral Daily   sertraline  25 mg Oral Daily   Continuous Infusions:  sodium chloride 50 mL/hr at 06/19/22 1824   PRN Meds: ondansetron **OR** ondansetron (ZOFRAN) IV  Allergies:   Allergies  Allergen Reactions   Metformin And Related Itching    Erectile dysfunction   Simvastatin Rash    rash    Social History:   Social History   Socioeconomic History   Marital status: Married    Spouse name: Not on file   Number of children: Not on file   Years of education: Not on file   Highest education level: Not on file  Occupational History   Occupation: retired  Tobacco Use   Smoking status: Former    Packs/day: 1.00    Years: 25.00    Additional pack years: 0.00    Total pack years: 25.00    Types: Cigarettes    Quit date: 04/17/1985    Years since quitting: 37.2   Smokeless tobacco: Never  Vaping Use   Vaping Use: Never used  Substance and Sexual Activity   Alcohol use: No   Drug use: No   Sexual  activity: Not on file  Other Topics Concern   Not on file  Social History Narrative   Part time work at [employer]. Does not regularly exercise.    Social Determinants of Health   Financial Resource Strain: Not on file  Food Insecurity: No Food Insecurity (06/19/2022)   Hunger Vital Sign    Worried About Running Out of Food in the Last Year:  Never true    Ran Out of Food in the Last Year: Never true  Transportation Needs: No Transportation Needs (06/19/2022)   PRAPARE - Hydrologist (Medical): No    Lack of Transportation (Non-Medical): No  Physical Activity: Not on file  Stress: Not on file  Social Connections: Not on file  Intimate Partner Violence: Not At Risk (06/19/2022)   Humiliation, Afraid, Rape, and Kick questionnaire    Fear of Current or Ex-Partner: No    Emotionally Abused: No    Physically Abused: No    Sexually Abused: No     Family History:   Family History  Family history unknown: Yes    ROS:  Review of Systems  Constitutional:  Positive for malaise/fatigue. Negative for chills, diaphoresis, fever and weight loss.  HENT:  Negative for congestion.   Eyes:  Negative for discharge and redness.  Respiratory:  Negative for cough, sputum production, shortness of breath and wheezing.   Cardiovascular:  Positive for chest pain. Negative for palpitations, orthopnea, claudication, leg swelling and PND.       Right lateral chest wall/flank  Gastrointestinal:  Negative for abdominal pain, heartburn, nausea and vomiting.  Musculoskeletal:  Positive for back pain and falls. Negative for myalgias.  Skin:  Negative for rash.  Neurological:  Negative for dizziness, tingling, tremors, sensory change, speech change, focal weakness, loss of consciousness and weakness.  Endo/Heme/Allergies:  Does not bruise/bleed easily.  Psychiatric/Behavioral:  Negative for substance abuse. The patient is not nervous/anxious.   All other systems reviewed and  are negative.     Physical Exam/Data:   Vitals:   06/19/22 1934 06/19/22 2124 06/19/22 2203 06/20/22 0639  BP:  (!) 140/62 126/63 (!) 152/60  Pulse:  (!) 107 (!) 102 91  Resp:  15 17 19   Temp: 98.1 F (36.7 C) 98.4 F (36.9 C) 98.6 F (37 C) 97.8 F (36.6 C)  TempSrc: Oral Oral    SpO2:  96% 94% 91%  Weight:  69.6 kg    Height:  5\' 7"  (1.702 m)     No intake or output data in the 24 hours ending 06/20/22 0747 Filed Weights   06/19/22 2124  Weight: 69.6 kg   Body mass index is 24.03 kg/m.   Physical Exam: General: Well developed, well nourished, in no acute distress. Head: Normocephalic, atraumatic, sclera non-icteric, no xanthomas, nares without discharge.  Neck: Negative for carotid bruits. JVD not elevated. Lungs: Diminished breath sounds bilaterally with poor inspiratory effort. Breathing is unlabored. Heart: RRR with S1 S2. No murmurs, rubs, or gallops appreciated. Abdomen: Soft, non-tender, non-distended with normoactive bowel sounds. No hepatomegaly. No rebound/guarding. No obvious abdominal masses. Msk:  Strength and tone appear normal for age.  Large area of bruising along the right lateral chest wall extending distally to the right flank with noted tenderness to palpation. Extremities: No clubbing or cyanosis. No edema. Distal pedal pulses are 2+ and equal bilaterally. Neuro: Alert and oriented X 3. No facial asymmetry. No focal deficit. Moves all extremities spontaneously. Psych:  Responds to questions appropriately with a normal affect.   EKG:  The EKG was personally reviewed and demonstrates: NSR, 60 bpm, right axis deviation, improved anterior ST-T changes Telemetry:  Telemetry was personally reviewed and demonstrates: Not on tele  Weights: Filed Weights   06/19/22 2124  Weight: 69.6 kg    Relevant CV Studies:  LHC 05/02/2022: Conclusions: Patent LAD stents with stable mild in-stent restenosis of remotely placed mid-distal  LAD stents, unchanged from  completion of last catheterization.  Recently placed proximal LAD stent is widely patent. Severe LCx/OM2 disease, similar to prior catheterizations. Mildly elevated left ventricular filling pressure. Successful complex bifurcation PCI to LCx/OM2 using Culotte technique (Onyx frontier 2.75 x 30 mm main branch and 2.5 x 15 mm side branch drug-eluting stents) with 0% residual stenosis and TIMI-3 flow.   Recommendations: Continue dual antiplatelet therapy with aspirin and clopidogrel for at least 12 months. Aggressive secondary prevention. Overnight observation. ___________ [facility] 04/14/2022: Conclusions: Severe three-vessel coronary artery disease, as detailed in Dr. Allison Quarry diagnostic catheterization report from yesterday. Successful PCI to proximal RCA using Onyx Frontier 2.5 x 18 mm drug-eluting stent with 0% residual stenosis and TIMI-3 flow. Successful PCI to proximal LAD stenosis using Onyx Frontier 3.5 x 15 mm drug-eluting stent with 0% residual stenosis and TIMI-3 flow. Successful angioplasty and drug-eluting stent placement to in-stent restenosis involving long segment of mid/distal LAD stenting.  The entire stent was angioplastied and an Onyx Frontier 2.0 x 18 mm drug-eluting stent placed at the distal edge, leading to reduction of stenosis from 70% to 20-30% in the proximal and mid portions and 0% residual stenosis at the distal edge with TIMI-3 flow throughout the vessel.   Recommendations: Continue indefinite dual antiplatelet therapy with aspirin and clopidogrel. Aggressive secondary prevention of coronary artery disease. Staged PCI to LCx disease.  Timing will depend on patient's symptoms.  If he is angina free and otherwise stable for discharge over the weekend, staged PCI could be arranged to be done in the next 1 to 2 weeks as an outpatient.  If he has recurrent angina or is otherwise still in the hospital, PCI to the LCx could be performed early next week. __________   [facility]  04/13/2022:   --------Severe 3 Vessel CAD--------------   Small caliber-codominant RCA: Prox RCA-1 lesion is 70% stenosis tapers to Prox RCA-2 lesion is 99% stenosed. (Likely Culprit Lesion #1)   Codominant circumflex:  Prox Cx lesion is 55% stenosed.  1st Mrg lesion is 70% stenosed. Just past 1st Mrf, Mid Cx to Dist Cx lesion is 80% stenosed with 70% stenosed side branch in 2nd Mrg.   Prox LAD to Mid LAD lesion is 80% stenosed - focal/concentric.  Mid LAD to Dist LAD long stented segment is 70% stenosed. In-stent restenosis. Dist LAD lesion is 95% stenosed -> involving the distal stent edge into native vessel.   -------------------------------------------   LV end diastolic pressure is moderately elevated.   There is no aortic valve stenosis.     FOR FUTURE CARDIAC CATHETERIZATION, WOULD NOT RECOMMEND USING RIGHT RADIAL ACCESS DUE TO THE EXTREME TORTUOSITY OF THE SUBCLAVIAN-INNOMINATE ARTERY AND AORTIC ARCH.   PLAN OF CARE:  Continue with plan to admit overnight.  Will restart IV heparin 2 hours after sheath removal. \            Will need to meet with patient and family as well as have a heart team discussion with interventional cardiology and CVTS to determine best treatment options.  If he were to need PCI, may be best for him to be transferred to [facility] as the procedure would like to be prolonged and difficult.Potential needs to be noted to the patient's mental status, and baseline functional status. __________   2D echo 04/13/2022: 1. Left ventricular ejection fraction, by estimation, is 55 to 60%. The  left ventricle has normal function. The left ventricle demonstrates  regional wall motion abnormalities (hypokinesis of teh basal  septal  region). There is mild left ventricular  hypertrophy. Left ventricular diastolic parameters are consistent with  Grade I diastolic dysfunction (impaired relaxation).   2. Right ventricular systolic function is normal. The right ventricular  size is  normal. There is normal pulmonary artery systolic pressure. The  estimated right ventricular systolic pressure is 123456 mmHg.   3. The mitral valve is normal in structure. No evidence of mitral valve  regurgitation. No evidence of mitral stenosis.   4. The aortic valve is normal in structure. Aortic valve regurgitation is  mild. Aortic valve sclerosis is present, with no evidence of aortic valve  stenosis.   5. The inferior vena cava is normal in size with greater than 50%  respiratory variability, suggesting right atrial pressure of 3 mmHg.  __________   Carotid artery ultrasound 12/06/2018: Summary:  Right Carotid: Velocities in the right ICA are consistent with a 1-39%  stenosis.                Non-hemodynamically significant plaque <50% noted in the  CCA. The ECA appears <50% stenosed.   Left Carotid: Velocities in the left ICA are consistent with a 1-39%  stenosis.               Non-hemodynamically significant plaque <50% noted in the  CCA. The ECA appears >50% stenosed.   Vertebrals:  Bilateral vertebral arteries demonstrate antegrade flow.  Subclavians: Normal flow hemodynamics were seen in bilateral subclavian arteries.  __________   Elwyn Reach patch 05/2016: Normal sinus rhythm Min HR of 40 bpm, max HR of 171 bpm, and avg HR of 69 bpm.    7 Atrial tachycardia runs occurred, the longest lasting 7 beats with an avg rate of 105 bpm.   Isolated SVEs were rare (<1.0%), SVE Couplets were rare (<1.0%), and SVE Triplets were rare (<1.0%).  Isolated VEs were rare (<1.0%), VE Couplets were rare (<1.0%), and no VE Triplets were present. __________   [facility] 05/02/2016: Pharmacological myocardial perfusion imaging study with no significant  Ischemia Small region of predominantly fixed defect in the anteroapical wall of mild severity Normal wall motion (GI uptake artifact noted),  EF estimated at 59% No EKG changes concerning for ischemia at peak stress or in recovery. Severe  hypertension noted, SBP 180 Low risk scan   Laboratory Data:  Chemistry Recent Labs  Lab 06/19/22 1145 06/20/22 0425  NA 135 134*  K 4.9 4.3  CL 97* 98  CO2 25 24  GLUCOSE 231* 171*  BUN 21 25*  CREATININE 1.29* 1.17  CALCIUM 8.8* 8.7*  GFRNONAA 56* >60  ANIONGAP 13 12    Recent Labs  Lab 06/19/22 1145 06/20/22 0425  PROT 6.7 6.5  ALBUMIN 3.4* 3.4*  AST 39 31  ALT 20 21  ALKPHOS 73 62  BILITOT 0.7 0.9   Hematology Recent Labs  Lab 06/19/22 1145 06/19/22 1925 06/19/22 2329 06/20/22 0425  WBC 15.7*  --   --   --   RBC 3.24*  --   --   --   HGB 9.8* 8.4* 8.1* 8.8*  HCT 30.9* 25.6* 24.4* 27.3*  MCV 95.4  --   --   --   MCH 30.2  --   --   --   MCHC 31.7  --   --   --   RDW 13.8  --   --   --   PLT 317  --   --   --    Cardiac EnzymesNo results  for input(s): "TROPONINI" in the last 168 hours. No results for input(s): "TROPIPOC" in the last 168 hours.  BNPNo results for input(s): "BNP", "PROBNP" in the last 168 hours.  DDimer No results for input(s): "DDIMER" in the last 168 hours.  Radiology/Studies:  CT CHEST ABDOMEN PELVIS W CONTRAST  Result Date: 06/19/2022 IMPRESSION: 1. Multiple right posterior lower rib fractures from rib 8 through 12. The ninth, tenth, and eleventh rib fractures are displaced and segmental. There is also fracture of the right T11 transverse process. 2. Small right hemothorax with adjacent atelectasis or contusion in the right lower lobe. No pneumothorax. 3. Large intramuscular hematoma within the right paraspinal and posterior flank musculature. 4. No evidence of acute traumatic injury to the abdomen or pelvis. 5. Irregular nodular density in the basilar left lower lobe persists, however is slightly decreased in size from prior exam. It is unclear if decrease in size is due to differences in slice selection or screw decrease in size from prior exam. Recommend additional follow-up chest CT or PET CT in 3 months. Aortic Atherosclerosis  (ICD10-I70.0) and Emphysema (ICD10-J43.9). Electronically Signed   By: Keith Rake M.D.   On: 06/19/2022 15:13   DG Chest 2 View  Result Date: 06/19/2022 IMPRESSION: 1. No acute cardiopulmonary disease. 2. Persistent posterior left lower lobe nodule as seen on prior CT. As per prior recommendations, follow-up noncontrast chest CT, PET-CT, or tissue sampling is recommended now, unless the patient has significant comorbidities. Electronically Signed   By: Titus Dubin M.D.   On: 06/19/2022 11:34    Assessment and Plan:   1.  Mechanical fall with traumatic multiple rib fractures, small thorax, and intramuscular hematoma -No evidence of syncope or cardiac decompensation precipitating this event -Appears to be mechanical fall, slipping in the bathtub as his wife reports the patient requires a large amount of soapsuds -We will undergo a trial of resuming aspirin 81 mg daily, checking Hgb/Hct later this afternoon -Would advocate for resumption of clopidogrel as soon as safely possible in an effort to minimize risk for in-stent restenosis -Recommend regular use of incentive spirometry -Consider repeat chest x-ray given right hemothorax, will defer to internal medicine  2.  Multivessel CAD involving native coronary arteries without angina: -No symptoms concerning for angina or cardiac decompensation -Resume aspirin as outlined above with recommendation to resume clopidogrel as soon as safely possible -PTA lisinopril, metoprolol, and atorvastatin -No plans for inpatient ischemic evaluation at this time  3.  Atrial tachycardia: -Quiescent on metoprolol  4.  HTN: -Blood pressure reasonably controlled -PTA lisinopril and metoprolol  5.  History of TIA/CVA: -No new symptoms -Aspirin and atorvastatin as outlined above  6.  Anemia: -Baseline hemoglobin around 11-12 -Hemoglobin currently stable at 8.8 -Transfuse as indicated to maintain hemoglobin greater than 8 -Monitor closely with  resumption of aspirin         For questions or updates, please contact [place] Please consult [email or web address] for contact info under Cardiology/STEMI.   Signed, Christell Faith, PA-C [facility] Pager: [phone] 06/20/2022, 7:47 AM

## 2022-06-20 NOTE — Evaluation (Signed)
Occupational Therapy Evaluation Patient Details Name: Raymond Sampson MRN: [id number] DOB: [birth year] Today's Date: 06/20/2022   History of Present Illness Raymond Sampson is an 42yoM who comes to [facility] on 06/19/22 after a fall in shower at home, hit Rt flank, orthostatic upon arrival to ED 128/66 sitting to 94/60 standing. PMH: CAD, CVA, DM, HTN. Chest CT shows 'large intramuscular hematoma' with the Rt paraspinals. ABD CT revealing fracture of Rt ribs 8-12, (9-12 are displaced), fracture of T11 Rt transverse process, and 'small right hemothorax with adjacent atelectasis or contusion in the right lower lobe.'   Clinical Impression   Raymond Sampson was seen for OT evaluation this date. Prior to hospital admission, pt was IND. Pt lives with family. Pt presents to acute OT demonstrating impaired ADL performance and functional mobility 2/2 decreased activity tolerance and functional strength/ROM/balance deficits. A&O x4, reports pain with mobility but improved at rest. Oxygen removed upon entry, SpO2 84% in sitting, resolved to 91% with 3L [place] donned.   Pt currently requires MAX A toileting at bed level, +2 assist for pericare in sidelying. SETUP + SBA seated grooming tasks. MOD A sit<>stand at EOB, MIN A scooting along EOB. Pt would benefit from skilled OT to address noted impairments and functional limitations (see below for any additional details). Upon hospital discharge, recommend STR to maximize pt safety and return to PLOF.    Recommendations for follow up therapy are one component of a multi-disciplinary discharge planning process, led by the attending physician.  Recommendations may be updated based on patient status, additional functional criteria and insurance authorization.   Follow Up Recommendations  Skilled nursing-short term rehab (<3 hours/day)     Assistance Recommended at Discharge Frequent or constant Supervision/Assistance  Patient can return home with the following Two people to help  with walking and/or transfers;Two people to help with bathing/dressing/bathroom;Help with stairs or ramp for entrance    Functional Status Assessment  Patient has had a recent decline in their functional status and demonstrates the ability to make significant improvements in function in a reasonable and predictable amount of time.  Equipment Recommendations  Other (comment) (defer)    Recommendations for Other Services       Precautions / Restrictions Precautions Precautions: Fall Precaution Comments: rib fx Restrictions Weight Bearing Restrictions: No      Mobility Bed Mobility Overal bed mobility: Needs Assistance Bed Mobility: Supine to Sit, Sit to Supine     Supine to sit: Max assist Sit to supine: Total assist        Transfers Overall transfer level: Needs assistance   Transfers: Sit to/from Stand, Bed to chair/wheelchair/BSC Sit to Stand: Mod assist, From elevated surface          Lateral/Scoot Transfers: Min assist General transfer comment: MIN A scooting along EOB. MAX A sit<.stand 1 strial, MOD A 2nd trial      Balance Overall balance assessment: Needs assistance Sitting-balance support: Feet supported, Bilateral upper extremity supported Sitting balance-Leahy Scale: Fair     Standing balance support: Bilateral upper extremity supported, Reliant on assistive device for balance Standing balance-Leahy Scale: Poor                             ADL either performed or assessed with clinical judgement   ADL Overall ADL's : Needs assistance/impaired  General ADL Comments: MAX A toileting at bed level. SETUP + SBA seated grooming tasks      Pertinent Vitals/Pain Pain Assessment Pain Assessment: Faces Faces Pain Scale: Hurts whole lot Breathing: occasional labored breathing, short period of hyperventilation Negative Vocalization: occasional moan/groan, low speech, negative/disapproving  quality Facial Expression: sad, frightened, frown Body Language: tense, distressed pacing, fidgeting Consolability: distracted or reassured by voice/touch PAINAD Score: 5 Pain Location: R flank Pain Descriptors / Indicators: Discomfort, Dull, Grimacing, Aching, Constant Pain Intervention(s): Limited activity within patient's tolerance, Repositioned     Hand Dominance Right   Extremity/Trunk Assessment Upper Extremity Assessment Upper Extremity Assessment: Generalized weakness   Lower Extremity Assessment Lower Extremity Assessment: Generalized weakness       Communication Communication Communication: No difficulties   Cognition Arousal/Alertness: Awake/alert Behavior During Therapy: WFL for tasks assessed/performed Overall Cognitive Status: History of cognitive impairments - at baseline                                 General Comments: orietned to self, location , year, and situation.                Home Living Family/patient expects to be discharged to:: Private residence Living Arrangements: Spouse/significant other Available Help at Discharge: Family;Available 24 hours/day Type of Home: House Home Access: Stairs to enter [employer] of Steps: 3 Entrance Stairs-Rails: None Home Layout: One level     Bathroom Shower/Tub: Tub/shower unit         Home Equipment: Conservation officer, nature (2 wheels)          Prior Functioning/Environment Prior Level of Function : Independent/Modified Independent             Mobility Comments: amb with no AD, currently active with cardiac rehab x3 weeks ADLs Comments: independent with ADL        OT Problem List: Decreased strength;Decreased range of motion;Decreased activity tolerance;Impaired balance (sitting and/or standing);Decreased safety awareness      OT Treatment/Interventions: Self-care/ADL training;Therapeutic exercise;Energy conservation;DME and/or AE instruction;Therapeutic  activities;Patient/family education;Balance training    OT Goals(Current goals can be found in the care plan section) Acute Rehab OT Goals Patient Stated Goal: to stand OT Goal Formulation: With patient Time For Goal Achievement: 07/04/22 Potential to Achieve Goals: Good ADL Goals Pt Will Perform Grooming: with modified independence;sitting Pt Will Perform Lower Body Dressing: sit to/from stand;with min assist Pt Will Transfer to Toilet: with min assist;stand pivot transfer;bedside commode  OT Frequency: Min 2X/week    Co-evaluation              AM-PAC OT "6 Clicks" Daily Activity     Outcome Measure Help from another person eating meals?: A Little Help from another person taking care of personal grooming?: A Little Help from another person toileting, which includes using toliet, bedpan, or urinal?: A Lot Help from another person bathing (including washing, rinsing, drying)?: A Lot Help from another person to put on and taking off regular upper body clothing?: A Little Help from another person to put on and taking off regular lower body clothing?: A Lot 6 Click Score: 15   End of Session Nurse Communication: Mobility status  Activity Tolerance: Patient tolerated treatment well Patient left: in bed;with call bell/phone within reach;with bed alarm set  OT Visit Diagnosis: Other abnormalities of gait and mobility (R26.89);Muscle weakness (generalized) (M62.81)  Time: 1445-1500 OT Time Calculation (min): 15 min Charges:  OT General Charges $OT Visit: 1 Visit OT Evaluation $OT Eval Moderate Complexity: 1 Mod  Dessie Coma, M.S. OTR/L  06/20/22, 3:51 PM  ascom [phone]

## 2022-06-20 NOTE — Progress Notes (Addendum)
Progress Note    Raymond Sampson  [id number] DOB: 12-31-1940  DOA: 06/19/2022 PCP: [name], MD      Brief Narrative:    Medical records reviewed and are as summarized below:  Raymond Sampson is a 82 y.o. male with medical history significant of coronary artery disease status post multiple stenting's with most recent stenting January 2024 x 2, carotid disease, CVA, type 2 diabetes, hyperlipidemia, hypertension, who presented to the hospital after a fall at home.  Reportedly, he slipped on soap in the tub and landed on his right side.  Workup revealed multiple right rib fractures (8 to 12), intramuscular hematoma within the right paraspinal and posterior flank musculature and small right hemothorax.      Assessment/Plan:   Principal Problem:   Intramuscular hematoma Active Problems:   Rib fractures   Status post coronary artery stent placement   Hyperlipidemia   Essential hypertension   Diabetes mellitus type 2, controlled, with complications (HCC)   Hemothorax   Leukocytosis   Multiple rib fractures involving four or more ribs    Body mass index is 24.03 kg/m.   Multiple right rib fractures (8th through 12th), intramuscular hematoma within the right paraspinal and posterior flank musculature, and small right hemothorax, s/p fall: Analgesics as needed for pain.  Repeat chest x-ray to reevaluate small right hemothorax.   Acute blood loss anemia: Hemoglobin dropped from 9.8-8.8.  No indication for blood transfusion at this time.  Monitor H&H closely and transfuse as needed.   Acute hypoxic respiratory failure: Oxygen saturation dropped to 84 to 87% on room air while working with PT.  2 L/min oxygen via nasal cannula and wean off oxygen as able   CAD s/p coronary stent in January 2024, history of stroke: Continue aspirin.  Plavix has been held because of hemothorax.   Type II DM with hyperglycemia: NovoLog as needed.  Metformin and Januvia have  been held.   Diet Order             Diet heart healthy/carb modified Room service appropriate? Yes; Fluid consistency: Thin  Diet effective now                            Consultants: Cardiologist  Procedures: None    Medications:    aspirin EC  81 mg Oral Daily   atorvastatin  80 mg Oral Daily   insulin aspart  0-5 Units Subcutaneous QHS   insulin aspart  0-9 Units Subcutaneous TID WC   lisinopril  20 mg Oral Daily   metoprolol tartrate  25 mg Oral BID   ondansetron (ZOFRAN) IV  4 mg Intravenous Once   pantoprazole  20 mg Oral Daily   sertraline  25 mg Oral Daily   Continuous Infusions:  sodium chloride 50 mL/hr at 06/20/22 1056     Anti-infectives (From admission, onward)    None              Family Communication/Anticipated D/C date and plan/Code Status   DVT prophylaxis: SCDs Start: 06/19/22 1719     Code Status: Full Code  Family Communication: Plan discussed with his wife at the bedside Disposition Plan: Plan to discharge home in 2 to 3 days   Status is: Observation The patient will require care spanning > 2 midnights and should be moved to inpatient because: Multiple rib fractures, small right hemothorax following fall at home.  May need SNF  at discharge       Subjective:   Interval events noted.  He complains of pain on the lateral aspect right chest wall that is worse with movement.  He is more comfortable at rest.  No anterior or midsternal chest pain, no shortness of breath.  His wife is at the bedside.  His wife said patient has been a little confused since admission.  Objective:    Vitals:   06/19/22 2124 06/19/22 2203 06/20/22 0639 06/20/22 0824  BP: (!) 140/62 126/63 (!) 152/60 (!) 142/61  Pulse: (!) 107 (!) 102 91 94  Resp: 15 17 19 18   Temp: 98.4 F (36.9 C) 98.6 F (37 C) 97.8 F (36.6 C) 97.8 F (36.6 C)  TempSrc: Oral   Axillary  SpO2: 96% 94% 91% 92%  Weight: 69.6 kg     Height: 5\' 7"  (1.702 m)       No data found.   Intake/Output Summary (Last 24 hours) at 06/20/2022 1325 Last data filed at 06/20/2022 1015 Gross per 24 hour  Intake --  Output 300 ml  Net -300 ml   Filed Weights   06/19/22 2124  Weight: 69.6 kg    Exam:   GEN: NAD SKIN: Bruises/ecchymosis on right side of the back EYES: EOMI ENT: MMM CV: RRR PULM: CTA B ABD: soft, ND, NT, +BS CNS: AAO x 3, non focal EXT: No edema or tenderness MSK: Right lateral chest wall tenderness      Data Reviewed:   I have personally reviewed following labs and imaging studies:  Labs: Labs show the following:   Basic Metabolic Panel: Recent Labs  Lab 06/19/22 1145 06/20/22 0425  NA 135 134*  K 4.9 4.3  CL 97* 98  CO2 25 24  GLUCOSE 231* 171*  BUN 21 25*  CREATININE 1.29* 1.17  CALCIUM 8.8* 8.7*   GFR Estimated Creatinine Clearance: 46.3 mL/min (by C-G formula based on SCr of 1.17 mg/dL). Liver Function Tests: Recent Labs  Lab 06/19/22 1145 06/20/22 0425  AST 39 31  ALT 20 21  ALKPHOS 73 62  BILITOT 0.7 0.9  PROT 6.7 6.5  ALBUMIN 3.4* 3.4*   No results for input(s): "LIPASE", "AMYLASE" in the last 168 hours. No results for input(s): "AMMONIA" in the last 168 hours. Coagulation profile No results for input(s): "INR", "PROTIME" in the last 168 hours.  CBC: Recent Labs  Lab 06/19/22 1145 06/19/22 1925 06/19/22 2329 06/20/22 0425  WBC 15.7*  --   --   --   HGB 9.8* 8.4* 8.1* 8.8*  HCT 30.9* 25.6* 24.4* 27.3*  MCV 95.4  --   --   --   PLT 317  --   --   --    Cardiac Enzymes: No results for input(s): "CKTOTAL", "CKMB", "CKMBINDEX", "TROPONINI" in the last 168 hours. BNP (last 3 results) No results for input(s): "PROBNP" in the last 8760 hours. CBG: Recent Labs  Lab 06/19/22 2200 06/20/22 0928 06/20/22 1218  GLUCAP 193* 193* 178*   D-Dimer: No results for input(s): "DDIMER" in the last 72 hours. Hgb A1c: No results for input(s): "HGBA1C" in the last 72 hours. Lipid  Profile: No results for input(s): "CHOL", "HDL", "LDLCALC", "TRIG", "CHOLHDL", "LDLDIRECT" in the last 72 hours. Thyroid function studies: No results for input(s): "TSH", "T4TOTAL", "T3FREE", "THYROIDAB" in the last 72 hours.  Invalid input(s): "FREET3" Anemia work up: No results for input(s): "VITAMINB12", "FOLATE", "FERRITIN", "TIBC", "IRON", "RETICCTPCT" in the last 72 hours. Sepsis Labs: Recent Labs  Lab 06/19/22 1145  WBC 15.7*    Microbiology No results found for this or any previous visit (from the past 240 hour(s)).  Procedures and diagnostic studies:  CT CHEST ABDOMEN PELVIS W CONTRAST  Result Date: 06/19/2022 CLINICAL DATA:  Blunt trauma. Fall at home in the shower. Right flank pain. EXAM: CT CHEST, ABDOMEN, AND PELVIS WITH CONTRAST TECHNIQUE: Multidetector CT imaging of the chest, abdomen and pelvis was performed following the standard protocol during bolus administration of intravenous contrast. RADIATION DOSE REDUCTION: This exam was performed according to the departmental dose-optimization program which includes automated exposure control, adjustment of the mA and/or kV according to patient size and/or use of iterative reconstruction technique. CONTRAST:  127mL OMNIPAQUE IOHEXOL 300 MG/ML  SOLN COMPARISON:  Chest radiograph earlier today. Chest CT 04/12/2022 FINDINGS: CT CHEST FINDINGS Cardiovascular: No evidence of acute aortic or vascular injury. Moderate aortic atherosclerosis and tortuosity coronary artery calcifications versus stents. The heart is normal in size. No pericardial effusion. Mediastinum/Nodes: Mediastinal hemorrhage or hematoma. No pneumomediastinum. Small mediastinal lymph nodes are all subcentimeter short axis, for example lower paratracheal node measures 7 mm series 2, image 19. Prominent right hilar node, series 2, image 24, partially motion obscured but similar to prior chest CT. No esophageal wall thickening. No visible thyroid nodule. Lungs/Pleura: No  pneumothorax. Small right hemothorax with adjacent atelectasis or contusion in the right lower lobe. Background emphysema. Irregular nodular density in the basilar left lower lobe, series 4, image 99 measures 1.7 x 1.2 cm, previously 2.5 x 1.1 cm. Improvement but persistent heterogeneous ground-glass opacities. No endobronchial lesion. Musculoskeletal: Multiple posterior lower rib fractures. There are fractures of the right posterior eighth through twelfth ribs; the ninth, tenth, and eleventh rib fractures are displaced and segmental. There is also fracture of the right T11 transverse process. No fracture extension into the vertebral body, middle or anterior columns. No left rib fractures. The sternum, included clavicles and shoulder girdles are intact. Large intramuscular hematoma within the right paraspinal as well as posterior flank musculature. Overlying subcutaneous edema. CT ABDOMEN PELVIS FINDINGS Hepatobiliary: Despite overlying right rib fractures, there is no evidence of hepatic injury or perihepatic hematoma. No focal liver abnormality. Mild gallbladder distention without calcified gallstone or pericholecystic inflammation. Common bile duct measures 7 mm, likely normal for age. Pancreas: No evidence of injury. No ductal dilatation or inflammation. Spleen: No splenic injury or perisplenic hematoma. Adrenals/Urinary Tract: 17 mm right adrenal nodule with Hounsfield units of 38, however on prior exam this with low density and consistent with benign adenoma. No evidence of adrenal hemorrhage. Slight thickening of the right pararenal fascia of but no renal injury. No pararenal hematoma. Early excretion of IV contrast in both renal collecting systems. No suspicious renal lesion. The urinary bladder is partially distended, no bladder injury. Stomach/Bowel: No evidence of bowel injury or mesenteric hematoma. No bowel wall thickening or inflammation. No obstruction. Moderate volume of stool throughout the colon.  Moderate rectal distention with stool. Occasional left colonic diverticula without diverticulitis. Vascular/Lymphatic: No vascular injury. No retroperitoneal fluid. Irregular calcified and noncalcified atheromatous plaque throughout the abdominal aorta. Patent portal vein. No abdominopelvic adenopathy. Reproductive: Prostate is unremarkable. Other: Subcutaneous contusion involving the right flank. There is also intramuscular hematoma involving the posterior flank musculature. No intra-abdominal hematoma or free air. Musculoskeletal: No acute fracture of the lumbar spine or pelvis. L5 is sacralized. The pubic symphysis is fused. There is a bone island in the right iliac bone. IMPRESSION: 1. Multiple right posterior lower rib fractures  from rib 8 through 12. The ninth, tenth, and eleventh rib fractures are displaced and segmental. There is also fracture of the right T11 transverse process. 2. Small right hemothorax with adjacent atelectasis or contusion in the right lower lobe. No pneumothorax. 3. Large intramuscular hematoma within the right paraspinal and posterior flank musculature. 4. No evidence of acute traumatic injury to the abdomen or pelvis. 5. Irregular nodular density in the basilar left lower lobe persists, however is slightly decreased in size from prior exam. It is unclear if decrease in size is due to differences in slice selection or screw decrease in size from prior exam. Recommend additional follow-up chest CT or PET CT in 3 months. Aortic Atherosclerosis (ICD10-I70.0) and Emphysema (ICD10-J43.9). Electronically Signed   By: Keith Rake M.D.   On: 06/19/2022 15:13   DG Chest 2 View  Result Date: 06/19/2022 CLINICAL DATA:  Right flank pain after injury in the shower. EXAM: CHEST - 2 VIEW COMPARISON:  CT chest and chest x-ray dated April 12, 2022. FINDINGS: The heart size and mediastinal contours are within normal limits. Normal pulmonary vascularity. Density in the posterior left lower  lobe persists as seen on prior CT. Mild elevation of the right hemidiaphragm with minimal right basilar atelectasis. No pleural effusion or pneumothorax. No acute osseous abnormality. IMPRESSION: 1. No acute cardiopulmonary disease. 2. Persistent posterior left lower lobe nodule as seen on prior CT. As per prior recommendations, follow-up noncontrast chest CT, PET-CT, or tissue sampling is recommended now, unless the patient has significant comorbidities. Electronically Signed   By: Titus Dubin M.D.   On: 06/19/2022 11:34               LOS: 0 days   Kamron Portee  [facility]   Pager on [email or web address]. If 7PM-7AM, please contact night-coverage at [email or web address]     06/20/2022, 1:25 PM

## 2022-06-20 NOTE — Evaluation (Signed)
Physical Therapy Evaluation Patient Details Name: Raymond Sampson MRN: [id number] DOB: [birth year] Today's Date: 06/20/2022  History of Present Illness  Raymond Sampson is an 27yoM who comes to [facility] on 06/19/22 after a fall in shower at home, hit Rt flank, orthostatic upon arrival to ED 128/66 sitting to 94/60 standing. PMH: CAD, CVA, DM, HTN. Chest CT shows 'large intramuscular hematoma' with the Rt paraspinals. ABD CT revealing fracture of Rt ribs 8-12, (9-12 are displaced), fracture of T11 Rt transverse process, and 'small right hemothorax with adjacent atelectasis or contusion in the right lower lobe.'  Clinical Impression  Pt in bed on entry, awake, somewhat interactive, but essentially flat and with mild delayed response which makes it easy for wife to answer questions before he is able to. Later in session, his ability to attend to task and follow cues for basic movements is revealed in full to be quite impaired. Sats show 84-86% on room air, but mentation does not improve after author places 3L via [place] and sats >95%. Pt is unable to come to EOB from flat bed even with assist from wife which would indicate a DC straight to home being met with high risk of PNA, bed sores, emboli. We discussed the utility of a STR stay for rapid rehabilitation of function. Pt assisted to EOB totalA+1, but he is never able to sit unsupported, unclear whether mentation or pain is the biggest factor, although pain was a major deterrent for other assists in mobility. Wife agrees she is unable to provide the degree of physical assistance that he needs at present. Pt has not had pain meds orders in, but anticipate need for better pain control to improve ability to tolerate basic mobility. Relayed messages regarding pain meds, mentation, and sats to RN. Will continue to follow.      Recommendations for follow up therapy are one component of a multi-disciplinary discharge planning process, led by the attending physician.   Recommendations may be updated based on patient status, additional functional criteria and insurance authorization.  Follow Up Recommendations Skilled nursing-short term rehab (<3 hours/day) Can patient physically be transported by private vehicle: No    Assistance Recommended at Discharge Intermittent Supervision/Assistance  Patient can return home with the following  Two people to help with walking and/or transfers;Two people to help with bathing/dressing/bathroom;Assistance with cooking/housework;Help with stairs or ramp for entrance;Assist for transportation;Direct supervision/assist for medications management    Equipment Recommendations None recommended by PT  Recommendations for Other Services       Functional Status Assessment Patient has had a recent decline in their functional status and demonstrates the ability to make significant improvements in function in a reasonable and predictable amount of time.     Precautions / Restrictions Precautions Precautions: Fall Precaution Comments: rib fx Restrictions Weight Bearing Restrictions: No      Mobility  Bed Mobility Overal bed mobility: Needs Assistance Bed Mobility: Supine to Sit, Sit to Supine     Supine to sit: Total assist, +2 for physical assistance Sit to supine: Total assist, +2 for physical assistance   General bed mobility comments: unable to obtain short sitting EOB steady without constant maxA of trunk    Transfers                        Ambulation/Gait                  Stairs  Wheelchair Mobility    Modified Rankin (Stroke Patients Only)       Balance                                             Pertinent Vitals/Pain Pain Assessment Pain Assessment: Faces Faces Pain Scale: Hurts worst Pain Location: does not rate or describe but pain in incapacitating Pain Intervention(s): Limited activity within patient's tolerance, Repositioned     Home Living Family/patient expects to be discharged to:: Private residence Living Arrangements: Spouse/significant other Available Help at Discharge: Family;Available 24 hours/day Type of Home: House Home Access: Stairs to enter Entrance Stairs-Rails: None Entrance Stairs-Number of Steps: 3   Home Layout: One level Home Equipment: Conservation officer, nature (2 wheels)      Prior Function Prior Level of Function : Independent/Modified Independent             Mobility Comments: amb with no AD, currently active with cardiac rehab x3 weeks ADLs Comments: independent with ADL     Hand Dominance   Dominant Hand: Right    Extremity/Trunk Assessment                Communication      Cognition Arousal/Alertness:  (confused, altered, flat)                                              General Comments      Exercises General Exercises - Lower Extremity Heel Slides: AAROM, Both, 10 reps, Seated, Limitations Heel Slides Limitations: congitive limitations   Assessment/Plan    PT Assessment Patient needs continued PT services  PT Problem List Decreased strength;Decreased range of motion;Decreased activity tolerance;Decreased balance;Decreased mobility;Decreased coordination;Decreased cognition;Decreased knowledge of use of DME;Decreased safety awareness;Decreased knowledge of precautions;Impaired sensation;Impaired tone       PT Treatment Interventions DME instruction    PT Goals (Current goals can be found in the Care Plan section)  Acute Rehab PT Goals Patient Stated Goal: return to mobility PT Goal Formulation: With patient Time For Goal Achievement: 07/04/22 Potential to Achieve Goals: Fair    Frequency 7X/week     Co-evaluation               AM-PAC PT "6 Clicks" Mobility  Outcome Measure Help needed turning from your back to your side while in a flat bed without using bedrails?: Total Help needed moving from lying on your back to  sitting on the side of a flat bed without using bedrails?: Total Help needed moving to and from a bed to a chair (including a wheelchair)?: Total Help needed standing up from a chair using your arms (e.g., wheelchair or bedside chair)?: Total Help needed to walk in hospital room?: Total Help needed climbing 3-5 steps with a railing? : Total 6 Click Score: 6    End of Session Equipment Utilized During Treatment: Oxygen Activity Tolerance: Patient limited by pain;Other (comment) (AMS)   Nurse Communication: Mobility status (o2 needs) PT Visit Diagnosis: Unsteadiness on feet (R26.81);Other abnormalities of gait and mobility (R26.89);Repeated falls (R29.6);Muscle weakness (generalized) (M62.81)    Time: [id number] PT Time Calculation (min) (ACUTE ONLY): 35 min   Charges:   PT Evaluation $PT Eval Moderate Complexity: 1 Mod PT Treatments $Therapeutic Activity: 8-22 mins  12:37 PM, 06/20/22 Etta Grandchild, PT, DPT Physical Therapist - [facility]  [phone] ([place])    [place] C 06/20/2022, 12:31 PM

## 2022-06-21 ENCOUNTER — Encounter: Payer: Self-pay | Admitting: *Deleted

## 2022-06-21 ENCOUNTER — Telehealth: Payer: Self-pay | Admitting: *Deleted

## 2022-06-21 ENCOUNTER — Inpatient Hospital Stay: Payer: PPO

## 2022-06-21 DIAGNOSIS — W19XXXA Unspecified fall, initial encounter: Secondary | ICD-10-CM | POA: Diagnosis not present

## 2022-06-21 DIAGNOSIS — Z955 Presence of coronary angioplasty implant and graft: Secondary | ICD-10-CM

## 2022-06-21 DIAGNOSIS — I214 Non-ST elevation (NSTEMI) myocardial infarction: Secondary | ICD-10-CM

## 2022-06-21 DIAGNOSIS — I2510 Atherosclerotic heart disease of native coronary artery without angina pectoris: Secondary | ICD-10-CM | POA: Diagnosis not present

## 2022-06-21 DIAGNOSIS — D62 Acute posthemorrhagic anemia: Secondary | ICD-10-CM

## 2022-06-21 DIAGNOSIS — T148XXA Other injury of unspecified body region, initial encounter: Secondary | ICD-10-CM | POA: Diagnosis not present

## 2022-06-21 DIAGNOSIS — J942 Hemothorax: Secondary | ICD-10-CM | POA: Diagnosis not present

## 2022-06-21 DIAGNOSIS — S2249XA Multiple fractures of ribs, unspecified side, initial encounter for closed fracture: Secondary | ICD-10-CM | POA: Diagnosis not present

## 2022-06-21 LAB — CBC
HCT: 23.7 % — ABNORMAL LOW (ref 39.0–52.0)
Hemoglobin: 7.9 g/dL — ABNORMAL LOW (ref 13.0–17.0)
MCH: 30.6 pg (ref 26.0–34.0)
MCHC: 33.3 g/dL (ref 30.0–36.0)
MCV: 91.9 fL (ref 80.0–100.0)
Platelets: 226 10*3/uL (ref 150–400)
RBC: 2.58 MIL/uL — ABNORMAL LOW (ref 4.22–5.81)
RDW: 13.8 % (ref 11.5–15.5)
WBC: 9.7 10*3/uL (ref 4.0–10.5)
nRBC: 0 % (ref 0.0–0.2)

## 2022-06-21 LAB — CBC WITH DIFFERENTIAL/PLATELET
Abs Immature Granulocytes: 0.06 10*3/uL (ref 0.00–0.07)
Basophils Absolute: 0 10*3/uL (ref 0.0–0.1)
Basophils Relative: 0 %
Eosinophils Absolute: 0 10*3/uL (ref 0.0–0.5)
Eosinophils Relative: 0 %
HCT: 20.7 % — ABNORMAL LOW (ref 39.0–52.0)
Hemoglobin: 6.7 g/dL — ABNORMAL LOW (ref 13.0–17.0)
Immature Granulocytes: 1 %
Lymphocytes Relative: 11 %
Lymphs Abs: 1.2 10*3/uL (ref 0.7–4.0)
MCH: 30.2 pg (ref 26.0–34.0)
MCHC: 32.4 g/dL (ref 30.0–36.0)
MCV: 93.2 fL (ref 80.0–100.0)
Monocytes Absolute: 1.2 10*3/uL — ABNORMAL HIGH (ref 0.1–1.0)
Monocytes Relative: 11 %
Neutro Abs: 8.1 10*3/uL — ABNORMAL HIGH (ref 1.7–7.7)
Neutrophils Relative %: 77 %
Platelets: 235 10*3/uL (ref 150–400)
RBC: 2.22 MIL/uL — ABNORMAL LOW (ref 4.22–5.81)
RDW: 14.1 % (ref 11.5–15.5)
WBC: 10.5 10*3/uL (ref 4.0–10.5)
nRBC: 0 % (ref 0.0–0.2)

## 2022-06-21 LAB — GLUCOSE, CAPILLARY
Glucose-Capillary: 169 mg/dL — ABNORMAL HIGH (ref 70–99)
Glucose-Capillary: 178 mg/dL — ABNORMAL HIGH (ref 70–99)
Glucose-Capillary: 149 mg/dL — ABNORMAL HIGH (ref 70–99)

## 2022-06-21 LAB — URINALYSIS, COMPLETE (UACMP) WITH MICROSCOPIC
Bilirubin Urine: NEGATIVE
Glucose, UA: NEGATIVE mg/dL
Hgb urine dipstick: NEGATIVE
Ketones, ur: NEGATIVE mg/dL
Leukocytes,Ua: NEGATIVE
Nitrite: NEGATIVE
Protein, ur: 100 mg/dL — AB
Specific Gravity, Urine: 1.029 (ref 1.005–1.030)
pH: 5 (ref 5.0–8.0)

## 2022-06-21 LAB — ABO/RH: ABO/RH(D): O POS

## 2022-06-21 LAB — PREPARE RBC (CROSSMATCH)

## 2022-06-21 MED ORDER — SODIUM CHLORIDE 0.9% IV SOLUTION: Freq: Once | INTRAVENOUS | Status: AC

## 2022-06-21 MED ORDER — ACETAMINOPHEN 325 MG PO TABS
650.0000 mg | ORAL_TABLET | Freq: Once | ORAL | Status: AC
  Administered 2022-06-21: 650 mg via ORAL
  Filled 2022-06-21: qty 2

## 2022-06-21 MED ORDER — ACETAMINOPHEN 325 MG PO TABS
650.0000 mg | ORAL_TABLET | Freq: Four times a day (QID) | ORAL | Status: DC | PRN
  Administered 2022-06-22 – 2022-06-28 (×5): 650 mg via ORAL
  Filled 2022-06-21 (×6): qty 2

## 2022-06-21 NOTE — Progress Notes (Signed)
OT Cancellation Note  Patient Details Name: Raymond Sampson MRN: [id number] DOB: 01/19/1941   Cancelled Treatment:    Reason Eval/Treat Not Completed: Medical issues which prohibited therapy (hemoglobin 6.7; out of therapeutic range).  Vania Rea 06/21/2022, 2:50 PM

## 2022-06-21 NOTE — Progress Notes (Signed)
Progress Note  Patient Name: Raymond Sampson Date of Encounter: 06/21/2022  Primary Cardiologist: Rockey Situ  Subjective   Hgb has trended from 8.8-7.0-5-6.7 most recently.  PRBC pending.  History obtained from patient's wife, she denies the patient complaining of any chest pain.  CT of the chest this morning showed a small to moderate right sized right hemothorax increased from prior study along with near complete right lower lobe atelectasis/consolidation significantly progressed since last study, trace left pleural effusion with new dependent opacities in the left lower lobe and lingula possibly representing atelectasis versus developing pneumonia as well as redemonstration of multiple acute right-sided rib fractures, fracture of the T11 transverse process, posterior paraspinal intramuscular hematoma similar in size to prior study.  Inpatient Medications    Scheduled Meds:  sodium chloride   Intravenous Once   aspirin EC  81 mg Oral Daily   atorvastatin  80 mg Oral Daily   insulin aspart  0-5 Units Subcutaneous QHS   insulin aspart  0-9 Units Subcutaneous TID WC   lisinopril  20 mg Oral Daily   metoprolol tartrate  25 mg Oral BID   ondansetron (ZOFRAN) IV  4 mg Intravenous Once   pantoprazole  20 mg Oral Daily   sertraline  25 mg Oral Daily   Continuous Infusions:  sodium chloride 50 mL/hr at 06/20/22 1056   PRN Meds: acetaminophen, morphine injection, ondansetron **OR** ondansetron (ZOFRAN) IV, oxyCODONE   Vital Signs    Vitals:   06/20/22 1724 06/20/22 2112 06/21/22 0504 06/21/22 0808  BP: (!) 137/54 (!) 120/58 105/66 (!) 167/70  Pulse: 88 97 77 78  Resp: 16 19 19    Temp:  98.7 F (37.1 C) 98.6 F (37 C) 97.9 F (36.6 C)  TempSrc:  Oral  Oral  SpO2: 94% 93% 97% 97%  Weight:      Height:        Intake/Output Summary (Last 24 hours) at 06/21/2022 1050 Last data filed at 06/21/2022 [id number] Gross per 24 hour  Intake --  Output 900 ml  Net -900 ml   Filed Weights    06/19/22 2124  Weight: 69.6 kg    Telemetry    Not on telemetry - Personally Reviewed  ECG    No new tracings - Personally Reviewed  Physical Exam   GEN: No acute distress.   Neck: No JVD. Cardiac: RRR, II/VI systolic murmurs, no rubs, or gallops.  Respiratory: Diminished breath sounds bilaterally, right greater than left, poor respiratory effort.  GI: Soft, nontender, non-distended.   MS: No edema; No deformity.  Hematoma and bruising noted along the right posterior lateral chest extending to the upper flank. Neuro: Somnolent; Nonfocal.  Psych: Somnolent.  Labs    Chemistry Recent Labs  Lab 06/19/22 1145 06/20/22 0425  NA 135 134*  K 4.9 4.3  CL 97* 98  CO2 25 24  GLUCOSE 231* 171*  BUN 21 25*  CREATININE 1.29* 1.17  CALCIUM 8.8* 8.7*  PROT 6.7 6.5  ALBUMIN 3.4* 3.4*  AST 39 31  ALT 20 21  ALKPHOS 73 62  BILITOT 0.7 0.9  GFRNONAA 56* >60  ANIONGAP 13 12     Hematology Recent Labs  Lab 06/19/22 1145 06/19/22 1925 06/20/22 0425 06/20/22 1424 06/21/22 0510  WBC 15.7*  --   --   --  10.5  RBC 3.24*  --   --   --  2.22*  HGB 9.8*   < > 8.8* 7.5* 6.7*  HCT 30.9*   < >  27.3* 23.2* 20.7*  MCV 95.4  --   --   --  93.2  MCH 30.2  --   --   --  30.2  MCHC 31.7  --   --   --  32.4  RDW 13.8  --   --   --  14.1  PLT 317  --   --   --  235   < > = values in this interval not displayed.    Cardiac EnzymesNo results for input(s): "TROPONINI" in the last 168 hours. No results for input(s): "TROPIPOC" in the last 168 hours.   BNPNo results for input(s): "BNP", "PROBNP" in the last 168 hours.   DDimer No results for input(s): "DDIMER" in the last 168 hours.   Radiology    CT CHEST WO CONTRAST  Result Date: 06/21/2022 IMPRESSION: 1. Small-moderate-sized right hemothorax, increased from prior study. Minimal air within the right pleural space. 2. Near-complete right lower lobe atelectasis/consolidation, significantly progressed since prior study. 3. Trace left  pleural effusion with new dependent opacities in the left lower lobe and lingula, which may represent atelectasis or developing pneumonia. 4. Redemonstration of multiple acute right-sided rib fractures including the right eighth through twelfth ribs as well as the right T11 transverse process. The degree of displacement of the right eighth rib fracture has increased since prior study. 5. Posterior paraspinal intramuscular hematoma centered at the posterior right eleventh rib fracture measures approximately 8.0 x 3.4 x 7.5 cm, similar in size to the previous study. 6. Aortic and coronary artery atherosclerosis (ICD10-I70.0). 7. Thoracic aortic arch measures 3.8 cm in diameter. Recommend annual imaging followup by CTA or MRA. This recommendation follows 2010 ACCF/AHA/AATS/ACR/ASA/SCA/SCAI/SIR/STS/SVM Guidelines for the Diagnosis and Management of Patients with Thoracic Aortic Disease. Circulation.2010; [id number]. Aortic aneurysm NOS (ICD10-I71.9) Electronically Signed   By: Davina Poke D.O.   On: 06/21/2022 09:46   DG Chest Port 1 View  Result Date: 06/20/2022 IMPRESSION: Mild bibasilar subsegmental atelectasis. Electronically Signed   By: Marijo Conception M.D.   On: 06/20/2022 14:12   CT CHEST ABDOMEN PELVIS W CONTRAST  Result Date: 06/19/2022 IMPRESSION: 1. Multiple right posterior lower rib fractures from rib 8 through 12. The ninth, tenth, and eleventh rib fractures are displaced and segmental. There is also fracture of the right T11 transverse process. 2. Small right hemothorax with adjacent atelectasis or contusion in the right lower lobe. No pneumothorax. 3. Large intramuscular hematoma within the right paraspinal and posterior flank musculature. 4. No evidence of acute traumatic injury to the abdomen or pelvis. 5. Irregular nodular density in the basilar left lower lobe persists, however is slightly decreased in size from prior exam. It is unclear if decrease in size is due to differences in  slice selection or screw decrease in size from prior exam. Recommend additional follow-up chest CT or PET CT in 3 months. Aortic Atherosclerosis (ICD10-I70.0) and Emphysema (ICD10-J43.9). Electronically Signed   By: Keith Rake M.D.   On: 06/19/2022 15:13   DG Chest 2 View  Result Date: 06/19/2022 IMPRESSION: 1. No acute cardiopulmonary disease. 2. Persistent posterior left lower lobe nodule as seen on prior CT. As per prior recommendations, follow-up noncontrast chest CT, PET-CT, or tissue sampling is recommended now, unless the patient has significant comorbidities. Electronically Signed   By: Titus Dubin M.D.   On: 06/19/2022 11:34    Cardiac Studies   [facility] 05/02/2022: Conclusions: Patent LAD stents with stable mild in-stent restenosis of remotely placed mid-distal LAD stents,  unchanged from completion of last catheterization.  Recently placed proximal LAD stent is widely patent. Severe LCx/OM2 disease, similar to prior catheterizations. Mildly elevated left ventricular filling pressure. Successful complex bifurcation PCI to LCx/OM2 using Culotte technique (Onyx frontier 2.75 x 30 mm main branch and 2.5 x 15 mm side branch drug-eluting stents) with 0% residual stenosis and TIMI-3 flow.   Recommendations: Continue dual antiplatelet therapy with aspirin and clopidogrel for at least 12 months. Aggressive secondary prevention. Overnight observation. ___________ [facility] 04/14/2022: Conclusions: Severe three-vessel coronary artery disease, as detailed in Dr. Allison Quarry diagnostic catheterization report from yesterday. Successful PCI to proximal RCA using Onyx Frontier 2.5 x 18 mm drug-eluting stent with 0% residual stenosis and TIMI-3 flow. Successful PCI to proximal LAD stenosis using Onyx Frontier 3.5 x 15 mm drug-eluting stent with 0% residual stenosis and TIMI-3 flow. Successful angioplasty and drug-eluting stent placement to in-stent restenosis involving long segment of mid/distal LAD  stenting.  The entire stent was angioplastied and an Onyx Frontier 2.0 x 18 mm drug-eluting stent placed at the distal edge, leading to reduction of stenosis from 70% to 20-30% in the proximal and mid portions and 0% residual stenosis at the distal edge with TIMI-3 flow throughout the vessel.   Recommendations: Continue indefinite dual antiplatelet therapy with aspirin and clopidogrel. Aggressive secondary prevention of coronary artery disease. Staged PCI to LCx disease.  Timing will depend on patient's symptoms.  If he is angina free and otherwise stable for discharge over the weekend, staged PCI could be arranged to be done in the next 1 to 2 weeks as an outpatient.  If he has recurrent angina or is otherwise still in the hospital, PCI to the LCx could be performed early next week. __________   [facility] 04/13/2022:   --------Severe 3 Vessel CAD--------------   Small caliber-codominant RCA: Prox RCA-1 lesion is 70% stenosis tapers to Prox RCA-2 lesion is 99% stenosed. (Likely Culprit Lesion #1)   Codominant circumflex:  Prox Cx lesion is 55% stenosed.  1st Mrg lesion is 70% stenosed. Just past 1st Mrf, Mid Cx to Dist Cx lesion is 80% stenosed with 70% stenosed side branch in 2nd Mrg.   Prox LAD to Mid LAD lesion is 80% stenosed - focal/concentric.  Mid LAD to Dist LAD long stented segment is 70% stenosed. In-stent restenosis. Dist LAD lesion is 95% stenosed -> involving the distal stent edge into native vessel.   -------------------------------------------   LV end diastolic pressure is moderately elevated.   There is no aortic valve stenosis.     FOR FUTURE CARDIAC CATHETERIZATION, WOULD NOT RECOMMEND USING RIGHT RADIAL ACCESS DUE TO THE EXTREME TORTUOSITY OF THE SUBCLAVIAN-INNOMINATE ARTERY AND AORTIC ARCH.   PLAN OF CARE:  Continue with plan to admit overnight.  Will restart IV heparin 2 hours after sheath removal. \            Will need to meet with patient and family as well as have a heart  team discussion with interventional cardiology and CVTS to determine best treatment options.  If he were to need PCI, may be best for him to be transferred to [facility] as the procedure would like to be prolonged and difficult.Potential needs to be noted to the patient's mental status, and baseline functional status. __________   2D echo 04/13/2022: 1. Left ventricular ejection fraction, by estimation, is 55 to 60%. The  left ventricle has normal function. The left ventricle demonstrates  regional wall motion abnormalities (hypokinesis of teh basal septal  region). There is mild left ventricular  hypertrophy. Left ventricular diastolic parameters are consistent with  Grade I diastolic dysfunction (impaired relaxation).   2. Right ventricular systolic function is normal. The right ventricular  size is normal. There is normal pulmonary artery systolic pressure. The  estimated right ventricular systolic pressure is 123456 mmHg.   3. The mitral valve is normal in structure. No evidence of mitral valve  regurgitation. No evidence of mitral stenosis.   4. The aortic valve is normal in structure. Aortic valve regurgitation is  mild. Aortic valve sclerosis is present, with no evidence of aortic valve  stenosis.   5. The inferior vena cava is normal in size with greater than 50%  respiratory variability, suggesting right atrial pressure of 3 mmHg.  __________   Carotid artery ultrasound 12/06/2018: Summary:  Right Carotid: Velocities in the right ICA are consistent with a 1-39%  stenosis.                Non-hemodynamically significant plaque <50% noted in the  CCA. The ECA appears <50% stenosed.   Left Carotid: Velocities in the left ICA are consistent with a 1-39%  stenosis.               Non-hemodynamically significant plaque <50% noted in the  CCA. The ECA appears >50% stenosed.   Vertebrals:  Bilateral vertebral arteries demonstrate antegrade flow.  Subclavians: Normal flow hemodynamics  were seen in bilateral subclavian arteries.  __________   Elwyn Reach patch 05/2016: Normal sinus rhythm Min HR of 40 bpm, max HR of 171 bpm, and avg HR of 69 bpm.    7 Atrial tachycardia runs occurred, the longest lasting 7 beats with an avg rate of 105 bpm.   Isolated SVEs were rare (<1.0%), SVE Couplets were rare (<1.0%), and SVE Triplets were rare (<1.0%).  Isolated VEs were rare (<1.0%), VE Couplets were rare (<1.0%), and no VE Triplets were present. __________   [facility] 05/02/2016: Pharmacological myocardial perfusion imaging study with no significant  Ischemia Small region of predominantly fixed defect in the anteroapical wall of mild severity Normal wall motion (GI uptake artifact noted),  EF estimated at 59% No EKG changes concerning for ischemia at peak stress or in recovery. Severe hypertension noted, SBP 180 Low risk scan  Patient Profile     82 y.o. male with history of CAD with apical MI in 2007 status post stenting x 3 to the LAD with NSTEMI in 04/2022 status post PCI and DES to the LAD and RCA with staged PCI to the LCx, TIA/CVA in 2012, atrial tachycardia, bilateral carotid artery disease, dementia, DM2, HTN, HLD, and prior tobacco use who is being seen today for the evaluation of antiplatelet management in the context of significant trauma following mechanical fall at the request of Dr. Ernestina Patches.   Assessment & Plan    1. Mechanical fall with traumatic multiple rib fractures, right-sided hemothorax, and intramuscular hematoma -No evidence of syncope or cardiac decompensation precipitating this event -Appears to be mechanical fall, slipping in the bathtub as his wife reports the patient requires a large amount of soapsuds -Given multiple stents recently placed, will continue on aspirin 81 mg daily -With progressive right-sided hemothorax and symptomatic anemia that will be requiring PRBC transfusion, unable to resume clopidogrel at this time -There is concern for development  of pneumonia on chest CT, ongoing management per IM -May need chest tube, will defer to IM -Recommend regular use of incentive spirometry   2.  Multivessel  CAD involving native coronary arteries without angina: -No symptoms concerning for angina or cardiac decompensation -Continue aspirin as outlined above with recommendation to resume clopidogrel as soon as safely possible -PTA lisinopril, metoprolol, and atorvastatin -No plans for inpatient ischemic evaluation at this time   3.  Atrial tachycardia: -Quiescent on metoprolol   4.  HTN: -Blood pressure reasonably controlled -PTA lisinopril and metoprolol   5.  History of TIA/CVA: -No new symptoms -Aspirin and atorvastatin as outlined above   6.  Symptomatic anemia with progressive right hemothorax and intramuscular hematoma: -Baseline hemoglobin around 11-12 -Hemoglobin has trended to 6.7 with progression of right-sided hemothorax, may need chest tube -Transfuse as indicated to maintain hemoglobin greater than 8 -Monitor closely with resumption of aspirin       For questions or updates, please contact [facility] Please consult [email or web address] for contact info under Cardiology/STEMI.    Signed, Christell Faith, PA-C [facility] : [facility] Pager: [phone] 06/21/2022, 10:50 AM

## 2022-06-21 NOTE — Consult Note (Signed)
[facility] SURGICAL CONSULTATION NOTE (initial) - cpt: 99254   HISTORY OF PRESENT ILLNESS (HPI):  82 y.o. male presented to [facility] ED 6/18 after falling in bathtub onto right side.  He sustained fractures of the posterior right ribs 8-12, some displaced, with muscular hematoma, and small hemothorax.    Surgery is consulted by Hospitalist physician Dr. Mal Misty in this context for evaluation and management increased atelectasis and hemo-thorax of the right chest with multiple rib fractures in a pt previously on Plavix.  Drop of H/H noted since admission with poor tolerance of participation with PT.  Reports good pain control on Ibuprofen alone, but remains immobile.  Marland Kitchen   PAST MEDICAL HISTORY (PMH):  Past Medical History:  Diagnosis Date   Arthritic-like pain    CAD (coronary artery disease)    a. apical MI 5/07 with LHC showing 50% pCFX, 30% pRCA, 99% pLAD, 99% mLAD, 75% dLAD (cypher DES x3 to LAD); b. ETT-myoview (12/08) 81% MPHR,  EF 64%, partially reversible inferoapical perfusion defect similar to prior study 12/07; c. 04/2016 MV: EF 59%, fixed anteroapical defect, no ischemia->Low risk.   Carotid arterial disease ([place])    a. 05/2016 Carotid U/S: 30-40% bilat dzs, f/u in 2 yrs.   Chest pain 04/13/2022   CVA (cerebral vascular accident) [facility])    a. 07/2010 - h/o CVA/TIA.   Diet-controlled type 2 diabetes mellitus (HCC)    HLD (hyperlipidemia)    HTN (hypertension)    [name] (paroxysmal atrial tachycardia)    a. 06/2016 Event monitor:  rare episodes of atrial tachycardia, longest 7 beats. No afib.     PAST SURGICAL HISTORY ([place]):  Past Surgical History:  Procedure Laterality Date   BACK SURGERY     CARDIAC CATHETERIZATION     CORONARY ANGIOPLASTY WITH STENT PLACEMENT     apical MI 5/07 with LHC showing 50% pCFX, 30% pRCA, 99% pLAD, 9%% mLAD, 75% dLAD. cypher DES x3 to LAD. ETT-myoview (12/08) 81% MPHR, 8'1, EF 64%, partially reversible inferoapical perfusion defect similar to  prior study 12/07   CORONARY STENT INTERVENTION N/A 04/14/2022   Procedure: CORONARY STENT INTERVENTION;  Surgeon: Nelva Bush, MD;  Location: [place] [facility];  Service: Cardiovascular;  Laterality: N/A;   CORONARY STENT INTERVENTION Left 05/02/2022   Procedure: CORONARY STENT INTERVENTION;  Surgeon: Nelva Bush, MD;  Location: [place] [facility];  Service: Cardiovascular;  Laterality: Left;   INTRAVASCULAR IMAGING/OCT N/A 04/14/2022   Procedure: INTRAVASCULAR IMAGING/OCT;  Surgeon: Nelva Bush, MD;  Location: [place] [facility];  Service: Cardiovascular;  Laterality: N/A;   LEFT HEART CATH AND CORONARY ANGIOGRAPHY N/A 04/13/2022   Procedure: LEFT HEART CATH AND CORONARY ANGIOGRAPHY;  Surgeon: [name], MD;  Location: [place] [facility];  Service: Cardiovascular;  Laterality: N/A;     MEDICATIONS:  Prior to Admission medications   Medication Sig Start Date End Date Taking? Authorizing Provider  aspirin 81 MG tablet Take 81 mg by mouth daily.   Yes [provider]  atorvastatin (LIPITOR) 80 MG tablet Take 1 tablet (80 mg total) by mouth daily. 04/16/22  Yes Nicole Kindred A, DO  clopidogrel (PLAVIX) 75 MG tablet TAKE 1 TABLET BY MOUTH EVERY DAY WITH BREAKFAST 06/15/22  Yes Gollan, Kathlene November, MD  cyanocobalamin 1000 MCG tablet Take 1,000 mcg by mouth daily.   Yes [provider]  lisinopril (ZESTRIL) 20 MG tablet TAKE 1 TABLET BY MOUTH EVERY DAY 07/04/21  Yes Gollan, Kathlene November, MD  metFORMIN (GLUCOPHAGE)  1000 MG tablet Take 1 tablet (1,000 mg total) by mouth 2 (two) times daily with a meal. Resume 05/05/2022 05/03/22  Yes Dunn, Areta Haber, PA-C  metoprolol tartrate (LOPRESSOR) 25 MG tablet TAKE 1 TABLET BY MOUTH TWICE A DAY 10/18/21  Yes Gollan, Kathlene November, MD  pantoprazole (PROTONIX) 20 MG tablet Take 1 tablet (20 mg total) by mouth daily. 04/16/22  Yes Nicole Kindred A, DO  sertraline (ZOLOFT) 25 MG tablet Take 1 tablet (25 mg total) by mouth daily. 04/26/16   Yes Gollan, Kathlene November, MD  sitaGLIPtin (JANUVIA) 25 MG tablet Take 1 tablet by mouth daily. 02/21/21  Yes [provider]     ALLERGIES:  Allergies  Allergen Reactions   Metformin And Related Itching    Erectile dysfunction   Simvastatin Rash    rash     SOCIAL HISTORY:  Social History   Socioeconomic History   Marital status: Married    Spouse name: Not on file   Number of children: Not on file   Years of education: Not on file   Highest education level: Not on file  Occupational History   Occupation: retired  Tobacco Use   Smoking status: Former    Packs/day: 1.00    Years: 25.00    Additional pack years: 0.00    Total pack years: 25.00    Types: Cigarettes    Quit date: 04/17/1985    Years since quitting: 37.2   Smokeless tobacco: Never  Vaping Use   Vaping Use: Never used  Substance and Sexual Activity   Alcohol use: No   Drug use: No   Sexual activity: Not on file  Other Topics Concern   Not on file  Social History Narrative   Part time work at [employer]. Does not regularly exercise.    Social Determinants of Health   Financial Resource Strain: Not on file  Food Insecurity: No Food Insecurity (06/19/2022)   Hunger Vital Sign    Worried About Running Out of Food in the Last Year: Never true    Ran Out of Food in the Last Year: Never true  Transportation Needs: No Transportation Needs (06/19/2022)   PRAPARE - Hydrologist (Medical): No    Lack of Transportation (Non-Medical): No  Physical Activity: Not on file  Stress: Not on file  Social Connections: Not on file  Intimate Partner Violence: Not At Risk (06/19/2022)   Humiliation, Afraid, Rape, and Kick questionnaire    Fear of Current or Ex-Partner: No    Emotionally Abused: No    Physically Abused: No    Sexually Abused: No     FAMILY HISTORY:  Family History  Family history unknown: Yes     VITAL SIGNS:  Temp:  [97.9 F (36.6 C)-98.7 F (37.1 C)]  97.9 F (36.6 C) (03/20 0808) Pulse Rate:  [77-97] 78 (03/20 0808) Resp:  [16-19] 19 (03/20 0504) BP: (105-167)/(54-70) 167/70 (03/20 0808) SpO2:  [93 %-97 %] 97 % (03/20 0808)     Height: 5\' 7"  (170.2 cm) Weight: 69.6 kg BMI (Calculated): 24.03   INTAKE/OUTPUT:  03/19 0701 - 03/20 0700 In: -  Out: 300 [Urine:300]  PHYSICAL EXAM:  Physical Exam Blood pressure (!) 167/70, pulse 78, temperature 97.9 F (36.6 C), temperature source Oral, resp. rate 19, height 5\' 7"  (1.702 m), weight 69.6 kg, SpO2 97 %. Last Weight  Most recent update: 06/19/2022  9:26 PM    Weight  69.6 kg (153 lb 7  oz)             CONSTITUTIONAL: Well developed, and nourished, appropriately responsive and aware without distress. Seems sleepy, but readily arousable, and c/o pain with rolling him to evaluate his right flank.   EYES: Sclera non-icteric.   EARS, NOSE, MOUTH AND THROAT: The oropharynx is clear. Oral mucosa is pink and moist.    Hearing is intact to voice.  NECK: Trachea is midline, and there is no jugular venous distension.  LYMPH NODES:  Lymph nodes in the neck are not enlarged. RESPIRATORY:  Lungs are markely diminished on the right.   Minimal respiratory effort at rest w/o pathologic use of accessory muscles.  Ecchymosis and swelling of the right posterior chest extending caudal onto flank, and hip region.  CARDIOVASCULAR: Heart is regular in rate and rhythm.   Well perfused.  GI: The abdomen is soft, nontender, and nondistended. There were no palpable masses. I did not appreciate hepatosplenomegaly. There were normal bowel sounds. MUSCULOSKELETAL:  Symmetrical muscle tone appreciated in all four extremities. Warm without edema.  SKIN: Skin turgor is normal. No pathologic skin lesions appreciated.  NEUROLOGIC:  Motor and sensation appear grossly normal.  Cranial nerves are grossly without defect. PSYCH:  Alert and oriented to person, place and time. Affect is appropriate for situation.  Data  Reviewed I have personally reviewed what is currently available of the patient's imaging, recent labs and medical records.    Labs:     Latest Ref Rng & Units 06/21/2022    5:10 AM 06/20/2022    2:24 PM 06/20/2022    4:25 AM  CBC  WBC 4.0 - 10.5 K/uL 10.5     Hemoglobin 13.0 - 17.0 g/dL 6.7  7.5  8.8   Hematocrit 39.0 - 52.0 % 20.7  23.2  27.3   Platelets 150 - 400 K/uL 235         Latest Ref Rng & Units 06/20/2022    4:25 AM 06/19/2022   11:45 AM 05/03/2022    1:50 AM  CMP  Glucose 70 - 99 mg/dL 171  231  137   BUN 8 - 23 mg/dL 25  21  15    Creatinine 0.61 - 1.24 mg/dL 1.17  1.29  0.97   Sodium 135 - 145 mmol/L 134  135  137   Potassium 3.5 - 5.1 mmol/L 4.3  4.9  3.7   Chloride 98 - 111 mmol/L 98  97  105   CO2 22 - 32 mmol/L 24  25  25    Calcium 8.9 - 10.3 mg/dL 8.7  8.8  8.8   Total Protein 6.5 - 8.1 g/dL 6.5  6.7    Total Bilirubin 0.3 - 1.2 mg/dL 0.9  0.7    Alkaline Phos 38 - 126 U/L 62  73    AST 15 - 41 U/L 31  39    ALT 0 - 44 U/L 21  20       Imaging studies:   Last 24 hrs: CT CHEST WO CONTRAST  Result Date: 06/21/2022 CLINICAL DATA:  Right hemothorax, anemia EXAM: CT CHEST WITHOUT CONTRAST TECHNIQUE: Multidetector CT imaging of the chest was performed following the standard protocol without IV contrast. RADIATION DOSE REDUCTION: This exam was performed according to the departmental dose-optimization program which includes automated exposure control, adjustment of the mA and/or kV according to patient size and/or use of iterative reconstruction technique. COMPARISON:  CT 06/19/2022 FINDINGS: Cardiovascular: Heart size is normal. No pericardial effusion. Relative hypoattenuation  of the cardiac blood pool indicative of anemia. Thoracic aortic arch measures 3.8 cm in diameter. Atherosclerotic calcification of the aorta and coronary arteries. Central pulmonary vasculature is within normal limits. Mediastinum/Nodes: No new or enlarging lymph nodes within the chest. Thyroid,  trachea, and esophagus demonstrate no significant findings. No mediastinal hematoma. No pneumomediastinum. Lungs/Pleura: Small-moderate-sized right hemothorax, increased from prior. Minimal air within the right pleural space. Near-complete right lower lobe atelectasis/consolidation, significantly progressed. Trace left pleural effusion with new dependent opacities in the left lower lobe and lingula. Background of mild-to-moderate emphysema. Upper Abdomen: No new or acute findings within the imaged upper abdomen. Musculoskeletal: Redemonstration of multiple acute right-sided rib fractures including the right eighth through twelfth ribs as well as the right T11 transverse process. The degree of displacement of the right eighth rib fracture has increased since prior (series 2, image 107). Posterior paraspinal intramuscular hematoma centered at the posterior right eleventh rib fracture measures approximately 8.0 x 3.4 x 7.5 cm, similar in size to the previous study. No subcutaneous emphysema of the chest wall. IMPRESSION: 1. Small-moderate-sized right hemothorax, increased from prior study. Minimal air within the right pleural space. 2. Near-complete right lower lobe atelectasis/consolidation, significantly progressed since prior study. 3. Trace left pleural effusion with new dependent opacities in the left lower lobe and lingula, which may represent atelectasis or developing pneumonia. 4. Redemonstration of multiple acute right-sided rib fractures including the right eighth through twelfth ribs as well as the right T11 transverse process. The degree of displacement of the right eighth rib fracture has increased since prior study. 5. Posterior paraspinal intramuscular hematoma centered at the posterior right eleventh rib fracture measures approximately 8.0 x 3.4 x 7.5 cm, similar in size to the previous study. 6. Aortic and coronary artery atherosclerosis (ICD10-I70.0). 7. Thoracic aortic arch measures 3.8 cm in  diameter. Recommend annual imaging followup by CTA or MRA. This recommendation follows 2010 ACCF/AHA/AATS/ACR/ASA/SCA/SCAI/SIR/STS/SVM Guidelines for the Diagnosis and Management of Patients with Thoracic Aortic Disease. Circulation.2010; [id number]. Aortic aneurysm NOS (ICD10-I71.9) Electronically Signed   By: Davina Poke D.O.   On: 06/21/2022 09:46   DG Chest Port 1 View  Result Date: 06/20/2022 CLINICAL DATA:  Right hemothorax. EXAM: PORTABLE CHEST 1 VIEW COMPARISON:  June 19, 2022. FINDINGS: The heart size and mediastinal contours are within normal limits. Mild bibasilar subsegmental atelectasis is noted. No definite pneumothorax or pleural effusion is noted. The visualized skeletal structures are unremarkable. IMPRESSION: Mild bibasilar subsegmental atelectasis. Electronically Signed   By: Marijo Conception M.D.   On: 06/20/2022 14:12     Assessment/Plan:  82 y.o. male with right hemothorax, atelectasis and multiple right sided rib fractures, complicated by pertinent comorbidities including:  Patient Active Problem List   Diagnosis Date Noted   Acute blood loss anemia 06/21/2022   Multiple rib fractures involving four or more ribs, right side 06/20/2022   Intramuscular hematoma 06/19/2022   Rib fractures 06/19/2022   Hemothorax 06/19/2022   Leukocytosis 06/19/2022   Status post coronary artery stent placement 05/02/2022   Mild dementia ([place]) 04/14/2022   Non-ST elevation (NSTEMI) myocardial infarction ([place]) 04/13/2022   History of COVID-19 04/13/2022   Lung nodule 04/13/2022   PAD (peripheral artery disease) ([place]) 04/13/2022   Flu 06/05/2017   Diabetes mellitus type 2, controlled, with complications ([place]) 0000000   Carotid stenosis 05/14/2013   CAROTID BRUIT, LEFT 04/25/2010   PALPITATIONS 06/18/2009   Hyperlipidemia 04/27/2009   Essential hypertension 04/27/2009   CAD, NATIVE VESSEL 04/27/2009    -  Advise consideration of additional means of pain control (pain  consult) to enable pt to engage in incentive spirometry/pulmonary toilet, PT, etc   - Will follow with you with additional/serial CXR imaging.  Current benefit to risk ratio isn't favorable at present to have IR place a posterior drain as the actual volume of blood is insufficient.    - I am aware of his recent CV stent history and the desire to resume Plavix is present.  I agree with transfusion of PRBCs at this time and will monitor his H/H with you.    - I am uncertain if a role for plating these ribs would assist in his pain control, I suspect not.    All of the above findings and recommendations were discussed with the patient and  family(if present), and all of patient's and present family's questions were answered to their expressed satisfaction.  Thank you for the opportunity to participate in this patient's care.   -- Ronny Bacon, M.D., FACS 06/21/2022, 12:31 PM

## 2022-06-21 NOTE — Telephone Encounter (Signed)
Left voicemail message to call back once he is discharged so we can schedule appointment with Dr. Fletcher Anon.

## 2022-06-21 NOTE — Progress Notes (Signed)
       CROSS COVER NOTE  NAME: Raymond Sampson MRN: [id number] DOB : [birth year] ATTENDING PHYSICIAN: Jennye Boroughs, MD    Date of Service   06/21/2022   HPI/Events of Note   Patient request for tylenol for 6/10 intermittent leg pain described as throbbing. Patient would like to avoid ordered oxycodone.  Interventions   Assessment/Plan:  Tylenol x1      To reach the provider On-Call:   7AM- 7PM see care teams to locate the attending and reach out to them via [email or web address] Password: [email or web address] 7PM-7AM contact night-coverage If you still have difficulty reaching the appropriate provider, please page the [facility] (Director on Call) for [facility] on amion for assistance  This document was prepared using Systems analyst and may include unintentional dictation errors.  Neomia Glass DNP, MBA, FNP-BC, PMHNP-BC Nurse Practitioner [facility] Pager [phone]

## 2022-06-21 NOTE — Evaluation (Addendum)
Clinical/Bedside Swallow Evaluation Patient Details  Name: Raymond Sampson MRN: [id number] Date of Birth: [birth year]  Today's Date: 06/21/2022 Time: SLP Start Time (ACUTE ONLY): 46 SLP Stop Time (ACUTE ONLY): [id number] SLP Time Calculation (min) (ACUTE ONLY): 60 min  Past Medical History:  Past Medical History:  Diagnosis Date   Arthritic-like pain    CAD (coronary artery disease)    a. apical MI 5/07 with LHC showing 50% pCFX, 30% pRCA, 99% pLAD, 99% mLAD, 75% dLAD (cypher DES x3 to LAD); b. ETT-myoview (12/08) 81% MPHR,  EF 64%, partially reversible inferoapical perfusion defect similar to prior study 12/07; c. 04/2016 MV: EF 59%, fixed anteroapical defect, no ischemia->Low risk.   Carotid arterial disease ([place])    a. 05/2016 Carotid U/S: 30-40% bilat dzs, f/u in 2 yrs.   Chest pain 04/13/2022   CVA (cerebral vascular accident) [facility])    a. 07/2010 - h/o CVA/TIA.   Diet-controlled type 2 diabetes mellitus (HCC)    HLD (hyperlipidemia)    HTN (hypertension)    [name] (paroxysmal atrial tachycardia)    a. 06/2016 Event monitor:  rare episodes of atrial tachycardia, longest 7 beats. No afib.   Past Surgical History:  Past Surgical History:  Procedure Laterality Date   BACK SURGERY     CARDIAC CATHETERIZATION     CORONARY ANGIOPLASTY WITH STENT PLACEMENT     apical MI 5/07 with LHC showing 50% pCFX, 30% pRCA, 99% pLAD, 9%% mLAD, 75% dLAD. cypher DES x3 to LAD. ETT-myoview (12/08) 81% MPHR, 8'1, EF 64%, partially reversible inferoapical perfusion defect similar to prior study 12/07   CORONARY STENT INTERVENTION N/A 04/14/2022   Procedure: CORONARY STENT INTERVENTION;  Surgeon: Nelva Bush, MD;  Location: [facility];  Service: Cardiovascular;  Laterality: N/A;   CORONARY STENT INTERVENTION Left 05/02/2022   Procedure: CORONARY STENT INTERVENTION;  Surgeon: Nelva Bush, MD;  Location: [facility];  Service: Cardiovascular;  Laterality: Left;   INTRAVASCULAR IMAGING/OCT  N/A 04/14/2022   Procedure: INTRAVASCULAR IMAGING/OCT;  Surgeon: Nelva Bush, MD;  Location: [facility];  Service: Cardiovascular;  Laterality: N/A;   LEFT HEART CATH AND CORONARY ANGIOGRAPHY N/A 04/13/2022   Procedure: LEFT HEART CATH AND CORONARY ANGIOGRAPHY;  Surgeon: [name], MD;  Location: [facility];  Service: Cardiovascular;  Laterality: N/A;   HPI:  Per H&P, pt "is a 82 y.o. male with medical history significant of coronary artery disease status post multiple stenting's with most recent stenting January 20 24 x 2, carotid disease, CVA, type 2 diabetes, hyperlipidemia, hypertension presenting with fall, intramuscular hematoma, rib fractures.  History from the patient as well as his wife.  Per report, patient was taking a shower in the bathroom when he slipped on some soap landing on his right side and back area.  No reported head trauma or loss of consciousness.  No reported dizziness or weakness prior to fall.  Has had significant excruciating pain since this point.  Positive chest pain with moving and deep breathing.  No abdominal pain nausea or vomiting.  Presented to the ER Tmax 100.4, hemodynamically stable.  White count 15.7, hemoglobin 9.8.  Creatinine 1.29, glucose 231.  CT of the chest abdomen pelvis with multiple right posterior lower rib fractures from ribs 8 through 12.  The ninth 10th and 11th rib fractures are displaced and segmental.  There is a fracture also of the T11 transverse process.  Small right hemothorax with adjacent atelectasis or contusion to the right  lower lobe." Per chart review, pt w/ hx of "mild Dementia."    Assessment / Plan / Recommendation  Clinical Impression   Pt seen today for BSE. Pt alert, coopeartive, and min drowsy t/o eval. Pt oriented to self, family members, and place. Pt carefully repositioned to sit upright in bed to participate in BSE. Pt intermittently closed eyes during eval d/t min drowsiness but quickly opened eyes  and reoriented to task given min verbal cues. ST completed oral care. Pt left sitting up in bed w/ bed alarm set and call button in reach.  Of note: Pt w/ baseline Dementia. Of note: Pt wear upper and lower denture plates. Noted upper dentures occasionally slipped down when speaking/eating. Pt on RA; afebrile; WBC WNL. Carefully monitored any potential discomfort from R rib fracture when positioning upright in bed. This was discussed w/ family/NSG.  Pt appears to present w/ FUNCTIONAL oropharyngeal swallowing during this evaluation. Pt requires min verbal cues in several instances to redirect to task likely impacted by pt's drowsiness noted t/o eval. Given min verbal cues, pt quickly redirected to task. Any cognitive decline can impact overall awareness during oral intake and increase risk for aspiration/aspiration pneumonia. Following general aspiration precautions can help reduce this risk.  Administered trials of ice chips, thin liquids, and solids. ST fed pt d/t pt's pain rib fracture. Pt's oral phase notable for mildly prolonged mastication -- potentially d/t denture status, min drowsiness, and/or cognitive decline?. Pt required verbal cues in several instances to swallow bolus. Otherwise, pt's oral phase appeared [facility] c/b good rotary chewing, adequate bolus management/control, and clear oral cavity post-po's. During pharyngeal phase, pt exhibited seemingly timely pharyngeal swallow and clear vocal quality post-po's. No overt, consistent s/s of aspiration such as coughing, throat clearing, nor wet vocal quality noted.  Pt educated on diet recommendation, general aspiration precautions, and SLP POC. Signage posted in room outlining need for pt to sit upright w/ head forward during po's. Was able to meet w/ family member after session to review precautions recommended.   Recommend continue regular diet (w/ cut meats/foods moistened well for ease of chewing) w/ thin liquids. Recommend assistance w/  feeding d/t pt's current discomfort from rib fractures. Pt to assist w/ self feeding as possible. Follow strict aspiration precautions (sitting upright, small sips/bites, eating/drinking slowly) w/ all po's. Recommend pills whole in applesauce for safer swallowing during admit and for d/c home.  No acute ST needs at this time. ST will s/o. MD to reconsult if any new needs arise during admission. NSG updated.  SLP Visit Diagnosis: Dysphagia, unspecified (R13.10)    Aspiration Risk  Mild aspiration risk (in setting of Dementia)    Diet Recommendation   Recommend continue regular diet w/ thin liquids. Recommend assistance w/ feeding d/t pt's current pain level. Follow strict aspiration precautions (sitting upright, small sips/bites, eating/drinking slowly) w/ all po's.   Medication Administration: Whole meds with puree    Other  Recommendations Oral Care Recommendations: Oral care before and after PO;Oral care BID    Recommendations for follow up therapy are one component of a multi-disciplinary discharge planning process, led by the attending physician.  Recommendations may be updated based on patient status, additional functional criteria and insurance authorization.  Follow up Recommendations Follow physician's recommendations for discharge plan and follow up therapies      Assistance Recommended at Discharge  PRN  Functional Status Assessment Patient has had a recent decline in their functional status and demonstrates the ability to make significant  improvements in function in a reasonable and predictable amount of time.  Frequency and Duration            Prognosis Prognosis for improved oropharyngeal function: Good Barriers to Reach Goals: Cognitive deficits      Swallow Study   General Date of Onset: 06/19/22 HPI: Per H&P, pt "is a 82 y.o. male with medical history significant of coronary artery disease status post multiple stenting's with most recent stenting January 20 24 x 2,  carotid disease, CVA, type 2 diabetes, hyperlipidemia, hypertension presenting with fall, intramuscular hematoma, rib fractures.  History from the patient as well as his wife.  Per report, patient was taking a shower in the bathroom when he slipped on some soap landing on his right side and back area.  No reported head trauma or loss of consciousness.  No reported dizziness or weakness prior to fall.  Has had significant excruciating pain since this point.  Positive chest pain with moving and deep breathing.  No abdominal pain nausea or vomiting.  Presented to the ER Tmax 100.4, hemodynamically stable.  White count 15.7, hemoglobin 9.8.  Creatinine 1.29, glucose 231.  CT of the chest abdomen pelvis with multiple right posterior lower rib fractures from ribs 8 through 12.  The ninth 10th and 11th rib fractures are displaced and segmental.  There is a fracture also of the T11 transverse process.  Small right hemothorax with adjacent atelectasis or contusion to the right lower lobe." Per chart review, pt w/ hx of "mild Dementia." Type of Study: Bedside Swallow Evaluation Previous Swallow Assessment: N/A Diet Prior to this Study: Regular;Thin liquids (Level 0) Temperature Spikes Noted: No (WBC 10.5) Respiratory Status: Room air History of Recent Intubation: No Behavior/Cognition: Alert;Cooperative;Pleasant mood;Lethargic/Drowsy;Requires cueing Oral Cavity Assessment: Within Functional Limits Oral Care Completed by SLP: Yes Oral Cavity - Dentition: Dentures, top;Dentures, bottom Self-Feeding Abilities: Total assist (at this time d/t pain) Patient Positioning: Upright in bed Baseline Vocal Quality: Normal Volitional Cough:  (NT) Volitional Swallow: Able to elicit    Oral/Motor/Sensory Function Overall Oral Motor/Sensory Function: Within functional limits   Ice Chips Ice chips: Within functional limits (X2) Presentation: Spoon   Thin Liquid Thin Liquid: Within functional limits Presentation: Straw (~ 6  OZ)    Nectar Thick Nectar Thick Liquid: Not tested   Honey Thick Honey Thick Liquid: Not tested   Puree Puree: Not tested   Solid     Solid: Within functional limits Presentation:  (x 10+ bites)     Randall Hiss Graduate Clinician [place], Speech Pathology   Randall Hiss 06/21/2022,2:14 PM

## 2022-06-21 NOTE — Progress Notes (Addendum)
Progress Note    Raymond Sampson  [id number] DOB: [birth year]  DOA: 06/19/2022 PCP: [name], MD      Brief Narrative:    Medical records reviewed and are as summarized below:  Raymond Sampson is a 82 y.o. male with medical history significant of coronary artery disease status post multiple stenting's with most recent stenting January 2024 x 2, carotid disease, CVA, type 2 diabetes, dementia, hyperlipidemia, hypertension, who presented to the hospital after a fall at home.  Reportedly, he slipped on soap in the tub and landed on his right side.  Workup revealed multiple right rib fractures (8 to 12), intramuscular hematoma within the right paraspinal and posterior flank musculature and small right hemothorax.      Assessment/Plan:   Principal Problem:   Intramuscular hematoma Active Problems:   Rib fractures   Status post coronary artery stent placement   Hyperlipidemia   Essential hypertension   Diabetes mellitus type 2, controlled, with complications (HCC)   Hemothorax   Leukocytosis   Multiple rib fractures involving four or more ribs, right side   Acute blood loss anemia    Body mass index is 24.03 kg/m.   Multiple right rib fractures (8th through 12th), right T11 transverse process fracture, intramuscular hematoma within the right paraspinal and posterior flank musculature, and  right hemothorax, s/p fall: CT chest without contrast (done on 06/21/2022) showed small to moderate size right pneumothorax (increased from prior study), near complete right lower lobe atelectasis/consolidation new dependent opacities in the left lower lobe and lingula which may present atelectasis or developing pneumonia.  Clinically, doubt pneumonia at this time.  Case was discussed with Dr. Christian Mate, general surgeon.  He will see patient for right hemothorax.   Acute blood loss anemia: Hemoglobin was 9.8 on admission.  Hemoglobin is down to 6.7.  Transfuse 1 unit of  packed red blood cells.  Monitor H&H closely.  Risk and benefits of blood transfusion discussed with the patient and his wife at the bedside and they are agreeable to blood transfusion.   Acute hypoxic respiratory failure: Continue 2 L/min oxygen via [place].  Wean off oxygen as able.   CAD s/p coronary stent in January 2024, history of stroke: Continue aspirin.  Plavix has been held because of hemothorax.   Type II DM with hyperglycemia: NovoLog as needed.  Metformin and Januvia have been held.  Dementia: Continue supportive care   Diet Order             Diet heart healthy/carb modified Room service appropriate? Yes; Fluid consistency: Thin  Diet effective now                            Consultants: Cardiologist  Procedures: None    Medications:    sodium chloride   Intravenous Once   aspirin EC  81 mg Oral Daily   atorvastatin  80 mg Oral Daily   insulin aspart  0-5 Units Subcutaneous QHS   insulin aspart  0-9 Units Subcutaneous TID WC   lisinopril  20 mg Oral Daily   metoprolol tartrate  25 mg Oral BID   ondansetron (ZOFRAN) IV  4 mg Intravenous Once   pantoprazole  20 mg Oral Daily   sertraline  25 mg Oral Daily   Continuous Infusions:  sodium chloride 50 mL/hr at 06/20/22 1056     Anti-infectives (From admission, onward)    None  Family Communication/Anticipated D/C date and plan/Code Status   DVT prophylaxis: SCDs Start: 06/19/22 1719     Code Status: Full Code  Family Communication: Plan discussed with his wife at the bedside Disposition Plan: Plan to discharge home in 2 to 3 days   Status is: Inpatient Remains inpatient appropriate because: Hemothorax, acute blood loss anemia         Subjective:   Interval events noted.  He does not provide much history.  He has pain on the right side of the chest wall with movement.  No chest pain or shortness of breath.  No hemoptysis.  Objective:    Vitals:    06/20/22 1724 06/20/22 2112 06/21/22 0504 06/21/22 0808  BP: (!) 137/54 (!) 120/58 105/66 (!) 167/70  Pulse: 88 97 77 78  Resp: 16 19 19    Temp:  98.7 F (37.1 C) 98.6 F (37 C) 97.9 F (36.6 C)  TempSrc:  Oral  Oral  SpO2: 94% 93% 97% 97%  Weight:      Height:       No data found.   Intake/Output Summary (Last 24 hours) at 06/21/2022 1226 Last data filed at 06/21/2022 [id number] Gross per 24 hour  Intake --  Output 900 ml  Net -900 ml   Filed Weights   06/19/22 2124  Weight: 69.6 kg    Exam:   GEN: NAD, he looks weak SKIN: Bruises/ecchymosis on the right side of the back EYES: PERRL, anicteric ENT: MMM CV: RRR PULM: CTA B ABD: soft, ND, NT, +BS CNS: AAO x 3, non focal EXT: No edema or tenderness MSK: Right lateral chest wall tenderness        Data Reviewed:   I have personally reviewed following labs and imaging studies:  Labs: Labs show the following:   Basic Metabolic Panel: Recent Labs  Lab 06/19/22 1145 06/20/22 0425  NA 135 134*  K 4.9 4.3  CL 97* 98  CO2 25 24  GLUCOSE 231* 171*  BUN 21 25*  CREATININE 1.29* 1.17  CALCIUM 8.8* 8.7*   GFR Estimated Creatinine Clearance: 46.3 mL/min (by C-G formula based on SCr of 1.17 mg/dL). Liver Function Tests: Recent Labs  Lab 06/19/22 1145 06/20/22 0425  AST 39 31  ALT 20 21  ALKPHOS 73 62  BILITOT 0.7 0.9  PROT 6.7 6.5  ALBUMIN 3.4* 3.4*   No results for input(s): "LIPASE", "AMYLASE" in the last 168 hours. No results for input(s): "AMMONIA" in the last 168 hours. Coagulation profile No results for input(s): "INR", "PROTIME" in the last 168 hours.  CBC: Recent Labs  Lab 06/19/22 1145 06/19/22 1925 06/19/22 2329 06/20/22 0425 06/20/22 1424 06/21/22 0510  WBC 15.7*  --   --   --   --  10.5  NEUTROABS  --   --   --   --   --  8.1*  HGB 9.8* 8.4* 8.1* 8.8* 7.5* 6.7*  HCT 30.9* 25.6* 24.4* 27.3* 23.2* 20.7*  MCV 95.4  --   --   --   --  93.2  PLT 317  --   --   --   --  235   Cardiac  Enzymes: No results for input(s): "CKTOTAL", "CKMB", "CKMBINDEX", "TROPONINI" in the last 168 hours. BNP (last 3 results) No results for input(s): "PROBNP" in the last 8760 hours. CBG: Recent Labs  Lab 06/20/22 0928 06/20/22 1218 06/20/22 1726 06/20/22 2115 06/21/22 1039  GLUCAP 193* 178* 170* 187* 169*   D-Dimer: No results  for input(s): "DDIMER" in the last 72 hours. Hgb A1c: No results for input(s): "HGBA1C" in the last 72 hours. Lipid Profile: No results for input(s): "CHOL", "HDL", "LDLCALC", "TRIG", "CHOLHDL", "LDLDIRECT" in the last 72 hours. Thyroid function studies: No results for input(s): "TSH", "T4TOTAL", "T3FREE", "THYROIDAB" in the last 72 hours.  Invalid input(s): "FREET3" Anemia work up: No results for input(s): "VITAMINB12", "FOLATE", "FERRITIN", "TIBC", "IRON", "RETICCTPCT" in the last 72 hours. Sepsis Labs: Recent Labs  Lab 06/19/22 1145 06/21/22 0510  WBC 15.7* 10.5    Microbiology No results found for this or any previous visit (from the past 240 hour(s)).  Procedures and diagnostic studies:  CT CHEST WO CONTRAST  Result Date: 06/21/2022 CLINICAL DATA:  Right hemothorax, anemia EXAM: CT CHEST WITHOUT CONTRAST TECHNIQUE: Multidetector CT imaging of the chest was performed following the standard protocol without IV contrast. RADIATION DOSE REDUCTION: This exam was performed according to the departmental dose-optimization program which includes automated exposure control, adjustment of the mA and/or kV according to patient size and/or use of iterative reconstruction technique. COMPARISON:  CT 06/19/2022 FINDINGS: Cardiovascular: Heart size is normal. No pericardial effusion. Relative hypoattenuation of the cardiac blood pool indicative of anemia. Thoracic aortic arch measures 3.8 cm in diameter. Atherosclerotic calcification of the aorta and coronary arteries. Central pulmonary vasculature is within normal limits. Mediastinum/Nodes: No new or enlarging lymph  nodes within the chest. Thyroid, trachea, and esophagus demonstrate no significant findings. No mediastinal hematoma. No pneumomediastinum. Lungs/Pleura: Small-moderate-sized right hemothorax, increased from prior. Minimal air within the right pleural space. Near-complete right lower lobe atelectasis/consolidation, significantly progressed. Trace left pleural effusion with new dependent opacities in the left lower lobe and lingula. Background of mild-to-moderate emphysema. Upper Abdomen: No new or acute findings within the imaged upper abdomen. Musculoskeletal: Redemonstration of multiple acute right-sided rib fractures including the right eighth through twelfth ribs as well as the right T11 transverse process. The degree of displacement of the right eighth rib fracture has increased since prior (series 2, image 107). Posterior paraspinal intramuscular hematoma centered at the posterior right eleventh rib fracture measures approximately 8.0 x 3.4 x 7.5 cm, similar in size to the previous study. No subcutaneous emphysema of the chest wall. IMPRESSION: 1. Small-moderate-sized right hemothorax, increased from prior study. Minimal air within the right pleural space. 2. Near-complete right lower lobe atelectasis/consolidation, significantly progressed since prior study. 3. Trace left pleural effusion with new dependent opacities in the left lower lobe and lingula, which may represent atelectasis or developing pneumonia. 4. Redemonstration of multiple acute right-sided rib fractures including the right eighth through twelfth ribs as well as the right T11 transverse process. The degree of displacement of the right eighth rib fracture has increased since prior study. 5. Posterior paraspinal intramuscular hematoma centered at the posterior right eleventh rib fracture measures approximately 8.0 x 3.4 x 7.5 cm, similar in size to the previous study. 6. Aortic and coronary artery atherosclerosis (ICD10-I70.0). 7. Thoracic  aortic arch measures 3.8 cm in diameter. Recommend annual imaging followup by CTA or MRA. This recommendation follows 2010 ACCF/AHA/AATS/ACR/ASA/SCA/SCAI/SIR/STS/SVM Guidelines for the Diagnosis and Management of Patients with Thoracic Aortic Disease. Circulation.2010; 121: X448-J856. Aortic aneurysm NOS (ICD10-I71.9) Electronically Signed   By: Davina Poke D.O.   On: 06/21/2022 09:46   DG Chest Port 1 View  Result Date: 06/20/2022 CLINICAL DATA:  Right hemothorax. EXAM: PORTABLE CHEST 1 VIEW COMPARISON:  June 19, 2022. FINDINGS: The heart size and mediastinal contours are within normal limits. Mild bibasilar subsegmental atelectasis is  noted. No definite pneumothorax or pleural effusion is noted. The visualized skeletal structures are unremarkable. IMPRESSION: Mild bibasilar subsegmental atelectasis. Electronically Signed   By: Marijo Conception M.D.   On: 06/20/2022 14:12   CT CHEST ABDOMEN PELVIS W CONTRAST  Result Date: 06/19/2022 CLINICAL DATA:  Blunt trauma. Fall at home in the shower. Right flank pain. EXAM: CT CHEST, ABDOMEN, AND PELVIS WITH CONTRAST TECHNIQUE: Multidetector CT imaging of the chest, abdomen and pelvis was performed following the standard protocol during bolus administration of intravenous contrast. RADIATION DOSE REDUCTION: This exam was performed according to the departmental dose-optimization program which includes automated exposure control, adjustment of the mA and/or kV according to patient size and/or use of iterative reconstruction technique. CONTRAST:  130mL OMNIPAQUE IOHEXOL 300 MG/ML  SOLN COMPARISON:  Chest radiograph earlier today. Chest CT 04/12/2022 FINDINGS: CT CHEST FINDINGS Cardiovascular: No evidence of acute aortic or vascular injury. Moderate aortic atherosclerosis and tortuosity coronary artery calcifications versus stents. The heart is normal in size. No pericardial effusion. Mediastinum/Nodes: Mediastinal hemorrhage or hematoma. No pneumomediastinum. Small  mediastinal lymph nodes are all subcentimeter short axis, for example lower paratracheal node measures 7 mm series 2, image 19. Prominent right hilar node, series 2, image 24, partially motion obscured but similar to prior chest CT. No esophageal wall thickening. No visible thyroid nodule. Lungs/Pleura: No pneumothorax. Small right hemothorax with adjacent atelectasis or contusion in the right lower lobe. Background emphysema. Irregular nodular density in the basilar left lower lobe, series 4, image 99 measures 1.7 x 1.2 cm, previously 2.5 x 1.1 cm. Improvement but persistent heterogeneous ground-glass opacities. No endobronchial lesion. Musculoskeletal: Multiple posterior lower rib fractures. There are fractures of the right posterior eighth through twelfth ribs; the ninth, tenth, and eleventh rib fractures are displaced and segmental. There is also fracture of the right T11 transverse process. No fracture extension into the vertebral body, middle or anterior columns. No left rib fractures. The sternum, included clavicles and shoulder girdles are intact. Large intramuscular hematoma within the right paraspinal as well as posterior flank musculature. Overlying subcutaneous edema. CT ABDOMEN PELVIS FINDINGS Hepatobiliary: Despite overlying right rib fractures, there is no evidence of hepatic injury or perihepatic hematoma. No focal liver abnormality. Mild gallbladder distention without calcified gallstone or pericholecystic inflammation. Common bile duct measures 7 mm, likely normal for age. Pancreas: No evidence of injury. No ductal dilatation or inflammation. Spleen: No splenic injury or perisplenic hematoma. Adrenals/Urinary Tract: 17 mm right adrenal nodule with Hounsfield units of 38, however on prior exam this with low density and consistent with benign adenoma. No evidence of adrenal hemorrhage. Slight thickening of the right pararenal fascia of but no renal injury. No pararenal hematoma. Early excretion of IV  contrast in both renal collecting systems. No suspicious renal lesion. The urinary bladder is partially distended, no bladder injury. Stomach/Bowel: No evidence of bowel injury or mesenteric hematoma. No bowel wall thickening or inflammation. No obstruction. Moderate volume of stool throughout the colon. Moderate rectal distention with stool. Occasional left colonic diverticula without diverticulitis. Vascular/Lymphatic: No vascular injury. No retroperitoneal fluid. Irregular calcified and noncalcified atheromatous plaque throughout the abdominal aorta. Patent portal vein. No abdominopelvic adenopathy. Reproductive: Prostate is unremarkable. Other: Subcutaneous contusion involving the right flank. There is also intramuscular hematoma involving the posterior flank musculature. No intra-abdominal hematoma or free air. Musculoskeletal: No acute fracture of the lumbar spine or pelvis. L5 is sacralized. The pubic symphysis is fused. There is a bone island in the right iliac bone. IMPRESSION:  1. Multiple right posterior lower rib fractures from rib 8 through 12. The ninth, tenth, and eleventh rib fractures are displaced and segmental. There is also fracture of the right T11 transverse process. 2. Small right hemothorax with adjacent atelectasis or contusion in the right lower lobe. No pneumothorax. 3. Large intramuscular hematoma within the right paraspinal and posterior flank musculature. 4. No evidence of acute traumatic injury to the abdomen or pelvis. 5. Irregular nodular density in the basilar left lower lobe persists, however is slightly decreased in size from prior exam. It is unclear if decrease in size is due to differences in slice selection or screw decrease in size from prior exam. Recommend additional follow-up chest CT or PET CT in 3 months. Aortic Atherosclerosis (ICD10-I70.0) and Emphysema (ICD10-J43.9). Electronically Signed   By: Keith Rake M.D.   On: 06/19/2022 15:13               LOS:  1 day   Evalin Shawhan  [facility]   Pager on [email or web address]. If 7PM-7AM, please contact night-coverage at [email or web address]     06/21/2022, 12:26 PM

## 2022-06-21 NOTE — Progress Notes (Signed)
Cardiac Individual Treatment Plan  Patient Details  Name: Raymond Sampson MRN: [id number] Date of Birth: November 23, 1940 Referring Provider:   Flowsheet Row Cardiac Rehab from 05/17/2022 in [facility] Cardiac and Pulmonary Rehab  Referring Provider Dr Nelva Bush, MD       Initial Encounter Date:  Flowsheet Row Cardiac Rehab from 05/17/2022 in [facility] Cardiac and Pulmonary Rehab  Date 05/17/22       Visit Diagnosis: NSTEMI (non-ST elevation myocardial infarction) [facility])  Status post coronary artery stent placement  Patient's Home Medications on Admission: No current facility-administered medications for this visit. No current outpatient medications on file.  Facility-Administered Medications Ordered in Other Visits:    0.9 %  sodium chloride infusion (Manually program via Guardrails IV Fluids), , Intravenous, Once, Jennye Boroughs, MD   0.9 %  sodium chloride infusion, , Intravenous, Continuous, Ernestina Patches Micah Flesher, MD, Last Rate: 50 mL/hr at 06/20/22 1056, New Bag at 06/20/22 1056   acetaminophen (TYLENOL) tablet 650 mg, 650 mg, Oral, Q6H PRN, Jennye Boroughs, MD   aspirin EC tablet 81 mg, 81 mg, Oral, Daily, Dunn, Ryan M, PA-C, 81 mg at 06/20/22 1040   atorvastatin (LIPITOR) tablet 80 mg, 80 mg, Oral, Daily, Deneise Lever, MD, 80 mg at 06/20/22 1043   insulin aspart (novoLOG) injection 0-5 Units, 0-5 Units, Subcutaneous, QHS, Deneise Lever, MD   insulin aspart (novoLOG) injection 0-9 Units, 0-9 Units, Subcutaneous, TID WC, Deneise Lever, MD, 2 Units at 06/20/22 1808   lisinopril (ZESTRIL) tablet 20 mg, 20 mg, Oral, Daily, Deneise Lever, MD, 20 mg at 06/20/22 1039   metoprolol tartrate (LOPRESSOR) tablet 25 mg, 25 mg, Oral, BID, Deneise Lever, MD, 25 mg at 06/20/22 2141   morphine (PF) 2 MG/ML injection 2 mg, 2 mg, Intravenous, Q4H PRN, Jennye Boroughs, MD   ondansetron (ZOFRAN) injection 4 mg, 4 mg, Intravenous, Once, Lavonia Drafts, MD   ondansetron [facility]) tablet 4 mg, 4 mg,  Oral, Q6H PRN **OR** ondansetron (ZOFRAN) injection 4 mg, 4 mg, Intravenous, Q6H PRN, Deneise Lever, MD   oxyCODONE (Oxy IR/ROXICODONE) immediate release tablet 5 mg, 5 mg, Oral, Q6H PRN, Jennye Boroughs, MD   pantoprazole (PROTONIX) EC tablet 20 mg, 20 mg, Oral, Daily, Deneise Lever, MD, 20 mg at 06/20/22 1039   sertraline (ZOLOFT) tablet 25 mg, 25 mg, Oral, Daily, Deneise Lever, MD, 25 mg at 06/20/22 1042  Past Medical History: Past Medical History:  Diagnosis Date   Arthritic-like pain    CAD (coronary artery disease)    a. apical MI 5/07 with LHC showing 50% pCFX, 30% pRCA, 99% pLAD, 99% mLAD, 75% dLAD (cypher DES x3 to LAD); b. ETT-myoview (12/08) 81% MPHR,  EF 64%, partially reversible inferoapical perfusion defect similar to prior study 12/07; c. 04/2016 MV: EF 59%, fixed anteroapical defect, no ischemia->Low risk.   Carotid arterial disease ([place])    a. 05/2016 Carotid U/S: 30-40% bilat dzs, f/u in 2 yrs.   Chest pain 04/13/2022   CVA (cerebral vascular accident) [facility])    a. 07/2010 - h/o CVA/TIA.   Diet-controlled type 2 diabetes mellitus (HCC)    HLD (hyperlipidemia)    HTN (hypertension)    [name] (paroxysmal atrial tachycardia)    a. 06/2016 Event monitor:  rare episodes of atrial tachycardia, longest 7 beats. No afib.    Tobacco Use: Social History   Tobacco Use  Smoking Status Former   Packs/day: 1.00   Years: 25.00   Additional pack years: 0.00  Total pack years: 25.00   Types: Cigarettes   Quit date: 04/17/1985   Years since quitting: 37.2  Smokeless Tobacco Never    Labs: Review Flowsheet  More data exists      Latest Ref Rng & Units 05/24/2009 12/28/2012 05/29/2019 04/12/2022 04/13/2022  Labs for ITP Cardiac and Pulmonary Rehab  Cholestrol 0 - 200 mg/dL 123  138  132  - 205   LDL (calc) 0 - 99 mg/dL 58  65  64  - 114   HDL-C >40 mg/dL 38  34  34  - 40   Trlycerides <150 mg/dL 133  197  204  - 257   Hemoglobin A1c 4.8 - 5.6 % - - - 6.8  -     Exercise  Target Goals: Exercise Program Goal: Individual exercise prescription set using results from initial 6 min walk test and THRR while considering  patient's activity barriers and safety.   Exercise Prescription Goal: Initial exercise prescription builds to 30-45 minutes a day of aerobic activity, 2-3 days per week.  Home exercise guidelines will be given to patient during program as part of exercise prescription that the participant will acknowledge.   Education: Aerobic Exercise: - Group verbal and visual presentation on the components of exercise prescription. Introduces F.I.T.T principle from ACSM for exercise prescriptions.  Reviews F.I.T.T. principles of aerobic exercise including progression. Written material given at graduation. Flowsheet Row Cardiac Rehab from 06/14/2022 in [facility] Cardiac and Pulmonary Rehab  Education need identified 05/17/22       Education: Resistance Exercise: - Group verbal and visual presentation on the components of exercise prescription. Introduces F.I.T.T principle from ACSM for exercise prescriptions  Reviews F.I.T.T. principles of resistance exercise including progression. Written material given at graduation.    Education: Exercise & Equipment Safety: - Individual verbal instruction and demonstration of equipment use and safety with use of the equipment. Flowsheet Row Cardiac Rehab from 06/14/2022 in [facility] Cardiac and Pulmonary Rehab  Date 05/17/22  Educator NT  Instruction Review Code 1- Verbalizes Understanding       Education: Exercise Physiology & General Exercise Guidelines: - Group verbal and written instruction with models to review the exercise physiology of the cardiovascular system and associated critical values. Provides general exercise guidelines with specific guidelines to those with heart or lung disease.    Education: Flexibility, Balance, Mind/Body Relaxation: - Group verbal and visual presentation with interactive activity on the  components of exercise prescription. Introduces F.I.T.T principle from ACSM for exercise prescriptions. Reviews F.I.T.T. principles of flexibility and balance exercise training including progression. Also discusses the mind body connection.  Reviews various relaxation techniques to help reduce and manage stress (i.e. Deep breathing, progressive muscle relaxation, and visualization). Balance handout provided to take home. Written material given at graduation.   Activity Barriers & Risk Stratification:  Activity Barriers & Cardiac Risk Stratification - 05/17/22 1538       Activity Barriers & Cardiac Risk Stratification   Activity Barriers None    Cardiac Risk Stratification High             6 Minute Walk:  6 Minute Walk     Row Name 05/17/22 1533         6 Minute Walk   Phase Initial     Distance 815 feet     Walk Time 5.78 minutes     # of Rest Breaks 2     MPH 1.54     METS 2.02     RPE  12     Perceived Dyspnea  0     VO2 Peak 7.06     Symptoms No     Resting HR 67 bpm     Resting BP 142/60     Resting Oxygen Saturation  100 %     Exercise Oxygen Saturation  during 6 min walk 95 %     Max Ex. HR 96 bpm     Max Ex. BP 172/74     2 Minute Post BP 160/68              Oxygen Initial Assessment:   Oxygen Re-Evaluation:   Oxygen Discharge (Final Oxygen Re-Evaluation):   Initial Exercise Prescription:  Initial Exercise Prescription - 05/17/22 1500       Date of Initial Exercise RX and Referring Provider   Date 05/17/22    Referring Provider Dr Nelva Bush, MD      Oxygen   Maintain Oxygen Saturation 88% or higher      Treadmill   MPH 1.2    Grade 0    Minutes 15    METs 1.92      Recumbant Bike   Level 1    RPM 50    Watts 15    Minutes 15    METs 2.02      NuStep   Level 1    SPM 80    Minutes 15    METs 2.02      Prescription Details   Frequency (times per week) 3    Duration Progress to 30 minutes of continuous aerobic without  signs/symptoms of physical distress      Intensity   THRR 40-80% of Max Heartrate 95-124    Ratings of Perceived Exertion 11-13    Perceived Dyspnea 0-4      Progression   Progression Continue to progress workloads to maintain intensity without signs/symptoms of physical distress.      Resistance Training   Training Prescription Yes    Weight 3 lb    Reps 10-15             Perform Capillary Blood Glucose checks as needed.  Exercise Prescription Changes:   Exercise Prescription Changes     Row Name 05/17/22 1500 06/01/22 1400 06/13/22 1300 06/14/22 1300       Response to Exercise   Blood Pressure (Admit) 142/60 110/64 132/62 --    Blood Pressure (Exercise) 172/74 140/70 128/70 --    Blood Pressure (Exit) 160/68 104/66 122/62 --    Heart Rate (Admit) 67 bpm 64 bpm 69 bpm --    Heart Rate (Exercise) 96 bpm 111 bpm 94 bpm --    Heart Rate (Exit) 70 bpm 76 bpm 69 bpm --    Oxygen Saturation (Admit) 100 % -- -- --    Oxygen Saturation (Exercise) 95 % -- -- --    Rating of Perceived Exertion (Exercise) 12 14 15  --    Perceived Dyspnea (Exercise) 0 -- -- --    Symptoms none none none --    Comments 6MWT Results First three days of exercise -- --    Duration -- Progress to 30 minutes of  aerobic without signs/symptoms of physical distress Progress to 30 minutes of  aerobic without signs/symptoms of physical distress --    Intensity -- THRR unchanged THRR unchanged --      Progression   Progression -- Continue to progress workloads to maintain intensity without signs/symptoms of physical distress. Continue to progress  workloads to maintain intensity without signs/symptoms of physical distress. --    Average METs -- 1.4 1.57 --      Resistance Training   Training Prescription -- Yes Yes --    Weight -- 3 lb 3 lb --    Reps -- 10-15 10-15 --      Interval Training   Interval Training -- No No --      Treadmill   MPH -- -- 0.7 --    Grade -- -- 0 --    Minutes -- -- 15  --    METs -- -- 1.5 --      NuStep   Level -- 1 3 --    Minutes -- 15 15 --    METs -- 1.8 1.8 --      T5 Nustep   Level -- 1 -- --    Minutes -- 15 -- --    METs -- 1.7 -- --      Biostep-RELP   Level -- 1 1 --    Minutes -- 15 15 --    METs -- 1 -- --      Track   Laps -- 7 6 --    Minutes -- 15 15 --    METs -- 1.38 1.33 --      Home Exercise Plan   Plans to continue exercise at -- -- -- Home (comment)  walking    Frequency -- -- -- Add 2 additional days to program exercise sessions.    Initial Home Exercises Provided -- -- -- 06/14/22      Oxygen   Maintain Oxygen Saturation -- 88% or higher 88% or higher --             Exercise Comments:   Exercise Comments     Row Name 05/22/22 1336           Exercise Comments First full day of exercise!  Patient was oriented to gym and equipment including functions, settings, policies, and procedures.  Patient's individual exercise prescription and treatment plan were reviewed.  All starting workloads were established based on the results of the 6 minute walk test done at initial orientation visit.  The plan for exercise progression was also introduced and progression will be customized based on patient's performance and goals.                Exercise Goals and Review:   Exercise Goals     Row Name 05/17/22 1538             Exercise Goals   Increase Physical Activity Yes       Intervention Provide advice, education, support and counseling about physical activity/exercise needs.;Develop an individualized exercise prescription for aerobic and resistive training based on initial evaluation findings, risk stratification, comorbidities and participant's personal goals.       Expected Outcomes Short Term: Attend rehab on a regular basis to increase amount of physical activity.;Long Term: Add in home exercise to make exercise part of routine and to increase amount of physical activity.;Long Term: Exercising  regularly at least 3-5 days a week.       Increase Strength and Stamina Yes       Intervention Develop an individualized exercise prescription for aerobic and resistive training based on initial evaluation findings, risk stratification, comorbidities and participant's personal goals.;Provide advice, education, support and counseling about physical activity/exercise needs.       Expected Outcomes Short Term: Perform resistance training exercises routinely  during rehab and add in resistance training at home;Long Term: Improve cardiorespiratory fitness, muscular endurance and strength as measured by increased METs and functional capacity (6MWT);Short Term: Increase workloads from initial exercise prescription for resistance, speed, and METs.       Able to understand and use rate of perceived exertion (RPE) scale Yes       Intervention Provide education and explanation on how to use RPE scale       Expected Outcomes Short Term: Able to use RPE daily in rehab to express subjective intensity level;Long Term:  Able to use RPE to guide intensity level when exercising independently       Able to understand and use Dyspnea scale Yes       Intervention Provide education and explanation on how to use Dyspnea scale       Expected Outcomes Short Term: Able to use Dyspnea scale daily in rehab to express subjective sense of shortness of breath during exertion;Long Term: Able to use Dyspnea scale to guide intensity level when exercising independently       Knowledge and understanding of Target Heart Rate Range (THRR) Yes       Intervention Provide education and explanation of THRR including how the numbers were predicted and where they are located for reference       Expected Outcomes Long Term: Able to use THRR to govern intensity when exercising independently;Short Term: Able to state/look up THRR;Short Term: Able to use daily as guideline for intensity in rehab       Able to check pulse independently Yes        Intervention Review the importance of being able to check your own pulse for safety during independent exercise;Provide education and demonstration on how to check pulse in carotid and radial arteries.       Expected Outcomes Short Term: Able to explain why pulse checking is important during independent exercise;Long Term: Able to check pulse independently and accurately       Understanding of Exercise Prescription Yes       Intervention Provide education, explanation, and written materials on patient's individual exercise prescription       Expected Outcomes Short Term: Able to explain program exercise prescription;Long Term: Able to explain home exercise prescription to exercise independently                Exercise Goals Re-Evaluation :  Exercise Goals Re-Evaluation     Row Name 05/22/22 1337 05/31/22 1357 06/01/22 1528 06/13/22 1354 06/14/22 1345     Exercise Goal Re-Evaluation   Exercise Goals Review Increase Physical Activity;Able to understand and use rate of perceived exertion (RPE) scale;Knowledge and understanding of Target Heart Rate Range (THRR);Understanding of Exercise Prescription;Increase Strength and Stamina;Able to understand and use Dyspnea scale Increase Physical Activity;Increase Strength and Stamina;Understanding of Exercise Prescription Increase Physical Activity;Increase Strength and Stamina;Understanding of Exercise Prescription Increase Physical Activity;Increase Strength and Stamina;Understanding of Exercise Prescription Increase Physical Activity;Increase Strength and Stamina;Understanding of Exercise Prescription;Able to understand and use rate of perceived exertion (RPE) scale;Knowledge and understanding of Target Heart Rate Range (THRR);Able to understand and use Dyspnea scale;Able to check pulse independently   Comments Reviewed RPE scale, THR and program prescription with pt today.  Pt voiced understanding and was given a copy of goals to take home. Lexus is doing  well. Stafff will hold off on going over home exercise as patient just started the program. Explained the importance of starting home exercise and staying compliant with it during  rehab for positive results. Encouraged patient to always have a buddy with him with exercise, he states his wife will be with him and they plan on taking walks together. He does not use a cane or walker. Discussed importance checking HR at home as well. Dora is off to a good start in the program. He had an average MET level of 1.4 METs during his first week in the program. He also did level one on the T4 and T5 nustep as well as the biostep. He was only able to walk the track for 10 minutes and got seven laps. We will continue to monitor his progress in the program. Kweku continues to do well in rehab. He tried out the treadmill and was able to walk at a 0.7 mph speed. He also increased to level 3 on the T4 Nustep! He has been hitting his THR only some sessions. We hope to see his walking improve over time. RPEs are appropriate. Will continue to monitor. Reviewed home exercise with pt today.  Pt plans to walk around yard at home for exercise.  Reviewed THR, pulse, RPE, sign and symptoms, pulse oximetery and when to call 911 or MD.  Also discussed weather considerations and indoor options.  Pt voiced understanding.   Expected Outcomes Short: Use RPE daily to regulate intensity.  Long: Follow program prescription in THR. ShorT: EP to go over home exercise Long: Exercise independently at home at appropriate prescription Short: Continue to work towards 20 minutes of walking. Long: Continue to follow exercise prescription. Short: Increase laps on track, fulfill 20 minutes of walking all together Long: Continue to increase overall MET level and stamina Short: Start to add in more walking at home Long: continue to exercise independently            Discharge Exercise Prescription (Final Exercise Prescription Changes):  Exercise  Prescription Changes - 06/14/22 1300       Home Exercise Plan   Plans to continue exercise at Home (comment)   walking   Frequency Add 2 additional days to program exercise sessions.    Initial Home Exercises Provided 06/14/22             Nutrition:  Target Goals: Understanding of nutrition guidelines, daily intake of sodium 1500mg , cholesterol 200mg , calories 30% from fat and 7% or less from saturated fats, daily to have 5 or more servings of fruits and vegetables.  Education: All About Nutrition: -Group instruction provided by verbal, written material, interactive activities, discussions, models, and posters to present general guidelines for heart healthy nutrition including fat, fiber, MyPlate, the role of sodium in heart healthy nutrition, utilization of the nutrition label, and utilization of this knowledge for meal planning. Follow up email sent as well. Written material given at graduation.   Biometrics:  Pre Biometrics - 05/17/22 1538       Pre Biometrics   Height 5' 8.5" (1.74 m)    Weight 152 lb (68.9 kg)    Waist Circumference 36 inches    Hip Circumference 36 inches    Waist to Hip Ratio 1 %    BMI (Calculated) 22.77    Single Leg Stand 1.6 seconds              Nutrition Therapy Plan and Nutrition Goals:  Nutrition Therapy & Goals - 05/31/22 1352       Nutrition Therapy   RD appointment deferred Yes   Deferred  Nutrition Assessments:  MEDIFICTS Score Key: ?70 Need to make dietary changes  40-70 Heart Healthy Diet ? 40 Therapeutic Level Cholesterol Diet  Flowsheet Row Cardiac Rehab from 05/17/2022 in [facility] Cardiac and Pulmonary Rehab  Picture Your Plate Total Score on Admission 56      Picture Your Plate Scores: [id number] Unhealthy dietary pattern with much room for improvement. 41-50 Dietary pattern unlikely to meet recommendations for good health and room for improvement. 51-60 More healthful dietary pattern, with some room for  improvement.  >60 Healthy dietary pattern, although there may be some specific behaviors that could be improved.    Nutrition Goals Re-Evaluation:  Nutrition Goals Re-Evaluation     [place] Name 05/31/22 1410             Goals   Nutrition Goal Patient deferred nutrition at this time.                Nutrition Goals Discharge (Final Nutrition Goals Re-Evaluation):  Nutrition Goals Re-Evaluation - 05/31/22 1410       Goals   Nutrition Goal Patient deferred nutrition at this time.             Psychosocial: Target Goals: Acknowledge presence or absence of significant depression and/or stress, maximize coping skills, provide positive support system. Participant is able to verbalize types and ability to use techniques and skills needed for reducing stress and depression.   Education: Stress, Anxiety, and Depression - Group verbal and visual presentation to define topics covered.  Reviews how body is impacted by stress, anxiety, and depression.  Also discusses healthy ways to reduce stress and to treat/manage anxiety and depression.  Written material given at graduation. Flowsheet Row Cardiac Rehab from 06/14/2022 in [facility] Cardiac and Pulmonary Rehab  Date 06/14/22  Educator KW  Instruction Review Code 1- [employer] Understanding       Education: Sleep Hygiene -Provides group verbal and written instruction about how sleep can affect your health.  Define sleep hygiene, discuss sleep cycles and impact of sleep habits. Review good sleep hygiene tips.    Initial Review & Psychosocial Screening:  Initial Psych Review & Screening - 05/11/22 1358       Initial Review   Current issues with Current Psychotropic Meds;Current Anxiety/Panic      Family Dynamics   Good Support System? Yes   wife,daughter in law,   all children   Comments would have anxiety      Barriers   Psychosocial barriers to participate in program There are no identifiable barriers or psychosocial needs.       Screening Interventions   Interventions Encouraged to exercise;To provide support and resources with identified psychosocial needs;Provide feedback about the scores to participant    Expected Outcomes Short Term goal: Utilizing psychosocial counselor, staff and physician to assist with identification of specific Stressors or current issues interfering with healing process. Setting desired goal for each stressor or current issue identified.;Long Term Goal: Stressors or current issues are controlled or eliminated.;Short Term goal: Identification and review with participant of any Quality of Life or Depression concerns found by scoring the questionnaire.;Long Term goal: The participant improves quality of Life and PHQ9 Scores as seen by post scores and/or verbalization of changes             Quality of Life Scores:   Quality of Life - 05/17/22 1526       Quality of Life   Select Quality of Life      Quality of Life  Scores   Health/Function Pre 17.93 %    Socioeconomic Pre 22.75 %    Psych/Spiritual Pre 24.21 %    Family Pre 24.9 %    GLOBAL Pre 21.2 %            Scores of 19 and below usually indicate a poorer quality of life in these areas.  A difference of  2-3 points is a clinically meaningful difference.  A difference of 2-3 points in the total score of the Quality of Life Index has been associated with significant improvement in overall quality of life, self-image, physical symptoms, and general health in studies assessing change in quality of life.  PHQ-9: Review Flowsheet       05/17/2022  Depression screen PHQ 2/9  Decreased Interest 0  Down, Depressed, Hopeless 0  PHQ - 2 Score 0  Altered sleeping 1  Tired, decreased energy 0  Change in appetite 0  Feeling bad or failure about yourself  0  Trouble concentrating 1  Moving slowly or fidgety/restless 0  Suicidal thoughts 0  PHQ-9 Score 2  Difficult doing work/chores Not difficult at all   Interpretation of Total  Score  Total Score Depression Severity:  1-4 = Minimal depression, 5-9 = Mild depression, 10-14 = Moderate depression, 15-19 = Moderately severe depression, 20-27 = Severe depression   Psychosocial Evaluation and Intervention:  Psychosocial Evaluation - 05/11/22 1413       Psychosocial Evaluation & Interventions   Interventions Encouraged to exercise with the program and follow exercise prescription    Comments Rodrecus has no barriers to attending the program. He lives with his wife,[name],of 30 years. She and their children are his support.  He does take Zoloft for history of anxiety. He would worry about others and get stressed.  He does have some short term memory concerns;diagnosis of mild dementia. He is ready to start the program and build up strength and energy.    Expected Outcomes [facility] is able to attend all scheduled sessions. He is able to progress with his exercise  He is able to reduce any anxieties he has. LTG Teriq continues his exercise progression and keep anxiety reduced    Continue Psychosocial Services  Follow up required by staff             Psychosocial Re-Evaluation:  Psychosocial Re-Evaluation     [place] Name 05/31/22 1348             Psychosocial Re-Evaluation   Current issues with None Identified       Comments Prayag states he keeps busy- he has 2 children, 2 greatgranchildren, and a handful of gradnchildren. HE states his sleep is good and does not report any issues with it. His wife is good support and takes care of him with cooking, medications, etc. He is very grateful for her. Denies big stressors. He likes to watch TV to help with stress, though denies currently experiencing any. Still taking Zolofts which works well. Per notes, he experiences some mild dementia. He states he has been enjoying the program so far and is excited to see how his results turn out after finishing. Denies any other concerns at this time.       Expected Outcomes Short:  Continue coming to rehab for mood boost Long: Continue to maintain positive attitude and utilize exercise for stress/anxiety management       Interventions Encouraged to attend Cardiac Rehabilitation for the exercise       Continue Psychosocial Services  Follow up required by staff                Psychosocial Discharge (Final Psychosocial Re-Evaluation):  Psychosocial Re-Evaluation - 05/31/22 1348       Psychosocial Re-Evaluation   Current issues with None Identified    Comments Rogie states he keeps busy- he has 2 children, 2 greatgranchildren, and a handful of gradnchildren. HE states his sleep is good and does not report any issues with it. His wife is good support and takes care of him with cooking, medications, etc. He is very grateful for her. Denies big stressors. He likes to watch TV to help with stress, though denies currently experiencing any. Still taking Zolofts which works well. Per notes, he experiences some mild dementia. He states he has been enjoying the program so far and is excited to see how his results turn out after finishing. Denies any other concerns at this time.    Expected Outcomes Short: Continue coming to rehab for mood boost Long: Continue to maintain positive attitude and utilize exercise for stress/anxiety management    Interventions Encouraged to attend Cardiac Rehabilitation for the exercise    Continue Psychosocial Services  Follow up required by staff             Vocational Rehabilitation: Provide vocational rehab assistance to qualifying candidates.   Vocational Rehab Evaluation & Intervention:  Vocational Rehab - 05/11/22 1406       Initial Vocational Rehab Evaluation & Intervention   Assessment shows need for Vocational Rehabilitation No      Vocational Rehab Re-Evaulation   Comments retired             Education: Education Goals: Education classes will be provided on a variety of topics geared toward better understanding of heart  health and risk factor modification. Participant will state understanding/return demonstration of topics presented as noted by education test scores.  Learning Barriers/Preferences:  Learning Barriers/Preferences - 05/11/22 1404       Learning Barriers/Preferences   Learning Barriers Inability to learn new things   memory retention not good   Learning Preferences None             General Cardiac Education Topics:  AED/CPR: - Group verbal and written instruction with the use of models to demonstrate the basic use of the AED with the basic ABC's of resuscitation.   Anatomy and Cardiac Procedures: - Group verbal and visual presentation and models provide information about basic cardiac anatomy and function. Reviews the testing methods done to diagnose heart disease and the outcomes of the test results. Describes the treatment choices: Medical Management, Angioplasty, or Coronary Bypass Surgery for treating various heart conditions including Myocardial Infarction, Angina, Valve Disease, and Cardiac Arrhythmias.  Written material given at graduation. Flowsheet Row Cardiac Rehab from 06/14/2022 in [facility] Cardiac and Pulmonary Rehab  Education need identified 05/17/22       Medication Safety: - Group verbal and visual instruction to review commonly prescribed medications for heart and lung disease. Reviews the medication, class of the drug, and side effects. Includes the steps to properly store meds and maintain the prescription regimen.  Written material given at graduation. Flowsheet Row Cardiac Rehab from 06/14/2022 in [facility] Cardiac and Pulmonary Rehab  Date 05/24/22  Educator MS  Instruction Review Code 1- Verbalizes Understanding       Intimacy: - Group verbal instruction through game format to discuss how heart and lung disease can affect sexual intimacy. Written material given at graduation.Marland Kitchen  Know Your Numbers and Heart Failure: - Group verbal and visual instruction to  discuss disease risk factors for cardiac and pulmonary disease and treatment options.  Reviews associated critical values for Overweight/Obesity, Hypertension, Cholesterol, and Diabetes.  Discusses basics of heart failure: signs/symptoms and treatments.  Introduces Heart Failure Zone chart for action plan for heart failure.  Written material given at graduation. Flowsheet Row Cardiac Rehab from 06/14/2022 in [facility] Cardiac and Pulmonary Rehab  Date 05/31/22  Educator [facility]  Instruction Review Code 1- Verbalizes Understanding       Infection Prevention: - Provides verbal and written material to individual with discussion of infection control including proper hand washing and proper equipment cleaning during exercise session. Flowsheet Row Cardiac Rehab from 06/14/2022 in [facility] Cardiac and Pulmonary Rehab  Date 05/17/22  Educator NT  Instruction Review Code 1- Verbalizes Understanding       Falls Prevention: - Provides verbal and written material to individual with discussion of falls prevention and safety. Flowsheet Row Cardiac Rehab from 06/14/2022 in [facility] Cardiac and Pulmonary Rehab  Date 05/11/22  Educator SB  Instruction Review Code 1- Verbalizes Understanding       Other: -Provides group and verbal instruction on various topics (see comments)   Knowledge Questionnaire Score:  Knowledge Questionnaire Score - 05/17/22 1524       Knowledge Questionnaire Score   Pre Score 22/26             Core Components/Risk Factors/Patient Goals at Admission:  Personal Goals and Risk Factors at Admission - 05/17/22 1532       Core Components/Risk Factors/Patient Goals on Admission   Diabetes Yes    Intervention Provide education about signs/symptoms and action to take for hypo/hyperglycemia.;Provide education about proper nutrition, including hydration, and aerobic/resistive exercise prescription along with prescribed medications to achieve blood glucose in normal ranges: Fasting glucose  65-99 mg/dL    Expected Outcomes Short Term: Participant verbalizes understanding of the signs/symptoms and immediate care of hyper/hypoglycemia, proper foot care and importance of medication, aerobic/resistive exercise and nutrition plan for blood glucose control.;Long Term: Attainment of HbA1C < 7%.    Hypertension Yes    Intervention Provide education on lifestyle modifcations including regular physical activity/exercise, weight management, moderate sodium restriction and increased consumption of fresh fruit, vegetables, and low fat dairy, alcohol moderation, and smoking cessation.;Monitor prescription use compliance.    Expected Outcomes Short Term: Continued assessment and intervention until BP is < 140/14mm HG in hypertensive participants. < 130/75mm HG in hypertensive participants with diabetes, heart failure or chronic kidney disease.;Long Term: Maintenance of blood pressure at goal levels.    Lipids Yes    Intervention Provide education and support for participant on nutrition & aerobic/resistive exercise along with prescribed medications to achieve LDL 70mg , HDL >40mg .    Expected Outcomes Short Term: Participant states understanding of desired cholesterol values and is compliant with medications prescribed. Participant is following exercise prescription and nutrition guidelines.;Long Term: Cholesterol controlled with medications as prescribed, with individualized exercise RX and with personalized nutrition plan. Value goals: LDL < 70mg , HDL > 40 mg.             Education:Diabetes - Individual verbal and written instruction to review signs/symptoms of diabetes, desired ranges of glucose level fasting, after meals and with exercise. Acknowledge that pre and post exercise glucose checks will be done for 3 sessions at entry of program.   Core Components/Risk Factors/Patient Goals Review:   Goals and Risk Factor Review  [place] Name 05/31/22 1344             Core Components/Risk  Factors/Patient Goals Review   Personal Goals Review Diabetes;Lipids;Hypertension       Review Kazeem states his wife helps check  his sugar. He is not able to tell me where the numbers have been but states he knows they have been good.They also check his blood pressure which ranges 110-120s/60s. Pressures at rehab are good, though he states a have been couple high coming in but think its from walking in and not resting all the way. He states he has been taking his medications with no complaints.       Expected Outcomes Short: Continue to monitor sugars and BP closely, become aware of any abnormal values Long: Continue to manage lifestyle risk factors                Core Components/Risk Factors/Patient Goals at Discharge (Final Review):   Goals and Risk Factor Review - 05/31/22 1344       Core Components/Risk Factors/Patient Goals Review   Personal Goals Review Diabetes;Lipids;Hypertension    Review Kennith states his wife helps check  his sugar. He is not able to tell me where the numbers have been but states he knows they have been good.They also check his blood pressure which ranges 110-120s/60s. Pressures at rehab are good, though he states a have been couple high coming in but think its from walking in and not resting all the way. He states he has been taking his medications with no complaints.    Expected Outcomes Short: Continue to monitor sugars and BP closely, become aware of any abnormal values Long: Continue to manage lifestyle risk factors             ITP Comments:  ITP Comments     Row Name 05/11/22 1412 05/17/22 1523 05/22/22 1336 05/24/22 1524 06/21/22 0941   ITP Comments Virtual orientation call completed today. he has an appointment on Date: ID:[id number]  for EP eval and gym Orientation.  Documentation of diagnosis can be found in [facility] Date: 04/12/2022 . Completed 6MWT and gym orientation. Initial ITP created and sent for review to Dr. Emily Filbert, Medical Director. First  full day of exercise!  Patient was oriented to gym and equipment including functions, settings, policies, and procedures.  Patient's individual exercise prescription and treatment plan were reviewed.  All starting workloads were established based on the results of the 6 minute walk test done at initial orientation visit.  The plan for exercise progression was also introduced and progression will be customized based on patient's performance and goals. 30 day review completed. ITP sent to Dr. Emily Filbert, Medical Director of Cardiac Rehab. Continue with ITP unless changes are made by physician.   Pt is new to program. 30 Day review completed. Medical Director ITP review done, changes made as directed, and signed approval by Medical Director.            Comments:

## 2022-06-21 NOTE — TOC CM/SW Note (Addendum)
Patient not fully oriented. No family at bedside. Left voicemail for wife. Will discuss SNF recommendations when she calls back.  Dayton Scrape, [place]  3:40 pm: No call back so far. Tried calling again but phone goes to voicemail.  Dayton Scrape, [place]

## 2022-06-21 NOTE — Progress Notes (Signed)
PT Cancellation Note  Patient Details Name: Raymond Sampson MRN: [id number] DOB: 1940-09-27   Cancelled Treatment:    Reason Eval/Treat Not Completed: Medical issues which prohibited therapy (Chart reviewed, noted drop in Hb <7, PRBC pending. WIll resume services once pt is medically ready.)  11:05 AM, 06/21/22 Etta Grandchild, PT, DPT Physical Therapist - [facility]  [phone] ([place])    Kellyn Mansfield C 06/21/2022, 11:04 AM

## 2022-06-22 ENCOUNTER — Inpatient Hospital Stay: Payer: PPO

## 2022-06-22 DIAGNOSIS — W19XXXA Unspecified fall, initial encounter: Secondary | ICD-10-CM

## 2022-06-22 DIAGNOSIS — E440 Moderate protein-calorie malnutrition: Secondary | ICD-10-CM | POA: Insufficient documentation

## 2022-06-22 DIAGNOSIS — I25118 Atherosclerotic heart disease of native coronary artery with other forms of angina pectoris: Secondary | ICD-10-CM | POA: Diagnosis not present

## 2022-06-22 DIAGNOSIS — S2241XA Multiple fractures of ribs, right side, initial encounter for closed fracture: Secondary | ICD-10-CM | POA: Diagnosis not present

## 2022-06-22 DIAGNOSIS — S2249XA Multiple fractures of ribs, unspecified side, initial encounter for closed fracture: Secondary | ICD-10-CM | POA: Diagnosis not present

## 2022-06-22 DIAGNOSIS — T148XXA Other injury of unspecified body region, initial encounter: Secondary | ICD-10-CM | POA: Diagnosis not present

## 2022-06-22 DIAGNOSIS — J942 Hemothorax: Secondary | ICD-10-CM | POA: Diagnosis not present

## 2022-06-22 LAB — BASIC METABOLIC PANEL
Anion gap: 6 (ref 5–15)
BUN: 31 mg/dL — ABNORMAL HIGH (ref 8–23)
CO2: 23 mmol/L (ref 22–32)
Calcium: 8.1 mg/dL — ABNORMAL LOW (ref 8.9–10.3)
Chloride: 104 mmol/L (ref 98–111)
Creatinine, Ser: 1.01 mg/dL (ref 0.61–1.24)
GFR, Estimated: >60 mL/min (ref >60)
Glucose, Bld: 219 mg/dL — ABNORMAL HIGH (ref 70–99)
Potassium: 4.1 mmol/L (ref 3.5–5.1)
Sodium: 133 mmol/L — ABNORMAL LOW (ref 135–145)

## 2022-06-22 LAB — CBC WITH DIFFERENTIAL/PLATELET
Abs Immature Granulocytes: 0.05 10*3/uL (ref 0.00–0.07)
Basophils Absolute: 0 10*3/uL (ref 0.0–0.1)
Basophils Relative: 0 %
Eosinophils Absolute: 0 10*3/uL (ref 0.0–0.5)
Eosinophils Relative: 0 %
HCT: 25 % — ABNORMAL LOW (ref 39.0–52.0)
Hemoglobin: 8.3 g/dL — ABNORMAL LOW (ref 13.0–17.0)
Immature Granulocytes: 0 %
Lymphocytes Relative: 6 %
Lymphs Abs: 0.9 10*3/uL (ref 0.7–4.0)
MCH: 30.5 pg (ref 26.0–34.0)
MCHC: 33.2 g/dL (ref 30.0–36.0)
MCV: 91.9 fL (ref 80.0–100.0)
Monocytes Absolute: 1.1 10*3/uL — ABNORMAL HIGH (ref 0.1–1.0)
Monocytes Relative: 8 %
Neutro Abs: 11.8 10*3/uL — ABNORMAL HIGH (ref 1.7–7.7)
Neutrophils Relative %: 86 %
Platelets: 269 10*3/uL (ref 150–400)
RBC: 2.72 MIL/uL — ABNORMAL LOW (ref 4.22–5.81)
RDW: 13.8 % (ref 11.5–15.5)
WBC: 13.8 10*3/uL — ABNORMAL HIGH (ref 4.0–10.5)
nRBC: 0 % (ref 0.0–0.2)

## 2022-06-22 LAB — GLUCOSE, CAPILLARY
Glucose-Capillary: 155 mg/dL — ABNORMAL HIGH (ref 70–99)
Glucose-Capillary: 167 mg/dL — ABNORMAL HIGH (ref 70–99)
Glucose-Capillary: 197 mg/dL — ABNORMAL HIGH (ref 70–99)
Glucose-Capillary: 151 mg/dL — ABNORMAL HIGH (ref 70–99)

## 2022-06-22 LAB — CBC
HCT: 24.5 % — ABNORMAL LOW (ref 39.0–52.0)
Hemoglobin: 8 g/dL — ABNORMAL LOW (ref 13.0–17.0)
MCH: 30.1 pg (ref 26.0–34.0)
MCHC: 32.7 g/dL (ref 30.0–36.0)
MCV: 92.1 fL (ref 80.0–100.0)
Platelets: 232 10*3/uL (ref 150–400)
RBC: 2.66 MIL/uL — ABNORMAL LOW (ref 4.22–5.81)
RDW: 13.9 % (ref 11.5–15.5)
WBC: 10.7 10*3/uL — ABNORMAL HIGH (ref 4.0–10.5)
nRBC: 0 % (ref 0.0–0.2)

## 2022-06-22 MED ORDER — POLYVINYL ALCOHOL 1.4 % OP SOLN
1.0000 [drp] | OPHTHALMIC | Status: DC | PRN
  Filled 2022-06-22: qty 15

## 2022-06-22 MED ORDER — ADULT MULTIVITAMIN W/MINERALS CH
1.0000 | ORAL_TABLET | Freq: Every day | ORAL | Status: DC
  Administered 2022-06-22 – 2022-07-03 (×12): 1 via ORAL
  Filled 2022-06-22 (×12): qty 1

## 2022-06-22 MED ORDER — ENSURE ENLIVE PO LIQD
237.0000 mL | Freq: Three times a day (TID) | ORAL | Status: DC
  Administered 2022-06-22 – 2022-07-02 (×28): 237 mL via ORAL

## 2022-06-22 NOTE — Progress Notes (Signed)
Rounding Note    Patient Name: Raymond Sampson Date of Encounter: 06/22/2022  [place] Cardiologist: Ida Rogue, MD   Subjective   Sitting up on the side of bed, working with PT, reports having significant right flank pain Hemoglobin stable  Inpatient Medications    Scheduled Meds:  aspirin EC  81 mg Oral Daily   atorvastatin  80 mg Oral Daily   feeding supplement  237 mL Oral TID BM   insulin aspart  0-5 Units Subcutaneous QHS   insulin aspart  0-9 Units Subcutaneous TID WC   lisinopril  20 mg Oral Daily   metoprolol tartrate  25 mg Oral BID   multivitamin with minerals  1 tablet Oral Daily   ondansetron (ZOFRAN) IV  4 mg Intravenous Once   pantoprazole  20 mg Oral Daily   sertraline  25 mg Oral Daily   Continuous Infusions:  sodium chloride 50 mL/hr at 06/20/22 1056   PRN Meds: acetaminophen, morphine injection, ondansetron **OR** ondansetron (ZOFRAN) IV, oxyCODONE, polyvinyl alcohol   Vital Signs    Vitals:   06/21/22 1720 06/21/22 2118 06/21/22 2123 06/22/22 0743  BP: (!) 137/59 (!) 157/66 (!) 157/66 (!) 152/64  Pulse: 97 83 90 92  Resp: 16   16  Temp: 97.9 F (36.6 C)  98.6 F (37 C) 97.7 F (36.5 C)  TempSrc: Axillary  Oral Oral  SpO2: 96%  94% 91%  Weight:      Height:        Intake/Output Summary (Last 24 hours) at 06/22/2022 1326 Last data filed at 06/21/2022 2123 Gross per 24 hour  Intake 300 ml  Output 400 ml  Net -100 ml      06/19/2022    9:24 PM 05/17/2022    3:38 PM 05/10/2022    1:45 PM  Last 3 Weights  Weight (lbs) 153 lb 7 oz 152 lb 152 lb 12.8 oz  Weight (kg) 69.6 kg 68.947 kg 69.31 kg      Telemetry     - Personally Reviewed  ECG     - Personally Reviewed  Physical Exam   GEN: No acute distress.   Neck: No JVD Cardiac: RRR, no murmurs, rubs, or gallops.  Respiratory: Clear to auscultation bilaterally. GI: Soft, nontender, non-distended  MS: No edema; No deformity.  Significant ecchymotic bruising  right flank extending into right hip Neuro:  Nonfocal  Psych: Normal affect   Labs    High Sensitivity Troponin:  No results for input(s): "TROPONINIHS" in the last 720 hours.   Chemistry Recent Labs  Lab 06/19/22 1145 06/20/22 0425  NA 135 134*  K 4.9 4.3  CL 97* 98  CO2 25 24  GLUCOSE 231* 171*  BUN 21 25*  CREATININE 1.29* 1.17  CALCIUM 8.8* 8.7*  PROT 6.7 6.5  ALBUMIN 3.4* 3.4*  AST 39 31  ALT 20 21  ALKPHOS 73 62  BILITOT 0.7 0.9  GFRNONAA 56* >60  ANIONGAP 13 12    Lipids No results for input(s): "CHOL", "TRIG", "HDL", "LABVLDL", "LDLCALC", "CHOLHDL" in the last 168 hours.  Hematology Recent Labs  Lab 06/21/22 0510 06/21/22 2148 06/22/22 0704  WBC 10.5 9.7 10.7*  RBC 2.22* 2.58* 2.66*  HGB 6.7* 7.9* 8.0*  HCT 20.7* 23.7* 24.5*  MCV 93.2 91.9 92.1  MCH 30.2 30.6 30.1  MCHC 32.4 33.3 32.7  RDW 14.1 13.8 13.9  PLT 235 226 232   Thyroid No results for input(s): "TSH", "FREET4" in the last  168 hours.  BNPNo results for input(s): "BNP", "PROBNP" in the last 168 hours.  DDimer No results for input(s): "DDIMER" in the last 168 hours.   Radiology    DG CHEST PORT 1 VIEW  Result Date: 06/22/2022 CLINICAL DATA:  Pneumothorax EXAM: PORTABLE CHEST 1 VIEW COMPARISON:  06/20/2022. FINDINGS: Right base opacity suggests layering effusion. Lungs are otherwise clear. No pneumothorax identified. Normal pulmonary vasculature. Normal cardiac silhouette. Fractures identified of the right posterior tenth and eleventh ribs. Additional fractures described on CT are not evident on plain film. IMPRESSION: Right base consolidation or volume loss and effusion. Electronically Signed   By: Sammie Bench M.D.   On: 06/22/2022 08:54   CT HEAD WO CONTRAST (5MM)  Result Date: 06/21/2022 CLINICAL DATA:  Fall, bleeding EXAM: CT HEAD WITHOUT CONTRAST TECHNIQUE: Contiguous axial images were obtained from the base of the skull through the vertex without intravenous contrast. RADIATION DOSE  REDUCTION: This exam was performed according to the departmental dose-optimization program which includes automated exposure control, adjustment of the mA and/or kV according to patient size and/or use of iterative reconstruction technique. COMPARISON:  06/05/2017 FINDINGS: Brain: No evidence of acute infarction, hemorrhage, mass, mass effect, or midline shift. No hydrocephalus or extra-axial fluid collection. Redemonstrated encephalomalacia in the left parieto-occipital region. Left posterior fossa arachnoid cyst. Periventricular white matter changes, likely the sequela of chronic small vessel ischemic disease. Vascular: No hyperdense vessel. Atherosclerotic calcifications in the intracranial carotid and vertebral arteries. Skull: Negative for fracture or focal lesion. Sinuses/Orbits: Mucosal thickening in the maxillary sinuses. No acute finding in the orbits. Other: The mastoid air cells are well aerated. IMPRESSION: No acute intracranial process. Electronically Signed   By: Merilyn Baba M.D.   On: 06/21/2022 14:34   CT CHEST WO CONTRAST  Result Date: 06/21/2022 CLINICAL DATA:  Right hemothorax, anemia EXAM: CT CHEST WITHOUT CONTRAST TECHNIQUE: Multidetector CT imaging of the chest was performed following the standard protocol without IV contrast. RADIATION DOSE REDUCTION: This exam was performed according to the departmental dose-optimization program which includes automated exposure control, adjustment of the mA and/or kV according to patient size and/or use of iterative reconstruction technique. COMPARISON:  CT 06/19/2022 FINDINGS: Cardiovascular: Heart size is normal. No pericardial effusion. Relative hypoattenuation of the cardiac blood pool indicative of anemia. Thoracic aortic arch measures 3.8 cm in diameter. Atherosclerotic calcification of the aorta and coronary arteries. Central pulmonary vasculature is within normal limits. Mediastinum/Nodes: No new or enlarging lymph nodes within the chest.  Thyroid, trachea, and esophagus demonstrate no significant findings. No mediastinal hematoma. No pneumomediastinum. Lungs/Pleura: Small-moderate-sized right hemothorax, increased from prior. Minimal air within the right pleural space. Near-complete right lower lobe atelectasis/consolidation, significantly progressed. Trace left pleural effusion with new dependent opacities in the left lower lobe and lingula. Background of mild-to-moderate emphysema. Upper Abdomen: No new or acute findings within the imaged upper abdomen. Musculoskeletal: Redemonstration of multiple acute right-sided rib fractures including the right eighth through twelfth ribs as well as the right T11 transverse process. The degree of displacement of the right eighth rib fracture has increased since prior (series 2, image 107). Posterior paraspinal intramuscular hematoma centered at the posterior right eleventh rib fracture measures approximately 8.0 x 3.4 x 7.5 cm, similar in size to the previous study. No subcutaneous emphysema of the chest wall. IMPRESSION: 1. Small-moderate-sized right hemothorax, increased from prior study. Minimal air within the right pleural space. 2. Near-complete right lower lobe atelectasis/consolidation, significantly progressed since prior study. 3. Trace left pleural effusion with  new dependent opacities in the left lower lobe and lingula, which may represent atelectasis or developing pneumonia. 4. Redemonstration of multiple acute right-sided rib fractures including the right eighth through twelfth ribs as well as the right T11 transverse process. The degree of displacement of the right eighth rib fracture has increased since prior study. 5. Posterior paraspinal intramuscular hematoma centered at the posterior right eleventh rib fracture measures approximately 8.0 x 3.4 x 7.5 cm, similar in size to the previous study. 6. Aortic and coronary artery atherosclerosis (ICD10-I70.0). 7. 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 Recommend annual imaging followup by CTA or MRA. This recommendation follows 2010 ACCF/AHA/AATS/ACR/ASA/SCA/SCAI/SIR/STS/SVM Guidelines for the Diagnosis and Management of Patients with Thoracic Aortic Disease. Circulation.2010; [id number]. Aortic aneurysm NOS (ICD10-I71.9) Electronically Signed   By: Davina Poke D.O.   On: 06/21/2022 09:46   DG Chest Port 1 View  Result Date: 06/20/2022 CLINICAL DATA:  Right hemothorax. EXAM: PORTABLE CHEST 1 VIEW COMPARISON:  June 19, 2022. FINDINGS: The heart size and mediastinal contours are within normal limits. Mild bibasilar subsegmental atelectasis is noted. No definite pneumothorax or pleural effusion is noted. The visualized skeletal structures are unremarkable. IMPRESSION: Mild bibasilar subsegmental atelectasis. Electronically Signed   By: Marijo Conception M.D.   On: 06/20/2022 14:12    Cardiac Studies   Cardiac catheterization May 02, 2022 Patent LAD stents with stable mild in-stent restenosis of remotely placed mid-distal LAD stents, unchanged from completion of last catheterization.  Recently placed proximal LAD stent is widely patent. Severe LCx/OM2 disease, similar to prior catheterizations. Mildly elevated left ventricular filling pressure. Successful complex bifurcation PCI to LCx/OM2 using Culotte technique (Onyx frontier 2.75 x 30 mm main branch and 2.5 x 15 mm side branch drug-eluting stents) with 0% residual stenosis and TIMI-3 flow.  Patient Profile     Mr. Fitzsimmons is a 82 YO male with history of PCI in 2007 and NSTEMI in January 2024 status post PCI to the LAD and RCA followed by staged PCI to the left circumflex, bilateral carotid artery disease, dementia, type 2 diabetes, hypertension, prior tobacco use that is currently admitted after a mechanical fall leading to multiple traumatic posterior rib fractures, right-sided hemothorax, intramuscular hematoma and anemia.   Assessment & Plan    .1. Mechanical fall  with traumatic multiple rib fractures, right-sided hemothorax, and intramuscular hematoma - mechanical fall, slipping in the bathtub  ---multiple stents recently placed, on aspirin 81 mg daily -Given progressive right-sided hemothorax and symptomatic anemia that will be requiring PRBC transfusion, will continue to hold Plavix -Could consider restarting once hemothorax felt stable in size  2.  Multivessel CAD involving native coronary arteries without angina: -No symptoms concerning for angina or cardiac decompensation -Continue aspirin as outlined above with recommendation to resume clopidogrel possibly tomorrow if imaging stable   3.  Atrial tachycardia: Well-controlled on metoprolol   4.  HTN: Continue lisinopril, metoprolol   5.  History of TIA/CVA: -No new symptoms -Aspirin and atorvastatin    6.  Symptomatic anemia with progressive right hemothorax and intramuscular hematoma: -Baseline hemoglobin around 11-12 -Hemoglobin has trended to 6.7    Total encounter time more than 35 minutes  Greater than 50% was spent in counseling and coordination of care with the patient     For questions or updates, please contact [place] Please consult [email or web address] for contact info under        Signed, Ida Rogue, MD  06/22/2022, 1:26 PM

## 2022-06-22 NOTE — Progress Notes (Signed)
Occupational Therapy Treatment Patient Details Name: Raymond Sampson MRN: [id number] DOB: 1940-08-07 Today's Date: 06/22/2022   History of present illness Raymond Sampson is an 36yoM who comes to [facility] on 06/19/22 after a fall in shower at home, hit Rt flank, orthostatic upon arrival to ED 128/66 sitting to 94/60 standing. PMH: CAD, CVA, DM, HTN. Chest CT shows 'large intramuscular hematoma' with the Rt paraspinals. ABD CT revealing fracture of Rt ribs 8-12, (9-12 are displaced), fracture of T11 Rt transverse process, and 'small right hemothorax with adjacent atelectasis or contusion in the right lower lobe.'   OT comments  [name] was seen for OT treatment on this date. Upon arrival to room pt reclined in bed, family and RN at bedside, pt agreeable to tx. Pt noted to have sweat through sheets, assisted with bed change. Pt requires TOTAL A bed mobility. MAX A don/doff gown seated EOB. MAX A sit<>stand, does not achieve upright posture. Pt limited by pain, premedicated for session by RN. Pt making progress toward goals, will continue to follow POC. Discharge recommendation remains appropriate.     Recommendations for follow up therapy are one component of a multi-disciplinary discharge planning process, led by the attending physician.  Recommendations may be updated based on patient status, additional functional criteria and insurance authorization.    Follow Up Recommendations  Skilled nursing-short term rehab (<3 hours/day)     Assistance Recommended at Discharge Frequent or constant Supervision/Assistance  Patient can return home with the following  Two people to help with walking and/or transfers;Two people to help with bathing/dressing/bathroom;Help with stairs or ramp for entrance   Equipment Recommendations  Other (comment) (defer)    Recommendations for Other Services      Precautions / Restrictions Precautions Precautions: Fall Precaution Comments: rib fx Restrictions Weight  Bearing Restrictions: No       Mobility Bed Mobility Overal bed mobility: Needs Assistance Bed Mobility: Supine to Sit, Sit to Supine     Supine to sit: Total assist Sit to supine: Total assist        Transfers Overall transfer level: Needs assistance Equipment used: 1 person hand held assist Transfers: Sit to/from Stand, Bed to chair/wheelchair/BSC Sit to Stand: Max assist          Lateral/Scoot Transfers: Total assist General transfer comment: pt resistant to mobility citing pain     Balance Overall balance assessment: Needs assistance Sitting-balance support: Feet supported, Bilateral upper extremity supported Sitting balance-Leahy Scale: Fair     Standing balance support: Bilateral upper extremity supported, Reliant on assistive device for balance Standing balance-Leahy Scale: Poor                             ADL either performed or assessed with clinical judgement   ADL Overall ADL's : Needs assistance/impaired                                       General ADL Comments: MAX A don/doff gown seated EOB. MAX A don B socks bed level.      Cognition Arousal/Alertness: Awake/alert Behavior During Therapy: WFL for tasks assessed/performed Overall Cognitive Status: History of cognitive impairments - at baseline  General Comments SpO2 88% on 2L [place] with mobility    Pertinent Vitals/ Pain       Pain Assessment Pain Assessment: Faces Faces Pain Scale: Hurts whole lot Pain Location: R flank Pain Descriptors / Indicators: Discomfort, Dull, Grimacing, Aching, Constant Pain Intervention(s): Limited activity within patient's tolerance, Repositioned, Premedicated before session   Frequency  Min 2X/week        Progress Toward Goals  OT Goals(current goals can now be found in the care plan section)  Progress towards OT goals: Progressing toward goals  Acute Rehab  OT Goals Patient Stated Goal: to improve pain OT Goal Formulation: With patient Time For Goal Achievement: 07/04/22 Potential to Achieve Goals: Good ADL Goals Pt Will Perform Grooming: with modified independence;sitting Pt Will Perform Lower Body Dressing: sit to/from stand;with min assist Pt Will Transfer to Toilet: with min assist;stand pivot transfer;bedside commode  Plan Discharge plan remains appropriate;Frequency remains appropriate    Co-evaluation                 AM-PAC OT "6 Clicks" Daily Activity     Outcome Measure   Help from another person eating meals?: A Little Help from another person taking care of personal grooming?: A Little Help from another person toileting, which includes using toliet, bedpan, or urinal?: A Lot Help from another person bathing (including washing, rinsing, drying)?: A Lot Help from another person to put on and taking off regular upper body clothing?: A Little Help from another person to put on and taking off regular lower body clothing?: A Lot 6 Click Score: 15    End of Session    OT Visit Diagnosis: Other abnormalities of gait and mobility (R26.89);Muscle weakness (generalized) (M62.81)   Activity Tolerance Patient tolerated treatment well   Patient Left in bed;with call bell/phone within reach;with bed alarm set;with nursing/sitter in room;with family/visitor present   Nurse Communication          Time: [id number] OT Time Calculation (min): 24 min  Charges: OT General Charges $OT Visit: 1 Visit OT Treatments $Self Care/Home Management : 23-37 mins   Dessie Coma, M.S. OTR/L  06/22/22, 3:41 PM  ascom [phone]

## 2022-06-22 NOTE — Progress Notes (Signed)
Initial Nutrition Assessment  DOCUMENTATION CODES:   Non-severe (moderate) malnutrition in context of chronic illness  INTERVENTION:   -Ensure Enlive po TID, each supplement provides 350 kcal and 20 grams of protein -MVI with minerals daily -Liberalize diet to carb modified for wider variety of meal selections  NUTRITION DIAGNOSIS:   Moderate Malnutrition related to chronic illness (dementia) as evidenced by mild fat depletion, moderate fat depletion, mild muscle depletion, moderate muscle depletion.  GOAL:   Patient will meet greater than or equal to 90% of their needs  MONITOR:   PO intake, Supplement acceptance  REASON FOR ASSESSMENT:   Consult Assessment of nutrition requirement/status  ASSESSMENT:   Pt with medical history significant of coronary artery disease status post multiple stenting's with most recent stenting January 2024 x 2, carotid disease, CVA, type 2 diabetes, dementia, hyperlipidemia, hypertension, who presented to the hospital after a fall at home  Pt admitted with intramuscular hematoma and multiple rib fractures s/p fall.   3/20- s/p BSE- recommend regular diet with thin liquids   Reviewed I/O's: -1 L x 24 hours  UOP: 1.3 L x 24 hours  Per cardiology notes, CT scan reveals rt sided hemothorax. IR and surgery have been consulted for possible chest tube placement. Per general surgery notes, volume of blood is insufficient to place chest tube and surgical repair of ribs is likely not beneficial.   Pt sitting up in bed at time of visit. Pt reports feeling better today. Pt ate "some breakfast" but unsure what he had. Pt was unable to answer questions that required more than 1-2 word answers and was often delayed in responses. No family present to provide additional history.   Pt currently on a heart healthy, carb modified diet. No meal completion data available to assess at this time.   Reviewed wt hx; pt has experienced a 3.6% wt loss over the past  year, which is not significant for time frame.   Medications reviewed and include zofran.   Lab Results  Component Value Date   HGBA1C 6.8 (H) 04/12/2022   PTA DM medications are 25 mg sitagliptin daily and 1000 mg metformin BID.   Labs reviewed: Na: 134, CBGS: 155-167 (inpatient orders for glycemic control are 0-5 units insulin aspart daily at bedtime and 0-9 units insulin aspart TID with meals).    NUTRITION - FOCUSED PHYSICAL EXAM:  Flowsheet Row Most Recent Value  Orbital Region Mild depletion  Upper Arm Region Moderate depletion  Thoracic and Lumbar Region Mild depletion  Buccal Region Moderate depletion  Temple Region Moderate depletion  Clavicle Bone Region Mild depletion  Clavicle and Acromion Bone Region Mild depletion  Scapular Bone Region Mild depletion  Dorsal Hand Moderate depletion  Patellar Region Moderate depletion  Anterior Thigh Region Moderate depletion  Posterior Calf Region Moderate depletion  Edema (RD Assessment) None  Hair Reviewed  Eyes Reviewed  Mouth Reviewed  Skin Reviewed  Nails Reviewed       Diet Order:   Diet Order             Diet Carb Modified Fluid consistency: Thin  Diet effective now                   EDUCATION NEEDS:   No education needs have been identified at this time  Skin:  Skin Assessment: Reviewed RN Assessment  Last BM:  06/22/22 (type 4)  Height:   Ht Readings from Last 1 Encounters:  06/19/22 5\' 7"  (1.702 m)  Weight:   Wt Readings from Last 1 Encounters:  06/19/22 69.6 kg    Ideal Body Weight:  67.3 kg  BMI:  Body mass index is 24.03 kg/m.  Estimated Nutritional Needs:   Kcal:  2050-2250  Protein:  105-120 grams  Fluid:  > 2 L    Loistine Chance, RD, LDN, [place] Registered Dietitian II Certified Diabetes Care and Education Specialist Please refer to [facility] for RD and/or RD on-call/weekend/after hours pager

## 2022-06-22 NOTE — Progress Notes (Signed)
Progress Note    Raymond Sampson  [id number] DOB: March 22, 1941  DOA: 06/19/2022 PCP: [name], MD      Brief Narrative:    Medical records reviewed and are as summarized below:  Raymond Sampson is a 82 y.o. male with medical history significant of coronary artery disease status post multiple stenting's with most recent stenting January 2024 x 2, carotid disease, CVA, type 2 diabetes, dementia, hyperlipidemia, hypertension, who presented to the hospital after a fall at home.  Reportedly, he slipped on soap in the tub and landed on his right side.  Workup revealed multiple right rib fractures (8 to 12), intramuscular hematoma within the right paraspinal and posterior flank musculature and small right hemothorax.      Assessment/Plan:   Principal Problem:   Intramuscular hematoma Active Problems:   Rib fractures   Status post coronary artery stent placement   Hyperlipidemia   Essential hypertension   Diabetes mellitus type 2, controlled, with complications ([place])   Hemothorax on right   Leukocytosis   Multiple rib fractures involving four or more ribs, right side   Acute blood loss anemia    Body mass index is 24.03 kg/m.   Multiple right rib fractures (8th through 12th), right T11 transverse process fracture, intramuscular hematoma within the right paraspinal and posterior flank musculature, and  right hemothorax, s/p fall: CT chest without contrast (done on 06/21/2022) showed small to moderate size right pneumothorax (increased from prior study), near complete right lower lobe atelectasis/consolidation new dependent opacities in the left lower lobe and lingula which may present atelectasis or developing pneumonia.  Clinically, doubt pneumonia at this time.  He was evaluated by Dr. Sima Matas, general surgeon.  Conservative management recommended at this time. Analgesics as needed for pain.  Follow-up with general surgeon.   Acute blood loss anemia:  Hemoglobin was 9.8 on admission.  Today's hemoglobin is 8.  S/p transfusion with 1 unit of packed red blood cells on 06/21/2022.  Monitor H&H and transfuse as needed.     Acute hypoxic respiratory failure: He is on 3 L/min oxygen via [place].  Wean off oxygen as able.     CAD s/p coronary stent in January 2024, history of stroke: Continue aspirin.  Plavix has been held because of hemothorax.  Will defer decision to restart Plavix to cardiologist.   Type II DM with hyperglycemia: NovoLog as needed.  Metformin and Januvia have been held.  Dementia: Continue supportive care   Diet Order             Diet heart healthy/carb modified Room service appropriate? Yes with Assist; Fluid consistency: Thin  Diet effective now                            Consultants: Cardiologist General surgeon  Procedures: None    Medications:    aspirin EC  81 mg Oral Daily   atorvastatin  80 mg Oral Daily   insulin aspart  0-5 Units Subcutaneous QHS   insulin aspart  0-9 Units Subcutaneous TID WC   lisinopril  20 mg Oral Daily   metoprolol tartrate  25 mg Oral BID   ondansetron (ZOFRAN) IV  4 mg Intravenous Once   pantoprazole  20 mg Oral Daily   sertraline  25 mg Oral Daily   Continuous Infusions:  sodium chloride 50 mL/hr at 06/20/22 1056     Anti-infectives (From admission, onward)  None              Family Communication/Anticipated D/C date and plan/Code Status   DVT prophylaxis: SCDs Start: 06/19/22 1719     Code Status: Full Code  Family Communication: Plan discussed with his wife at the bedside Disposition Plan: Plan to discharge home in 2 to 3 days   Status is: Inpatient Remains inpatient appropriate because: Hemothorax, acute blood loss anemia         Subjective:   Interval events noted.  He only has pain on the right side of the chest wall when he moves.  No other complaints.  No shortness of breath.  His wife is at the  bedside.  Objective:    Vitals:   06/21/22 1720 06/21/22 2118 06/21/22 2123 06/22/22 0743  BP: (!) 137/59 (!) 157/66 (!) 157/66 (!) 152/64  Pulse: 97 83 90 92  Resp: 16   16  Temp: 97.9 F (36.6 C)  98.6 F (37 C) 97.7 F (36.5 C)  TempSrc: Axillary  Oral Oral  SpO2: 96%  94% 91%  Weight:      Height:       No data found.   Intake/Output Summary (Last 24 hours) at 06/22/2022 1124 Last data filed at 06/21/2022 2123 Gross per 24 hour  Intake 300 ml  Output 400 ml  Net -100 ml   Filed Weights   06/19/22 2124  Weight: 69.6 kg    Exam:   GEN: NAD SKIN: Bruises on the right side of the back EYES: EOMI ENT: MMM CV: RRR PULM: CTA B ABD: soft, ND, NT, +BS CNS: AAO x 3, non focal EXT: No edema or tenderness MSK: Right lateral chest wall tenderness     Data Reviewed:   I have personally reviewed following labs and imaging studies:  Labs: Labs show the following:   Basic Metabolic Panel: Recent Labs  Lab 06/19/22 1145 06/20/22 0425  NA 135 134*  K 4.9 4.3  CL 97* 98  CO2 25 24  GLUCOSE 231* 171*  BUN 21 25*  CREATININE 1.29* 1.17  CALCIUM 8.8* 8.7*   GFR Estimated Creatinine Clearance: 46.3 mL/min (by C-G formula based on SCr of 1.17 mg/dL). Liver Function Tests: Recent Labs  Lab 06/19/22 1145 06/20/22 0425  AST 39 31  ALT 20 21  ALKPHOS 73 62  BILITOT 0.7 0.9  PROT 6.7 6.5  ALBUMIN 3.4* 3.4*   No results for input(s): "LIPASE", "AMYLASE" in the last 168 hours. No results for input(s): "AMMONIA" in the last 168 hours. Coagulation profile No results for input(s): "INR", "PROTIME" in the last 168 hours.  CBC: Recent Labs  Lab 06/19/22 1145 06/19/22 1925 06/20/22 0425 06/20/22 1424 06/21/22 0510 06/21/22 2148 06/22/22 0704  WBC 15.7*  --   --   --  10.5 9.7 10.7*  NEUTROABS  --   --   --   --  8.1*  --   --   HGB 9.8*   < > 8.8* 7.5* 6.7* 7.9* 8.0*  HCT 30.9*   < > 27.3* 23.2* 20.7* 23.7* 24.5*  MCV 95.4  --   --   --  93.2 91.9  92.1  PLT 317  --   --   --  235 226 232   < > = values in this interval not displayed.   Cardiac Enzymes: No results for input(s): "CKTOTAL", "CKMB", "CKMBINDEX", "TROPONINI" in the last 168 hours. BNP (last 3 results) No results for input(s): "PROBNP" in the  last 8760 hours. CBG: Recent Labs  Lab 06/20/22 2115 06/21/22 1039 06/21/22 1634 06/21/22 2016 06/22/22 0746  GLUCAP 187* 169* 178* 149* 155*   D-Dimer: No results for input(s): "DDIMER" in the last 72 hours. Hgb A1c: No results for input(s): "HGBA1C" in the last 72 hours. Lipid Profile: No results for input(s): "CHOL", "HDL", "LDLCALC", "TRIG", "CHOLHDL", "LDLDIRECT" in the last 72 hours. Thyroid function studies: No results for input(s): "TSH", "T4TOTAL", "T3FREE", "THYROIDAB" in the last 72 hours.  Invalid input(s): "FREET3" Anemia work up: No results for input(s): "VITAMINB12", "FOLATE", "FERRITIN", "TIBC", "IRON", "RETICCTPCT" in the last 72 hours. Sepsis Labs: Recent Labs  Lab 06/19/22 1145 06/21/22 0510 06/21/22 2148 06/22/22 0704  WBC 15.7* 10.5 9.7 10.7*    Microbiology No results found for this or any previous visit (from the past 240 hour(s)).  Procedures and diagnostic studies:  DG CHEST PORT 1 VIEW  Result Date: 06/22/2022 CLINICAL DATA:  Pneumothorax EXAM: PORTABLE CHEST 1 VIEW COMPARISON:  06/20/2022. FINDINGS: Right base opacity suggests layering effusion. Lungs are otherwise clear. No pneumothorax identified. Normal pulmonary vasculature. Normal cardiac silhouette. Fractures identified of the right posterior tenth and eleventh ribs. Additional fractures described on CT are not evident on plain film. IMPRESSION: Right base consolidation or volume loss and effusion. Electronically Signed   By: Sammie Bench M.D.   On: 06/22/2022 08:54   CT HEAD WO CONTRAST (5MM)  Result Date: 06/21/2022 CLINICAL DATA:  Fall, bleeding EXAM: CT HEAD WITHOUT CONTRAST TECHNIQUE: Contiguous axial images were  obtained from the base of the skull through the vertex without intravenous contrast. RADIATION DOSE REDUCTION: This exam was performed according to the departmental dose-optimization program which includes automated exposure control, adjustment of the mA and/or kV according to patient size and/or use of iterative reconstruction technique. COMPARISON:  06/05/2017 FINDINGS: Brain: No evidence of acute infarction, hemorrhage, mass, mass effect, or midline shift. No hydrocephalus or extra-axial fluid collection. Redemonstrated encephalomalacia in the left parieto-occipital region. Left posterior fossa arachnoid cyst. Periventricular white matter changes, likely the sequela of chronic small vessel ischemic disease. Vascular: No hyperdense vessel. Atherosclerotic calcifications in the intracranial carotid and vertebral arteries. Skull: Negative for fracture or focal lesion. Sinuses/Orbits: Mucosal thickening in the maxillary sinuses. No acute finding in the orbits. Other: The mastoid air cells are well aerated. IMPRESSION: No acute intracranial process. Electronically Signed   By: Merilyn Baba M.D.   On: 06/21/2022 14:34   CT CHEST WO CONTRAST  Result Date: 06/21/2022 CLINICAL DATA:  Right hemothorax, anemia EXAM: CT CHEST WITHOUT CONTRAST TECHNIQUE: Multidetector CT imaging of the chest was performed following the standard protocol without IV contrast. RADIATION DOSE REDUCTION: This exam was performed according to the departmental dose-optimization program which includes automated exposure control, adjustment of the mA and/or kV according to patient size and/or use of iterative reconstruction technique. COMPARISON:  CT 06/19/2022 FINDINGS: Cardiovascular: Heart size is normal. No pericardial effusion. Relative hypoattenuation of the cardiac blood pool indicative of anemia. Thoracic aortic arch measures 3.8 cm in diameter. Atherosclerotic calcification of the aorta and coronary arteries. Central pulmonary vasculature  is within normal limits. Mediastinum/Nodes: No new or enlarging lymph nodes within the chest. Thyroid, trachea, and esophagus demonstrate no significant findings. No mediastinal hematoma. No pneumomediastinum. Lungs/Pleura: Small-moderate-sized right hemothorax, increased from prior. Minimal air within the right pleural space. Near-complete right lower lobe atelectasis/consolidation, significantly progressed. Trace left pleural effusion with new dependent opacities in the left lower lobe and lingula. Background of mild-to-moderate emphysema. Upper Abdomen: No  new or acute findings within the imaged upper abdomen. Musculoskeletal: Redemonstration of multiple acute right-sided rib fractures including the right eighth through twelfth ribs as well as the right T11 transverse process. The degree of displacement of the right eighth rib fracture has increased since prior (series 2, image 107). Posterior paraspinal intramuscular hematoma centered at the posterior right eleventh rib fracture measures approximately 8.0 x 3.4 x 7.5 cm, similar in size to the previous study. No subcutaneous emphysema of the chest wall. IMPRESSION: 1. Small-moderate-sized right hemothorax, increased from prior study. Minimal air within the right pleural space. 2. Near-complete right lower lobe atelectasis/consolidation, significantly progressed since prior study. 3. Trace left pleural effusion with new dependent opacities in the left lower lobe and lingula, which may represent atelectasis or developing pneumonia. 4. Redemonstration of multiple acute right-sided rib fractures including the right eighth through twelfth ribs as well as the right T11 transverse process. The degree of displacement of the right eighth rib fracture has increased since prior study. 5. Posterior paraspinal intramuscular hematoma centered at the posterior right eleventh rib fracture measures approximately 8.0 x 3.4 x 7.5 cm, similar in size to the previous study. 6.  Aortic and coronary artery atherosclerosis (ICD10-I70.0). 7. Thoracic aortic arch measures 3.8 cm in diameter. Recommend annual imaging followup by CTA or MRA. This recommendation follows 2010 ACCF/AHA/AATS/ACR/ASA/SCA/SCAI/SIR/STS/SVM Guidelines for the Diagnosis and Management of Patients with Thoracic Aortic Disease. Circulation.2010; [id number]. Aortic aneurysm NOS (ICD10-I71.9) Electronically Signed   By: Davina Poke D.O.   On: 06/21/2022 09:46   DG Chest Port 1 View  Result Date: 06/20/2022 CLINICAL DATA:  Right hemothorax. EXAM: PORTABLE CHEST 1 VIEW COMPARISON:  June 19, 2022. FINDINGS: The heart size and mediastinal contours are within normal limits. Mild bibasilar subsegmental atelectasis is noted. No definite pneumothorax or pleural effusion is noted. The visualized skeletal structures are unremarkable. IMPRESSION: Mild bibasilar subsegmental atelectasis. Electronically Signed   By: Marijo Conception M.D.   On: 06/20/2022 14:12               LOS: 2 days   Laikyn Gewirtz  [facility]   Pager on [email or web address]. If 7PM-7AM, please contact night-coverage at [email or web address]     06/22/2022, 11:24 AM

## 2022-06-23 ENCOUNTER — Other Ambulatory Visit: Payer: Self-pay

## 2022-06-23 ENCOUNTER — Inpatient Hospital Stay: Payer: PPO

## 2022-06-23 DIAGNOSIS — I25118 Atherosclerotic heart disease of native coronary artery with other forms of angina pectoris: Secondary | ICD-10-CM | POA: Diagnosis not present

## 2022-06-23 DIAGNOSIS — I10 Essential (primary) hypertension: Secondary | ICD-10-CM

## 2022-06-23 DIAGNOSIS — W19XXXA Unspecified fall, initial encounter: Secondary | ICD-10-CM | POA: Diagnosis not present

## 2022-06-23 DIAGNOSIS — J942 Hemothorax: Secondary | ICD-10-CM

## 2022-06-23 DIAGNOSIS — T148XXA Other injury of unspecified body region, initial encounter: Secondary | ICD-10-CM | POA: Diagnosis not present

## 2022-06-23 DIAGNOSIS — S2241XA Multiple fractures of ribs, right side, initial encounter for closed fracture: Secondary | ICD-10-CM | POA: Diagnosis not present

## 2022-06-23 LAB — BASIC METABOLIC PANEL
Anion gap: 8 (ref 5–15)
BUN: 38 mg/dL — ABNORMAL HIGH (ref 8–23)
CO2: 24 mmol/L (ref 22–32)
Calcium: 8.1 mg/dL — ABNORMAL LOW (ref 8.9–10.3)
Chloride: 104 mmol/L (ref 98–111)
Creatinine, Ser: 1.02 mg/dL (ref 0.61–1.24)
GFR, Estimated: >60 mL/min (ref >60)
Glucose, Bld: 182 mg/dL — ABNORMAL HIGH (ref 70–99)
Potassium: 4 mmol/L (ref 3.5–5.1)
Sodium: 136 mmol/L (ref 135–145)

## 2022-06-23 LAB — GLUCOSE, CAPILLARY
Glucose-Capillary: 158 mg/dL — ABNORMAL HIGH (ref 70–99)
Glucose-Capillary: 203 mg/dL — ABNORMAL HIGH (ref 70–99)
Glucose-Capillary: 152 mg/dL — ABNORMAL HIGH (ref 70–99)
Glucose-Capillary: 224 mg/dL — ABNORMAL HIGH (ref 70–99)

## 2022-06-23 LAB — CBC
HCT: 22.9 % — ABNORMAL LOW (ref 39.0–52.0)
Hemoglobin: 7.4 g/dL — ABNORMAL LOW (ref 13.0–17.0)
MCH: 30.1 pg (ref 26.0–34.0)
MCHC: 32.3 g/dL (ref 30.0–36.0)
MCV: 93.1 fL (ref 80.0–100.0)
Platelets: 256 10*3/uL (ref 150–400)
RBC: 2.46 MIL/uL — ABNORMAL LOW (ref 4.22–5.81)
RDW: 14 % (ref 11.5–15.5)
WBC: 14.5 10*3/uL — ABNORMAL HIGH (ref 4.0–10.5)
nRBC: 0 % (ref 0.0–0.2)

## 2022-06-23 LAB — TROPONIN I (HIGH SENSITIVITY)
Troponin I (High Sensitivity): 2653 ng/L (ref <18)
Troponin I (High Sensitivity): 3010 ng/L (ref <18)
Troponin I (High Sensitivity): 3781 ng/L (ref <18)

## 2022-06-23 LAB — MAGNESIUM: Magnesium: 2.3 mg/dL (ref 1.7–2.4)

## 2022-06-23 LAB — PREPARE RBC (CROSSMATCH)

## 2022-06-23 MED ORDER — NITROGLYCERIN 0.4 MG SL SUBL: 0.4000 mg | SUBLINGUAL_TABLET | SUBLINGUAL | Status: DC | PRN

## 2022-06-23 MED ORDER — SODIUM CHLORIDE 0.9% IV SOLUTION: Freq: Once | INTRAVENOUS | Status: AC

## 2022-06-23 MED ORDER — CLOPIDOGREL BISULFATE 75 MG PO TABS
75.0000 mg | ORAL_TABLET | Freq: Every day | ORAL | Status: DC
  Administered 2022-06-23 – 2022-07-03 (×11): 75 mg via ORAL
  Filled 2022-06-23 (×11): qty 1

## 2022-06-23 MED ORDER — ALUM & MAG HYDROXIDE-SIMETH 200-200-20 MG/5ML PO SUSP
30.0000 mL | ORAL | Status: DC | PRN
  Administered 2022-06-23: 30 mL via ORAL
  Filled 2022-06-23: qty 30

## 2022-06-23 NOTE — Progress Notes (Addendum)
[facility] SURGICAL PROGRESS NOTE    Interval History: Patient seen and examined, no acute events or new complaints overnight. Patient is resting and somnolent.  His wife reports some productive cough, but no distress or increased oxygen needs.  Pain seems controlled but minimal pt mobility.   Review of Systems:  Constitutional: denies fever,  reports sweats and somechills  Respiratory: denies any worsening of his shortness of breath  Cardiovascular: denies palpitations  Gastrointestinal: denies abdominal pain, N/V, Musculoskeletal: denies pain Integumentary: denies any other rashes or skin discolorations except the extensive right flan ecchymosis.   Vital signs in last 24 hours: [min-max] current  Temp:  [97.5 F (36.4 C)-98.7 F (37.1 C)] 97.5 F (36.4 C) (03/22 0458) Pulse Rate:  [78-92] 78 (03/22 0458) Resp:  [16-20] 18 (03/22 0458) BP: (123-152)/(61-64) 123/61 (03/22 0458) SpO2:  [90 %-94 %] 93 % (03/22 0458)     Height: 5\' 7"  (170.2 cm) Weight: 69.6 kg BMI (Calculated): 24.03   Intake/Output last 2 shifts:  03/21 0701 - 03/22 0700 In: -  Out: 800 [Urine:800]   Physical Exam:  Constitutional: arousable, cooperative and no distress  Respiratory: breathing non-labored at rest  Cardiovascular: regular rate and sinus rhythm  Gastrointestinal: soft, non-tender, and non-distended Integumentary: intact  Labs:     Latest Ref Rng & Units 06/22/2022    3:25 PM 06/22/2022    7:04 AM 06/21/2022    9:48 PM  CBC  WBC 4.0 - 10.5 K/uL 13.8  10.7  9.7   Hemoglobin 13.0 - 17.0 g/dL 8.3  8.0  7.9   Hematocrit 39.0 - 52.0 % 25.0  24.5  23.7   Platelets 150 - 400 K/uL 269  232  226       Latest Ref Rng & Units 06/22/2022    3:25 PM 06/20/2022    4:25 AM 06/19/2022   11:45 AM  CMP  Glucose 70 - 99 mg/dL 219  171  231   BUN 8 - 23 mg/dL 31  25  21    Creatinine 0.61 - 1.24 mg/dL 1.01  1.17  1.29   Sodium 135 - 145 mmol/L 133  134  135   Potassium 3.5 - 5.1 mmol/L  4.1  4.3  4.9   Chloride 98 - 111 mmol/L 104  98  97   CO2 22 - 32 mmol/L 23  24  25    Calcium 8.9 - 10.3 mg/dL 8.1  8.7  8.8   Total Protein 6.5 - 8.1 g/dL  6.5  6.7   Total Bilirubin 0.3 - 1.2 mg/dL  0.9  0.7   Alkaline Phos 38 - 126 U/L  62  73   AST 15 - 41 U/L  31  39   ALT 0 - 44 U/L  21  20      Imaging studies:  CLINICAL DATA:  Pneumothorax   EXAM: PORTABLE CHEST 1 VIEW   COMPARISON:  06/20/2022.   FINDINGS: Right base opacity suggests layering effusion. Lungs are otherwise clear. No pneumothorax identified. Normal pulmonary vasculature. Normal cardiac silhouette. Fractures identified of the right posterior tenth and eleventh ribs. Additional fractures described on CT are not evident on plain film.   IMPRESSION: Right base consolidation or volume loss and effusion.     Electronically Signed   By: Sammie Bench M.D.   On: 06/22/2022 08:54  Assessment/Plan:  82 y.o. male with multiple right rib fxs and small hemothorax   s/p fall, complicated by pertinent comorbidities including:  Patient Active Problem List   Diagnosis Date Noted   Fall 06/22/2022   Malnutrition of moderate degree 06/22/2022   Acute blood loss anemia 06/21/2022   Multiple rib fractures involving four or more ribs, right side 06/20/2022   Intramuscular hematoma 06/19/2022   Rib fractures 06/19/2022   Hemothorax on right 06/19/2022   Leukocytosis 06/19/2022   Status post coronary artery stent placement 05/02/2022   Mild dementia ([place]) 04/14/2022   Non-ST elevation (NSTEMI) myocardial infarction ([place]) 04/13/2022   History of COVID-19 04/13/2022   Lung nodule 04/13/2022   PAD (peripheral artery disease) ([place]) 04/13/2022   Flu 06/05/2017   Diabetes mellitus type 2, controlled, with complications ([place]) 0000000   Carotid stenosis 05/14/2013   CAROTID BRUIT, LEFT 04/25/2010   PALPITATIONS 06/18/2009   Hyperlipidemia 04/27/2009   Essential hypertension 04/27/2009   Coronary artery  disease of native artery of native heart with stable angina pectoris ([place]) 04/27/2009    - repeat CXR 3/22    - continue pulmonary toilet.    - doubt percutaneous drain for moderate hemothorax will be significantly beneficial at this time.    - will follow with you.    All of the above findings and recommendations were discussed with the patient, and all of patient's questions were answered to their expressed satisfaction.

## 2022-06-23 NOTE — Progress Notes (Signed)
PROGRESS NOTE    Raymond Sampson  [id number] DOB: 1940-07-10 DOA: 06/19/2022 PCP: [name], MD    Brief Narrative:  Raymond Sampson is a 82 y.o. male with medical history significant of coronary artery disease status post multiple stenting's with most recent stenting January 2024 x 2, carotid disease, CVA, type 2 diabetes, dementia, hyperlipidemia, hypertension, who presented to the hospital after a fall at home.  Reportedly, he slipped on soap in the tub and landed on his right side.   Workup revealed multiple right rib fractures (8 to 12), intramuscular hematoma within the right paraspinal and posterior flank musculature and small right hemothorax.  Plavix was held given 9 acute bleed.  General surgery consulted.  No surgical intervention indicated for hemothorax.  Stable appearance on imaging.  Cardiology to restart Plavix 3/22.   Assessment & Plan:   Principal Problem:   Intramuscular hematoma Active Problems:   Rib fractures   Status post coronary artery stent placement   Hyperlipidemia   Essential hypertension   Coronary artery disease of native artery of native heart with stable angina pectoris (HCC)   Diabetes mellitus type 2, controlled, with complications (HCC)   Hemothorax on right   Leukocytosis   Multiple rib fractures involving four or more ribs, right side   Acute blood loss anemia   Fall   Malnutrition of moderate degree  Multiple right rib fractures (8th through 12th), right T11 transverse process fracture, intramuscular hematoma within the right paraspinal and posterior flank musculature, and  right hemothorax, s/p fall: CT chest without contrast (done on 06/21/2022) showed small to moderate size right pneumothorax (increased from prior study), near complete right lower lobe atelectasis/consolidation new dependent opacities in the left lower lobe and lingula which may present atelectasis or developing pneumonia.  Clinically, doubt pneumonia at this  time.  He was evaluated by Dr. Sima Matas, general surgeon.  Conservative management recommended at this time.  General surgery has signed off as of 3/22 As needed pain control.  Stress incentive spirometry use.  Mobility as tolerated.     Acute blood loss anemia: Hemoglobin was 9.8 on admission.  Has dropped to 7.4.  Patient remains hemodynamically stable.  Stable appearance of hemothorax.  Cardiology to restart Plavix 3/22.  Will transfuse 1 unit PRBC.  Monitor daily hemoglobin.  Monitor closely for any bleeding.  Transfuse as needed less than 8.     Acute hypoxic respiratory failure: He is on 3 L/min oxygen via [place].  Wean off oxygen as able.  Stress incentive spirometry use.  Transfusion as above.     CAD s/p coronary stent in January 2024, history of stroke: Continue aspirin.  Plavix has been held because of hemothorax.  Plavix to be restarted 3/22.  Monitor closely for bleeding.     Type II DM with hyperglycemia: NovoLog as needed.  Metformin and Januvia have been held.   Dementia: Continue supportive care  DVT prophylaxis: SCD Code Status: Full Family Communication: Family member at bedside 3/22 Disposition Plan: Status is: Inpatient Remains inpatient appropriate because: Acute respiratory failure.  Acute blood loss anemia.  Multiple rib fractures requiring pain control.   Level of care: Med-Surg  Consultants:  Cardiology General surgery, signed off  Procedures:  None  Antimicrobials: None   Subjective: Seen and examined.  Patient sleepy/lethargic.  Family member at bedside.  Endorses concerns on seated position while eating.  Objective: Vitals:   06/22/22 1717 06/22/22 2026 06/23/22 0458 06/23/22 0730  BP: 131/62 (!) 149/61  123/61 (!) 127/53  Pulse: 83 86 78 75  Resp: 18 20 18 18   Temp: 98 F (36.7 C) 97.6 F (36.4 C) (!) 97.5 F (36.4 C) (!) 97.5 F (36.4 C)  TempSrc:  Oral Oral Axillary  SpO2: 94% 90% 93% 96%  Weight:      Height:        Intake/Output  Summary (Last 24 hours) at 06/23/2022 1218 Last data filed at 06/23/2022 0744 Gross per 24 hour  Intake --  Output 975 ml  Net -975 ml   Filed Weights   06/19/22 2124  Weight: 69.6 kg    Examination:  General exam: Appears lethargic.  Appears chronically ill Respiratory system: Poor respiratory effort.  Lungs overall clear.  3 L Cardiovascular system: S1-S2, RRR, no murmurs, no pedal edema Gastrointestinal system: Soft,/ND, normal bowel sounds Central nervous system: Lethargic and unable to assess orientation.  No focal deficits Extremities: Unable to assess power Skin: No rashes, lesions or ulcers Psychiatry: Judgement and insight appear impaired. Mood & affect flattened.     Data Reviewed: I have personally reviewed following labs and imaging studies  CBC: Recent Labs  Lab 06/21/22 0510 06/21/22 2148 06/22/22 0704 06/22/22 1525 06/23/22 0719  WBC 10.5 9.7 10.7* 13.8* 14.5*  NEUTROABS 8.1*  --   --  11.8*  --   HGB 6.7* 7.9* 8.0* 8.3* 7.4*  HCT 20.7* 23.7* 24.5* 25.0* 22.9*  MCV 93.2 91.9 92.1 91.9 93.1  PLT 235 226 232 269 [id number]   Basic Metabolic Panel: Recent Labs  Lab 06/19/22 1145 06/20/22 0425 06/22/22 1525  NA 135 134* 133*  K 4.9 4.3 4.1  CL 97* 98 104  CO2 25 24 23   GLUCOSE 231* 171* 219*  BUN 21 25* 31*  CREATININE 1.29* 1.17 1.01  CALCIUM 8.8* 8.7* 8.1*   GFR: Estimated Creatinine Clearance: 53.6 mL/min (by C-G formula based on SCr of 1.01 mg/dL). Liver Function Tests: Recent Labs  Lab 06/19/22 1145 06/20/22 0425  AST 39 31  ALT 20 21  ALKPHOS 73 62  BILITOT 0.7 0.9  PROT 6.7 6.5  ALBUMIN 3.4* 3.4*   No results for input(s): "LIPASE", "AMYLASE" in the last 168 hours. No results for input(s): "AMMONIA" in the last 168 hours. Coagulation Profile: No results for input(s): "INR", "PROTIME" in the last 168 hours. Cardiac Enzymes: No results for input(s): "CKTOTAL", "CKMB", "CKMBINDEX", "TROPONINI" in the last 168 hours. BNP (last 3  results) No results for input(s): "PROBNP" in the last 8760 hours. HbA1C: No results for input(s): "HGBA1C" in the last 72 hours. CBG: Recent Labs  Lab 06/22/22 1232 06/22/22 1747 06/22/22 2027 06/23/22 0738 06/23/22 1202  GLUCAP 167* 197* 151* 158* 203*   Lipid Profile: No results for input(s): "CHOL", "HDL", "LDLCALC", "TRIG", "CHOLHDL", "LDLDIRECT" in the last 72 hours. Thyroid Function Tests: No results for input(s): "TSH", "T4TOTAL", "FREET4", "T3FREE", "THYROIDAB" in the last 72 hours. Anemia Panel: No results for input(s): "VITAMINB12", "FOLATE", "FERRITIN", "TIBC", "IRON", "RETICCTPCT" in the last 72 hours. Sepsis Labs: No results for input(s): "PROCALCITON", "LATICACIDVEN" in the last 168 hours.  No results found for this or any previous visit (from the past 240 hour(s)).       Radiology Studies: DG Chest Port 1 View  Result Date: 06/23/2022 CLINICAL DATA:  Provided history: Atelectasis of right lung. EXAM: PORTABLE CHEST 1 VIEW COMPARISON:  Prior chest radiographs 06/22/2022 and earlier. Chest CT 06/21/2022. FINDINGS: The cardiomediastinal silhouette is unchanged. Aortic atherosclerosis. Persistent hazy opacity at the right  lung base which may reflect pleural fluid, atelectasis, pulmonary contusion and/or airspace consolidation. No appreciable airspace consolidation on the left. Persistent trace left pleural effusion. No evidence of pneumothorax. Known acute rib fractures, as described on the recent prior chest CT of 06/21/2022. IMPRESSION: 1. Persistent hazy opacity at the right lung base, which may reflect layering pleural fluid, atelectasis, pulmonary contusion and/or airspace consolidation. 2. Persistent trace left pleural effusion. 3. Aortic Atherosclerosis (ICD10-I70.0). Electronically Signed   By: Kellie Simmering D.O.   On: 06/23/2022 09:36   DG CHEST PORT 1 VIEW  Result Date: 06/22/2022 CLINICAL DATA:  Pneumothorax EXAM: PORTABLE CHEST 1 VIEW COMPARISON:   06/20/2022. FINDINGS: Right base opacity suggests layering effusion. Lungs are otherwise clear. No pneumothorax identified. Normal pulmonary vasculature. Normal cardiac silhouette. Fractures identified of the right posterior tenth and eleventh ribs. Additional fractures described on CT are not evident on plain film. IMPRESSION: Right base consolidation or volume loss and effusion. Electronically Signed   By: Sammie Bench M.D.   On: 06/22/2022 08:54   CT HEAD WO CONTRAST (5MM)  Result Date: 06/21/2022 CLINICAL DATA:  Fall, bleeding EXAM: CT HEAD WITHOUT CONTRAST TECHNIQUE: Contiguous axial images were obtained from the base of the skull through the vertex without intravenous contrast. RADIATION DOSE REDUCTION: This exam was performed according to the departmental dose-optimization program which includes automated exposure control, adjustment of the mA and/or kV according to patient size and/or use of iterative reconstruction technique. COMPARISON:  06/05/2017 FINDINGS: Brain: No evidence of acute infarction, hemorrhage, mass, mass effect, or midline shift. No hydrocephalus or extra-axial fluid collection. Redemonstrated encephalomalacia in the left parieto-occipital region. Left posterior fossa arachnoid cyst. Periventricular white matter changes, likely the sequela of chronic small vessel ischemic disease. Vascular: No hyperdense vessel. Atherosclerotic calcifications in the intracranial carotid and vertebral arteries. Skull: Negative for fracture or focal lesion. Sinuses/Orbits: Mucosal thickening in the maxillary sinuses. No acute finding in the orbits. Other: The mastoid air cells are well aerated. IMPRESSION: No acute intracranial process. Electronically Signed   By: Merilyn Baba M.D.   On: 06/21/2022 14:34        Scheduled Meds:  aspirin EC  81 mg Oral Daily   atorvastatin  80 mg Oral Daily   feeding supplement  237 mL Oral TID BM   insulin aspart  0-5 Units Subcutaneous QHS   insulin aspart   0-9 Units Subcutaneous TID WC   lisinopril  20 mg Oral Daily   metoprolol tartrate  25 mg Oral BID   multivitamin with minerals  1 tablet Oral Daily   ondansetron (ZOFRAN) IV  4 mg Intravenous Once   pantoprazole  20 mg Oral Daily   sertraline  25 mg Oral Daily   Continuous Infusions:   LOS: 3 days     Sidney Ace, MD [facility]   If 7PM-7AM, please contact night-coverage  06/23/2022, 12:18 PM

## 2022-06-23 NOTE — Progress Notes (Signed)
[facility] Hospital Day(s): 3.   Post op day(s):  Mar[facility] Kitchen   Interval History: Patient seen and examined, no acute events or new complaints overnight. Patient is resting and somnolent.  No respiratory distress or increased oxygen needs.  Pain seems controlled but minimal pt mobility.   Review of Systems:  Constitutional: denies fever,  reports sweats and somechills  Respiratory: denies any worsening of his shortness of breath  Cardiovascular: denies palpitations  Gastrointestinal: denies abdominal pain, N/V, Musculoskeletal: denies pain Integumentary: denies any other rashes or skin discolorations except the extensive right flan ecchymosis.   Vital signs in last 24 hours: [min-max] current  Temp:  [97.5 F (36.4 C)-98.7 F (37.1 C)] 97.5 F (36.4 C) (03/22 0730) Pulse Rate:  [75-86] 75 (03/22 0730) Resp:  [18-20] 18 (03/22 0730) BP: (123-149)/(53-62) 127/53 (03/22 0730) SpO2:  [90 %-96 %] 96 % (03/22 0730)     Height: 5\' 7"  (170.2 cm) Weight: 69.6 kg BMI (Calculated): 24.03   Intake/Output last 2 shifts:  03/21 0701 - 03/22 0700 In: -  Out: 800 [Urine:800]   Physical Exam:  Constitutional: arousable, cooperative and no distress  Respiratory: breathing non-labored at rest  Cardiovascular: regular rate and sinus rhythm  Gastrointestinal: soft, non-tender, and non-distended Integumentary: intact  Labs:     Latest Ref Rng & Units 06/23/2022    7:19 AM 06/22/2022    3:25 PM 06/22/2022    7:04 AM  CBC  WBC 4.0 - 10.5 K/uL 14.5  13.8  10.7   Hemoglobin 13.0 - 17.0 g/dL 7.4  8.3  8.0   Hematocrit 39.0 - 52.0 % 22.9  25.0  24.5   Platelets 150 - 400 K/uL 256  269  232       Latest Ref Rng & Units 06/22/2022    3:25 PM 06/20/2022    4:25 AM 06/19/2022   11:45 AM  CMP  Glucose 70 - 99 mg/dL 219  171  231   BUN 8 - 23 mg/dL 31  25  21    Creatinine 0.61 - 1.24 mg/dL 1.01  1.17  1.29   Sodium 135 - 145 mmol/L 133  134  135   Potassium 3.5 -  5.1 mmol/L 4.1  4.3  4.9   Chloride 98 - 111 mmol/L 104  98  97   CO2 22 - 32 mmol/L 23  24  25    Calcium 8.9 - 10.3 mg/dL 8.1  8.7  8.8   Total Protein 6.5 - 8.1 g/dL  6.5  6.7   Total Bilirubin 0.3 - 1.2 mg/dL  0.9  0.7   Alkaline Phos 38 - 126 U/L  62  73   AST 15 - 41 U/L  31  39   ALT 0 - 44 U/L  21  20      Imaging studies:  CLINICAL DATA:  Provided history: Atelectasis of right lung.   EXAM: PORTABLE CHEST 1 VIEW   COMPARISON:  Prior chest radiographs 06/22/2022 and earlier. Chest CT 06/21/2022.   FINDINGS: The cardiomediastinal silhouette is unchanged. Aortic atherosclerosis. Persistent hazy opacity at the right lung base which may reflect pleural fluid, atelectasis, pulmonary contusion and/or airspace consolidation. No appreciable airspace consolidation on the left. Persistent trace left pleural effusion. No evidence of pneumothorax. Known acute rib fractures, as described on the recent prior chest CT of 06/21/2022.   IMPRESSION: 1. Persistent hazy opacity at the right lung base, which may reflect layering pleural fluid, atelectasis, pulmonary  contusion and/or airspace consolidation. 2. Persistent trace left pleural effusion. 3. Aortic Atherosclerosis (ICD10-I70.0).     Electronically Signed   By: Kellie Simmering D.O.   On: 06/23/2022 09:36  Assessment/Plan:  82 y.o. male with multiple right rib fxs and small hemothorax   s/p fall, complicated by pertinent comorbidities including:  Patient Active Problem List   Diagnosis Date Noted   Fall 06/22/2022   Malnutrition of moderate degree 06/22/2022   Acute blood loss anemia 06/21/2022   Multiple rib fractures involving four or more ribs, right side 06/20/2022   Intramuscular hematoma 06/19/2022   Rib fractures 06/19/2022   Hemothorax on right 06/19/2022   Leukocytosis 06/19/2022   Status post coronary artery stent placement 05/02/2022   Mild dementia ([name]) 04/14/2022   Non-ST elevation (NSTEMI) myocardial  infarction ([place]) 04/13/2022   History of COVID-19 04/13/2022   Lung nodule 04/13/2022   PAD (peripheral artery disease) ([place]) 04/13/2022   Flu 06/05/2017   Diabetes mellitus type 2, controlled, with complications ([place]) 0000000   Carotid stenosis 05/14/2013   CAROTID BRUIT, LEFT 04/25/2010   PALPITATIONS 06/18/2009   Hyperlipidemia 04/27/2009   Essential hypertension 04/27/2009   Coronary artery disease of native artery of native heart with stable angina pectoris ([place]) 04/27/2009    - stable CXR appearance.  - continue pulmonary toilet.  Mobilization.   - percutaneous drain unlikely to be significantly beneficial at this time.  If anything should change, best option is to consult with Pulmonary or IR.   - General Surgery has nothing else to offer this gentleman, and will sign off.

## 2022-06-23 NOTE — Telephone Encounter (Signed)
Admitted

## 2022-06-23 NOTE — Significant Event (Signed)
Patient complaining of 4/10 CP.  Vitals stable Patient with CAD history, plavix has been on hold 4 days due to traumatic hemothorax EKG done, not visible at time of this note HS trop ordered, pending at time of this note  I have relatively low suspicion for cardiac CP.  Suspect GI related CP. If trop elevated, recommend trend Consider cardiology evaluation, [facility] on board Continue telemetry Mylanta PRN for indigestion SL nitro added  Ralene Muskrat MD

## 2022-06-23 NOTE — Progress Notes (Signed)
PT Cancellation Note  Patient Details Name: Raymond Sampson MRN: [id number] DOB: [birth year]   Cancelled Treatment:    Reason Eval/Treat Not Completed: Other (comment) (Thrice attempted PT services. Pt heavily somnolent prior to midday, then still drowsy later, author assisted with reposition and elevation of heels, brought HEP for wife to attempt later if pt becomes more alert- upon return, pt awake and wife helping him eat ( we prioritized this over HEP education as pt has been asleep most of morning and needs to obtain calories while actually awake. Returned again in afternoon, pt receiving blood products. Discussed author's goal of coordinating analgesia with session to maximize tolerance due to severely elevated pain levels last 3 days, however today's hypersomnolence would indicate a great degree of caution with analgesia- pt's wife acknowledges. Will continue to follow. Confirmed with wife again today that pt's mentation remains off baseline, more confused. Frequency will be updated to reflect pt's tolerance to services.   5:25 PM, 06/23/22 Etta Grandchild, PT, DPT Physical Therapist - [facility]  [phone] ([place])     Sultana C 06/23/2022, 5:18 PM

## 2022-06-23 NOTE — Care Management Important Message (Signed)
Important Message  Patient Details  Name: Raymond Sampson MRN: [id number] Date of Birth: April 24, 1940   Medicare Important Message Given:  Yes     Dannette Barbara 06/23/2022, 10:45 AM

## 2022-06-23 NOTE — Progress Notes (Signed)
       CROSS COVER NOTE  NAME: Raymond Sampson MRN: [id number] DOB : 22-May-1940    HPI/Events of Note   Report:chest pain 4/10 sternal, pain resolved. Troponin 2653.    On review of chart:CAD 3 vellels, stent 2007. Recently January of this year had PCII to LAD and RCA and the underwent PCI/DES to left circumfles. On DAPT plavix and aspirin Also history of TIA, CVA, a fib Recent fall at home with resultant rib fractures righ 8-12, intramuscular hematoma and small right hemothorax. His plavix was put on hold, had 1 unit PRBC transfusion today     Assessment and  Interventions   Assessment: Alert and oriented. No apparent distress Denied chest pain at time of exam but reported it comes and goes  VSS Plan: X X X

## 2022-06-23 NOTE — TOC Initial Note (Signed)
Transition of Care [facility]) - Initial/Assessment Note    Patient Details  Name: Raymond Sampson MRN: [id number] Date of Birth: 11/21/1940  Transition [facility]) CM/SW Contact:    Candie Chroman, LCSW Phone Number: 06/23/2022, 3:59 PM  Clinical Narrative:  Patient not fully oriented. Wife at bedside. CSW introduced role and explained that therapy recommendations would be discussed. Wife is considering SNF placement but wants to speak to her daughter-in-law about it. Top preferences in order are [place], [place] will review. No further concerns. CSW encouraged patient to contact CSW as needed. CSW will continue to follow patient and his wife for support and facilitate discharge to SNF once medically stable.                Expected Discharge Plan: Skilled Nursing Facility Barriers to Discharge: Continued Medical Work up   Patient Goals and CMS Choice            Expected Discharge Plan and Services     Post Acute Care Choice: [place] Living arrangements for the past 2 months: Single Family Home                                      Prior Living Arrangements/Services Living arrangements for the past 2 months: Single Family Home Lives with:: Spouse Patient language and need for interpreter reviewed:: Yes Do you feel safe going back to the place where you live?: Yes      Need for Family Participation in Patient Care: Yes (Comment) Care giver support system in place?: Yes (comment)   Criminal Activity/Legal Involvement Pertinent to Current Situation/Hospitalization: No - Comment as needed  Activities of Daily Living Home Assistive Devices/Equipment: Dentures (specify type) ADL Screening (condition at time of admission) Patient's cognitive ability adequate to safely complete daily activities?: Yes Is the patient deaf or have difficulty hearing?: No Does the patient have difficulty seeing, even  when wearing glasses/contacts?: No Does the patient have difficulty concentrating, remembering, or making decisions?: No Patient able to express need for assistance with ADLs?: Yes Does the patient have difficulty dressing or bathing?: Yes Independently performs ADLs?: No Communication: Independent Dressing (OT): Needs assistance Is this a change from baseline?: Change from baseline, expected to last <3days Grooming: Independent Feeding: Independent Bathing: Needs assistance Is this a change from baseline?: Change from baseline, expected to last <3 days Toileting: Needs assistance Is this a change from baseline?: Change from baseline, expected to last <3 days In/Out Bed: Needs assistance Is this a change from baseline?: Change from baseline, expected to last <3 days Walks in Home: Needs assistance Is this a change from baseline?: Change from baseline, expected to last <3 days Does the patient have difficulty walking or climbing stairs?: Yes Weakness of Legs: Both Weakness of Arms/Hands: Both  Permission Sought/Granted Permission sought to share information with : Facility Sport and exercise psychologist, Family Supports    Share Information with NAME: Teancum Baldez  Permission granted to share info w AGENCY: SNF's  Permission granted to share info w Relationship: Wife  Permission granted to share info w Contact Information: [phone]  Emotional Assessment Appearance:: Appears stated age Attitude/Demeanor/Rapport: Unable to Assess Affect (typically observed): Unable to Assess Orientation: : Oriented to Self, Oriented to Place Alcohol / Substance Use: Not Applicable Psych Involvement: No (comment)  Admission diagnosis:  Hemothorax on right [J94.2] Intramuscular hematoma [T14.8XXA]  Fall, initial encounter [W19.XXXA] Closed fracture of multiple ribs of right side, initial encounter [S22.41XA] Multiple rib fractures involving four or more ribs [S22.49XA] Patient Active Problem List    Diagnosis Date Noted   Fall 06/22/2022   Malnutrition of moderate degree 06/22/2022   Acute blood loss anemia 06/21/2022   Multiple rib fractures involving four or more ribs, right side 06/20/2022   Intramuscular hematoma 06/19/2022   Rib fractures 06/19/2022   Hemothorax on right 06/19/2022   Leukocytosis 06/19/2022   Status post coronary artery stent placement 05/02/2022   Mild dementia ([place]) 04/14/2022   Non-ST elevation (NSTEMI) myocardial infarction ([place]) 04/13/2022   History of COVID-19 04/13/2022   Lung nodule 04/13/2022   PAD (peripheral artery disease) ([place]) 04/13/2022   Flu 06/05/2017   Diabetes mellitus type 2, controlled, with complications ([place]) 0000000   Carotid stenosis 05/14/2013   CAROTID BRUIT, LEFT 04/25/2010   PALPITATIONS 06/18/2009   Hyperlipidemia 04/27/2009   Essential hypertension 04/27/2009   Coronary artery disease of native artery of native heart with stable angina pectoris ([place]) 04/27/2009   PCP:  [name], MD Pharmacy:   [id number] - [place], [place] - [address] [place] [address] Phone: [phone] Fax: [phone]     Social Determinants of Health (SDOH) Social History: SDOH Screenings   Food Insecurity: No Food Insecurity (06/19/2022)  Housing: Low Risk  (06/19/2022)  Transportation Needs: No Transportation Needs (06/19/2022)  Utilities: Not At Risk (06/19/2022)  Depression (PHQ2-9): Low Risk  (05/17/2022)  Tobacco Use: Medium Risk (06/20/2022)   SDOH Interventions:     Readmission Risk Interventions     No data to display

## 2022-06-23 NOTE — NC FL2 (Signed)
[place] LEVEL OF CARE FORM     IDENTIFICATION  Patient Name: Raymond Sampson Birthdate: 1941/02/25 Sex: male Admission Date (Current Location): 06/19/2022  [facility] and [place] Number:  Engineering geologist and Address:  [facility], [address], [place], [place] [address]      Provider Number: [id number]  Attending Physician Name and Address:  Sidney Ace, MD  Relative Name and Phone Number:       Current Level of Care: Hospital Recommended Level of Care: [place] Prior Approval Number:    Date Approved/Denied:   PASRR Number: [id number]  Discharge Plan: SNF    Current Diagnoses: Patient Active Problem List   Diagnosis Date Noted   Fall 06/22/2022   Malnutrition of moderate degree 06/22/2022   Acute blood loss anemia 06/21/2022   Multiple rib fractures involving four or more ribs, right side 06/20/2022   Intramuscular hematoma 06/19/2022   Rib fractures 06/19/2022   Hemothorax on right 06/19/2022   Leukocytosis 06/19/2022   Status post coronary artery stent placement 05/02/2022   Mild dementia ([place]) 04/14/2022   Non-ST elevation (NSTEMI) myocardial infarction ([place]) 04/13/2022   History of COVID-19 04/13/2022   Lung nodule 04/13/2022   PAD (peripheral artery disease) ([place]) 04/13/2022   Flu 06/05/2017   Diabetes mellitus type 2, controlled, with complications ([place]) 0000000   Carotid stenosis 05/14/2013   CAROTID BRUIT, LEFT 04/25/2010   PALPITATIONS 06/18/2009   Hyperlipidemia 04/27/2009   Essential hypertension 04/27/2009   Coronary artery disease of native artery of native heart with stable angina pectoris ([place]) 04/27/2009    Orientation RESPIRATION BLADDER Height & Weight     Self, Place  O2 (Nasal Cannula 3 L) Incontinent, External catheter Weight: 153 lb 7 oz (69.6 kg) Height:  5\' 7"  (170.2 cm)  BEHAVIORAL SYMPTOMS/MOOD NEUROLOGICAL BOWEL NUTRITION STATUS   (None)  (None)  Incontinent Diet (Regular)  AMBULATORY STATUS COMMUNICATION OF NEEDS Skin   Total Care Verbally Bruising                       Personal Care Assistance Level of Assistance  Bathing, Feeding, Dressing Bathing Assistance: Maximum assistance Feeding assistance: Maximum assistance Dressing Assistance: Maximum assistance     Functional Limitations Info  Sight, Hearing, Speech Sight Info: Adequate Hearing Info: Adequate Speech Info: Adequate    SPECIAL CARE FACTORS FREQUENCY  PT (By licensed PT), OT (By licensed OT), Speech therapy     PT Frequency: 5 x week OT Frequency: 5 x week     Speech Therapy Frequency: 5 x week      Contractures Contractures Info: Not present    Additional Factors Info  Code Status, Allergies Code Status Info: Full code Allergies Info: Metformin and related, Simvastatin           Current Medications (06/23/2022):  This is the current hospital active medication list Current Facility-Administered Medications  Medication Dose Route Frequency Provider Last Rate Last Admin   0.9 %  sodium chloride infusion (Manually program via Guardrails IV Fluids)   Intravenous Once Ralene Muskrat B, MD       acetaminophen (TYLENOL) tablet 650 mg  650 mg Oral Q6H PRN Jennye Boroughs, MD   650 mg at 06/23/22 0951   aspirin EC tablet 81 mg  81 mg Oral Daily Christell Faith M, PA-C   81 mg at 06/23/22 0950   atorvastatin (LIPITOR) tablet 80 mg  80 mg Oral Daily Shanda Howells  J, MD   80 mg at 06/23/22 [id number]   clopidogrel (PLAVIX) tablet 75 mg  75 mg Oral Daily Theora Gianotti, NP   75 mg at 06/23/22 1359   feeding supplement (ENSURE ENLIVE / ENSURE PLUS) liquid 237 mL  237 mL Oral TID BM Jennye Boroughs, MD   237 mL at 06/23/22 1400   insulin aspart (novoLOG) injection 0-5 Units  0-5 Units Subcutaneous QHS Deneise Lever, MD       insulin aspart (novoLOG) injection 0-9 Units  0-9 Units Subcutaneous TID WC Deneise Lever, MD   3 Units at 06/23/22 1359    lisinopril (ZESTRIL) tablet 20 mg  20 mg Oral Daily Deneise Lever, MD   20 mg at 06/23/22 0951   metoprolol tartrate (LOPRESSOR) tablet 25 mg  25 mg Oral BID Deneise Lever, MD   25 mg at 06/23/22 0949   morphine (PF) 2 MG/ML injection 2 mg  2 mg Intravenous Q4H PRN Jennye Boroughs, MD       multivitamin with minerals tablet 1 tablet  1 tablet Oral Daily Jennye Boroughs, MD   1 tablet at 06/23/22 0949   ondansetron (ZOFRAN) injection 4 mg  4 mg Intravenous Once Lavonia Drafts, MD       ondansetron [facility]) tablet 4 mg  4 mg Oral Q6H PRN Deneise Lever, MD       Or   ondansetron [facility]) injection 4 mg  4 mg Intravenous Q6H PRN Deneise Lever, MD       oxyCODONE (Oxy IR/ROXICODONE) immediate release tablet 5 mg  5 mg Oral Q6H PRN Jennye Boroughs, MD       pantoprazole (PROTONIX) EC tablet 20 mg  20 mg Oral Daily Deneise Lever, MD   20 mg at 06/23/22 [id number]   polyvinyl alcohol (LIQUIFILM TEARS) 1.4 % ophthalmic solution 1 drop  1 drop Both Eyes PRN Jennye Boroughs, MD       sertraline (ZOLOFT) tablet 25 mg  25 mg Oral Daily Deneise Lever, MD   25 mg at 06/23/22 [id number]     Discharge Medications: Please see discharge summary for a list of discharge medications.  Relevant Imaging Results:  Relevant Lab Results:   Additional Information SS#: [id number]  Candie Chroman, LCSW

## 2022-06-23 NOTE — Progress Notes (Signed)
Cardiology Progress Note   Patient Name: Raymond Sampson Date of Encounter: 06/23/2022  Primary Cardiologist: Ida Rogue, MD  Subjective   Ongoing R lower back pain.  No chest pain.  Family at bedside.  Very groggy - didn't sleep well.  Difficulty swallowing reported by nsg staff.  Seen by speech rx w/ recs for thickened liquids.  Inpatient Medications    Scheduled Meds:  aspirin EC  81 mg Oral Daily   atorvastatin  80 mg Oral Daily   feeding supplement  237 mL Oral TID BM   insulin aspart  0-5 Units Subcutaneous QHS   insulin aspart  0-9 Units Subcutaneous TID WC   lisinopril  20 mg Oral Daily   metoprolol tartrate  25 mg Oral BID   multivitamin with minerals  1 tablet Oral Daily   ondansetron (ZOFRAN) IV  4 mg Intravenous Once   pantoprazole  20 mg Oral Daily   sertraline  25 mg Oral Daily   Continuous Infusions:  PRN Meds: acetaminophen, morphine injection, ondansetron **OR** ondansetron (ZOFRAN) IV, oxyCODONE, polyvinyl alcohol   Vital Signs    Vitals:   06/22/22 1717 06/22/22 2026 06/23/22 0458 06/23/22 0730  BP: 131/62 (!) 149/61 123/61 (!) 127/53  Pulse: 83 86 78 75  Resp: 18 20 18 18   Temp: 98 F (36.7 C) 97.6 F (36.4 C) (!) 97.5 F (36.4 C) (!) 97.5 F (36.4 C)  TempSrc:  Oral Oral Axillary  SpO2: 94% 90% 93% 96%  Weight:      Height:        Intake/Output Summary (Last 24 hours) at 06/23/2022 1233 Last data filed at 06/23/2022 0744 Gross per 24 hour  Intake --  Output 975 ml  Net -975 ml   Filed Weights   06/19/22 2124  Weight: 69.6 kg    Physical Exam   GEN: Well nourished, well developed, in no acute distress.  HEENT: Grossly normal.  Neck: Supple, no JVD, carotid bruits, or masses. Cardiac: RRR, no murmurs, rubs, or gallops. No clubbing, cyanosis, edema.  Radials 2+, DP/PT 2+ and equal bilaterally.  Respiratory:  Respirations regular and unlabored, clear to auscultation bilaterally. GI: Soft, nontender, nondistended, BS + x  4. MS: no deformity or atrophy. Skin: warm and dry, no rash. Neuro:  Strength and sensation are intact. Psych: AAOx3.  Normal affect.  Labs    Chemistry Recent Labs  Lab 06/19/22 1145 06/20/22 0425 06/22/22 1525  NA 135 134* 133*  K 4.9 4.3 4.1  CL 97* 98 104  CO2 25 24 23   GLUCOSE 231* 171* 219*  BUN 21 25* 31*  CREATININE 1.29* 1.17 1.01  CALCIUM 8.8* 8.7* 8.1*  PROT 6.7 6.5  --   ALBUMIN 3.4* 3.4*  --   AST 39 31  --   ALT 20 21  --   ALKPHOS 73 62  --   BILITOT 0.7 0.9  --   GFRNONAA 56* >60 >60  ANIONGAP 13 12 6      Hematology Recent Labs  Lab 06/22/22 0704 06/22/22 1525 06/23/22 0719  WBC 10.7* 13.8* 14.5*  RBC 2.66* 2.72* 2.46*  HGB 8.0* 8.3* 7.4*  HCT 24.5* 25.0* 22.9*  MCV 92.1 91.9 93.1  MCH 30.1 30.5 30.1  MCHC 32.7 33.2 32.3  RDW 13.9 13.8 14.0  PLT 232 269 256    Lipids  Lab Results  Component Value Date   CHOL 205 (H) 04/13/2022   HDL 40 (L) 04/13/2022   LDLCALC 114 (H) 04/13/2022  TRIG 257 (H) 04/13/2022   CHOLHDL 5.1 04/13/2022    HbA1c  Lab Results  Component Value Date   HGBA1C 6.8 (H) 04/12/2022    Radiology    DG Chest Port 1 View  Result Date: 06/23/2022 CLINICAL DATA:  Provided history: Atelectasis of right lung. EXAM: PORTABLE CHEST 1 VIEW COMPARISON:  Prior chest radiographs 06/22/2022 and earlier. Chest CT 06/21/2022. FINDINGS: The cardiomediastinal silhouette is unchanged. Aortic atherosclerosis. Persistent hazy opacity at the right lung base which may reflect pleural fluid, atelectasis, pulmonary contusion and/or airspace consolidation. No appreciable airspace consolidation on the left. Persistent trace left pleural effusion. No evidence of pneumothorax. Known acute rib fractures, as described on the recent prior chest CT of 06/21/2022. IMPRESSION: 1. Persistent hazy opacity at the right lung base, which may reflect layering pleural fluid, atelectasis, pulmonary contusion and/or airspace consolidation. 2. Persistent  trace left pleural effusion. 3. Aortic Atherosclerosis (ICD10-I70.0). Electronically Signed   By: Kellie Simmering D.O.   On: 06/23/2022 09:36   DG CHEST PORT 1 VIEW  Result Date: 06/22/2022 CLINICAL DATA:  Pneumothorax EXAM: PORTABLE CHEST 1 VIEW COMPARISON:  06/20/2022. FINDINGS: Right base opacity suggests layering effusion. Lungs are otherwise clear. No pneumothorax identified. Normal pulmonary vasculature. Normal cardiac silhouette. Fractures identified of the right posterior tenth and eleventh ribs. Additional fractures described on CT are not evident on plain film. IMPRESSION: Right base consolidation or volume loss and effusion. Electronically Signed   By: Sammie Bench M.D.   On: 06/22/2022 08:54   CT HEAD WO CONTRAST (5MM)  Result Date: 06/21/2022 CLINICAL DATA:  Fall, bleeding EXAM: CT HEAD WITHOUT CONTRAST TECHNIQUE: Contiguous axial images were obtained from the base of the skull through the vertex without intravenous contrast. RADIATION DOSE REDUCTION: This exam was performed according to the departmental dose-optimization program which includes automated exposure control, adjustment of the mA and/or kV according to patient size and/or use of iterative reconstruction technique. COMPARISON:  06/05/2017 FINDINGS: Brain: No evidence of acute infarction, hemorrhage, mass, mass effect, or midline shift. No hydrocephalus or extra-axial fluid collection. Redemonstrated encephalomalacia in the left parieto-occipital region. Left posterior fossa arachnoid cyst. Periventricular white matter changes, likely the sequela of chronic small vessel ischemic disease. Vascular: No hyperdense vessel. Atherosclerotic calcifications in the intracranial carotid and vertebral arteries. Skull: Negative for fracture or focal lesion. Sinuses/Orbits: Mucosal thickening in the maxillary sinuses. No acute finding in the orbits. Other: The mastoid air cells are well aerated. IMPRESSION: No acute intracranial process.  Electronically Signed   By: Merilyn Baba M.D.   On: 06/21/2022 14:34   CT CHEST WO CONTRAST  Result Date: 06/21/2022 CLINICAL DATA:  Right hemothorax, anemia EXAM: CT CHEST WITHOUT CONTRAST TECHNIQUE: Multidetector CT imaging of the chest was performed following the standard protocol without IV contrast. RADIATION DOSE REDUCTION: This exam was performed according to the departmental dose-optimization program which includes automated exposure control, adjustment of the mA and/or kV according to patient size and/or use of iterative reconstruction technique. COMPARISON:  CT 06/19/2022 FINDINGS: Cardiovascular: Heart size is normal. No pericardial effusion. Relative hypoattenuation of the cardiac blood pool indicative of anemia. Thoracic aortic arch measures 3.8 cm in diameter. Atherosclerotic calcification of the aorta and coronary arteries. Central pulmonary vasculature is within normal limits. Mediastinum/Nodes: No new or enlarging lymph nodes within the chest. Thyroid, trachea, and esophagus demonstrate no significant findings. No mediastinal hematoma. No pneumomediastinum. Lungs/Pleura: Small-moderate-sized right hemothorax, increased from prior. Minimal air within the right pleural space. Near-complete right lower lobe atelectasis/consolidation, significantly  progressed. Trace left pleural effusion with new dependent opacities in the left lower lobe and lingula. Background of mild-to-moderate emphysema. Upper Abdomen: No new or acute findings within the imaged upper abdomen. Musculoskeletal: Redemonstration of multiple acute right-sided rib fractures including the right eighth through twelfth ribs as well as the right T11 transverse process. The degree of displacement of the right eighth rib fracture has increased since prior (series 2, image 107). Posterior paraspinal intramuscular hematoma centered at the posterior right eleventh rib fracture measures approximately 8.0 x 3.4 x 7.5 cm, similar in size to the  previous study. No subcutaneous emphysema of the chest wall. IMPRESSION: 1. Small-moderate-sized right hemothorax, increased from prior study. Minimal air within the right pleural space. 2. Near-complete right lower lobe atelectasis/consolidation, significantly progressed since prior study. 3. Trace left pleural effusion with new dependent opacities in the left lower lobe and lingula, which may represent atelectasis or developing pneumonia. 4. Redemonstration of multiple acute right-sided rib fractures including the right eighth through twelfth ribs as well as the right T11 transverse process. The degree of displacement of the right eighth rib fracture has increased since prior study. 5. Posterior paraspinal intramuscular hematoma centered at the posterior right eleventh rib fracture measures approximately 8.0 x 3.4 x 7.5 cm, similar in size to the previous study. 6. Aortic and coronary artery atherosclerosis (ICD10-I70.0). 7. Thoracic aortic arch measures 3.8 cm in diameter. Recommend annual imaging followup by CTA or MRA. This recommendation follows 2010 ACCF/AHA/AATS/ACR/ASA/SCA/SCAI/SIR/STS/SVM Guidelines for the Diagnosis and Management of Patients with Thoracic Aortic Disease. Circulation.2010; [id number]. Aortic aneurysm NOS (ICD10-I71.9) Electronically Signed   By: Davina Poke D.O.   On: 06/21/2022 09:46   DG Chest Port 1 View  Result Date: 06/20/2022 CLINICAL DATA:  Right hemothorax. EXAM: PORTABLE CHEST 1 VIEW COMPARISON:  June 19, 2022. FINDINGS: The heart size and mediastinal contours are within normal limits. Mild bibasilar subsegmental atelectasis is noted. No definite pneumothorax or pleural effusion is noted. The visualized skeletal structures are unremarkable. IMPRESSION: Mild bibasilar subsegmental atelectasis. Electronically Signed   By: Marijo Conception M.D.   On: 06/20/2022 14:12   CT CHEST ABDOMEN PELVIS W CONTRAST  Result Date: 06/19/2022 CLINICAL DATA:  Blunt trauma. Fall at  home in the shower. Right flank pain. EXAM: CT CHEST, ABDOMEN, AND PELVIS WITH CONTRAST TECHNIQUE: Multidetector CT imaging of the chest, abdomen and pelvis was performed following the standard protocol during bolus administration of intravenous contrast. RADIATION DOSE REDUCTION: This exam was performed according to the departmental dose-optimization program which includes automated exposure control, adjustment of the mA and/or kV according to patient size and/or use of iterative reconstruction technique. CONTRAST:  131mL OMNIPAQUE IOHEXOL 300 MG/ML  SOLN COMPARISON:  Chest radiograph earlier today. Chest CT 04/12/2022 FINDINGS: CT CHEST FINDINGS Cardiovascular: No evidence of acute aortic or vascular injury. Moderate aortic atherosclerosis and tortuosity coronary artery calcifications versus stents. The heart is normal in size. No pericardial effusion. Mediastinum/Nodes: Mediastinal hemorrhage or hematoma. No pneumomediastinum. Small mediastinal lymph nodes are all subcentimeter short axis, for example lower paratracheal node measures 7 mm series 2, image 19. Prominent right hilar node, series 2, image 24, partially motion obscured but similar to prior chest CT. No esophageal wall thickening. No visible thyroid nodule. Lungs/Pleura: No pneumothorax. Small right hemothorax with adjacent atelectasis or contusion in the right lower lobe. Background emphysema. Irregular nodular density in the basilar left lower lobe, series 4, image 99 measures 1.7 x 1.2 cm, previously 2.5 x 1.1 cm. Improvement but  persistent heterogeneous ground-glass opacities. No endobronchial lesion. Musculoskeletal: Multiple posterior lower rib fractures. There are fractures of the right posterior eighth through twelfth ribs; the ninth, tenth, and eleventh rib fractures are displaced and segmental. There is also fracture of the right T11 transverse process. No fracture extension into the vertebral body, middle or anterior columns. No left rib  fractures. The sternum, included clavicles and shoulder girdles are intact. Large intramuscular hematoma within the right paraspinal as well as posterior flank musculature. Overlying subcutaneous edema. CT ABDOMEN PELVIS FINDINGS Hepatobiliary: Despite overlying right rib fractures, there is no evidence of hepatic injury or perihepatic hematoma. No focal liver abnormality. Mild gallbladder distention without calcified gallstone or pericholecystic inflammation. Common bile duct measures 7 mm, likely normal for age. Pancreas: No evidence of injury. No ductal dilatation or inflammation. Spleen: No splenic injury or perisplenic hematoma. Adrenals/Urinary Tract: 17 mm right adrenal nodule with Hounsfield units of 38, however on prior exam this with low density and consistent with benign adenoma. No evidence of adrenal hemorrhage. Slight thickening of the right pararenal fascia of but no renal injury. No pararenal hematoma. Early excretion of IV contrast in both renal collecting systems. No suspicious renal lesion. The urinary bladder is partially distended, no bladder injury. Stomach/Bowel: No evidence of bowel injury or mesenteric hematoma. No bowel wall thickening or inflammation. No obstruction. Moderate volume of stool throughout the colon. Moderate rectal distention with stool. Occasional left colonic diverticula without diverticulitis. Vascular/Lymphatic: No vascular injury. No retroperitoneal fluid. Irregular calcified and noncalcified atheromatous plaque throughout the abdominal aorta. Patent portal vein. No abdominopelvic adenopathy. Reproductive: Prostate is unremarkable. Other: Subcutaneous contusion involving the right flank. There is also intramuscular hematoma involving the posterior flank musculature. No intra-abdominal hematoma or free air. Musculoskeletal: No acute fracture of the lumbar spine or pelvis. L5 is sacralized. The pubic symphysis is fused. There is a bone island in the right iliac bone.  IMPRESSION: 1. Multiple right posterior lower rib fractures from rib 8 through 12. The ninth, tenth, and eleventh rib fractures are displaced and segmental. There is also fracture of the right T11 transverse process. 2. Small right hemothorax with adjacent atelectasis or contusion in the right lower lobe. No pneumothorax. 3. Large intramuscular hematoma within the right paraspinal and posterior flank musculature. 4. No evidence of acute traumatic injury to the abdomen or pelvis. 5. Irregular nodular density in the basilar left lower lobe persists, however is slightly decreased in size from prior exam. It is unclear if decrease in size is due to differences in slice selection or screw decrease in size from prior exam. Recommend additional follow-up chest CT or PET CT in 3 months. Aortic Atherosclerosis (ICD10-I70.0) and Emphysema (ICD10-J43.9). Electronically Signed   By: Keith Rake M.D.   On: 06/19/2022 15:13    Cardiac Studies   Cardiac catheterization May 02, 2022 Patent LAD stents with stable mild in-stent restenosis of remotely placed mid-distal LAD stents, unchanged from completion of last catheterization.  Recently placed proximal LAD stent is widely patent. Severe LCx/OM2 disease, similar to prior catheterizations. Mildly elevated left ventricular filling pressure. Successful complex bifurcation PCI to LCx/OM2 using Culotte technique (Onyx frontier 2.75 x 30 mm main branch and 2.5 x 15 mm side branch drug-eluting stents) with 0% residual stenosis and TIMI-3 flow.  Patient Profile     Mr. Bays is a 82 YO male with history of PCI in 2007 and NSTEMI in January 2024 status post PCI to the LAD and RCA followed by staged PCI to  the left circumflex, bilateral carotid artery disease, dementia, type 2 diabetes, hypertension, prior tobacco use that is currently admitted after a mechanical fall leading to multiple traumatic posterior rib fractures, right-sided hemothorax, intramuscular hematoma  and anemia.   Assessment & Plan    1.  Mechanical fall w/ multiple rib fractures, right-sided hemothorax, and intramuscular hematoma: Presented following mechanical fall slipping in the bathtub.  Status post packed red blood cells.  Hemoglobin down slightly this morning at 7.4.  Currently on aspirin or Plavix on hold, though we will resume today given stability of hemothorax.  Follow hgb w/ low-threshold to   2.  Multivessel CAD: status post PCI/DES to the LCX/OM2 in January.  No chest pain.  Currently on ASA w/ plan to resume plavix today. No chest pain.  Cont statin.  3.  Atrial tachycardia:  quiescent on ? blocker.  4.  Essential HTN:  stable on ? blocker and acei.  5.  H/o TIA/CVA:  No new Ss.  Cont asa/statin.  6.  Symptomatic anemia:  see #1.  Sl drop in H/H compared to yesterday.  Follow w/resumption of plavix.  Low-threshold to repeat transfusion to maintain Hgb >8 in setting of known CAD.  Signed, Murray Hodgkins, NP  06/23/2022, 12:33 PM    For questions or updates, please contact   Please consult [email or web address] for contact info under Cardiology/STEMI.

## 2022-06-23 NOTE — Telephone Encounter (Signed)
Pt currently admitted. Entered reminder to schedule appointment with Dr. Fletcher Anon for [facility] consult. Closing encounter.

## 2022-06-24 ENCOUNTER — Other Ambulatory Visit: Payer: Self-pay

## 2022-06-24 DIAGNOSIS — S2241XA Multiple fractures of ribs, right side, initial encounter for closed fracture: Secondary | ICD-10-CM

## 2022-06-24 DIAGNOSIS — I25118 Atherosclerotic heart disease of native coronary artery with other forms of angina pectoris: Secondary | ICD-10-CM | POA: Diagnosis not present

## 2022-06-24 DIAGNOSIS — T148XXA Other injury of unspecified body region, initial encounter: Secondary | ICD-10-CM | POA: Diagnosis not present

## 2022-06-24 DIAGNOSIS — Z515 Encounter for palliative care: Secondary | ICD-10-CM

## 2022-06-24 DIAGNOSIS — J942 Hemothorax: Secondary | ICD-10-CM | POA: Diagnosis not present

## 2022-06-24 DIAGNOSIS — W19XXXA Unspecified fall, initial encounter: Secondary | ICD-10-CM | POA: Diagnosis not present

## 2022-06-24 DIAGNOSIS — Z7189 Other specified counseling: Secondary | ICD-10-CM

## 2022-06-24 LAB — CBC
HCT: 27 % — ABNORMAL LOW (ref 39.0–52.0)
Hemoglobin: 8.8 g/dL — ABNORMAL LOW (ref 13.0–17.0)
MCH: 29.8 pg (ref 26.0–34.0)
MCHC: 32.6 g/dL (ref 30.0–36.0)
MCV: 91.5 fL (ref 80.0–100.0)
Platelets: 261 10*3/uL (ref 150–400)
RBC: 2.95 MIL/uL — ABNORMAL LOW (ref 4.22–5.81)
RDW: 13.9 % (ref 11.5–15.5)
WBC: 12.5 10*3/uL — ABNORMAL HIGH (ref 4.0–10.5)
nRBC: 0 % (ref 0.0–0.2)

## 2022-06-24 LAB — BASIC METABOLIC PANEL
Anion gap: 10 (ref 5–15)
BUN: 36 mg/dL — ABNORMAL HIGH (ref 8–23)
CO2: 24 mmol/L (ref 22–32)
Calcium: 8.1 mg/dL — ABNORMAL LOW (ref 8.9–10.3)
Chloride: 100 mmol/L (ref 98–111)
Creatinine, Ser: 0.86 mg/dL (ref 0.61–1.24)
GFR, Estimated: >60 mL/min (ref >60)
Glucose, Bld: 190 mg/dL — ABNORMAL HIGH (ref 70–99)
Potassium: 4.1 mmol/L (ref 3.5–5.1)
Sodium: 134 mmol/L — ABNORMAL LOW (ref 135–145)

## 2022-06-24 LAB — GLUCOSE, CAPILLARY
Glucose-Capillary: 174 mg/dL — ABNORMAL HIGH (ref 70–99)
Glucose-Capillary: 254 mg/dL — ABNORMAL HIGH (ref 70–99)
Glucose-Capillary: 195 mg/dL — ABNORMAL HIGH (ref 70–99)
Glucose-Capillary: 179 mg/dL — ABNORMAL HIGH (ref 70–99)

## 2022-06-24 LAB — TYPE AND SCREEN
ABO/RH(D): O POS
Antibody Screen: NEGATIVE
Unit division: 0
Unit division: 0

## 2022-06-24 LAB — BPAM RBC
Blood Product Expiration Date: 202404232359
Blood Product Expiration Date: 202404282359
ISSUE DATE / TIME: 202403201659
ISSUE DATE / TIME: 202403221547
Unit Type and Rh: 5100
Unit Type and Rh: 5100

## 2022-06-24 LAB — TROPONIN I (HIGH SENSITIVITY)
Troponin I (High Sensitivity): 4936 ng/L (ref <18)
Troponin I (High Sensitivity): 3814 ng/L (ref <18)

## 2022-06-24 LAB — MAGNESIUM: Magnesium: 2.1 mg/dL (ref 1.7–2.4)

## 2022-06-24 MED ORDER — METOPROLOL TARTRATE 5 MG/5ML IV SOLN
5.0000 mg | Freq: Once | INTRAVENOUS | Status: AC
  Administered 2022-06-24: 5 mg via INTRAVENOUS
  Filled 2022-06-24: qty 5

## 2022-06-24 MED ORDER — FLUTICASONE PROPIONATE 50 MCG/ACT NA SUSP
1.0000 | Freq: Every day | NASAL | Status: DC
  Administered 2022-06-24 – 2022-07-03 (×10): 1 via NASAL
  Filled 2022-06-24: qty 16

## 2022-06-24 MED ORDER — GUAIFENESIN ER 600 MG PO TB12
600.0000 mg | ORAL_TABLET | Freq: Two times a day (BID) | ORAL | Status: DC
  Administered 2022-06-24 – 2022-07-03 (×19): 600 mg via ORAL
  Filled 2022-06-24 (×19): qty 1

## 2022-06-24 NOTE — Progress Notes (Signed)
PROGRESS NOTE    Raymond Sampson  [id number] DOB: 22-Jul-1940 DOA: 06/19/2022 PCP: [name], MD    Brief Narrative:  Raymond Sampson is a 82 y.o. male with medical history significant of coronary artery disease status post multiple stenting's with most recent stenting January 2024 x 2, carotid disease, CVA, type 2 diabetes, dementia, hyperlipidemia, hypertension, who presented to the hospital after a fall at home.  Reportedly, he slipped on soap in the tub and landed on his right side.   Workup revealed multiple right rib fractures (8 to 12), intramuscular hematoma within the right paraspinal and posterior flank musculature and small right hemothorax.  Plavix was held given 9 acute bleed.  General surgery consulted.  No surgical intervention indicated for hemothorax.  Stable appearance on imaging.  Cardiology to restart Plavix 3/22.  3/23: Patient transferred to progressive after high-sensitivity troponin 3000+ after reports of chest pain.  EKG reviewed, nonischemic.  Patient asymptomatic other than that and chest pain resolved.  Troponins trended, peaked at approximately 5000.   Assessment & Plan:   Principal Problem:   Intramuscular hematoma Active Problems:   Rib fractures   Status post coronary artery stent placement   Hyperlipidemia   Essential hypertension   Coronary artery disease of native artery of native heart with stable angina pectoris (HCC)   Diabetes mellitus type 2, controlled, with complications (HCC)   Hemothorax on right   Leukocytosis   Multiple rib fractures involving four or more ribs, right side   Acute blood loss anemia   Fall   Malnutrition of moderate degree  Multiple right rib fractures (8th through 12th), right T11 transverse process fracture, intramuscular hematoma within the right paraspinal and posterior flank musculature, and  right hemothorax, s/p fall: CT chest without contrast (done on 06/21/2022) showed small to moderate size right  pneumothorax (increased from prior study), near complete right lower lobe atelectasis/consolidation new dependent opacities in the left lower lobe and lingula which may present atelectasis or developing pneumonia.  Clinically, doubt pneumonia at this time.  He was evaluated by Dr. Sima Matas, general surgeon.  Conservative management recommended at this time.  General surgery has signed off as of 3/22 As needed pain control.  Stress incentive spirometry use.  Mobility as tolerated.  Wean oxygen as tolerated  Elevated troponin Noted on 3/22 after complaints of chest pain.  At this point EKG with ischemic appearing.  I suspect supply demand ischemia.  Patient likely not a good candidate for any kind of ischemic evaluation given multiple rib fractures, hemothorax, high risk for bleeding.  Cardiology follow-up.  Continue telemetry monitoring.    Acute blood loss anemia: Hemoglobin was 9.8 on admission.  Has dropped to 7.4.  Patient remains hemodynamically stable.  Stable appearance of hemothorax.  Cardiology to restart Plavix 3/22.  Transfused 1 unit PRBC on 3/22.  Hemoglobin stable.  No evidence of acute blood loss.    Acute hypoxic respiratory failure: He is on 3 L/min oxygen via [place].  Wean off oxygen as able.  Stress incentive spirometry use.  Transfusion as above.     CAD s/p coronary stent in January 2024, history of stroke: Continue aspirin.  Plavix has been held because of hemothorax.  Plavix restarted 3/22.  Patient had some chest pain at night.  Troponins trended.     Type II DM with hyperglycemia: NovoLog as needed.  Metformin and Januvia have been held.   Dementia: Continue supportive care  DVT prophylaxis: SCD Code Status: Full Family  Communication: Family member at bedside 3/22, spouse at bedside 3/23 Disposition Plan: Status is: Inpatient Remains inpatient appropriate because: Acute respiratory failure.  Acute blood loss anemia.  Multiple rib fractures requiring pain  control.   Level of care: Progressive  Consultants:  Cardiology General surgery, signed off  Procedures:  None  Antimicrobials: None   Subjective: Seen and examined.  Patient sleepy/lethargic.  Slightly improved from yesterday.  Spouse at bedside.  Objective: Vitals:   06/24/22 0107 06/24/22 0500 06/24/22 0745 06/24/22 1149  BP: (!) 150/68 (!) 109/95 (!) 148/66 (!) 140/65  Pulse: 90 87 85 83  Resp:  16 18 18   Temp:  99.6 F (37.6 C) 99 F (37.2 C) 99 F (37.2 C)  TempSrc:      SpO2: 95% 92% 92% 96%  Weight:      Height:        Intake/Output Summary (Last 24 hours) at 06/24/2022 1407 Last data filed at 06/24/2022 0900 Gross per 24 hour  Intake 557.83 ml  Output 800 ml  Net -242.17 ml   Filed Weights   06/19/22 2124  Weight: 69.6 kg    Examination:  General exam: Lethargic.  Ill appearing Respiratory system: Bibasilar crackles.  Otherwise clear.  Normal work of breathing.  3 L Cardiovascular system: S1-S2, RRR, no murmurs, no pedal edema Gastrointestinal system: Soft,/ND, normal bowel sounds Central nervous system: Lethargic but arousable.  Oriented.  No focal deficits Extremities: Unable to assess power Skin: No rashes, lesions or ulcers Psychiatry: Judgement and insight appear impaired. Mood & affect flattened.     Data Reviewed: I have personally reviewed following labs and imaging studies  CBC: Recent Labs  Lab 06/21/22 0510 06/21/22 2148 06/22/22 0704 06/22/22 1525 06/23/22 0719 06/24/22 0115  WBC 10.5 9.7 10.7* 13.8* 14.5* 12.5*  NEUTROABS 8.1*  --   --  11.8*  --   --   HGB 6.7* 7.9* 8.0* 8.3* 7.4* 8.8*  HCT 20.7* 23.7* 24.5* 25.0* 22.9* 27.0*  MCV 93.2 91.9 92.1 91.9 93.1 91.5  PLT 235 226 232 269 256 0000000   Basic Metabolic Panel: Recent Labs  Lab 06/19/22 1145 06/20/22 0425 06/22/22 1525 06/23/22 1428 06/24/22 0115  NA 135 134* 133* 136 134*  K 4.9 4.3 4.1 4.0 4.1  CL 97* 98 104 104 100  CO2 25 24 23 24 24   GLUCOSE 231*  171* 219* 182* 190*  BUN 21 25* 31* 38* 36*  CREATININE 1.29* 1.17 1.01 1.02 0.86  CALCIUM 8.8* 8.7* 8.1* 8.1* 8.1*  MG  --   --   --  2.3 2.1   GFR: Estimated Creatinine Clearance: 63 mL/min (by C-G formula based on SCr of 0.86 mg/dL). Liver Function Tests: Recent Labs  Lab 06/19/22 1145 06/20/22 0425  AST 39 31  ALT 20 21  ALKPHOS 73 62  BILITOT 0.7 0.9  PROT 6.7 6.5  ALBUMIN 3.4* 3.4*   No results for input(s): "LIPASE", "AMYLASE" in the last 168 hours. No results for input(s): "AMMONIA" in the last 168 hours. Coagulation Profile: No results for input(s): "INR", "PROTIME" in the last 168 hours. Cardiac Enzymes: No results for input(s): "CKTOTAL", "CKMB", "CKMBINDEX", "TROPONINI" in the last 168 hours. BNP (last 3 results) No results for input(s): "PROBNP" in the last 8760 hours. HbA1C: No results for input(s): "HGBA1C" in the last 72 hours. CBG: Recent Labs  Lab 06/23/22 1202 06/23/22 1611 06/23/22 2045 06/24/22 0741 06/24/22 1146  GLUCAP 203* 152* 224* 174* 254*   Lipid Profile: No  results for input(s): "CHOL", "HDL", "LDLCALC", "TRIG", "CHOLHDL", "LDLDIRECT" in the last 72 hours. Thyroid Function Tests: No results for input(s): "TSH", "T4TOTAL", "FREET4", "T3FREE", "THYROIDAB" in the last 72 hours. Anemia Panel: No results for input(s): "VITAMINB12", "FOLATE", "FERRITIN", "TIBC", "IRON", "RETICCTPCT" in the last 72 hours. Sepsis Labs: No results for input(s): "PROCALCITON", "LATICACIDVEN" in the last 168 hours.  No results found for this or any previous visit (from the past 240 hour(s)).       Radiology Studies: DG Chest Port 1 View  Result Date: 06/23/2022 CLINICAL DATA:  Chest pain, NSTEMI EXAM: PORTABLE CHEST 1 VIEW COMPARISON:  Portable exam 1759 hours compared to 0730 hours FINDINGS: Normal heart size, mediastinal contours, and pulmonary vascularity. Coronary stents noted. Small RIGHT pleural effusion and mild bibasilar atelectasis versus  infiltrate. Upper lungs clear. No pneumothorax. Mildly displaced fractures of the posterior RIGHT tenth and eleventh ribs. IMPRESSION: Mild bibasilar infiltrate versus atelectasis and minimal RIGHT pleural effusion. Electronically Signed   By: Lavonia Dana M.D.   On: 06/23/2022 18:07   DG Chest Port 1 View  Result Date: 06/23/2022 CLINICAL DATA:  Provided history: Atelectasis of right lung. EXAM: PORTABLE CHEST 1 VIEW COMPARISON:  Prior chest radiographs 06/22/2022 and earlier. Chest CT 06/21/2022. FINDINGS: The cardiomediastinal silhouette is unchanged. Aortic atherosclerosis. Persistent hazy opacity at the right lung base which may reflect pleural fluid, atelectasis, pulmonary contusion and/or airspace consolidation. No appreciable airspace consolidation on the left. Persistent trace left pleural effusion. No evidence of pneumothorax. Known acute rib fractures, as described on the recent prior chest CT of 06/21/2022. IMPRESSION: 1. Persistent hazy opacity at the right lung base, which may reflect layering pleural fluid, atelectasis, pulmonary contusion and/or airspace consolidation. 2. Persistent trace left pleural effusion. 3. Aortic Atherosclerosis (ICD10-I70.0). Electronically Signed   By: Kellie Simmering D.O.   On: 06/23/2022 09:36        Scheduled Meds:  aspirin EC  81 mg Oral Daily   atorvastatin  80 mg Oral Daily   clopidogrel  75 mg Oral Daily   feeding supplement  237 mL Oral TID BM   fluticasone  1 spray Each Nare Daily   guaiFENesin  600 mg Oral BID   insulin aspart  0-5 Units Subcutaneous QHS   insulin aspart  0-9 Units Subcutaneous TID WC   lisinopril  20 mg Oral Daily   metoprolol tartrate  25 mg Oral BID   multivitamin with minerals  1 tablet Oral Daily   ondansetron (ZOFRAN) IV  4 mg Intravenous Once   pantoprazole  20 mg Oral Daily   sertraline  25 mg Oral Daily   Continuous Infusions:   LOS: 4 days     Sidney Ace, MD [facility]   If 7PM-7AM, please  contact night-coverage  06/24/2022, 2:07 PM

## 2022-06-24 NOTE — Consult Note (Signed)
Consultation Note Date: 06/24/2022   Patient Name: Raymond Sampson  DOB: Oct 15, 1940  MRN: [id number]  Age / Sex: 82 y.o., male  PCP: [name], MD Referring Physician: Sidney Ace, MD  Reason for Consultation: Establishing goals of care   HPI/Brief Hospital Course: 82 y.o. male  with past medical history of CAD s/p multiple stent placements with most recent stenting Jan. 2024 x2, carotid disease, CVA, T2DM, HLD, HTN admitted from home on 06/19/2022 after fall in shower. Reports slipped on soap sud and landed on his right side. Denies syncopal event precipitating fall. Denies head trauma or loss of consciousness. Reported right sided flank pain on admission.  ED imaging revealed: Multiple right rib fractures (8-12), intramuscular hematoma within right paraspinal and posterior flank musculature and small right hemathorax  Plavix held since admission due to hemathorax Required blood transfusion due to acute anemia General surgery consulted-no indication for surgical intervention  Evening of 3/22 c/o of chest pain, elevated troponin levels, concern for NSTEMI   Palliative medicine was consulted for assisting with goals of care conversations.  Subjective:  Extensive chart review has been completed prior to meeting patient including labs, vital signs, imaging, progress notes, orders, and available advanced directive documents from current and previous encounters.  Introduced myself as a Designer, jewellery as a member of the palliative care team. Explained palliative medicine is specialized medical care for people living with serious illness. It focuses on providing relief from the symptoms and stress of a serious illness. The goal is to improve quality of life for both the patient and the family.   Visited with [name] at his bedside. Awake and alert, apparent weakness and fatigue-easily drifts off to sleep during conversations but awakes  easily with re-engagement. Able to answer simple orientation questions appropriately. Productive cough with moderate sputum production. Mild right sided rib pain-relieved with repositioning during visit.  Wife-Raymond Sampson present during visit. Provides a brief life review. She and Mr. Nagy have been married for a little over 30 years. Together they do not have any children but together have 5 children from previous marriages. Multiple grandchildren. Worked for many years at [employer] before retiring. He and Raymond Sampson have 2 rescue dogs that bring them much joy. Faith is important to both and they rely on their faith in providing them with comfort. Decline chaplain visits as they prefer their personal pastor to visit as well as family members who are in the ministry.  Prior to admission, [name] remained fairly independent. Walked without assistance devices. Able to drive short distances. Independently performed ADL's.  [name] and [name] able to share their understanding of most recent medical conditions. Fall resulting in rib fractures with hemothorax, required blood transfusion, possible cardiac event overnight related to CP. Awaiting cardiology visit today after overnight events.  Has stood on side of bed once since admission but has not been able to work with PT/OT due to ongoing pain secondary to rib fractures.  Discussed expected disease trajectory of CAD with multiple stenting. Discussed expected weakness and deconditioning with prolonged hospitalization likely requiring extensive therapy sessions.  [name] has not completed advanced directive documentation in the past. Has a copy of booklet from last admission. Expressed interest in completing this admission but shares he has not thought much about end of life goals/decisions.  Approached goals of care conversations. Discussed code status-Full Code versus Do Not Resuscitate pathways were explained in detail.  Encouraged  patient/family to consider DNR/DNI status understanding evidenced based  poor outcomes in similar hospitalized patients, as the cause of the arrest is likely associated with chronic/terminal disease rather than a reversible acute cardio-pulmonary event. Wishes to remain Full Code at this time-requests more time to process and talk with family.  Raymond Sampson added daughter-in-law, Raymond Sampson to conversation via speaker phone. Raymond Sampson is a Therapist, sports at a local cardiology office. Verbalized understanding of conversations. Plan to visit [name] at the hospital tomorrow, planned to met at bedside for ongoing goals of care conversations.  I discussed importance of continued conversations with family/support persons and all members of their medical team regarding overall plan of care and treatment options ensuring decisions are in alignment with patients goals of care.  [name] and [facility] verbalize understanding of conversations. Encouraged them to reach out to PMT phone number provided for any questions or concerns that arise.  Emotional support provided to patient/family/support persons. PMT will continue to follow and support patient as needed.  Objective: Primary Diagnoses: Present on Admission:  Intramuscular hematoma  Hyperlipidemia  Essential hypertension  Diabetes mellitus type 2, controlled, with complications ([place])  Multiple rib fractures involving four or more ribs, right side  Coronary artery disease of native artery of native heart with stable angina pectoris [facility])   Physical Exam Constitutional:      General: He is not in acute distress.    Appearance: He is ill-appearing.  Pulmonary:     Effort: Pulmonary effort is normal. No respiratory distress.  Abdominal:     General: Abdomen is flat. There is no distension.  Skin:    General: Skin is warm and dry.  Neurological:     Mental Status: He is alert.     Motor: Weakness present.     Vital Signs: BP (!) 140/65 (BP Location: Right  Arm)   Pulse 83   Temp 99 F (37.2 C)   Resp 18   Ht 5\' 7"  (1.702 m)   Wt 69.6 kg   SpO2 96%   BMI 24.03 kg/m  Pain Scale: 0-10   Pain Score: 3   IO: Intake/output summary:  Intake/Output Summary (Last 24 hours) at 06/24/2022 1225 Last data filed at 06/24/2022 0900 Gross per 24 hour  Intake 557.83 ml  Output 800 ml  Net -242.17 ml    LBM: Last BM Date : 06/21/22 Baseline Weight: Weight: 69.6 kg Most recent weight: Weight: 69.6 kg       Palliative Assessment/Data: 40%   Assessment and Plan  SUMMARY OF RECOMMENDATIONS   Full Code-Full Scope Encourage ongoing [place] conversations Plan to meet with family at bedside 3/24 for further ongoing [place] conversations Recommend utilization of oxycodone PRN for ongoing pain management and to attempt PT/ambulation  Discussed With: Primary team   Thank you for this consult and allowing Palliative Medicine to participate in the care of Brick Soderberg. [place]. Palliative medicine will continue to follow and assist as needed.   Time Total: 75 minutes  Time spent includes: Detailed review of medical records (labs, imaging, vital signs), medically appropriate exam (mental status, respiratory, cardiac, skin), discussed with treatment team, counseling and educating patient, family and staff, documenting clinical information, medication management and coordination of care.   Signed by: Theodoro Grist, DNP, AGNP-C Palliative Medicine    Please contact Palliative Medicine Team phone at [phone] for questions and concerns.  For individual provider: See Shea Evans

## 2022-06-24 NOTE — TOC Progression Note (Signed)
Transition of Care [facility]) - Progression Note    Patient Details  Name: Raymond Sampson MRN: [id number] Date of Birth: February 06, 1941  [facility]) CM/SW Contact  Izola Price, RN Phone Number: 06/24/2022, 5:43 PM  Clinical Narrative: 06/24/22: 540 pm: All bed offers remain pending as of this time stamp. Simmie Davies RN CM        Expected Discharge Plan: Skilled Nursing Facility Barriers to Discharge: Continued Medical Work up  Expected Discharge Plan and Services     Post Acute Care Choice: [place] Living arrangements for the past 2 months: Single Family Home                                       Social Determinants of Health (SDOH) Interventions SDOH Screenings   Food Insecurity: No Food Insecurity (06/19/2022)  Housing: Low Risk  (06/19/2022)  Transportation Needs: No Transportation Needs (06/19/2022)  Utilities: Not At Risk (06/19/2022)  Depression (PHQ2-9): Low Risk  (05/17/2022)  Tobacco Use: Medium Risk (06/20/2022)    Readmission Risk Interventions     No data to display

## 2022-06-24 NOTE — Progress Notes (Signed)
Rounding Note    Patient Name: Raymond Sampson Date of Encounter: 06/24/2022  [place] Cardiologist: Raymond Rogue, MD   Subjective   Laying in bed, comfortable, reports right flank discomfort moderately improved Family at the bedside Concerned about his need for rehab placement  Inpatient Medications    Scheduled Meds:  aspirin EC  81 mg Oral Daily   atorvastatin  80 mg Oral Daily   clopidogrel  75 mg Oral Daily   feeding supplement  237 mL Oral TID BM   fluticasone  1 spray Each Nare Daily   guaiFENesin  600 mg Oral BID   insulin aspart  0-5 Units Subcutaneous QHS   insulin aspart  0-9 Units Subcutaneous TID WC   lisinopril  20 mg Oral Daily   metoprolol tartrate  25 mg Oral BID   multivitamin with minerals  1 tablet Oral Daily   ondansetron (ZOFRAN) IV  4 mg Intravenous Once   pantoprazole  20 mg Oral Daily   sertraline  25 mg Oral Daily   Continuous Infusions:   PRN Meds: acetaminophen, alum & mag hydroxide-simeth, morphine injection, nitroGLYCERIN, ondansetron **OR** ondansetron (ZOFRAN) IV, oxyCODONE, polyvinyl alcohol   Vital Signs    Vitals:   06/24/22 0500 06/24/22 0745 06/24/22 1149 06/24/22 1623  BP: (!) 109/95 (!) 148/66 (!) 140/65 (!) 141/66  Pulse: 87 85 83 76  Resp: 16 18 18 18   Temp: 99.6 F (37.6 C) 99 F (37.2 C) 99 F (37.2 C) 98.4 F (36.9 C)  TempSrc:      SpO2: 92% 92% 96% 94%  Weight:      Height:        Intake/Output Summary (Last 24 hours) at 06/24/2022 1654 Last data filed at 06/24/2022 1425 Gross per 24 hour  Intake 797.83 ml  Output 1150 ml  Net -352.17 ml      06/19/2022    9:24 PM 05/17/2022    3:38 PM 05/10/2022    1:45 PM  Last 3 Weights  Weight (lbs) 153 lb 7 oz 152 lb 152 lb 12.8 oz  Weight (kg) 69.6 kg 68.947 kg 69.31 kg      Telemetry     - Personally Reviewed  ECG     - Personally Reviewed  Physical Exam   Constitutional:  oriented to person, place, and time. No distress.  HENT:   Head: Grossly normal Eyes:  no discharge. No scleral icterus.  Neck: No JVD, no carotid bruits  Cardiovascular: Regular rate and rhythm, no murmurs appreciated Pulmonary/Chest: Clear to auscultation bilaterally, no wheezes or rails Abdominal: Soft.  no distension.  no tenderness.  Musculoskeletal: Normal range of motion Neurological:  normal muscle tone. Coordination normal. No atrophy Skin: Skin warm and dry, large region ecchymotic bruising right flank, right hip Psychiatric: normal affect, pleasant  Labs    High Sensitivity Troponin:   Recent Labs  Lab 06/23/22 1826 06/23/22 2054 06/23/22 2219 06/24/22 0115 06/24/22 0348  TROPONINIHS 2,653* 3,010* 3,781* 4,936* 3,814*     Chemistry Recent Labs  Lab 06/19/22 1145 06/20/22 0425 06/22/22 1525 06/23/22 1428 06/24/22 0115  NA 135 134* 133* 136 134*  K 4.9 4.3 4.1 4.0 4.1  CL 97* 98 104 104 100  CO2 25 24 23 24 24   GLUCOSE 231* 171* 219* 182* 190*  BUN 21 25* 31* 38* 36*  CREATININE 1.29* 1.17 1.01 1.02 0.86  CALCIUM 8.8* 8.7* 8.1* 8.1* 8.1*  MG  --   --   --  2.3 2.1  PROT 6.7 6.5  --   --   --   ALBUMIN 3.4* 3.4*  --   --   --   AST 39 31  --   --   --   ALT 20 21  --   --   --   ALKPHOS 73 62  --   --   --   BILITOT 0.7 0.9  --   --   --   GFRNONAA 56* >60 >60 >60 >60  ANIONGAP 13 12 6 8 10     Lipids No results for input(s): "CHOL", "TRIG", "HDL", "LABVLDL", "LDLCALC", "CHOLHDL" in the last 168 hours.  Hematology Recent Labs  Lab 06/22/22 1525 06/23/22 0719 06/24/22 0115  WBC 13.8* 14.5* 12.5*  RBC 2.72* 2.46* 2.95*  HGB 8.3* 7.4* 8.8*  HCT 25.0* 22.9* 27.0*  MCV 91.9 93.1 91.5  MCH 30.5 30.1 29.8  MCHC 33.2 32.3 32.6  RDW 13.8 14.0 13.9  PLT 269 256 261   Thyroid No results for input(s): "TSH", "FREET4" in the last 168 hours.  BNPNo results for input(s): "BNP", "PROBNP" in the last 168 hours.  DDimer No results for input(s): "DDIMER" in the last 168 hours.   Radiology    DG Chest Port 1  View  Result Date: 06/23/2022 CLINICAL DATA:  Chest pain, NSTEMI EXAM: PORTABLE CHEST 1 VIEW COMPARISON:  Portable exam 1759 hours compared to 0730 hours FINDINGS: Normal heart size, mediastinal contours, and pulmonary vascularity. Coronary stents noted. Small RIGHT pleural effusion and mild bibasilar atelectasis versus infiltrate. Upper lungs clear. No pneumothorax. Mildly displaced fractures of the posterior RIGHT tenth and eleventh ribs. IMPRESSION: Mild bibasilar infiltrate versus atelectasis and minimal RIGHT pleural effusion. Electronically Signed   By: Raymond Sampson M.D.   On: 06/23/2022 18:07   DG Chest Port 1 View  Result Date: 06/23/2022 CLINICAL DATA:  Provided history: Atelectasis of right lung. EXAM: PORTABLE CHEST 1 VIEW COMPARISON:  Prior chest radiographs 06/22/2022 and earlier. Chest CT 06/21/2022. FINDINGS: The cardiomediastinal silhouette is unchanged. Aortic atherosclerosis. Persistent hazy opacity at the right lung base which may reflect pleural fluid, atelectasis, pulmonary contusion and/or airspace consolidation. No appreciable airspace consolidation on the left. Persistent trace left pleural effusion. No evidence of pneumothorax. Known acute rib fractures, as described on the recent prior chest CT of 06/21/2022. IMPRESSION: 1. Persistent hazy opacity at the right lung base, which may reflect layering pleural fluid, atelectasis, pulmonary contusion and/or airspace consolidation. 2. Persistent trace left pleural effusion. 3. Aortic Atherosclerosis (ICD10-I70.0). Electronically Signed   By: Raymond Sampson D.O.   On: 06/23/2022 09:36    Cardiac Studies   Cardiac catheterization May 02, 2022 Patent LAD stents with stable mild in-stent restenosis of remotely placed mid-distal LAD stents, unchanged from completion of last catheterization.  Recently placed proximal LAD stent is widely patent. Severe LCx/OM2 disease, similar to prior catheterizations. Mildly elevated left ventricular  filling pressure. Successful complex bifurcation PCI to LCx/OM2 using Culotte technique (Onyx frontier 2.75 x 30 mm main branch and 2.5 x 15 mm side branch drug-eluting stents) with 0% residual stenosis and TIMI-3 flow.  Patient Profile     Mr. Raymond Sampson is a 82 YO male with history of PCI in 2007 and NSTEMI in January 2024 status post PCI to the LAD and RCA followed by staged PCI to the left circumflex, bilateral carotid artery disease, dementia, type 2 diabetes, hypertension, prior tobacco use that is currently admitted after a mechanical fall leading to multiple traumatic  posterior rib fractures, right-sided hemothorax, intramuscular hematoma and anemia.   Assessment & Plan    .1. Mechanical fall with traumatic multiple rib fractures, right-sided hemothorax, and intramuscular hematoma - mechanical fall, slipping in the bathtub  ---multiple stents end of January 2024 on aspirin 81 mg daily Plavix restarted yesterday, hemoglobin stable 8.8 after 1 unit  2.  Multivessel CAD involving native coronary arteries without angina: -No symptoms concerning for angina or cardiac decompensation On aspirin Plavix   3.  Atrial tachycardia: Well-controlled on metoprolol   4.  HTN: Continue lisinopril, metoprolol   5.  History of TIA/CVA: -Aspirin and atorvastatin    6.  Symptomatic anemia with progressive right hemothorax and intramuscular hematoma: -Baseline hemoglobin around 11-12 -Hemoglobin 8.8  [facility] will sign off.   Medication Recommendations: No changes Other recommendations (labs, testing, etc): No further testing apart from periodic CBC Follow up as an outpatient: Outpatient follow-up with cardiology  Long discussion with family at the bedside, wife on the phone, long discussion concerning recent events, recovery, likely need for rehab, placement options  Total encounter time more than 50 minutes  Greater than 50% was spent in counseling and coordination of care with  the patient     For questions or updates, please contact [place] Please consult [email or web address] for contact info under        Signed, Raymond Rogue, MD  06/24/2022, 4:54 PM

## 2022-06-25 ENCOUNTER — Inpatient Hospital Stay: Payer: PPO

## 2022-06-25 DIAGNOSIS — Z515 Encounter for palliative care: Secondary | ICD-10-CM | POA: Diagnosis not present

## 2022-06-25 DIAGNOSIS — T148XXA Other injury of unspecified body region, initial encounter: Secondary | ICD-10-CM | POA: Diagnosis not present

## 2022-06-25 DIAGNOSIS — Z7189 Other specified counseling: Secondary | ICD-10-CM | POA: Diagnosis not present

## 2022-06-25 DIAGNOSIS — S2241XA Multiple fractures of ribs, right side, initial encounter for closed fracture: Secondary | ICD-10-CM | POA: Diagnosis not present

## 2022-06-25 LAB — CBC
HCT: 28.5 % — ABNORMAL LOW (ref 39.0–52.0)
Hemoglobin: 9.2 g/dL — ABNORMAL LOW (ref 13.0–17.0)
MCH: 30 pg (ref 26.0–34.0)
MCHC: 32.3 g/dL (ref 30.0–36.0)
MCV: 92.8 fL (ref 80.0–100.0)
Platelets: 308 10*3/uL (ref 150–400)
RBC: 3.07 MIL/uL — ABNORMAL LOW (ref 4.22–5.81)
RDW: 13.8 % (ref 11.5–15.5)
WBC: 14.7 10*3/uL — ABNORMAL HIGH (ref 4.0–10.5)
nRBC: 0 % (ref 0.0–0.2)

## 2022-06-25 LAB — URINALYSIS, COMPLETE (UACMP) WITH MICROSCOPIC
Bacteria, UA: NONE SEEN
Bilirubin Urine: NEGATIVE
Glucose, UA: 50 mg/dL — AB
Hgb urine dipstick: NEGATIVE
Ketones, ur: NEGATIVE mg/dL
Leukocytes,Ua: NEGATIVE
Nitrite: NEGATIVE
Protein, ur: 100 mg/dL — AB
Specific Gravity, Urine: 1.023 (ref 1.005–1.030)
Squamous Epithelial / HPF: NONE SEEN /HPF (ref 0–5)
WBC, UA: NONE SEEN WBC/hpf (ref 0–5)
pH: 5 (ref 5.0–8.0)

## 2022-06-25 LAB — GLUCOSE, CAPILLARY
Glucose-Capillary: 173 mg/dL — ABNORMAL HIGH (ref 70–99)
Glucose-Capillary: 188 mg/dL — ABNORMAL HIGH (ref 70–99)
Glucose-Capillary: 167 mg/dL — ABNORMAL HIGH (ref 70–99)
Glucose-Capillary: 213 mg/dL — ABNORMAL HIGH (ref 70–99)

## 2022-06-25 MED ORDER — LIDOCAINE 5 % EX PTCH
1.0000 | MEDICATED_PATCH | CUTANEOUS | Status: DC
  Administered 2022-06-27 – 2022-07-03 (×7): 1 via TRANSDERMAL
  Filled 2022-06-25 (×9): qty 1

## 2022-06-25 NOTE — Progress Notes (Signed)
Physical Therapy Treatment Patient Details Name: Raymond Sampson MRN: [id number] DOB: 1940/07/21 Today's Date: 06/25/2022   History of Present Illness Raymond Sampson is an 7yoM who comes [facility]22 after a fall in shower at home, hit Rt flank, orthostatic upon arrival to ED 128/66 sitting to 94/60 standing. PMH: CAD, CVA, DM, HTN. Chest CT shows 'large intramuscular hematoma' with the Rt paraspinals. ABD CT revealing fracture of Rt ribs 8-12, (9-12 are displaced), fracture of T11 Rt transverse process, and 'small right hemothorax with adjacent atelectasis or contusion in the right lower lobe.'    PT Comments    Pt more alert upon entering room, but continued to have difficulty keeping eyes open.  Pt received in Semi-Fowler's position and agreeable to therapy.  Pt performed bed-level exercises at times neeing tactile feedback to remain awake.  Some exercises required AAROM towards the end due to weakness noted in the LE's.  Pt sat at EOB for several minutes attempting to challenge sitting balance and allow for core stabilization.  Pt then needed to move up in bed for positioning, so pt utilized the RW and +2 modA each to help pt partially stand x3 attempts and moving buttocks towards the [facility].  Pt left with family in room and call bell within reach.  Nursing notified of mobility status.     Recommendations for follow up therapy are one component of a multi-disciplinary discharge planning process, led by the attending physician.  Recommendations may be updated based on patient status, additional functional criteria and insurance authorization.  Follow Up Recommendations  Skilled nursing-short term rehab (<3 hours/day) Can patient physically be transported by private vehicle: No   Assistance Recommended at Discharge Intermittent Supervision/Assistance  Patient can return home with the following Two people to help with walking and/or transfers;Two people to help with  bathing/dressing/bathroom;Assistance with cooking/housework;Help with stairs or ramp for entrance;Assist for transportation;Direct supervision/assist for medications management   Equipment Recommendations  None recommended by PT    Recommendations for Other Services       Precautions / Restrictions Precautions Precautions: Fall Precaution Comments: rib fx Restrictions Weight Bearing Restrictions: No     Mobility  Bed Mobility Overal bed mobility: Needs Assistance Bed Mobility: Supine to Sit, Sit to Supine     Supine to sit: Max assist Sit to supine: Total assist   General bed mobility comments: Pt able to maintain balance at EBO more today and required intermittent assistance, but utilized UE's for support while feet were supported.    Transfers Overall transfer level: Needs assistance Equipment used: Rollator (4 wheels) Transfers: Sit to/from Stand, Bed to chair/wheelchair/BSC Sit to Stand: Mod assist, +2 physical assistance, +2 safety/equipment, From elevated surface          Lateral/Scoot Transfers: Total assist General transfer comment: Pt still with increased pain, but was able to perform STS x3 and able to scoot towards [place].    Ambulation/Gait               General Gait Details: deferred due to balance deficits and weakness in LE's.   Stairs             Wheelchair Mobility    Modified Rankin (Stroke Patients Only)       Balance Overall balance assessment: Needs assistance Sitting-balance support: Feet supported, Bilateral upper extremity supported Sitting balance-Leahy Scale: Fair     Standing balance support: Bilateral upper extremity supported, Reliant on assistive device for balance Standing balance-Leahy Scale: Poor  Cognition Arousal/Alertness: Lethargic Behavior During Therapy: WFL for tasks assessed/performed Overall Cognitive Status: History of cognitive impairments - at baseline                                  General Comments: pt with increased lethargy and inability to keep his eyes open throughout most of the session.        Exercises Total Joint Exercises Ankle Circles/Pumps: AROM, Strengthening, Both, 10 reps, Supine Quad Sets: AROM, Strengthening, Both, 10 reps, Supine Heel Slides: AROM, Strengthening, Both, 10 reps, Supine Hip ABduction/ADduction: AROM, Strengthening, Both, 10 reps, Supine Straight Leg Raises: AROM, Strengthening, Both, 10 reps, Supine    General Comments        Pertinent Vitals/Pain Pain Assessment Pain Assessment: Faces Faces Pain Scale: Hurts little more Pain Location: R flank Pain Intervention(s): Limited activity within patient's tolerance    Home Living                          Prior Function            PT Goals (current goals can now be found in the care plan section) Acute Rehab PT Goals Patient Stated Goal: return to mobility PT Goal Formulation: With patient Time For Goal Achievement: 07/04/22 Potential to Achieve Goals: Fair Progress towards PT goals: Progressing toward goals    Frequency    Min 3X/week      PT Plan Current plan remains appropriate    Co-evaluation              AM-PAC PT "6 Clicks" Mobility   Outcome Measure  Help needed turning from your back to your side while in a flat bed without using bedrails?: A Lot Help needed moving from lying on your back to sitting on the side of a flat bed without using bedrails?: A Lot Help needed moving to and from a bed to a chair (including a wheelchair)?: Total Help needed standing up from a chair using your arms (e.g., wheelchair or bedside chair)?: A Lot Help needed to walk in hospital room?: Total Help needed climbing 3-5 steps with a railing? : Total 6 Click Score: 9    End of Session Equipment Utilized During Treatment: Oxygen Activity Tolerance: Patient limited by pain;Other (comment) Patient left: in bed;with  call bell/phone within reach;with bed alarm set;with family/visitor present Nurse Communication: Mobility status PT Visit Diagnosis: Unsteadiness on feet (R26.81);Other abnormalities of gait and mobility (R26.89);Repeated falls (R29.6);Muscle weakness (generalized) (M62.81)     Time: [id number] PT Time Calculation (min) (ACUTE ONLY): 33 min  Charges:  $Therapeutic Exercise: 8-22 mins $Therapeutic Activity: 8-22 mins                     Gwenlyn Saran, PT, DPT Physical Therapist - [facility]  06/25/22, 4:50 PM

## 2022-06-25 NOTE — Progress Notes (Signed)
PT Cancellation Note  Patient Details Name: Raymond Sampson MRN: [id number] DOB: 1940/06/28   Cancelled Treatment:    Reason Eval/Treat Not Completed: Other (comment).  Chart reviewed and attempted to see pt.  Pt had not received pain medication due to lethargy at this point in time.  Spoke with family member and she requested to hold off on therapy due to pt being comfortable and it being painful with pt rolling to get sponge bath this morning.  Will re-attempt at later time as medically appropriate.     Gwenlyn Saran, PT, DPT Physical Therapist - [facility]  06/25/22, 10:16 AM

## 2022-06-25 NOTE — Progress Notes (Signed)
PROGRESS NOTE    Raymond Sampson  [id number] DOB: [birth year] DOA: 06/19/2022 PCP: [name], MD    Brief Narrative:  Raymond Sampson is a 82 y.o. male with medical history significant of coronary artery disease status post multiple stenting's with most recent stenting January 2024 x 2, carotid disease, CVA, type 2 diabetes, dementia, hyperlipidemia, hypertension, who presented to the hospital after a fall at home.  Reportedly, he slipped on soap in the tub and landed on his right side.   Workup revealed multiple right rib fractures (8 to 12), intramuscular hematoma within the right paraspinal and posterior flank musculature and small right hemothorax.  Plavix was held given 9 acute bleed.  General surgery consulted.  No surgical intervention indicated for hemothorax.  Stable appearance on imaging.  Cardiology to restart Plavix 3/22.  3/23: Patient transferred to progressive after high-sensitivity troponin 3000+ after reports of chest pain.  EKG reviewed, nonischemic.  Patient asymptomatic other than that and chest pain resolved.  Troponins trended, peaked at approximately 5000.  3/24: Palliative care met with patient and family at bedside.  At this time patient remains full scope of care.   Assessment & Plan:   Principal Problem:   Intramuscular hematoma Active Problems:   Rib fractures   Status post coronary artery stent placement   Hyperlipidemia   Essential hypertension   Coronary artery disease of native artery of native heart with stable angina pectoris (HCC)   Diabetes mellitus type 2, controlled, with complications (HCC)   Hemothorax on right   Leukocytosis   Multiple rib fractures involving four or more ribs, right side   Acute blood loss anemia   Fall   Malnutrition of moderate degree  Multiple right rib fractures (8th through 12th), right T11 transverse process fracture, intramuscular hematoma within the right paraspinal and posterior flank musculature, and   right hemothorax, s/p fall: CT chest without contrast (done on 06/21/2022) showed small to moderate size right pneumothorax (increased from prior study), near complete right lower lobe atelectasis/consolidation new dependent opacities in the left lower lobe and lingula which may present atelectasis or developing pneumonia.  Clinically, doubt pneumonia at this time.  He was evaluated by Dr. Sima Matas, general surgeon.  Conservative management recommended at this time.  General surgery has signed off as of 3/22 As needed pain control.  Stress incentive spirometry use.  Mobility as tolerated.  Wean oxygen as tolerated.  Attempted minimize sedating pain medication.  Lidoderm patch added.  Elevated troponin Noted on 3/22 after complaints of chest pain.  At this point EKG with ischemic appearing.  I suspect supply demand ischemia.  Patient likely not a good candidate for any kind of ischemic evaluation given multiple rib fractures, hemothorax, high risk for bleeding.  Cardiology signed off.  No further inpatient cardiac diagnostics  Acute blood loss anemia: Hemoglobin was 9.8 on admission.  Has dropped to 7.4.  Patient remains hemodynamically stable.  Stable appearance of hemothorax.  Cardiology to restart Plavix 3/22.  Transfused 1 unit PRBC on 3/22.  Hemoglobin stable.  No evidence of acute blood loss.    Acute hypoxic respiratory failure: He is on 3 L/min oxygen via [place].  Wean off oxygen as able.  Stress incentive spirometry use.  Transfusion as above.     CAD s/p coronary stent in January 2024, history of stroke: Continue aspirin.  Plavix has been held because of hemothorax.  Plavix restarted 3/22.  Patient had some chest pain at night.  Troponins trended.  Type II DM with hyperglycemia: NovoLog as needed.  Metformin and Januvia have been held.   Dementia: Continue supportive care  DVT prophylaxis: SCD Code Status: Full Family Communication: Family member at bedside 3/22, spouse at bedside  3/23, 3/24 Disposition Plan: Status is: Inpatient Remains inpatient appropriate because: Acute respiratory failure.  Acute blood loss anemia.  Multiple rib fractures requiring pain control.   Level of care: Progressive  Consultants:  Cardiology General surgery, signed off  Procedures:  None  Antimicrobials: None   Subjective: Seen and examined.  Patient sleepy/lethargic.  Slightly improved from yesterday.  Spouse at bedside.  Objective: Vitals:   06/25/22 0120 06/25/22 0533 06/25/22 0732 06/25/22 1210  BP: (!) 175/83  (!) 159/74 133/63  Pulse: 99 95 82 76  Resp: 20   17  Temp: 99.4 F (37.4 C)  98.4 F (36.9 C)   TempSrc:   Axillary   SpO2: 93% 91% 98%   Weight:      Height:        Intake/Output Summary (Last 24 hours) at 06/25/2022 1424 Last data filed at 06/25/2022 0536 Gross per 24 hour  Intake 240 ml  Output 1050 ml  Net -810 ml   Filed Weights   06/19/22 2124  Weight: 69.6 kg    Examination:  General exam: Sedated.  Chronically ill-appearing. Respiratory system: Bibasilar crackles.  Normal work of breathing.  3 L Cardiovascular system: S1-S2, RRR, no murmurs, no pedal edema Gastrointestinal system: Soft,/ND, normal bowel sounds Central nervous system: Lethargic but arousable.  Oriented.  No focal deficits Extremities: Unable to assess power Skin: No rashes, lesions or ulcers Psychiatry: Judgement and insight appear impaired. Mood & affect flattened.     Data Reviewed: I have personally reviewed following labs and imaging studies  CBC: Recent Labs  Lab 06/21/22 0510 06/21/22 2148 06/22/22 0704 06/22/22 1525 06/23/22 0719 06/24/22 0115 06/25/22 0354  WBC 10.5   < > 10.7* 13.8* 14.5* 12.5* 14.7*  NEUTROABS 8.1*  --   --  11.8*  --   --   --   HGB 6.7*   < > 8.0* 8.3* 7.4* 8.8* 9.2*  HCT 20.7*   < > 24.5* 25.0* 22.9* 27.0* 28.5*  MCV 93.2   < > 92.1 91.9 93.1 91.5 92.8  PLT 235   < > 232 269 256 261 308   < > = values in this interval not  displayed.   Basic Metabolic Panel: Recent Labs  Lab 06/19/22 1145 06/20/22 0425 06/22/22 1525 06/23/22 1428 06/24/22 0115  NA 135 134* 133* 136 134*  K 4.9 4.3 4.1 4.0 4.1  CL 97* 98 104 104 100  CO2 25 24 23 24 24   GLUCOSE 231* 171* 219* 182* 190*  BUN 21 25* 31* 38* 36*  CREATININE 1.29* 1.17 1.01 1.02 0.86  CALCIUM 8.8* 8.7* 8.1* 8.1* 8.1*  MG  --   --   --  2.3 2.1   GFR: Estimated Creatinine Clearance: 63 mL/min (by C-G formula based on SCr of 0.86 mg/dL). Liver Function Tests: Recent Labs  Lab 06/19/22 1145 06/20/22 0425  AST 39 31  ALT 20 21  ALKPHOS 73 62  BILITOT 0.7 0.9  PROT 6.7 6.5  ALBUMIN 3.4* 3.4*   No results for input(s): "LIPASE", "AMYLASE" in the last 168 hours. No results for input(s): "AMMONIA" in the last 168 hours. Coagulation Profile: No results for input(s): "INR", "PROTIME" in the last 168 hours. Cardiac Enzymes: No results for input(s): "CKTOTAL", "CKMB", "CKMBINDEX", "  TROPONINI" in the last 168 hours. BNP (last 3 results) No results for input(s): "PROBNP" in the last 8760 hours. HbA1C: No results for input(s): "HGBA1C" in the last 72 hours. CBG: Recent Labs  Lab 06/24/22 1146 06/24/22 1621 06/24/22 2058 06/25/22 0833 06/25/22 1233  GLUCAP 254* 195* 179* 173* 188*   Lipid Profile: No results for input(s): "CHOL", "HDL", "LDLCALC", "TRIG", "CHOLHDL", "LDLDIRECT" in the last 72 hours. Thyroid Function Tests: No results for input(s): "TSH", "T4TOTAL", "FREET4", "T3FREE", "THYROIDAB" in the last 72 hours. Anemia Panel: No results for input(s): "VITAMINB12", "FOLATE", "FERRITIN", "TIBC", "IRON", "RETICCTPCT" in the last 72 hours. Sepsis Labs: No results for input(s): "PROCALCITON", "LATICACIDVEN" in the last 168 hours.  No results found for this or any previous visit (from the past 240 hour(s)).       Radiology Studies: DG Chest Port 1 View  Result Date: 06/25/2022 CLINICAL DATA:  Hypoxia.  Right rib fracture. EXAM:  PORTABLE CHEST 1 VIEW COMPARISON:  06/23/2022 and chest CT 06/21/2022 FINDINGS: Again noted are displaced right lower rib fractures. Negative for pneumothorax. Persistent hazy densities in the right lower chest that are poorly characterized due to overlying support apparatuses. However, the densities at the right lung base may have increased since 06/23/2022. Concern for a small left pleural effusion. Heart size is stable. Partial visualization of the cervical surgical plate. IMPRESSION: 1. Increased densities in the right lower chest. Findings concerning for increased atelectasis and/or pleural fluid. This area is poorly characterized due to overlying support apparatuses. 2. Concern for small left pleural effusion. 3. Displaced right rib fractures without pneumothorax. Electronically Signed   By: Markus Daft M.D.   On: 06/25/2022 09:15   DG Chest Port 1 View  Result Date: 06/23/2022 CLINICAL DATA:  Chest pain, NSTEMI EXAM: PORTABLE CHEST 1 VIEW COMPARISON:  Portable exam 1759 hours compared to 0730 hours FINDINGS: Normal heart size, mediastinal contours, and pulmonary vascularity. Coronary stents noted. Small RIGHT pleural effusion and mild bibasilar atelectasis versus infiltrate. Upper lungs clear. No pneumothorax. Mildly displaced fractures of the posterior RIGHT tenth and eleventh ribs. IMPRESSION: Mild bibasilar infiltrate versus atelectasis and minimal RIGHT pleural effusion. Electronically Signed   By: Lavonia Dana M.D.   On: 06/23/2022 18:07        Scheduled Meds:  aspirin EC  81 mg Oral Daily   atorvastatin  80 mg Oral Daily   clopidogrel  75 mg Oral Daily   feeding supplement  237 mL Oral TID BM   fluticasone  1 spray Each Nare Daily   guaiFENesin  600 mg Oral BID   insulin aspart  0-5 Units Subcutaneous QHS   insulin aspart  0-9 Units Subcutaneous TID WC   lidocaine  1 patch Transdermal Q24H   lisinopril  20 mg Oral Daily   metoprolol tartrate  25 mg Oral BID   multivitamin with  minerals  1 tablet Oral Daily   ondansetron (ZOFRAN) IV  4 mg Intravenous Once   pantoprazole  20 mg Oral Daily   sertraline  25 mg Oral Daily   Continuous Infusions:   LOS: 5 days     Sidney Ace, MD [facility]   If 7PM-7AM, please contact night-coverage  06/25/2022, 2:24 PM

## 2022-06-25 NOTE — Progress Notes (Signed)
Daily Progress Note   Patient Name: Raymond Sampson       Date: 06/25/2022 DOB: 24-Sep-1940  Age: 82 y.o. MRN#: [id number] Attending Physician: Sidney Ace, MD Primary Care Physician: [name], MD Admit Date: 06/19/2022  Reason for Consultation/Follow-up: Establishing goals of care  HPI/Brief Hospital Review: 82 y.o. male with past medical history of CAD s/p multiple stent placements with most recent stenting Jan. 2024 x2, carotid disease, CVA, T2DM, HLD, HTN admitted from home on 06/19/2022 after fall in shower. Reports slipped on soap suds and landed on his right side. Denies syncopal event precipitating fall. Denies head trauma or loss of consciousness. Reported right sided flank pain on admission.   Found to have multiple right sided rib fractures (8-12), intramuscular hematoma within right paraspinal and posterior flank musculature and small right hemothorax.  Required blood transfusion this admission due to acute anemia.  Palliative Medicine consulted for assisting with goals of care conversations.  Subjective: Extensive chart review has been completed prior to meeting patient including labs, vital signs, imaging, progress notes, orders, and available advanced directive documents from current and previous encounters.    Visited with Raymond Sampson at his bedside. On initial visit, wife-[name] present at bedside. Reports uneventful evening with Raymond Sampson able to rest. On initial exam, Raymond Sampson resting with eyes closed, does not acknowledge me being in room. [name] requested I return later in the morning when daughter-in-law/Kimberly visiting.  Later returned to visit with Raymond Sampson, wife-[name] present as well as Kimberly-DIL and her family. Addressed all questions and  concerns. Family present up to date on most recent medical conditions. [name] shared Raymond Sampson had a bed bath this morning that caused discomfort and fatigue. PT attempted to work with Raymond Sampson but he declined at that time due to fatigue-plan to work with them later this afternoon.  We discussed pain management options. [name] concerned with increased pain medication contributing to increased drowsiness. We discussed use of topical lidocaine patch for pain management. [name] discusses her concerns related to cost-recently received and reviewed EOB from hospital stay. Unable to provide her information on cost of lidocaine patch. [name] and Mr. Klaphake do agree lidocaine patch may be beneficial in treating pain related to rib fractures. Pan to attempt to work with PT later this afternoon-if unable due to  increased pain or if PT causes increased discomfort they would like to then proceed with lidocaine patch.  We briefly discussed goals of care-Code Status. [name] shares she is meeting resistance from a few family members regarding Code Status. At this time, they request more time to have ongoing conversations with family regarding GOC-Code Status. Offered and encouraged them to reach out when other family arrives to assist with these conversations and to address all questions and concerns. [name] appreciative of the support.  Encouraged ongoing participation with PT-plan to d/c to SNF.  Shared this is my last day on service until next weekend. Will plan to have provider from PMT service visit mid week if Raymond Sampson remains in hospital to check in for needs.  Objective:  Physical Exam Constitutional:      General: He is not in acute distress.    Appearance: He is ill-appearing.  Pulmonary:     Effort: Pulmonary effort is normal. No respiratory distress.  Skin:    General: Skin is warm and dry.  Neurological:     Mental Status: He is alert.     Motor: Weakness present.             Vital Signs:  BP (!) 159/74 (BP Location: Right Arm)   Pulse 82   Temp 98.4 F (36.9 C) (Axillary)   Resp 20   Ht 5\' 7"  (1.702 m)   Wt 69.6 kg   SpO2 98%   BMI 24.03 kg/m  SpO2: SpO2: 98 % O2 Device: O2 Device: Nasal Cannula O2 Flow Rate: O2 Flow Rate (L/min): 3 L/min   Palliative Care Assessment & Plan   Assessment/Recommendation/Plan  Full Code-Full Scope Encouraged ongoing [place] conversations with family and to reach out to PMT for assistance and support Recommend Lidocaine patch to right ribs for ongoing pain-family still deciding on this at this time PMT to continue to follow for ongoing needs and support  Care plan was discussed with primary team.  Thank you for allowing the Palliative Medicine Team to assist in the care of this patient.  Total time:  35 minutes  Time spent includes: Detailed review of medical records (labs, imaging, vital signs), medically appropriate exam (mental status, respiratory, cardiac, skin), discussed with treatment team, counseling and educating patient, family and staff, documenting clinical information, medication management and coordination of care.  Theodoro Grist, DNP, AGNP-C Palliative Medicine   Please contact Palliative Medicine Team phone at [phone] for questions and concerns.

## 2022-06-26 DIAGNOSIS — T148XXA Other injury of unspecified body region, initial encounter: Secondary | ICD-10-CM | POA: Diagnosis not present

## 2022-06-26 LAB — CBC
HCT: 31.5 % — ABNORMAL LOW (ref 39.0–52.0)
Hemoglobin: 10.1 g/dL — ABNORMAL LOW (ref 13.0–17.0)
MCH: 29 pg (ref 26.0–34.0)
MCHC: 32.1 g/dL (ref 30.0–36.0)
MCV: 90.5 fL (ref 80.0–100.0)
Platelets: 382 10*3/uL (ref 150–400)
RBC: 3.48 MIL/uL — ABNORMAL LOW (ref 4.22–5.81)
RDW: 13.7 % (ref 11.5–15.5)
WBC: 15.4 10*3/uL — ABNORMAL HIGH (ref 4.0–10.5)
nRBC: 0 % (ref 0.0–0.2)

## 2022-06-26 LAB — GLUCOSE, CAPILLARY
Glucose-Capillary: 239 mg/dL — ABNORMAL HIGH (ref 70–99)
Glucose-Capillary: 236 mg/dL — ABNORMAL HIGH (ref 70–99)
Glucose-Capillary: 227 mg/dL — ABNORMAL HIGH (ref 70–99)
Glucose-Capillary: 245 mg/dL — ABNORMAL HIGH (ref 70–99)

## 2022-06-26 LAB — PROCALCITONIN: Procalcitonin: 0.24 ng/mL

## 2022-06-26 MED ORDER — LEVOFLOXACIN 500 MG PO TABS
750.0000 mg | ORAL_TABLET | Freq: Every day | ORAL | Status: AC
  Administered 2022-06-26 – 2022-06-30 (×5): 750 mg via ORAL
  Filled 2022-06-26 (×5): qty 2

## 2022-06-26 NOTE — Care Management Important Message (Signed)
Important Message  Patient Details  Name: KEEVEN SKEETERS MRN: [id number] Date of Birth: 02-Apr-1941   Medicare Important Message Given:  Yes     Dannette Barbara 06/26/2022, 12:14 PM

## 2022-06-26 NOTE — Progress Notes (Signed)
PROGRESS NOTE    Raymond Sampson  [id number] DOB: 1941/03/31 DOA: 06/19/2022 PCP: [name], MD    Brief Narrative:  Raymond Sampson is a 82 y.o. male with medical history significant of coronary artery disease status post multiple stenting's with most recent stenting January 2024 x 2, carotid disease, CVA, type 2 diabetes, dementia, hyperlipidemia, hypertension, who presented to the hospital after a fall at home.  Reportedly, he slipped on soap in the tub and landed on his right side.   Workup revealed multiple right rib fractures (8 to 12), intramuscular hematoma within the right paraspinal and posterior flank musculature and small right hemothorax.  Plavix was held given 9 acute bleed.  General surgery consulted.  No surgical intervention indicated for hemothorax.  Stable appearance on imaging.  Cardiology to restart Plavix 3/22.  3/23: Patient transferred to progressive after high-sensitivity troponin 3000+ after reports of chest pain.  EKG reviewed, nonischemic.  Patient asymptomatic other than that and chest pain resolved.  Troponins trended, peaked at approximately 5000.  3/24: Palliative care met with patient and family at bedside.  At this time patient remains full scope of care.   Assessment & Plan:   Principal Problem:   Intramuscular hematoma Active Problems:   Rib fractures   Status post coronary artery stent placement   Hyperlipidemia   Essential hypertension   Coronary artery disease of native artery of native heart with stable angina pectoris (HCC)   Diabetes mellitus type 2, controlled, with complications (HCC)   Hemothorax on right   Leukocytosis   Multiple rib fractures involving four or more ribs, right side   Acute blood loss anemia   Fall   Malnutrition of moderate degree  Multiple right rib fractures (8th through 12th), right T11 transverse process fracture, intramuscular hematoma within the right paraspinal and posterior flank musculature, and   right hemothorax, s/p fall: CT chest without contrast (done on 06/21/2022) showed small to moderate size right pneumothorax (increased from prior study), near complete right lower lobe atelectasis/consolidation new dependent opacities in the left lower lobe and lingula which may present atelectasis or developing pneumonia.  Clinically, doubt pneumonia at this time.  He was evaluated by Dr. Sima Matas, general surgeon.  Conservative management recommended at this time.  General surgery has signed off as of 3/22 Plan: As needed pain control.  Stress incentive spirometry use.  Mobility as tolerated.  Wean oxygen as tolerated.  Attempted minimize sedating pain medication.  Lidoderm patch added.  Leukocytosis Uptrending leukocytosis noted over the past 2 days.  On 3/25 leukocytosis up to 15.  Unclear etiology.  Patient not febrile.  Urinalysis clear.  Possible hazy opacities on chest x-ray.  Procalcitonin minimally elevated at 0.24.  Unable to exclude infectious etiology at this point.  Considering hazy opacities at bases on lungs.  Aspiration pneumonia is a possibility. Plan: Levofloxacin per pharmacy recommendations.  750 mg daily x 5 days Daily CBC   Elevated troponin Noted on 3/22 after complaints of chest pain.  At this point EKG with ischemic appearing.  I suspect supply demand ischemia.  Patient likely not a good candidate for any kind of ischemic evaluation given multiple rib fractures, hemothorax, high risk for bleeding.  Cardiology signed off.  No further inpatient cardiac diagnostics  Acute blood loss anemia: Hemoglobin was 9.8 on admission.  Has dropped to 7.4.  Patient remains hemodynamically stable.  Stable appearance of hemothorax.  Cardiology to restart Plavix 3/22.  Transfused 1 unit PRBC on 3/22.  Hemoglobin stable.  No evidence of acute blood loss.    Acute hypoxic respiratory failure: He is on 3 L/min oxygen via [place].  Wean off oxygen as able.  Stress incentive spirometry use.   Transfusion as above.     CAD s/p coronary stent in January 2024, history of stroke: Continue aspirin.  Plavix has been held because of hemothorax.  Plavix restarted 3/22.  Patient had some chest pain at night.  Troponins trended.     Type II DM with hyperglycemia: NovoLog as needed.  Metformin and Januvia have been held.   Dementia: Continue supportive care  DVT prophylaxis: SCD Code Status: Full Family Communication:  spouse at bedside 3/22, 3/23, 3/24, 3/25 Disposition Plan: Status is: Inpatient Remains inpatient appropriate because: Acute respiratory failure.  Acute blood loss anemia.  Multiple rib fractures requiring pain control.   Level of care: Med-Surg  Consultants:  Cardiology General surgery, signed off  Procedures:  None  Antimicrobials: None   Subjective: Seen and examined.  Patient sleepy/lethargic.  Slightly improved from yesterday.  Spouse at bedside.  Objective: Vitals:   06/26/22 0020 06/26/22 0311 06/26/22 0741 06/26/22 1214  BP: (!) 186/80 (!) 175/70 (!) 160/66 (!) 150/69  Pulse: 91 93 93 85  Resp: 20 18 14 18   Temp: (!) 100.6 F (38.1 C) 97.7 F (36.5 C) 99 F (37.2 C) 98.2 F (36.8 C)  TempSrc:      SpO2: 94% 90% 94% 91%  Weight:      Height:        Intake/Output Summary (Last 24 hours) at 06/26/2022 1318 Last data filed at 06/26/2022 0745 Gross per 24 hour  Intake --  Output 1200 ml  Net -1200 ml   Filed Weights   06/19/22 2124  Weight: 69.6 kg    Examination:  General exam: Sleepy/lethargic.  Chronically ill-appearing. Respiratory system: Bibasilar crackles.  Normal work of breathing.  3 L Cardiovascular system: S1-S2, RRR, no murmurs, no pedal edema Gastrointestinal system: Soft,/ND, normal bowel sounds Central nervous system: Lethargic but arousable.  Oriented.  Answers questions appropriately.  No focal deficits Extremities: Unable to assess power Skin: No rashes, lesions or ulcers Psychiatry: Judgement and insight appear  impaired. Mood & affect flattened.     Data Reviewed: I have personally reviewed following labs and imaging studies  CBC: Recent Labs  Lab 06/21/22 0510 06/21/22 2148 06/22/22 1525 06/23/22 0719 06/24/22 0115 06/25/22 0354 06/26/22 0404  WBC 10.5   < > 13.8* 14.5* 12.5* 14.7* 15.4*  NEUTROABS 8.1*  --  11.8*  --   --   --   --   HGB 6.7*   < > 8.3* 7.4* 8.8* 9.2* 10.1*  HCT 20.7*   < > 25.0* 22.9* 27.0* 28.5* 31.5*  MCV 93.2   < > 91.9 93.1 91.5 92.8 90.5  PLT 235   < > 269 256 261 308 382   < > = values in this interval not displayed.   Basic Metabolic Panel: Recent Labs  Lab 06/20/22 0425 06/22/22 1525 06/23/22 1428 06/24/22 0115  NA 134* 133* 136 134*  K 4.3 4.1 4.0 4.1  CL 98 104 104 100  CO2 24 23 24 24   GLUCOSE 171* 219* 182* 190*  BUN 25* 31* 38* 36*  CREATININE 1.17 1.01 1.02 0.86  CALCIUM 8.7* 8.1* 8.1* 8.1*  MG  --   --  2.3 2.1   GFR: Estimated Creatinine Clearance: 63 mL/min (by C-G formula based on SCr of 0.86 mg/dL). Liver  Function Tests: Recent Labs  Lab 06/20/22 0425  AST 31  ALT 21  ALKPHOS 62  BILITOT 0.9  PROT 6.5  ALBUMIN 3.4*   No results for input(s): "LIPASE", "AMYLASE" in the last 168 hours. No results for input(s): "AMMONIA" in the last 168 hours. Coagulation Profile: No results for input(s): "INR", "PROTIME" in the last 168 hours. Cardiac Enzymes: No results for input(s): "CKTOTAL", "CKMB", "CKMBINDEX", "TROPONINI" in the last 168 hours. BNP (last 3 results) No results for input(s): "PROBNP" in the last 8760 hours. HbA1C: No results for input(s): "HGBA1C" in the last 72 hours. CBG: Recent Labs  Lab 06/25/22 1233 06/25/22 1636 06/25/22 2126 06/26/22 0958 06/26/22 1217  GLUCAP 188* 167* 213* 239* 236*   Lipid Profile: No results for input(s): "CHOL", "HDL", "LDLCALC", "TRIG", "CHOLHDL", "LDLDIRECT" in the last 72 hours. Thyroid Function Tests: No results for input(s): "TSH", "T4TOTAL", "FREET4", "T3FREE", "THYROIDAB"  in the last 72 hours. Anemia Panel: No results for input(s): "VITAMINB12", "FOLATE", "FERRITIN", "TIBC", "IRON", "RETICCTPCT" in the last 72 hours. Sepsis Labs: Recent Labs  Lab 06/26/22 0843  PROCALCITON 0.24    No results found for this or any previous visit (from the past 240 hour(s)).       Radiology Studies: DG Chest Port 1 View  Result Date: 06/25/2022 CLINICAL DATA:  Hypoxia.  Right rib fracture. EXAM: PORTABLE CHEST 1 VIEW COMPARISON:  06/23/2022 and chest CT 06/21/2022 FINDINGS: Again noted are displaced right lower rib fractures. Negative for pneumothorax. Persistent hazy densities in the right lower chest that are poorly characterized due to overlying support apparatuses. However, the densities at the right lung base may have increased since 06/23/2022. Concern for a small left pleural effusion. Heart size is stable. Partial visualization of the cervical surgical plate. IMPRESSION: 1. Increased densities in the right lower chest. Findings concerning for increased atelectasis and/or pleural fluid. This area is poorly characterized due to overlying support apparatuses. 2. Concern for small left pleural effusion. 3. Displaced right rib fractures without pneumothorax. Electronically Signed   By: Markus Daft M.D.   On: 06/25/2022 09:15        Scheduled Meds:  aspirin EC  81 mg Oral Daily   atorvastatin  80 mg Oral Daily   clopidogrel  75 mg Oral Daily   feeding supplement  237 mL Oral TID BM   fluticasone  1 spray Each Nare Daily   guaiFENesin  600 mg Oral BID   insulin aspart  0-5 Units Subcutaneous QHS   insulin aspart  0-9 Units Subcutaneous TID WC   levofloxacin  750 mg Oral Daily   lidocaine  1 patch Transdermal Q24H   lisinopril  20 mg Oral Daily   metoprolol tartrate  25 mg Oral BID   multivitamin with minerals  1 tablet Oral Daily   pantoprazole  20 mg Oral Daily   sertraline  25 mg Oral Daily   Continuous Infusions:   LOS: 6 days     Sidney Ace,  MD [facility]   If 7PM-7AM, please contact night-coverage  06/26/2022, 1:18 PM

## 2022-06-26 NOTE — Plan of Care (Signed)

## 2022-06-26 NOTE — Consult Note (Signed)
Pharmacy Antibiotic Note  Raymond Sampson is a 82 y.o. male admitted on 06/19/2022 with pneumonia.  Pharmacy has been consulted for levofloxacin dosing.  Plan: Start levofloxacin 750 mg daily x 5 days.   Height: 5\' 7"  (170.2 cm) Weight: 69.6 kg (153 lb 7 oz) IBW/kg (Calculated) : 66.1  Temp (24hrs), Avg:98.6 F (37 C), Min:97.3 F (36.3 C), Max:100.6 F (38.1 C)  Recent Labs  Lab 06/19/22 1145 06/20/22 0425 06/21/22 0510 06/22/22 1525 06/23/22 0719 06/23/22 1428 06/24/22 0115 06/25/22 0354 06/26/22 0404  WBC 15.7*  --    < > 13.8* 14.5*  --  12.5* 14.7* 15.4*  CREATININE 1.29* 1.17  --  1.01  --  1.02 0.86  --   --    < > = values in this interval not displayed.    Estimated Creatinine Clearance: 63 mL/min (by C-G formula based on SCr of 0.86 mg/dL).    Allergies  Allergen Reactions   Metformin And Related Itching    Erectile dysfunction   Simvastatin Rash    rash    Antimicrobials this admission: 3/25 levofloxacin >>   Dose adjustments this admission: none  Microbiology results: None  Thank you for allowing pharmacy to be a part of this patient's care.  Oswald Hillock, PharmD, BCPS 06/26/2022 11:23 AM

## 2022-06-27 DIAGNOSIS — Z7189 Other specified counseling: Secondary | ICD-10-CM | POA: Diagnosis not present

## 2022-06-27 DIAGNOSIS — T148XXA Other injury of unspecified body region, initial encounter: Secondary | ICD-10-CM | POA: Diagnosis not present

## 2022-06-27 LAB — CBC
HCT: 29.8 % — ABNORMAL LOW (ref 39.0–52.0)
Hemoglobin: 9.7 g/dL — ABNORMAL LOW (ref 13.0–17.0)
MCH: 29.4 pg (ref 26.0–34.0)
MCHC: 32.6 g/dL (ref 30.0–36.0)
MCV: 90.3 fL (ref 80.0–100.0)
Platelets: 368 10*3/uL (ref 150–400)
RBC: 3.3 MIL/uL — ABNORMAL LOW (ref 4.22–5.81)
RDW: 13.7 % (ref 11.5–15.5)
WBC: 14.8 10*3/uL — ABNORMAL HIGH (ref 4.0–10.5)
nRBC: 0 % (ref 0.0–0.2)

## 2022-06-27 LAB — GLUCOSE, CAPILLARY
Glucose-Capillary: 191 mg/dL — ABNORMAL HIGH (ref 70–99)
Glucose-Capillary: 161 mg/dL — ABNORMAL HIGH (ref 70–99)
Glucose-Capillary: 205 mg/dL — ABNORMAL HIGH (ref 70–99)
Glucose-Capillary: 244 mg/dL — ABNORMAL HIGH (ref 70–99)

## 2022-06-27 MED ORDER — INSULIN ASPART 100 UNIT/ML IJ SOLN
0.0000 [IU] | Freq: Every day | INTRAMUSCULAR | Status: DC
  Administered 2022-06-27 – 2022-06-29 (×3): 2 [IU] via SUBCUTANEOUS
  Administered 2022-06-30: 3 [IU] via SUBCUTANEOUS
  Administered 2022-07-01: 2 [IU] via SUBCUTANEOUS
  Administered 2022-07-02: 3 [IU] via SUBCUTANEOUS
  Filled 2022-06-27 (×5): qty 1

## 2022-06-27 MED ORDER — INSULIN ASPART 100 UNIT/ML IJ SOLN
0.0000 [IU] | Freq: Three times a day (TID) | INTRAMUSCULAR | Status: DC
  Administered 2022-06-27 – 2022-06-29 (×5): 5 [IU] via SUBCUTANEOUS
  Administered 2022-06-29 – 2022-06-30 (×4): 3 [IU] via SUBCUTANEOUS
  Administered 2022-06-30: 5 [IU] via SUBCUTANEOUS
  Administered 2022-07-01: 8 [IU] via SUBCUTANEOUS
  Administered 2022-07-01 – 2022-07-02 (×3): 5 [IU] via SUBCUTANEOUS
  Administered 2022-07-02 – 2022-07-03 (×4): 3 [IU] via SUBCUTANEOUS
  Filled 2022-06-27 (×17): qty 1

## 2022-06-27 MED ORDER — SENNOSIDES-DOCUSATE SODIUM 8.6-50 MG PO TABS
1.0000 | ORAL_TABLET | Freq: Two times a day (BID) | ORAL | Status: DC
  Administered 2022-06-27 – 2022-07-03 (×8): 1 via ORAL
  Filled 2022-06-27 (×11): qty 1

## 2022-06-27 NOTE — Progress Notes (Signed)
PROGRESS NOTE    Raymond Sampson  [id number] DOB: 1940-12-28 DOA: 06/19/2022 PCP: [name], MD    Brief Narrative:  Raymond Sampson is a 82 y.o. male with medical history significant of coronary artery disease status post multiple stenting's with most recent stenting January 2024 x 2, carotid disease, CVA, type 2 diabetes, dementia, hyperlipidemia, hypertension, who presented to the hospital after a fall at home.  Reportedly, he slipped on soap in the tub and landed on his right side.   Workup revealed multiple right rib fractures (8 to 12), intramuscular hematoma within the right paraspinal and posterior flank musculature and small right hemothorax.  Plavix was held given 9 acute bleed.  General surgery consulted.  No surgical intervention indicated for hemothorax.  Stable appearance on imaging.  Cardiology to restart Plavix 3/22.  3/23: Patient transferred to progressive after high-sensitivity troponin 3000+ after reports of chest pain.  EKG reviewed, nonischemic.  Patient asymptomatic other than that and chest pain resolved.  Troponins trended, peaked at approximately 5000.  3/24: Palliative care met with patient and family at bedside.  At this time patient remains full scope of care.   Assessment & Plan:   Principal Problem:   Intramuscular hematoma Active Problems:   Rib fractures   Status post coronary artery stent placement   Hyperlipidemia   Essential hypertension   Coronary artery disease of native artery of native heart with stable angina pectoris (HCC)   Diabetes mellitus type 2, controlled, with complications (HCC)   Hemothorax on right   Leukocytosis   Multiple rib fractures involving four or more ribs, right side   Acute blood loss anemia   Fall   Malnutrition of moderate degree  Multiple right rib fractures (8th through 12th), right T11 transverse process fracture, intramuscular hematoma within the right paraspinal and posterior flank musculature, and   right hemothorax, s/p fall: CT chest without contrast (done on 06/21/2022) showed small to moderate size right pneumothorax (increased from prior study), near complete right lower lobe atelectasis/consolidation new dependent opacities in the left lower lobe and lingula which may present atelectasis or developing pneumonia.  Clinically, doubt pneumonia at this time.  He was evaluated by Dr. Sima Matas, general surgeon.  Conservative management recommended at this time.  General surgery has signed off as of 3/22 Plan: As needed pain control.  Stress incentive spirometry use.  Mobility as tolerated.  Wean oxygen as tolerated.  Attempted minimize sedating pain medication.  Lidoderm patch added.  No negation for surgical intervention  Leukocytosis Uptrending leukocytosis noted over the past 2 days.  On 3/25 leukocytosis up to 15.  Unclear etiology.  Patient not febrile.  Urinalysis clear.  Possible hazy opacities on chest x-ray.  Procalcitonin minimally elevated at 0.24.  Unable to exclude infectious etiology at this point.  Considering hazy opacities at bases on lungs.  Aspiration pneumonia is a possibility. Plan: Levofloxacin per pharmacy recommendations.  750 mg daily x 5 days Daily CBC, leukocytosis downtrending   Elevated troponin Noted on 3/22 after complaints of chest pain.  At this point EKG with ischemic appearing.  I suspect supply demand ischemia.  Patient likely not a good candidate for any kind of ischemic evaluation given multiple rib fractures, hemothorax, high risk for bleeding.  Cardiology signed off.  No further inpatient cardiac diagnostics  Acute blood loss anemia: Hemoglobin was 9.8 on admission.  Has dropped to 7.4.  Patient remains hemodynamically stable.  Stable appearance of hemothorax.  Cardiology to restart Plavix 3/22.  Transfused 1 unit PRBC on 3/22.  Hemoglobin stable.  No evidence of acute blood loss.    Acute hypoxic respiratory failure: He is on 3 L/min oxygen via [place].   Wean off oxygen as able.  Stress incentive spirometry use.  Transfusion as above.     CAD s/p coronary stent in January 2024, history of stroke: Continue aspirin.  Plavix has been held because of hemothorax.  Plavix restarted 3/22.  Patient had some chest pain at night.  Troponins trended.     Type II DM with hyperglycemia: NovoLog as needed.  Metformin and Januvia have been held.   Dementia: Continue supportive care  DVT prophylaxis: SCD Code Status: Full Family Communication:  spouse at bedside 3/22, 3/23, 3/24, 3/25, 3/26 Disposition Plan: Status is: Inpatient Remains inpatient appropriate because: Acute respiratory failure.  Acute blood loss anemia.  Multiple rib fractures requiring pain control.   Level of care: Med-Surg  Consultants:  Cardiology, signed off General surgery, signed off  Procedures:  None  Antimicrobials: None   Subjective: Seen and examined.  Patient sleepy/lethargic.  Slightly improved from yesterday.  Spouse at bedside.  Objective: Vitals:   06/26/22 2322 06/27/22 0537 06/27/22 0736 06/27/22 1247  BP: (!) 152/70 (!) 153/71 (!) 142/66 (!) 107/58  Pulse: 73 96 88 66  Resp: 18 18 14 17   Temp: 98.1 F (36.7 C) 98.1 F (36.7 C) 97.8 F (36.6 C) 98.1 F (36.7 C)  TempSrc: Oral     SpO2: 97% 93% 93% 97%  Weight:  67.8 kg    Height:        Intake/Output Summary (Last 24 hours) at 06/27/2022 1327 Last data filed at 06/27/2022 1025 Gross per 24 hour  Intake 480 ml  Output 800 ml  Net -320 ml   Filed Weights   06/19/22 2124 06/27/22 0537  Weight: 69.6 kg 67.8 kg    Examination:  General exam: Sleepy.  Blunted affect.  Chronically ill-appearing Respiratory system: Bibasilar crackles.  Normal work of breathing.  3 L Cardiovascular system: S1-S2, RRR, no murmurs, no pedal edema Gastrointestinal system: Soft,/ND, normal bowel sounds Central nervous system: Lethargic but arousable.  Oriented.  Answers questions appropriately.  No focal  deficits Extremities: Unable to assess power Skin: No rashes, lesions or ulcers Psychiatry: Judgement and insight appear impaired. Mood & affect flattened.     Data Reviewed: I have personally reviewed following labs and imaging studies  CBC: Recent Labs  Lab 06/21/22 0510 06/21/22 2148 06/22/22 1525 06/23/22 0719 06/24/22 0115 06/25/22 0354 06/26/22 0404 06/27/22 0514  WBC 10.5   < > 13.8* 14.5* 12.5* 14.7* 15.4* 14.8*  NEUTROABS 8.1*  --  11.8*  --   --   --   --   --   HGB 6.7*   < > 8.3* 7.4* 8.8* 9.2* 10.1* 9.7*  HCT 20.7*   < > 25.0* 22.9* 27.0* 28.5* 31.5* 29.8*  MCV 93.2   < > 91.9 93.1 91.5 92.8 90.5 90.3  PLT 235   < > 269 256 261 308 382 368   < > = values in this interval not displayed.   Basic Metabolic Panel: Recent Labs  Lab 06/22/22 1525 06/23/22 1428 06/24/22 0115  NA 133* 136 134*  K 4.1 4.0 4.1  CL 104 104 100  CO2 23 24 24   GLUCOSE 219* 182* 190*  BUN 31* 38* 36*  CREATININE 1.01 1.02 0.86  CALCIUM 8.1* 8.1* 8.1*  MG  --  2.3 2.1   GFR:  Estimated Creatinine Clearance: 63 mL/min (by C-G formula based on SCr of 0.86 mg/dL). Liver Function Tests: No results for input(s): "AST", "ALT", "ALKPHOS", "BILITOT", "PROT", "ALBUMIN" in the last 168 hours.  No results for input(s): "LIPASE", "AMYLASE" in the last 168 hours. No results for input(s): "AMMONIA" in the last 168 hours. Coagulation Profile: No results for input(s): "INR", "PROTIME" in the last 168 hours. Cardiac Enzymes: No results for input(s): "CKTOTAL", "CKMB", "CKMBINDEX", "TROPONINI" in the last 168 hours. BNP (last 3 results) No results for input(s): "PROBNP" in the last 8760 hours. HbA1C: No results for input(s): "HGBA1C" in the last 72 hours. CBG: Recent Labs  Lab 06/26/22 1217 06/26/22 1712 06/26/22 2102 06/27/22 0741 06/27/22 1247  GLUCAP 236* 227* 245* 191* 161*   Lipid Profile: No results for input(s): "CHOL", "HDL", "LDLCALC", "TRIG", "CHOLHDL", "LDLDIRECT" in the  last 72 hours. Thyroid Function Tests: No results for input(s): "TSH", "T4TOTAL", "FREET4", "T3FREE", "THYROIDAB" in the last 72 hours. Anemia Panel: No results for input(s): "VITAMINB12", "FOLATE", "FERRITIN", "TIBC", "IRON", "RETICCTPCT" in the last 72 hours. Sepsis Labs: Recent Labs  Lab 06/26/22 0843  PROCALCITON 0.24    No results found for this or any previous visit (from the past 240 hour(s)).       Radiology Studies: No results found.      Scheduled Meds:  aspirin EC  81 mg Oral Daily   atorvastatin  80 mg Oral Daily   clopidogrel  75 mg Oral Daily   feeding supplement  237 mL Oral TID BM   fluticasone  1 spray Each Nare Daily   guaiFENesin  600 mg Oral BID   insulin aspart  0-15 Units Subcutaneous TID WC   insulin aspart  0-5 Units Subcutaneous QHS   levofloxacin  750 mg Oral Daily   lidocaine  1 patch Transdermal Q24H   lisinopril  20 mg Oral Daily   metoprolol tartrate  25 mg Oral BID   multivitamin with minerals  1 tablet Oral Daily   pantoprazole  20 mg Oral Daily   senna-docusate  1 tablet Oral BID   sertraline  25 mg Oral Daily   Continuous Infusions:   LOS: 7 days     Sidney Ace, MD [facility]   If 7PM-7AM, please contact night-coverage  06/27/2022, 1:27 PM

## 2022-06-27 NOTE — Progress Notes (Signed)
Physical Therapy Treatment Patient Details Name: Raymond Sampson MRN: [id number] DOB: Aug 21, 1940 Today's Date: 06/27/2022   History of Present Illness Raymond Sampson is an 58yoM who comes to [facility] on 06/19/22 after a fall in shower at home, hit Rt flank, orthostatic upon arrival to ED 128/66 sitting to 94/60 standing. PMH: CAD, CVA, DM, HTN. Chest CT shows 'large intramuscular hematoma' with the Rt paraspinals. ABD CT revealing fracture of Rt ribs 8-12, (9-12 are displaced), fracture of T11 Rt transverse process, and 'small right hemothorax with adjacent atelectasis or contusion in the right lower lobe.'    PT Comments    Pt was asleep in long sitting with supportive spouse and sister in-law present. He was easily able to wake and participate however remains lethargic during session. Pt was disoriented x 2. He did know he was in the hospital and that he fell in the shower. Pt requires extensive assistance to safely exit bed and to stand. Max assist for all mobility and transfers. Stood/squat pivot from EOB > < recliner without AD. Pt only tolerated sitting in recliner x ~ 45 minutes prior to needing to return to bed from fatigue. Pt is far from baseline. He will require extensive PT to maximize independence while decreasing caregiver burden. Highly recommend SNF at DC if plan remains to return to [facility] and continue aggressive measures.    Recommendations for follow up therapy are one component of a multi-disciplinary discharge planning process, led by the attending physician.  Recommendations may be updated based on patient status, additional functional criteria and insurance authorization.     Assistance Recommended at Discharge Frequent or constant Supervision/Assistance  Patient can return home with the following Two people to help with walking and/or transfers;Two people to help with bathing/dressing/bathroom;Assistance with cooking/housework;Help with stairs or ramp for entrance;Assist for  transportation;Direct supervision/assist for medications management   Equipment Recommendations  Other (comment) (Defer to next level of care)       Precautions / Restrictions Precautions Precautions: Fall Precaution Comments: rib fx Restrictions Weight Bearing Restrictions: No     Mobility  Bed Mobility Overal bed mobility: Needs Assistance Bed Mobility: Supine to Sit, Sit to Supine  Supine to sit: Mod assist, HOB elevated, Max assist  General bed mobility comments: increased time due to pain and lethargy    Transfers Overall transfer level: Needs assistance Equipment used: Rolling walker (2 wheels), None Transfers: Sit to/from Stand, Bed to chair/wheelchair/BSC Sit to Stand: Max assist, From elevated surface     Lateral/Scoot Transfers: Max assist General transfer comment: pt attempted standing 3 x EOB with max assist of one + RW.He was able to clear hips however poor overall standing posture. author elected to stand/squat pivot pt to/from recliner without AD with max assist. pt is very anxious in standing    Ambulation/Gait  General Gait Details: currently unable    Balance Overall balance assessment: Needs assistance Sitting-balance support: Feet supported, Bilateral upper extremity supported Sitting balance-Leahy Scale: Fair Sitting balance - Comments: pt sat EOB with close SBA x ~ 12 minutes throughout the day   Standing balance support: During functional activity, Bilateral upper extremity supported Standing balance-Leahy Scale: Zero Standing balance comment: pt requires constant max assist to maintain standing       Cognition Arousal/Alertness: Awake/alert Behavior During Therapy: WFL for tasks assessed/performed Overall Cognitive Status: History of cognitive impairments - at baseline    General Comments: Pt lethargic but was able to awake and stay alert during session with occasional cueing to  stay awake               Pertinent Vitals/Pain Pain  Assessment Pain Assessment: 0-10 Pain Score: 6  Pain Location: R flank Pain Descriptors / Indicators: Discomfort, Dull, Grimacing, Aching, Constant Pain Intervention(s): Limited activity within patient's tolerance, Monitored during session, Premedicated before session, Repositioned     PT Goals (current goals can now be found in the care plan section) Acute Rehab PT Goals Patient Stated Goal: return to mobility Progress towards PT goals: Progressing toward goals    Frequency    Min 3X/week      PT Plan Current plan remains appropriate       AM-PAC PT "6 Clicks" Mobility   Outcome Measure  Help needed turning from your back to your side while in a flat bed without using bedrails?: A Lot Help needed moving from lying on your back to sitting on the side of a flat bed without using bedrails?: A Lot Help needed moving to and from a bed to a chair (including a wheelchair)?: Total Help needed standing up from a chair using your arms (e.g., wheelchair or bedside chair)?: Total Help needed to walk in hospital room?: Total Help needed climbing 3-5 steps with a railing? : Total 6 Click Score: 8    End of Session Equipment Utilized During Treatment: Oxygen (weaned pt from 4 L to 3 L during session. RN aware) Activity Tolerance: Patient limited by fatigue;Patient limited by lethargy;Patient limited by pain Patient left: in bed;with call bell/phone within reach;with bed alarm set;with family/visitor present Nurse Communication: Mobility status PT Visit Diagnosis: Unsteadiness on feet (R26.81);Other abnormalities of gait and mobility (R26.89);Repeated falls (R29.6);Muscle weakness (generalized) (M62.81)     Time: [id number] PT Time Calculation (min) (ACUTE ONLY): 27 min  Charges:  $Therapeutic Activity: 23-37 mins                    Julaine Fusi PTA 06/27/22, 2:54 PM

## 2022-06-27 NOTE — TOC Progression Note (Addendum)
Transition of Care [facility]) - Progression Note    Patient Details  Name: PINK MOREIN MRN: [id number] Date of Birth: [birth year]  [facility] CM/SW Contact  Laurena Slimmer, RN Phone Number: 06/27/2022, 3:38 PM  Clinical Narrative:    Spoke with family at bedside. Wife accepted bed offer for [facility]. She has been advised insurance would have to be approved.   Attempt to contact HTA to start auth. No answer.    Expected Discharge Plan: [place] Barriers to Discharge: Continued Medical Work up  Expected Discharge Plan and Services     Post Acute Care Choice: [place] Living arrangements for the past 2 months: Single Family Home                                       Social Determinants of Health (SDOH) Interventions SDOH Screenings   Food Insecurity: No Food Insecurity (06/19/2022)  Housing: Low Risk  (06/19/2022)  Transportation Needs: No Transportation Needs (06/19/2022)  Utilities: Not At Risk (06/19/2022)  Depression (PHQ2-9): Low Risk  (05/17/2022)  Tobacco Use: Medium Risk (06/20/2022)    Readmission Risk Interventions     No data to display

## 2022-06-27 NOTE — Progress Notes (Signed)
PT Cancellation Note  Patient Details Name: TREVONNE WINES MRN: [id number] DOB: 1940/10/11   Cancelled Treatment:     PT attempt. PT hold per family requested. Pt just finished breakfast and is currently sleeping soundly. Spouse requested Pryor Curia return at 1130 am. Will return then, as requested and continue to follow per current POC.   Willette Pa 06/27/2022, 9:06 AM

## 2022-06-27 NOTE — Plan of Care (Signed)

## 2022-06-27 NOTE — Progress Notes (Signed)
Daily Progress Note   Patient Name: Raymond Sampson       Date: 06/27/2022 DOB: 1940-06-10  Age: 82 y.o. MRN#: [id number] Attending Physician: [place] Ace, MD Primary Care Physician: [name], MD Admit Date: 06/19/2022  Reason for Consultation/Follow-up: Establishing goals of care  Subjective: Notes and labs reviewed.  In to see patient.  Patient's sister-in-law is at bedside.  Returned at a later time to speak with patient's wife.  She states her sister has been her support, and has been here daily.    Wife discusses family dynamics, and that patient has 2 children, a son and a daughter.  Wife discusses dynamics and concerns there.  Wife states there are no advanced directives and she is the surrogate decision maker.  Wife states she has children from a previous marriage as well.    She discusses the patient has been able to drive short distances.  She states he has been independent.  She advises that he has told her in the past that he does not want to be a burden, and since he has been at the hospital, at night tells her to go home to get some rest.  She states she is praying to God for him to be comfortable for him not to suffer.   We discussed his diagnoses, prognosis, GOC, EOL wishes disposition and options.  Created space and opportunity for patient  to explore thoughts and feelings regarding current medical information.   A detailed discussion was had today regarding advanced directives.  Concepts specific to code status, artifical feeding and hydration, IV antibiotics and rehospitalization were discussed.  The difference between an aggressive medical intervention path and a comfort care path was discussed.  Values and goals of care important to patient and family were  attempted to be elicited.  Discussed limitations of medical interventions to prolong quality of life in some situations and discussed the concept of human mortality.    Patient has difficulty with awakening and only opens eyes momentarily.  Wife is asking patient questions trying to find out his feelings on care moving forward.  With asking his thoughts on if he would want chest compressions shocks and a breathing tube if his heart and breathing stops versus leaving this earth peacefully and naturally, he is able to state with effort "  I want to leave peacefully".  When asked if he would ever want to be put on the ventilator or life support with a breathing tube, or if he would want to leave peacefully and naturally and be with his family, he states "I would want to be with my family and go".  His wife asked him if he wants to go home and he states "I want to go home".  Wife states earlier her husband was sitting in the chair, and thinks he may be tired following being put back in bed.  She has concern of whether or not he knows what he saying in regards to [place] and care as he previously requested a full code/full scope status.  We discussed remeeting tomorrow at 1230 at bedside to discuss further.  She is aware that CODE STATUS and course of care can be changed between now and tomorrow by the attending team.  RN into bedside to administer medications.  Epic chat sent to attending to relay patient's current status.  Length of Stay: 7  Current Medications: Scheduled Meds:   aspirin EC  81 mg Oral Daily   atorvastatin  80 mg Oral Daily   clopidogrel  75 mg Oral Daily   feeding supplement  237 mL Oral TID BM   fluticasone  1 spray Each Nare Daily   guaiFENesin  600 mg Oral BID   insulin aspart  0-15 Units Subcutaneous TID WC   insulin aspart  0-5 Units Subcutaneous QHS   levofloxacin  750 mg Oral Daily   lidocaine  1 patch Transdermal Q24H   lisinopril  20 mg Oral Daily   metoprolol  tartrate  25 mg Oral BID   multivitamin with minerals  1 tablet Oral Daily   pantoprazole  20 mg Oral Daily   senna-docusate  1 tablet Oral BID   sertraline  25 mg Oral Daily    Continuous Infusions:   PRN Meds: acetaminophen, alum & mag hydroxide-simeth, morphine injection, nitroGLYCERIN, ondansetron **OR** ondansetron (ZOFRAN) IV, oxyCODONE, polyvinyl alcohol  Physical Exam Constitutional:      Comments: Opens eyes briefly  Pulmonary:     Effort: Pulmonary effort is normal.             Vital Signs: BP (!) 107/58 (BP Location: Left Arm)   Pulse 66   Temp 98.1 F (36.7 C)   Resp 17   Ht 5\' 7"  (1.702 m)   Wt 67.8 kg   SpO2 97%   BMI 23.41 kg/m  SpO2: SpO2: 97 % O2 Device: O2 Device: Nasal Cannula O2 Flow Rate: O2 Flow Rate (L/min): 3 L/min  Intake/output summary:  Intake/Output Summary (Last 24 hours) at 06/27/2022 1418 Last data filed at 06/27/2022 1025 Gross per 24 hour  Intake 480 ml  Output 800 ml  Net -320 ml   LBM: Last BM Date : 06/22/22 Baseline Weight: Weight: 69.6 kg Most recent weight: Weight: 67.8 kg    Patient Active Problem List   Diagnosis Date Noted   Fall 06/22/2022   Malnutrition of moderate degree 06/22/2022   Acute blood loss anemia 06/21/2022   Multiple rib fractures involving four or more ribs, right side 06/20/2022   Intramuscular hematoma 06/19/2022   Rib fractures 06/19/2022   Hemothorax on right 06/19/2022   Leukocytosis 06/19/2022   Status post coronary artery stent placement 05/02/2022   Mild dementia ([place]) 04/14/2022   Non-ST elevation (NSTEMI) myocardial infarction ([place]) 04/13/2022   History of COVID-19 04/13/2022   Lung  nodule 04/13/2022   PAD (peripheral artery disease) ([place]) 04/13/2022   Flu 06/05/2017   Diabetes mellitus type 2, controlled, with complications ([place]) 0000000   Carotid stenosis 05/14/2013   CAROTID BRUIT, LEFT 04/25/2010   PALPITATIONS 06/18/2009   Hyperlipidemia 04/27/2009   Essential hypertension  04/27/2009   Coronary artery disease of native artery of native heart with stable angina pectoris ([place]) 04/27/2009    Palliative Care Assessment & Plan     Recommendations/Plan: Plans to remain tomorrow at 1230 at bedside  Code Status:    Code Status Orders  (From admission, onward)           Start     Ordered   06/19/22 1720  Full code  Continuous       Question:  By:  Answer:  Consent: discussion documented in EHR   06/19/22 1721           Code Status History     Date Active Date Inactive Code Status Order ID Comments User Context   05/02/2022 1135 05/03/2022 1523 Full Code [id number]  Nelva Bush, MD Inpatient   04/13/2022 0048 04/15/2022 2115 Full Code [id number]  Gertie Fey, MD ED   06/05/2017 2015 06/07/2017 1757 Full Code [id number]  Saundra Shelling, MD Inpatient       Prognosis: Poor overall    Thank you for allowing the Palliative Medicine Team to assist in the care of this patient.    Asencion Gowda, NP  Please contact Palliative Medicine Team phone at [phone] for questions and concerns.

## 2022-06-28 DIAGNOSIS — T148XXA Other injury of unspecified body region, initial encounter: Secondary | ICD-10-CM | POA: Diagnosis not present

## 2022-06-28 DIAGNOSIS — Z7189 Other specified counseling: Secondary | ICD-10-CM | POA: Diagnosis not present

## 2022-06-28 LAB — CBC
HCT: 30.4 % — ABNORMAL LOW (ref 39.0–52.0)
Hemoglobin: 9.7 g/dL — ABNORMAL LOW (ref 13.0–17.0)
MCH: 29.2 pg (ref 26.0–34.0)
MCHC: 31.9 g/dL (ref 30.0–36.0)
MCV: 91.6 fL (ref 80.0–100.0)
Platelets: 408 10*3/uL — ABNORMAL HIGH (ref 150–400)
RBC: 3.32 MIL/uL — ABNORMAL LOW (ref 4.22–5.81)
RDW: 14 % (ref 11.5–15.5)
WBC: 13.5 10*3/uL — ABNORMAL HIGH (ref 4.0–10.5)
nRBC: 0 % (ref 0.0–0.2)

## 2022-06-28 LAB — GLUCOSE, CAPILLARY
Glucose-Capillary: 244 mg/dL — ABNORMAL HIGH (ref 70–99)
Glucose-Capillary: 220 mg/dL — ABNORMAL HIGH (ref 70–99)
Glucose-Capillary: 213 mg/dL — ABNORMAL HIGH (ref 70–99)
Glucose-Capillary: 248 mg/dL — ABNORMAL HIGH (ref 70–99)

## 2022-06-28 MED ORDER — ORAL CARE MOUTH RINSE: 15.0000 mL | OROMUCOSAL | Status: DC | PRN

## 2022-06-28 MED ORDER — ORAL CARE MOUTH RINSE
15.0000 mL | OROMUCOSAL | Status: DC
  Administered 2022-06-28 – 2022-07-03 (×21): 15 mL via OROMUCOSAL

## 2022-06-28 NOTE — Progress Notes (Signed)
Physical Therapy Treatment Patient Details Name: Raymond Sampson MRN: [id number] DOB: 1940/09/15 Today's Date: 06/28/2022   History of Present Illness Raymond Sampson is an 60yoM who comes to [facility] on 06/19/22 after a fall in shower at home, hit Rt flank, orthostatic upon arrival to ED 128/66 sitting to 94/60 standing. PMH: CAD, CVA, DM, HTN. Chest CT shows 'large intramuscular hematoma' with the Rt paraspinals. ABD CT revealing fracture of Rt ribs 8-12, (9-12 are displaced), fracture of T11 Rt transverse process, and 'small right hemothorax with adjacent atelectasis or contusion in the right lower lobe.'    PT Comments    Pt received EOB with OT present. Treatment session focused on improving sitting balance at EOB due to significant L Lateral lean to decrease pressure through Right side as well as transfer training to bedside chair. Pt required MaxA to squat pivot from bed to chair due to poor tolerance for weight bearing through LE's due to pain and fear of falling. 5/10 Right flank pain, able to seek comfort with pillow positioning in chair. Nursing educated on transferring back to bed towards Left side with arm dropped on recliner. Will continue per POC until transfer to STR.   Recommendations for follow up therapy are one component of a multi-disciplinary discharge planning process, led by the attending physician.  Recommendations may be updated based on patient status, additional functional criteria and insurance authorization.  Follow Up Recommendations  Can patient physically be transported by private vehicle: No    Assistance Recommended at Discharge Frequent or constant Supervision/Assistance  Patient can return home with the following Two people to help with walking and/or transfers;Two people to help with bathing/dressing/bathroom;Assistance with cooking/housework;Help with stairs or ramp for entrance;Assist for transportation;Direct supervision/assist for medications management    Equipment Recommendations  Other (comment) (TBD at next facility)    Recommendations for Other Services       Precautions / Restrictions Precautions Precautions: Fall Precaution Comments: rib fx Required Braces or Orthoses:  (HOB locked in raised position for aspiration precautions) Restrictions Weight Bearing Restrictions: No     Mobility  Bed Mobility Overal bed mobility: Needs Assistance Bed Mobility: Supine to Sit     Supine to sit: Mod assist, HOB elevated, Max assist Sit to supine: Total assist        Transfers Overall transfer level: Needs assistance Equipment used: None Transfers: Sit to/from Stand Sit to Stand: Max assist, From elevated surface Stand pivot transfers: Max assist, From elevated surface   Squat pivot transfers: Max assist, From elevated surface     General transfer comment:  (Pt unable to stand requiring MaxA to squat pivot to bedside chair.)    Ambulation/Gait               General Gait Details: currently unable   Stairs             Wheelchair Mobility    Modified Rankin (Stroke Patients Only)       Balance Overall balance assessment: Needs assistance Sitting-balance support: Feet supported, Bilateral upper extremity supported Sitting balance-Leahy Scale: Poor Sitting balance - Comments: pt sat EOB for 3 minutes upon arrival requiring ModA to prevent L Lateral lean Postural control: Left lateral lean Standing balance support: During functional activity, Bilateral upper extremity supported Standing balance-Leahy Scale: Zero Standing balance comment: pt requires constant max assist to maintain standing  Cognition Arousal/Alertness: Awake/alert Behavior During Therapy: WFL for tasks assessed/performed Overall Cognitive Status: History of cognitive impairments - at baseline                                 General Comments: Pt lethargic but was able to awake and  stay alert during session with occasional cueing to stay awake        Exercises General Exercises - Lower Extremity Ankle Circles/Pumps: AROM, Both, 10 reps Long Arc Quad: AROM, Both, 10 reps, Seated    General Comments General comments (skin integrity, edema, etc.):  (Pt educated on role of PT and current goals to progress strength and mobility. Note significantly large hematoma across right lateral thigh)      Pertinent Vitals/Pain Pain Assessment Pain Assessment: 0-10 Pain Score: 5  Pain Location: R flank Pain Descriptors / Indicators: Discomfort, Dull, Grimacing, Aching, Constant Pain Intervention(s): Limited activity within patient's tolerance, Premedicated before session    Home Living                          Prior Function            PT Goals (current goals can now be found in the care plan section) Acute Rehab PT Goals Patient Stated Goal: return to mobility Progress towards PT goals: Progressing toward goals    Frequency    Min 3X/week      PT Plan Current plan remains appropriate    Co-evaluation              AM-PAC PT "6 Clicks" Mobility   Outcome Measure  Help needed turning from your back to your side while in a flat bed without using bedrails?: A Lot Help needed moving from lying on your back to sitting on the side of a flat bed without using bedrails?: A Lot Help needed moving to and from a bed to a chair (including a wheelchair)?: Total Help needed standing up from a chair using your arms (e.g., wheelchair or bedside chair)?: Total Help needed to walk in hospital room?: Total Help needed climbing 3-5 steps with a railing? : Total 6 Click Score: 8    End of Session Equipment Utilized During Treatment: Oxygen (2L via [place], 95%) Activity Tolerance: Patient limited by fatigue;Patient limited by lethargy;Patient limited by pain Patient left: in chair;with call bell/phone within reach;with chair alarm set;with family/visitor  present Nurse Communication: Mobility status;Other (comment) (drop left arm rest to transfer back to bed) PT Visit Diagnosis: Unsteadiness on feet (R26.81);Other abnormalities of gait and mobility (R26.89);Repeated falls (R29.6);Muscle weakness (generalized) (M62.81)     Time: [id number] PT Time Calculation (min) (ACUTE ONLY): 15 min  Charges:  $Therapeutic Activity: 8-22 mins                    Mikel Cella, PTA  Josie Dixon 06/28/2022, 1:22 PM

## 2022-06-28 NOTE — Progress Notes (Signed)
Nutrition Follow-up  DOCUMENTATION CODES:   Non-severe (moderate) malnutrition in context of chronic illness  INTERVENTION:   -Continue Ensure Enlive po TID, each supplement provides 350 kcal and 20 grams of protein -Continue MVI with minerals daily -Continue with regular diet   NUTRITION DIAGNOSIS:   Moderate Malnutrition related to chronic illness (dementia) as evidenced by mild fat depletion, moderate fat depletion, mild muscle depletion, moderate muscle depletion.  Ongoning  GOAL:   Patient will meet greater than or equal to 90% of their needs  Progressing   MONITOR:   PO intake, Supplement acceptance  REASON FOR ASSESSMENT:   Consult Assessment of nutrition requirement/status  ASSESSMENT:   Pt with medical history significant of coronary artery disease status post multiple stenting's with most recent stenting January 2024 x 2, carotid disease, CVA, type 2 diabetes, dementia, hyperlipidemia, hypertension, who presented to the hospital after a fall at home  3/20- s/p BSE- recommend regular diet with thin liquids    Reviewed I/O's: -90 ml x 24 hours and -5.5 L since admission  UOP: 450 ml x 24 hours   Pt diet has been liberalized to regular. Noted meal completions have improved; PO 50-100%. Pt is also drinking Ensure supplements.   Reviewed wt hx; pt has experienced a 3% wt loss over the past week, which is significant for time frame. Suspect some wt loss is related to fluid loss due to -5.5 L since admission.   Per palliative care notes, pt is a DNR/ DNI. He would not desire a feeding tube.   Noted pt has not had a BM since 06/22/22; senokot ordered on 06/27/22.   Per TOC notes, plan for SNF placement once medically stable.   Medications reviewed.   Labs reviewed: Na: 134, CBGS: [id number] (inpatient orders for glycemic control are 0-15 units insulin aspart TID with meals and 0-5 units insulin aspart daily at bedtime).    Diet Order:   Diet Order              Diet regular Room service appropriate? Yes; Fluid consistency: Thin  Diet effective now                   EDUCATION NEEDS:   No education needs have been identified at this time  Skin:  Skin Assessment: Reviewed RN Assessment  Last BM:  06/22/22  Height:   Ht Readings from Last 1 Encounters:  06/19/22 5\' 7"  (1.702 m)    Weight:   Wt Readings from Last 1 Encounters:  06/28/22 67.5 kg    Ideal Body Weight:  67.3 kg  BMI:  Body mass index is 23.31 kg/m.  Estimated Nutritional Needs:   Kcal:  2050-2250  Protein:  105-120 grams  Fluid:  > 2 L    Loistine Chance, RD, LDN, [place] Registered Dietitian II Certified Diabetes Care and Education Specialist Please refer to [facility] for RD and/or RD on-call/weekend/after hours pager

## 2022-06-28 NOTE — Progress Notes (Signed)
Daily Progress Note   Patient Name: Raymond Sampson       Date: 06/28/2022 DOB: [birth year]  Age: 82 y.o. MRN#: [id number] Attending Physician: Sidney Ace, MD Primary Care Physician: [name], MD Admit Date: 06/19/2022  Reason for Consultation/Follow-up: Establishing goals of care  Subjective: Notes and labs reviewed. In to see patient. He is sitting in bedside chair with eyes closed. Wife is at bedside. She states due to weather, other family members will not be joining meeting.   She states he ate a good breakfast, and has been sleeping in bedside chair. She states he takes naps during the day at baseline.   Patient states his pain is 5/10 with sitting and 8/10 with movent. Educated on nutrition, pain, symptom management,and medication side effects. Discussed his sleepiness and possible outcomes, and concerns for prognosis.   She states she messaged his children to let them know his wishes on code status as per conversation yesterday. She states she would like to transition to DNR status. She would not want him intubated.   Spoke with patient to try to discern his wishes. He remained awake with frequent prompting by wife. He indicates he would not want CPR or ventilator support. MOST form was completed, and explained and inquired thoughts on a feeding tube. He states he would not ever want a feeding tube. Wife questioned this and patient confirmed no feeding tube.    I completed a MOST form today which was signed by patient as wife witnessed, and the signed original was placed in the chart. The form was scanned and sent to medical records for it to be uploaded under ACP tab in Epic. A photocopy was also placed in the chart to be scanned into EMR. The patient outlined their  wishes for the following treatment decisions:  Cardiopulmonary Resuscitation: Do Not Attempt Resuscitation (DNR/No CPR)  Medical Interventions: Limited Additional Interventions: Use medical treatment, IV fluids and cardiac monitoring as indicated, DO NOT USE intubation or mechanical ventilation. May consider use of less invasive airway support such as BiPAP or CPAP. Also provide comfort measures. Transfer to the hospital if indicated. Avoid intensive care.   Antibiotics: Antibiotics if indicated  IV Fluids: IV fluids if indicated  Feeding Tube: No feeding tube     Length of Stay: 8  Current  Medications: Scheduled Meds:   aspirin EC  81 mg Oral Daily   atorvastatin  80 mg Oral Daily   clopidogrel  75 mg Oral Daily   feeding supplement  237 mL Oral TID BM   fluticasone  1 spray Each Nare Daily   guaiFENesin  600 mg Oral BID   insulin aspart  0-15 Units Subcutaneous TID WC   insulin aspart  0-5 Units Subcutaneous QHS   levofloxacin  750 mg Oral Daily   lidocaine  1 patch Transdermal Q24H   lisinopril  20 mg Oral Daily   metoprolol tartrate  25 mg Oral BID   multivitamin with minerals  1 tablet Oral Daily   mouth rinse  15 mL Mouth Rinse 4 times per day   pantoprazole  20 mg Oral Daily   senna-docusate  1 tablet Oral BID   sertraline  25 mg Oral Daily    Continuous Infusions:   PRN Meds: acetaminophen, alum & mag hydroxide-simeth, morphine injection, nitroGLYCERIN, ondansetron **OR** ondansetron (ZOFRAN) IV, mouth rinse, oxyCODONE, polyvinyl alcohol  Physical Exam Constitutional:      Comments: Remained awake with prompting.              Vital Signs: BP 123/61 (BP Location: Left Arm)   Pulse 90   Temp (!) 97.5 F (36.4 C) (Axillary)   Resp 16   Ht 5\' 7"  (1.702 m)   Wt 67.5 kg   SpO2 96%   BMI 23.31 kg/m  SpO2: SpO2: 96 % O2 Device: O2 Device: Nasal Cannula O2 Flow Rate: O2 Flow Rate (L/min): 2 L/min  Intake/output summary:  Intake/Output Summary (Last 24 hours)  at 06/28/2022 1354 Last data filed at 06/28/2022 1044 Gross per 24 hour  Intake 340 ml  Output 850 ml  Net -510 ml   LBM: Last BM Date : 06/22/22 Baseline Weight: Weight: 69.6 kg Most recent weight: Weight: 67.5 kg   Patient Active Problem List   Diagnosis Date Noted   Fall 06/22/2022   Malnutrition of moderate degree 06/22/2022   Acute blood loss anemia 06/21/2022   Multiple rib fractures involving four or more ribs, right side 06/20/2022   Intramuscular hematoma 06/19/2022   Rib fractures 06/19/2022   Hemothorax on right 06/19/2022   Leukocytosis 06/19/2022   Status post coronary artery stent placement 05/02/2022   Mild dementia ([place]) 04/14/2022   Non-ST elevation (NSTEMI) myocardial infarction ([place]) 04/13/2022   History of COVID-19 04/13/2022   Lung nodule 04/13/2022   PAD (peripheral artery disease) ([place]) 04/13/2022   Flu 06/05/2017   Diabetes mellitus type 2, controlled, with complications ([place]) 0000000   Carotid stenosis 05/14/2013   CAROTID BRUIT, LEFT 04/25/2010   PALPITATIONS 06/18/2009   Hyperlipidemia 04/27/2009   Essential hypertension 04/27/2009   Coronary artery disease of native artery of native heart with stable angina pectoris ([place]) 04/27/2009    Palliative Care Assessment & Plan    Recommendations/Plan: DNR/DNI. Would continue metanebs Continue pulmonary toileting with  flutter valve, and with IS 6-10 breaths/hr while awake. Patient pulled 750 in my presence. Discussed goal of at least 1,000 on IS.  Would recommend pre-medicating with oral pain medication 1-3 hours prior to working with PT/OT.  Would recommend scheduled Tylenol for pain, 650 q 6 hours.  Would recommend Oxy IR 2.5-5mg  (half to 1 pill) q 4 hours PRN for moderate and severe pain.  Would recommend Flexeril 5-10mg  for muscle spasms from rib fractures.   Code Status:    Code Status Orders  (  From admission, onward)           Start     Ordered   06/19/22 1720  Full code   Continuous       Question:  By:  Answer:  Consent: discussion documented in EHR   06/19/22 1721           Code Status History     Date Active Date Inactive Code Status Order ID Comments User Context   05/02/2022 1135 05/03/2022 1523 Full Code [id number]  Nelva Bush, MD Inpatient   04/13/2022 0048 04/15/2022 2115 Full Code [id number]  Gertie Fey, MD ED   06/05/2017 2015 06/07/2017 1757 Full Code [id number]  Saundra Shelling, MD Inpatient       Prognosis:  Unable to determine   Care plan was discussed with attending  Thank you for allowing the Palliative Medicine Team to assist in the care of this patient.     Asencion Gowda, NP  Please contact Palliative Medicine Team phone at [phone] for questions and concerns.

## 2022-06-28 NOTE — TOC Progression Note (Addendum)
Transition of Care [facility]) - Progression Note    Patient Details  Name: Raymond Sampson MRN: [id number] Date of Birth: 14-Nov-1940  [facility] CM/SW Contact  Laurena Slimmer, RN Phone Number: 06/28/2022, 12:55 PM  Clinical Narrative:    Attempt to contact [employer] to start auth @ [phone]  No answer.  Left a message requesting SNF auth for [facility].   1:15pm Retrieved call from [place] from [facility]. Auth started for [facility]    Expected Discharge Plan: [place] Barriers to Discharge: Continued Medical Work up  Expected Discharge Plan and Services     Post Acute Care Choice: [place] Living arrangements for the past 2 months: Single Family Home                                       Social Determinants of Health (SDOH) Interventions SDOH Screenings   Food Insecurity: No Food Insecurity (06/19/2022)  Housing: Low Risk  (06/19/2022)  Transportation Needs: No Transportation Needs (06/19/2022)  Utilities: Not At Risk (06/19/2022)  Depression (PHQ2-9): Low Risk  (05/17/2022)  Tobacco Use: Medium Risk (06/20/2022)    Readmission Risk Interventions     No data to display

## 2022-06-28 NOTE — Progress Notes (Signed)
Occupational Therapy Treatment Patient Details Name: CASHON MITRI MRN: [id number] DOB: Feb 24, 1941 Today's Date: 06/28/2022   History of present illness Printess Hartung is an 42yoM who comes [facility] on [facility]22 after a fall in shower at home, hit Rt flank, orthostatic upon arrival to ED 128/66 sitting to 94/60 standing. PMH: CAD, CVA, DM, HTN. Chest CT shows 'large intramuscular hematoma' with the Rt paraspinals. ABD CT revealing fracture of Rt ribs 8-12, (9-12 are displaced), fracture of T11 Rt transverse process, and 'small right hemothorax with adjacent atelectasis or contusion in the right lower lobe.'   OT comments  Patient received in semi-fowler's position in bed and agreeable to OT. Pt lethargic initially, A&Ox2. Alertness improved with activity, however, pt anxious with all mobility this date. He required Max A to come to EOB. Once sitting EOB, pt engaged in seated grooming tasks. He fatigued quickly and required Mod A to prevent L lateral lean. Simulated toilet transfer completed to bedside recliner with Max A via squat pivot. Pt left sitting in recliner with all needs in reach. PT and spouse present in room. D/C recommendation remains appropriate. OT will continue to follow acutely.    Recommendations for follow up therapy are one component of a multi-disciplinary discharge planning process, led by the attending physician.  Recommendations may be updated based on patient status, additional functional criteria and insurance authorization.    Assistance Recommended at Discharge Frequent or constant Supervision/Assistance  Patient can return home with the following  Two people to help with walking and/or transfers;Two people to help with bathing/dressing/bathroom;Help with stairs or ramp for entrance;Direct supervision/assist for medications management;Direct supervision/assist for financial management;Assistance with cooking/housework;Assist for transportation;Assistance with feeding    Equipment Recommendations  Other (comment) (defer to next venue of care)    Recommendations for Other Services      Precautions / Restrictions Precautions Precautions: Fall Precaution Comments: rib fx, HOB locked in raised position for aspiration precautions Required Braces or Orthoses:  (HOB locked in raised position for aspiration precautions) Restrictions Weight Bearing Restrictions: No       Mobility Bed Mobility Overal bed mobility: Needs Assistance Bed Mobility: Supine to Sit     Supine to sit: HOB elevated, Max assist     General bed mobility comments: multimodal cues required due to impaired cognition, ultimately required Max A 2/2 being anxious with any movement    Transfers Overall transfer level: Needs assistance Equipment used: None Transfers: Bed to chair/wheelchair/BSC     Squat pivot transfers: Max assist, From elevated surface       General transfer comment: pt unable to stand this date, resisting movement 2/2 fear of falling, opted to complete squat pivot to bedside recliner     Balance Overall balance assessment: Needs assistance Sitting-balance support: Feet supported, Bilateral upper extremity supported Sitting balance-Leahy Scale: Poor Sitting balance - Comments: Pt sat EOB for ~8 min, attempted dynamic forward reaching but pt had difficulty maintaining sitting balance. Mod A required to prevent L lateral lean. Multimodal cues provided to encourage pt to self-correct balance, however, unsuccessful. Postural control: Left lateral lean   Standing balance-Leahy Scale: Zero           ADL either performed or assessed with clinical judgement   ADL Overall ADL's : Needs assistance/impaired     Grooming: Set up;Sitting;Wash/dry face Grooming Details (indicate cue type and reason): Mod A required to prevent LOB toward L side  Toilet Transfer: Production manager Details (indicate cue type and  reason): simulated to recliner                Extremity/Trunk Assessment Upper Extremity Assessment Upper Extremity Assessment: Generalized weakness   Lower Extremity Assessment Lower Extremity Assessment: Generalized weakness        Vision Patient Visual Report: No change from baseline     Perception     Praxis      Cognition Arousal/Alertness: Awake/alert, Lethargic Behavior During Therapy: Flat affect, Anxious Overall Cognitive Status: History of cognitive impairments - at baseline       General Comments: Lethargic initially. Frequent VC for redirection back to task, A&Ox2 (oriented to self, location)        Exercises      Shoulder Instructions       General Comments OT noting large hematoma across R lateral thigh/hip. Pt on 2L O2 via [place] t/o session, maintained >90% with activity.    Pertinent Vitals/ Pain       Pain Assessment Pain Assessment: 0-10 Pain Score: 5  Pain Location: R flank Pain Descriptors / Indicators: Discomfort, Dull, Grimacing, Aching, Constant Pain Intervention(s): Limited activity within patient's tolerance, Monitored during session, Premedicated before session, Repositioned  Home Living              Prior Functioning/Environment              Frequency  Min 2X/week        Progress Toward Goals  OT Goals(current goals can now be found in the care plan section)  Progress towards OT goals: Progressing toward goals  Acute Rehab OT Goals Patient Stated Goal: to improve pain OT Goal Formulation: With patient Time For Goal Achievement: 07/04/22 Potential to Achieve Goals: [place] Discharge plan remains appropriate;Frequency remains appropriate    Co-evaluation                 AM-PAC OT "6 Clicks" Daily Activity     Outcome Measure   Help from another person eating meals?: A Lot Help from another person taking care of personal grooming?: A Little Help from another person toileting, which includes  using toliet, bedpan, or urinal?: Total Help from another person bathing (including washing, rinsing, drying)?: Total Help from another person to put on and taking off regular upper body clothing?: A Little Help from another person to put on and taking off regular lower body clothing?: Total 6 Click Score: 11    End of Session    OT Visit Diagnosis: Other abnormalities of gait and mobility (R26.89);Muscle weakness (generalized) (M62.81)   Activity Tolerance Patient limited by fatigue;Patient limited by pain;Patient limited by lethargy   Patient Left in chair;with call bell/phone within reach;with chair alarm set;with family/visitor present;Other (comment) (PT present)   Nurse Communication Mobility status        Time: 1110-1135 OT Time Calculation (min): 25 min  Charges: OT General Charges $OT Visit: 1 Visit OT Treatments $Self Care/Home Management : 8-22 mins  [facility] MS, OTR/L ascom [phone]  06/28/22, 2:16 PM

## 2022-06-29 DIAGNOSIS — Z7189 Other specified counseling: Secondary | ICD-10-CM | POA: Diagnosis not present

## 2022-06-29 DIAGNOSIS — T148XXA Other injury of unspecified body region, initial encounter: Secondary | ICD-10-CM | POA: Diagnosis not present

## 2022-06-29 LAB — GLUCOSE, CAPILLARY
Glucose-Capillary: 186 mg/dL — ABNORMAL HIGH (ref 70–99)
Glucose-Capillary: 161 mg/dL — ABNORMAL HIGH (ref 70–99)
Glucose-Capillary: 218 mg/dL — ABNORMAL HIGH (ref 70–99)
Glucose-Capillary: 219 mg/dL — ABNORMAL HIGH (ref 70–99)

## 2022-06-29 LAB — CBC
HCT: 31.4 % — ABNORMAL LOW (ref 39.0–52.0)
Hemoglobin: 10 g/dL — ABNORMAL LOW (ref 13.0–17.0)
MCH: 29.2 pg (ref 26.0–34.0)
MCHC: 31.8 g/dL (ref 30.0–36.0)
MCV: 91.5 fL (ref 80.0–100.0)
Platelets: 480 10*3/uL — ABNORMAL HIGH (ref 150–400)
RBC: 3.43 MIL/uL — ABNORMAL LOW (ref 4.22–5.81)
RDW: 14 % (ref 11.5–15.5)
WBC: 14.6 10*3/uL — ABNORMAL HIGH (ref 4.0–10.5)
nRBC: 0 % (ref 0.0–0.2)

## 2022-06-29 MED ORDER — GERHARDT'S BUTT CREAM
TOPICAL_CREAM | Freq: Four times a day (QID) | CUTANEOUS | Status: DC
  Filled 2022-06-29: qty 1

## 2022-06-29 MED ORDER — IPRATROPIUM-ALBUTEROL 0.5-2.5 (3) MG/3ML IN SOLN
3.0000 mL | Freq: Two times a day (BID) | RESPIRATORY_TRACT | Status: DC
  Administered 2022-06-29 (×2): 3 mL via RESPIRATORY_TRACT
  Filled 2022-06-29 (×2): qty 3

## 2022-06-29 NOTE — Progress Notes (Signed)
Daily Progress Note   Patient Name: Raymond Sampson       Date: 06/29/2022 DOB: Oct 07, 1940  Age: 82 y.o. MRN#: [id number] Attending Physician: Sidney Ace, MD Primary Care Physician: [name], MD Admit Date: 06/19/2022  Reason for Consultation/Follow-up: Establishing goals of care  Subjective: Notes and labs reviewed. SIL is at bedside. Patient is alert and responsive. He states his pain has been much better controlled and he is waiting to work with PT/OT. SIL states he ate well for breakfast this morning.    Length of Stay: 9  Current Medications: Scheduled Meds:   aspirin EC  81 mg Oral Daily   atorvastatin  80 mg Oral Daily   clopidogrel  75 mg Oral Daily   feeding supplement  237 mL Oral TID BM   fluticasone  1 spray Each Nare Daily   Gerhardt's butt cream   Topical QID   guaiFENesin  600 mg Oral BID   insulin aspart  0-15 Units Subcutaneous TID WC   insulin aspart  0-5 Units Subcutaneous QHS   ipratropium-albuterol  3 mL Nebulization BID   levofloxacin  750 mg Oral Daily   lidocaine  1 patch Transdermal Q24H   lisinopril  20 mg Oral Daily   metoprolol tartrate  25 mg Oral BID   multivitamin with minerals  1 tablet Oral Daily   mouth rinse  15 mL Mouth Rinse 4 times per day   pantoprazole  20 mg Oral Daily   senna-docusate  1 tablet Oral BID   sertraline  25 mg Oral Daily    Continuous Infusions:   PRN Meds: acetaminophen, alum & mag hydroxide-simeth, morphine injection, nitroGLYCERIN, ondansetron **OR** ondansetron (ZOFRAN) IV, mouth rinse, oxyCODONE, polyvinyl alcohol  Physical Exam Pulmonary:     Effort: Pulmonary effort is normal.  Neurological:     Mental Status: He is alert.             Vital Signs: BP (!) 132/58 (BP Location: Right Arm)    Pulse 86   Temp 97.6 F (36.4 C) (Oral)   Resp 18   Ht 5\' 7"  (1.702 m)   Wt 66.4 kg   SpO2 95%   BMI 22.93 kg/m  SpO2: SpO2: 95 % O2 Device: O2 Device: Nasal Cannula O2 Flow Rate: O2 Flow Rate (L/min):  2 L/min  Intake/output summary:  Intake/Output Summary (Last 24 hours) at 06/29/2022 1235 Last data filed at 06/29/2022 0400 Gross per 24 hour  Intake 240 ml  Output 500 ml  Net -260 ml   LBM: Last BM Date : 06/28/22 Baseline Weight: Weight: 69.6 kg Most recent weight: Weight: 66.4 kg   Patient Active Problem List   Diagnosis Date Noted   Fall 06/22/2022   Malnutrition of moderate degree 06/22/2022   Acute blood loss anemia 06/21/2022   Multiple rib fractures involving four or more ribs, right side 06/20/2022   Intramuscular hematoma 06/19/2022   Rib fractures 06/19/2022   Hemothorax on right 06/19/2022   Leukocytosis 06/19/2022   Status post coronary artery stent placement 05/02/2022   Mild dementia ([place]) 04/14/2022   Non-ST elevation (NSTEMI) myocardial infarction ([place]) 04/13/2022   History of COVID-19 04/13/2022   Lung nodule 04/13/2022   PAD (peripheral artery disease) ([place]) 04/13/2022   Flu 06/05/2017   Diabetes mellitus type 2, controlled, with complications ([place]) 0000000   Carotid stenosis 05/14/2013   CAROTID BRUIT, LEFT 04/25/2010   PALPITATIONS 06/18/2009   Hyperlipidemia 04/27/2009   Essential hypertension 04/27/2009   Coronary artery disease of native artery of native heart with stable angina pectoris ([place]) 04/27/2009    Palliative Care Assessment & Plan    Recommendations/Plan: Patient states his pain is better controlled. Please see note from 3/27 for additional pain recommendations if needs arise.  Continue current care.   Code Status:    Code Status Orders  (From admission, onward)           Start     Ordered   06/28/22 1423  Do not attempt resuscitation (DNR)  Continuous       Question Answer Comment  If patient has no pulse and  is not breathing Do Not Attempt Resuscitation   If patient has a pulse and/or is breathing: Medical Treatment Goals LIMITED ADDITIONAL INTERVENTIONS: Use medication/IV fluids and cardiac monitoring as indicated; Do not use intubation or mechanical ventilation (DNI), also provide comfort medications.  Transfer to Progressive/Stepdown as indicated, avoid Intensive Care.   Consent: Discussion documented in EHR or advanced directives reviewed      06/28/22 1422           Code Status History     Date Active Date Inactive Code Status Order ID Comments User Context   06/19/2022 1721 06/28/2022 1422 Full Code [id number]  Deneise Lever, MD ED   05/02/2022 1135 05/03/2022 1523 Full Code [id number]  Nelva Bush, MD Inpatient   04/13/2022 0048 04/15/2022 2115 Full Code [id number]  Gertie Fey, MD ED   06/05/2017 2015 06/07/2017 1757 Full Code [id number]  Saundra Shelling, MD Inpatient        Thank you for allowing the Palliative Medicine Team to assist in the care of this patient.    Asencion Gowda, NP  Please contact Palliative Medicine Team phone at [phone] for questions and concerns.

## 2022-06-29 NOTE — Care Management Important Message (Signed)
Important Message  Patient Details  Name: Raymond Sampson MRN: [id number] Date of Birth: 02-Aug-1940   Medicare Important Message Given:  Yes     Dannette Barbara 06/29/2022, 12:47 PM

## 2022-06-29 NOTE — Progress Notes (Signed)
Physical Therapy Treatment Patient Details Name: Raymond Sampson MRN: [id number] DOB: 1940/11/15 Today's Date: 06/29/2022   History of Present Illness Raymond Sampson is an 67yoM who comes to [facility] on 06/19/22 after a fall in shower at home, hit Rt flank, orthostatic upon arrival to ED 128/66 sitting to 94/60 standing. PMH: CAD, CVA, DM, HTN. Chest CT shows 'large intramuscular hematoma' with the Rt paraspinals. ABD CT revealing fracture of Rt ribs 8-12, (9-12 are displaced), fracture of T11 Rt transverse process, and 'small right hemothorax with adjacent atelectasis or contusion in the right lower lobe.'    PT Comments    Pt pre-medicated prior to session. Able to demonstrate supine to sit with HOB raised and MaxA due to 5/10 pain with movement. Pt however did progress to sitting at edge ~5 minutes with feet and UE support and supervision. Improved ability to stand from raised bed with MaxA and attain upright posture with therapist. Pt able to weight shift and side step to bedside chair with ModA. Good functional progression noted. Pt fatigued, but easily aroused to participate. O2 removed for transfer with SpO2 dropping to 85% on RA, 2L reapplied with increase to 93%. Will continue per POC. Continue to recommend short term SNF at d/c   Recommendations for follow up therapy are one component of a multi-disciplinary discharge planning process, led by the attending physician.  Recommendations may be updated based on patient status, additional functional criteria and insurance authorization.  Follow Up Recommendations  Can patient physically be transported by private vehicle: No    Assistance Recommended at Discharge Frequent or constant Supervision/Assistance  Patient can return home with the following Two people to help with walking and/or transfers;Two people to help with bathing/dressing/bathroom;Assistance with cooking/housework;Help with stairs or ramp for entrance;Assist for  transportation;Direct supervision/assist for medications management   Equipment Recommendations  Other (comment) (TBD at next venue)    Recommendations for Other Services       Precautions / Restrictions Precautions Precautions: Fall Precaution Comments: rib fx, HOB locked in raised position for aspiration precautions Restrictions Weight Bearing Restrictions: No     Mobility  Bed Mobility Overal bed mobility: Needs Assistance Bed Mobility: Supine to Sit     Supine to sit: HOB elevated, Max assist     General bed mobility comments:  (Pt assisted more with transfer this session)    Transfers Overall transfer level: Needs assistance Equipment used: None Transfers: Sit to/from Stand, Bed to chair/wheelchair/BSC Sit to Stand: Max assist, From elevated surface Stand pivot transfers: Max assist, From elevated surface   Squat pivot transfers: Max assist, From elevated surface     General transfer comment: Pt able to attain upright standing posture    Ambulation/Gait               General Gait Details:  (No "true gait training" however able to side step in standing to bedside chair with ModA)   Stairs             Wheelchair Mobility    Modified Rankin (Stroke Patients Only)       Balance Overall balance assessment: Needs assistance Sitting-balance support: Feet supported, Bilateral upper extremity supported Sitting balance-Leahy Scale: Fair Sitting balance - Comments:  (Pt sat edge of bed for ~5 minutes initially requiring ModA to maintain, progressing to Supervision)   Standing balance support: Bilateral upper extremity supported Standing balance-Leahy Scale: Poor Standing balance comment:  (Pt able to weight shift for side stepping)  Cognition Arousal/Alertness: Awake/alert, Lethargic Behavior During Therapy: WFL for tasks assessed/performed Overall Cognitive Status: History of cognitive impairments - at  baseline                                 General Comments: Lethargic initially.        Exercises Total Joint Exercises Ankle Circles/Pumps: AROM, Strengthening, Both, 10 reps, Supine General Exercises - Lower Extremity Long Arc Quad: AROM, Both, 10 reps, Seated Heel Slides: AAROM, Both, 10 reps, Seated, Limitations    General Comments General comments (skin integrity, edema, etc.):  (Per wife, pt has breakdown on his buttocks)      Pertinent Vitals/Pain Pain Assessment Pain Assessment: 0-10 Pain Score: 5  Pain Location: R flank Pain Descriptors / Indicators: Discomfort, Dull, Grimacing, Aching, Constant Pain Intervention(s): Limited activity within patient's tolerance, Premedicated before session    Home Living                          Prior Function            PT Goals (current goals can now be found in the care plan section) Acute Rehab PT Goals Patient Stated Goal: return to mobility Progress towards PT goals: Progressing toward goals    Frequency    Min 3X/week      PT Plan Current plan remains appropriate    Co-evaluation              AM-PAC PT "6 Clicks" Mobility   Outcome Measure  Help needed turning from your back to your side while in a flat bed without using bedrails?: A Lot Help needed moving from lying on your back to sitting on the side of a flat bed without using bedrails?: A Lot Help needed moving to and from a bed to a chair (including a wheelchair)?: A Lot Help needed standing up from a chair using your arms (e.g., wheelchair or bedside chair)?: A Lot Help needed to walk in hospital room?: A Lot Help needed climbing 3-5 steps with a railing? : Total 6 Click Score: 11    End of Session Equipment Utilized During Treatment: Oxygen (2L O2 via [place]) Activity Tolerance: Patient limited by fatigue;Patient limited by lethargy;Patient limited by pain Patient left: in chair;with call bell/phone within reach;with chair  alarm set Nurse Communication: Mobility status PT Visit Diagnosis: Unsteadiness on feet (R26.81);Other abnormalities of gait and mobility (R26.89);Repeated falls (R29.6);Muscle weakness (generalized) (M62.81)     Time: [id number] PT Time Calculation (min) (ACUTE ONLY): 41 min  Charges:  $Therapeutic Exercise: 8-22 mins $Therapeutic Activity: 8-22 mins                    Mikel Cella, PTA  Josie Dixon 06/29/2022, 1:50 PM

## 2022-06-29 NOTE — Progress Notes (Signed)
PROGRESS NOTE    Raymond Sampson  [id number] DOB: July 28, 1940 DOA: 06/19/2022 PCP: [name], MD    Brief Narrative:  Raymond Sampson is a 82 y.o. male with medical history significant of coronary artery disease status post multiple stenting's with most recent stenting January 2024 x 2, carotid disease, CVA, type 2 diabetes, dementia, hyperlipidemia, hypertension, who presented to the hospital after a fall at home.  Reportedly, he slipped on soap in the tub and landed on his right side.   Workup revealed multiple right rib fractures (8 to 12), intramuscular hematoma within the right paraspinal and posterior flank musculature and small right hemothorax.  Plavix was held given 9 acute bleed.  General surgery consulted.  No surgical intervention indicated for hemothorax.  Stable appearance on imaging.  Cardiology to restart Plavix 3/22.  3/23: Patient transferred to progressive after high-sensitivity troponin 3000+ after reports of chest pain.  EKG reviewed, nonischemic.  Patient asymptomatic other than that and chest pain resolved.  Troponins trended, peaked at approximately 5000.  3/24: Palliative care met with patient and family at bedside.  At this time patient remains full scope of care.  3/28: Patient's energy level and orientation is improved this morning.  Wife remains at bedside.  Patient is having a stronger voice with well-controlled pain.  Tolerating poor p.o. intake.  Patient now DNR.   Assessment & Plan:   Principal Problem:   Intramuscular hematoma Active Problems:   Rib fractures   Status post coronary artery stent placement   Hyperlipidemia   Essential hypertension   Coronary artery disease of native artery of native heart with stable angina pectoris (HCC)   Diabetes mellitus type 2, controlled, with complications (HCC)   Hemothorax on right   Leukocytosis   Multiple rib fractures involving four or more ribs, right side   Acute blood loss anemia   Fall    Malnutrition of moderate degree  Multiple right rib fractures (8th through 12th), right T11 transverse process fracture, intramuscular hematoma within the right paraspinal and posterior flank musculature, and  right hemothorax, s/p fall: CT chest without contrast (done on 06/21/2022) showed small to moderate size right pneumothorax (increased from prior study), near complete right lower lobe atelectasis/consolidation new dependent opacities in the left lower lobe and lingula which may present atelectasis or developing pneumonia.  Clinically, doubt pneumonia at this time.  He was evaluated by Dr. Sima Matas, general surgeon.  Conservative management recommended at this time.  General surgery has signed off as of 3/22 Plan: Pain control appears to be improving.  Continue as needed pain regimen.  Continue to stress incentive spirometry and flutter valve use.  Mobility as tolerated.  Wean oxygen as tolerated.  Minimize sedating medications.  No indication for surgical intervention.  Leukocytosis Unclear etiology.  Patient now on p.o. Levaquin.  Last dose of 5-day course is scheduled for 3/29.  Recheck CBC in AM.  Consider restarting antibiotics or further infectious workup at that time.   Elevated troponin Noted on 3/22 after complaints of chest pain.  At this point EKG with ischemic appearing.  I suspect supply demand ischemia.  Patient likely not a good candidate for any kind of ischemic evaluation given multiple rib fractures, hemothorax, high risk for bleeding.  Cardiology signed off.  No further inpatient cardiac diagnostics  Acute blood loss anemia: Hemoglobin was 9.8 on admission.  Has dropped to 7.4.  Patient remains hemodynamically stable.  Stable appearance of hemothorax.  Cardiology restarted Plavix 3/22.  Transfused  1 unit PRBC on 3/22.  Hemoglobin stable.  No evidence of acute blood loss.    Acute hypoxic respiratory failure: He is on 3 L/min oxygen via [place].  Wean off oxygen as able.  Stress  incentive spirometry use.  Flutter valve use.  Patient with very poor respiratory effort     CAD s/p coronary stent in January 2024, history of stroke: Continue aspirin.  Plavix has been held because of hemothorax.  Plavix restarted 3/22.       Type II DM with hyperglycemia: NovoLog as needed.  Metformin and Januvia have been held.   Dementia: Continue supportive care  DVT prophylaxis: SCD Code Status: Full Family Communication:  spouse at bedside 3/22, 3/23, 3/24, 3/25, 3/26, 3/28 Disposition Plan: Status is: Inpatient Remains inpatient appropriate because: Acute respiratory failure.  Acute blood loss anemia.  Multiple rib fractures requiring pain control.   Level of care: Med-Surg  Consultants:  Cardiology, signed off General surgery, signed off  Procedures:  None  Antimicrobials: None   Subjective: Seen and examined.  Patient's energy level improved.  Pain control improved.  Spouse bedside  Objective: Vitals:   06/29/22 0414 06/29/22 0426 06/29/22 0840 06/29/22 1202  BP: (!) 157/69  134/66 (!) 132/58  Pulse: 94  91 86  Resp: 18  16 18   Temp: 98.3 F (36.8 C)  98.4 F (36.9 C) 97.6 F (36.4 C)  TempSrc:   Oral Oral  SpO2: 97%  96% 95%  Weight:  66.4 kg    Height:        Intake/Output Summary (Last 24 hours) at 06/29/2022 1504 Last data filed at 06/29/2022 1437 Gross per 24 hour  Intake 720 ml  Output 500 ml  Net 220 ml   Filed Weights   06/27/22 0537 06/28/22 0322 06/29/22 0426  Weight: 67.8 kg 67.5 kg 66.4 kg    Examination:  General exam: No acute distress.  Energy level improved this morning Respiratory system: Bibasilar crackles.  Poor respiratory effort.  3 L Cardiovascular system: S1-S2, RRR, no murmurs, no pedal edema Gastrointestinal system: Soft,/ND, normal bowel sounds Central nervous system: Lethargic but arousable.  Oriented.  Answers questions appropriately.  No focal deficits Extremities: Unable to assess power Skin: No rashes,  lesions or ulcers Psychiatry: Judgement and insight appear impaired. Mood & affect flattened.     Data Reviewed: I have personally reviewed following labs and imaging studies  CBC: Recent Labs  Lab 06/22/22 1525 06/23/22 0719 06/25/22 0354 06/26/22 0404 06/27/22 0514 06/28/22 0453 06/29/22 0716  WBC 13.8*   < > 14.7* 15.4* 14.8* 13.5* 14.6*  NEUTROABS 11.8*  --   --   --   --   --   --   HGB 8.3*   < > 9.2* 10.1* 9.7* 9.7* 10.0*  HCT 25.0*   < > 28.5* 31.5* 29.8* 30.4* 31.4*  MCV 91.9   < > 92.8 90.5 90.3 91.6 91.5  PLT 269   < > 308 382 368 408* 480*   < > = values in this interval not displayed.   Basic Metabolic Panel: Recent Labs  Lab 06/22/22 1525 06/23/22 1428 06/24/22 0115  NA 133* 136 134*  K 4.1 4.0 4.1  CL 104 104 100  CO2 23 24 24   GLUCOSE 219* 182* 190*  BUN 31* 38* 36*  CREATININE 1.01 1.02 0.86  CALCIUM 8.1* 8.1* 8.1*  MG  --  2.3 2.1   GFR: Estimated Creatinine Clearance: 63 mL/min (by C-G formula based on  SCr of 0.86 mg/dL). Liver Function Tests: No results for input(s): "AST", "ALT", "ALKPHOS", "BILITOT", "PROT", "ALBUMIN" in the last 168 hours.  No results for input(s): "LIPASE", "AMYLASE" in the last 168 hours. No results for input(s): "AMMONIA" in the last 168 hours. Coagulation Profile: No results for input(s): "INR", "PROTIME" in the last 168 hours. Cardiac Enzymes: No results for input(s): "CKTOTAL", "CKMB", "CKMBINDEX", "TROPONINI" in the last 168 hours. BNP (last 3 results) No results for input(s): "PROBNP" in the last 8760 hours. HbA1C: No results for input(s): "HGBA1C" in the last 72 hours. CBG: Recent Labs  Lab 06/28/22 1110 06/28/22 1636 06/28/22 2001 06/29/22 0838 06/29/22 1159  GLUCAP 220* 213* 248* 186* 161*   Lipid Profile: No results for input(s): "CHOL", "HDL", "LDLCALC", "TRIG", "CHOLHDL", "LDLDIRECT" in the last 72 hours. Thyroid Function Tests: No results for input(s): "TSH", "T4TOTAL", "FREET4", "T3FREE",  "THYROIDAB" in the last 72 hours. Anemia Panel: No results for input(s): "VITAMINB12", "FOLATE", "FERRITIN", "TIBC", "IRON", "RETICCTPCT" in the last 72 hours. Sepsis Labs: Recent Labs  Lab 06/26/22 0843  PROCALCITON 0.24    No results found for this or any previous visit (from the past 240 hour(s)).       Radiology Studies: No results found.      Scheduled Meds:  aspirin EC  81 mg Oral Daily   atorvastatin  80 mg Oral Daily   clopidogrel  75 mg Oral Daily   feeding supplement  237 mL Oral TID BM   fluticasone  1 spray Each Nare Daily   Gerhardt's butt cream   Topical QID   guaiFENesin  600 mg Oral BID   insulin aspart  0-15 Units Subcutaneous TID WC   insulin aspart  0-5 Units Subcutaneous QHS   ipratropium-albuterol  3 mL Nebulization BID   levofloxacin  750 mg Oral Daily   lidocaine  1 patch Transdermal Q24H   lisinopril  20 mg Oral Daily   metoprolol tartrate  25 mg Oral BID   multivitamin with minerals  1 tablet Oral Daily   mouth rinse  15 mL Mouth Rinse 4 times per day   pantoprazole  20 mg Oral Daily   senna-docusate  1 tablet Oral BID   sertraline  25 mg Oral Daily   Continuous Infusions:   LOS: 9 days     Sidney Ace, MD [facility]   If 7PM-7AM, please contact night-coverage  06/29/2022, 3:04 PM

## 2022-06-30 DIAGNOSIS — T148XXA Other injury of unspecified body region, initial encounter: Secondary | ICD-10-CM | POA: Diagnosis not present

## 2022-06-30 LAB — CBC
HCT: 29.8 % — ABNORMAL LOW (ref 39.0–52.0)
Hemoglobin: 9.6 g/dL — ABNORMAL LOW (ref 13.0–17.0)
MCH: 29.5 pg (ref 26.0–34.0)
MCHC: 32.2 g/dL (ref 30.0–36.0)
MCV: 91.7 fL (ref 80.0–100.0)
Platelets: 479 10*3/uL — ABNORMAL HIGH (ref 150–400)
RBC: 3.25 MIL/uL — ABNORMAL LOW (ref 4.22–5.81)
RDW: 14.1 % (ref 11.5–15.5)
WBC: 11.6 10*3/uL — ABNORMAL HIGH (ref 4.0–10.5)
nRBC: 0 % (ref 0.0–0.2)

## 2022-06-30 LAB — GLUCOSE, CAPILLARY
Glucose-Capillary: 169 mg/dL — ABNORMAL HIGH (ref 70–99)
Glucose-Capillary: 154 mg/dL — ABNORMAL HIGH (ref 70–99)
Glucose-Capillary: 210 mg/dL — ABNORMAL HIGH (ref 70–99)
Glucose-Capillary: 278 mg/dL — ABNORMAL HIGH (ref 70–99)

## 2022-06-30 LAB — BASIC METABOLIC PANEL
Anion gap: 6 (ref 5–15)
BUN: 36 mg/dL — ABNORMAL HIGH (ref 8–23)
CO2: 27 mmol/L (ref 22–32)
Calcium: 8.7 mg/dL — ABNORMAL LOW (ref 8.9–10.3)
Chloride: 101 mmol/L (ref 98–111)
Creatinine, Ser: 1 mg/dL (ref 0.61–1.24)
GFR, Estimated: >60 mL/min (ref >60)
Glucose, Bld: 205 mg/dL — ABNORMAL HIGH (ref 70–99)
Potassium: 4.2 mmol/L (ref 3.5–5.1)
Sodium: 134 mmol/L — ABNORMAL LOW (ref 135–145)

## 2022-06-30 MED ORDER — GUAIFENESIN 100 MG/5ML PO LIQD: 5.0000 mL | ORAL | Status: DC | PRN

## 2022-06-30 MED ORDER — HYDRALAZINE HCL 20 MG/ML IJ SOLN: 10.0000 mg | INTRAMUSCULAR | Status: DC | PRN

## 2022-06-30 MED ORDER — SENNOSIDES-DOCUSATE SODIUM 8.6-50 MG PO TABS: 1.0000 | ORAL_TABLET | Freq: Every evening | ORAL | Status: DC | PRN

## 2022-06-30 MED ORDER — IPRATROPIUM-ALBUTEROL 0.5-2.5 (3) MG/3ML IN SOLN
3.0000 mL | RESPIRATORY_TRACT | Status: DC | PRN
  Filled 2022-06-30: qty 3

## 2022-06-30 MED ORDER — TRAZODONE HCL 50 MG PO TABS: 50.0000 mg | ORAL_TABLET | Freq: Every evening | ORAL | Status: DC | PRN

## 2022-06-30 MED ORDER — METOPROLOL TARTRATE 5 MG/5ML IV SOLN: 5.0000 mg | INTRAVENOUS | Status: DC | PRN

## 2022-06-30 MED ORDER — OXYCODONE HCL 5 MG PO TABS
5.0000 mg | ORAL_TABLET | ORAL | Status: DC | PRN
  Administered 2022-07-01 – 2022-07-02 (×2): 5 mg via ORAL
  Filled 2022-06-30 (×2): qty 1

## 2022-06-30 NOTE — Progress Notes (Signed)
PROGRESS NOTE    Raymond Sampson  [id number] DOB: 1941/02/20 DOA: 06/19/2022 PCP: [name], MD   Brief Narrative:  82 year old with history of CAD status post multiple stents with most recent January 2024 X2, carotid disease, CVA, DM2, dementia, HLD, HTN admitted after a fall at home.  Reportedly slipped on a soap in the tub and landed on the right side.  Workup showed right-sided rib fractures 8-12, intramural hematoma within paraspinal neck musculature and small soft right-sided hemothorax.  Plavix was initially held but with cardiology recommendations he was restarted on 3/22, general surgery recommended monitoring hemothorax and hematoma.  Eventually troponin significantly trended up but EKG was nonischemic.  Patient met with palliative care service and was transition to DNR/DNI.  Overall appears very weak, PT/OT has recommended SNF.   Assessment & Plan:  Principal Problem:   Intramuscular hematoma Active Problems:   Rib fractures   Status post coronary artery stent placement   Hyperlipidemia   Essential hypertension   Coronary artery disease of native artery of native heart with stable angina pectoris (HCC)   Diabetes mellitus type 2, controlled, with complications (HCC)   Hemothorax on right   Leukocytosis   Multiple rib fractures involving four or more ribs, right side   Acute blood loss anemia   Fall   Malnutrition of moderate degree    Multiple right rib fractures (8th through 12th), right T11 transverse process fracture, intramuscular hematoma within the right paraspinal and posterior flank musculature, and  right hemothorax, s/p fall - CT chest without contrast (done on 06/21/2022) showed small to moderate size right pneumothorax (increased from prior study), near complete right lower lobe atelectasis/consolidation new dependent opacities in the left lower lobe and lingula which may present atelectasis or developing pneumonia.  Clinically, doubt pneumonia at this  time.  General surgery recommended conservative management.  For now pain control, I-S/flutter valve.  Mobilize as possible.  PT/OT has recommended SNF.   Acute hypoxic respiratory failure Suspect from atelectasis, rib fractures.  Incentive spirometer, flutter valve   Leukocytosis I suspect reactive in nature, completed 5 days of Levaquin, last day 3/29   Elevated troponin History of CAD status post stenting January 2024 Suspect demand ischemia.  Seen by cardiology who recommended resuming home medication otherwise no further diagnostics necessary Continue aspirin, Plavix, statin, metoprolol, lisinopril   Acute blood loss anemia:  Baseline hemoglobin around 9.5, initially dropped requiring 1 unit PRBC transfusion on 3/22.  Now stable  Type II DM with hyperglycemia Home medications on hold.  Sliding scale and Accu-Cheks   Dementia Continue supportive care  PT/OT = SNF  DVT prophylaxis: SCDs Start: 06/19/22 1719 Code Status: DNR Family Communication: Spouse at bedside  Status is: Inpatient Pending placement.   Signs/Symptoms: mild fat depletion, moderate fat depletion, mild muscle depletion, moderate muscle depletion  Interventions: Liberalize Diet, MVI, Ensure Enlive (each supplement provides 350kcal and 20 grams of protein)  Body mass index is 22.93 kg/m.         Subjective: Seen and examined at bedside.  No complaints Worked with occupational therapy this morning  Examination: Constitutional: Not in acute distress Respiratory: Minimal bibasilar crackles Cardiovascular: Normal sinus rhythm, no rubs Abdomen: Nontender nondistended good bowel sounds Musculoskeletal: No edema noted Skin: No rashes seen Neurologic: CN 2-12 grossly intact.  And nonfocal Psychiatric: Normal judgment and insight. Alert and oriented x 3. Normal mood.  Objective: Vitals:   06/29/22 2128 06/30/22 0010 06/30/22 0600 06/30/22 0826  BP: (!) 145/65 127/61 Marland Kitchen)  144/81 (!) 143/68  Pulse:  86 78 82 94  Resp:  18 18 18   Temp: 97.9 F (36.6 C) 97.7 F (36.5 C) (!) 97.5 F (36.4 C) 98.2 F (36.8 C)  TempSrc:      SpO2: 94% 93% 90% 92%  Weight:      Height:        Intake/Output Summary (Last 24 hours) at 06/30/2022 0841 Last data filed at 06/30/2022 [id number] Gross per 24 hour  Intake 720 ml  Output 850 ml  Net -130 ml   Filed Weights   06/27/22 0537 06/28/22 0322 06/29/22 0426  Weight: 67.8 kg 67.5 kg 66.4 kg     Data Reviewed:   CBC: Recent Labs  Lab 06/26/22 0404 06/27/22 0514 06/28/22 0453 06/29/22 0716 06/30/22 0540  WBC 15.4* 14.8* 13.5* 14.6* 11.6*  HGB 10.1* 9.7* 9.7* 10.0* 9.6*  HCT 31.5* 29.8* 30.4* 31.4* 29.8*  MCV 90.5 90.3 91.6 91.5 91.7  PLT 382 368 408* 480* [id number]*   Basic Metabolic Panel: Recent Labs  Lab 06/23/22 1428 06/24/22 0115 06/30/22 0540  NA 136 134* 134*  K 4.0 4.1 4.2  CL 104 100 101  CO2 24 24 27   GLUCOSE 182* 190* 205*  BUN 38* 36* 36*  CREATININE 1.02 0.86 1.00  CALCIUM 8.1* 8.1* 8.7*  MG 2.3 2.1  --    GFR: Estimated Creatinine Clearance: 54.2 mL/min (by C-G formula based on SCr of 1 mg/dL). Liver Function Tests: No results for input(s): "AST", "ALT", "ALKPHOS", "BILITOT", "PROT", "ALBUMIN" in the last 168 hours. No results for input(s): "LIPASE", "AMYLASE" in the last 168 hours. No results for input(s): "AMMONIA" in the last 168 hours. Coagulation Profile: No results for input(s): "INR", "PROTIME" in the last 168 hours. Cardiac Enzymes: No results for input(s): "CKTOTAL", "CKMB", "CKMBINDEX", "TROPONINI" in the last 168 hours. BNP (last 3 results) No results for input(s): "PROBNP" in the last 8760 hours. HbA1C: No results for input(s): "HGBA1C" in the last 72 hours. CBG: Recent Labs  Lab 06/29/22 0838 06/29/22 1159 06/29/22 1627 06/29/22 2057 06/30/22 0822  GLUCAP 186* 161* 218* 219* 169*   Lipid Profile: No results for input(s): "CHOL", "HDL", "LDLCALC", "TRIG", "CHOLHDL", "LDLDIRECT" in the last 72  hours. Thyroid Function Tests: No results for input(s): "TSH", "T4TOTAL", "FREET4", "T3FREE", "THYROIDAB" in the last 72 hours. Anemia Panel: No results for input(s): "VITAMINB12", "FOLATE", "FERRITIN", "TIBC", "IRON", "RETICCTPCT" in the last 72 hours. Sepsis Labs: Recent Labs  Lab 06/26/22 0843  PROCALCITON 0.24    No results found for this or any previous visit (from the past 240 hour(s)).       Radiology Studies: No results found.      Scheduled Meds:  aspirin EC  81 mg Oral Daily   atorvastatin  80 mg Oral Daily   clopidogrel  75 mg Oral Daily   feeding supplement  237 mL Oral TID BM   fluticasone  1 spray Each Nare Daily   Gerhardt's butt cream   Topical QID   guaiFENesin  600 mg Oral BID   insulin aspart  0-15 Units Subcutaneous TID WC   insulin aspart  0-5 Units Subcutaneous QHS   levofloxacin  750 mg Oral Daily   lidocaine  1 patch Transdermal Q24H   lisinopril  20 mg Oral Daily   metoprolol tartrate  25 mg Oral BID   multivitamin with minerals  1 tablet Oral Daily   mouth rinse  15 mL Mouth Rinse 4 times per day  pantoprazole  20 mg Oral Daily   senna-docusate  1 tablet Oral BID   sertraline  25 mg Oral Daily   Continuous Infusions:   LOS: 10 days   Time spent= 35 mins    Travell Desaulniers Arsenio Loader, MD [facility]  If 7PM-7AM, please contact night-coverage  06/30/2022, 8:41 AM

## 2022-06-30 NOTE — Progress Notes (Signed)
Occupational Therapy Treatment Patient Details Name: Raymond Sampson MRN: [id number] DOB: 1940/12/22 Today's Date: 06/30/2022   History of present illness Raymond Sampson is an 3yoM who comes to [facility] on 06/19/22 after a fall in shower at home, hit Rt flank, orthostatic upon arrival to ED 128/66 sitting to 94/60 standing. PMH: CAD, CVA, DM, HTN. Chest CT shows 'large intramuscular hematoma' with the Rt paraspinals. ABD CT revealing fracture of Rt ribs 8-12, (9-12 are displaced), fracture of T11 Rt transverse process, and 'small right hemothorax with adjacent atelectasis or contusion in the right lower lobe.'   OT comments  Raymond Sampson remained limited by pain and lethargy today but was nevertheless able to participate in therapy and work on increasing his sitting and standing balance tolerance and posture. He required Max A for supine<sit, Max A +2 for sit<supine and Max A + 2 for sit<>stand transfers X 3 attempts. Pt demonstrated anterior lean sitting EOB but, with multimodal cueing, was able to come into full upright seated posture for short periods of time w/o requiring physical assistance to maintain. Pt able to maintain seated balance for 20+ minutes, requiring various levels of assistance throughout. In standing, pt not able to come into full standing posture and could maintain balance for ~ 90 seconds per attempt. Pt yells and moans with any touch or movement. Pt will continue to benefit from ongoing OT throughout hospitalization.    Recommendations for follow up therapy are one component of a multi-disciplinary discharge planning process, led by the attending physician.  Recommendations may be updated based on patient status, additional functional criteria and insurance authorization.    Assistance Recommended at Discharge Frequent or constant Supervision/Assistance  Patient can return home with the following  Two people to help with walking and/or transfers;Two people to help with  bathing/dressing/bathroom;Assistance with feeding;Assistance with cooking/housework;Direct supervision/assist for medications management;Assist for transportation;Help with stairs or ramp for entrance   Equipment Recommendations  None recommended by OT    Recommendations for Other Services      Precautions / Restrictions Precautions Precautions: Fall Precaution Comments: rib fx, HOB locked in raised position for aspiration precautions Restrictions Weight Bearing Restrictions: No       Mobility Bed Mobility Overal bed mobility: Needs Assistance Bed Mobility: Supine to Sit, Sit to Supine     Supine to sit: HOB elevated, Max assist Sit to supine: Max assist, +2 for physical assistance   General bed mobility comments: Requires Max A +2 for sit<supine, for trunk + b/l LE repositioning. Total A for repositioning towards HOB in supine    Transfers Overall transfer level: Needs assistance Equipment used: Rolling walker (2 wheels) Transfers: Sit to/from Stand Sit to Stand: From elevated surface, Max assist, +2 physical assistance                 Balance Overall balance assessment: Needs assistance Sitting-balance support: Feet supported, Bilateral upper extremity supported, Single extremity supported Sitting balance-Leahy Scale: Fair Sitting balance - Comments: Mostly Mod A for maintaining sitting balance EOB, but pt able to manage INDly for short periods of time Postural control: Other (comment) (forward lean) Standing balance support: Bilateral upper extremity supported Standing balance-Leahy Scale: Poor Standing balance comment: Max A to maintain standing balance                           ADL either performed or assessed with clinical judgement   ADL Overall ADL's : Needs assistance/impaired Eating/Feeding: Moderate assistance  Eating/Feeding Details (indicate cue type and reason): able to bring spoon to mouth w/ L hand w/ verbal and tactile cueing                                         Extremity/Trunk Assessment Upper Extremity Assessment Upper Extremity Assessment: Generalized weakness   Lower Extremity Assessment Lower Extremity Assessment: Generalized weakness        Vision       Perception     Praxis      Cognition Arousal/Alertness: Awake/alert, Lethargic Behavior During Therapy: WFL for tasks assessed/performed Overall Cognitive Status: History of cognitive impairments - at baseline                                 General Comments: Lethargic        Exercises Other Exercises Other Exercises: sitting and standing balance tolerance    Shoulder Instructions       General Comments Pt moaning and crying out with any touch or movement    Pertinent Vitals/ Pain       Pain Assessment Faces Pain Scale: Hurts whole lot Pain Location: R flank Pain Descriptors / Indicators: Aching, Grimacing, Moaning Pain Intervention(s): Limited activity within patient's tolerance, Repositioned, RN gave pain meds during session  Home Living                                          Prior Functioning/Environment              Frequency  Min 2X/week        Progress Toward Goals  OT Goals(current goals can now be found in the care plan section)  Progress towards OT goals: Progressing toward goals  Acute Rehab OT Goals OT Goal Formulation: With patient Time For Goal Achievement: 07/04/22 Potential to Achieve Goals: Good  Plan      Co-evaluation                 AM-PAC OT "6 Clicks" Daily Activity     Outcome Measure   Help from another person eating meals?: A Lot Help from another person taking care of personal grooming?: A Lot Help from another person toileting, which includes using toliet, bedpan, or urinal?: A Lot Help from another person bathing (including washing, rinsing, drying)?: A Lot Help from another person to put on and taking off regular upper  body clothing?: A Little Help from another person to put on and taking off regular lower body clothing?: Total 6 Click Score: 12    End of Session Equipment Utilized During Treatment: Rolling walker (2 wheels)  OT Visit Diagnosis: Other abnormalities of gait and mobility (R26.89);Muscle weakness (generalized) (M62.81);Pain Pain - Right/Left: Right   Activity Tolerance Patient limited by pain;Patient limited by lethargy   Patient Left in bed;with call bell/phone within reach;with bed alarm set;with nursing/sitter in room;with family/visitor present   Nurse Communication          Time: [id number] OT Time Calculation (min): 28 min  Charges: OT General Charges $OT Visit: 1 Visit OT Treatments $Self Care/Home Management : 23-37 mins Josiah Lobo, PhD, MS, OTR/L 06/30/22, 9:50 AM

## 2022-06-30 NOTE — TOC Progression Note (Addendum)
Transition of Care [facility]) - Progression Note    Patient Details  Name: Raymond Sampson MRN: [id number] Date of Birth: 1941-03-06  [facility] of [facility]) CM/SW Contact  Laurena Slimmer, RN Phone Number: 06/30/2022, 1:07 PM  Clinical Narrative:    Contacted HTA.  Patient approved 3/27 for 7 days [id number] Ambulance approval [phone] MD notified.  Attempt to contact Tiffany in admission to confirm discharge for today. No answer. Left message via text.   3:57pm Per Tiffany patient can admit Monday.    Expected Discharge Plan: [place] Barriers to Discharge: Continued Medical Work up  Expected Discharge Plan and Services     Post Acute Care Choice: [place] Living arrangements for the past 2 months: Single Family Home                                       Social Determinants of Health (SDOH) Interventions SDOH Screenings   Food Insecurity: No Food Insecurity (06/19/2022)  Housing: Low Risk  (06/19/2022)  Transportation Needs: No Transportation Needs (06/19/2022)  Utilities: Not At Risk (06/19/2022)  Depression (PHQ2-9): Low Risk  (05/17/2022)  Tobacco Use: Medium Risk (06/20/2022)    Readmission Risk Interventions     No data to display

## 2022-07-01 DIAGNOSIS — T148XXA Other injury of unspecified body region, initial encounter: Secondary | ICD-10-CM | POA: Diagnosis not present

## 2022-07-01 LAB — BASIC METABOLIC PANEL
Anion gap: 8 (ref 5–15)
BUN: 30 mg/dL — ABNORMAL HIGH (ref 8–23)
CO2: 26 mmol/L (ref 22–32)
Calcium: 8.8 mg/dL — ABNORMAL LOW (ref 8.9–10.3)
Chloride: 101 mmol/L (ref 98–111)
Creatinine, Ser: 0.91 mg/dL (ref 0.61–1.24)
GFR, Estimated: >60 mL/min (ref >60)
Glucose, Bld: 192 mg/dL — ABNORMAL HIGH (ref 70–99)
Potassium: 4.3 mmol/L (ref 3.5–5.1)
Sodium: 135 mmol/L (ref 135–145)

## 2022-07-01 LAB — GLUCOSE, CAPILLARY
Glucose-Capillary: 204 mg/dL — ABNORMAL HIGH (ref 70–99)
Glucose-Capillary: 252 mg/dL — ABNORMAL HIGH (ref 70–99)
Glucose-Capillary: 213 mg/dL — ABNORMAL HIGH (ref 70–99)
Glucose-Capillary: 219 mg/dL — ABNORMAL HIGH (ref 70–99)

## 2022-07-01 LAB — CBC
HCT: 30.6 % — ABNORMAL LOW (ref 39.0–52.0)
Hemoglobin: 9.6 g/dL — ABNORMAL LOW (ref 13.0–17.0)
MCH: 28.7 pg (ref 26.0–34.0)
MCHC: 31.4 g/dL (ref 30.0–36.0)
MCV: 91.6 fL (ref 80.0–100.0)
Platelets: 503 10*3/uL — ABNORMAL HIGH (ref 150–400)
RBC: 3.34 MIL/uL — ABNORMAL LOW (ref 4.22–5.81)
RDW: 14.1 % (ref 11.5–15.5)
WBC: 13.1 10*3/uL — ABNORMAL HIGH (ref 4.0–10.5)
nRBC: 0 % (ref 0.0–0.2)

## 2022-07-01 LAB — MAGNESIUM: Magnesium: 2 mg/dL (ref 1.7–2.4)

## 2022-07-01 MED ORDER — AMLODIPINE BESYLATE 5 MG PO TABS
5.0000 mg | ORAL_TABLET | Freq: Every day | ORAL | Status: DC
  Administered 2022-07-01 – 2022-07-03 (×3): 5 mg via ORAL
  Filled 2022-07-01 (×3): qty 1

## 2022-07-01 NOTE — Progress Notes (Signed)
PROGRESS NOTE    Raymond Sampson  [id number] DOB: [birth year] DOA: 06/19/2022 PCP: [name], MD   Brief Narrative:  82 year old with history of CAD status post multiple stents with most recent January 2024 X2, carotid disease, CVA, DM2, dementia, HLD, HTN admitted after a fall at home.  Reportedly slipped on a soap in the tub and landed on the right side.  Workup showed right-sided rib fractures 8-12, intramural hematoma within paraspinal neck musculature and small soft right-sided hemothorax.  Plavix was initially held but with cardiology recommendations he was restarted on 3/22, general surgery recommended monitoring hemothorax and hematoma.  Eventually troponin significantly trended up but EKG was nonischemic.  Patient met with palliative care service and was transition to DNR/DNI.  Overall appears very weak, PT/OT has recommended SNF.  Per TOC patient may be able to go to SNF on Monday.   Assessment & Plan:  Principal Problem:   Intramuscular hematoma Active Problems:   Rib fractures   Status post coronary artery stent placement   Hyperlipidemia   Essential hypertension   Coronary artery disease of native artery of native heart with stable angina pectoris (HCC)   Diabetes mellitus type 2, controlled, with complications (HCC)   Hemothorax on right   Leukocytosis   Multiple rib fractures involving four or more ribs, right side   Acute blood loss anemia   Fall   Malnutrition of moderate degree    Multiple right rib fractures (8th through 12th), right T11 transverse process fracture, intramuscular hematoma within the right paraspinal and posterior flank musculature, and  right hemothorax, s/p fall - CT chest without contrast (done on 06/21/2022) showed small to moderate size right pneumothorax (increased from prior study), near complete right lower lobe atelectasis/consolidation new dependent opacities in the left lower lobe and lingula which may present atelectasis or  developing pneumonia.  Clinically, doubt pneumonia at this time.  General surgery recommended conservative management.  For now pain control, I-S/flutter valve.  Mobilize as possible.  PT/OT has recommended SNF.  Possible placement on Monday per TOC   Acute hypoxic respiratory failure Suspect from atelectasis, rib fractures.  Incentive spirometer, flutter valve   Leukocytosis Completed 5-day course of Levaquin   Elevated troponin History of CAD status post stenting January 2024 Suspect demand ischemia.  Seen by cardiology who recommended resuming home medication otherwise no further diagnostics necessary Continue aspirin, Plavix, statin, metoprolol, lisinopril  Essential hypertension - Already on metoprolol and lisinopril.  BP still elevated, will add Norvasc 5 mg daily   Acute blood loss anemia:  Baseline hemoglobin around 9.5, initially dropped requiring 1 unit PRBC transfusion on 3/22.  Now stable  Type II DM with hyperglycemia Home medications on hold.  Sliding scale and Accu-Cheks   Dementia Continue supportive care  PT/OT = SNF  DVT prophylaxis: SCDs Start: 06/19/22 1719 Code Status: DNR Family Communication: Spouse at bedside  Status is: Inpatient Pending placement.  Anticipate Monday.  He is  Signs/Symptoms: mild fat depletion, moderate fat depletion, mild muscle depletion, moderate muscle depletion  Interventions: Liberalize Diet, MVI, Ensure Enlive (each supplement provides 350kcal and 20 grams of protein)  Body mass index is 22.31 kg/m.         Subjective: Sitting up in the recliner eating his breakfast   Examination: Constitutional: Not in acute distress frail  Respiratory: Clear to auscultation bilaterally Cardiovascular: Normal sinus rhythm, no rubs Abdomen: Nontender nondistended good bowel sounds Musculoskeletal: No edema noted Skin: No rashes seen Neurologic: CN 2-12 grossly  intact.  And nonfocal Psychiatric: Normal judgment and insight.  Alert and oriented x 3. Normal mood.   Objective: Vitals:   06/30/22 1559 06/30/22 1945 06/30/22 2330 07/01/22 0408  BP: (!) 138/58 (!) 145/59 (!) 166/70 (!) 164/69  Pulse: 85 85 78 81  Resp: 18 18 18 16   Temp: 98.2 F (36.8 C) 98.7 F (37.1 C) 98.2 F (36.8 C) 98.2 F (36.8 C)  TempSrc: Oral     SpO2: 93% 93% 94% 91%  Weight:    64.6 kg  Height:        Intake/Output Summary (Last 24 hours) at 07/01/2022 0843 Last data filed at 07/01/2022 0440 Gross per 24 hour  Intake 720 ml  Output 900 ml  Net -180 ml   Filed Weights   06/28/22 0322 06/29/22 0426 07/01/22 0408  Weight: 67.5 kg 66.4 kg 64.6 kg     Data Reviewed:   CBC: Recent Labs  Lab 06/27/22 0514 06/28/22 0453 06/29/22 0716 06/30/22 0540 07/01/22 0400  WBC 14.8* 13.5* 14.6* 11.6* 13.1*  HGB 9.7* 9.7* 10.0* 9.6* 9.6*  HCT 29.8* 30.4* 31.4* 29.8* 30.6*  MCV 90.3 91.6 91.5 91.7 91.6  PLT 368 408* 480* 479* [id number]*   Basic Metabolic Panel: Recent Labs  Lab 06/30/22 0540 07/01/22 0400  NA 134* 135  K 4.2 4.3  CL 101 101  CO2 27 26  GLUCOSE 205* 192*  BUN 36* 30*  CREATININE 1.00 0.91  CALCIUM 8.7* 8.8*  MG  --  2.0   GFR: Estimated Creatinine Clearance: 58.2 mL/min (by C-G formula based on SCr of 0.91 mg/dL). Liver Function Tests: No results for input(s): "AST", "ALT", "ALKPHOS", "BILITOT", "PROT", "ALBUMIN" in the last 168 hours. No results for input(s): "LIPASE", "AMYLASE" in the last 168 hours. No results for input(s): "AMMONIA" in the last 168 hours. Coagulation Profile: No results for input(s): "INR", "PROTIME" in the last 168 hours. Cardiac Enzymes: No results for input(s): "CKTOTAL", "CKMB", "CKMBINDEX", "TROPONINI" in the last 168 hours. BNP (last 3 results) No results for input(s): "PROBNP" in the last 8760 hours. HbA1C: No results for input(s): "HGBA1C" in the last 72 hours. CBG: Recent Labs  Lab 06/29/22 2057 06/30/22 0822 06/30/22 1201 06/30/22 1557 06/30/22 2030  GLUCAP 219*  169* 154* 210* 278*   Lipid Profile: No results for input(s): "CHOL", "HDL", "LDLCALC", "TRIG", "CHOLHDL", "LDLDIRECT" in the last 72 hours. Thyroid Function Tests: No results for input(s): "TSH", "T4TOTAL", "FREET4", "T3FREE", "THYROIDAB" in the last 72 hours. Anemia Panel: No results for input(s): "VITAMINB12", "FOLATE", "FERRITIN", "TIBC", "IRON", "RETICCTPCT" in the last 72 hours. Sepsis Labs: Recent Labs  Lab 06/26/22 0843  PROCALCITON 0.24    No results found for this or any previous visit (from the past 240 hour(s)).       Radiology Studies: No results found.      Scheduled Meds:  aspirin EC  81 mg Oral Daily   atorvastatin  80 mg Oral Daily   clopidogrel  75 mg Oral Daily   feeding supplement  237 mL Oral TID BM   fluticasone  1 spray Each Nare Daily   Gerhardt's butt cream   Topical QID   guaiFENesin  600 mg Oral BID   insulin aspart  0-15 Units Subcutaneous TID WC   insulin aspart  0-5 Units Subcutaneous QHS   lidocaine  1 patch Transdermal Q24H   lisinopril  20 mg Oral Daily   metoprolol tartrate  25 mg Oral BID   multivitamin with minerals  1  tablet Oral Daily   mouth rinse  15 mL Mouth Rinse 4 times per day   pantoprazole  20 mg Oral Daily   senna-docusate  1 tablet Oral BID   sertraline  25 mg Oral Daily   Continuous Infusions:   LOS: 11 days   Time spent= 35 mins    Raymond Sampson Arsenio Loader, MD [facility]  If 7PM-7AM, please contact night-coverage  07/01/2022, 8:43 AM

## 2022-07-02 ENCOUNTER — Inpatient Hospital Stay: Payer: PPO

## 2022-07-02 DIAGNOSIS — T148XXA Other injury of unspecified body region, initial encounter: Secondary | ICD-10-CM | POA: Diagnosis not present

## 2022-07-02 LAB — CBC
HCT: 31.1 % — ABNORMAL LOW (ref 39.0–52.0)
Hemoglobin: 9.9 g/dL — ABNORMAL LOW (ref 13.0–17.0)
MCH: 28.9 pg (ref 26.0–34.0)
MCHC: 31.8 g/dL (ref 30.0–36.0)
MCV: 90.7 fL (ref 80.0–100.0)
Platelets: 526 10*3/uL — ABNORMAL HIGH (ref 150–400)
RBC: 3.43 MIL/uL — ABNORMAL LOW (ref 4.22–5.81)
RDW: 14.3 % (ref 11.5–15.5)
WBC: 12.8 10*3/uL — ABNORMAL HIGH (ref 4.0–10.5)
nRBC: 0 % (ref 0.0–0.2)

## 2022-07-02 LAB — BLOOD GAS, ARTERIAL
Acid-Base Excess: 5 mmol/L — ABNORMAL HIGH (ref 0.0–2.0)
Bicarbonate: 29.1 mmol/L — ABNORMAL HIGH (ref 20.0–28.0)
O2 Saturation: 97.1 %
Patient temperature: 37
pCO2 arterial: 40 mmHg (ref 32–48)
pH, Arterial: 7.47 — ABNORMAL HIGH (ref 7.35–7.45)
pO2, Arterial: 74 mmHg — ABNORMAL LOW (ref 83–108)

## 2022-07-02 LAB — GLUCOSE, CAPILLARY
Glucose-Capillary: 186 mg/dL — ABNORMAL HIGH (ref 70–99)
Glucose-Capillary: 190 mg/dL — ABNORMAL HIGH (ref 70–99)
Glucose-Capillary: 234 mg/dL — ABNORMAL HIGH (ref 70–99)
Glucose-Capillary: 264 mg/dL — ABNORMAL HIGH (ref 70–99)

## 2022-07-02 LAB — MAGNESIUM: Magnesium: 2 mg/dL (ref 1.7–2.4)

## 2022-07-02 MED ORDER — IPRATROPIUM-ALBUTEROL 0.5-2.5 (3) MG/3ML IN SOLN
3.0000 mL | Freq: Two times a day (BID) | RESPIRATORY_TRACT | Status: DC
  Administered 2022-07-02 – 2022-07-03 (×2): 3 mL via RESPIRATORY_TRACT
  Filled 2022-07-02: qty 3

## 2022-07-02 MED ORDER — FUROSEMIDE 10 MG/ML IJ SOLN
20.0000 mg | Freq: Once | INTRAMUSCULAR | Status: AC
  Administered 2022-07-02: 20 mg via INTRAVENOUS
  Filled 2022-07-02: qty 2

## 2022-07-02 NOTE — Progress Notes (Signed)
PROGRESS NOTE    Raymond Sampson  [id number] DOB: [birth year] DOA: 06/19/2022 PCP: [name], MD   Brief Narrative:  82 year old with history of CAD status post multiple stents with most recent January 2024 X2, carotid disease, CVA, DM2, dementia, HLD, HTN admitted after a fall at home.  Reportedly slipped on a soap in the tub and landed on the right side.  Workup showed right-sided rib fractures 8-12, intramural hematoma within paraspinal neck musculature and small soft right-sided hemothorax.  Plavix was initially held but with cardiology recommendations he was restarted on 3/22, general surgery recommended monitoring hemothorax and hematoma.  Eventually troponin significantly trended up but EKG was nonischemic.  Patient met with palliative care service and was transition to DNR/DNI.  Overall appears very weak, PT/OT has recommended SNF.  Per TOC patient may be able to go to SNF on Monday.   Assessment & Plan:  Principal Problem:   Intramuscular hematoma Active Problems:   Rib fractures   Status post coronary artery stent placement   Hyperlipidemia   Essential hypertension   Coronary artery disease of native artery of native heart with stable angina pectoris (HCC)   Diabetes mellitus type 2, controlled, with complications (HCC)   Hemothorax on right   Leukocytosis   Multiple rib fractures involving four or more ribs, right side   Acute blood loss anemia   Fall   Malnutrition of moderate degree    Multiple right rib fractures (8th through 12th), right T11 transverse process fracture, intramuscular hematoma within the right paraspinal and posterior flank musculature, and  right hemothorax, s/p fall - CT chest without contrast (done on 06/21/2022) showed small to moderate size right pneumothorax (increased from prior study), near complete right lower lobe atelectasis/consolidation new dependent opacities in the left lower lobe and lingula which may present atelectasis or  developing pneumonia.  Clinically, doubt pneumonia at this time.  General surgery recommended conservative management.  For now pain control, I-S/flutter valve.  Mobilize as possible.  PT/OT has recommended SNF. Placement on Monday.    Acute hypoxic respiratory failure Suspect from atelectasis, rib fractures.  Incentive spirometer, flutter valve   Leukocytosis Completed 5-day course of Levaquin   Elevated troponin History of CAD status post stenting January 2024 Suspect demand ischemia.  Seen by cardiology who recommended resuming home medication otherwise no further diagnostics necessary Continue aspirin, Plavix, statin, metoprolol, lisinopril  Essential hypertension - Already on metoprolol and lisinopril.  Added norvasc 5mg  daily. BP better.    Acute blood loss anemia:  Baseline hemoglobin around 9.5, initially dropped requiring 1 unit PRBC transfusion on 3/22.  Now stable  Type II DM with hyperglycemia Home medications on hold.  Sliding scale and Accu-Cheks   Dementia Continue supportive care  PT/OT = SNF  DVT prophylaxis: SCDs Start: 06/19/22 1719 Code Status: DNR Family Communication: Spouse at bedside  Status is: Inpatient Pending placement.  Anticipate Monday.  He is  Signs/Symptoms: mild fat depletion, moderate fat depletion, mild muscle depletion, moderate muscle depletion  Interventions: Liberalize Diet, MVI, Ensure Enlive (each supplement provides 350kcal and 20 grams of protein)  Body mass index is 22.31 kg/m.         Subjective: Sleeping comfortably.  No complaints  Examination: Constitutional: Not in acute distress Respiratory: Clear to auscultation bilaterally Cardiovascular: Normal sinus rhythm, no rubs Abdomen: Nontender nondistended good bowel sounds Musculoskeletal: No edema noted Skin: No rashes seen Neurologic: CN 2-12 grossly intact.  And nonfocal Psychiatric: Normal judgment and insight. Alert and  oriented x 3. Normal  mood.  Objective: Vitals:   07/01/22 1543 07/01/22 1958 07/02/22 0427 07/02/22 0810  BP: (!) 107/55 (!) 118/51 (!) 133/56 (!) 135/57  Pulse: 79 84 83 78  Resp: 16 18 18    Temp: 98.7 F (37.1 C) (!) 97.5 F (36.4 C) 98.1 F (36.7 C) 97.6 F (36.4 C)  TempSrc: Axillary Axillary  Oral  SpO2: 94% 94% 92% 90%  Weight:      Height:        Intake/Output Summary (Last 24 hours) at 07/02/2022 0818 Last data filed at 07/02/2022 0600 Gross per 24 hour  Intake 480 ml  Output 825 ml  Net -345 ml   Filed Weights   06/28/22 0322 06/29/22 0426 07/01/22 0408  Weight: 67.5 kg 66.4 kg 64.6 kg     Data Reviewed:   CBC: Recent Labs  Lab 06/28/22 0453 06/29/22 0716 06/30/22 0540 07/01/22 0400 07/02/22 0439  WBC 13.5* 14.6* 11.6* 13.1* 12.8*  HGB 9.7* 10.0* 9.6* 9.6* 9.9*  HCT 30.4* 31.4* 29.8* 30.6* 31.1*  MCV 91.6 91.5 91.7 91.6 90.7  PLT 408* 480* 479* 503* 0000000*   Basic Metabolic Panel: Recent Labs  Lab 06/30/22 0540 07/01/22 0400 07/02/22 0439  NA 134* 135  --   K 4.2 4.3  --   CL 101 101  --   CO2 27 26  --   GLUCOSE 205* 192*  --   BUN 36* 30*  --   CREATININE 1.00 0.91  --   CALCIUM 8.7* 8.8*  --   MG  --  2.0 2.0   GFR: Estimated Creatinine Clearance: 58.2 mL/min (by C-G formula based on SCr of 0.91 mg/dL). Liver Function Tests: No results for input(s): "AST", "ALT", "ALKPHOS", "BILITOT", "PROT", "ALBUMIN" in the last 168 hours. No results for input(s): "LIPASE", "AMYLASE" in the last 168 hours. No results for input(s): "AMMONIA" in the last 168 hours. Coagulation Profile: No results for input(s): "INR", "PROTIME" in the last 168 hours. Cardiac Enzymes: No results for input(s): "CKTOTAL", "CKMB", "CKMBINDEX", "TROPONINI" in the last 168 hours. BNP (last 3 results) No results for input(s): "PROBNP" in the last 8760 hours. HbA1C: No results for input(s): "HGBA1C" in the last 72 hours. CBG: Recent Labs  Lab 07/01/22 0931 07/01/22 1229 07/01/22 1542  07/01/22 2033 07/02/22 0734  GLUCAP 204* 252* 213* 219* 186*   Lipid Profile: No results for input(s): "CHOL", "HDL", "LDLCALC", "TRIG", "CHOLHDL", "LDLDIRECT" in the last 72 hours. Thyroid Function Tests: No results for input(s): "TSH", "T4TOTAL", "FREET4", "T3FREE", "THYROIDAB" in the last 72 hours. Anemia Panel: No results for input(s): "VITAMINB12", "FOLATE", "FERRITIN", "TIBC", "IRON", "RETICCTPCT" in the last 72 hours. Sepsis Labs: Recent Labs  Lab 06/26/22 0843  PROCALCITON 0.24    No results found for this or any previous visit (from the past 240 hour(s)).       Radiology Studies: No results found.      Scheduled Meds:  amLODipine  5 mg Oral Daily   aspirin EC  81 mg Oral Daily   atorvastatin  80 mg Oral Daily   clopidogrel  75 mg Oral Daily   feeding supplement  237 mL Oral TID BM   fluticasone  1 spray Each Nare Daily   Gerhardt's butt cream   Topical QID   guaiFENesin  600 mg Oral BID   insulin aspart  0-15 Units Subcutaneous TID WC   insulin aspart  0-5 Units Subcutaneous QHS   lidocaine  1 patch Transdermal Q24H  lisinopril  20 mg Oral Daily   metoprolol tartrate  25 mg Oral BID   multivitamin with minerals  1 tablet Oral Daily   mouth rinse  15 mL Mouth Rinse 4 times per day   pantoprazole  20 mg Oral Daily   senna-docusate  1 tablet Oral BID   sertraline  25 mg Oral Daily   Continuous Infusions:   LOS: 12 days   Time spent= 35 mins    Uzziah Rigg Arsenio Loader, MD [facility]  If 7PM-7AM, please contact night-coverage  07/02/2022, 8:18 AM

## 2022-07-02 NOTE — Progress Notes (Signed)
Physical Therapy Treatment Patient Details Name: Raymond Sampson MRN: [id number] DOB: [birth year] Today's Date: 07/02/2022   History of Present Illness Raymond Sampson is an 70yoM who comes to [facility] on 06/19/22 after a fall in shower at home, hit Rt flank, orthostatic upon arrival to ED 128/66 sitting to 94/60 standing. PMH: CAD, CVA, DM, HTN. Chest CT shows 'large intramuscular hematoma' with the Rt paraspinals. ABD CT revealing fracture of Rt ribs 8-12, (9-12 are displaced), fracture of T11 Rt transverse process, and 'small right hemothorax with adjacent atelectasis or contusion in the right lower lobe.'    PT Comments    Pt lethargic today but family in room motivated for pt to get up to chair and participate in session.  He is assisted to EOB with mod/a x 2 and once up he does awaken but continues to need min a to remain sitting.  He is able to stand with mod a x 2 to stand pivot to recliner at bedside.  No true gait but tolerated transfer well and is awake enough to participate in [place].  Remained in recliner with needs met and safety precautions in place.   Recommendations for follow up therapy are one component of a multi-disciplinary discharge planning process, led by the attending physician.  Recommendations may be updated based on patient status, additional functional criteria and insurance authorization.  Follow Up Recommendations       Assistance Recommended at Discharge Frequent or constant Supervision/Assistance  Patient can return home with the following Two people to help with walking and/or transfers;Two people to help with bathing/dressing/bathroom;Assistance with cooking/housework;Help with stairs or ramp for entrance;Assist for transportation;Direct supervision/assist for medications management   Equipment Recommendations       Recommendations for Other Services       Precautions / Restrictions Precautions Precautions: Fall Precaution Comments: rib fx, HOB locked in  raised position for aspiration precautions Restrictions Weight Bearing Restrictions: No     Mobility  Bed Mobility Overal bed mobility: Needs Assistance Bed Mobility: Supine to Sit     Supine to sit: Mod assist, Max assist, +2 for physical assistance          Transfers Overall transfer level: Needs assistance Equipment used: Rolling walker (2 wheels)   Sit to Stand: Mod assist, +2 physical assistance Stand pivot transfers: Mod assist, +2 physical assistance         General transfer comment: relatively easy transfer +2 to recliner at EOB.  pt does assist but does not take any true steps    Ambulation/Gait                   Stairs             Wheelchair Mobility    Modified Rankin (Stroke Patients Only)       Balance Overall balance assessment: Needs assistance Sitting-balance support: Feet supported, Bilateral upper extremity supported, Single extremity supported Sitting balance-Leahy Scale: Fair     Standing balance support: Bilateral upper extremity supported Standing balance-Leahy Scale: Poor                              Cognition Arousal/Alertness: Awake/alert Behavior During Therapy: WFL for tasks assessed/performed Overall Cognitive Status: History of cognitive impairments - at baseline  Exercises Other Exercises Other Exercises: seated AAROM BLE    General Comments        Pertinent Vitals/Pain Pain Assessment Pain Assessment: Faces Faces Pain Scale: Hurts even more Pain Location: R flank Pain Descriptors / Indicators: Aching, Grimacing, Moaning Pain Intervention(s): Limited activity within patient's tolerance, Monitored during session, Repositioned    Home Living                          Prior Function            PT Goals (current goals can now be found in the care plan section) Progress towards PT goals: Progressing toward goals     Frequency    Min 3X/week      PT Plan Current plan remains appropriate    Co-evaluation              AM-PAC PT "6 Clicks" Mobility   Outcome Measure  Help needed turning from your back to your side while in a flat bed without using bedrails?: A Lot Help needed moving from lying on your back to sitting on the side of a flat bed without using bedrails?: A Lot Help needed moving to and from a bed to a chair (including a wheelchair)?: A Lot Help needed standing up from a chair using your arms (e.g., wheelchair or bedside chair)?: A Lot Help needed to walk in hospital room?: Total Help needed climbing 3-5 steps with a railing? : Total 6 Click Score: 10    End of Session Equipment Utilized During Treatment: Gait belt Activity Tolerance: Patient limited by fatigue Patient left: in chair;with call bell/phone within reach;with chair alarm set Nurse Communication: Mobility status PT Visit Diagnosis: Unsteadiness on feet (R26.81);Other abnormalities of gait and mobility (R26.89);Repeated falls (R29.6);Muscle weakness (generalized) (M62.81)     Time: [id number] PT Time Calculation (min) (ACUTE ONLY): 8 min  Charges:  $Therapeutic Activity: 8-22 mins                   Chesley Noon, PTA 07/02/22, 12:12 PM

## 2022-07-03 DIAGNOSIS — T148XXA Other injury of unspecified body region, initial encounter: Secondary | ICD-10-CM | POA: Diagnosis not present

## 2022-07-03 LAB — GLUCOSE, CAPILLARY
Glucose-Capillary: 169 mg/dL — ABNORMAL HIGH (ref 70–99)
Glucose-Capillary: 189 mg/dL — ABNORMAL HIGH (ref 70–99)

## 2022-07-03 MED ORDER — ENSURE ENLIVE PO LIQD: 237.0000 mL | Freq: Three times a day (TID) | ORAL | 12 refills | Status: DC

## 2022-07-03 MED ORDER — SENNOSIDES-DOCUSATE SODIUM 8.6-50 MG PO TABS: 2.0000 | ORAL_TABLET | Freq: Every evening | ORAL | Status: DC | PRN

## 2022-07-03 MED ORDER — OXYCODONE HCL 5 MG PO TABS: 2.5000 mg | ORAL_TABLET | Freq: Four times a day (QID) | ORAL | 0 refills | Status: DC | PRN

## 2022-07-03 MED ORDER — AMLODIPINE BESYLATE 5 MG PO TABS: 5.0000 mg | ORAL_TABLET | Freq: Every day | ORAL | Status: DC

## 2022-07-03 NOTE — TOC Transition Note (Signed)
Transition of Care [facility]) - CM/SW Discharge Note   Patient Details  Name: Raymond Sampson MRN: [id number] Date of Birth: 20-Sep-1940  Transition of Care [facility]) CM/SW Contact:  Laurena Slimmer, RN Phone Number: 07/03/2022, 12:04 PM   Clinical Narrative:    Patient assigned room # 601 Nurse will call report to [phone] Patient, MD, nurse, and family notified.  Face sheet and medical necessity forms printed to the floor to be added to the EMS pack EMS arranged  Discharge summary and SNF tranfer report sent in [place].  TOC signing off.      Barriers to Discharge: Continued Medical Work up   Patient Goals and CMS Choice      Discharge Placement                         Discharge Plan and Services Additional resources added to the After Visit Summary for       Post Acute Care Choice: [place]                               Social Determinants of Health (SDOH) Interventions SDOH Screenings   Food Insecurity: No Food Insecurity (06/19/2022)  Housing: [place]  (06/19/2022)  Transportation Needs: No Transportation Needs (06/19/2022)  Utilities: Not At Risk (06/19/2022)  Depression (PHQ2-9): Low Risk  (05/17/2022)  Tobacco Use: Medium Risk (06/20/2022)     Readmission Risk Interventions     No data to display

## 2022-07-03 NOTE — TOC Progression Note (Signed)
Transition of Care [facility]) - Progression Note    Patient Details  Name: Raymond Sampson MRN: [id number] Date of Birth: [birth year]  [facility]) CM/SW Contact  Laurena Slimmer, RN Phone Number: 07/03/2022, 10:04 AM  Clinical Narrative:    Per Tiffany in admissions at [facility], patient can admit today.  MD and nurse notified.    Expected Discharge Plan: [place] Barriers to Discharge: Continued Medical Work up  Expected Discharge Plan and Services     Post Acute Care Choice: [place] Living arrangements for the past 2 months: Single Family Home                                       Social Determinants of Health (SDOH) Interventions SDOH Screenings   Food Insecurity: No Food Insecurity (06/19/2022)  Housing: Low Risk  (06/19/2022)  Transportation Needs: No Transportation Needs (06/19/2022)  Utilities: Not At Risk (06/19/2022)  Depression (PHQ2-9): Low Risk  (05/17/2022)  Tobacco Use: Medium Risk (06/20/2022)    Readmission Risk Interventions     No data to display

## 2022-07-03 NOTE — Care Management Important Message (Signed)
Important Message  Patient Details  Name: Raymond Sampson MRN: [id number] Date of Birth: [birth year]   Medicare Important Message Given:  Yes     Dannette Barbara 07/03/2022, 12:48 PM

## 2022-07-03 NOTE — Discharge Summary (Signed)
Physician Discharge Summary  Raymond Sampson [id number] DOB: 01-23-1941 DOA: 06/19/2022  PCP: [name], MD  Admit date: 06/19/2022 Discharge date: 07/03/2022  Admitted From: [facility] Disposition:  SNF  Recommendations for Outpatient Follow-up:  Follow up with PCP in 1-2 weeks Please obtain BMP/CBC in one week your next doctors visit.  Norvasc 5 mg daily started Oxycodone pain for moderate to severe pain.  Bowel regimen prescribed   Discharge Condition: Stable CODE STATUS: DNR Diet recommendation: Heart healthy  Brief/Interim Summary:  82 year old with history of CAD status post multiple stents with most recent January 2024 X2, carotid disease, CVA, DM2, dementia, HLD, HTN admitted after a fall at home.  Reportedly slipped on a soap in the tub and landed on the right side.  Workup showed right-sided rib fractures 8-12, intramural hematoma within paraspinal neck musculature and small soft right-sided hemothorax.  Plavix was initially held but with cardiology recommendations he was restarted on 3/22, general surgery recommended monitoring hemothorax and hematoma.  Eventually troponin significantly trended up but EKG was nonischemic.  Patient met with palliative care service and was transition to DNR/DNI.  Overall appears very weak, PT/OT has recommended SNF.  Placement available for patient today.  Rest of the management and recommendations as mentioned above.     Assessment & Plan:  Principal Problem:   Intramuscular hematoma Active Problems:   Rib fractures   Status post coronary artery stent placement   Hyperlipidemia   Essential hypertension   Coronary artery disease of native artery of native heart with stable angina pectoris (HCC)   Diabetes mellitus type 2, controlled, with complications (HCC)   Hemothorax on right   Leukocytosis   Multiple rib fractures involving four or more ribs, right side   Acute blood loss anemia   Fall   Malnutrition of moderate degree     Multiple right rib fractures (8th through 12th), right T11 transverse process fracture, intramuscular hematoma within the right paraspinal and posterior flank musculature, and  right hemothorax, s/p fall - CT chest without contrast (done on 06/21/2022) showed small to moderate size right pneumothorax (increased from prior study), near complete right lower lobe atelectasis/consolidation new dependent opacities in the left lower lobe and lingula which may present atelectasis or developing pneumonia.  Clinically, doubt pneumonia at this time.  General surgery recommended conservative management.  For now pain control, I-S/flutter valve.  Mobilize as possible.  PT/OT has recommended SNF.  Placement available today    Acute hypoxic respiratory failure Suspect from atelectasis, rib fractures.  Incentive spirometer, flutter valve   Leukocytosis Completed 5-day course of Levaquin   Elevated troponin History of CAD status post stenting January 2024 Suspect demand ischemia.  Seen by cardiology who recommended resuming home medication otherwise no further diagnostics necessary Continue aspirin, Plavix, statin, metoprolol, lisinopril   Essential hypertension - Already on metoprolol and lisinopril.  Added norvasc 5mg  daily. BP better.    Acute blood loss anemia:  Baseline hemoglobin around 9.5, initially dropped requiring 1 unit PRBC transfusion on 3/22.  Now stable   Type II DM with hyperglycemia Resume home regimen   Dementia Continue supportive care   PT/OT = SNF      Discharge Diagnoses:  Principal Problem:   Intramuscular hematoma Active Problems:   Rib fractures   Status post coronary artery stent placement   Hyperlipidemia   Essential hypertension   Coronary artery disease of native artery of native heart with stable angina pectoris   Diabetes mellitus type 2, controlled, with  complications   Hemothorax on right   Leukocytosis   Multiple rib fractures involving four or more  ribs, right side   Acute blood loss anemia   Fall   Malnutrition of moderate degree      Consultations: Palliative care General surgery Cardiology  Subjective: Feeling well no complaints.  Wife present at bedside  Discharge Exam: Vitals:   07/03/22 0752 07/03/22 0842  BP:  135/63  Pulse:  82  Resp:  18  Temp:  97.9 F (36.6 C)  SpO2: 90% 90%   Vitals:   07/02/22 2351 07/03/22 0312 07/03/22 0752 07/03/22 0842  BP: (!) 153/63 (!) 152/68  135/63  Pulse: 73 72  82  Resp: 18 18  18   Temp: 98.2 F (36.8 C) 98 F (36.7 C)  97.9 F (36.6 C)  TempSrc:      SpO2: 96% 93% 90% 90%  Weight:      Height:        General: Pt is alert, awake, not in acute distress, elderly frail Cardiovascular: RRR, S1/S2 +, no rubs, no gallops Respiratory: CTA bilaterally, no wheezing, no rhonchi Abdominal: Soft, NT, ND, bowel sounds + Extremities: no edema, no cyanosis  Discharge Instructions   Allergies as of 07/03/2022       Reactions   Metformin And Related Itching   Erectile dysfunction   Simvastatin Rash   rash        Medication List     TAKE these medications    amLODipine 5 MG tablet Commonly known as: NORVASC Take 1 tablet (5 mg total) by mouth daily.   aspirin 81 MG tablet Take 81 mg by mouth daily.   atorvastatin 80 MG tablet Commonly known as: LIPITOR Take 1 tablet (80 mg total) by mouth daily.   clopidogrel 75 MG tablet Commonly known as: PLAVIX TAKE 1 TABLET BY MOUTH EVERY DAY WITH BREAKFAST   cyanocobalamin 1000 MCG tablet Take 1,000 mcg by mouth daily.   feeding supplement Liqd Take 237 mLs by mouth 3 (three) times daily between meals.   lisinopril 20 MG tablet Commonly known as: ZESTRIL TAKE 1 TABLET BY MOUTH EVERY DAY   metFORMIN 1000 MG tablet Commonly known as: GLUCOPHAGE Take 1 tablet (1,000 mg total) by mouth 2 (two) times daily with a meal. Resume 05/05/2022   metoprolol tartrate 25 MG tablet Commonly known as: LOPRESSOR TAKE 1  TABLET BY MOUTH TWICE A DAY   oxyCODONE 5 MG immediate release tablet Commonly known as: Oxy IR/ROXICODONE Take 0.5-1 tablets (2.5-5 mg total) by mouth every 6 (six) hours as needed for severe pain or moderate pain.   pantoprazole 20 MG tablet Commonly known as: PROTONIX Take 1 tablet (20 mg total) by mouth daily.   senna-docusate 8.6-50 MG tablet Commonly known as: Senokot-S Take 2 tablets by mouth at bedtime as needed for moderate constipation.   sertraline 25 MG tablet Commonly known as: ZOLOFT Take 1 tablet (25 mg total) by mouth daily.   sitaGLIPtin 25 MG tablet Commonly known as: JANUVIA Take 1 tablet by mouth daily.        Follow-up Information     [name], MD Follow up in 1 week(s).   Specialty: Internal Medicine Contact information: [place] [address] [phone]         [name], MD Follow up in 1 week(s).   Specialty: Internal Medicine Contact information: [place]  [id number] [phone]  Allergies  Allergen Reactions   Metformin And Related Itching    Erectile dysfunction   Simvastatin Rash    rash    You were cared for by a hospitalist during your hospital stay. If you have any questions about your discharge medications or the care you received while you were in the hospital after you are discharged, you can call the unit and asked to speak with the hospitalist on call if the hospitalist that took care of you is not available. Once you are discharged, your primary care physician will handle any further medical issues. Please note that no refills for any discharge medications will be authorized once you are discharged, as it is imperative that you return to your primary care physician (or establish a relationship with a primary care physician if you do not have one) for your aftercare needs so that they can reassess your need for medications and monitor your lab  values.  You were cared for by a hospitalist during your hospital stay. If you have any questions about your discharge medications or the care you received while you were in the hospital after you are discharged, you can call the unit and asked to speak with the hospitalist on call if the hospitalist that took care of you is not available. Once you are discharged, your primary care physician will handle any further medical issues. Please note that NO REFILLS for any discharge medications will be authorized once you are discharged, as it is imperative that you return to your primary care physician (or establish a relationship with a primary care physician if you do not have one) for your aftercare needs so that they can reassess your need for medications and monitor your lab values.  Please request your Prim.MD to go over all Hospital Tests and Procedure/Radiological results at the follow up, please get all Hospital records sent to your Prim MD by signing hospital release before you go home.  Get CBC, CMP, 2 view Chest X ray checked  by Primary MD during your next visit or SNF MD in 5-7 days ( we routinely change or add medications that can affect your baseline labs and fluid status, therefore we recommend that you get the mentioned basic workup next visit with your PCP, your PCP may decide not to get them or add new tests based on their clinical decision)  On your next visit with your primary care physician please Get Medicines reviewed and adjusted.  If you experience worsening of your admission symptoms, develop shortness of breath, life threatening emergency, suicidal or homicidal thoughts you must seek medical attention immediately by calling 911 or calling your MD immediately  if symptoms less severe.  You Must read complete instructions/literature along with all the possible adverse reactions/side effects for all the Medicines you take and that have been prescribed to you. Take any new Medicines  after you have completely understood and accpet all the possible adverse reactions/side effects.   Do not drive, operate heavy machinery, perform activities at heights, swimming or participation in water activities or provide baby sitting services if your were admitted for syncope or siezures until you have seen by Primary MD or a Neurologist and advised to do so again.  Do not drive when taking Pain medications.   Procedures/Studies: DG Chest Port 1 View  Result Date: 07/02/2022 CLINICAL DATA:  Dyspnea. Admitted with multiple right-sided rib fractures. EXAM: PORTABLE CHEST 1 VIEW COMPARISON:  Radiographs 06/25/2022 and 06/23/2022.  CT 06/21/2022. FINDINGS: 1505 hours.  The heart size and mediastinal contours are stable. There is a coronary artery stent. Interval improved aeration of both lung bases with near complete resolution of previously demonstrated small pleural effusions. There is no evidence of pneumothorax. Lower right-sided rib fractures are grossly stable. No new osseous abnormalities are identified. IMPRESSION: Interval improved aeration of both lung bases with near complete resolution of previously demonstrated small pleural effusions. No pneumothorax. Electronically Signed   By: Richardean Sale M.D.   On: 07/02/2022 15:25   DG Chest Port 1 View  Result Date: 06/25/2022 CLINICAL DATA:  Hypoxia.  Right rib fracture. EXAM: PORTABLE CHEST 1 VIEW COMPARISON:  06/23/2022 and chest CT 06/21/2022 FINDINGS: Again noted are displaced right lower rib fractures. Negative for pneumothorax. Persistent hazy densities in the right lower chest that are poorly characterized due to overlying support apparatuses. However, the densities at the right lung base may have increased since 06/23/2022. Concern for a small left pleural effusion. Heart size is stable. Partial visualization of the cervical surgical plate. IMPRESSION: 1. Increased densities in the right lower chest. Findings concerning for increased  atelectasis and/or pleural fluid. This area is poorly characterized due to overlying support apparatuses. 2. Concern for small left pleural effusion. 3. Displaced right rib fractures without pneumothorax. Electronically Signed   By: Markus Daft M.D.   On: 06/25/2022 09:15   DG Chest Port 1 View  Result Date: 06/23/2022 CLINICAL DATA:  Chest pain, NSTEMI EXAM: PORTABLE CHEST 1 VIEW COMPARISON:  Portable exam 1759 hours compared to 0730 hours FINDINGS: Normal heart size, mediastinal contours, and pulmonary vascularity. Coronary stents noted. Small RIGHT pleural effusion and mild bibasilar atelectasis versus infiltrate. Upper lungs clear. No pneumothorax. Mildly displaced fractures of the posterior RIGHT tenth and eleventh ribs. IMPRESSION: Mild bibasilar infiltrate versus atelectasis and minimal RIGHT pleural effusion. Electronically Signed   By: Lavonia Dana M.D.   On: 06/23/2022 18:07   DG Chest Port 1 View  Result Date: 06/23/2022 CLINICAL DATA:  Provided history: Atelectasis of right lung. EXAM: PORTABLE CHEST 1 VIEW COMPARISON:  Prior chest radiographs 06/22/2022 and earlier. Chest CT 06/21/2022. FINDINGS: The cardiomediastinal silhouette is unchanged. Aortic atherosclerosis. Persistent hazy opacity at the right lung base which may reflect pleural fluid, atelectasis, pulmonary contusion and/or airspace consolidation. No appreciable airspace consolidation on the left. Persistent trace left pleural effusion. No evidence of pneumothorax. Known acute rib fractures, as described on the recent prior chest CT of 06/21/2022. IMPRESSION: 1. Persistent hazy opacity at the right lung base, which may reflect layering pleural fluid, atelectasis, pulmonary contusion and/or airspace consolidation. 2. Persistent trace left pleural effusion. 3. Aortic Atherosclerosis (ICD10-I70.0). Electronically Signed   By: Kellie Simmering D.O.   On: 06/23/2022 09:36   DG CHEST PORT 1 VIEW  Result Date: 06/22/2022 CLINICAL DATA:   Pneumothorax EXAM: PORTABLE CHEST 1 VIEW COMPARISON:  06/20/2022. FINDINGS: Right base opacity suggests layering effusion. Lungs are otherwise clear. No pneumothorax identified. Normal pulmonary vasculature. Normal cardiac silhouette. Fractures identified of the right posterior tenth and eleventh ribs. Additional fractures described on CT are not evident on plain film. IMPRESSION: Right base consolidation or volume loss and effusion. Electronically Signed   By: Sammie Bench M.D.   On: 06/22/2022 08:54   CT HEAD WO CONTRAST (5MM)  Result Date: 06/21/2022 CLINICAL DATA:  Fall, bleeding EXAM: CT HEAD WITHOUT CONTRAST TECHNIQUE: Contiguous axial images were obtained from the base of the skull through the vertex without intravenous contrast. RADIATION DOSE REDUCTION: This exam was performed according  to the departmental dose-optimization program which includes automated exposure control, adjustment of the mA and/or kV according to patient size and/or use of iterative reconstruction technique. COMPARISON:  06/05/2017 FINDINGS: Brain: No evidence of acute infarction, hemorrhage, mass, mass effect, or midline shift. No hydrocephalus or extra-axial fluid collection. Redemonstrated encephalomalacia in the left parieto-occipital region. Left posterior fossa arachnoid cyst. Periventricular white matter changes, likely the sequela of chronic small vessel ischemic disease. Vascular: No hyperdense vessel. Atherosclerotic calcifications in the intracranial carotid and vertebral arteries. Skull: Negative for fracture or focal lesion. Sinuses/Orbits: Mucosal thickening in the maxillary sinuses. No acute finding in the orbits. Other: The mastoid air cells are well aerated. IMPRESSION: No acute intracranial process. Electronically Signed   By: Merilyn Baba M.D.   On: 06/21/2022 14:34   CT CHEST WO CONTRAST  Result Date: 06/21/2022 CLINICAL DATA:  Right hemothorax, anemia EXAM: CT CHEST WITHOUT CONTRAST TECHNIQUE:  Multidetector CT imaging of the chest was performed following the standard protocol without IV contrast. RADIATION DOSE REDUCTION: This exam was performed according to the departmental dose-optimization program which includes automated exposure control, adjustment of the mA and/or kV according to patient size and/or use of iterative reconstruction technique. COMPARISON:  CT 06/19/2022 FINDINGS: Cardiovascular: Heart size is normal. No pericardial effusion. Relative hypoattenuation of the cardiac blood pool indicative of anemia. Thoracic aortic arch measures 3.8 cm in diameter. Atherosclerotic calcification of the aorta and coronary arteries. Central pulmonary vasculature is within normal limits. Mediastinum/Nodes: No new or enlarging lymph nodes within the chest. Thyroid, trachea, and esophagus demonstrate no significant findings. No mediastinal hematoma. No pneumomediastinum. Lungs/Pleura: Small-moderate-sized right hemothorax, increased from prior. Minimal air within the right pleural space. Near-complete right lower lobe atelectasis/consolidation, significantly progressed. Trace left pleural effusion with new dependent opacities in the left lower lobe and lingula. Background of mild-to-moderate emphysema. Upper Abdomen: No new or acute findings within the imaged upper abdomen. Musculoskeletal: Redemonstration of multiple acute right-sided rib fractures including the right eighth through twelfth ribs as well as the right T11 transverse process. The degree of displacement of the right eighth rib fracture has increased since prior (series 2, image 107). Posterior paraspinal intramuscular hematoma centered at the posterior right eleventh rib fracture measures approximately 8.0 x 3.4 x 7.5 cm, similar in size to the previous study. No subcutaneous emphysema of the chest wall. IMPRESSION: 1. Small-moderate-sized right hemothorax, increased from prior study. Minimal air within the right pleural space. 2. Near-complete  right lower lobe atelectasis/consolidation, significantly progressed since prior study. 3. Trace left pleural effusion with new dependent opacities in the left lower lobe and lingula, which may represent atelectasis or developing pneumonia. 4. Redemonstration of multiple acute right-sided rib fractures including the right eighth through twelfth ribs as well as the right T11 transverse process. The degree of displacement of the right eighth rib fracture has increased since prior study. 5. Posterior paraspinal intramuscular hematoma centered at the posterior right eleventh rib fracture measures approximately 8.0 x 3.4 x 7.5 cm, similar in size to the previous study. 6. Aortic and coronary artery atherosclerosis (ICD10-I70.0). 7. Thoracic aortic arch measures 3.8 cm in diameter. Recommend annual imaging followup by CTA or MRA. This recommendation follows 2010 ACCF/AHA/AATS/ACR/ASA/SCA/SCAI/SIR/STS/SVM Guidelines for the Diagnosis and Management of Patients with Thoracic Aortic Disease. Circulation.2010; [id number]. Aortic aneurysm NOS (ICD10-I71.9) Electronically Signed   By: Davina Poke D.O.   On: 06/21/2022 09:46   DG Chest Port 1 View  Result Date: 06/20/2022 CLINICAL DATA:  Right hemothorax. EXAM: PORTABLE CHEST  1 VIEW COMPARISON:  June 19, 2022. FINDINGS: The heart size and mediastinal contours are within normal limits. Mild bibasilar subsegmental atelectasis is noted. No definite pneumothorax or pleural effusion is noted. The visualized skeletal structures are unremarkable. IMPRESSION: Mild bibasilar subsegmental atelectasis. Electronically Signed   By: Marijo Conception M.D.   On: 06/20/2022 14:12   CT CHEST ABDOMEN PELVIS W CONTRAST  Result Date: 06/19/2022 CLINICAL DATA:  Blunt trauma. Fall at home in the shower. Right flank pain. EXAM: CT CHEST, ABDOMEN, AND PELVIS WITH CONTRAST TECHNIQUE: Multidetector CT imaging of the chest, abdomen and pelvis was performed following the standard protocol  during bolus administration of intravenous contrast. RADIATION DOSE REDUCTION: This exam was performed according to the departmental dose-optimization program which includes automated exposure control, adjustment of the mA and/or kV according to patient size and/or use of iterative reconstruction technique. CONTRAST:  160mL OMNIPAQUE IOHEXOL 300 MG/ML  SOLN COMPARISON:  Chest radiograph earlier today. Chest CT 04/12/2022 FINDINGS: CT CHEST FINDINGS Cardiovascular: No evidence of acute aortic or vascular injury. Moderate aortic atherosclerosis and tortuosity coronary artery calcifications versus stents. The heart is normal in size. No pericardial effusion. Mediastinum/Nodes: Mediastinal hemorrhage or hematoma. No pneumomediastinum. Small mediastinal lymph nodes are all subcentimeter short axis, for example lower paratracheal node measures 7 mm series 2, image 19. Prominent right hilar node, series 2, image 24, partially motion obscured but similar to prior chest CT. No esophageal wall thickening. No visible thyroid nodule. Lungs/Pleura: No pneumothorax. Small right hemothorax with adjacent atelectasis or contusion in the right lower lobe. Background emphysema. Irregular nodular density in the basilar left lower lobe, series 4, image 99 measures 1.7 x 1.2 cm, previously 2.5 x 1.1 cm. Improvement but persistent heterogeneous ground-glass opacities. No endobronchial lesion. Musculoskeletal: Multiple posterior lower rib fractures. There are fractures of the right posterior eighth through twelfth ribs; the ninth, tenth, and eleventh rib fractures are displaced and segmental. There is also fracture of the right T11 transverse process. No fracture extension into the vertebral body, middle or anterior columns. No left rib fractures. The sternum, included clavicles and shoulder girdles are intact. Large intramuscular hematoma within the right paraspinal as well as posterior flank musculature. Overlying subcutaneous edema. CT  ABDOMEN PELVIS FINDINGS Hepatobiliary: Despite overlying right rib fractures, there is no evidence of hepatic injury or perihepatic hematoma. No focal liver abnormality. Mild gallbladder distention without calcified gallstone or pericholecystic inflammation. Common bile duct measures 7 mm, likely normal for age. Pancreas: No evidence of injury. No ductal dilatation or inflammation. Spleen: No splenic injury or perisplenic hematoma. Adrenals/Urinary Tract: 17 mm right adrenal nodule with Hounsfield units of 38, however on prior exam this with low density and consistent with benign adenoma. No evidence of adrenal hemorrhage. Slight thickening of the right pararenal fascia of but no renal injury. No pararenal hematoma. Early excretion of IV contrast in both renal collecting systems. No suspicious renal lesion. The urinary bladder is partially distended, no bladder injury. Stomach/Bowel: No evidence of bowel injury or mesenteric hematoma. No bowel wall thickening or inflammation. No obstruction. Moderate volume of stool throughout the colon. Moderate rectal distention with stool. Occasional left colonic diverticula without diverticulitis. Vascular/Lymphatic: No vascular injury. No retroperitoneal fluid. Irregular calcified and noncalcified atheromatous plaque throughout the abdominal aorta. Patent portal vein. No abdominopelvic adenopathy. Reproductive: Prostate is unremarkable. Other: Subcutaneous contusion involving the right flank. There is also intramuscular hematoma involving the posterior flank musculature. No intra-abdominal hematoma or free air. Musculoskeletal: No acute fracture of the  lumbar spine or pelvis. L5 is sacralized. The pubic symphysis is fused. There is a bone island in the right iliac bone. IMPRESSION: 1. Multiple right posterior lower rib fractures from rib 8 through 12. The ninth, tenth, and eleventh rib fractures are displaced and segmental. There is also fracture of the right T11 transverse  process. 2. Small right hemothorax with adjacent atelectasis or contusion in the right lower lobe. No pneumothorax. 3. Large intramuscular hematoma within the right paraspinal and posterior flank musculature. 4. No evidence of acute traumatic injury to the abdomen or pelvis. 5. Irregular nodular density in the basilar left lower lobe persists, however is slightly decreased in size from prior exam. It is unclear if decrease in size is due to differences in slice selection or screw decrease in size from prior exam. Recommend additional follow-up chest CT or PET CT in 3 months. Aortic Atherosclerosis (ICD10-I70.0) and Emphysema (ICD10-J43.9). Electronically Signed   By: Keith Rake M.D.   On: 06/19/2022 15:13   DG Chest 2 View  Result Date: 06/19/2022 CLINICAL DATA:  Right flank pain after injury in the shower. EXAM: CHEST - 2 VIEW COMPARISON:  CT chest and chest x-ray dated April 12, 2022. FINDINGS: The heart size and mediastinal contours are within normal limits. Normal pulmonary vascularity. Density in the posterior left lower lobe persists as seen on prior CT. Mild elevation of the right hemidiaphragm with minimal right basilar atelectasis. No pleural effusion or pneumothorax. No acute osseous abnormality. IMPRESSION: 1. No acute cardiopulmonary disease. 2. Persistent posterior left lower lobe nodule as seen on prior CT. As per prior recommendations, follow-up noncontrast chest CT, PET-CT, or tissue sampling is recommended now, unless the patient has significant comorbidities. Electronically Signed   By: Titus Dubin M.D.   On: 06/19/2022 11:34   VAS [place] ABI WITH/WO TBI  Result Date: 06/15/2022  LOWER EXTREMITY DOPPLER STUDY Patient Name:  CHAY THEISEN  Date of Exam:   06/15/2022 Medical Rec #: [id number]         Accession #:    [id number] Date of Birth: 1940/09/20         Patient Gender: M Patient Age:   71 years Exam Location:  [place] Procedure:      VAS [place] ABI WITH/WO TBI Referring Phys:  Christell Faith PA-C --------------------------------------------------------------------------------  Indications: Bilateral calf pain, R=L. Present for at least one year. Affects              him largely at night or walking long prolonged distances as in              [employer] High Risk Factors: Hypertension, hyperlipidemia, Diabetes, past history of                    smoking, prior MI, coronary artery disease.  Comparison Study: None Performing Technologist: Pilar Jarvis RVT  Examination Guidelines: A complete evaluation includes at minimum, Doppler waveform signals and systolic blood pressure reading at the level of bilateral brachial, anterior tibial, and posterior tibial arteries, when vessel segments are accessible. Bilateral testing is considered an integral part of a complete examination. Photoelectric Plethysmograph (PPG) waveforms and toe systolic pressure readings are included as required and additional duplex testing as needed. Limited examinations for reoccurring indications may be performed as noted.  ABI Findings: +---------+------------------+-----+---------+----------+ Right    Rt Pressure (mmHg)IndexWaveform Comment    +---------+------------------+-----+---------+----------+ Brachial 180  triphasic           +---------+------------------+-----+---------+----------+ PTA      167               0.92 triphasic           +---------+------------------+-----+---------+----------+ DP       173               0.95 triphasicRetrograde +---------+------------------+-----+---------+----------+ Great Toe157               0.86 Normal              +---------+------------------+-----+---------+----------+ +---------+------------------+-----+----------+-------+ Left     Lt Pressure (mmHg)IndexWaveform  Comment +---------+------------------+-----+----------+-------+ Brachial 182                    triphasic          +---------+------------------+-----+----------+-------+ PTA      118               0.65 monophasic        +---------+------------------+-----+----------+-------+ DP       110               0.60 monophasic        +---------+------------------+-----+----------+-------+ Great Toe84                0.46 Normal            +---------+------------------+-----+----------+-------+ +-------+-----------+-----------+------------+------------+ ABI/TBIToday's ABIToday's TBIPrevious ABIPrevious TBI +-------+-----------+-----------+------------+------------+ Right  0.95       0.86                                +-------+-----------+-----------+------------+------------+ Left   0.65       0.46                                +-------+-----------+-----------+------------+------------+   Summary: Right: Resting right ankle-brachial index is within normal range. The right toe-brachial index is normal. Left: Resting left ankle-brachial index indicates moderate left lower extremity arterial disease. The left toe-brachial index is abnormal. *See table(s) above for measurements and observations.  Suggest follow up study in 12 months. Suggest Peripheral Vascular Consult. Electronically signed by Quay Burow MD on 06/15/2022 at 6:12:44 PM.    Final    VAS [place] LOWER EXTREMITY ARTERIAL DUPLEX  Result Date: 06/15/2022 LOWER EXTREMITY ARTERIAL DUPLEX STUDY Patient Name:  KIERON PARTIN  Date of Exam:   06/15/2022 Medical Rec #: [id number]         Accession #:    [id number] Date of Birth: 14-Apr-1940         Patient Gender: M Patient Age:   31 years Exam Location:  [place] Procedure:      VAS [place] LOWER EXTREMITY ARTERIAL DUPLEX Referring Phys: Christell Faith --------------------------------------------------------------------------------  Indications: Claudication, and Bilateral calf pain, R=L. Present for one year.              Mostly affected at night but also during prolonged walking. High Risk Factors:  Hypertension, hyperlipidemia, Diabetes, past history of                    smoking, prior MI, coronary artery disease.  Current ABI: 0.95 right, 0.65 left Comparison Study: None Performing Technologist: Pilar Jarvis RDMS, RVT, RDCS  Examination Guidelines: A complete evaluation includes B-mode imaging, spectral Doppler, color Doppler, and power Doppler as needed of all  accessible portions of each vessel. Bilateral testing is considered an integral part of a complete examination. Limited examinations for reoccurring indications may be performed as noted.  +----------+--------+-----+---------------+---------+--------+ RIGHT     PSV cm/sRatioStenosis       Waveform Comments +----------+--------+-----+---------------+---------+--------+ CIA Prox  109                                           +----------+--------+-----+---------------+---------+--------+ CIA Mid   99                                            +----------+--------+-----+---------------+---------+--------+ EIA Mid   76                          biphasic          +----------+--------+-----+---------------+---------+--------+ EIA Distal93                          biphasic          +----------+--------+-----+---------------+---------+--------+ CFA Mid   111                         biphasic          +----------+--------+-----+---------------+---------+--------+ CFA Distal118                         biphasic          +----------+--------+-----+---------------+---------+--------+ DFA       164                         biphasic          +----------+--------+-----+---------------+---------+--------+ SFA Prox  129                         triphasic         +----------+--------+-----+---------------+---------+--------+ SFA Mid   128                         biphasic          +----------+--------+-----+---------------+---------+--------+ SFA Distal86                          biphasic           +----------+--------+-----+---------------+---------+--------+ POP Prox  213                         biphasic          +----------+--------+-----+---------------+---------+--------+ POP Mid   306          50-74% stenosisbiphasic          +----------+--------+-----+---------------+---------+--------+ POP Distal76                          biphasic          +----------+--------+-----+---------------+---------+--------+ TP Trunk  98                          biphasic          +----------+--------+-----+---------------+---------+--------+ ATA  Mid   53                          biphasic          +----------+--------+-----+---------------+---------+--------+ PTA Mid   246                         biphasic          +----------+--------+-----+---------------+---------+--------+ PERO Mid  26                          biphasic          +----------+--------+-----+---------------+---------+--------+ A focal velocity elevation of 306 cm/s was obtained at mid Popliteal art with a VR of 1.4. Findings are characteristic of 50-74% stenosis. A 2nd focal velocity elevation was visualized, measuring 246 cm/s at mid PTA with a VR of 2.5. Findings are characteristic of 50-74% stenosis.  +----------+--------+-----+---------------+----------+--------+ LEFT      PSV cm/sRatioStenosis       Waveform  Comments +----------+--------+-----+---------------+----------+--------+ CIA Prox  98                          biphasic           +----------+--------+-----+---------------+----------+--------+ CIA Mid   103                         biphasic           +----------+--------+-----+---------------+----------+--------+ EIA Prox  61                          biphasic           +----------+--------+-----+---------------+----------+--------+ EIA Mid   77                          biphasic           +----------+--------+-----+---------------+----------+--------+ EIA Distal73                           biphasic           +----------+--------+-----+---------------+----------+--------+ CFA Mid   134                         triphasic          +----------+--------+-----+---------------+----------+--------+ CFA Distal246          50-74% stenosis                   +----------+--------+-----+---------------+----------+--------+ DFA       184                         biphasic           +----------+--------+-----+---------------+----------+--------+ SFA Prox  156                         biphasic           +----------+--------+-----+---------------+----------+--------+ SFA Mid   62                          biphasic           +----------+--------+-----+---------------+----------+--------+ SFA Distal410  75-99% stenosisbiphasic           +----------+--------+-----+---------------+----------+--------+ POP Prox  365          50-74% stenosisbiphasic           +----------+--------+-----+---------------+----------+--------+ POP Mid   155                         monophasic         +----------+--------+-----+---------------+----------+--------+ POP Distal49                          monophasic         +----------+--------+-----+---------------+----------+--------+ TP Trunk  100                         biphasic           +----------+--------+-----+---------------+----------+--------+ ATA Mid   59                                             +----------+--------+-----+---------------+----------+--------+ PTA Mid   36                          monophasic         +----------+--------+-----+---------------+----------+--------+ PERO Mid  40                          monophasic         +----------+--------+-----+---------------+----------+--------+ A focal velocity elevation of 246 cm/s was obtained at distal CFA with a VR of 1.8. Findings are characteristic of 50-74% stenosis. A 2nd focal velocity elevation was  visualized, measuring 410 cm/s at distal SFA with a VR of 6.61. Findings are characteristic of 75-99% stenosis. A 3rd focal velocity elevation was visualized, measuring 365 cm/s at prx Popliteal art with a VR of 0.89. Findings are characteristic of 50-74% stenosis.  Summary: Right: 50-74% stenosis noted in the popliteal artery. Left: 50-74% stenosis noted in the common femoral artery. 75-99% stenosis noted in the superficial femoral artery. 50-74% stenosis noted in the popliteal artery.  See table(s) above for measurements and observations. Suggest follow up study in 12 months. Suggest Peripheral Vascular Consult. Electronically signed by Quay Burow MD on 06/15/2022 at 6:11:19 PM.    Final    VAS US CAROTID  Result Date: 06/15/2022 Carotid Arterial Duplex Study Patient Name:  IVOR BLOYD  Date of Exam:   06/15/2022 Medical Rec #: [id number]         Accession #:    [id number] Date of Birth: [birth year]         Patient Gender: M Patient Age:   71 years Exam Location:  [place] Procedure:      VAS US CAROTID Referring Phys: Christell Faith --------------------------------------------------------------------------------  Indications:       Follow-up from 12/2018. Comparison Study:  12/2018 carotid duplex exam showed RICA velocities of 90/24                    cm/s and LICA velocities of 0000000 cm/s Performing Technologist: Pilar Jarvis RDMS, RVT, RDCS  Examination Guidelines: A complete evaluation includes B-mode imaging, spectral Doppler, color Doppler, and power Doppler as needed of all accessible portions of each vessel. Bilateral testing is considered an integral part of a complete  examination. Limited examinations for reoccurring indications may be performed as noted.  Right Carotid Findings: +----------+--------+--------+--------+-----------------------+--------+           PSV cm/sEDV cm/sStenosisPlaque Description     Comments +----------+--------+--------+--------+-----------------------+--------+ CCA  Prox  48      7                                               +----------+--------+--------+--------+-----------------------+--------+ CCA Distal55      10              hypoechoic and smooth           +----------+--------+--------+--------+-----------------------+--------+ ICA Prox  88      23              smooth and heterogenous         +----------+--------+--------+--------+-----------------------+--------+ ICA Mid   113     34      1-39%                                   +----------+--------+--------+--------+-----------------------+--------+ ICA Distal67      16                                              +----------+--------+--------+--------+-----------------------+--------+ ECA       206             >50%                                    +----------+--------+--------+--------+-----------------------+--------+ +----------+--------+-------+----------------+-------------------+           PSV cm/sEDV cmsDescribe        Arm Pressure (mmHG) +----------+--------+-------+----------------+-------------------+ [id number]             Multiphasic, [id number]                 +----------+--------+-------+----------------+-------------------+ +---------+--------+--+--------+-+---------+ VertebralPSV cm/s32EDV cm/s8Antegrade +---------+--------+--+--------+-+---------+  Left Carotid Findings: +----------+--------+--------+--------+--------------------------+--------+           PSV cm/sEDV cm/sStenosisPlaque Description        Comments +----------+--------+--------+--------+--------------------------+--------+ CCA Prox  63      13                                                 +----------+--------+--------+--------+--------------------------+--------+ CCA Distal69      12                                                 +----------+--------+--------+--------+--------------------------+--------+ ICA Prox  93      21              calcific,  focal and smooth         +----------+--------+--------+--------+--------------------------+--------+ ICA Mid   132     35      1-39%                                      +----------+--------+--------+--------+--------------------------+--------+  ICA Distal75      22                                                 +----------+--------+--------+--------+--------------------------+--------+ ECA       409     24                                                 +----------+--------+--------+--------+--------------------------+--------+ +----------+--------+--------+----------------+-------------------+           PSV cm/sEDV cm/sDescribe        Arm Pressure (mmHG) +----------+--------+--------+----------------+-------------------+ [id number]              Multiphasic, [id number]                 +----------+--------+--------+----------------+-------------------+ +---------+--------+--+--------+--+---------+ VertebralPSV cm/s54EDV cm/s17Antegrade +---------+--------+--+--------+--+---------+   Summary: Right Carotid: Velocities in the right ICA are consistent with a 1-39% stenosis.                Non-hemodynamically significant plaque <50% noted in the CCA. The                ECA appears >50% stenosed. Left Carotid: Velocities in the left ICA are consistent with a 1-39% stenosis.               Non-hemodynamically significant plaque <50% noted in the CCA. The               ECA appears <50% stenosed.                Recommend follow- due to abundant bilateral plaque. Vertebrals:  Bilateral vertebral arteries demonstrate antegrade flow. Subclavians: Normal flow hemodynamics were seen in bilateral subclavian              arteries. *See table(s) above for measurements and observations. Suggest follow up study in 12 months. Electronically signed by Quay Burow MD on 06/15/2022 at 6:10:58 PM.    Final      The results of significant diagnostics from this hospitalization (including imaging,  microbiology, ancillary and laboratory) are listed below for reference.     Microbiology: No results found for this or any previous visit (from the past 240 hour(s)).   Labs: BNP (last 3 results) No results for input(s): "BNP" in the last 8760 hours. Basic Metabolic Panel: Recent Labs  Lab 06/30/22 0540 07/01/22 0400 07/02/22 0439  NA 134* 135  --   K 4.2 4.3  --   CL 101 101  --   CO2 27 26  --   GLUCOSE 205* 192*  --   BUN 36* 30*  --   CREATININE 1.00 0.91  --   CALCIUM 8.7* 8.8*  --   MG  --  2.0 2.0   Liver Function Tests: No results for input(s): "AST", "ALT", "ALKPHOS", "BILITOT", "PROT", "ALBUMIN" in the last 168 hours. No results for input(s): "LIPASE", "AMYLASE" in the last 168 hours. No results for input(s): "AMMONIA" in the last 168 hours. CBC: Recent Labs  Lab 06/28/22 0453 06/29/22 0716 06/30/22 0540 07/01/22 0400 07/02/22 0439  WBC 13.5* 14.6* 11.6* 13.1* 12.8*  HGB 9.7* 10.0* 9.6* 9.6* 9.9*  HCT 30.4* 31.4* 29.8* 30.6* 31.1*  MCV 91.6 91.5 91.7 91.6 90.7  PLT  408* 480* 479* 503* 526*   Cardiac Enzymes: No results for input(s): "CKTOTAL", "CKMB", "CKMBINDEX", "TROPONINI" in the last 168 hours. BNP: Invalid input(s): "POCBNP" CBG: Recent Labs  Lab 07/02/22 0734 07/02/22 1203 07/02/22 1602 07/02/22 2109 07/03/22 0845  GLUCAP 186* 190* 234* 264* 169*   D-Dimer No results for input(s): "DDIMER" in the last 72 hours. Hgb A1c No results for input(s): "HGBA1C" in the last 72 hours. Lipid Profile No results for input(s): "CHOL", "HDL", "LDLCALC", "TRIG", "CHOLHDL", "LDLDIRECT" in the last 72 hours. Thyroid function studies No results for input(s): "TSH", "T4TOTAL", "T3FREE", "THYROIDAB" in the last 72 hours.  Invalid input(s): "FREET3" Anemia work up No results for input(s): "VITAMINB12", "FOLATE", "FERRITIN", "TIBC", "IRON", "RETICCTPCT" in the last 72 hours. Urinalysis    Component Value Date/Time   COLORURINE YELLOW (A) 06/25/2022 1930    APPEARANCEUR CLEAR (A) 06/25/2022 1930   LABSPEC 1.023 06/25/2022 1930   PHURINE 5.0 06/25/2022 1930   GLUCOSEU 50 (A) 06/25/2022 1930   HGBUR NEGATIVE 06/25/2022 1930   BILIRUBINUR NEGATIVE 06/25/2022 1930   KETONESUR NEGATIVE 06/25/2022 1930   PROTEINUR 100 (A) 06/25/2022 1930   NITRITE NEGATIVE 06/25/2022 1930   LEUKOCYTESUR NEGATIVE 06/25/2022 1930   Sepsis Labs Recent Labs  Lab 06/29/22 0716 06/30/22 0540 07/01/22 0400 07/02/22 0439  WBC 14.6* 11.6* 13.1* 12.8*   Microbiology No results found for this or any previous visit (from the past 240 hour(s)).   Time coordinating discharge:  I have spent 35 minutes face to face with the patient and on the ward discussing the patients care, assessment, plan and disposition with other care givers. >50% of the time was devoted counseling the patient about the risks and benefits of treatment/Discharge disposition and coordinating care.   SIGNED:   Damita Lack, MD  [facility] 07/03/2022, 11:52 AM   If 7PM-7AM, please contact night-coverage

## 2022-07-10 ENCOUNTER — Encounter: Payer: Self-pay | Admitting: *Deleted

## 2022-07-10 ENCOUNTER — Telehealth: Payer: Self-pay | Admitting: *Deleted

## 2022-07-10 DIAGNOSIS — I214 Non-ST elevation (NSTEMI) myocardial infarction: Secondary | ICD-10-CM

## 2022-07-10 DIAGNOSIS — Z955 Presence of coronary angioplasty implant and graft: Secondary | ICD-10-CM

## 2022-07-10 NOTE — Telephone Encounter (Signed)
Mrs. Esmaili called and left a message to for one of [place] to call back for an update on [place]. He is doing better and eager to get home.  He is working with physical therapy at [facility] and starting to do some self care duties.  We talked about keeping [place] updated on his progress and to let us know when he is released and plans to return to rehab.

## 2022-07-14 ENCOUNTER — Other Ambulatory Visit: Payer: Self-pay | Admitting: Cardiovascular Disease

## 2022-07-18 ENCOUNTER — Other Ambulatory Visit: Payer: Self-pay | Admitting: Physician Assistant

## 2022-07-18 DIAGNOSIS — I739 Peripheral vascular disease, unspecified: Secondary | ICD-10-CM

## 2022-07-19 ENCOUNTER — Encounter: Payer: Self-pay | Admitting: *Deleted

## 2022-07-19 DIAGNOSIS — I214 Non-ST elevation (NSTEMI) myocardial infarction: Secondary | ICD-10-CM

## 2022-07-19 DIAGNOSIS — Z955 Presence of coronary angioplasty implant and graft: Secondary | ICD-10-CM

## 2022-07-19 NOTE — Progress Notes (Signed)
Cardiac Individual Treatment Plan  Patient Details  Name: JOZIAH DOLLINS MRN: [id number] Date of Birth: 07/06/1940 Referring Provider:   Flowsheet Row Cardiac Rehab from 05/17/2022 in [facility] Cardiac and Pulmonary Rehab  Referring Provider Dr Yvonne Kendall, MD       Initial Encounter Date:  Flowsheet Row Cardiac Rehab from 05/17/2022 in [facility] Cardiac and Pulmonary Rehab  Date 05/17/22       Visit Diagnosis: NSTEMI (non-ST elevation myocardial infarction)  Status post coronary artery stent placement  Patient's Home Medications on Admission:  Current Outpatient Medications:    amLODipine (NORVASC) 5 MG tablet, Take 1 tablet (5 mg total) by mouth daily., Disp: , Rfl:    aspirin 81 MG tablet, Take 81 mg by mouth daily., Disp: , Rfl:    atorvastatin (LIPITOR) 80 MG tablet, Take 1 tablet (80 mg total) by mouth daily., Disp: 30 tablet, Rfl: 2   clopidogrel (PLAVIX) 75 MG tablet, TAKE 1 TABLET BY MOUTH EVERY DAY WITH BREAKFAST, Disp: 90 tablet, Rfl: 0   cyanocobalamin 1000 MCG tablet, Take 1,000 mcg by mouth daily., Disp: , Rfl:    feeding supplement (ENSURE ENLIVE / ENSURE PLUS) LIQD, Take 237 mLs by mouth 3 (three) times daily between meals., Disp: 237 mL, Rfl: 12   lisinopril (ZESTRIL) 20 MG tablet, TAKE 1 TABLET BY MOUTH EVERY DAY, Disp: 90 tablet, Rfl: 0   metFORMIN (GLUCOPHAGE) 1000 MG tablet, Take 1 tablet (1,000 mg total) by mouth 2 (two) times daily with a meal. Resume 05/05/2022, Disp: , Rfl:    metoprolol tartrate (LOPRESSOR) 25 MG tablet, TAKE 1 TABLET BY MOUTH TWICE A DAY, Disp: 180 tablet, Rfl: 0   oxyCODONE (OXY IR/ROXICODONE) 5 MG immediate release tablet, Take 0.5-1 tablets (2.5-5 mg total) by mouth every 6 (six) hours as needed for severe pain or moderate pain., Disp: 20 tablet, Rfl: 0   pantoprazole (PROTONIX) 20 MG tablet, Take 1 tablet (20 mg total) by mouth daily., Disp: 30 tablet, Rfl: 2   senna-docusate (SENOKOT-S) 8.6-50 MG tablet, Take 2 tablets by mouth at bedtime  as needed for moderate constipation., Disp: , Rfl:    sertraline (ZOLOFT) 25 MG tablet, Take 1 tablet (25 mg total) by mouth daily., Disp: , Rfl:    sitaGLIPtin (JANUVIA) 25 MG tablet, Take 1 tablet by mouth daily., Disp: , Rfl:   Past Medical History: Past Medical History:  Diagnosis Date   Arthritic-like pain    CAD (coronary artery disease)    a. apical MI 5/07 with LHC showing 50% pCFX, 30% pRCA, 99% pLAD, 99% mLAD, 75% dLAD (cypher DES x3 to LAD); b. ETT-myoview (12/08) 81% MPHR,  EF 64%, partially reversible inferoapical perfusion defect similar to prior study 12/07; c. 04/2016 MV: EF 59%, fixed anteroapical defect, no ischemia->Low risk.   Carotid arterial disease (HCC)    a. 05/2016 Carotid U/S: 30-40% bilat dzs, f/u in 2 yrs.   Chest pain 04/13/2022   CVA (cerebral vascular accident) [facility])    a. 07/2010 - h/o CVA/TIA.   Diet-controlled type 2 diabetes mellitus (HCC)    HLD (hyperlipidemia)    HTN (hypertension)    PAT (paroxysmal atrial tachycardia)    a. 06/2016 Event monitor:  rare episodes of atrial tachycardia, longest 7 beats. No afib.    Tobacco Use: Social History   Tobacco Use  Smoking Status Former   Packs/day: 1.00   Years: 25.00   Additional pack years: 0.00   Total pack years: 25.00   Types: Cigarettes  Quit date: 04/17/1985   Years since quitting: 37.2  Smokeless Tobacco Never    Labs: Review Flowsheet  More data exists      Latest Ref Rng & Units 12/28/2012 05/29/2019 04/12/2022 04/13/2022 07/02/2022  Labs for ITP Cardiac and Pulmonary Rehab  Cholestrol 0 - 200 mg/dL 161  096  - 045  -  LDL (calc) 0 - 99 mg/dL 65  64  - 409  -  HDL-C >40 mg/dL 34  34  - 40  -  Trlycerides <150 mg/dL 811  914  - 782  -  Hemoglobin A1c 4.8 - 5.6 % - - 6.8  - -  PH, Arterial 7.35 - 7.45 - - - - 7.47  C  PCO2 arterial 32 - 48 mmHg - - - - 40  C  Bicarbonate 20.0 - 28.0 mmol/L - - - - 29.1  C  O2 Saturation % - - - - 97.1  C    Details      C Corrected result           Exercise Target Goals: Exercise Program Goal: Individual exercise prescription set using results from initial 6 min walk test and THRR while considering  patient's activity barriers and safety.   Exercise Prescription Goal: Initial exercise prescription builds to 30-45 minutes a day of aerobic activity, 2-3 days per week.  Home exercise guidelines will be given to patient during program as part of exercise prescription that the participant will acknowledge.   Education: Aerobic Exercise: - Group verbal and visual presentation on the components of exercise prescription. Introduces F.I.T.T principle from ACSM for exercise prescriptions.  Reviews F.I.T.T. principles of aerobic exercise including progression. Written material given at graduation. Flowsheet Row Cardiac Rehab from 06/14/2022 in [facility] Cardiac and Pulmonary Rehab  Education need identified 05/17/22       Education: Resistance Exercise: - Group verbal and visual presentation on the components of exercise prescription. Introduces F.I.T.T principle from ACSM for exercise prescriptions  Reviews F.I.T.T. principles of resistance exercise including progression. Written material given at graduation.    Education: Exercise & Equipment Safety: - Individual verbal instruction and demonstration of equipment use and safety with use of the equipment. Flowsheet Row Cardiac Rehab from 06/14/2022 in [facility] Cardiac and Pulmonary Rehab  Date 05/17/22  Educator NT  Instruction Review Code 1- Verbalizes Understanding       Education: Exercise Physiology & General Exercise Guidelines: - Group verbal and written instruction with models to review the exercise physiology of the cardiovascular system and associated critical values. Provides general exercise guidelines with specific guidelines to those with heart or lung disease.    Education: Flexibility, Balance, Mind/Body Relaxation: - Group verbal and visual presentation with interactive  activity on the components of exercise prescription. Introduces F.I.T.T principle from ACSM for exercise prescriptions. Reviews F.I.T.T. principles of flexibility and balance exercise training including progression. Also discusses the mind body connection.  Reviews various relaxation techniques to help reduce and manage stress (i.e. Deep breathing, progressive muscle relaxation, and visualization). Balance handout provided to take home. Written material given at graduation.   Activity Barriers & Risk Stratification:  Activity Barriers & Cardiac Risk Stratification - 05/17/22 1538       Activity Barriers & Cardiac Risk Stratification   Activity Barriers None    Cardiac Risk Stratification High             6 Minute Walk:  6 Minute Walk     Row Name 05/17/22 1533  6 Minute Walk   Phase Initial     Distance 815 feet     Walk Time 5.78 minutes     # of Rest Breaks 2     MPH 1.54     METS 2.02     RPE 12     Perceived Dyspnea  0     VO2 Peak 7.06     Symptoms No     Resting HR 67 bpm     Resting BP 142/60     Resting Oxygen Saturation  100 %     Exercise Oxygen Saturation  during 6 min walk 95 %     Max Ex. HR 96 bpm     Max Ex. BP 172/74     2 Minute Post BP 160/68              Oxygen Initial Assessment:   Oxygen Re-Evaluation:   Oxygen Discharge (Final Oxygen Re-Evaluation):   Initial Exercise Prescription:  Initial Exercise Prescription - 05/17/22 1500       Date of Initial Exercise RX and Referring Provider   Date 05/17/22    Referring Provider Dr Yvonne Kendall, MD      Oxygen   Maintain Oxygen Saturation 88% or higher      Treadmill   MPH 1.2    Grade 0    Minutes 15    METs 1.92      Recumbant Bike   Level 1    RPM 50    Watts 15    Minutes 15    METs 2.02      NuStep   Level 1    SPM 80    Minutes 15    METs 2.02      Prescription Details   Frequency (times per week) 3    Duration Progress to 30 minutes of continuous  aerobic without signs/symptoms of physical distress      Intensity   THRR 40-80% of Max Heartrate 95-124    Ratings of Perceived Exertion 11-13    Perceived Dyspnea 0-4      Progression   Progression Continue to progress workloads to maintain intensity without signs/symptoms of physical distress.      Resistance Training   Training Prescription Yes    Weight 3 lb    Reps 10-15             Perform Capillary Blood Glucose checks as needed.  Exercise Prescription Changes:   Exercise Prescription Changes     Row Name 05/17/22 1500 06/01/22 1400 06/13/22 1300 06/14/22 1300 06/29/22 1300     Response to Exercise   Blood Pressure (Admit) 142/60 110/64 132/62 -- 142/68   Blood Pressure (Exercise) 172/74 140/70 128/70 -- 128/70   Blood Pressure (Exit) 160/68 104/66 122/62 -- 118/60   Heart Rate (Admit) 67 bpm 64 bpm 69 bpm -- 68 bpm   Heart Rate (Exercise) 96 bpm 111 bpm 94 bpm -- 87 bpm   Heart Rate (Exit) 70 bpm 76 bpm 69 bpm -- 68 bpm   Oxygen Saturation (Admit) 100 % -- -- -- --   Oxygen Saturation (Exercise) 95 % -- -- -- --   Rating of Perceived Exertion (Exercise) 12 14 15  -- 12   Perceived Dyspnea (Exercise) 0 -- -- -- --   Symptoms none none none -- none   Comments Results First three days of exercise -- -- --   Duration -- Progress to 30 minutes of  aerobic without signs/symptoms  of physical distress Progress to 30 minutes of  aerobic without signs/symptoms of physical distress -- Progress to 30 minutes of  aerobic without signs/symptoms of physical distress   Intensity -- THRR unchanged THRR unchanged -- THRR unchanged     Progression   Progression -- Continue to progress workloads to maintain intensity without signs/symptoms of physical distress. Continue to progress workloads to maintain intensity without signs/symptoms of physical distress. -- Continue to progress workloads to maintain intensity without signs/symptoms of physical distress.   Average METs --  1.4 1.57 -- 1.8     Resistance Training   Training Prescription -- Yes Yes -- Yes   Weight -- 3 lb 3 lb -- 3 lb   Reps -- 10-15 10-15 -- 10-15     Interval Training   Interval Training -- No No -- No     Treadmill   MPH -- -- 0.7 -- --   Grade -- -- 0 -- --   Minutes -- -- 15 -- --   METs -- -- 1.5 -- --     NuStep   Level -- 1 3 -- 3   Minutes -- 15 15 -- 30   METs -- 1.8 1.8 -- 1.8     T5 Nustep   Level -- 1 -- -- --   Minutes -- 15 -- -- --   METs -- 1.7 -- -- --     Biostep-RELP   Level -- 1 1 -- 1   Minutes -- 15 15 -- 15   METs -- 1 -- -- 2     Track   Laps -- 7 6 -- --   Minutes -- 15 15 -- --   METs -- 1.38 1.33 -- --     Home Exercise Plan   Plans to continue exercise at -- -- -- Home (comment)  walking Home (comment)  walking   Frequency -- -- -- Add 2 additional days to program exercise sessions. Add 2 additional days to program exercise sessions.   Initial Home Exercises Provided -- -- -- 06/14/22 06/14/22     Oxygen   Maintain Oxygen Saturation -- 88% or higher 88% or higher -- 88% or higher            Exercise Comments:   Exercise Comments     Row Name 05/22/22 1336           Exercise Comments First full day of exercise!  Patient was oriented to gym and equipment including functions, settings, policies, and procedures.  Patient's individual exercise prescription and treatment plan were reviewed.  All starting workloads were established based on the results of the 6 minute walk test done at initial orientation visit.  The plan for exercise progression was also introduced and progression will be customized based on patient's performance and goals.                Exercise Goals and Review:   Exercise Goals     Row Name 05/17/22 1538             Exercise Goals   Increase Physical Activity Yes       Intervention Provide advice, education, support and counseling about physical activity/exercise needs.;Develop an individualized  exercise prescription for aerobic and resistive training based on initial evaluation findings, risk stratification, comorbidities and participant's personal goals.       Expected Outcomes Short Term: Attend rehab on a regular basis to increase amount of physical activity.;Long Term: Add in home  exercise to make exercise part of routine and to increase amount of physical activity.;Long Term: Exercising regularly at least 3-5 days a week.       Increase Strength and Stamina Yes       Intervention Develop an individualized exercise prescription for aerobic and resistive training based on initial evaluation findings, risk stratification, comorbidities and participant's personal goals.;Provide advice, education, support and counseling about physical activity/exercise needs.       Expected Outcomes Short Term: Perform resistance training exercises routinely during rehab and add in resistance training at home;Long Term: Improve cardiorespiratory fitness, muscular endurance and strength as measured by increased METs and functional capacity ( );Short Term: Increase workloads from initial exercise prescription for resistance, speed, and METs.       Able to understand and use rate of perceived exertion (RPE) scale Yes       Intervention Provide education and explanation on how to use RPE scale       Expected Outcomes Short Term: Able to use RPE daily in rehab to express subjective intensity level;Long Term:  Able to use RPE to guide intensity level when exercising independently       Able to understand and use Dyspnea scale Yes       Intervention Provide education and explanation on how to use Dyspnea scale       Expected Outcomes Short Term: Able to use Dyspnea scale daily in rehab to express subjective sense of shortness of breath during exertion;Long Term: Able to use Dyspnea scale to guide intensity level when exercising independently       Knowledge and understanding of Target Heart Rate Range (THRR) Yes        Intervention Provide education and explanation of THRR including how the numbers were predicted and where they are located for reference       Expected Outcomes Long Term: Able to use THRR to govern intensity when exercising independently;Short Term: Able to state/look up THRR;Short Term: Able to use daily as guideline for intensity in rehab       Able to check pulse independently Yes       Intervention Review the importance of being able to check your own pulse for safety during independent exercise;Provide education and demonstration on how to check pulse in carotid and radial arteries.       Expected Outcomes Short Term: Able to explain why pulse checking is important during independent exercise;Long Term: Able to check pulse independently and accurately       Understanding of Exercise Prescription Yes       Intervention Provide education, explanation, and written materials on patient's individual exercise prescription       Expected Outcomes Short Term: Able to explain program exercise prescription;Long Term: Able to explain home exercise prescription to exercise independently                Exercise Goals Re-Evaluation :  Exercise Goals Re-Evaluation     Row Name 05/22/22 1337 05/31/22 1357 06/01/22 1528 06/13/22 1354 06/14/22 1345     Exercise Goal Re-Evaluation   Exercise Goals Review Increase Physical Activity;Able to understand and use rate of perceived exertion (RPE) scale;Knowledge and understanding of Target Heart Rate Range (THRR);Understanding of Exercise Prescription;Increase Strength and Stamina;Able to understand and use Dyspnea scale Increase Physical Activity;Increase Strength and Stamina;Understanding of Exercise Prescription Increase Physical Activity;Increase Strength and Stamina;Understanding of Exercise Prescription Increase Physical Activity;Increase Strength and Stamina;Understanding of Exercise Prescription Increase Physical Activity;Increase Strength and  Stamina;Understanding of Exercise Prescription;Able  to understand and use rate of perceived exertion (RPE) scale;Knowledge and understanding of Target Heart Rate Range (THRR);Able to understand and use Dyspnea scale;Able to check pulse independently   Comments Reviewed RPE scale, THR and program prescription with pt today.  Pt voiced understanding and was given a copy of goals to take home. Denzil is doing well. Stafff will hold off on going over home exercise as patient just started the program. Explained the importance of starting home exercise and staying compliant with it during rehab for positive results. Encouraged patient to always have a buddy with him with exercise, he states his wife will be with him and they plan on taking walks together. He does not use a cane or walker. Discussed importance checking HR at home as well. Moody is off to a good start in the program. He had an average MET level of 1.4 METs during his first week in the program. He also did level one on the T4 and T5 nustep as well as the biostep. He was only able to walk the track for 10 minutes and got seven laps. We will continue to monitor his progress in the program. Drayk continues to do well in rehab. He tried out the treadmill and was able to walk at a 0.7 mph speed. He also increased to level 3 on the T4 Nustep! He has been hitting his THR only some sessions. We hope to see his walking improve over time. RPEs are appropriate. Will continue to monitor. Reviewed home exercise with pt today.  Pt plans to walk around yard at home for exercise.  Reviewed THR, pulse, RPE, sign and symptoms, pulse oximetery and when to call 911 or MD.  Also discussed weather considerations and indoor options.  Pt voiced understanding.   Expected Outcomes Short: Use RPE daily to regulate intensity.  Long: Follow program prescription in THR. ShorT: EP to go over home exercise Long: Exercise independently at home at appropriate prescription Short: Continue  to work towards 20 minutes of walking. Long: Continue to follow exercise prescription. Short: Increase laps on track, fulfill 20 minutes of walking all together Long: Continue to increase overall MET level and stamina Short: Start to add in more walking at home Long: continue to exercise independently    Row Name 06/29/22 1355 07/12/22 1650           Exercise Goal Re-Evaluation   Exercise Goals Review Increase Physical Activity;Increase Strength and Stamina;Understanding of Exercise Prescription Increase Physical Activity;Increase Strength and Stamina;Understanding of Exercise Prescription      Comments Prabhav is doing well in the program. He only attended rehab twice since the last review due to him taking a fall at home and having to call out. During his last two sessions he was able to increase his overall MET level to 1.8 METs. He also was able to do 30 minutes on the T4 nustep at level 3. We will encourage him to try some walking when he returns to the program. We will continue to monitor his progress in the program. Davyon has not been in the program since last review. He has been placed on a medical hold as he had a fall and is now completing PT. We will continue to receive updates from patient.      Expected Outcomes Short: Return to regular attendance in rehab when ready. Long: Continue to improve strength and stamina. Short: Return to rehab when cleared Long: Graduate from [facility]  Discharge Exercise Prescription (Final Exercise Prescription Changes):  Exercise Prescription Changes - 06/29/22 1300       Response to Exercise   Blood Pressure (Admit) 142/68    Blood Pressure (Exercise) 128/70    Blood Pressure (Exit) 118/60    Heart Rate (Admit) 68 bpm    Heart Rate (Exercise) 87 bpm    Heart Rate (Exit) 68 bpm    Rating of Perceived Exertion (Exercise) 12    Symptoms none    Duration Progress to 30 minutes of  aerobic without signs/symptoms of physical distress     Intensity THRR unchanged      Progression   Progression Continue to progress workloads to maintain intensity without signs/symptoms of physical distress.    Average METs 1.8      Resistance Training   Training Prescription Yes    Weight 3 lb    Reps 10-15      Interval Training   Interval Training No      NuStep   Level 3    Minutes 30    METs 1.8      Biostep-RELP   Level 1    Minutes 15    METs 2      Home Exercise Plan   Plans to continue exercise at Home (comment)   walking   Frequency Add 2 additional days to program exercise sessions.    Initial Home Exercises Provided 06/14/22      Oxygen   Maintain Oxygen Saturation 88% or higher             Nutrition:  Target Goals: Understanding of nutrition guidelines, daily intake of sodium 1500mg , cholesterol 200mg , calories 30% from fat and 7% or less from saturated fats, daily to have 5 or more servings of fruits and vegetables.  Education: All About Nutrition: -Group instruction provided by verbal, written material, interactive activities, discussions, models, and posters to present general guidelines for heart healthy nutrition including fat, fiber, MyPlate, the role of sodium in heart healthy nutrition, utilization of the nutrition label, and utilization of this knowledge for meal planning. Follow up email sent as well. Written material given at graduation.   Biometrics:  Pre Biometrics - 05/17/22 1538       Pre Biometrics   Height 5' 8.5" (1.74 m)    Weight 152 lb (68.9 kg)    Waist Circumference 36 inches    Hip Circumference 36 inches    Waist to Hip Ratio 1 %    BMI (Calculated) 22.77    Single Leg Stand 1.6 seconds              Nutrition Therapy Plan and Nutrition Goals:  Nutrition Therapy & Goals - 05/31/22 1352       Nutrition Therapy   RD appointment deferred Yes   Deferred            Nutrition Assessments:  MEDIFICTS Score Key: ?70 Need to make dietary changes  40-70  Heart Healthy Diet ? 40 Therapeutic Level Cholesterol Diet  Flowsheet Row Cardiac Rehab from 05/17/2022 in [facility] Cardiac and Pulmonary Rehab  Picture Your Plate Total Score on Admission 56      Picture Your Plate Scores: <78 Unhealthy dietary pattern with much room for improvement. 41-50 Dietary pattern unlikely to meet recommendations for good health and room for improvement. 51-60 More healthful dietary pattern, with some room for improvement.  >60 Healthy dietary pattern, although there may be some specific behaviors that could be improved.  Nutrition Goals Re-Evaluation:  Nutrition Goals Re-Evaluation     Row Name 05/31/22 1410             Goals   Nutrition Goal Patient deferred nutrition at this time.                Nutrition Goals Discharge (Final Nutrition Goals Re-Evaluation):  Nutrition Goals Re-Evaluation - 05/31/22 1410       Goals   Nutrition Goal Patient deferred nutrition at this time.             Psychosocial: Target Goals: Acknowledge presence or absence of significant depression and/or stress, maximize coping skills, provide positive support system. Participant is able to verbalize types and ability to use techniques and skills needed for reducing stress and depression.   Education: Stress, Anxiety, and Depression - Group verbal and visual presentation to define topics covered.  Reviews how body is impacted by stress, anxiety, and depression.  Also discusses healthy ways to reduce stress and to treat/manage anxiety and depression.  Written material given at graduation. Flowsheet Row Cardiac Rehab from 06/14/2022 in [facility] Cardiac and Pulmonary Rehab  Date 06/14/22  Educator KW  Instruction Review Code 1- [employer] Understanding       Education: Sleep Hygiene -Provides group verbal and written instruction about how sleep can affect your health.  Define sleep hygiene, discuss sleep cycles and impact of sleep habits. Review good sleep hygiene  tips.    Initial Review & Psychosocial Screening:  Initial Psych Review & Screening - 05/11/22 1358       Initial Review   Current issues with Current Psychotropic Meds;Current Anxiety/Panic      Family Dynamics   Good Support System? Yes   wife,daughter in law,   all children   Comments would have anxiety      Barriers   Psychosocial barriers to participate in program There are no identifiable barriers or psychosocial needs.      Screening Interventions   Interventions Encouraged to exercise;To provide support and resources with identified psychosocial needs;Provide feedback about the scores to participant    Expected Outcomes Short Term goal: Utilizing psychosocial counselor, staff and physician to assist with identification of specific Stressors or current issues interfering with healing process. Setting desired goal for each stressor or current issue identified.;Long Term Goal: Stressors or current issues are controlled or eliminated.;Short Term goal: Identification and review with participant of any Quality of Life or Depression concerns found by scoring the questionnaire.;Long Term goal: The participant improves quality of Life and PHQ9 Scores as seen by post scores and/or verbalization of changes             Quality of Life Scores:   Quality of Life - 05/17/22 1526       Quality of Life   Select Quality of Life      Quality of Life Scores   Health/Function Pre 17.93 %    Socioeconomic Pre 22.75 %    Psych/Spiritual Pre 24.21 %    Family Pre 24.9 %    GLOBAL Pre 21.2 %            Scores of 19 and below usually indicate a poorer quality of life in these areas.  A difference of  2-3 points is a clinically meaningful difference.  A difference of 2-3 points in the total score of the Quality of Life Index has been associated with significant improvement in overall quality of life, self-image, physical symptoms, and general health in  studies assessing change in quality  of life.  PHQ-9: Review Flowsheet       05/17/2022  Depression screen PHQ 2/9  Decreased Interest 0  Down, Depressed, Hopeless 0  PHQ - 2 Score 0  Altered sleeping 1  Tired, decreased energy 0  Change in appetite 0  Feeling bad or failure about yourself  0  Trouble concentrating 1  Moving slowly or fidgety/restless 0  Suicidal thoughts 0  PHQ-9 Score 2  Difficult doing work/chores Not difficult at all   Interpretation of Total Score  Total Score Depression Severity:  1-4 = Minimal depression, 5-9 = Mild depression, 10-14 = Moderate depression, 15-19 = Moderately severe depression, 20-27 = Severe depression   Psychosocial Evaluation and Intervention:  Psychosocial Evaluation - 05/11/22 1413       Psychosocial Evaluation & Interventions   Interventions Encouraged to exercise with the program and follow exercise prescription    Comments Heliodoro has no barriers to attending the program. He lives with his wife,[name],of 30 years. She and their children are his support.  He does take Zoloft for history of anxiety. He would worry about others and get stressed.  He does have some short term memory concerns;diagnosis of mild dementia. He is ready to start the program and build up strength and energy.    Expected Outcomes STG Cali is able to attend all scheduled sessions. He is able to progress with his exercise  He is able to reduce any anxieties he has. LTG Kayn continues his exercise progression and keep anxiety reduced    Continue Psychosocial Services  Follow up required by staff             Psychosocial Re-Evaluation:  Psychosocial Re-Evaluation     Row Name 05/31/22 1348             Psychosocial Re-Evaluation   Current issues with None Identified       Comments Samier states he keeps busy- he has 2 children, 2 greatgranchildren, and a handful of gradnchildren. HE states his sleep is good and does not report any issues with it. His wife is good support and takes care  of him with cooking, medications, etc. He is very grateful for her. Denies big stressors. He likes to watch TV to help with stress, though denies currently experiencing any. Still taking Zolofts which works well. Per notes, he experiences some mild dementia. He states he has been enjoying the program so far and is excited to see how his results turn out after finishing. Denies any other concerns at this time.       Expected Outcomes Short: Continue coming to rehab for mood boost Long: Continue to maintain positive attitude and utilize exercise for stress/anxiety management       Interventions Encouraged to attend Cardiac Rehabilitation for the exercise       Continue Psychosocial Services  Follow up required by staff                Psychosocial Discharge (Final Psychosocial Re-Evaluation):  Psychosocial Re-Evaluation - 05/31/22 1348       Psychosocial Re-Evaluation   Current issues with None Identified    Comments Renji states he keeps busy- he has 2 children, 2 greatgranchildren, and a handful of gradnchildren. HE states his sleep is good and does not report any issues with it. His wife is good support and takes care of him with cooking, medications, etc. He is very grateful for her. Denies big stressors. He likes to  watch TV to help with stress, though denies currently experiencing any. Still taking Zolofts which works well. Per notes, he experiences some mild dementia. He states he has been enjoying the program so far and is excited to see how his results turn out after finishing. Denies any other concerns at this time.    Expected Outcomes Short: Continue coming to rehab for mood boost Long: Continue to maintain positive attitude and utilize exercise for stress/anxiety management    Interventions Encouraged to attend Cardiac Rehabilitation for the exercise    Continue Psychosocial Services  Follow up required by staff             Vocational Rehabilitation: Provide vocational rehab  assistance to qualifying candidates.   Vocational Rehab Evaluation & Intervention:  Vocational Rehab - 05/11/22 1406       Initial Vocational Rehab Evaluation & Intervention   Assessment shows need for Vocational Rehabilitation No      Vocational Rehab Re-Evaulation   Comments retired             Education: Education Goals: Education classes will be provided on a variety of topics geared toward better understanding of heart health and risk factor modification. Participant will state understanding/return demonstration of topics presented as noted by education test scores.  Learning Barriers/Preferences:  Learning Barriers/Preferences - 05/11/22 1404       Learning Barriers/Preferences   Learning Barriers Inability to learn new things   memory retention not good   Learning Preferences None             General Cardiac Education Topics:  AED/CPR: - Group verbal and written instruction with the use of models to demonstrate the basic use of the AED with the basic ABC's of resuscitation.   Anatomy and Cardiac Procedures: - Group verbal and visual presentation and models provide information about basic cardiac anatomy and function. Reviews the testing methods done to diagnose heart disease and the outcomes of the test results. Describes the treatment choices: Medical Management, Angioplasty, or Coronary Bypass Surgery for treating various heart conditions including Myocardial Infarction, Angina, Valve Disease, and Cardiac Arrhythmias.  Written material given at graduation. Flowsheet Row Cardiac Rehab from 06/14/2022 in [facility] Cardiac and Pulmonary Rehab  Education need identified 05/17/22       Medication Safety: - Group verbal and visual instruction to review commonly prescribed medications for heart and lung disease. Reviews the medication, class of the drug, and side effects. Includes the steps to properly store meds and maintain the prescription regimen.  Written material  given at graduation. Flowsheet Row Cardiac Rehab from 06/14/2022 in [facility] Cardiac and Pulmonary Rehab  Date 05/24/22  Educator MS  Instruction Review Code 1- Verbalizes Understanding       Intimacy: - Group verbal instruction through game format to discuss how heart and lung disease can affect sexual intimacy. Written material given at graduation..   Know Your Numbers and Heart Failure: - Group verbal and visual instruction to discuss disease risk factors for cardiac and pulmonary disease and treatment options.  Reviews associated critical values for Overweight/Obesity, Hypertension, Cholesterol, and Diabetes.  Discusses basics of heart failure: signs/symptoms and treatments.  Introduces Heart Failure Zone chart for action plan for heart failure.  Written material given at graduation. Flowsheet Row Cardiac Rehab from 06/14/2022 in [facility] Cardiac and Pulmonary Rehab  Date 05/31/22  Educator [facility]  Instruction Review Code 1- Verbalizes Understanding       Infection Prevention: - Provides verbal and written material to individual  with discussion of infection control including proper hand washing and proper equipment cleaning during exercise session. Flowsheet Row Cardiac Rehab from 06/14/2022 in [facility] Cardiac and Pulmonary Rehab  Date 05/17/22  Educator NT  Instruction Review Code 1- Verbalizes Understanding       Falls Prevention: - Provides verbal and written material to individual with discussion of falls prevention and safety. Flowsheet Row Cardiac Rehab from 06/14/2022 in [facility] Cardiac and Pulmonary Rehab  Date 05/11/22  Educator SB  Instruction Review Code 1- Verbalizes Understanding       Other: -Provides group and verbal instruction on various topics (see comments)   Knowledge Questionnaire Score:  Knowledge Questionnaire Score - 05/17/22 1524       Knowledge Questionnaire Score   Pre Score 22/26             Core Components/Risk Factors/Patient Goals at  Admission:  Personal Goals and Risk Factors at Admission - 05/17/22 1532       Core Components/Risk Factors/Patient Goals on Admission   Diabetes Yes    Intervention Provide education about signs/symptoms and action to take for hypo/hyperglycemia.;Provide education about proper nutrition, including hydration, and aerobic/resistive exercise prescription along with prescribed medications to achieve blood glucose in normal ranges: Fasting glucose 65-99 mg/dL    Expected Outcomes Short Term: Participant verbalizes understanding of the signs/symptoms and immediate care of hyper/hypoglycemia, proper foot care and importance of medication, aerobic/resistive exercise and nutrition plan for blood glucose control.;Long Term: Attainment of HbA1C < 7%.    Hypertension Yes    Intervention Provide education on lifestyle modifcations including regular physical activity/exercise, weight management, moderate sodium restriction and increased consumption of fresh fruit, vegetables, and low fat dairy, alcohol moderation, and smoking cessation.;Monitor prescription use compliance.    Expected Outcomes Short Term: Continued assessment and intervention until BP is < 140/81mm HG in hypertensive participants. < 130/64mm HG in hypertensive participants with diabetes, heart failure or chronic kidney disease.;Long Term: Maintenance of blood pressure at goal levels.    Lipids Yes    Intervention Provide education and support for participant on nutrition & aerobic/resistive exercise along with prescribed medications to achieve LDL 70mg , HDL >40mg .    Expected Outcomes Short Term: Participant states understanding of desired cholesterol values and is compliant with medications prescribed. Participant is following exercise prescription and nutrition guidelines.;Long Term: Cholesterol controlled with medications as prescribed, with individualized exercise RX and with personalized nutrition plan. Value goals: LDL < , HDL > 40 mg.              Education:Diabetes - Individual verbal and written instruction to review signs/symptoms of diabetes, desired ranges of glucose level fasting, after meals and with exercise. Acknowledge that pre and post exercise glucose checks will be done for 3 sessions at entry of program.   Core Components/Risk Factors/Patient Goals Review:   Goals and Risk Factor Review     Row Name 05/31/22 1344             Core Components/Risk Factors/Patient Goals Review   Personal Goals Review Diabetes;Lipids;Hypertension       Review Keeton states his wife helps check  his sugar. He is not able to tell me where the numbers have been but states he knows they have been good.They also check his blood pressure which ranges 110-120s/60s. Pressures at rehab are good, though he states a have been couple high coming in but think its from walking in and not resting all the way. He states he has been taking  his medications with no complaints.       Expected Outcomes Short: Continue to monitor sugars and BP closely, become aware of any abnormal values Long: Continue to manage lifestyle risk factors                Core Components/Risk Factors/Patient Goals at Discharge (Final Review):   Goals and Risk Factor Review - 05/31/22 1344       Core Components/Risk Factors/Patient Goals Review   Personal Goals Review Diabetes;Lipids;Hypertension    Review Rosa states his wife helps check  his sugar. He is not able to tell me where the numbers have been but states he knows they have been good.They also check his blood pressure which ranges 110-120s/60s. Pressures at rehab are good, though he states a have been couple high coming in but think its from walking in and not resting all the way. He states he has been taking his medications with no complaints.    Expected Outcomes Short: Continue to monitor sugars and BP closely, become aware of any abnormal values Long: Continue to manage lifestyle risk factors              ITP Comments:  ITP Comments     Row Name 05/11/22 1412 05/17/22 1523 05/22/22 1336 05/24/22 1524 06/21/22 0941   ITP Comments Virtual orientation call completed today. he has an appointment on Date: [id number]  for EP eval and gym Orientation.  Documentation of diagnosis [facility] be found in [facility] Date: 04/12/2022 . Completed and gym orientation. Initial ITP created and sent for review to Dr. Bethann Punches, Medical Director. First full day of exercise!  Patient was oriented to gym and equipment including functions, settings, policies, and procedures.  Patient's individual exercise prescription and treatment plan were reviewed.  All starting workloads were established based on the results of the 6 minute walk test done at initial orientation visit.  The plan for exercise progression was also introduced and progression will be customized based on patient's performance and goals. 30 day review completed. ITP sent to Dr. Bethann Punches, Medical Director of Cardiac Rehab. Continue with ITP unless changes are made by physician.   Pt is new to program. 30 Day review completed. Medical Director ITP review done, changes made as directed, and signed approval by Medical Director.    Row Name 06/21/22 1347 07/10/22 1408 07/19/22 1435       ITP Comments Averi is currently admitted with 4 broken ribs and some possible internal bleeding.  PT is thinking he will need to go to SNF at discharge.  We have placed him on medical hold for at least the next month. Mrs. Hasler called and left a message to for one of [place] to call back for an update on Oakley. He is doing better and eager to get home.  He is working with physical therapy at [facility] and starting to do some self care duties.  We talked about keeping [place] updated on his progress and to let us know when he is released and plans to return to rehab. 30 day review completed. ITP sent to Dr. Bethann Punches, Medical Director of Cardiac Rehab. Continue with ITP  unless changes are made by physician.  Abdulloh continues to be out on medical leave.              Comments: 30 day review

## 2022-07-25 ENCOUNTER — Encounter: Payer: Self-pay | Admitting: Cardiovascular Disease

## 2022-07-25 ENCOUNTER — Ambulatory Visit: Payer: PPO | Attending: Cardiovascular Disease | Admitting: Cardiovascular Disease

## 2022-07-25 VITALS — BP 128/60 | HR 70 | Ht 67.0 in | Wt 141.5 lb

## 2022-07-25 DIAGNOSIS — E785 Hyperlipidemia, unspecified: Secondary | ICD-10-CM | POA: Diagnosis not present

## 2022-07-25 DIAGNOSIS — I10 Essential (primary) hypertension: Secondary | ICD-10-CM

## 2022-07-25 DIAGNOSIS — I739 Peripheral vascular disease, unspecified: Secondary | ICD-10-CM

## 2022-07-25 DIAGNOSIS — I2510 Atherosclerotic heart disease of native coronary artery without angina pectoris: Secondary | ICD-10-CM | POA: Diagnosis not present

## 2022-07-25 DIAGNOSIS — I4719 Other supraventricular tachycardia: Secondary | ICD-10-CM

## 2022-07-25 DIAGNOSIS — I6523 Occlusion and stenosis of bilateral carotid arteries: Secondary | ICD-10-CM

## 2022-07-25 NOTE — Patient Instructions (Signed)
Medication Instructions:  No changes *If you need a refill on your cardiac medications before your next appointment, please call your pharmacy*   Lab Work: None ordered If you have labs (blood work) drawn today and your tests are completely normal, you will receive your results only by: MyChart Message (if you have MyChart) OR A paper copy in the mail If you have any lab test that is abnormal or we need to change your treatment, we will call you to review the results.   Testing/Procedures: None ordered   Follow-Up: At [facility], you and your health needs are our priority.  As part of our continuing mission to provide you with exceptional heart care, we have created designated Provider Care Teams.  These Care Teams include your primary Cardiologist (physician) and Advanced Practice Providers (APPs -  Physician Assistants and Nurse Practitioners) who all work together to provide you with the care you need, when you need it.  We recommend signing up for the patient portal called "MyChart".  Sign up information is provided on this After Visit Summary.  MyChart is used to connect with patients for Virtual Visits (Telemedicine).  Patients are able to view lab/test results, encounter notes, upcoming appointments, etc.  Non-urgent messages can be sent to your provider as well.   To learn more about what you can do with MyChart, go to [email or web address].    Your next appointment:   3 months with Eula Listen, PA 6 months with Dr. Kirke Corin

## 2022-07-25 NOTE — Progress Notes (Signed)
Cardiology Office Note   Date:  07/25/2022   ID:  Raymond Sampson, DOB [birth year], MRN [id number]  PCP:  Gracelyn Nurse, MD  Cardiologist:  Dr. Mariah Milling  Chief Complaint  Patient presents with   Consult    PV Consult c/o edema bilateral feet. Meds reviewed verbally with pt.      History of Present Illness: Raymond Sampson is a 82 y.o. male who was referred by Eula Listen for evaluation and management of peripheral arterial disease. He has known history of coronary artery disease status post myocardial infarction twice most recently in January 2024 status post PCI to the LAD, RCA as well as left circumflex, previous CVA in 2012, atrial tachycardia, bilateral carotid artery disease, dementia, type 2 diabetes, essential hypertension, hyperlipidemia and prior tobacco use. He was hospitalized in January with non-ST elevation myocardial infarction.  Cardiac catheterization showed severe three-vessel coronary artery disease.  Given his age and comorbidities, he was not a candidate for CABG.  He underwent multivessel PCI. He was hospitalized in March after a fall in the bathtub which resulted in right-sided rib fractures, intramural hematoma within the paraspinal neck musculature and small right-sided pneumothorax.  Plavix was initially held but then resumed.  He did have chest pain with elevated troponin but conservative management was recommended.  He did have blood loss anemia which required 1 unit of packed RBC transfusion.  He had noninvasive vascular studies in March which showed an ABI of 0.95 on the right and 0.65 on the left.  Duplex on the right showed borderline significant popliteal artery stenosis.  On the left, there was moderate stenosis affecting the common femoral artery with severe stenosis in the distal SFA.  His mobility is limited and he walks with a walker.  He was having some calf claudication but not recently.  No lower extremity ulceration.  He denies chest pain or  shortness of breath.  Past Medical History:  Diagnosis Date   Arthritic-like pain    CAD (coronary artery disease)    a. apical MI 5/07 with LHC showing 50% pCFX, 30% pRCA, 99% pLAD, 99% mLAD, 75% dLAD (cypher DES x3 to LAD); b. ETT-myoview (12/08) 81% MPHR,  EF 64%, partially reversible inferoapical perfusion defect similar to prior study 12/07; c. 04/2016 MV: EF 59%, fixed anteroapical defect, no ischemia->Low risk.   Carotid arterial disease    a. 05/2016 Carotid U/S: 30-40% bilat dzs, f/u in 2 yrs.   Chest pain 04/13/2022   CVA (cerebral vascular accident)    a. 07/2010 - h/o CVA/TIA.   Diet-controlled type 2 diabetes mellitus    HLD (hyperlipidemia)    HTN (hypertension)    PAT (paroxysmal atrial tachycardia)    a. 06/2016 Event monitor:  rare episodes of atrial tachycardia, longest 7 beats. No afib.    Past Surgical History:  Procedure Laterality Date   BACK SURGERY     CARDIAC CATHETERIZATION     CORONARY ANGIOPLASTY WITH STENT PLACEMENT     apical MI 5/07 with LHC showing 50% pCFX, 30% pRCA, 99% pLAD, 9%% mLAD, 75% dLAD. cypher DES x3 to LAD. ETT-myoview (12/08) 81% MPHR, 8'1, EF 64%, partially reversible inferoapical perfusion defect similar to prior study 12/07   CORONARY IMAGING/OCT N/A 04/14/2022   Procedure: INTRAVASCULAR IMAGING/OCT;  Surgeon: Yvonne Kendall, MD;  Location: [facility];  Service: Cardiovascular;  Laterality: N/A;   CORONARY STENT INTERVENTION N/A 04/14/2022   Procedure: CORONARY STENT INTERVENTION;  Surgeon: Yvonne Kendall, MD;  Location: [facility];  Service: Cardiovascular;  Laterality: N/A;   CORONARY STENT INTERVENTION Left 05/02/2022   Procedure: CORONARY STENT INTERVENTION;  Surgeon: Yvonne Kendall, MD;  Location: [facility];  Service: Cardiovascular;  Laterality: Left;   LEFT HEART CATH AND CORONARY ANGIOGRAPHY N/A 04/13/2022   Procedure: LEFT HEART CATH AND CORONARY ANGIOGRAPHY;  Surgeon: Marykay Lex, MD;   Location: [facility];  Service: Cardiovascular;  Laterality: N/A;     Current Outpatient Medications  Medication Sig Dispense Refill   aspirin 81 MG tablet Take 81 mg by mouth daily.     atorvastatin (LIPITOR) 80 MG tablet Take 1 tablet (80 mg total) by mouth daily. 30 tablet 2   clopidogrel (PLAVIX) 75 MG tablet TAKE 1 TABLET BY MOUTH EVERY DAY WITH BREAKFAST 90 tablet 0   cyanocobalamin 1000 MCG tablet Take 1,000 mcg by mouth daily.     feeding supplement (ENSURE ENLIVE / ENSURE PLUS) LIQD Take 237 mLs by mouth 3 (three) times daily between meals. 237 mL 12   lisinopril (ZESTRIL) 20 MG tablet TAKE 1 TABLET BY MOUTH EVERY DAY 90 tablet 0   metFORMIN (GLUCOPHAGE) 1000 MG tablet Take 1 tablet (1,000 mg total) by mouth 2 (two) times daily with a meal. Resume 05/05/2022     metoprolol tartrate (LOPRESSOR) 25 MG tablet TAKE 1 TABLET BY MOUTH TWICE A DAY 180 tablet 0   pantoprazole (PROTONIX) 20 MG tablet Take 1 tablet (20 mg total) by mouth daily. 30 tablet 2   sertraline (ZOLOFT) 25 MG tablet Take 1 tablet (25 mg total) by mouth daily.     sitaGLIPtin (JANUVIA) 25 MG tablet Take 1 tablet by mouth daily.     amLODipine (NORVASC) 5 MG tablet Take 1 tablet (5 mg total) by mouth daily. (Patient not taking: Reported on 07/25/2022)     oxyCODONE (OXY IR/ROXICODONE) 5 MG immediate release tablet Take 0.5-1 tablets (2.5-5 mg total) by mouth every 6 (six) hours as needed for severe pain or moderate pain. (Patient not taking: Reported on 07/25/2022) 20 tablet 0   senna-docusate (SENOKOT-S) 8.6-50 MG tablet Take 2 tablets by mouth at bedtime as needed for moderate constipation. (Patient not taking: Reported on 07/25/2022)     No current facility-administered medications for this visit.    Allergies:   Metformin and related and Simvastatin    Social History:  The patient  reports that he quit smoking about 37 years ago. His smoking use included cigarettes. He has a 25.00 pack-year smoking history.  He has never used smokeless tobacco. He reports that he does not drink alcohol and does not use drugs.   Family History:  The patient's Family history is unknown by patient.    ROS:  Please see the history of present illness.   Otherwise, review of systems are positive for none.   All other systems are reviewed and negative.    PHYSICAL EXAM: VS:  BP 128/60 (BP Location: Left Arm, Patient Position: Sitting, Cuff Size: Normal)   Pulse 70   Ht  (1.702 m)   Wt 141 lb 8 oz (64.2 kg)   SpO2 94%   BMI 22.16 kg/m  , BMI Body mass index is 22.16 kg/m. GEN: Well nourished, well developed, in no acute distress  HEENT: normal  Neck: no JVD, carotid bruits, or masses Cardiac: RRR; no murmurs, rubs, or gallops,no edema  Respiratory:  clear to auscultation bilaterally, normal work of breathing GI: soft, nontender, nondistended, +  BS MS: no deformity or atrophy  Skin: warm and dry, no rash Neuro:  Strength and sensation are intact Psych: euthymic mood, full affect   EKG:  EKG is not ordered today.    Recent Labs: 06/20/2022: ALT 21 07/01/2022: BUN 30; Creatinine, Ser 0.91; Potassium 4.3; Sodium 135 07/02/2022: Hemoglobin 9.9; Magnesium 2.0; Platelets 526    Lipid Panel    Component Value Date/Time   CHOL 205 (H) 04/13/2022 0430   CHOL 132 05/29/2019 1113   CHOL 138 12/28/2012 0506   TRIG 257 (H) 04/13/2022 0430   TRIG 197 12/28/2012 0506   TRIG 111 08/06/2008 0000   HDL 40 (L) 04/13/2022 0430   HDL 34 (L) 05/29/2019 1113   HDL 34 (L) 12/28/2012 0506   CHOLHDL 5.1 04/13/2022 0430   VLDL 51 (H) 04/13/2022 0430   VLDL 39 12/28/2012 0506   LDLCALC 114 (H) 04/13/2022 0430   LDLCALC 64 05/29/2019 1113   LDLCALC 65 12/28/2012 0506      Wt Readings from Last 3 Encounters:  07/25/22 141 lb 8 oz (64.2 kg)  07/01/22 142 lb 6.7 oz (64.6 kg)  05/17/22 152 lb (68.9 kg)          No data to display            ASSESSMENT AND PLAN:  1.  Peripheral arterial disease:  Moderately reduced ABI on the left side due to SFA and popliteal disease.  However, he is currently not having any claudication likely due to decreased mobility.  No evidence of critical limb ischemia.  I recommend continuing medical therapy.  Angiography and revascularization will be reserved for worsening leg ischemia.  2.  Coronary artery disease involving native coronary arteries without angina, is doing reasonably well.  No anginal symptoms.  Continue dual antiplatelet therapy.  3.  Essential hypertension: Blood pressure is controlled on current medications.  4.  Hyperlipidemia: Continue atorvastatin with a target LDL of less than 70.  5.  Atrial tachycardia: Controlled with metoprolol.  6.  Bilateral carotid disease: This was mild on recent carotid Doppler.    Disposition:   FU with me in 6 months  Signed,  Lorine Bears, MD  07/25/2022 3:24 PM    [facility]

## 2022-08-07 ENCOUNTER — Telehealth: Payer: Self-pay

## 2022-08-07 DIAGNOSIS — Z955 Presence of coronary angioplasty implant and graft: Secondary | ICD-10-CM

## 2022-08-07 DIAGNOSIS — I214 Non-ST elevation (NSTEMI) myocardial infarction: Secondary | ICD-10-CM

## 2022-08-07 NOTE — Telephone Encounter (Signed)
Spoke with patient and wife- patient d/c from SNF and now currently receiving home PT and OT. Once patient is completed with home health, will need clearance to come back to cardiac rehab. Stated he has been using a walker and if he continues to use it, will need to bring that with him to rehab going forward. They will keep [place] updated on what is going on. Continue medical hold.

## 2022-08-09 ENCOUNTER — Ambulatory Visit: Payer: PPO | Admitting: Physician Assistant

## 2022-08-16 ENCOUNTER — Encounter: Payer: Self-pay | Admitting: *Deleted

## 2022-08-16 DIAGNOSIS — I214 Non-ST elevation (NSTEMI) myocardial infarction: Secondary | ICD-10-CM

## 2022-08-16 DIAGNOSIS — Z955 Presence of coronary angioplasty implant and graft: Secondary | ICD-10-CM

## 2022-08-16 NOTE — Progress Notes (Signed)
Cardiac Individual Treatment Plan  Patient Details  Name: Raymond Sampson MRN: [id number] Date of Birth: 09-09-1940 Referring Provider:   Flowsheet Row Cardiac Rehab from 05/17/2022 in [facility] Cardiac and Pulmonary Rehab  Referring Provider Dr Yvonne Kendall, MD       Initial Encounter Date:  Flowsheet Row Cardiac Rehab from 05/17/2022 in [facility] Cardiac and Pulmonary Rehab  Date 05/17/22       Visit Diagnosis: NSTEMI (non-ST elevation myocardial infarction) [facility])  Status post coronary artery stent placement  Patient's Home Medications on Admission:  Current Outpatient Medications:    amLODipine (NORVASC) 5 MG tablet, Take 1 tablet (5 mg total) by mouth daily. (Patient not taking: Reported on 07/25/2022), Disp: , Rfl:    aspirin 81 MG tablet, Take 81 mg by mouth daily., Disp: , Rfl:    atorvastatin (LIPITOR) 80 MG tablet, Take 1 tablet (80 mg total) by mouth daily., Disp: 30 tablet, Rfl: 2   clopidogrel (PLAVIX) 75 MG tablet, TAKE 1 TABLET BY MOUTH EVERY DAY WITH BREAKFAST, Disp: 90 tablet, Rfl: 0   cyanocobalamin 1000 MCG tablet, Take 1,000 mcg by mouth daily., Disp: , Rfl:    feeding supplement (ENSURE ENLIVE / ENSURE PLUS) LIQD, Take 237 mLs by mouth 3 (three) times daily between meals., Disp: 237 mL, Rfl: 12   lisinopril (ZESTRIL) 20 MG tablet, TAKE 1 TABLET BY MOUTH EVERY DAY, Disp: 90 tablet, Rfl: 0   metFORMIN (GLUCOPHAGE) 1000 MG tablet, Take 1 tablet (1,000 mg total) by mouth 2 (two) times daily with a meal. Resume 05/05/2022, Disp: , Rfl:    metoprolol tartrate (LOPRESSOR) 25 MG tablet, TAKE 1 TABLET BY MOUTH TWICE A DAY, Disp: 180 tablet, Rfl: 0   oxyCODONE (OXY IR/ROXICODONE) 5 MG immediate release tablet, Take 0.5-1 tablets (2.5-5 mg total) by mouth every 6 (six) hours as needed for severe pain or moderate pain. (Patient not taking: Reported on 07/25/2022), Disp: 20 tablet, Rfl: 0   pantoprazole (PROTONIX) 20 MG tablet, Take 1 tablet (20 mg total) by mouth daily., Disp: 30  tablet, Rfl: 2   senna-docusate (SENOKOT-S) 8.6-50 MG tablet, Take 2 tablets by mouth at bedtime as needed for moderate constipation. (Patient not taking: Reported on 07/25/2022), Disp: , Rfl:    sertraline (ZOLOFT) 25 MG tablet, Take 1 tablet (25 mg total) by mouth daily., Disp: , Rfl:    sitaGLIPtin (JANUVIA) 25 MG tablet, Take 1 tablet by mouth daily., Disp: , Rfl:   Past Medical History: Past Medical History:  Diagnosis Date   Arthritic-like pain    CAD (coronary artery disease)    a. apical MI 5/07 with LHC showing 50% pCFX, 30% pRCA, 99% pLAD, 99% mLAD, 75% dLAD (cypher DES x3 to LAD); b. ETT-myoview (12/08) 81% MPHR,  EF 64%, partially reversible inferoapical perfusion defect similar to prior study 12/07; c. 04/2016 MV: EF 59%, fixed anteroapical defect, no ischemia->Low risk.   Carotid arterial disease (HCC)    a. 05/2016 Carotid U/S: 30-40% bilat dzs, f/u in 2 yrs.   Chest pain 04/13/2022   CVA (cerebral vascular accident) [facility])    a. 07/2010 - h/o CVA/TIA.   Diet-controlled type 2 diabetes mellitus (HCC)    HLD (hyperlipidemia)    HTN (hypertension)    PAT (paroxysmal atrial tachycardia)    a. 06/2016 Event monitor:  rare episodes of atrial tachycardia, longest 7 beats. No afib.    Tobacco Use: Social History   Tobacco Use  Smoking Status Former   Packs/day: 1.00  Years: 25.00   Additional pack years: 0.00   Total pack years: 25.00   Types: Cigarettes   Quit date: 04/17/1985   Years since quitting: 37.3  Smokeless Tobacco Never    Labs: Review Flowsheet  More data exists      Latest Ref Rng & Units 12/28/2012 05/29/2019 04/12/2022 04/13/2022 07/02/2022  Labs for ITP Cardiac and Pulmonary Rehab  Cholestrol 0 - 200 mg/dL 161  096  - 045  -  LDL (calc) 0 - 99 mg/dL 65  64  - 409  -  HDL-C >40 mg/dL 34  34  - 40  -  Trlycerides <150 mg/dL 811  914  - 782  -  Hemoglobin A1c 4.8 - 5.6 % - - 6.8  - -  PH, Arterial 7.35 - 7.45 - - - - 7.47  C  PCO2 arterial 32 - 48 mmHg - -  - - 40  C  Bicarbonate 20.0 - 28.0 mmol/L - - - - 29.1  C  O2 Saturation % - - - - 97.1  C    Details      C Corrected result          Exercise Target Goals: Exercise Program Goal: Individual exercise prescription set using results from initial 6 min walk test and THRR while considering  patient's activity barriers and safety.   Exercise Prescription Goal: Initial exercise prescription builds to 30-45 minutes a day of aerobic activity, 2-3 days per week.  Home exercise guidelines will be given to patient during program as part of exercise prescription that the participant will acknowledge.   Education: Aerobic Exercise: - Group verbal and visual presentation on the components of exercise prescription. Introduces F.I.T.T principle from ACSM for exercise prescriptions.  Reviews F.I.T.T. principles of aerobic exercise including progression. Written material given at graduation. Flowsheet Row Cardiac Rehab from 06/14/2022 in [facility] Cardiac and Pulmonary Rehab  Education need identified 05/17/22       Education: Resistance Exercise: - Group verbal and visual presentation on the components of exercise prescription. Introduces F.I.T.T principle from ACSM for exercise prescriptions  Reviews F.I.T.T. principles of resistance exercise including progression. Written material given at graduation.    Education: Exercise & Equipment Safety: - Individual verbal instruction and demonstration of equipment use and safety with use of the equipment. Flowsheet Row Cardiac Rehab from 06/14/2022 in [facility] Cardiac and Pulmonary Rehab  Date 05/17/22  Educator NT  Instruction Review Code 1- Verbalizes Understanding       Education: Exercise Physiology & General Exercise Guidelines: - Group verbal and written instruction with models to review the exercise physiology of the cardiovascular system and associated critical values. Provides general exercise guidelines with specific guidelines to those with  heart or lung disease.    Education: Flexibility, Balance, Mind/Body Relaxation: - Group verbal and visual presentation with interactive activity on the components of exercise prescription. Introduces F.I.T.T principle from ACSM for exercise prescriptions. Reviews F.I.T.T. principles of flexibility and balance exercise training including progression. Also discusses the mind body connection.  Reviews various relaxation techniques to help reduce and manage stress (i.e. Deep breathing, progressive muscle relaxation, and visualization). Balance handout provided to take home. Written material given at graduation.   Activity Barriers & Risk Stratification:  Activity Barriers & Cardiac Risk Stratification - 05/17/22 1538       Activity Barriers & Cardiac Risk Stratification   Activity Barriers None    Cardiac Risk Stratification High  6 Minute Walk:  6 Minute Walk     Row Name 05/17/22 1533         6 Minute Walk   Phase Initial     Distance 815 feet     Walk Time 5.78 minutes     # of Rest Breaks 2     MPH 1.54     METS 2.02     RPE 12     Perceived Dyspnea  0     VO2 Peak 7.06     Symptoms No     Resting HR 67 bpm     Resting BP 142/60     Resting Oxygen Saturation  100 %     Exercise Oxygen Saturation  during 6 min walk 95 %     Max Ex. HR 96 bpm     Max Ex. BP 172/74     2 Minute Post BP 160/68              Oxygen Initial Assessment:   Oxygen Re-Evaluation:   Oxygen Discharge (Final Oxygen Re-Evaluation):   Initial Exercise Prescription:  Initial Exercise Prescription - 05/17/22 1500       Date of Initial Exercise RX and Referring Provider   Date 05/17/22    Referring Provider Dr Yvonne Kendall, MD      Oxygen   Maintain Oxygen Saturation 88% or higher      Treadmill   MPH 1.2    Grade 0    Minutes 15    METs 1.92      Recumbant Bike   Level 1    RPM 50    Watts 15    Minutes 15    METs 2.02      NuStep   Level 1    SPM  80    Minutes 15    METs 2.02      Prescription Details   Frequency (times per week) 3    Duration Progress to 30 minutes of continuous aerobic without signs/symptoms of physical distress      Intensity   THRR 40-80% of Max Heartrate 95-124    Ratings of Perceived Exertion 11-13    Perceived Dyspnea 0-4      Progression   Progression Continue to progress workloads to maintain intensity without signs/symptoms of physical distress.      Resistance Training   Training Prescription Yes    Weight 3 lb    Reps 10-15             Perform Capillary Blood Glucose checks as needed.  Exercise Prescription Changes:   Exercise Prescription Changes     Row Name 05/17/22 1500 06/01/22 1400 06/13/22 1300 06/14/22 1300 06/29/22 1300     Response to Exercise   Blood Pressure (Admit) 142/60 110/64 132/62 -- 142/68   Blood Pressure (Exercise) 172/74 140/70 128/70 -- 128/70   Blood Pressure (Exit) 160/68 104/66 122/62 -- 118/60   Heart Rate (Admit) 67 bpm 64 bpm 69 bpm -- 68 bpm   Heart Rate (Exercise) 96 bpm 111 bpm 94 bpm -- 87 bpm   Heart Rate (Exit) 70 bpm 76 bpm 69 bpm -- 68 bpm   Oxygen Saturation (Admit) 100 % -- -- -- --   Oxygen Saturation (Exercise) 95 % -- -- -- --   Rating of Perceived Exertion (Exercise) 12 14 15  -- 12   Perceived Dyspnea (Exercise) 0 -- -- -- --   Symptoms none none none -- none   Comments  Results First three days of exercise -- -- --   Duration -- Progress to 30 minutes of  aerobic without signs/symptoms of physical distress Progress to 30 minutes of  aerobic without signs/symptoms of physical distress -- Progress to 30 minutes of  aerobic without signs/symptoms of physical distress   Intensity -- THRR unchanged THRR unchanged -- THRR unchanged     Progression   Progression -- Continue to progress workloads to maintain intensity without signs/symptoms of physical distress. Continue to progress workloads to maintain intensity without signs/symptoms of  physical distress. -- Continue to progress workloads to maintain intensity without signs/symptoms of physical distress.   Average METs -- 1.4 1.57 -- 1.8     Resistance Training   Training Prescription -- Yes Yes -- Yes   Weight -- 3 lb 3 lb -- 3 lb   Reps -- 10-15 10-15 -- 10-15     Interval Training   Interval Training -- No No -- No     Treadmill   MPH -- -- 0.7 -- --   Grade -- -- 0 -- --   Minutes -- -- 15 -- --   METs -- -- 1.5 -- --     NuStep   Level -- 1 3 -- 3   Minutes -- 15 15 -- 30   METs -- 1.8 1.8 -- 1.8     T5 Nustep   Level -- 1 -- -- --   Minutes -- 15 -- -- --   METs -- 1.7 -- -- --     Biostep-RELP   Level -- 1 1 -- 1   Minutes -- 15 15 -- 15   METs -- 1 -- -- 2     Track   Laps -- 7 6 -- --   Minutes -- 15 15 -- --   METs -- 1.38 1.33 -- --     Home Exercise Plan   Plans to continue exercise at -- -- -- Home (comment)  walking Home (comment)  walking   Frequency -- -- -- Add 2 additional days to program exercise sessions. Add 2 additional days to program exercise sessions.   Initial Home Exercises Provided -- -- -- 06/14/22 06/14/22     Oxygen   Maintain Oxygen Saturation -- 88% or higher 88% or higher -- 88% or higher            Exercise Comments:   Exercise Comments     Row Name 05/22/22 1336           Exercise Comments First full day of exercise!  Patient was oriented to gym and equipment including functions, settings, policies, and procedures.  Patient's individual exercise prescription and treatment plan were reviewed.  All starting workloads were established based on the results of the 6 minute walk test done at initial orientation visit.  The plan for exercise progression was also introduced and progression will be customized based on patient's performance and goals.                Exercise Goals and Review:   Exercise Goals     Row Name 05/17/22 1538             Exercise Goals   Increase Physical Activity Yes        Intervention Provide advice, education, support and counseling about physical activity/exercise needs.;Develop an individualized exercise prescription for aerobic and resistive training based on initial evaluation findings, risk stratification, comorbidities and participant's personal goals.  Expected Outcomes Short Term: Attend rehab on a regular basis to increase amount of physical activity.;Long Term: Add in home exercise to make exercise part of routine and to increase amount of physical activity.;Long Term: Exercising regularly at least 3-5 days a week.       Increase Strength and Stamina Yes       Intervention Develop an individualized exercise prescription for aerobic and resistive training based on initial evaluation findings, risk stratification, comorbidities and participant's personal goals.;Provide advice, education, support and counseling about physical activity/exercise needs.       Expected Outcomes Short Term: Perform resistance training exercises routinely during rehab and add in resistance training at home;Long Term: Improve cardiorespiratory fitness, muscular endurance and strength as measured by increased METs and functional capacity ( );Short Term: Increase workloads from initial exercise prescription for resistance, speed, and METs.       Able to understand and use rate of perceived exertion (RPE) scale Yes       Intervention Provide education and explanation on how to use RPE scale       Expected Outcomes Short Term: Able to use RPE daily in rehab to express subjective intensity level;Long Term:  Able to use RPE to guide intensity level when exercising independently       Able to understand and use Dyspnea scale Yes       Intervention Provide education and explanation on how to use Dyspnea scale       Expected Outcomes Short Term: Able to use Dyspnea scale daily in rehab to express subjective sense of shortness of breath during exertion;Long Term: Able to use Dyspnea  scale to guide intensity level when exercising independently       Knowledge and understanding of Target Heart Rate Range (THRR) Yes       Intervention Provide education and explanation of THRR including how the numbers were predicted and where they are located for reference       Expected Outcomes Long Term: Able to use THRR to govern intensity when exercising independently;Short Term: Able to state/look up THRR;Short Term: Able to use daily as guideline for intensity in rehab       Able to check pulse independently Yes       Intervention Review the importance of being able to check your own pulse for safety during independent exercise;Provide education and demonstration on how to check pulse in carotid and radial arteries.       Expected Outcomes Short Term: Able to explain why pulse checking is important during independent exercise;Long Term: Able to check pulse independently and accurately       Understanding of Exercise Prescription Yes       Intervention Provide education, explanation, and written materials on patient's individual exercise prescription       Expected Outcomes Short Term: Able to explain program exercise prescription;Long Term: Able to explain home exercise prescription to exercise independently                Exercise Goals Re-Evaluation :  Exercise Goals Re-Evaluation     Row Name 05/22/22 1337 05/31/22 1357 06/01/22 1528 06/13/22 1354 06/14/22 1345     Exercise Goal Re-Evaluation   Exercise Goals Review Increase Physical Activity;Able to understand and use rate of perceived exertion (RPE) scale;Knowledge and understanding of Target Heart Rate Range (THRR);Understanding of Exercise Prescription;Increase Strength and Stamina;Able to understand and use Dyspnea scale Increase Physical Activity;Increase Strength and Stamina;Understanding of Exercise Prescription Increase Physical Activity;Increase Strength and Stamina;Understanding of Exercise  Prescription Increase Physical  Activity;Increase Strength and Stamina;Understanding of Exercise Prescription Increase Physical Activity;Increase Strength and Stamina;Understanding of Exercise Prescription;Able to understand and use rate of perceived exertion (RPE) scale;Knowledge and understanding of Target Heart Rate Range (THRR);Able to understand and use Dyspnea scale;Able to check pulse independently   Comments Reviewed RPE scale, THR and program prescription with pt today.  Pt voiced understanding and was given a copy of goals to take home. Jarl is doing well. Stafff will hold off on going over home exercise as patient just started the program. Explained the importance of starting home exercise and staying compliant with it during rehab for positive results. Encouraged patient to always have a buddy with him with exercise, he states his wife will be with him and they plan on taking walks together. He does not use a cane or walker. Discussed importance checking HR at home as well. Daevin is off to a good start in the program. He had an average MET level of 1.4 METs during his first week in the program. He also did level one on the T4 and T5 nustep as well as the biostep. He was only able to walk the track for 10 minutes and got seven laps. We will continue to monitor his progress in the program. Darvel continues to do well in rehab. He tried out the treadmill and was able to walk at a 0.7 mph speed. He also increased to level 3 on the T4 Nustep! He has been hitting his THR only some sessions. We hope to see his walking improve over time. RPEs are appropriate. Will continue to monitor. Reviewed home exercise with pt today.  Pt plans to walk around yard at home for exercise.  Reviewed THR, pulse, RPE, sign and symptoms, pulse oximetery and when to call 911 or MD.  Also discussed weather considerations and indoor options.  Pt voiced understanding.   Expected Outcomes Short: Use RPE daily to regulate intensity.  Long: Follow program  prescription in THR. ShorT: EP to go over home exercise Long: Exercise independently at home at appropriate prescription Short: Continue to work towards 20 minutes of walking. Long: Continue to follow exercise prescription. Short: Increase laps on track, fulfill 20 minutes of walking all together Long: Continue to increase overall MET level and stamina Short: Start to add in more walking at home Long: continue to exercise independently    Row Name 06/29/22 1355 07/12/22 1650 07/25/22 0810 08/08/22 1552       Exercise Goal Re-Evaluation   Exercise Goals Review Increase Physical Activity;Increase Strength and Stamina;Understanding of Exercise Prescription Increase Physical Activity;Increase Strength and Stamina;Understanding of Exercise Prescription Increase Physical Activity;Increase Strength and Stamina;Understanding of Exercise Prescription Increase Physical Activity;Increase Strength and Stamina;Understanding of Exercise Prescription    Comments Calvion is doing well in the program. He only attended rehab twice since the last review due to him taking a fall at home and having to call out. During his last two sessions he was able to increase his overall MET level to 1.8 METs. He also was able to do 30 minutes on the T4 nustep at level 3. We will encourage him to try some walking when he returns to the program. We will continue to monitor his progress in the program. Braven has not been in the program since last review. He has been placed on a medical hold as he had a fall and is now completing PT. We will continue to receive updates from patient. Chade has not attended rehab  since 06/14/2022. He has been placed on a medical hold as he had a fall and is now completing PT. PT's wife recently gave update that he continues to improve and is looking forward to going home. We will continue to receive updates from patient. Amaurion has not been here since 3/13. He was discharge from SNF but is now receiving home  health OT and PT. Him and his wife are aware he needs to finish that until he returns to use at rehab with clearance.    Expected Outcomes Short: Return to regular attendance in rehab when ready. Long: Continue to improve strength and stamina. Short: Return to rehab when cleared Long: Graduate from [employer]: Return to rehab when cleared. Long: Graduate from [employer]. Short: Finish OT/PT Long: Graduate from [facility]             Discharge Exercise Prescription (Final Exercise Prescription Changes):  Exercise Prescription Changes - 06/29/22 1300       Response to Exercise   Blood Pressure (Admit) 142/68    Blood Pressure (Exercise) 128/70    Blood Pressure (Exit) 118/60    Heart Rate (Admit) 68 bpm    Heart Rate (Exercise) 87 bpm    Heart Rate (Exit) 68 bpm    Rating of Perceived Exertion (Exercise) 12    Symptoms none    Duration Progress to 30 minutes of  aerobic without signs/symptoms of physical distress    Intensity THRR unchanged      Progression   Progression Continue to progress workloads to maintain intensity without signs/symptoms of physical distress.    Average METs 1.8      Resistance Training   Training Prescription Yes    Weight 3 lb    Reps 10-15      Interval Training   Interval Training No      NuStep   Level 3    Minutes 30    METs 1.8      Biostep-RELP   Level 1    Minutes 15    METs 2      Home Exercise Plan   Plans to continue exercise at Home (comment)   walking   Frequency Add 2 additional days to program exercise sessions.    Initial Home Exercises Provided 06/14/22      Oxygen   Maintain Oxygen Saturation 88% or higher             Nutrition:  Target Goals: Understanding of nutrition guidelines, daily intake of sodium 1500mg , cholesterol 200mg , calories 30% from fat and 7% or less from saturated fats, daily to have 5 or more servings of fruits and vegetables.  Education: All About Nutrition: -Group instruction  provided by verbal, written material, interactive activities, discussions, models, and posters to present general guidelines for heart healthy nutrition including fat, fiber, MyPlate, the role of sodium in heart healthy nutrition, utilization of the nutrition label, and utilization of this knowledge for meal planning. Follow up email sent as well. Written material given at graduation.   Biometrics:  Pre Biometrics - 05/17/22 1538       Pre Biometrics   Height 5' 8.5" (1.74 m)    Weight 152 lb (68.9 kg)    Waist Circumference 36 inches    Hip Circumference 36 inches    Waist to Hip Ratio 1 %    BMI (Calculated) 22.77    Single Leg Stand 1.6 seconds              Nutrition  Therapy Plan and Nutrition Goals:  Nutrition Therapy & Goals - 05/31/22 1352       Nutrition Therapy   RD appointment deferred Yes   Deferred            Nutrition Assessments:  MEDIFICTS Score Key: ?70 Need to make dietary changes  40-70 Heart Healthy Diet ? 40 Therapeutic Level Cholesterol Diet  Flowsheet Row Cardiac Rehab from 05/17/2022 in [facility] Cardiac and Pulmonary Rehab  Picture Your Plate Total Score on Admission 56      Picture Your Plate Scores: <81 Unhealthy dietary pattern with much room for improvement. 41-50 Dietary pattern unlikely to meet recommendations for good health and room for improvement. 51-60 More healthful dietary pattern, with some room for improvement.  >60 Healthy dietary pattern, although there may be some specific behaviors that could be improved.    Nutrition Goals Re-Evaluation:  Nutrition Goals Re-Evaluation     Row Name 05/31/22 1410             Goals   Nutrition Goal Patient deferred nutrition at this time.                Nutrition Goals Discharge (Final Nutrition Goals Re-Evaluation):  Nutrition Goals Re-Evaluation - 05/31/22 1410       Goals   Nutrition Goal Patient deferred nutrition at this time.             Psychosocial: Target  Goals: Acknowledge presence or absence of significant depression and/or stress, maximize coping skills, provide positive support system. Participant is able to verbalize types and ability to use techniques and skills needed for reducing stress and depression.   Education: Stress, Anxiety, and Depression - Group verbal and visual presentation to define topics covered.  Reviews how body is impacted by stress, anxiety, and depression.  Also discusses healthy ways to reduce stress and to treat/manage anxiety and depression.  Written material given at graduation. Flowsheet Row Cardiac Rehab from 06/14/2022 in [facility] Cardiac and Pulmonary Rehab  Date 06/14/22  Educator KW  Instruction Review Code 1- [employer] Understanding       Education: Sleep Hygiene -Provides group verbal and written instruction about how sleep can affect your health.  Define sleep hygiene, discuss sleep cycles and impact of sleep habits. Review good sleep hygiene tips.    Initial Review & Psychosocial Screening:  Initial Psych Review & Screening - 05/11/22 1358       Initial Review   Current issues with Current Psychotropic Meds;Current Anxiety/Panic      Family Dynamics   Good Support System? Yes   wife,daughter in law,   all children   Comments would have anxiety      Barriers   Psychosocial barriers to participate in program There are no identifiable barriers or psychosocial needs.      Screening Interventions   Interventions Encouraged to exercise;To provide support and resources with identified psychosocial needs;Provide feedback about the scores to participant    Expected Outcomes Short Term goal: Utilizing psychosocial counselor, staff and physician to assist with identification of specific Stressors or current issues interfering with healing process. Setting desired goal for each stressor or current issue identified.;Long Term Goal: Stressors or current issues are controlled or eliminated.;Short Term goal:  Identification and review with participant of any Quality of Life or Depression concerns found by scoring the questionnaire.;Long Term goal: The participant improves quality of Life and PHQ9 Scores as seen by post scores and/or verbalization of changes  Quality of Life Scores:   Quality of Life - 05/17/22 1526       Quality of Life   Select Quality of Life      Quality of Life Scores   Health/Function Pre 17.93 %    Socioeconomic Pre 22.75 %    Psych/Spiritual Pre 24.21 %    Family Pre 24.9 %    GLOBAL Pre 21.2 %            Scores of 19 and below usually indicate a poorer quality of life in these areas.  A difference of  2-3 points is a clinically meaningful difference.  A difference of 2-3 points in the total score of the Quality of Life Index has been associated with significant improvement in overall quality of life, self-image, physical symptoms, and general health in studies assessing change in quality of life.  PHQ-9: Review Flowsheet       05/17/2022  Depression screen PHQ 2/9  Decreased Interest 0  Down, Depressed, Hopeless 0  PHQ - 2 Score 0  Altered sleeping 1  Tired, decreased energy 0  Change in appetite 0  Feeling bad or failure about yourself  0  Trouble concentrating 1  Moving slowly or fidgety/restless 0  Suicidal thoughts 0  PHQ-9 Score 2  Difficult doing work/chores Not difficult at all   Interpretation of Total Score  Total Score Depression Severity:  1-4 = Minimal depression, 5-9 = Mild depression, 10-14 = Moderate depression, 15-19 = Moderately severe depression, 20-27 = Severe depression   Psychosocial Evaluation and Intervention:  Psychosocial Evaluation - 05/11/22 1413       Psychosocial Evaluation & Interventions   Interventions Encouraged to exercise with the program and follow exercise prescription    Comments Jorell has no barriers to attending the program. He lives with his wife,[name],of 30 years. She and their children  are his support.  He does take Zoloft for history of anxiety. He would worry about others and get stressed.  He does have some short term memory concerns;diagnosis of mild dementia. He is ready to start the program and build up strength and energy.    Expected Outcomes STG Tripton is able to attend all scheduled sessions. He is able to progress with his exercise  He is able to reduce any anxieties he has. LTG Aniceto continues his exercise progression and keep anxiety reduced    Continue Psychosocial Services  Follow up required by staff             Psychosocial Re-Evaluation:  Psychosocial Re-Evaluation     Row Name 05/31/22 1348             Psychosocial Re-Evaluation   Current issues with None Identified       Comments Ballard states he keeps busy- he has 2 children, 2 greatgranchildren, and a handful of gradnchildren. HE states his sleep is good and does not report any issues with it. His wife is good support and takes care of him with cooking, medications, etc. He is very grateful for her. Denies big stressors. He likes to watch TV to help with stress, though denies currently experiencing any. Still taking Zolofts which works well. Per notes, he experiences some mild dementia. He states he has been enjoying the program so far and is excited to see how his results turn out after finishing. Denies any other concerns at this time.       Expected Outcomes Short: Continue coming to rehab for mood boost Long: Continue  to maintain positive attitude and utilize exercise for stress/anxiety management       Interventions Encouraged to attend Cardiac Rehabilitation for the exercise       Continue Psychosocial Services  Follow up required by staff                Psychosocial Discharge (Final Psychosocial Re-Evaluation):  Psychosocial Re-Evaluation - 05/31/22 1348       Psychosocial Re-Evaluation   Current issues with None Identified    Comments Trestin states he keeps busy- he has 2  children, 2 greatgranchildren, and a handful of gradnchildren. HE states his sleep is good and does not report any issues with it. His wife is good support and takes care of him with cooking, medications, etc. He is very grateful for her. Denies big stressors. He likes to watch TV to help with stress, though denies currently experiencing any. Still taking Zolofts which works well. Per notes, he experiences some mild dementia. He states he has been enjoying the program so far and is excited to see how his results turn out after finishing. Denies any other concerns at this time.    Expected Outcomes Short: Continue coming to rehab for mood boost Long: Continue to maintain positive attitude and utilize exercise for stress/anxiety management    Interventions Encouraged to attend Cardiac Rehabilitation for the exercise    Continue Psychosocial Services  Follow up required by staff             Vocational Rehabilitation: Provide vocational rehab assistance to qualifying candidates.   Vocational Rehab Evaluation & Intervention:  Vocational Rehab - 05/11/22 1406       Initial Vocational Rehab Evaluation & Intervention   Assessment shows need for Vocational Rehabilitation No      Vocational Rehab Re-Evaulation   Comments retired             Education: Education Goals: Education classes will be provided on a variety of topics geared toward better understanding of heart health and risk factor modification. Participant will state understanding/return demonstration of topics presented as noted by education test scores.  Learning Barriers/Preferences:  Learning Barriers/Preferences - 05/11/22 1404       Learning Barriers/Preferences   Learning Barriers Inability to learn new things   memory retention not good   Learning Preferences None             General Cardiac Education Topics:  AED/CPR: - Group verbal and written instruction with the use of models to demonstrate the basic use  of the AED with the basic ABC's of resuscitation.   Anatomy and Cardiac Procedures: - Group verbal and visual presentation and models provide information about basic cardiac anatomy and function. Reviews the testing methods done to diagnose heart disease and the outcomes of the test results. Describes the treatment choices: Medical Management, Angioplasty, or Coronary Bypass Surgery for treating various heart conditions including Myocardial Infarction, Angina, Valve Disease, and Cardiac Arrhythmias.  Written material given at graduation. Flowsheet Row Cardiac Rehab from 06/14/2022 in [facility] Cardiac and Pulmonary Rehab  Education need identified 05/17/22       Medication Safety: - Group verbal and visual instruction to review commonly prescribed medications for heart and lung disease. Reviews the medication, class of the drug, and side effects. Includes the steps to properly store meds and maintain the prescription regimen.  Written material given at graduation. Flowsheet Row Cardiac Rehab from 06/14/2022 in [facility] Cardiac and Pulmonary Rehab  Date 05/24/22  Educator MS  Instruction  Review Code 1- Verbalizes Understanding       Intimacy: - Group verbal instruction through game format to discuss how heart and lung disease can affect sexual intimacy. Written material given at graduation..   Know Your Numbers and Heart Failure: - Group verbal and visual instruction to discuss disease risk factors for cardiac and pulmonary disease and treatment options.  Reviews associated critical values for Overweight/Obesity, Hypertension, Cholesterol, and Diabetes.  Discusses basics of heart failure: signs/symptoms and treatments.  Introduces Heart Failure Zone chart for action plan for heart failure.  Written material given at graduation. Flowsheet Row Cardiac Rehab from 06/14/2022 in [facility] Cardiac and Pulmonary Rehab  Date 05/31/22  Educator [facility]  Instruction Review Code 1- Verbalizes Understanding        Infection Prevention: - Provides verbal and written material to individual with discussion of infection control including proper hand washing and proper equipment cleaning during exercise session. Flowsheet Row Cardiac Rehab from 06/14/2022 in [facility] Cardiac and Pulmonary Rehab  Date 05/17/22  Educator NT  Instruction Review Code 1- Verbalizes Understanding       Falls Prevention: - Provides verbal and written material to individual with discussion of falls prevention and safety. Flowsheet Row Cardiac Rehab from 06/14/2022 in [facility] Cardiac and Pulmonary Rehab  Date 05/11/22  Educator SB  Instruction Review Code 1- Verbalizes Understanding       Other: -Provides group and verbal instruction on various topics (see comments)   Knowledge Questionnaire Score:  Knowledge Questionnaire Score - 05/17/22 1524       Knowledge Questionnaire Score   Pre Score 22/26             Core Components/Risk Factors/Patient Goals at Admission:  Personal Goals and Risk Factors at Admission - 05/17/22 1532       Core Components/Risk Factors/Patient Goals on Admission   Diabetes Yes    Intervention Provide education about signs/symptoms and action to take for hypo/hyperglycemia.;Provide education about proper nutrition, including hydration, and aerobic/resistive exercise prescription along with prescribed medications to achieve blood glucose in normal ranges: Fasting glucose 65-99 mg/dL    Expected Outcomes Short Term: Participant verbalizes understanding of the signs/symptoms and immediate care of hyper/hypoglycemia, proper foot care and importance of medication, aerobic/resistive exercise and nutrition plan for blood glucose control.;Long Term: Attainment of HbA1C < 7%.    Hypertension Yes    Intervention Provide education on lifestyle modifcations including regular physical activity/exercise, weight management, moderate sodium restriction and increased consumption of fresh fruit, vegetables,  and low fat dairy, alcohol moderation, and smoking cessation.;Monitor prescription use compliance.    Expected Outcomes Short Term: Continued assessment and intervention until BP is < 140/20mm HG in hypertensive participants. < 130/11mm HG in hypertensive participants with diabetes, heart failure or chronic kidney disease.;Long Term: Maintenance of blood pressure at goal levels.    Lipids Yes    Intervention Provide education and support for participant on nutrition & aerobic/resistive exercise along with prescribed medications to achieve LDL 70mg , HDL >40mg .    Expected Outcomes Short Term: Participant states understanding of desired cholesterol values and is compliant with medications prescribed. Participant is following exercise prescription and nutrition guidelines.;Long Term: Cholesterol controlled with medications as prescribed, with individualized exercise RX and with personalized nutrition plan. Value goals: LDL < 70mg , HDL > 40 mg.             Education:Diabetes - Individual verbal and written instruction to review signs/symptoms of diabetes, desired ranges of glucose level fasting, after meals and with  exercise. Acknowledge that pre and post exercise glucose checks will be done for 3 sessions at entry of program.   Core Components/Risk Factors/Patient Goals Review:   Goals and Risk Factor Review     Row Name 05/31/22 1344             Core Components/Risk Factors/Patient Goals Review   Personal Goals Review Diabetes;Lipids;Hypertension       Review Jewlian states his wife helps check  his sugar. He is not able to tell me where the numbers have been but states he knows they have been good.They also check his blood pressure which ranges 110-120s/60s. Pressures at rehab are good, though he states a have been couple high coming in but think its from walking in and not resting all the way. He states he has been taking his medications with no complaints.       Expected Outcomes Short:  Continue to monitor sugars and BP closely, become aware of any abnormal values Long: Continue to manage lifestyle risk factors                Core Components/Risk Factors/Patient Goals at Discharge (Final Review):   Goals and Risk Factor Review - 05/31/22 1344       Core Components/Risk Factors/Patient Goals Review   Personal Goals Review Diabetes;Lipids;Hypertension    Review Deyshawn states his wife helps check  his sugar. He is not able to tell me where the numbers have been but states he knows they have been good.They also check his blood pressure which ranges 110-120s/60s. Pressures at rehab are good, though he states a have been couple high coming in but think its from walking in and not resting all the way. He states he has been taking his medications with no complaints.    Expected Outcomes Short: Continue to monitor sugars and BP closely, become aware of any abnormal values Long: Continue to manage lifestyle risk factors             ITP Comments:  ITP Comments     Row Name 05/11/22 1412 05/17/22 1523 05/22/22 1336 05/24/22 1524 06/21/22 0941   ITP Comments Virtual orientation call completed today. he has an appointment on Date: [id number]  for EP eval and gym Orientation.  Documentation of diagnosis can be found in [facility] Date: 04/12/2022 . Completed and gym orientation. Initial ITP created and sent for review to Dr. Bethann Punches, Medical Director. First full day of exercise!  Patient was oriented to gym and equipment including functions, settings, policies, and procedures.  Patient's individual exercise prescription and treatment plan were reviewed.  All starting workloads were established based on the results of the 6 minute walk test done at initial orientation visit.  The plan for exercise progression was also introduced and progression will be customized based on patient's performance and goals. 30 day review completed. ITP sent to Dr. Bethann Punches, Medical Director of Cardiac  Rehab. Continue with ITP unless changes are made by physician.   Pt is new to program. 30 Day review completed. Medical Director ITP review done, changes made as directed, and signed approval by Medical Director.    Row Name 06/21/22 1347 07/10/22 1408 07/19/22 1435 08/07/22 1526 08/16/22 1209   ITP Comments Sharrod is currently admitted with 4 broken ribs and some possible internal bleeding.  PT is thinking he will need to go to SNF at discharge.  We have placed him on medical hold for at least the next month. Mrs. Diosdado called  and left a message to for one of [place] to call back for an update on [place]. He is doing better and eager to get home.  He is working with physical therapy at [facility] and starting to do some self care duties.  We talked about keeping [place] updated on his progress and to let us know when he is released and plans to return to rehab. 30 day review completed. ITP sent to Dr. Bethann Punches, Medical Director of Cardiac Rehab. Continue with ITP unless changes are made by physician.  Nerick continues to be out on medical leave. Spoke with patient and wife- patient d/c from SNF and now currently receiving home PT and OT. Once patient is completed with home health, will need clearance to come back to cardiac rehab. Stated he has been using a walker and if he continues to use it, will need to bring that with him to rehab going forward. They will keep [place] updated on what is going on. Continue medical hold. 30 Day review completed. Medical Director ITP review done, changes made as directed, and signed approval by Medical Director.    remains out for medical reasons            Comments:

## 2022-08-18 ENCOUNTER — Other Ambulatory Visit: Payer: Self-pay

## 2022-08-18 ENCOUNTER — Emergency Department (HOSPITAL_COMMUNITY): Payer: PPO

## 2022-08-18 ENCOUNTER — Encounter (HOSPITAL_COMMUNITY): Payer: Self-pay

## 2022-08-18 ENCOUNTER — Inpatient Hospital Stay (HOSPITAL_COMMUNITY)
Admission: EM | Admit: 2022-08-18 | Discharge: 2022-09-22 | DRG: 085 | Disposition: A | Discharge status: Hospice | Payer: PPO | Attending: Family Medicine | Admitting: Family Medicine

## 2022-08-18 DIAGNOSIS — R296 Repeated falls: Secondary | ICD-10-CM | POA: Diagnosis present

## 2022-08-18 DIAGNOSIS — S0181XA Laceration without foreign body of other part of head, initial encounter: Secondary | ICD-10-CM | POA: Diagnosis present

## 2022-08-18 DIAGNOSIS — M25552 Pain in left hip: Secondary | ICD-10-CM | POA: Diagnosis not present

## 2022-08-18 DIAGNOSIS — R1310 Dysphagia, unspecified: Secondary | ICD-10-CM | POA: Diagnosis present

## 2022-08-18 DIAGNOSIS — R627 Adult failure to thrive: Secondary | ICD-10-CM | POA: Diagnosis present

## 2022-08-18 DIAGNOSIS — Z955 Presence of coronary angioplasty implant and graft: Secondary | ICD-10-CM

## 2022-08-18 DIAGNOSIS — E876 Hypokalemia: Secondary | ICD-10-CM | POA: Diagnosis not present

## 2022-08-18 DIAGNOSIS — I6389 Other cerebral infarction: Secondary | ICD-10-CM | POA: Diagnosis not present

## 2022-08-18 DIAGNOSIS — Z8673 Personal history of transient ischemic attack (TIA), and cerebral infarction without residual deficits: Secondary | ICD-10-CM

## 2022-08-18 DIAGNOSIS — I639 Cerebral infarction, unspecified: Secondary | ICD-10-CM

## 2022-08-18 DIAGNOSIS — N179 Acute kidney failure, unspecified: Secondary | ICD-10-CM | POA: Diagnosis not present

## 2022-08-18 DIAGNOSIS — R29810 Facial weakness: Secondary | ICD-10-CM | POA: Diagnosis present

## 2022-08-18 DIAGNOSIS — Z7982 Long term (current) use of aspirin: Secondary | ICD-10-CM

## 2022-08-18 DIAGNOSIS — E1151 Type 2 diabetes mellitus with diabetic peripheral angiopathy without gangrene: Secondary | ICD-10-CM | POA: Diagnosis present

## 2022-08-18 DIAGNOSIS — R9431 Abnormal electrocardiogram [ECG] [EKG]: Secondary | ICD-10-CM | POA: Diagnosis not present

## 2022-08-18 DIAGNOSIS — R4182 Altered mental status, unspecified: Secondary | ICD-10-CM | POA: Diagnosis not present

## 2022-08-18 DIAGNOSIS — Y92008 Other place in unspecified non-institutional (private) residence as the place of occurrence of the external cause: Secondary | ICD-10-CM | POA: Diagnosis not present

## 2022-08-18 DIAGNOSIS — G9340 Encephalopathy, unspecified: Secondary | ICD-10-CM

## 2022-08-18 DIAGNOSIS — R569 Unspecified convulsions: Secondary | ICD-10-CM | POA: Diagnosis not present

## 2022-08-18 DIAGNOSIS — I252 Old myocardial infarction: Secondary | ICD-10-CM

## 2022-08-18 DIAGNOSIS — Z981 Arthrodesis status: Secondary | ICD-10-CM

## 2022-08-18 DIAGNOSIS — Z9861 Coronary angioplasty status: Secondary | ICD-10-CM | POA: Diagnosis not present

## 2022-08-18 DIAGNOSIS — R9389 Abnormal findings on diagnostic imaging of other specified body structures: Secondary | ICD-10-CM | POA: Diagnosis not present

## 2022-08-18 DIAGNOSIS — I779 Disorder of arteries and arterioles, unspecified: Secondary | ICD-10-CM | POA: Diagnosis present

## 2022-08-18 DIAGNOSIS — E1165 Type 2 diabetes mellitus with hyperglycemia: Secondary | ICD-10-CM | POA: Diagnosis not present

## 2022-08-18 DIAGNOSIS — Z7189 Other specified counseling: Secondary | ICD-10-CM | POA: Diagnosis not present

## 2022-08-18 DIAGNOSIS — E43 Unspecified severe protein-calorie malnutrition: Secondary | ICD-10-CM | POA: Diagnosis not present

## 2022-08-18 DIAGNOSIS — I10 Essential (primary) hypertension: Secondary | ICD-10-CM | POA: Diagnosis present

## 2022-08-18 DIAGNOSIS — R64 Cachexia: Secondary | ICD-10-CM | POA: Diagnosis present

## 2022-08-18 DIAGNOSIS — D62 Acute posthemorrhagic anemia: Secondary | ICD-10-CM | POA: Diagnosis not present

## 2022-08-18 DIAGNOSIS — F39 Unspecified mood [affective] disorder: Secondary | ICD-10-CM | POA: Diagnosis present

## 2022-08-18 DIAGNOSIS — M1990 Unspecified osteoarthritis, unspecified site: Secondary | ICD-10-CM | POA: Diagnosis present

## 2022-08-18 DIAGNOSIS — R561 Post traumatic seizures: Secondary | ICD-10-CM | POA: Diagnosis present

## 2022-08-18 DIAGNOSIS — R531 Weakness: Secondary | ICD-10-CM | POA: Diagnosis not present

## 2022-08-18 DIAGNOSIS — I2510 Atherosclerotic heart disease of native coronary artery without angina pectoris: Secondary | ICD-10-CM | POA: Diagnosis not present

## 2022-08-18 DIAGNOSIS — W108XXA Fall (on) (from) other stairs and steps, initial encounter: Secondary | ICD-10-CM | POA: Diagnosis present

## 2022-08-18 DIAGNOSIS — Z8701 Personal history of pneumonia (recurrent): Secondary | ICD-10-CM

## 2022-08-18 DIAGNOSIS — S065XAA Traumatic subdural hemorrhage with loss of consciousness status unknown, initial encounter: Secondary | ICD-10-CM | POA: Diagnosis not present

## 2022-08-18 DIAGNOSIS — Z7902 Long term (current) use of antithrombotics/antiplatelets: Secondary | ICD-10-CM

## 2022-08-18 DIAGNOSIS — J189 Pneumonia, unspecified organism: Secondary | ICD-10-CM | POA: Diagnosis not present

## 2022-08-18 DIAGNOSIS — G8194 Hemiplegia, unspecified affecting left nondominant side: Secondary | ICD-10-CM | POA: Diagnosis present

## 2022-08-18 DIAGNOSIS — S0181XD Laceration without foreign body of other part of head, subsequent encounter: Secondary | ICD-10-CM | POA: Diagnosis not present

## 2022-08-18 DIAGNOSIS — Z7984 Long term (current) use of oral hypoglycemic drugs: Secondary | ICD-10-CM

## 2022-08-18 DIAGNOSIS — F05 Delirium due to known physiological condition: Secondary | ICD-10-CM | POA: Diagnosis present

## 2022-08-18 DIAGNOSIS — G40901 Epilepsy, unspecified, not intractable, with status epilepticus: Secondary | ICD-10-CM | POA: Diagnosis not present

## 2022-08-18 DIAGNOSIS — Z66 Do not resuscitate: Secondary | ICD-10-CM | POA: Diagnosis present

## 2022-08-18 DIAGNOSIS — Z8616 Personal history of COVID-19: Secondary | ICD-10-CM

## 2022-08-18 DIAGNOSIS — S065X0A Traumatic subdural hemorrhage without loss of consciousness, initial encounter: Principal | ICD-10-CM | POA: Diagnosis present

## 2022-08-18 DIAGNOSIS — I619 Nontraumatic intracerebral hemorrhage, unspecified: Secondary | ICD-10-CM | POA: Diagnosis not present

## 2022-08-18 DIAGNOSIS — R54 Age-related physical debility: Secondary | ICD-10-CM | POA: Diagnosis present

## 2022-08-18 DIAGNOSIS — E871 Hypo-osmolality and hyponatremia: Secondary | ICD-10-CM | POA: Diagnosis not present

## 2022-08-18 DIAGNOSIS — Z515 Encounter for palliative care: Secondary | ICD-10-CM | POA: Diagnosis not present

## 2022-08-18 DIAGNOSIS — S066X0A Traumatic subarachnoid hemorrhage without loss of consciousness, initial encounter: Secondary | ICD-10-CM | POA: Diagnosis present

## 2022-08-18 DIAGNOSIS — M549 Dorsalgia, unspecified: Secondary | ICD-10-CM | POA: Diagnosis not present

## 2022-08-18 DIAGNOSIS — Z87891 Personal history of nicotine dependence: Secondary | ICD-10-CM

## 2022-08-18 DIAGNOSIS — S065X0D Traumatic subdural hemorrhage without loss of consciousness, subsequent encounter: Secondary | ICD-10-CM | POA: Diagnosis not present

## 2022-08-18 DIAGNOSIS — Z79899 Other long term (current) drug therapy: Secondary | ICD-10-CM

## 2022-08-18 DIAGNOSIS — Z888 Allergy status to other drugs, medicaments and biological substances status: Secondary | ICD-10-CM

## 2022-08-18 DIAGNOSIS — E785 Hyperlipidemia, unspecified: Secondary | ICD-10-CM | POA: Diagnosis present

## 2022-08-18 DIAGNOSIS — Z6822 Body mass index (BMI) 22.0-22.9, adult: Secondary | ICD-10-CM

## 2022-08-18 DIAGNOSIS — S40022A Contusion of left upper arm, initial encounter: Secondary | ICD-10-CM | POA: Diagnosis present

## 2022-08-18 LAB — CBC WITH DIFFERENTIAL/PLATELET
Abs Immature Granulocytes: 0.02 10*3/uL (ref 0.00–0.07)
Basophils Absolute: 0 10*3/uL (ref 0.0–0.1)
Basophils Relative: 1 %
Eosinophils Absolute: 0.3 10*3/uL (ref 0.0–0.5)
Eosinophils Relative: 4 %
HCT: 31.3 % — ABNORMAL LOW (ref 39.0–52.0)
Hemoglobin: 9.9 g/dL — ABNORMAL LOW (ref 13.0–17.0)
Immature Granulocytes: 0 %
Lymphocytes Relative: 20 %
Lymphs Abs: 1.4 10*3/uL (ref 0.7–4.0)
MCH: 30 pg (ref 26.0–34.0)
MCHC: 31.6 g/dL (ref 30.0–36.0)
MCV: 94.8 fL (ref 80.0–100.0)
Monocytes Absolute: 0.7 10*3/uL (ref 0.1–1.0)
Monocytes Relative: 9 %
Neutro Abs: 4.9 10*3/uL (ref 1.7–7.7)
Neutrophils Relative %: 66 %
Platelets: 297 10*3/uL (ref 150–400)
RBC: 3.3 MIL/uL — ABNORMAL LOW (ref 4.22–5.81)
RDW: 15.1 % (ref 11.5–15.5)
WBC: 7.3 10*3/uL (ref 4.0–10.5)
nRBC: 0 % (ref 0.0–0.2)

## 2022-08-18 LAB — BASIC METABOLIC PANEL
Anion gap: 13 (ref 5–15)
BUN: 17 mg/dL (ref 8–23)
CO2: 25 mmol/L (ref 22–32)
Calcium: 8.6 mg/dL — ABNORMAL LOW (ref 8.9–10.3)
Chloride: 99 mmol/L (ref 98–111)
Creatinine, Ser: 1.03 mg/dL (ref 0.61–1.24)
GFR, Estimated: >60 mL/min (ref >60)
Glucose, Bld: 148 mg/dL — ABNORMAL HIGH (ref 70–99)
Potassium: 3.9 mmol/L (ref 3.5–5.1)
Sodium: 137 mmol/L (ref 135–145)

## 2022-08-18 LAB — PROTIME-INR
INR: 1.1 (ref 0.8–1.2)
Prothrombin Time: 14.8 seconds (ref 11.4–15.2)

## 2022-08-18 LAB — TROPONIN I (HIGH SENSITIVITY)
Troponin I (High Sensitivity): 20 ng/L — ABNORMAL HIGH (ref <18)
Troponin I (High Sensitivity): 24 ng/L — ABNORMAL HIGH (ref <18)

## 2022-08-18 MED ORDER — LABETALOL HCL 5 MG/ML IV SOLN
10.0000 mg | INTRAVENOUS | Status: DC | PRN
  Administered 2022-08-20 – 2022-08-21 (×5): 20 mg via INTRAVENOUS
  Administered 2022-08-21 (×3): 40 mg via INTRAVENOUS
  Filled 2022-08-18: qty 8
  Filled 2022-08-18: qty 4
  Filled 2022-08-18: qty 8
  Filled 2022-08-18: qty 4
  Filled 2022-08-18: qty 8
  Filled 2022-08-18: qty 4
  Filled 2022-08-18: qty 8

## 2022-08-18 MED ORDER — ACETAMINOPHEN 325 MG PO TABS
650.0000 mg | ORAL_TABLET | ORAL | Status: DC | PRN
  Administered 2022-08-20: 650 mg via ORAL
  Filled 2022-08-18: qty 2

## 2022-08-18 MED ORDER — DOCUSATE SODIUM 100 MG PO CAPS
100.0000 mg | ORAL_CAPSULE | Freq: Two times a day (BID) | ORAL | Status: DC
  Administered 2022-08-18 – 2022-08-20 (×4): 100 mg via ORAL
  Filled 2022-08-18 (×4): qty 1

## 2022-08-18 MED ORDER — ORAL CARE MOUTH RINSE: 15.0000 mL | OROMUCOSAL | Status: DC | PRN

## 2022-08-18 MED ORDER — ACETAMINOPHEN 650 MG RE SUPP: 650.0000 mg | RECTAL | Status: DC | PRN

## 2022-08-18 MED ORDER — LIDOCAINE-EPINEPHRINE 1 %-1:100000 IJ SOLN
10.0000 mL | Freq: Once | INTRAMUSCULAR | Status: AC
  Administered 2022-08-18: 10 mL
  Filled 2022-08-18: qty 1

## 2022-08-18 MED ORDER — FAMOTIDINE IN NACL 20-0.9 MG/50ML-% IV SOLN
20.0000 mg | Freq: Two times a day (BID) | INTRAVENOUS | Status: DC
  Administered 2022-08-18 – 2022-08-20 (×4): 20 mg via INTRAVENOUS
  Filled 2022-08-18 (×4): qty 50

## 2022-08-18 MED ORDER — ONDANSETRON HCL 4 MG/2ML IJ SOLN
4.0000 mg | INTRAMUSCULAR | Status: DC | PRN
  Administered 2022-09-20: 4 mg via INTRAVENOUS
  Filled 2022-08-18 (×2): qty 2

## 2022-08-18 MED ORDER — PROMETHAZINE HCL 25 MG PO TABS: 12.5000 mg | ORAL_TABLET | ORAL | Status: DC | PRN

## 2022-08-18 MED ORDER — HYDROCODONE-ACETAMINOPHEN 5-325 MG PO TABS
1.0000 | ORAL_TABLET | ORAL | Status: DC | PRN
  Administered 2022-08-19: 1 via ORAL
  Filled 2022-08-18: qty 1

## 2022-08-18 MED ORDER — HYDROMORPHONE HCL 1 MG/ML IJ SOLN: 0.5000 mg | INTRAMUSCULAR | Status: DC | PRN

## 2022-08-18 MED ORDER — POLYETHYLENE GLYCOL 3350 17 G PO PACK: 17.0000 g | PACK | Freq: Every day | ORAL | Status: DC | PRN

## 2022-08-18 MED ORDER — ONDANSETRON HCL 4 MG PO TABS: 4.0000 mg | ORAL_TABLET | ORAL | Status: DC | PRN

## 2022-08-18 MED ORDER — ONDANSETRON HCL 4 MG/2ML IJ SOLN
4.0000 mg | Freq: Once | INTRAMUSCULAR | Status: AC
  Administered 2022-08-18: 4 mg via INTRAVENOUS
  Filled 2022-08-18: qty 2

## 2022-08-18 NOTE — ED Provider Notes (Signed)
..  Laceration Repair  Date/Time: 08/18/2022 7:43 PM  Performed by: Fulton Reek, MD Authorized by: Eber Hong, MD   Consent:    Consent obtained:  Verbal   Consent given by:  Patient   Risks, benefits, and alternatives were discussed: yes     Risks discussed:  Infection, pain, need for additional repair, poor wound healing, poor cosmetic result and vascular damage   Alternatives discussed:  No treatment Universal protocol:    Procedure explained and questions answered to patient or proxy's satisfaction: yes     Patient identity confirmed:  Verbally with patient and arm band Anesthesia:    Anesthesia method:  Local infiltration   Local anesthetic:  Lidocaine 1% WITH epi Laceration details:    Location:  Face   Face location:  Forehead   Length (cm):  2 Pre-procedure details:    Preparation:  Patient was prepped and draped in usual sterile fashion Exploration:    Limited defect created (wound extended): no     Hemostasis achieved with:  Direct pressure and epinephrine   Wound exploration: entire depth of wound visualized     Contaminated: no   Treatment:    Area cleansed with:  Chlorhexidine and saline   Amount of cleaning:  Standard   Irrigation solution:  Sterile saline   Irrigation method:  Syringe Skin repair:    Repair method:  Sutures   Suture size:  5-0   Suture material:  Prolene   Suture technique:  Simple interrupted   Number of sutures:  4 Approximation:    Approximation:  Close Repair type:    Repair type:  Simple Post-procedure details:    Dressing:  Sterile dressing   Procedure completion:  Sabino Donovan, MD 08/18/22 Rushie Goltz    Eber Hong, MD 08/18/22 2214

## 2022-08-18 NOTE — ED Triage Notes (Signed)
Pt BIB [facility] as Level 2- Fall on thinners. From home & was walking out the back door & stumbled down one step & hit his head on the wooden porch. Is on Plavix & has an approx. 1in. lac above Lt eye brow & bruises to Lt FA. Denies LOC, A/Ox4, DNT & MOST form at bedside. 190/90, 76 bpm, 18 resp, 95% on RA, 157 CBG.

## 2022-08-18 NOTE — ED Notes (Signed)
Pt forehead lac has been stitched and wrapped

## 2022-08-18 NOTE — Progress Notes (Signed)
I was rounding in the emergency room, happened to be walking and while EKG was being performed, EKG reveals ST elevation myocardial infarction, probably acute Lp(a) in the anteroseptal leads along with ST depression in 1 and aVL.  Patient completely asymptomatic with no chest pain or dyspnea.  Came in with a fall which is accidental fall and had a scalp injury.  While being evaluated, no clinical evidence of heart failure, no chest pain, normal vascular exam, I also received report that CT scan reveals large subdural hematoma.  At this point no indication for calling for STEMI.  Suspect his EKG abnormalities probably related to intracranial bleed.  Patient asymptomatic and hemodynamically stable.  Patient has had complex staged coronary intervention to all 3 vessels including LAD, CX, RCA in January 2024, is presently on dual antiplatelet therapy.  If he does need evacuation of subdural hematoma, would recommend proceeding with the same, fortunately his angioplasty was almost 6 months ago, hence DAPT can be held.  I discussed the findings of the EKG with Dr. Bryan Lemma who is in agreement.   Yates Decamp, MD, [facility] 08/18/2022, 5:21 PM Office: [phone] Fax: [phone] Pager: [phone]

## 2022-08-18 NOTE — ED Notes (Signed)
Transported to CT 

## 2022-08-18 NOTE — ED Notes (Signed)
ED TO INPATIENT HANDOFF REPORT  ED Nurse Name and Phone #:  Les Pou RN [phone]  S Name/Age/Gender Raymond Sampson 82 y.o. male Room/Bed: TRABC/TRABC  Code Status   Code Status: Full Code  Home/SNF/Other Home Patient oriented to: self, place, time, and situation Is this baseline? Yes   Triage Complete: Triage complete  Chief Complaint Subdural hematoma (HCC) [S06.5XAA]  Triage Note Pt BIB [facility] as Level 2- Fall on thinners. From home & was walking out the back door & stumbled down one step & hit his head on the wooden porch. Is on Plavix & has an approx. 1in. lac above Lt eye brow & bruises to Lt FA. Denies LOC, A/Ox4, DNT & MOST form at bedside. 190/90, 76 bpm, 18 resp, 95% on RA, 157 CBG.     Allergies Allergies  Allergen Reactions   Metformin And Related Itching    Erectile dysfunction   Simvastatin Rash    rash    Level of Care/Admitting Diagnosis ED Disposition     ED Disposition  Admit   Condition  --   Comment  Hospital Area: [facility] [[id number]]  Level of Care: ICU [6]  May admit patient to [facility] or [facility] if equivalent level of care is available:: No  Covid Evaluation: Asymptomatic - no recent exposure (last 10 days) testing not required  Diagnosis: Subdural hematoma [facility]) [[id number]]  Admitting Physician: Jadene Pierini [[id number]]  Attending Physician: Jadene Pierini [phone]  Bed request comments: 4N  Certification:: I certify this patient will need inpatient services for at least 2 midnights  Estimated Length of Stay: 3          B Medical/Surgery History Past Medical History:  Diagnosis Date   Arthritic-like pain    CAD (coronary artery disease)    a. apical MI 5/07 with LHC showing 50% pCFX, 30% pRCA, 99% pLAD, 99% mLAD, 75% dLAD (cypher DES x3 to LAD); b. ETT-myoview (12/08) 81% MPHR,  EF 64%, partially reversible inferoapical perfusion defect similar to prior study 12/07; c. 04/2016 MV: EF 59%,  fixed anteroapical defect, no ischemia->Low risk.   Carotid arterial disease (HCC)    a. 05/2016 Carotid U/S: 30-40% bilat dzs, f/u in 2 yrs.   Chest pain 04/13/2022   CVA (cerebral vascular accident) [facility])    a. 07/2010 - h/o CVA/TIA.   Diet-controlled type 2 diabetes mellitus (HCC)    HLD (hyperlipidemia)    HTN (hypertension)    PAT (paroxysmal atrial tachycardia)    a. 06/2016 Event monitor:  rare episodes of atrial tachycardia, longest 7 beats. No afib.   Past Surgical History:  Procedure Laterality Date   BACK SURGERY     CARDIAC CATHETERIZATION     CORONARY ANGIOPLASTY WITH STENT PLACEMENT     apical MI 5/07 with LHC showing 50% pCFX, 30% pRCA, 99% pLAD, 9%% mLAD, 75% dLAD. cypher DES x3 to LAD. ETT-myoview (12/08) 81% MPHR, 8'1, EF 64%, partially reversible inferoapical perfusion defect similar to prior study 12/07   CORONARY IMAGING/OCT N/A 04/14/2022   Procedure: INTRAVASCULAR IMAGING/OCT;  Surgeon: Yvonne Kendall, MD;  Location: [facility] INVASIVE CV LAB;  Service: Cardiovascular;  Laterality: N/A;   CORONARY STENT INTERVENTION N/A 04/14/2022   Procedure: CORONARY STENT INTERVENTION;  Surgeon: Yvonne Kendall, MD;  Location: [facility] INVASIVE CV LAB;  Service: Cardiovascular;  Laterality: N/A;   CORONARY STENT INTERVENTION Left 05/02/2022   Procedure: CORONARY STENT INTERVENTION;  Surgeon: Yvonne Kendall, MD;  Location: [facility] INVASIVE  CV LAB;  Service: Cardiovascular;  Laterality: Left;   LEFT HEART CATH AND CORONARY ANGIOGRAPHY N/A 04/13/2022   Procedure: LEFT HEART CATH AND CORONARY ANGIOGRAPHY;  Surgeon: Marykay Lex, MD;  Location: [facility] INVASIVE CV LAB;  Service: Cardiovascular;  Laterality: N/A;     A IV Location/Drains/Wounds Patient Lines/Drains/Airways Status     Active Line/Drains/Airways     Name Placement date Placement time Site Days   Peripheral IV 08/18/22 18 G Left;Upper Arm 08/18/22  1925  Arm  less than 1            Intake/Output Last 24 hours No intake  or output data in the 24 hours ending 08/18/22 2106  Labs/Imaging Results for orders placed or performed during the hospital encounter of 08/18/22 (from the past 48 hour(s))  CBC with Differential     Status: Abnormal   Collection Time: 08/18/22  4:12 PM  Result Value Ref Range   WBC 7.3 4.0 - 10.5 K/uL   RBC 3.30 (L) 4.22 - 5.81 MIL/uL   Hemoglobin 9.9 (L) 13.0 - 17.0 g/dL   HCT 16.1 (L) 09.6 - 04.5 %   MCV 94.8 80.0 - 100.0 fL   MCH 30.0 26.0 - 34.0 pg   MCHC 31.6 30.0 - 36.0 g/dL   RDW 40.9 81.1 - 91.4 %   Platelets 297 150 - 400 K/uL   nRBC 0.0 0.0 - 0.2 %   Neutrophils Relative % 66 %   Neutro Abs 4.9 1.7 - 7.7 K/uL   Lymphocytes Relative 20 %   Lymphs Abs 1.4 0.7 - 4.0 K/uL   Monocytes Relative 9 %   Monocytes Absolute 0.7 0.1 - 1.0 K/uL   Eosinophils Relative 4 %   Eosinophils Absolute 0.3 0.0 - 0.5 K/uL   Basophils Relative 1 %   Basophils Absolute 0.0 0.0 - 0.1 K/uL   Immature Granulocytes 0 %   Abs Immature Granulocytes 0.02 0.00 - 0.07 K/uL    Comment: Performed at [facility], [address], [place], [place] [address]  Basic metabolic panel     Status: Abnormal   Collection Time: 08/18/22  4:12 PM  Result Value Ref Range   Sodium 137 135 - 145 mmol/L   Potassium 3.9 3.5 - 5.1 mmol/L   Chloride 99 98 - 111 mmol/L   CO2 25 22 - 32 mmol/L   Glucose, Bld 148 (H) 70 - 99 mg/dL    Comment: Glucose reference range applies only to samples taken after fasting for at least 8 hours.   BUN 17 8 - 23 mg/dL   Creatinine, Ser 6.21 0.61 - 1.24 mg/dL   Calcium 8.6 (L) 8.9 - 10.3 mg/dL   GFR, Estimated >30 >86 mL/min    Comment: (NOTE) Calculated using the CKD-EPI Creatinine Equation (2021)    Anion gap 13 5 - 15    Comment: Performed at [facility], [address] [place], [place] [address]  Protime-INR     Status: None   Collection Time: 08/18/22  4:12 PM  Result Value Ref Range   Prothrombin Time 14.8 11.4 - 15.2 seconds   INR 1.1 0.8 - 1.2     Comment: (NOTE) INR goal varies based on device and disease states. Performed at [facility], [address] [place], [place] [address]   Troponin I (High Sensitivity)     Status: Abnormal   Collection Time: 08/18/22  4:12 PM  Result Value Ref Range   Troponin  I (High Sensitivity) 20 (H) <18 ng/L    Comment: (NOTE) Elevated high sensitivity troponin I (hsTnI) values and significant  changes across serial measurements may suggest ACS but many other  chronic and acute conditions are known to elevate hsTnI results.  Refer to the "Links" section for chest pain algorithms and additional  guidance. Performed at [facility], [address] [place], [place] [address]   Troponin I (High Sensitivity)     Status: Abnormal   Collection Time: 08/18/22  7:30 PM  Result Value Ref Range   Troponin I (High Sensitivity) 24 (H) <18 ng/L    Comment: (NOTE) Elevated high sensitivity troponin I (hsTnI) values and significant  changes across serial measurements may suggest ACS but many other  chronic and acute conditions are known to elevate hsTnI results.  Refer to the "Links" section for chest pain algorithms and additional  guidance. Performed at [facility], [address], [place], [place] [address]    CT Head Wo Contrast  Result Date: 08/18/2022 CLINICAL DATA:  Head trauma fall blood thinners EXAM: CT HEAD WITHOUT CONTRAST CT MAXILLOFACIAL WITHOUT CONTRAST CT CERVICAL SPINE WITHOUT CONTRAST TECHNIQUE: Multidetector CT imaging of the head, cervical spine, and maxillofacial structures were performed using the standard protocol without intravenous contrast. Multiplanar CT image reconstructions of the cervical spine and maxillofacial structures were also generated. RADIATION DOSE REDUCTION: This exam was performed according to the departmental dose-optimization program which includes automated exposure control, adjustment of the mA and/or kV according to patient size and/or use of  iterative reconstruction technique. COMPARISON:  CT brain 06/21/2022 FINDINGS: CT HEAD FINDINGS Brain: Mixed density subdural hematoma along the right convexity with acute hemorrhage present, this measures up to 2.2 cm in maximum thickness on coronal series 5, image 25. Approximately 5 mm midline shift to the left. Mass effect on underlying brain parenchyma. Trace subarachnoid blood at the right temporal lobe. Encephalomalacia at the left parietooccipital region. Mild chronic small vessel ischemic changes of the white matter. Stable retro cerebellar CSF density. Vascular: No hyperdense vessels.  Carotid vascular calcification. Skull: No depressed skull fracture. Other: Left forehead scalp hematoma. CT MAXILLOFACIAL FINDINGS Osseous: Mastoid air cells are clear. Mandibular heads are normally position. No mandibular fracture. Pterygoid plates and zygomatic arches appear intact. No acute nasal bone fracture Orbits: Negative. No traumatic or inflammatory finding. Sinuses: Debris in the maxillary sinuses. Mucous retention cysts. No definite sinus wall fracture Soft tissues: Left periorbital hematoma and laceration CT CERVICAL SPINE FINDINGS Alignment: Stable alignment.  Facet alignment within normal limits. Skull base and vertebrae: Craniovertebral junction appears grossly intact. Vertebral body heights are maintained. Lucency at the left superior facet of C3, sagittal series 8, image 67 through 69. This extends to the left C2-C3 facet joint. Margins appear somewhat sclerotic. Soft tissues and spinal canal: No prevertebral fluid or swelling. No visible canal hematoma. Disc levels: Anterior fusion C3 through C6. Moderate degenerative changes at C6-C7. Facet degenerative changes at multiple levels with foraminal stenosis. Upper chest: Emphysema. Right pleural effusion. Hazy upper lobe airspace disease is indeterminate for edema or pneumonia Other: None IMPRESSION: 1. Large mixed density subdural hematoma with acute blood  along the right convexity measuring up to 2.2 cm in maximum thickness with mass effect on underlying brain parenchyma and right lateral ventricle; 5-6 mm midline shift to the left. Trace subarachnoid blood at the right temporal lobe. 2. Atrophy and chronic small vessel ischemic changes of the white matter 3. No acute facial bone  fracture. 4. Anterior fusion C3 through C6. Lucency at the left superior facet of C3 extending to the left C2-C3 facet joint. Margins appear somewhat sclerotic and this may be secondary to subacute to chronic fracture but is new since 2019, correlate for focal pain. Otherwise no acute osseous abnormality. 5. Right pleural effusion. Hazy upper lobe airspace disease, indeterminate for edema or pneumonia. Emphysema. Emphysema (ICD10-J43.9). Critical Value/emergent results were called by telephone at the time of interpretation on 08/18/2022 at 4:56 pm to provider [facility] , who verbally acknowledged these results. Electronically Signed   By: Jasmine Pang M.D.   On: 08/18/2022 17:10   CT Cervical Spine Wo Contrast  Result Date: 08/18/2022 CLINICAL DATA:  Head trauma fall blood thinners EXAM: CT HEAD WITHOUT CONTRAST CT MAXILLOFACIAL WITHOUT CONTRAST CT CERVICAL SPINE WITHOUT CONTRAST TECHNIQUE: Multidetector CT imaging of the head, cervical spine, and maxillofacial structures were performed using the standard protocol without intravenous contrast. Multiplanar CT image reconstructions of the cervical spine and maxillofacial structures were also generated. RADIATION DOSE REDUCTION: This exam was performed according to the departmental dose-optimization program which includes automated exposure control, adjustment of the mA and/or kV according to patient size and/or use of iterative reconstruction technique. COMPARISON:  CT brain 06/21/2022 FINDINGS: CT HEAD FINDINGS Brain: Mixed density subdural hematoma along the right convexity with acute hemorrhage present, this measures up to 2.2 cm in  maximum thickness on coronal series 5, image 25. Approximately 5 mm midline shift to the left. Mass effect on underlying brain parenchyma. Trace subarachnoid blood at the right temporal lobe. Encephalomalacia at the left parietooccipital region. Mild chronic small vessel ischemic changes of the white matter. Stable retro cerebellar CSF density. Vascular: No hyperdense vessels.  Carotid vascular calcification. Skull: No depressed skull fracture. Other: Left forehead scalp hematoma. CT MAXILLOFACIAL FINDINGS Osseous: Mastoid air cells are clear. Mandibular heads are normally position. No mandibular fracture. Pterygoid plates and zygomatic arches appear intact. No acute nasal bone fracture Orbits: Negative. No traumatic or inflammatory finding. Sinuses: Debris in the maxillary sinuses. Mucous retention cysts. No definite sinus wall fracture Soft tissues: Left periorbital hematoma and laceration CT CERVICAL SPINE FINDINGS Alignment: Stable alignment.  Facet alignment within normal limits. Skull base and vertebrae: Craniovertebral junction appears grossly intact. Vertebral body heights are maintained. Lucency at the left superior facet of C3, sagittal series 8, image 67 through 69. This extends to the left C2-C3 facet joint. Margins appear somewhat sclerotic. Soft tissues and spinal canal: No prevertebral fluid or swelling. No visible canal hematoma. Disc levels: Anterior fusion C3 through C6. Moderate degenerative changes at C6-C7. Facet degenerative changes at multiple levels with foraminal stenosis. Upper chest: Emphysema. Right pleural effusion. Hazy upper lobe airspace disease is indeterminate for edema or pneumonia Other: None IMPRESSION: 1. Large mixed density subdural hematoma with acute blood along the right convexity measuring up to 2.2 cm in maximum thickness with mass effect on underlying brain parenchyma and right lateral ventricle; 5-6 mm midline shift to the left. Trace subarachnoid blood at the right  temporal lobe. 2. Atrophy and chronic small vessel ischemic changes of the white matter 3. No acute facial bone fracture. 4. Anterior fusion C3 through C6. Lucency at the left superior facet of C3 extending to the left C2-C3 facet joint. Margins appear somewhat sclerotic and this may be secondary to subacute to chronic fracture but is new since 2019, correlate for focal pain. Otherwise no acute osseous abnormality. 5. Right pleural effusion. Hazy upper lobe airspace disease, indeterminate  for edema or pneumonia. Emphysema. Emphysema (ICD10-J43.9). Critical Value/emergent results were called by telephone at the time of interpretation on 08/18/2022 at 4:56 pm to provider [facility] , who verbally acknowledged these results. Electronically Signed   By: Jasmine Pang M.D.   On: 08/18/2022 17:10   CT Maxillofacial Wo Contrast  Result Date: 08/18/2022 CLINICAL DATA:  Head trauma fall blood thinners EXAM: CT HEAD WITHOUT CONTRAST CT MAXILLOFACIAL WITHOUT CONTRAST CT CERVICAL SPINE WITHOUT CONTRAST TECHNIQUE: Multidetector CT imaging of the head, cervical spine, and maxillofacial structures were performed using the standard protocol without intravenous contrast. Multiplanar CT image reconstructions of the cervical spine and maxillofacial structures were also generated. RADIATION DOSE REDUCTION: This exam was performed according to the departmental dose-optimization program which includes automated exposure control, adjustment of the mA and/or kV according to patient size and/or use of iterative reconstruction technique. COMPARISON:  CT brain 06/21/2022 FINDINGS: CT HEAD FINDINGS Brain: Mixed density subdural hematoma along the right convexity with acute hemorrhage present, this measures up to 2.2 cm in maximum thickness on coronal series 5, image 25. Approximately 5 mm midline shift to the left. Mass effect on underlying brain parenchyma. Trace subarachnoid blood at the right temporal lobe. Encephalomalacia at the left  parietooccipital region. Mild chronic small vessel ischemic changes of the white matter. Stable retro cerebellar CSF density. Vascular: No hyperdense vessels.  Carotid vascular calcification. Skull: No depressed skull fracture. Other: Left forehead scalp hematoma. CT MAXILLOFACIAL FINDINGS Osseous: Mastoid air cells are clear. Mandibular heads are normally position. No mandibular fracture. Pterygoid plates and zygomatic arches appear intact. No acute nasal bone fracture Orbits: Negative. No traumatic or inflammatory finding. Sinuses: Debris in the maxillary sinuses. Mucous retention cysts. No definite sinus wall fracture Soft tissues: Left periorbital hematoma and laceration CT CERVICAL SPINE FINDINGS Alignment: Stable alignment.  Facet alignment within normal limits. Skull base and vertebrae: Craniovertebral junction appears grossly intact. Vertebral body heights are maintained. Lucency at the left superior facet of C3, sagittal series 8, image 67 through 69. This extends to the left C2-C3 facet joint. Margins appear somewhat sclerotic. Soft tissues and spinal canal: No prevertebral fluid or swelling. No visible canal hematoma. Disc levels: Anterior fusion C3 through C6. Moderate degenerative changes at C6-C7. Facet degenerative changes at multiple levels with foraminal stenosis. Upper chest: Emphysema. Right pleural effusion. Hazy upper lobe airspace disease is indeterminate for edema or pneumonia Other: None IMPRESSION: 1. Large mixed density subdural hematoma with acute blood along the right convexity measuring up to 2.2 cm in maximum thickness with mass effect on underlying brain parenchyma and right lateral ventricle; 5-6 mm midline shift to the left. Trace subarachnoid blood at the right temporal lobe. 2. Atrophy and chronic small vessel ischemic changes of the white matter 3. No acute facial bone fracture. 4. Anterior fusion C3 through C6. Lucency at the left superior facet of C3 extending to the left C2-C3  facet joint. Margins appear somewhat sclerotic and this may be secondary to subacute to chronic fracture but is new since 2019, correlate for focal pain. Otherwise no acute osseous abnormality. 5. Right pleural effusion. Hazy upper lobe airspace disease, indeterminate for edema or pneumonia. Emphysema. Emphysema (ICD10-J43.9). Critical Value/emergent results were called by telephone at the time of interpretation on 08/18/2022 at 4:56 pm to provider [facility] , who verbally acknowledged these results. Electronically Signed   By: Jasmine Pang M.D.   On: 08/18/2022 17:10    Pending Labs Unresulted Labs (From admission, onward)  None       Vitals/Pain Today's Vitals   08/18/22 2005 08/18/22 2027 08/18/22 2030 08/18/22 2039  BP:   (!) 100/53   Pulse:   75   Resp:   19   Temp: 98 F (36.7 C)     TempSrc: Oral     SpO2:   98%   Weight:      Height:      PainSc:  Asleep  0-No pain    Isolation Precautions No active isolations  Medications Medications  acetaminophen (TYLENOL) tablet 650 mg (has no administration in time range)    Or  acetaminophen (TYLENOL) suppository 650 mg (has no administration in time range)  HYDROcodone-acetaminophen (NORCO/VICODIN) 5-325 MG per tablet 1 tablet (has no administration in time range)  HYDROmorphone (DILAUDID) injection 0.5 mg (has no administration in time range)  docusate sodium (COLACE) capsule 100 mg (has no administration in time range)  polyethylene glycol (MIRALAX / GLYCOLAX) packet 17 g (has no administration in time range)  ondansetron (ZOFRAN) tablet 4 mg (has no administration in time range)    Or  ondansetron (ZOFRAN) injection 4 mg (has no administration in time range)  promethazine (PHENERGAN) tablet 12.5-25 mg (has no administration in time range)  labetalol (NORMODYNE) injection 10-40 mg (has no administration in time range)  famotidine (PEPCID) IVPB 20 mg premix (has no administration in time range)  lidocaine-EPINEPHrine  (XYLOCAINE W/EPI) 1 %-1:100000 (with pres) injection 10 mL (10 mLs Infiltration Given by Other 08/18/22 1730)  ondansetron (ZOFRAN) injection 4 mg (4 mg Intravenous Given 08/18/22 2004)    Mobility walks     Focused Assessments     R Recommendations: See Admitting Provider Note  Report given to:   Additional Notes:

## 2022-08-18 NOTE — Consult Note (Signed)
Neurosurgery Consultation  Reason for Consult: Subdural hematoma Referring Physician: Hyacinth Meeker  CC: Fall  HPI: This is a 82 y.o. man that presents after a fall. Of note, takes clopiogrel for extensive cardiac Hx. No new weakness, numbness, no headaches, no nausea. Other known injuries at this time include a forehad lac that was repaired and some ST changes.   ROS: A 14 point ROS was performed and is negative except as noted in the HPI.   PMHx:  Past Medical History:  Diagnosis Date   Arthritic-like pain    CAD (coronary artery disease)    a. apical MI 5/07 with LHC showing 50% pCFX, 30% pRCA, 99% pLAD, 99% mLAD, 75% dLAD (cypher DES x3 to LAD); b. ETT-myoview (12/08) 81% MPHR,  EF 64%, partially reversible inferoapical perfusion defect similar to prior study 12/07; c. 04/2016 MV: EF 59%, fixed anteroapical defect, no ischemia->Low risk.   Carotid arterial disease (HCC)    a. 05/2016 Carotid U/S: 30-40% bilat dzs, f/u in 2 yrs.   Chest pain 04/13/2022   CVA (cerebral vascular accident) [facility])    a. 07/2010 - h/o CVA/TIA.   Diet-controlled type 2 diabetes mellitus (HCC)    HLD (hyperlipidemia)    HTN (hypertension)    PAT (paroxysmal atrial tachycardia)    a. 06/2016 Event monitor:  rare episodes of atrial tachycardia, longest 7 beats. No afib.   FamHx:  Family History  Family history unknown: Yes   SocHx:  reports that he quit smoking about 37 years ago. His smoking use included cigarettes. He has a 25.00 pack-year smoking history. He has never used smokeless tobacco. He reports that he does not drink alcohol and does not use drugs.  Exam: Vital signs in last 24 hours: Temp:  [98.1 F (36.7 C)] 98.1 F (36.7 C) (05/17 1607) Pulse Rate:  [74-87] 81 (05/17 1745) Resp:  [15-23] 15 (05/17 1745) BP: (121-188)/(71-83) 121/71 (05/17 1745) SpO2:  [95 %-99 %] 99 % (05/17 1745) Weight:  [65.2 kg] 65.2 kg (05/17 1623) General: Awake, alert, cooperative, lying in bed in NAD Head:  Normocephalic, +L periorbital ecchymosis and head wrap in place HEENT: Neck supple, no pain w/ ROM, neg NEXUS exam Pulmonary: breathing room air comfortably, no evidence of increased work of breathing Cardiac: borderline tachy with rates in the 80s, regular Abdomen: S NT ND Extremities: Warm and well perfused x4, large left forearm ecchymosis Neuro: AOx3, PERRL, EOMI, FS Strength 5/5 x4, SILTx4   Assessment and Plan: 82 y.o. man s/p fall while on clopidogrel. CTH personally reviewed, which shows a large R convexity SDH with some mixed density. Of note, no chronic SDH present on his [facility] in March of this year.   -discussed with the patient, no indication for surgery at this time but given the clopidogrel certainly at risk of progression. He was quite certain that he would not want surgery should he decline. Will treat maximally, keep in the unit overnight given the cardiac changes, and see how he does.  -diet as tolerated -hold clopidogrel / ASA -C-spine findings on CT weren't present on his old CTA from 10y ago, appears degenerative and negative NEXUS criteria, if fractured it's a stable fracture pattern, he has no neck pain, can keep the C-collar off  Jadene Pierini, MD 08/18/22 6:19 PM [facility]

## 2022-08-18 NOTE — ED Notes (Signed)
Head wrapped added some pressure & more gauze d/t bleeding seeping through slowly.

## 2022-08-18 NOTE — ED Provider Notes (Signed)
[place] EMERGENCY DEPARTMENT AT [facility] Provider Note   CSN: [id number] Arrival date & time: 08/18/22  1559     History  Chief Complaint  Patient presents with   Fall on Thinners    Raymond Sampson is a 82 y.o. male.  HPI 82 year old male history of CAD, prior stroke, hypertension, hyperlipidemia, type 2 diabetes, chronic antiplatelet use with aspirin and Plavix presenting for fall.  Patient states he was going down the steps when he missed the bottom step and fell forward.  He hit his left forehead and has a laceration there.  He did not have any presyncope or syncope prior to the fall or after the fall.  Did not lose consciousness.  He denies any headache, nausea, vomiting, neck pain, back pain, abdominal pain, chest pain.  No pain in his extremities but does have bruising to his left arm.      Home Medications Prior to Admission medications   Medication Sig Start Date End Date Taking? Authorizing Provider  amLODipine (NORVASC) 5 MG tablet Take 1 tablet (5 mg total) by mouth daily. Patient not taking: Reported on 07/25/2022 07/03/22   Dimple Nanas, MD  aspirin 81 MG tablet Take 81 mg by mouth daily.    [provider]  atorvastatin (LIPITOR) 80 MG tablet Take 1 tablet (80 mg total) by mouth daily. 04/16/22   Esaw Grandchild A, DO  clopidogrel (PLAVIX) 75 MG tablet TAKE 1 TABLET BY MOUTH EVERY DAY WITH BREAKFAST 06/15/22   Antonieta Iba, MD  cyanocobalamin 1000 MCG tablet Take 1,000 mcg by mouth daily.    [provider]  feeding supplement (ENSURE ENLIVE / ENSURE PLUS) LIQD Take 237 mLs by mouth 3 (three) times daily between meals. 07/03/22   Amin, Ankit Chirag, MD  lisinopril (ZESTRIL) 20 MG tablet TAKE 1 TABLET BY MOUTH EVERY DAY 07/14/22   Antonieta Iba, MD  metFORMIN (GLUCOPHAGE) 1000 MG tablet Take 1 tablet (1,000 mg total) by mouth 2 (two) times daily with a meal. Resume 05/05/2022 05/03/22   Dunn, Raymon Mutton, PA-C  metoprolol tartrate  (LOPRESSOR) 25 MG tablet TAKE 1 TABLET BY MOUTH TWICE A DAY 07/14/22   Gollan, Tollie Pizza, MD  oxyCODONE (OXY IR/ROXICODONE) 5 MG immediate release tablet Take 0.5-1 tablets (2.5-5 mg total) by mouth every 6 (six) hours as needed for severe pain or moderate pain. Patient not taking: Reported on 07/25/2022 07/03/22   Dimple Nanas, MD  pantoprazole (PROTONIX) 20 MG tablet Take 1 tablet (20 mg total) by mouth daily. 04/16/22   Pennie Banter, DO  senna-docusate (SENOKOT-S) 8.6-50 MG tablet Take 2 tablets by mouth at bedtime as needed for moderate constipation. Patient not taking: Reported on 07/25/2022 07/03/22   Dimple Nanas, MD  sertraline (ZOLOFT) 25 MG tablet Take 1 tablet (25 mg total) by mouth daily. 04/26/16   Antonieta Iba, MD  sitaGLIPtin (JANUVIA) 25 MG tablet Take 1 tablet by mouth daily. 02/21/21   [provider]      Allergies    Metformin and related and Simvastatin    Review of Systems   Review of Systems  Skin:  Positive for wound.  All other systems reviewed and are negative.   Physical Exam Updated Vital Signs BP (!) 184/83   Pulse 77   Temp 98.1 F (36.7 C)   Resp 15   Ht 5\' 7"  (1.702 m)   Wt 65.2 kg   SpO2 99%   BMI 22.51  kg/m  Physical Exam Vitals and nursing note reviewed.  Constitutional:      General: He is not in acute distress.    Appearance: He is well-developed.  HENT:     Head: Normocephalic.     Comments: Approximately 1.5 cm vertical laceration just above the left eyebrow.  Venous oozing stops with pressure.  Some left-sided periorbital edema.  No hyphema.    Nose: Nose normal.     Mouth/Throat:     Mouth: Mucous membranes are dry.     Pharynx: Oropharynx is clear.  Eyes:     Conjunctiva/sclera: Conjunctivae normal.  Cardiovascular:     Rate and Rhythm: Normal rate and regular rhythm.     Heart sounds: No murmur heard. Pulmonary:     Effort: Pulmonary effort is normal. No respiratory distress.     Breath sounds: Normal  breath sounds.  Abdominal:     Palpations: Abdomen is soft.     Tenderness: There is no abdominal tenderness. There is no guarding or rebound.  Musculoskeletal:        General: No swelling.     Cervical back: Normal range of motion and neck supple. No rigidity or tenderness.     Comments: No spinal tenderness.  Bruising of the left forearm, no tenderness, full range of motion of the joints of the hand, wrist, elbow, shoulder without pain.  2+ radial pulse.  Skin:    General: Skin is warm and dry.     Capillary Refill: Capillary refill takes less than 2 seconds.  Neurological:     General: No focal deficit present.     Mental Status: He is alert and oriented to person, place, and time. Mental status is at baseline.     Cranial Nerves: No cranial nerve deficit.     Sensory: No sensory deficit.     Motor: No weakness.  Psychiatric:        Mood and Affect: Mood normal.     ED Results / Procedures / Treatments   Labs (all labs ordered are listed, but only abnormal results are displayed) Labs Reviewed  CBC WITH DIFFERENTIAL/PLATELET - Abnormal; Notable for the following components:      Result Value   RBC 3.30 (*)    Hemoglobin 9.9 (*)    HCT 31.3 (*)    All other components within normal limits  BASIC METABOLIC PANEL - Abnormal; Notable for the following components:   Glucose, Bld 148 (*)    Calcium 8.6 (*)    All other components within normal limits  PROTIME-INR  TROPONIN I (HIGH SENSITIVITY)    EKG EKG Interpretation  Date/Time:  Friday Aug 18 2022 16:18:34 EDT Ventricular Rate:  75 PR Interval:  155 QRS Duration: 89 QT Interval:  405 QTC Calculation: 453 R Axis:   -30 Text Interpretation: Sinus rhythm Left axis deviation Probable anterior infarct, age indeterminate significant abnormal findings on EKG ST segments new from prior Confirmed by Eber Hong ([id number]) on 08/18/2022 4:35:41 PM  Radiology CT Head Wo Contrast  Result Date: 08/18/2022 CLINICAL DATA:  Head  trauma fall blood thinners EXAM: CT HEAD WITHOUT CONTRAST CT MAXILLOFACIAL WITHOUT CONTRAST CT CERVICAL SPINE WITHOUT CONTRAST TECHNIQUE: Multidetector CT imaging of the head, cervical spine, and maxillofacial structures were performed using the standard protocol without intravenous contrast. Multiplanar CT image reconstructions of the cervical spine and maxillofacial structures were also generated. RADIATION DOSE REDUCTION: This exam was performed according to the departmental dose-optimization program which includes automated exposure control, adjustment  of the mA and/or kV according to patient size and/or use of iterative reconstruction technique. COMPARISON:  CT brain 06/21/2022 FINDINGS: CT HEAD FINDINGS Brain: Mixed density subdural hematoma along the right convexity with acute hemorrhage present, this measures up to 2.2 cm in maximum thickness on coronal series 5, image 25. Approximately 5 mm midline shift to the left. Mass effect on underlying brain parenchyma. Trace subarachnoid blood at the right temporal lobe. Encephalomalacia at the left parietooccipital region. Mild chronic small vessel ischemic changes of the white matter. Stable retro cerebellar CSF density. Vascular: No hyperdense vessels.  Carotid vascular calcification. Skull: No depressed skull fracture. Other: Left forehead scalp hematoma. CT MAXILLOFACIAL FINDINGS Osseous: Mastoid air cells are clear. Mandibular heads are normally position. No mandibular fracture. Pterygoid plates and zygomatic arches appear intact. No acute nasal bone fracture Orbits: Negative. No traumatic or inflammatory finding. Sinuses: Debris in the maxillary sinuses. Mucous retention cysts. No definite sinus wall fracture Soft tissues: Left periorbital hematoma and laceration CT CERVICAL SPINE FINDINGS Alignment: Stable alignment.  Facet alignment within normal limits. Skull base and vertebrae: Craniovertebral junction appears grossly intact. Vertebral body heights are  maintained. Lucency at the left superior facet of C3, sagittal series 8, image 67 through 69. This extends to the left C2-C3 facet joint. Margins appear somewhat sclerotic. Soft tissues and spinal canal: No prevertebral fluid or swelling. No visible canal hematoma. Disc levels: Anterior fusion C3 through C6. Moderate degenerative changes at C6-C7. Facet degenerative changes at multiple levels with foraminal stenosis. Upper chest: Emphysema. Right pleural effusion. Hazy upper lobe airspace disease is indeterminate for edema or pneumonia Other: None IMPRESSION: 1. Large mixed density subdural hematoma with acute blood along the right convexity measuring up to 2.2 cm in maximum thickness with mass effect on underlying brain parenchyma and right lateral ventricle; 5-6 mm midline shift to the left. Trace subarachnoid blood at the right temporal lobe. 2. Atrophy and chronic small vessel ischemic changes of the white matter 3. No acute facial bone fracture. 4. Anterior fusion C3 through C6. Lucency at the left superior facet of C3 extending to the left C2-C3 facet joint. Margins appear somewhat sclerotic and this may be secondary to subacute to chronic fracture but is new since 2019, correlate for focal pain. Otherwise no acute osseous abnormality. 5. Right pleural effusion. Hazy upper lobe airspace disease, indeterminate for edema or pneumonia. Emphysema. Emphysema (ICD10-J43.9). Critical Value/emergent results were called by telephone at the time of interpretation on 08/18/2022 at 4:56 pm to provider [facility] , who verbally acknowledged these results. Electronically Signed   By: Jasmine Pang M.D.   On: 08/18/2022 17:10   CT Cervical Spine Wo Contrast  Result Date: 08/18/2022 CLINICAL DATA:  Head trauma fall blood thinners EXAM: CT HEAD WITHOUT CONTRAST CT MAXILLOFACIAL WITHOUT CONTRAST CT CERVICAL SPINE WITHOUT CONTRAST TECHNIQUE: Multidetector CT imaging of the head, cervical spine, and maxillofacial structures  were performed using the standard protocol without intravenous contrast. Multiplanar CT image reconstructions of the cervical spine and maxillofacial structures were also generated. RADIATION DOSE REDUCTION: This exam was performed according to the departmental dose-optimization program which includes automated exposure control, adjustment of the mA and/or kV according to patient size and/or use of iterative reconstruction technique. COMPARISON:  CT brain 06/21/2022 FINDINGS: CT HEAD FINDINGS Brain: Mixed density subdural hematoma along the right convexity with acute hemorrhage present, this measures up to 2.2 cm in maximum thickness on coronal series 5, image 25. Approximately 5 mm midline shift to the left.  Mass effect on underlying brain parenchyma. Trace subarachnoid blood at the right temporal lobe. Encephalomalacia at the left parietooccipital region. Mild chronic small vessel ischemic changes of the white matter. Stable retro cerebellar CSF density. Vascular: No hyperdense vessels.  Carotid vascular calcification. Skull: No depressed skull fracture. Other: Left forehead scalp hematoma. CT MAXILLOFACIAL FINDINGS Osseous: Mastoid air cells are clear. Mandibular heads are normally position. No mandibular fracture. Pterygoid plates and zygomatic arches appear intact. No acute nasal bone fracture Orbits: Negative. No traumatic or inflammatory finding. Sinuses: Debris in the maxillary sinuses. Mucous retention cysts. No definite sinus wall fracture Soft tissues: Left periorbital hematoma and laceration CT CERVICAL SPINE FINDINGS Alignment: Stable alignment.  Facet alignment within normal limits. Skull base and vertebrae: Craniovertebral junction appears grossly intact. Vertebral body heights are maintained. Lucency at the left superior facet of C3, sagittal series 8, image 67 through 69. This extends to the left C2-C3 facet joint. Margins appear somewhat sclerotic. Soft tissues and spinal canal: No prevertebral  fluid or swelling. No visible canal hematoma. Disc levels: Anterior fusion C3 through C6. Moderate degenerative changes at C6-C7. Facet degenerative changes at multiple levels with foraminal stenosis. Upper chest: Emphysema. Right pleural effusion. Hazy upper lobe airspace disease is indeterminate for edema or pneumonia Other: None IMPRESSION: 1. Large mixed density subdural hematoma with acute blood along the right convexity measuring up to 2.2 cm in maximum thickness with mass effect on underlying brain parenchyma and right lateral ventricle; 5-6 mm midline shift to the left. Trace subarachnoid blood at the right temporal lobe. 2. Atrophy and chronic small vessel ischemic changes of the white matter 3. No acute facial bone fracture. 4. Anterior fusion C3 through C6. Lucency at the left superior facet of C3 extending to the left C2-C3 facet joint. Margins appear somewhat sclerotic and this may be secondary to subacute to chronic fracture but is new since 2019, correlate for focal pain. Otherwise no acute osseous abnormality. 5. Right pleural effusion. Hazy upper lobe airspace disease, indeterminate for edema or pneumonia. Emphysema. Emphysema (ICD10-J43.9). Critical Value/emergent results were called by telephone at the time of interpretation on 08/18/2022 at 4:56 pm to provider [facility] , who verbally acknowledged these results. Electronically Signed   By: Jasmine Pang M.D.   On: 08/18/2022 17:10   CT Maxillofacial Wo Contrast  Result Date: 08/18/2022 CLINICAL DATA:  Head trauma fall blood thinners EXAM: CT HEAD WITHOUT CONTRAST CT MAXILLOFACIAL WITHOUT CONTRAST CT CERVICAL SPINE WITHOUT CONTRAST TECHNIQUE: Multidetector CT imaging of the head, cervical spine, and maxillofacial structures were performed using the standard protocol without intravenous contrast. Multiplanar CT image reconstructions of the cervical spine and maxillofacial structures were also generated. RADIATION DOSE REDUCTION: This exam was  performed according to the departmental dose-optimization program which includes automated exposure control, adjustment of the mA and/or kV according to patient size and/or use of iterative reconstruction technique. COMPARISON:  CT brain 06/21/2022 FINDINGS: CT HEAD FINDINGS Brain: Mixed density subdural hematoma along the right convexity with acute hemorrhage present, this measures up to 2.2 cm in maximum thickness on coronal series 5, image 25. Approximately 5 mm midline shift to the left. Mass effect on underlying brain parenchyma. Trace subarachnoid blood at the right temporal lobe. Encephalomalacia at the left parietooccipital region. Mild chronic small vessel ischemic changes of the white matter. Stable retro cerebellar CSF density. Vascular: No hyperdense vessels.  Carotid vascular calcification. Skull: No depressed skull fracture. Other: Left forehead scalp hematoma. CT MAXILLOFACIAL FINDINGS Osseous: Mastoid air cells are  clear. Mandibular heads are normally position. No mandibular fracture. Pterygoid plates and zygomatic arches appear intact. No acute nasal bone fracture Orbits: Negative. No traumatic or inflammatory finding. Sinuses: Debris in the maxillary sinuses. Mucous retention cysts. No definite sinus wall fracture Soft tissues: Left periorbital hematoma and laceration CT CERVICAL SPINE FINDINGS Alignment: Stable alignment.  Facet alignment within normal limits. Skull base and vertebrae: Craniovertebral junction appears grossly intact. Vertebral body heights are maintained. Lucency at the left superior facet of C3, sagittal series 8, image 67 through 69. This extends to the left C2-C3 facet joint. Margins appear somewhat sclerotic. Soft tissues and spinal canal: No prevertebral fluid or swelling. No visible canal hematoma. Disc levels: Anterior fusion C3 through C6. Moderate degenerative changes at C6-C7. Facet degenerative changes at multiple levels with foraminal stenosis. Upper chest: Emphysema.  Right pleural effusion. Hazy upper lobe airspace disease is indeterminate for edema or pneumonia Other: None IMPRESSION: 1. Large mixed density subdural hematoma with acute blood along the right convexity measuring up to 2.2 cm in maximum thickness with mass effect on underlying brain parenchyma and right lateral ventricle; 5-6 mm midline shift to the left. Trace subarachnoid blood at the right temporal lobe. 2. Atrophy and chronic small vessel ischemic changes of the white matter 3. No acute facial bone fracture. 4. Anterior fusion C3 through C6. Lucency at the left superior facet of C3 extending to the left C2-C3 facet joint. Margins appear somewhat sclerotic and this may be secondary to subacute to chronic fracture but is new since 2019, correlate for focal pain. Otherwise no acute osseous abnormality. 5. Right pleural effusion. Hazy upper lobe airspace disease, indeterminate for edema or pneumonia. Emphysema. Emphysema (ICD10-J43.9). Critical Value/emergent results were called by telephone at the time of interpretation on 08/18/2022 at 4:56 pm to provider [facility] , who verbally acknowledged these results. Electronically Signed   By: Jasmine Pang M.D.   On: 08/18/2022 17:10    Procedures Procedures    Medications Ordered in ED Medications - No data to display  ED Course/ Medical Decision Making/ A&P Clinical Course as of 08/18/22 1716  Fri Aug 18, 2022  1629 EKG normal sinus rhythm, normal intervals, biphasic T waves in V2 to V4 with some coved ST elevation, no reciprocal changes.   [JD]  1708 Neurosurgery: AM [facility], ICU overnight [JD]    Clinical Course User Index [JD] Fulton Reek, MD                             Medical Decision Making Amount and/or Complexity of Data Reviewed Labs: ordered. Radiology: ordered.  Risk Decision regarding hospitalization.   82 year old male presenting for fall.  Patient presents as a level 2 trauma given antiplatelet use.  On arrival he is GCS  15, hemodynamically stable.  He is asymptomatic but does have significant laceration, dressing reapplied.  Obtain basic labs, CT head, cervical spine, face for evaluation of traumatic injury.  No clear signs of ocular trauma or other musculoskeletal injury at this time.  No symptoms or signs of syncope, fall seems mechanical.  EKG noted to have biphasic T waves with ST elevation in V2 and V4 with T wave inversion which appears worsened compared to prior.  He is chest pain-free, we did discuss this with cardiology.  We suspect this is due to large right-sided subdural hematoma seen on CT scan.  Will trend troponin for evaluation of ACS.  Neurosurgery was consulted for evaluation  of subdural hematoma on aspirin and Plavix.  Is not on anticoagulation, but did have stents placed in January of this year for CAD.  CT face reviewed, no signs of acute facial fracture.  He has lucency through the left C3 superior facet joint, favored subacute to chronic.  Has no pain on exam or tenderness, low concern for acute cervical spine fracture.  Plan to admit to ICU under neurosurgery for further care.  Patient and family updated at bedside.        Final Clinical Impression(s) / ED Diagnoses Final diagnoses:  SDH (subdural hematoma) (HCC)  Facial laceration, initial encounter  Abnormal EKG    Rx / DC Orders ED Discharge Orders     None         Fulton Reek, MD 08/18/22 1716    Eber Hong, MD 08/18/22 1816

## 2022-08-18 NOTE — ED Notes (Signed)
Provider at bedside to stitch forehead lac at this time.

## 2022-08-18 NOTE — ED Provider Notes (Signed)
I saw and evaluated the patient, reviewed the resident's note and I agree with the findings and plan.  Pertinent History: Elderly 82 year old male presents after having an accidental fall striking his head causing a laceration and bleeding, seen prehospital by paramedics who wrapped it up and transported to the hospital, some confusion about the year according to the paramedics.  No vomiting, no seizures, he did have urinary incontinence but he did not lose any consciousness  Pertinent Exam findings: Laceration to the left forehead, periorbital ecchymosis on the left, no malocclusion, totally normal range of motion of all 4 extremities, no tenderness over the chest or abdomen.  I was personally present and directly supervised the following procedures:  Laceration repair Trauma resuscitation  I personally interpreted the EKG as well as the resident and agree with the interpretation on the resident's chart.  .Critical Care  Performed by: Eber Hong, MD Authorized by: Eber Hong, MD   Critical care provider statement:    Critical care time (minutes):  45   Critical care time was exclusive of:  Separately billable procedures and treating other patients   Critical care was necessary to treat or prevent imminent or life-threatening deterioration of the following conditions:  CNS failure or compromise and trauma   Critical care was time spent personally by me on the following activities:  Development of treatment plan with patient or surrogate, discussions with consultants, evaluation of patient's response to treatment, examination of patient, obtaining history from patient or surrogate, review of old charts, re-evaluation of patient's condition, pulse oximetry, ordering and review of radiographic studies, ordering and review of laboratory studies and ordering and performing treatments and interventions   I assumed direction of critical care for this patient from another provider in my  specialty: no     Care discussed with: admitting provider   Comments:         EKG Interpretation  Date/Time:  Friday Aug 18 2022 16:18:34 EDT Ventricular Rate:  75 PR Interval:  155 QRS Duration: 89 QT Interval:  405 QTC Calculation: 453 R Axis:   -30 Text Interpretation: Sinus rhythm Left axis deviation Probable anterior infarct, age indeterminate significant abnormal findings on EKG ST segments new from prior Confirmed by Eber Hong ([id number]) on 08/18/2022 4:35:41 PM       This patient is critically ill with a large subdural hematoma, there is midline shift, neurosurgery has been consulted.  I also spoke with cardiology regarding the abnormal EKG and we are all in agreement that this is likely secondary to the patient's trauma and intracranial injury, troponins will be trended, the patient is chest pain-free and has not had chest pain today.  He does have a history of coronary disease and is on aspirin and Plavix.  Neurologic exam throughout the emergency department stay and prior to neurology consultation was stable, awake alert and able to follow commands.   Final diagnoses:  SDH (subdural hematoma) (HCC)  Facial laceration, initial encounter      Eber Hong, MD 08/18/22 2215

## 2022-08-18 NOTE — Progress Notes (Signed)
   08/18/22 1648  Spiritual Encounters  Type of Visit Initial  Care provided to: Pt and family  Conversation partners present during encounter Nurse  Referral source Code page  Reason for visit Routine spiritual support  OnCall Visit No  Spiritual Framework  Presenting Themes Meaning/purpose/sources of inspiration  Community/Connection Friend(s)  Interventions  Spiritual Care Interventions Made Compassionate presence;Reflective listening;Mindfulness intervention  Intervention Outcomes  Outcomes Reduced fear;Reduced anxiety;Awareness of support   Responded to level 2, visited with patient and wife. Daughter in law also present. Patient was alert and observed interaction however he remained silent. Not sure if he understood chaplain's presence. He seemed as if he did not want to talk with spouse of chaplain, just wanted to go home.  Talked/listened with spouse while medical team worked with patient. Spouse indicated after a few minutes that her support team (family) showed up for her. Advised spouse and daughter in law if they need any assistance please call the chaplain.

## 2022-08-18 NOTE — ED Notes (Signed)
Stool/urinary incontinence care provided , repositioned on bed , adult brief applied /warm blankets applied.

## 2022-08-18 NOTE — Progress Notes (Signed)
Orthopedic Tech Progress Note Patient Details:  Raymond Sampson 12/05/1940 [id number]  Level II trauma, ortho tech not needed at this time.  Patient ID: Raymond Sampson, male   DOB: [birth year], 82 y.o.   MRN: [id number]  Docia Furl 08/18/2022, 6:20 PM

## 2022-08-19 ENCOUNTER — Inpatient Hospital Stay (HOSPITAL_COMMUNITY): Payer: PPO

## 2022-08-19 LAB — MRSA NEXT GEN BY PCR, NASAL: MRSA by PCR Next Gen: DETECTED — AB

## 2022-08-19 MED ORDER — MUPIROCIN 2 % EX OINT
1.0000 | TOPICAL_OINTMENT | Freq: Two times a day (BID) | CUTANEOUS | Status: AC
  Administered 2022-08-19 – 2022-08-23 (×9): 1 via NASAL
  Filled 2022-08-19: qty 22

## 2022-08-19 MED ORDER — CHLORHEXIDINE GLUCONATE CLOTH 2 % EX PADS
6.0000 | MEDICATED_PAD | Freq: Every day | CUTANEOUS | Status: DC
  Administered 2022-08-19 – 2022-08-26 (×8): 6 via TOPICAL

## 2022-08-19 NOTE — Progress Notes (Signed)
Low UOP at 150cc from 0600-1610.  UOP 200 cc for night shift.  Bladder scan 88cc.  MD notified.

## 2022-08-19 NOTE — Evaluation (Signed)
Clinical/Bedside Swallow Evaluation Patient Details  Name: Raymond Sampson MRN: [id number] Date of Birth: 1940-08-28  Today's Date: 08/19/2022 Time: SLP Start Time (ACUTE ONLY): 2952 SLP Stop Time (ACUTE ONLY): 1003 SLP Time Calculation (min) (ACUTE ONLY): 36 min  Past Medical History:  Past Medical History:  Diagnosis Date   Arthritic-like pain    CAD (coronary artery disease)    a. apical MI 5/07 with LHC showing 50% pCFX, 30% pRCA, 99% pLAD, 99% mLAD, 75% dLAD (cypher DES x3 to LAD); b. ETT-myoview (12/08) 81% MPHR,  EF 64%, partially reversible inferoapical perfusion defect similar to prior study 12/07; c. 04/2016 MV: EF 59%, fixed anteroapical defect, no ischemia->Low risk.   Carotid arterial disease (HCC)    a. 05/2016 Carotid U/S: 30-40% bilat dzs, f/u in 2 yrs.   Chest pain 04/13/2022   CVA (cerebral vascular accident) [facility])    a. 07/2010 - h/o CVA/TIA.   Diet-controlled type 2 diabetes mellitus (HCC)    HLD (hyperlipidemia)    HTN (hypertension)    PAT (paroxysmal atrial tachycardia)    a. 06/2016 Event monitor:  rare episodes of atrial tachycardia, longest 7 beats. No afib.   Past Surgical History:  Past Surgical History:  Procedure Laterality Date   BACK SURGERY     CARDIAC CATHETERIZATION     CORONARY ANGIOPLASTY WITH STENT PLACEMENT     apical MI 5/07 with LHC showing 50% pCFX, 30% pRCA, 99% pLAD, 9%% mLAD, 75% dLAD. cypher DES x3 to LAD. ETT-myoview (12/08) 81% MPHR, 8'1, EF 64%, partially reversible inferoapical perfusion defect similar to prior study 12/07   CORONARY IMAGING/OCT N/A 04/14/2022   Procedure: INTRAVASCULAR IMAGING/OCT;  Surgeon: Yvonne Kendall, MD;  Location: [facility] INVASIVE CV LAB;  Service: Cardiovascular;  Laterality: N/A;   CORONARY STENT INTERVENTION N/A 04/14/2022   Procedure: CORONARY STENT INTERVENTION;  Surgeon: Yvonne Kendall, MD;  Location: [facility] INVASIVE CV LAB;  Service: Cardiovascular;  Laterality: N/A;   CORONARY STENT INTERVENTION Left  05/02/2022   Procedure: CORONARY STENT INTERVENTION;  Surgeon: Yvonne Kendall, MD;  Location: [facility] INVASIVE CV LAB;  Service: Cardiovascular;  Laterality: Left;   LEFT HEART CATH AND CORONARY ANGIOGRAPHY N/A 04/13/2022   Procedure: LEFT HEART CATH AND CORONARY ANGIOGRAPHY;  Surgeon: Marykay Lex, MD;  Location: [facility] INVASIVE CV LAB;  Service: Cardiovascular;  Laterality: N/A;   HPI:  82 year old male who presented for fall. Hit forehead with laceration repair, no LOC, no facial fx, but CT 5/18 revealed: "stable large, approximately 17 mm thick Right Subdural Hematoma, most pronounced along the anterior right frontal convexity. Stable small volume right hemisphere [facility]."  Chest CT 5/17: "Emphysema. Right pleural effusion. Hazy upper lobe  airspace disease is indeterminate for edema or pneumonia."  Pt with history of CAD, prior stroke, hypertension, hyperlipidemia, type 2 diabetes, chronic antiplatelet use with aspirin and Plavix.    Assessment / Plan / Recommendation  Clinical Impression  Pt presents with clinical indicators of pharyngeal dysphagia.  There was intermittent coughing with thin liquids, pt required multiple swallows.  Cough was more frequent with straw than cup sips.  In one instance there was prolonged cough, red-face, desaturation with slow recovery with straw sip.  RN placed on Stark following this incident.  Pt exhibited good oral clearance of puree and regular solid. There was delayed, dry cough following one trial of applesauce.  Pt unable to follow directions for OME during evaluation, but with possible slight L facial assymetry at rest.  Pt consumes a regular diet at  home with some swallow strategies used given hx of dementia and CVA.  Pt had clinical swallow evaluation 06/21/22 at [facility] with recs for reg/thin with precautions, but no follow up needed.  Pt has had repeated hospitalizations this year, but no hx pna, although CXR from this admission does not r/o pna. Pt would benefit from  instrumental swallow assessment but appears safe to initiate a modified PO diet at this time.  Will plan for MBS early next week.  If pt exhibits any difficulties with modified diet please, reach out to speech therapy via [facility] SLP group on secure chat, or at Acute Rehab Dept phone [phone].  Recommend regular texture diet with nectar thick liquids. SLP Visit Diagnosis: Dysphagia, unspecified (R13.10)    Aspiration Risk  Mild aspiration risk    Diet Recommendation Regular;Nectar-thick liquid   Liquid Administration via: Cup;Straw Medication Administration: Whole meds with liquid Supervision: Staff to assist with self feeding Compensations: Slow rate;Small sips/bites Postural Changes: Seated upright at 90 degrees    Other  Recommendations Oral Care Recommendations: Oral care BID    Recommendations for follow up therapy are one component of a multi-disciplinary discharge planning process, led by the attending physician.  Recommendations may be updated based on patient status, additional functional criteria and insurance authorization.  Follow up Recommendations  (TBD)      Assistance Recommended at Discharge    Functional Status Assessment  (TBD)  Frequency and Duration  (TBD)          Prognosis Prognosis for improved oropharyngeal function:  (TBD)      Swallow Study   General Date of Onset: 08/18/22 HPI: 82 year old male who presented for fall. Hit forehead with laceration repair, no LOC, no facial fx, but CT 5/18 revealed: "stable large, approximately 17 mm thick Right Subdural Hematoma, most pronounced along the anterior right frontal convexity. Stable small volume right hemisphere [facility]."  Chest CT 5/17: "Emphysema. Right pleural effusion. Hazy upper lobe  airspace disease is indeterminate for edema or pneumonia."  Pt with history of CAD, prior stroke, hypertension, hyperlipidemia, type 2 diabetes, chronic antiplatelet use with aspirin and Plavix. Type of Study: Bedside Swallow  Evaluation Previous Swallow Assessment: BSE 06/21/22 at [facility] (student SLP) Diet Prior to this Study: NPO Temperature Spikes Noted: No Respiratory Status: Room air History of Recent Intubation: No Behavior/Cognition: Alert;Cooperative;Requires cueing Oral Cavity Assessment: Within Functional Limits Oral Care Completed by SLP: No Oral Cavity - Dentition: Dentures, top Self-Feeding Abilities: Needs assist Patient Positioning: Upright in bed Baseline Vocal Quality: Normal Volitional Cough: Cognitively unable to elicit Volitional Swallow: Unable to elicit    Oral/Motor/Sensory Function Overall Oral Motor/Sensory Function: Mild impairment (could not assess) Facial Symmetry: Abnormal symmetry left Velum: Within Functional Limits Mandible: Within Functional Limits   Ice Chips Ice chips: Within functional limits Presentation: Spoon   Thin Liquid Thin Liquid: Impaired Presentation: Cup;Straw Pharyngeal  Phase Impairments: Multiple swallows;Cough - Immediate;Change in Vital Signs    Nectar Thick Nectar Thick Liquid: Within functional limits Presentation: Cup;Straw   Honey Thick Honey Thick Liquid: Not tested   Puree Puree: Within functional limits Presentation: Spoon   Solid     Solid: Within functional limits Presentation:  (SLP fed)      Kerrie Pleasure, MA, CCC-SLP Acute Rehabilitation Services Office: [phone] 08/19/2022,10:26 AM

## 2022-08-19 NOTE — H&P (Signed)
Neurosurgery H&P   Reason for Consult: Subdural hematoma Referring Physician: Hyacinth Meeker   CC: Fall   HPI: This is a 82 y.o. man that presents after a fall. Of note, takes clopiogrel for extensive cardiac Hx. No new weakness, numbness, no headaches, no nausea. Other known injuries at this time include a forehad lac that was repaired and some ST changes.    ROS: A 14 point ROS was performed and is negative except as noted in the HPI.    PMHx:      Past Medical History:  Diagnosis Date   Arthritic-like pain     CAD (coronary artery disease)      a. apical MI 5/07 with LHC showing 50% pCFX, 30% pRCA, 99% pLAD, 99% mLAD, 75% dLAD (cypher DES x3 to LAD); b. ETT-myoview (12/08) 81% MPHR,  EF 64%, partially reversible inferoapical perfusion defect similar to prior study 12/07; c. 04/2016 MV: EF 59%, fixed anteroapical defect, no ischemia->Low risk.   Carotid arterial disease (HCC)      a. 05/2016 Carotid U/S: 30-40% bilat dzs, f/u in 2 yrs.   Chest pain 04/13/2022   CVA (cerebral vascular accident) [facility])      a. 07/2010 - h/o CVA/TIA.   Diet-controlled type 2 diabetes mellitus (HCC)     HLD (hyperlipidemia)     HTN (hypertension)     PAT (paroxysmal atrial tachycardia)      a. 06/2016 Event monitor:  rare episodes of atrial tachycardia, longest 7 beats. No afib.    FamHx:  Family History  Family history unknown: Yes    SocHx:  reports that he quit smoking about 37 years ago. His smoking use included cigarettes. He has a 25.00 pack-year smoking history. He has never used smokeless tobacco. He reports that he does not drink alcohol and does not use drugs.   Exam: Vital signs in last 24 hours: Temp:  [98.1 F (36.7 C)] 98.1 F (36.7 C) (05/17 1607) Pulse Rate:  [74-87] 81 (05/17 1745) Resp:  [15-23] 15 (05/17 1745) BP: (121-188)/(71-83) 121/71 (05/17 1745) SpO2:  [95 %-99 %] 99 % (05/17 1745) Weight:  [65.2 kg] 65.2 kg (05/17 1623) General: Awake, alert, cooperative, lying in bed in  NAD Head: Normocephalic, +L periorbital ecchymosis and head wrap in place HEENT: Neck supple, no pain w/ ROM, neg NEXUS exam Pulmonary: breathing room air comfortably, no evidence of increased work of breathing Cardiac: borderline tachy with rates in the 80s, regular Abdomen: S NT ND Extremities: Warm and well perfused x4, large left forearm ecchymosis Neuro: AOx3, PERRL, EOMI, FS Strength 5/5 x4, SILTx4     Assessment and Plan: 82 y.o. man s/p fall while on clopidogrel. CTH personally reviewed, which shows a large R convexity SDH with some mixed density. Of note, no chronic SDH present on his [facility] in March of this year.    -discussed with the patient, no indication for surgery at this time but given the clopidogrel certainly at risk of progression. He was quite certain that he would not want surgery should he decline. Will treat maximally, keep in the unit overnight given the cardiac changes, and see how he does.  -diet as tolerated -hold clopidogrel / ASA -C-spine findings on CT weren't present on his old CTA from 10y ago, appears degenerative and negative NEXUS criteria, if fractured it's a stable fracture pattern, he has no neck pain, can keep the C-collar off   Jadene Pierini, MD 08/18/22 6:19 PM [facility]

## 2022-08-19 NOTE — Progress Notes (Signed)
Patient and spouse had DNR  forms filled up and wanted to continue being DNR, on call MD notified to change the CODE status

## 2022-08-19 NOTE — Progress Notes (Signed)
Neurosurgery Service Progress Note  Subjective: No acute events overnight, no new complaints   Objective: Vitals:   08/19/22 0600 08/19/22 0630 08/19/22 0700 08/19/22 0800  BP: (!) 119/59 (!) 118/58 (!) 117/58 104/60  Pulse: 80 83 82 82  Resp: 20 18 14 13   Temp:    98 F (36.7 C)  TempSrc:    Oral  SpO2: 98% 97% 97% 99%  Weight:      Height:        Physical Exam: Aox3, PERRL, EOMI, FS & SS, strength 5/5x4  Assessment & Plan: 82 y.o. man s/p fall on clopidogrel.  -rpt CTH stable -hold clopidogrel x1 week -transfer out of unit -PT/OT -troponins 20 and 24 -SCDs, TEDs, SQH tomorrow  Jadene Pierini  08/19/22 9:12 AM

## 2022-08-20 ENCOUNTER — Inpatient Hospital Stay (HOSPITAL_COMMUNITY): Payer: PPO

## 2022-08-20 MED ORDER — ORAL CARE MOUTH RINSE
15.0000 mL | OROMUCOSAL | Status: DC
  Administered 2022-08-20 – 2022-09-03 (×55): 15 mL via OROMUCOSAL

## 2022-08-20 MED ORDER — HEPARIN SODIUM (PORCINE) 5000 UNIT/ML IJ SOLN
5000.0000 [IU] | Freq: Three times a day (TID) | INTRAMUSCULAR | Status: DC
  Administered 2022-08-20 – 2022-08-25 (×14): 5000 [IU] via SUBCUTANEOUS
  Filled 2022-08-20 (×14): qty 1

## 2022-08-20 MED ORDER — ORAL CARE MOUTH RINSE: 15.0000 mL | OROMUCOSAL | Status: DC | PRN

## 2022-08-20 MED ORDER — PANTOPRAZOLE SODIUM 20 MG PO TBEC
20.0000 mg | DELAYED_RELEASE_TABLET | Freq: Every day | ORAL | Status: DC
  Administered 2022-08-20: 20 mg via ORAL
  Filled 2022-08-20 (×2): qty 1

## 2022-08-20 NOTE — Progress Notes (Signed)
Neurosurgery Service Progress Note  Subjective: No acute events overnight. No new complaints.    Objective: Vitals:   08/20/22 0400 08/20/22 0600 08/20/22 0700 08/20/22 0800  BP: 120/72 136/72 136/70 (!) 143/58  Pulse: 82 84 85 81  Resp: 17 13 13 17   Temp: 97.7 F (36.5 C)     TempSrc: Oral     SpO2: 98% 100% 99% 99%  Weight:      Height:        Physical Exam: Aox3, PERRL, EOMI, FS & SS, strength 5/5x4   Assessment & Plan: 82 y.o. man with SDH s/p fall on clopidogrel.   -rpt CTH stable -hold clopidogrel x1 week -transfer out of unit -PT/OT -SCDs, TEDs, SQH  -plan for discharge back to SNF tomorrow, was previously residing there for rehab prior to this injury.   Emilee Hero, PA-C 08/20/22 10:03 AM

## 2022-08-20 NOTE — Progress Notes (Signed)
Patients pupils noted to no longer be equal, increased lethargy, strong left facial droop, and left sided weakness. Notified neurosurgery Cosentino PA. Stat CTH ordered.

## 2022-08-20 NOTE — TOC CAGE-AID Note (Signed)
Transition of Care [facility]) - CAGE-AID Screening  Patient Details  Name: Raymond Sampson MRN: [id number] Date of Birth: 09-30-1940  Clinical Narrative:  Patient to ED after a traumatic fall while walking out of his house. Patient denies any current alcohol or drug use, no need for substance abuse resources at this time.  CAGE-AID Screening:   Have You Ever Felt You Ought to Cut Down on Your Drinking or Drug Use?: No Have People Annoyed You By Critizing Your Drinking Or Drug Use?: No Have You Felt Bad Or Guilty About Your Drinking Or Drug Use?: No Have You Ever Had a Drink or Used Drugs First Thing In The Morning to Steady Your Nerves or to Get Rid of a Hangover?: No CAGE-AID Score: 0  Substance Abuse Education Offered: No

## 2022-08-21 ENCOUNTER — Inpatient Hospital Stay (HOSPITAL_COMMUNITY): Payer: PPO

## 2022-08-21 ENCOUNTER — Other Ambulatory Visit (HOSPITAL_COMMUNITY): Payer: PPO

## 2022-08-21 DIAGNOSIS — G40901 Epilepsy, unspecified, not intractable, with status epilepticus: Secondary | ICD-10-CM | POA: Diagnosis not present

## 2022-08-21 DIAGNOSIS — S065XAA Traumatic subdural hemorrhage with loss of consciousness status unknown, initial encounter: Secondary | ICD-10-CM | POA: Diagnosis not present

## 2022-08-21 DIAGNOSIS — R9389 Abnormal findings on diagnostic imaging of other specified body structures: Secondary | ICD-10-CM

## 2022-08-21 DIAGNOSIS — R531 Weakness: Secondary | ICD-10-CM

## 2022-08-21 DIAGNOSIS — R4182 Altered mental status, unspecified: Secondary | ICD-10-CM

## 2022-08-21 DIAGNOSIS — I639 Cerebral infarction, unspecified: Secondary | ICD-10-CM

## 2022-08-21 LAB — COMPREHENSIVE METABOLIC PANEL
ALT: 18 U/L (ref 0–44)
AST: 19 U/L (ref 15–41)
Albumin: 2.8 g/dL — ABNORMAL LOW (ref 3.5–5.0)
Alkaline Phosphatase: 93 U/L (ref 38–126)
Anion gap: 8 (ref 5–15)
BUN: 26 mg/dL — ABNORMAL HIGH (ref 8–23)
CO2: 27 mmol/L (ref 22–32)
Calcium: 8.6 mg/dL — ABNORMAL LOW (ref 8.9–10.3)
Chloride: 97 mmol/L — ABNORMAL LOW (ref 98–111)
Creatinine, Ser: 1.15 mg/dL (ref 0.61–1.24)
GFR, Estimated: >60 mL/min (ref >60)
Glucose, Bld: 204 mg/dL — ABNORMAL HIGH (ref 70–99)
Potassium: 4.5 mmol/L (ref 3.5–5.1)
Sodium: 132 mmol/L — ABNORMAL LOW (ref 135–145)
Total Bilirubin: 0.8 mg/dL (ref 0.3–1.2)
Total Protein: 6.4 g/dL — ABNORMAL LOW (ref 6.5–8.1)

## 2022-08-21 LAB — AMMONIA: Ammonia: <10 umol/L (ref 9–35)

## 2022-08-21 LAB — LDL CHOLESTEROL, DIRECT: Direct LDL: 65 mg/dL (ref 0–99)

## 2022-08-21 LAB — HEMOGLOBIN A1C
Hgb A1c MFr Bld: 6 % — ABNORMAL HIGH (ref 4.8–5.6)
Mean Plasma Glucose: 125.5 mg/dL

## 2022-08-21 MED ORDER — LORAZEPAM 2 MG/ML IJ SOLN
1.0000 mg | Freq: Once | INTRAMUSCULAR | Status: AC
  Administered 2022-08-21: 1 mg via INTRAVENOUS
  Filled 2022-08-21: qty 1

## 2022-08-21 MED ORDER — IOHEXOL 350 MG/ML SOLN
75.0000 mL | Freq: Once | INTRAVENOUS | Status: AC | PRN
  Administered 2022-08-21: 75 mL via INTRAVENOUS

## 2022-08-21 MED ORDER — SODIUM CHLORIDE 0.9 % IV SOLN
4000.0000 mg | Freq: Once | INTRAVENOUS | Status: AC
  Administered 2022-08-21: 4000 mg via INTRAVENOUS
  Filled 2022-08-21: qty 40

## 2022-08-21 MED ORDER — SODIUM CHLORIDE 0.9 % IV SOLN: INTRAVENOUS | Status: DC

## 2022-08-21 MED ORDER — LEVETIRACETAM IN NACL 1000 MG/100ML IV SOLN
1000.0000 mg | Freq: Two times a day (BID) | INTRAVENOUS | Status: DC
  Administered 2022-08-22: 1000 mg via INTRAVENOUS
  Filled 2022-08-21: qty 100

## 2022-08-21 NOTE — Progress Notes (Signed)
OT Cancellation Note  Patient Details Name: Raymond Sampson MRN: [id number] DOB: 1941-02-01   Cancelled Treatment:    Reason Eval/Treat Not Completed: Patient at procedure or test/ unavailable.  OT will follow up and see as able.  Barry Brunner, OT Acute Rehabilitation Services Office [phone]   Chancy Milroy 08/21/2022, 9:50 AM

## 2022-08-21 NOTE — Procedures (Addendum)
Modified Barium Swallow Study  Patient Details  Name: ROBIN BABINEC MRN: [id number] Date of Birth: Nov 14, 1940  Today's Date: 08/21/2022  Modified Barium Swallow completed.  Full report located under Chart Review in the Imaging Section.  History of Present Illness 82 year old male who presented for fall. Hit forehead with laceration repair, no LOC, no facial fx, but CT 5/18 revealed: "stable large, approximately 17 mm thick Right Subdural Hematoma, most pronounced along the anterior right frontal convexity. Stable small volume right hemisphere [facility]."  Chest CT 5/17: "Emphysema. Right pleural effusion. Hazy upper lobe  airspace disease is indeterminate for edema or pneumonia." Neuro change noted by RN around 1300 on 5/19 with workup ongoing at present. Pt with history of CAD, prior stroke, hypertension, hyperlipidemia, type 2 diabetes, chronic antiplatelet use with aspirin and Plavix. MBS revealed hardware from ACDG   Clinical Impression Pt presents with a severe oropharyngeal dysphagia c/b lingual discoordination, delyaed swallow initiation, reduced base of tongue retraction, decreased hyolaryngeal elevation and excursion, partial epiglottic inversion, incomplete laryngeal closure, reduced UES opening, and diminished sensation.  These deficits resulted in silent aspiration of all consistencies.  All bolus trials administered by spoon.  There was reflexive cough on initial trial of thin liquid which did clear aspiration, but not on any additional trials.  There was weak, delayed throat clear following honey thick liquids which was ineffective to clear aspiraiton. UES opening and clearance was reduced, possibly 2/2 presence of cervical hardware. It appears pt has had ACDF in the past and lower portion of hardware was noted to protrude into upper esophagus with trace-mild contrast retention at this level, while upper portion of hardware encroached into hypopharynx with potential effects on epiglottic  inversion. Pharyngeal residue increased with viscosity.  Spontaenous secondary swallow was partially effective to clear residuals.  Pt did not follow instructions for cued swallow. Compensatory strategies could not be trialed today.  Pt has had a significant change to clinical presentation and was largely unable to follow directions during today's assessment. Dysphagia is expected to be acute on chronic with change in clinical presentation observed today, specifically with increased L facial droop; however, presence of hardware is not new.  Pt may have been able to compensate for this prior to current admission.    Pt is not safe to consume an oral diet at this time. Recommend pt be made NPO with alternative means of nutrition, hydration, and medication. Pt may have ice chips in moderation, after good oral care, when fully awake/alert, with upright positioning and 1:1 assistance with nursing and SLP only.    Factors that may increase risk of adverse event in presence of aspiration [name] 2021): Reduced cognitive function;Weak cough  Swallow Evaluation Recommendations Recommendations: NPO;Alternative means of nutrition - NG Tube Medication Administration: Via alternative means Oral care recommendations: Oral care QID (4x/day);Oral care before ice chips/water      Kerrie Pleasure, MA, CCC-SLP Acute Rehabilitation Services Office: [phone] 08/21/2022,10:22 AM

## 2022-08-21 NOTE — Progress Notes (Signed)
LTM EEG hooked up and running - no initial skin breakdown - push button tested - [facility] monitoring.  

## 2022-08-21 NOTE — TOC Initial Note (Signed)
Transition of Care [facility]) - Initial/Assessment Note    Patient Details  Name: Raymond Sampson MRN: [id number] Date of Birth: 02/15/1941  [facility]) CM/SW Contact:    Mearl Latin, LCSW Phone Number: 08/21/2022, 5:24 PM  Clinical Narrative:                 Patient is from home with spouse.Transitions of Care following for any TOC needs.     Barriers to Discharge: Continued Medical Work up   Patient Goals and CMS Choice            Expected Discharge Plan and Services       Living arrangements for the past 2 months: Single Family Home                                      Prior Living Arrangements/Services Living arrangements for the past 2 months: Single Family Home Lives with:: Spouse Patient language and need for interpreter reviewed:: Yes        Need for Family Participation in Patient Care: Yes (Comment) Care giver support system in place?: Yes (comment)   Criminal Activity/Legal Involvement Pertinent to Current Situation/Hospitalization: No - Comment as needed  Activities of Daily Living      Permission Sought/Granted                  Emotional Assessment         Alcohol / Substance Use: Not Applicable Psych Involvement: No (comment)  Admission diagnosis:  Subdural hematoma (HCC) [S06.5XAA] SDH (subdural hematoma) (HCC) [S06.5XAA] Abnormal EKG [R94.31] Facial laceration, initial encounter [S01.81XA] Patient Active Problem List   Diagnosis Date Noted   Subdural hematoma (HCC) 08/18/2022   Fall 06/22/2022   Malnutrition of moderate degree 06/22/2022   Acute blood loss anemia 06/21/2022   Multiple rib fractures involving four or more ribs, right side 06/20/2022   Intramuscular hematoma 06/19/2022   Rib fractures 06/19/2022   Hemothorax on right 06/19/2022   Leukocytosis 06/19/2022   Status post coronary artery stent placement 05/02/2022   Mild dementia (HCC) 04/14/2022   Non-ST elevation (NSTEMI) myocardial  infarction (HCC) 04/13/2022   History of COVID-19 04/13/2022   Lung nodule 04/13/2022   PAD (peripheral artery disease) (HCC) 04/13/2022   Flu 06/05/2017   Diabetes mellitus type 2, controlled, with complications (HCC) 04/06/2015   Carotid stenosis 05/14/2013   CAROTID BRUIT, LEFT 04/25/2010   PALPITATIONS 06/18/2009   Hyperlipidemia 04/27/2009   Essential hypertension 04/27/2009   Coronary artery disease of native artery of native heart with stable angina pectoris (HCC) 04/27/2009   PCP:  Gracelyn Nurse, MD Pharmacy:   [facility] [phone] - [place], [place] - [address] [place] [address] Phone: [phone] Fax: [phone]     Social Determinants of Health (SDOH) Social History: SDOH Screenings   Food Insecurity: No Food Insecurity (06/19/2022)  Housing: Low Risk  (06/19/2022)  Transportation Needs: No Transportation Needs (06/19/2022)  Utilities: Not At Risk (06/19/2022)  Depression (PHQ2-9): Low Risk  (05/17/2022)  Tobacco Use: Medium Risk (08/18/2022)   SDOH Interventions:     Readmission Risk Interventions     No data to display

## 2022-08-21 NOTE — Progress Notes (Signed)
PT Cancellation Note  Patient Details Name: Raymond Sampson MRN: [id number] DOB: [birth year]   Cancelled Treatment:    Reason Eval/Treat Not Completed: Patient at procedure or test/unavailable  Will reattempt as schedule permits.    Jerolyn Center, PT Acute Rehabilitation Services  Office [phone]  Zena Amos 08/21/2022, 9:52 AM

## 2022-08-21 NOTE — Consult Note (Addendum)
Neurology Consultation  Reason for Consult: possible strokes on MRI Referring Physician: Dr. Maurice Small  CC: none  History is obtained from:family and chart  HPI: Raymond Sampson is a 82 y.o. male with history of CAD, stroke, diabetes, hyperlipidemia, hypertension and paroxysmal atrial tachycardia who presented originally after falling off of 1 step off his deck at home and having a head injury with a large right-sided subdural hematoma.  He was initially alert and oriented and mentally at his baseline, but yesterday around 1300, he became more drowsy and was only able to intermittently follow commands and intermittently answer questions.  MRI brain performed yesterday afternoon shows stable right subdural hematoma with small volume of right subarachnoid hemorrhage and multiple small areas of restricted diffusion within bilateral frontal and parietal cortex is, possibly strokes or shear type injuries in the setting of trauma from fall.   LKW: Uncertain TNK given?: no, recent fall with head injury and large SDH IR Thrombectomy? No, no LVO and gradual onset of symptoms Modified Rankin Scale: 3-Moderate disability-requires help but walks WITHOUT assistance  ROS:  Unable to obtain due to altered mental status.   Past Medical History:  Diagnosis Date   Arthritic-like pain    CAD (coronary artery disease)    a. apical MI 5/07 with LHC showing 50% pCFX, 30% pRCA, 99% pLAD, 99% mLAD, 75% dLAD (cypher DES x3 to LAD); b. ETT-myoview (12/08) 81% MPHR,  EF 64%, partially reversible inferoapical perfusion defect similar to prior study 12/07; c. 04/2016 MV: EF 59%, fixed anteroapical defect, no ischemia->Low risk.   Carotid arterial disease (HCC)    a. 05/2016 Carotid U/S: 30-40% bilat dzs, f/u in 2 yrs.   Chest pain 04/13/2022   CVA (cerebral vascular accident) [facility])    a. 07/2010 - h/o CVA/TIA.   Diet-controlled type 2 diabetes mellitus (HCC)    HLD (hyperlipidemia)    HTN (hypertension)    PAT  (paroxysmal atrial tachycardia)    a. 06/2016 Event monitor:  rare episodes of atrial tachycardia, longest 7 beats. No afib.     Family History  Family history unknown: Yes     Social History:   reports that he quit smoking about 37 years ago. His smoking use included cigarettes. He has a 25.00 pack-year smoking history. He has never used smokeless tobacco. He reports that he does not drink alcohol and does not use drugs.  Medications  Current Facility-Administered Medications:    0.9 %  sodium chloride infusion, , Intravenous, Continuous, Ostergard, Clovis Pu, MD   [DISCONTINUED] acetaminophen (TYLENOL) tablet 650 mg, 650 mg, Oral, Q4H PRN, 650 mg at 08/20/22 2115 **OR** acetaminophen (TYLENOL) suppository 650 mg, 650 mg, Rectal, Q4H PRN, Cosentino, Talmadge Chad, PA-C   Chlorhexidine Gluconate Cloth 2 % PADS 6 each, 6 each, Topical, Daily, Ostergard, Clovis Pu, MD, 6 each at 08/21/22 1000   heparin injection 5,000 Units, 5,000 Units, Subcutaneous, Q8H, Cosentino, Talmadge Chad, PA-C, 5,000 Units at 08/21/22 1610   HYDROmorphone (DILAUDID) injection 0.5 mg, 0.5 mg, Intravenous, Q3H PRN, Cosentino, Allison R, PA-C   labetalol (NORMODYNE) injection 10-40 mg, 10-40 mg, Intravenous, Q10 min PRN, Iran Sizer, PA-C, 40 mg at 08/21/22 0603   mupirocin ointment (BACTROBAN) 2 % 1 Application, 1 Application, Nasal, BID, Jadene Pierini, MD, 1 Application at 08/21/22 1000   [DISCONTINUED] ondansetron (ZOFRAN) tablet 4 mg, 4 mg, Oral, Q4H PRN **OR** ondansetron (ZOFRAN) injection 4 mg, 4 mg, Intravenous, Q4H PRN, Iran Sizer, PA-C   Oral care mouth  rinse, 15 mL, Mouth Rinse, PRN, Jadene Pierini, MD   Oral care mouth rinse, 15 mL, Mouth Rinse, 4 times per day, Jadene Pierini, MD, 15 mL at 08/21/22 1200   Oral care mouth rinse, 15 mL, Mouth Rinse, PRN, Jadene Pierini, MD   Exam: Current vital signs: BP (!) 153/79   Pulse 84   Temp 100.1 F (37.8 C) (Axillary)   Resp 17    Ht 5\' 7"  (1.702 m)   Wt 64.6 kg   SpO2 98%   BMI 22.31 kg/m  Vital signs in last 24 hours: Temp:  [97.8 F (36.6 C)-101.6 F (38.7 C)] 100.1 F (37.8 C) (05/20 1200) Pulse Rate:  [77-111] 84 (05/20 1200) Resp:  [12-25] 17 (05/20 1200) BP: (140-182)/(63-95) 153/79 (05/20 1200) SpO2:  [94 %-100 %] 98 % (05/20 1200)  GENERAL: Drowsy, ill-appearing patient with bandage on left forehead Psych: Affect appropriate for situation, patient is calm and cooperative with examination Head: Contusions to left side of face and orbit, bandage in place EENT: Normal conjunctivae, dry mucous membranes, no OP obstruction LUNGS: Normal respiratory effort. Non-labored breathing on room air CV: Regular rate and rhythm on telemetry Extremities: warm, well perfused, without bruising to bilateral arms  NEURO:  Mental Status: Drowsy but will respond to questions and state name.  Will intermittently answer questions and can intermittently mimic or follow commands. He is not able to provide a clear and coherent history of present illness. Speech/Language: speech is in short words or phrases and is hypophonic No neglect is noted Cranial Nerves:  II: PERRL 4 mm/brisk.   III, IV, VI: Right gaze preference, unable to cross midline VII: Questionable left facial droop VIII: Hearing intact to voice IX, X: Voice is hypophonic XII: Noncooperative with tongue protrusion Motor: Good antigravity strength in right upper extremity, will not move left upper extremity, moves bilateral lower extremities to pressure of bottom of feet Tone is normal. Bulk is normal.  Sensation: Appears intact to light touch throughout Coordination: Unable to perform Gait: Deferred  NIHSS: 1a Level of Conscious.: 1 1b LOC Questions: 2 1c LOC Commands: 2 2 Best Gaze: 1 3 Visual: 0 4 Facial Palsy: 1 5a Motor Arm - left: 4 5b Motor Arm - Right: 2 6a Motor Leg - Left: 3 6b Motor Leg - Right: 3 7 Limb Ataxia: 0 8 Sensory: 0 9 Best  Language: 0 10 Dysarthria: 1 11 Extinct. and Inatten.: 0 TOTAL: 20   Labs I have reviewed labs in epic and the results pertinent to this consultation are:   CBC    Component Value Date/Time   WBC 7.3 08/18/2022 1612   RBC 3.30 (L) 08/18/2022 1612   HGB 9.9 (L) 08/18/2022 1612   HGB 13.4 05/29/2019 1113   HCT 31.3 (L) 08/18/2022 1612   HCT 38.9 05/29/2019 1113   PLT 297 08/18/2022 1612   PLT 259 05/29/2019 1113   MCV 94.8 08/18/2022 1612   MCV 89 05/29/2019 1113   MCV 88 12/27/2012 1721   MCH 30.0 08/18/2022 1612   MCHC 31.6 08/18/2022 1612   RDW 15.1 08/18/2022 1612   RDW 12.4 05/29/2019 1113   RDW 13.6 12/27/2012 1721   LYMPHSABS 1.4 08/18/2022 1612   MONOABS 0.7 08/18/2022 1612   EOSABS 0.3 08/18/2022 1612   BASOSABS 0.0 08/18/2022 1612    CMP     Component Value Date/Time   NA 137 08/18/2022 1612   NA 135 (L) 12/27/2012 1721   K 3.9 08/18/2022  1612   K 4.0 12/27/2012 1721   CL 99 08/18/2022 1612   CL 101 12/27/2012 1721   CO2 25 08/18/2022 1612   CO2 29 12/27/2012 1721   GLUCOSE 148 (H) 08/18/2022 1612   GLUCOSE 107 (H) 12/27/2012 1721   BUN 17 08/18/2022 1612   BUN 15 12/27/2012 1721   CREATININE 1.03 08/18/2022 1612   CREATININE 1.00 12/27/2012 1721   CALCIUM 8.6 (L) 08/18/2022 1612   CALCIUM 9.6 12/27/2012 1721   PROT 6.5 06/20/2022 0425   ALBUMIN 3.4 (L) 06/20/2022 0425   AST 31 06/20/2022 0425   ALT 21 06/20/2022 0425   ALKPHOS 62 06/20/2022 0425   BILITOT 0.9 06/20/2022 0425   GFRNONAA >60 08/18/2022 1612   GFRNONAA >60 12/27/2012 1721   GFRAA >60 06/06/2017 0314   GFRAA >60 12/27/2012 1721    Lipid Panel     Component Value Date/Time   CHOL 205 (H) 04/13/2022 0430   CHOL 132 05/29/2019 1113   CHOL 138 12/28/2012 0506   TRIG 257 (H) 04/13/2022 0430   TRIG 197 12/28/2012 0506   TRIG 111 08/06/2008 0000   HDL 40 (L) 04/13/2022 0430   HDL 34 (L) 05/29/2019 1113   HDL 34 (L) 12/28/2012 0506   CHOLHDL 5.1 04/13/2022 0430   VLDL 51 (H)  04/13/2022 0430   VLDL 39 12/28/2012 0506   LDLCALC 114 (H) 04/13/2022 0430   LDLCALC 64 05/29/2019 1113   LDLCALC 65 12/28/2012 0506     Imaging I have reviewed the images obtained:  CT-scan of the brain 5/19 stable mixed density right subdural hematoma measuring up to 16 mm, new left extra-axial CSF density collection measuring to 5 mm, likely a subdural hygroma  MRI examination of the brain: Stable right subdural hematoma and left CSF subdural collection with similar mass effect, small volume SAH along right convexity, multiple small punctate areas of restricted diffusion within bilateral parietal cortex suspicious for tiny acute infarcts or shear type injuries in the setting of trauma  Assessment: 82 year old patient with history of CAD, stroke, diabetes, hyperlipidemia, hypertension and paroxysmal atrial tachycardia presented originally after a fall with a large right-sided subdural hematoma.  He was initially alert and oriented and in a normal mental status, but yesterday afternoon, he became more lethargic and was only able to intermittently answer questions and follow commands.  He also gradually stopped moving the left upper extremity, although he moves the right upper extremity with antigravity strength and withdraws bilateral lower extremities to plantar surface brush.  Patient is noted today to have new right gaze deviation.  Will obtain EEG out of concerns for seizure activity in setting of large subdural hematoma.    Impression: DWI enhancing lesions due to scattered acute infarcts vs shear injury in setting of recent trauma, possible seizure activity in patient with large subdural hematoma  Update: LTM EEG personally reviewed, patient had 5 electrographic (nonconvulsive) seizures on the R in the first 30 min of recording. See updated plan below. Recommend holding off on further GOC discussions in the setting of ongoing acute nonconvulsive seizures. Hopefully we can get these under  control then have a better idea of what his actual function is.  Recommendations:  # R hemispheric electrographic status epilepticus - 60mg /kg keppra - 1mg  IV ativan (too lethargic to give more safely) - Continue LTM  # Restricted diffusion on MRI brain, possible acute ischemic infarcts vs shear injury  - Holding plavix x1 wk per neurosurgery - No permissive HTN in  setting of acute subdural, avoid hypotension - CTA H&N - TTE - Check A1c and LDL + add statin per guidelines - q4 hr neuro checks - STAT head CT for any change in neuro exam - Tele - PT/OT/SLP - Stroke education - Amb referral to neurology upon discharge   Note written by Harvest Dark NP and edited later by MD.   Attending Neurohospitalist Addendum Patient seen and examined with APP/Resident. Agree with the history and physical as documented above. Agree with the plan as documented, which I helped formulate. I have edited the note above to reflect my full findings and recommendations. I have independently reviewed the chart, obtained history, review of systems and examined the patient.I have personally reviewed pertinent head/neck/spine imaging (CT/MRI). Please feel free to call with any questions.  This patient is critically ill and at significant risk of neurological worsening, death and care requires constant monitoring of vital signs, hemodynamics,respiratory and cardiac monitoring, neurological assessment, discussion with family, other specialists and medical decision making of high complexity. I spent 90 minutes of neurocritical care time  in the care of  this patient. This was time spent independent of any time provided by nurse practitioner or PA.  Bing Neighbors, MD [facility] [phone]  If 7pm- 7am, please page neurology on call as listed in AMION.

## 2022-08-21 NOTE — Progress Notes (Signed)
Neurosurgery Service Progress Note  Subjective: New L facial droop and LUE weakness overnight, MRI showed diffuse infarcts   Objective: Vitals:   08/21/22 0600 08/21/22 0615 08/21/22 0700 08/21/22 0800  BP: (!) 182/89 (!) 141/69 (!) 150/66 (!) 141/73  Pulse: 82 81 80 81  Resp: (!) 25 20 17  (!) 23  Temp:    100.3 F (37.9 C)  TempSrc:    Axillary  SpO2: 94% 96% 95% 98%  Weight:      Height:        Physical Exam: Somnolent, eyes open to stim, pupils OD 6mm reactive OS 4mm reactive, gaze conjugate, oriented x zero and likely globally aphasic, moves RUE/RLE to stim, LUE minimal movement, LLE with minimal movement, +L UMN pattern facial droop  Assessment & Plan: 82 y.o. man on plavix w/ SDH and new L sided weakness, MRI w/ diffuse bilateral small supratentorial strokes.   -stroke consult -failed swallow eval, will start IVF, discussed w/ family that he'll need dobhoff or have pleasure feeds / aspiration risk -transfer out of the unit  Raymond Sampson  08/21/22 11:48 AM

## 2022-08-22 ENCOUNTER — Inpatient Hospital Stay (HOSPITAL_COMMUNITY): Payer: PPO

## 2022-08-22 DIAGNOSIS — I619 Nontraumatic intracerebral hemorrhage, unspecified: Secondary | ICD-10-CM | POA: Diagnosis not present

## 2022-08-22 DIAGNOSIS — I6389 Other cerebral infarction: Secondary | ICD-10-CM

## 2022-08-22 DIAGNOSIS — R569 Unspecified convulsions: Secondary | ICD-10-CM | POA: Diagnosis not present

## 2022-08-22 DIAGNOSIS — S065XAA Traumatic subdural hemorrhage with loss of consciousness status unknown, initial encounter: Secondary | ICD-10-CM | POA: Diagnosis not present

## 2022-08-22 LAB — ECHOCARDIOGRAM COMPLETE BUBBLE STUDY
Area-P 1/2: 2.86 cm2
Calc EF: 60.1 %
S' Lateral: 3 cm
Single Plane A2C EF: 58.1 %
Single Plane A4C EF: 62.3 %

## 2022-08-22 LAB — CALCIUM, IONIZED: Calcium, Ionized, Serum: 5 mg/dL (ref 4.5–5.6)

## 2022-08-22 MED ORDER — PERFLUTREN LIPID MICROSPHERE
1.0000 mL | INTRAVENOUS | Status: AC | PRN
  Administered 2022-08-22: 5 mL via INTRAVENOUS

## 2022-08-22 MED ORDER — LORAZEPAM 2 MG/ML IJ SOLN
INTRAMUSCULAR | Status: AC
  Administered 2022-08-22: 2 mg
  Filled 2022-08-22: qty 1

## 2022-08-22 MED ORDER — SODIUM CHLORIDE 0.9 % IV SOLN
100.0000 mg | Freq: Two times a day (BID) | INTRAVENOUS | Status: DC
  Administered 2022-08-22 – 2022-09-05 (×28): 100 mg via INTRAVENOUS
  Filled 2022-08-22 (×29): qty 10

## 2022-08-22 MED ORDER — SODIUM CHLORIDE 0.9 % IV SOLN
400.0000 mg | INTRAVENOUS | Status: AC
  Administered 2022-08-22: 400 mg via INTRAVENOUS
  Filled 2022-08-22: qty 40

## 2022-08-22 MED ORDER — LEVETIRACETAM IN NACL 1500 MG/100ML IV SOLN
1500.0000 mg | Freq: Two times a day (BID) | INTRAVENOUS | Status: DC
  Administered 2022-08-22 – 2022-08-24 (×4): 1500 mg via INTRAVENOUS
  Filled 2022-08-22 (×4): qty 100

## 2022-08-22 NOTE — Procedures (Addendum)
Patient Name: Raymond Sampson  MRN: [id number]  Epilepsy Attending: Charlsie Quest  Referring Physician/Provider: Jefferson Fuel, MD  Duration: 08/21/2019 1750 to 08/22/2022 1750  Patient history: 82 y.o. male with history of CAD, stroke, diabetes, hyperlipidemia, hypertension and paroxysmal atrial tachycardia who presented originally after falling off of 1 step off his deck at home and having a head injury with a large right-sided subdural hematoma.  He was initially alert and oriented and mentally at his baseline, but yesterday around 1300, he became more drowsy and was only able to intermittently follow commands and intermittently answer questions. EEG to evaluate for seizure.  Level of alertness: Awake, asleep  AEDs during EEG study: LEV  Technical aspects: This EEG study was done with scalp electrodes positioned according to the 10-20 International system of electrode placement. Electrical activity was reviewed with band pass filter of 1-70Hz , sensitivity of 7 uV/mm, display speed of 50mm/sec with a 60Hz  notched filter applied as appropriate. EEG data were recorded continuously and digitally stored.  Video monitoring was available and reviewed as appropriate.  Description: No clear posterior dominant rhythm was seen. Sleep was characterized by vertex waves, sleep spindles (12 to 14 Hz), maximal frontocentral region.  EEG showed continuous generalized and lateralized right hemisphere 3-7 theta-delta slowing.  Seizure without clinical signs were noted arising from right centro-parietal region. Thirteen seizures were noted between 1825 to 1949, lasting about 30 seconds to 3 minutes.  Patient was loaded with Keppra after which seizure resolved briefly.  However, seizures recurred on 08/22/2022 at around  0315.  Subsequently, EEG showed average 6 seizures per hour lasting about 30 seconds to 1 minute.  During seizure, EEG showed sharp waves in left central parietal region which increased in  frequency to 3 Hz and involved all of right hemisphere as well as vertex region and then evolved into 2 to 3 Hz delta slowing with overriding sharply contoured delta 14 Hz beta activity. Patient was loaded with Vimpat at around 1139. No further seizures were noted subsequently.   Hyperventilation and photic stimulation were not performed.     ABNORMALITY -Seizure without clinical signs,  right centro-parietal region. -Continuous slow, generalized and lateralized right hemisphere  IMPRESSION: This study initially showed seizures without clinical signs arising from right centro-parietal region. Thirteen seizures were noted between 1825 to 1949, lasting about 30 seconds to 3 minutes.  Patient was loaded with Keppra after which seizure resolved briefly.  However, seizures recurred on 08/22/2022 at around  0315.  Subsequently, EEG showed average 6 seizures per hour lasting about 30 seconds to 1 minute. Patient was loaded with Vimpat at around 1139. No further seizures were noted subsequently.   Additionally, there is evidence of cortical dysfunction in right hemisphere likely secondary to underlying structural abnormality.  Lastly there is moderate diffuse encephalopathy.      Raymond Sampson

## 2022-08-22 NOTE — Evaluation (Signed)
Occupational Therapy Evaluation Patient Details Name: Raymond Sampson MRN: [id number] DOB: 1940-09-17 Today's Date: 08/22/2022   History of Present Illness 82 year old male admitted 08/18/22 after fall at home. CT head 5/18 revealed: large Right Subdural Hematoma, most pronounced along the anterior right frontal convexity.  Developed increased lethargy and change in pupilary size on 5/16 and MRI sowed multiple punctate areas of restricted diffusion in bilateral frontal and parietal cortex suspicious for tiny acute infarcts. LTM EEG positive for nonconvulsive seizures. Pt with history of CAD, prior stroke, hypertension, hyperlipidemia, type 2 diabetes, prior fall in March this year with SNF stay, chronic antiplatelet use with aspirin and Plavix.   Clinical Impression   PTA patient at home, spouse reports independent with ADLs and using RW vs no AD for mobility. Admitted for above and presents with problem list below. Limited session due to arousal, but repositioned into chair position with total assist. Pt not opening his eyes, but raising his eyebrows and moving extremities (minimally) to simulation. He did resist movement of both UEs with HOH of washcloth to face, and demonstrated grasp in both hands.Based on performance today, recommend continued OT services acutely and after dc at inpatient setting with <3hrs/day to progress towards baseline and decrease burden of care.      Recommendations for follow up therapy are one component of a multi-disciplinary discharge planning process, led by the attending physician.  Recommendations may be updated based on patient status, additional functional criteria and insurance authorization.   Assistance Recommended at Discharge Frequent or constant Supervision/Assistance  Patient can return home with the following Two people to help with walking and/or transfers;Two people to help with bathing/dressing/bathroom;Help with stairs or ramp for entrance;Assist for  transportation;Direct supervision/assist for financial management;Assistance with feeding;Direct supervision/assist for medications management;Assistance with cooking/housework    Functional Status Assessment  Patient has had a recent decline in their functional status and demonstrates the ability to make significant improvements in function in a reasonable and predictable amount of time.  Equipment Recommendations  Other (comment) (defer)    Recommendations for Other Services       Precautions / Restrictions Precautions Precautions: Fall Precaution Comments: Seizures, NPO, LTM EEG Restrictions Weight Bearing Restrictions: No      Mobility Bed Mobility Overal bed mobility: Needs Assistance Bed Mobility: Supine to Sit           General bed mobility comments: dependent for up to chair position in bed    Transfers                   General transfer comment: NT due to arousal      Balance Overall balance assessment: Needs assistance   Sitting balance-Leahy Scale: Zero Sitting balance - Comments: supported in chair position in bed; total A +2 to pull forward off bed in attempt to stimulate arousal                                   ADL either performed or assessed with clinical judgement   ADL Overall ADL's : Needs assistance/impaired                                       General ADL Comments: total care     Vision   Additional Comments: eyes closed during session, does not open  Perception     Praxis      Pertinent Vitals/Pain Pain Assessment Pain Assessment: CPOT Facial Expression: Relaxed, neutral Body Movements: Absence of movements Muscle Tension: Relaxed Compliance with ventilator (intubated pts.): N/A Vocalization (extubated pts.): Talking in normal tone or no sound CPOT Total: 0 Pain Intervention(s): Monitored during session     Hand Dominance Right   Extremity/Trunk Assessment Upper Extremity  Assessment Upper Extremity Assessment: RUE deficits/detail;LUE deficits/detail RUE Deficits / Details: some movement noted with stimulation but diffcult to assess  strength.  He was unable to hold arm elevated, but was able to grasp and turn hand at forearm. Was resisting movement of cold washcloth to face with hand over hand. LUE Deficits / Details: some movement noted with stimulation but diffcult to assess strength. He was unable to hold arm elevated, but was able to grasp at hand. Was resisting movement of cold washcloth to face with hand over hand.   Lower Extremity Assessment Lower Extremity Assessment: Defer to PT evaluation RLE Deficits / Details: some active movement noted with vigorous stimulation but none to command and unable to determine if weaker on one side LLE Deficits / Details: some active movement noted with vigorous stimulation but none to command and unable to determine if weaker on one side       Communication Communication Communication: Other (comment) (not verbalizing during session)   Cognition Arousal/Alertness: Lethargic Behavior During Therapy: Flat affect Overall Cognitive Status: Difficult to assess                                 General Comments: some movements to vigorous stimulation with sternal rub, nailbed pressure and trapezius pressure, but no eye opening     General Comments  wife present and supportive and providing all history, VSS, pt on LTM EEG, had forehead laceration that was repaired in ED    Exercises     Shoulder Instructions      Home Living Family/patient expects to be discharged to:: Private residence Living Arrangements: Spouse/significant other Available Help at Discharge: Family;Available 24 hours/day Type of Home: House Home Access: Stairs to enter [employer] of Steps: 3 Entrance Stairs-Rails: Right;Left Home Layout: One level     Bathroom Shower/Tub: Chief Strategy Officer:  Handicapped height     Home Equipment: Agricultural consultant (2 wheels);Tub bench;Grab bars - tub/shower;BSC/3in1   Additional Comments: 2 dogs      Prior Functioning/Environment Prior Level of Function : History of Falls (last six months);Independent/Modified Independent             Mobility Comments: was using RW for outdoor mobility, walker intermittently in house as needed ADLs Comments: independent with ADLs        OT Problem List: Decreased strength;Decreased range of motion;Decreased activity tolerance;Impaired balance (sitting and/or standing);Impaired vision/perception;Decreased coordination;Decreased cognition;Decreased safety awareness;Decreased knowledge of use of DME or AE;Decreased knowledge of precautions;Cardiopulmonary status limiting activity;Impaired sensation;Impaired UE functional use      OT Treatment/Interventions: Self-care/ADL training;Therapeutic exercise;DME and/or AE instruction;Neuromuscular education;Therapeutic activities;Cognitive remediation/compensation;Visual/perceptual remediation/compensation;Patient/family education;Balance training    OT Goals(Current goals can be found in the care plan section) Acute Rehab OT Goals Patient Stated Goal: get better OT Goal Formulation: With family Time For Goal Achievement: 09/05/22 Potential to Achieve Goals: Fair  OT Frequency: Min 2X/week    Co-evaluation PT/OT/SLP Co-Evaluation/Treatment: Yes Reason for Co-Treatment: Necessary to address cognition/behavior during functional activity   OT goals addressed during  session: ADL's and self-care      AM-PAC OT "6 Clicks" Daily Activity     Outcome Measure Help from another person eating meals?: Total Help from another person taking care of personal grooming?: Total Help from another person toileting, which includes using toliet, bedpan, or urinal?: Total Help from another person bathing (including washing, rinsing, drying)?: Total Help from another person to  put on and taking off regular upper body clothing?: Total Help from another person to put on and taking off regular lower body clothing?: Total 6 Click Score: 6   End of Session Nurse Communication: Mobility status  Activity Tolerance: Patient limited by lethargy Patient left: in bed;with call bell/phone within reach;with bed alarm set;with family/visitor present;with SCD's reapplied  OT Visit Diagnosis: Other abnormalities of gait and mobility (R26.89);Muscle weakness (generalized) (M62.81);Other symptoms and signs involving cognitive function;Other symptoms and signs involving the nervous system (R29.898)                Time: 1610-9604 OT Time Calculation (min): 30 min Charges:  OT General Charges $OT Visit: 1 Visit OT Evaluation $OT Eval Moderate Complexity: 1 Mod  Barry Brunner, OT Acute Rehabilitation Services Office [phone]   Chancy Milroy 08/22/2022, 1:34 PM

## 2022-08-22 NOTE — Progress Notes (Signed)
LTM maint complete - no skin breakdown  Serviced EEG's ecg leads [facility] monitored, Event button test confirmed by [facility].  

## 2022-08-22 NOTE — Progress Notes (Signed)
Echocardiogram 2D Echocardiogram has been performed.  Toni Amend 08/22/2022, 10:37 AM

## 2022-08-22 NOTE — Evaluation (Signed)
Physical Therapy Evaluation Patient Details Name: Raymond Sampson MRN: [id number] DOB: 1940-04-19 Today's Date: 08/22/2022  History of Present Illness  82 year old male admitted 08/18/22 after fall at home. CT head 5/18 revealed: large Right Subdural Hematoma, most pronounced along the anterior right frontal convexity.  Developed increased lethargy and change in pupilary size on 5/16 and MRI sowed multiple punctate areas of restricted diffusion in bilateral frontal and parietal cortex suspicious for tiny acute infarcts. LTM EEG positive for nonconvulsive seizures. Pt with history of CAD, prior stroke, hypertension, hyperlipidemia, type 2 diabetes, prior fall in March this year with SNF stay, chronic antiplatelet use with aspirin and Plavix.  Clinical Impression  Patient presents with decreased mobility due to decreased arousal, attention and awareness, decreased strength, balance and activity tolerance.  Currently not opening his eyes despite vigorous stimulation and still on LTM EEG.  Wife present and supportive.  Pt moving all 4 extremities to some extent with stimulation though unable to fully assess any focal weakness.  Previously pt ambulatory in the home furniture walking and "supposed" to use walker outside.  Wife reports was at [facility] after last fall and prefers this facility due to proximity.  PT will continue to follow in acute setting.        Recommendations for follow up therapy are one component of a multi-disciplinary discharge planning process, led by the attending physician.  Recommendations may be updated based on patient status, additional functional criteria and insurance authorization.  Follow Up Recommendations Can patient physically be transported by private vehicle: No     Assistance Recommended at Discharge Frequent or constant Supervision/Assistance  Patient can return home with the following  Two people to help with walking and/or transfers;Two people to help with  bathing/dressing/bathroom;Assist for transportation;Help with stairs or ramp for entrance    Equipment Recommendations None recommended by PT  Recommendations for Other Services       Functional Status Assessment Patient has had a recent decline in their functional status and/or demonstrates limited ability to make significant improvements in function in a reasonable and predictable amount of time     Precautions / Restrictions Precautions Precautions: Fall Precaution Comments: Seizures, NPO      Mobility  Bed Mobility Overal bed mobility: Needs Assistance Bed Mobility: Supine to Sit           General bed mobility comments: dependent for up to chair position in bed    Transfers                   General transfer comment: NT due to arousal    Ambulation/Gait                  Stairs            Wheelchair Mobility    Modified Rankin (Stroke Patients Only) Modified Rankin (Stroke Patients Only) Pre-Morbid Rankin Score: Moderate disability Modified Rankin: Severe disability     Balance Overall balance assessment: Needs assistance   Sitting balance-Leahy Scale: Zero Sitting balance - Comments: supported in chair position in bed; total A +2 to pull forward off bed in attempt to stimulate arousal                                     Pertinent Vitals/Pain Pain Assessment Pain Assessment: CPOT Facial Expression: Relaxed, neutral Body Movements: Absence of movements Muscle Tension: Relaxed Compliance with ventilator (intubated pts.):  N/A Vocalization (extubated pts.): Talking in normal tone or no sound CPOT Total: 0 Pain Intervention(s): Monitored during session    Home Living Family/patient expects to be discharged to:: Private residence Living Arrangements: Spouse/significant other Available Help at Discharge: Family;Available 24 hours/day Type of Home: House Home Access: Stairs to enter Entrance Stairs-Rails:  Doctor, general practice of Steps: 3   Home Layout: One level Home Equipment: Agricultural consultant (2 wheels);Tub bench;Grab bars - tub/shower;BSC/3in1 Additional Comments: 2 dogs    Prior Function Prior Level of Function : History of Falls (last six months);Independent/Modified Independent             Mobility Comments: was using RW for outdoor mobility, walker intermittently in house as needed ADLs Comments: independent with ADLs     Hand Dominance   Dominant Hand: Right    Extremity/Trunk Assessment   Upper Extremity Assessment Upper Extremity Assessment: Defer to OT evaluation    Lower Extremity Assessment Lower Extremity Assessment: LLE deficits/detail;RLE deficits/detail RLE Deficits / Details: some active movement noted with vigorous stimulation but none to command and unable to determine if weaker on one side LLE Deficits / Details: some active movement noted with vigorous stimulation but none to command and unable to determine if weaker on one side       Communication   Communication: Other (comment) (not verbalizing during session)  Cognition Arousal/Alertness: Lethargic Behavior During Therapy: Flat affect Overall Cognitive Status: Difficult to assess                                 General Comments: some movements to vigorous stimulation with sternal rub, nailbed pressure and trapezius pressure, but no eye opening        General Comments General comments (skin integrity, edema, etc.): wife present and supportive and providing all history, VSS, pt on LTM EEG, had forehead laceration that was repaired in ED    Exercises     Assessment/Plan    PT Assessment Patient needs continued PT services  PT Problem List Decreased strength;Decreased cognition;Decreased mobility;Decreased balance;Decreased activity tolerance;Decreased safety awareness       PT Treatment Interventions DME instruction;Functional mobility training;Balance  training;Patient/family education;Therapeutic activities;Gait training;Therapeutic exercise;Cognitive remediation;Neuromuscular re-education    PT Goals (Current goals can be found in the Care Plan section)  Acute Rehab PT Goals Patient Stated Goal: per wife to help with arousal PT Goal Formulation: With family Time For Goal Achievement: 09/05/22 Potential to Achieve Goals: Fair    Frequency Min 3X/week     Co-evaluation PT/OT/SLP Co-Evaluation/Treatment: Yes Reason for Co-Treatment: Necessary to address cognition/behavior during functional activity           AM-PAC PT "6 Clicks" Mobility  Outcome Measure Help needed turning from your back to your side while in a flat bed without using bedrails?: Total Help needed moving from lying on your back to sitting on the side of a flat bed without using bedrails?: Total Help needed moving to and from a bed to a chair (including a wheelchair)?: Total Help needed standing up from a chair using your arms (e.g., wheelchair or bedside chair)?: Total Help needed to walk in hospital room?: Total Help needed climbing 3-5 steps with a railing? : Total 6 Click Score: 6    End of Session   Activity Tolerance: Patient limited by lethargy Patient left: in bed;with family/visitor present   PT Visit Diagnosis: Other symptoms and signs involving the nervous system (R29.898);Muscle  weakness (generalized) (M62.81);Other abnormalities of gait and mobility (R26.89)    Time: 1610-9604 PT Time Calculation (min) (ACUTE ONLY): 30 min   Charges:   PT Evaluation $PT Eval Moderate Complexity: 1 Mod          Cyndi Asha Grumbine, PT Acute Rehabilitation Services [phone] 08/22/2022   Elray Mcgregor 08/22/2022, 1:00 PM

## 2022-08-22 NOTE — Progress Notes (Signed)
SLP Cancellation Note  Patient Details Name: Raymond Sampson MRN: [id number] DOB: [birth year]   Cancelled treatment:        Pt continues with decreased LOA.  New evidence of seizures found.  SLP will defer dysphagia treatment at this time to allow for seizure activity to be controlled.  Spoke with RN. Palliative meeting in progress at time of attempt as well. SLP will continue to follow and reevaluate as indicated.    Kerrie Pleasure, MA, CCC-SLP Acute Rehabilitation Services Office: [phone]  08/22/2022, 12:46 PM

## 2022-08-22 NOTE — Progress Notes (Signed)
   NAME:  Raymond Sampson, MRN:  [id number], DOB:  Feb 18, 1941, LOS: 4 ADMISSION DATE:  08/18/2022, CONSULTATION DATE: 08/22/2022 REFERRING MD: Charlynn Grimes, MD, CHIEF COMPLAINT: Status epilepticus.  History of Present Illness:  82 year old man who presented with traumatic subdural hematoma.  He was initially minimally symptomatic and refused surgery. Unfortunately since then he has developed intermittent focal seizures.  Pertinent  Medical History   Past Medical History:  Diagnosis Date   Arthritic-like pain    CAD (coronary artery disease)    a. apical MI 5/07 with LHC showing 50% pCFX, 30% pRCA, 99% pLAD, 99% mLAD, 75% dLAD (cypher DES x3 to LAD); b. ETT-myoview (12/08) 81% MPHR,  EF 64%, partially reversible inferoapical perfusion defect similar to prior study 12/07; c. 04/2016 MV: EF 59%, fixed anteroapical defect, no ischemia->Low risk.   Carotid arterial disease (HCC)    a. 05/2016 Carotid U/S: 30-40% bilat dzs, f/u in 2 yrs.   Chest pain 04/13/2022   CVA (cerebral vascular accident) [facility])    a. 07/2010 - h/o CVA/TIA.   Diet-controlled type 2 diabetes mellitus (HCC)    HLD (hyperlipidemia)    HTN (hypertension)    PAT (paroxysmal atrial tachycardia)    a. 06/2016 Event monitor:  rare episodes of atrial tachycardia, longest 7 beats. No afib.   Significant Hospital Events: Including procedures, antibiotic start and stop dates in addition to other pertinent events   5/21 -intermittent seizure activity by EEG  Interim History / Subjective:  Patient lying in bed.  Periods of intermittent unresponsiveness although he was able to get up to chair with physical therapy earlier on.  Objective   Blood pressure (!) 114/54, pulse 72, temperature 97.8 F (36.6 C), temperature source Axillary, resp. rate 18, height 5\' 7"  (1.702 m), weight 64.6 kg, SpO2 (!) 89 %.        Intake/Output Summary (Last 24 hours) at 08/22/2022 1652 Last data filed at 08/22/2022 0800 Gross per 24 hour  Intake  1428.08 ml  Output 850 ml  Net 578.08 ml   Filed Weights   08/18/22 1623 08/18/22 2345  Weight: 65.2 kg 64.6 kg    Examination: General: Frail-appearing. HENT: Dry oral mucosa Lungs: Clear Cardiovascular: Normal Abdomen: Soft nontender Extremities: Muscle wasting Neuro: Unresponsive GU: Condom catheter  Ancillary tests personally reviewed  Large frontal right acute subdural hematoma.  Assessment & Plan:  Acute subdural hematoma Recurrent focal seizures Coronary artery disease Type 2 diabetes Prior stroke Hypertension  Plan:  -Given overall frailty and age reasonable to forego hematoma evacuation. -Current seizures are likely secondary to irritation from subdural hematoma although evacuation may not fully control seizures. -Seizure control can be reasonably seen as being potentially benefit in a palliative care strategy. -Continue levetiracetam and add lacosamide.  If seizures stopped and transition to oral and proceed with rehabilitation. -Seizures become intractable make need to add additional medications for comfort phenobarbital may have dual benefits in terms of providing sedation as well as seizure control.  Best Practice (right click and "Reselect all SmartList Selections" daily)   Diet/type: NPO DVT prophylaxis: SCD GI prophylaxis: N/A Lines: N/A Foley:  N/A Code Status:  DNR Last date of multidisciplinary goals of care discussion [family updated bedside.  Lynnell Catalan, MD [facility] ICU Physician [facility]  Pager: [phone] Or Epic Secure Chat After hours: [phone].  08/22/2022, 5:07 PM

## 2022-08-22 NOTE — Progress Notes (Signed)
Neurosurgery Service Progress Note  Subjective: More somnolent / lethargic overnight. EEG shows status epilepticus.   Objective: Vitals:   08/22/22 0600 08/22/22 0700 08/22/22 0800 08/22/22 0900  BP: (!) 129/59 (!) 117/58 (!) 131/54 (!) 135/59  Pulse: 79 72 69 82  Resp: (!) 22 16 (!) 31 17  Temp:   98.5 F (36.9 C)   TempSrc:   Axillary   SpO2: 92% 91% 98% (!) 85%  Weight:      Height:        Physical Exam: Somnolent, does not open eyes or respond to painful stimuli, pupils OD 6mm reactive OS 4mm reactive, gaze conjugate, bilateral Babinski reflex   Assessment & Plan: 82 y.o. male on Plavix w/ SDH now with small supratentorial strokes and in status epilepticus.   -discussed case with PCCM who will take over the care of this patient given that he refused any surgical management prior to his decline in function  -neurology and palliative care consulted yesterday, continue with management per their recommendations  -hold clopidrogel x1 week   Emilee Hero, PA-C 08/22/22 9:53 AM

## 2022-08-22 NOTE — Progress Notes (Signed)
Subjective: Seizures improved briefly after Keppra load yesterday.  However then recurred.  ROS: Unable to obtain due to poor mental status  Examination  Vital signs in last 24 hours: Temp:  [97.8 F (36.6 C)-101 F (38.3 C)] 97.8 F (36.6 C) (05/21 1200) Pulse Rate:  [65-88] 68 (05/21 1100) Resp:  [13-31] 15 (05/21 1100) BP: (98-169)/(53-81) 98/53 (05/21 1100) SpO2:  [85 %-100 %] 100 % (05/21 1100)  General: lying in bed, NAD Neuro: Barely opens eyes to repeated tactile and verbal stimulation, did not answer orientation questions, did squeeze hand on the right side but not consistently, did not follow any other commands, PERRLA, no forced gaze deviation, does not track examiner, does not blink to threat, spontaneously moving right upper extremity with antigravity strength, 1/5 in left upper extremity, withdraws to noxious stimuli in bilateral lower extremities  Basic Metabolic Panel: Recent Labs  Lab 08/18/22 1612 08/21/22 1433  NA 137 132*  K 3.9 4.5  CL 99 97*  CO2 25 27  GLUCOSE 148* 204*  BUN 17 26*  CREATININE 1.03 1.15  CALCIUM 8.6* 8.6*    CBC: Recent Labs  Lab 08/18/22 1612  WBC 7.3  NEUTROABS 4.9  HGB 9.9*  HCT 31.3*  MCV 94.8  PLT 297     Coagulation Studies: No results for input(s): "LABPROT", "INR" in the last 72 hours.  Imaging MRI brain without contrast 08/20/2022:  1. When comparing across modalities, similar size of a right subdural hematoma and a left CSF intensity subdural collection. Similar mass effect.  2. Small volume of subarachnoid hemorrhage along the right convexity. 3. Multiple small/punctate areas of restricted diffusion within bilateral frontal and parietal cortex are suspicious for tiny acute infarcts, or small intraparenchymal contusions and/or shear type injuries in the setting of trauma. 4. Remote infarcts and chronic microvascular ischemic disease.  ASSESSMENT AND PLAN: 82 year old male with right subdural hematoma secondary  to fall now with seizures.   Traumatic subdural hematoma Provoked seizures -Seizures most likely secondary to underlying subdural hematoma  Recommendations -Will load with Vimpat 200 mg once and start on 100 mg twice daily -Increase Keppra to 1500 mg twice daily -If seizures persist, can add phenobarbital -Patient is DNR.  Therefore goal is to slowly add medications without causing excessive sedation.  I have discussed this with patient's family at bedside again to confirm the DNR status. -Family understands that if seizures do not improve, they will consider transition to comfort care -Continue seizure precautions -As needed IV Versed for clinical seizures -Discussed plan with family at bedside and ICU team via secure chat -Management of rest of comorbidities per ICU team and neurosurgery  I have spent a total of  40  minutes with the patient reviewing hospital notes,  test results, labs and examining the patient as well as establishing an assessment and plan.  > 50% of time was spent in direct patient care.   Lindie Spruce Epilepsy [facility] For questions after 5pm please refer to AMION to reach the Neurologist on call

## 2022-08-22 NOTE — Progress Notes (Addendum)
Verbal order given at bedside to administer 2mg  IV ativan per Dr. Denese Killings for seizure activity noted on EEG. Keppra dose to be increased and Vimpat to be added to pt medications also per Dr. Denese Killings

## 2022-08-22 NOTE — Consult Note (Signed)
Palliative Care Consult Note                                  Date: 08/22/2022   Patient Name: Raymond Sampson  DOB: 04-13-1940  MRN: [id number]  Age / Sex: 82 y.o., male  PCP: Gracelyn Nurse, MD Referring Physician: Jadene Pierini, MD  Reason for Consultation: Establishing goals of care  HPI/Patient Profile: 82 y.o. male  with past medical history of CAD, stroke, DM2, HTN, HLD admitted on 08/18/2022 after a fall at his home with a large right-sided subdural hematoma. He was initially alert and oriented but on 08/21/22 he he became more drowsy and was only able to intermittently follow commands and intermittently answer questions. EEG to evaluate for seizure.   Palliative medicine was consulted to establish goals of care.   Past Medical History:  Diagnosis Date   Arthritic-like pain    CAD (coronary artery disease)    a. apical MI 5/07 with LHC showing 50% pCFX, 30% pRCA, 99% pLAD, 99% mLAD, 75% dLAD (cypher DES x3 to LAD); b. ETT-myoview (12/08) 81% MPHR,  EF 64%, partially reversible inferoapical perfusion defect similar to prior study 12/07; c. 04/2016 MV: EF 59%, fixed anteroapical defect, no ischemia->Low risk.   Carotid arterial disease (HCC)    a. 05/2016 Carotid U/S: 30-40% bilat dzs, f/u in 2 yrs.   Chest pain 04/13/2022   CVA (cerebral vascular accident) [facility])    a. 07/2010 - h/o CVA/TIA.   Diet-controlled type 2 diabetes mellitus (HCC)    HLD (hyperlipidemia)    HTN (hypertension)    PAT (paroxysmal atrial tachycardia)    a. 06/2016 Event monitor:  rare episodes of atrial tachycardia, longest 7 beats. No afib.    Subjective:   I have reviewed medical records including EPIC notes, labs and imaging, assessed the patient and then met with the patient's wife Demarquez Hamlyn and son Jourdin Magrath to discuss diagnosis prognosis, GOC, EOL wishes, disposition and options.  I introduced Palliative Medicine as specialized medical  care for people living with serious illness. It focuses on providing relief from symptoms and stress of a serious illness. The goal is to improve quality of life for both the patient and the family.  Created space and opportunity for patient  and family to explore thoughts and feelings regarding current medical situation. Values and goals of care important to patient and family were attempted to be elicited.    Patient/Family Understanding of Illness: The patient's wife has a good understanding of the plan of care. She understands the patient is having seizures and neuro is attempting to manage these with medication. I reminded her of the large subdural hematoma and multiple comorbidities the patient has. The patient's son Karsten Ro has a good understanding of the big picture.  Social History: Carlie and his wife Manson Allan have been married almost 30 years. They have a blended family with five adult children, grandchildren, and a great-grandchild. 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 Since retirement he has enjoyed relaxing and watching tv.  Functional and Nutritional State: Prior to admission the patient lived at home with his wife. He could walk around with using a walker. He did need help with some activities such as showering.  Today's Discussion:  Discussed the importance of continued conversation with family and the medical providers regarding overall plan of care and treatment options, ensuring decisions are  within the context of the patient's values and GOCs.  We discussed the seriousness of the patient's condition. The patient's family is hopeful the patient's neuro deficits are related to his seizures and not the subdural hematoma or a stroke. We discussed what the patient would want in various situations and both his wife and son agreed he would not want to live a life with his current neurological status.  We also discussed values for end of life. Glenis would  want comfort and dignity at end of life.  Questions and concerns were addressed. Hard Choices booklet left for review. The family was encouraged to call with questions or concerns. PMT will continue to support holistically.  Review of Systems  Unable to perform ROS   Objective:   Primary Diagnoses: Present on Admission:  Subdural hematoma (HCC)   Physical Exam Constitutional:      Appearance: He is ill-appearing.  HENT:     Head:     Comments: Bruising left forehead  Pulmonary:     Effort: Pulmonary effort is normal.     Vital Signs:  BP (!) 98/53   Pulse 68   Temp 97.8 F (36.6 C) (Axillary)   Resp 15   Ht 5\' 7"  (1.702 m)   Wt 64.6 kg   SpO2 100%   BMI 22.31 kg/m   Palliative Assessment/Data: 10%    Advanced Care Planning:   Existing Vynca/ACP Documentation: MOST form was completed 06/28/2022  Cardiopulmonary Resuscitation: Do Not Attempt Resuscitation (DNR/No CPR)  Medical Interventions: Limited Additional Interventions: Use medical treatment, IV fluids and cardiac monitoring as indicated, DO NOT USE intubation or mechanical ventilation. May consider use of less invasive airway support such as BiPAP or CPAP. Also provide comfort measures. Transfer to the hospital if indicated. Avoid intensive care.   Antibiotics: Antibiotics if indicated  IV Fluids: IV fluids if indicated  Feeding Tube: No feeding tube    Primary Decision Maker: The patient's wife Drummond Hankes is the current decision maker as the patient is unable to make medical decisions.  Code Status/Advance Care Planning: DNR  A discussion was had today regarding advanced directives. Concepts specific to code status, artifical feeding and hydration, continued IV antibiotics and rehospitalization was had.  The difference between a aggressive medical intervention path and a palliative comfort care path for this patient at this time was had.   Decisions/Changes to ACP: None  Assessment & Plan:    SUMMARY OF RECOMMENDATIONS   DNR Allow time for outcomes Encourage family to continue conversations around GOC  Prognosis:  Unable to determine  Discharge Planning:  To Be Determined   Discussed with: bedside RN and Tessie Fass NP   Thank you for allowing [place] to participate in the care of GRANT WOODRUM PMT will continue to support holistically.  Time Total: 60 minutes   Signed by: Sarina Ser, NP Palliative Medicine Team  Team Phone # [phone] (Nights/Weekends)  08/22/2022, 2:55 PM

## 2022-08-23 ENCOUNTER — Inpatient Hospital Stay (HOSPITAL_COMMUNITY): Payer: PPO

## 2022-08-23 DIAGNOSIS — Z66 Do not resuscitate: Secondary | ICD-10-CM

## 2022-08-23 DIAGNOSIS — R569 Unspecified convulsions: Secondary | ICD-10-CM | POA: Diagnosis not present

## 2022-08-23 DIAGNOSIS — G9340 Encephalopathy, unspecified: Secondary | ICD-10-CM

## 2022-08-23 DIAGNOSIS — I619 Nontraumatic intracerebral hemorrhage, unspecified: Secondary | ICD-10-CM | POA: Diagnosis not present

## 2022-08-23 DIAGNOSIS — S065XAA Traumatic subdural hemorrhage with loss of consciousness status unknown, initial encounter: Secondary | ICD-10-CM | POA: Diagnosis not present

## 2022-08-23 LAB — GLUCOSE, CAPILLARY
Glucose-Capillary: 139 mg/dL — ABNORMAL HIGH (ref 70–99)
Glucose-Capillary: 140 mg/dL — ABNORMAL HIGH (ref 70–99)

## 2022-08-23 LAB — MAGNESIUM: Magnesium: 1.9 mg/dL (ref 1.7–2.4)

## 2022-08-23 LAB — PHOSPHORUS: Phosphorus: 3 mg/dL (ref 2.5–4.6)

## 2022-08-23 MED ORDER — PROSOURCE TF20 ENFIT COMPATIBL EN LIQD
60.0000 mL | Freq: Two times a day (BID) | ENTERAL | Status: DC
  Administered 2022-08-23 – 2022-09-06 (×30): 60 mL
  Filled 2022-08-23 (×30): qty 60

## 2022-08-23 MED ORDER — JEVITY 1.5 CAL/FIBER PO LIQD
1000.0000 mL | ORAL | Status: DC
  Administered 2022-08-23 – 2022-09-09 (×17): 1000 mL
  Filled 2022-08-23 (×23): qty 1000

## 2022-08-23 MED ORDER — THIAMINE MONONITRATE 100 MG PO TABS
100.0000 mg | ORAL_TABLET | Freq: Every day | ORAL | Status: AC
  Administered 2022-08-23 – 2022-08-29 (×7): 100 mg
  Filled 2022-08-23 (×7): qty 1

## 2022-08-23 MED ORDER — ADULT MULTIVITAMIN W/MINERALS CH
1.0000 | ORAL_TABLET | Freq: Every day | ORAL | Status: DC
  Administered 2022-08-23 – 2022-09-21 (×29): 1
  Filled 2022-08-23 (×30): qty 1

## 2022-08-23 NOTE — Procedures (Addendum)
Patient Name: Raymond Sampson  MRN: [id number]  Epilepsy Attending: Charlsie Quest  Referring Physician/Provider: Jefferson Fuel, MD  Duration: 08/22/2019 1750 to 08/23/2022 1750   Patient history: 82 y.o. male with history of CAD, stroke, diabetes, hyperlipidemia, hypertension and paroxysmal atrial tachycardia who presented originally after falling off of 1 step off his deck at home and having a head injury with a large right-sided subdural hematoma.  He was initially alert and oriented and mentally at his baseline, but yesterday around 1300, he became more drowsy and was only able to intermittently follow commands and intermittently answer questions. EEG to evaluate for seizure.   Level of alertness: Awake, asleep   AEDs during EEG study: LEV, LCM   Technical aspects: This EEG study was done with scalp electrodes positioned according to the 10-20 International system of electrode placement. Electrical activity was reviewed with band pass filter of 1-70Hz , sensitivity of 7 uV/mm, display speed of 53mm/sec with a 60Hz  notched filter applied as appropriate. EEG data were recorded continuously and digitally stored.  Video monitoring was available and reviewed as appropriate.   Description: No clear posterior dominant rhythm was seen. Sleep was characterized by vertex waves, sleep spindles (12 to 14 Hz), maximal frontocentral region.  EEG showed continuous generalized and lateralized right hemisphere 3-7 theta-delta slowing. Sharp waves were noted in  right centro-parietal regio, at times in runs lasting 3-5 seconds.     Hyperventilation and photic stimulation were not performed.      ABNORMALITY -Sharp waves,  right centro-parietal region. -Continuous slow, generalized and lateralized right hemisphere   IMPRESSION: This study initially showed evidence of epileptogenicity arising from right centro-parietal region. Additionally, there is evidence of cortical dysfunction in right hemisphere  likely secondary to underlying structural abnormality.  Lastly there is moderate diffuse encephalopathy. No seizures were noted.  EEG appears to be improving compared to previous day.   Fionnuala Hemmerich Annabelle Harman

## 2022-08-23 NOTE — Progress Notes (Signed)
Speech Language Pathology Treatment: Dysphagia  Patient Details Name: MACEDONIO MINCEY MRN: [id number] DOB: 1940-12-06 Today's Date: 08/23/2022 Time: 4098-1191 SLP Time Calculation (min) (ACUTE ONLY): 16 min  Assessment / Plan / Recommendation Clinical Impression  Pt with improved level of arousal as compared to yesterday, but still with decreased responsiveness.  Pt is no longer having active seizure activity.  Eyes closed for majority of session.  SLP provided oral care prior to administration of PO trials.  Pt requested water during oral care.  Pt was given small amounts of water by spoon.  Laryngeal bobbing noted but without full pharyngeal swallow response on first 3 of 5 trials.  There was wet cough on 2 of these trials.  Pharyngeal swallow reflex palpated on 2 additional trials.  On second of these, reduced laryngeal elevation noted on first swallow with a subsequent swallow with improved hyolaryngeal excursion.  Pt is not yet ready for repeat instrumental assessment.  Provided education regarding ice chip/water protocol to wife [name]. Pt without top dentures in place today.  Recommend 1/8-1/4 teaspoon amounts of water over ice chips so that pt does not have to masticate/orally manipulate bolus.  Recommend pt remain NPO at this time. Consider short term alternate means of nutrition, hydration, and medication. Pt may have small amounts of water by spoon in moderation, after good oral care, when fully awake/alert, with upright positioning and direct supervision.     HPI HPI: 82 year old male who presented for fall. Hit forehead with laceration repair, no LOC, no facial fx, but CT 5/18 revealed: "stable large, approximately 17 mm thick Right Subdural Hematoma, most pronounced along the anterior right frontal convexity. Stable small volume right hemisphere [facility]."  Chest CT 5/17: "Emphysema. Right pleural effusion. Hazy upper lobe  airspace disease is indeterminate for edema or pneumonia." Neuro  change noted by RN around 1300 on 5/19 with workup ongoing at present. Pt with history of CAD, prior stroke, hypertension, hyperlipidemia, type 2 diabetes, chronic antiplatelet use with aspirin and Plavix. MBS revealed hardware from ACDG      SLP Plan  Continue with current plan of care      Recommendations for follow up therapy are one component of a multi-disciplinary discharge planning process, led by the attending physician.  Recommendations may be updated based on patient status, additional functional criteria and insurance authorization.    Recommendations  Diet recommendations: NPO Medication Administration: Via alternative means                  Oral care QID;Oral care prior to ice chip/H20   Frequent or constant Supervision/Assistance Dysphagia, oropharyngeal phase (R13.12)     Continue with current plan of care     Kerrie Pleasure, MA, CCC-SLP Acute Rehabilitation Services Office: [phone] 08/23/2022, 10:18 AM

## 2022-08-23 NOTE — Progress Notes (Signed)
Initial Nutrition Assessment  DOCUMENTATION CODES:   Severe malnutrition in context of chronic illness  INTERVENTION:   Initiate tube feeding via Cortrak tube: Jevity 1.5 at 25 ml/h and increase by 10 ml every 8 hours to goal rate of 45 ml/hr (1080 ml per day) Prosource TF20 60 ml BID  Provides 1780 kcal, 107 gm protein, 820 ml free water daily  MVI with minerals daily  100 mg thiamine daily x 7 days  Once IVF's d/c'ed recommend:  200 ml free water every 6 hours Total free water: 1620 ml   NUTRITION DIAGNOSIS:   Severe Malnutrition related to chronic illness (fall) as evidenced by severe fat depletion, severe muscle depletion.  GOAL:   Patient will meet greater than or equal to 90% of their needs  MONITOR:   Diet advancement, TF tolerance  REASON FOR ASSESSMENT:   Consult Enteral/tube feeding initiation and management  ASSESSMENT:   Pt with PMH of CAD, CVA 2012, DM, HLD, and HTN admitted with traumatic SDH.   Spoke with provider, ok to start TF after cortrak placed.  Per medical team palliative consulted and awaiting time to see functional neuro status   05/21 - seizures 05/22 s/p cortrak placement, per xray tip in gastric antrum   Spoke with wife and daughter. Per wife pt fell in March 2024 and was less mobile and eating less which resulted in ~ 20 lb weight loss. Usual body weight 159-161 lb. Current weight 142 lb (10.6% weight loss x 2 months). Per chart review weight loss appears to have started after jan 2024  Pt uses walker and assistance with wife for walking but does go out.  Breakfast (out): egg, tomato, grits, toast, peaches Dinner: pintos, corn, potatoes and gravy Pt is a very picky eater per family. Does not eat meats, sometimes does have a hamburger steak.  Dentures no longer fit well and move around a lot.  During hospitalization recently pt would drink chocolate protein shake with 30 grams of protein   Medications reviewed and include:  NS @  75 ml/hr  Labs reviewed:  Na 132 A1C: 6  NUTRITION - FOCUSED PHYSICAL EXAM:  Flowsheet Row Most Recent Value  Orbital Region Severe depletion  Upper Arm Region Severe depletion  Thoracic and Lumbar Region Severe depletion  Buccal Region Severe depletion  Temple Region Severe depletion  Clavicle Bone Region Severe depletion  Clavicle and Acromion Bone Region Severe depletion  Scapular Bone Region Severe depletion  Dorsal Hand Severe depletion  Patellar Region Severe depletion  Anterior Thigh Region Severe depletion  Posterior Calf Region Severe depletion  Edema (RD Assessment) None  Hair Reviewed  Eyes Unable to assess  Mouth Reviewed  Skin Reviewed  [+ecchymosis]  Nails Reviewed       Diet Order:   Diet Order             Diet NPO time specified  Diet effective now                   EDUCATION NEEDS:   Not appropriate for education at this time  Skin:  Skin Assessment: Reviewed RN Assessment (laceration: face)  Last BM:  unknown, abd soft on exam  Height:   Ht Readings from Last 1 Encounters:  08/18/22 5\' 7"  (1.702 m)    Weight:   Wt Readings from Last 1 Encounters:  08/18/22 64.6 kg    BMI:  Body mass index is 22.31 kg/m.  Estimated Nutritional Needs:   Kcal:  1700-1900  Protein:  100-115 grams  Fluid:  >1.7 L/day  Cammy Copa., RD, LDN, CNSC See AMiON for contact information

## 2022-08-23 NOTE — Progress Notes (Signed)
NAME:  Raymond Sampson, MRN:  [id number], DOB:  [birth year], LOS: 5 ADMISSION DATE:  08/18/2022, CONSULTATION DATE: 08/22/2022 REFERRING MD: Charlynn Grimes, MD, CHIEF COMPLAINT: Status epilepticus.  History of Present Illness:  82 year old man who presented with traumatic subdural hematoma.  He was initially minimally symptomatic and refused surgery. Unfortunately since then he has developed intermittent focal seizures.  Pertinent  Medical History   Past Medical History:  Diagnosis Date   Arthritic-like pain    CAD (coronary artery disease)    a. apical MI 5/07 with LHC showing 50% pCFX, 30% pRCA, 99% pLAD, 99% mLAD, 75% dLAD (cypher DES x3 to LAD); b. ETT-myoview (12/08) 81% MPHR,  EF 64%, partially reversible inferoapical perfusion defect similar to prior study 12/07; c. 04/2016 MV: EF 59%, fixed anteroapical defect, no ischemia->Low risk.   Carotid arterial disease (HCC)    a. 05/2016 Carotid U/S: 30-40% bilat dzs, f/u in 2 yrs.   Chest pain 04/13/2022   CVA (cerebral vascular accident) [facility])    a. 07/2010 - h/o CVA/TIA.   Diet-controlled type 2 diabetes mellitus (HCC)    HLD (hyperlipidemia)    HTN (hypertension)    PAT (paroxysmal atrial tachycardia)    a. 06/2016 Event monitor:  rare episodes of atrial tachycardia, longest 7 beats. No afib.   Significant Hospital Events: Including procedures, antibiotic start and stop dates in addition to other pertinent events   5/21 -intermittent seizure activity by EEG  Interim History / Subjective:  Patient lying in bed.  Periods of intermittent unresponsiveness although he was able to get up to chair with physical therapy earlier on.  Objective   Blood pressure 138/67, pulse 82, temperature 98.6 F (37 C), temperature source Axillary, resp. rate 17, height 5\' 7"  (1.702 m), weight 64.6 kg, SpO2 100 %.    FiO2 (%):  [28 %] 28 %   Intake/Output Summary (Last 24 hours) at 08/23/2022 1031 Last data filed at 08/23/2022 0800 Gross per 24 hour   Intake 1885.89 ml  Output 750 ml  Net 1135.89 ml   Filed Weights   08/18/22 1623 08/18/22 2345  Weight: 65.2 kg 64.6 kg    Examination: General: Frail-appearing. HENT: L periorbital edema and ecchymosis. Dry pink mm  Lungs: CTAb  Cardiovascular: rr cap refill <3sec  Abdomen: soft  Extremities: no acute joint deformity  Neuro:Opens eyes to stimulation. Oriented x2. Following commands. Profoundly weak.  GU: condom cath   Ancillary tests personally reviewed  Large frontal right acute subdural hematoma.  Assessment & Plan:   Traumatic SDH Provoked seizures Acute encephalopathy (think this is sedating effects of AEDs, maybe some delirium)  CAD DM2 HTN Hx prior CVA  DNR status -no further sz on cEEG 5/22  -no surgical eval of SDH (age, frailty, pt/fam declined) Plan: -stable to transfer out of ICU 5/22 -AEDs per neuro -- on Vimpat, Keppra  -Sz precautions  -defer cEEG to neuro, but think reasonable to maintain for another day  -frequent reorientation, delirium precautions -Keep NPO for now, SLP is following -will order for cortrak  -PRN IV antihypertensives until able to take POs, or if alt means est  -holding plavix, NSGY rec is x1wk   -PT/OT/SLP  -DNR, palliative has been consulted and plan is to allow time to essentially let the dust settle and see what his functional neuro status is.    Best Practice (right click and "Reselect all SmartList Selections" daily)   Diet/type: NPO DVT prophylaxis: SCD GI prophylaxis: N/A Lines: N/A Foley:  N/A Code Status:  DNR Last date of multidisciplinary goals of care discussion [family updated bedside 5/22  CCT: n/a  Tessie Fass MSN, AGACNP-BC [facility] Pulmonary/Critical Care Medicine Amion for pager  08/23/2022, 10:31 AM

## 2022-08-23 NOTE — Progress Notes (Addendum)
Subjective: Wife and daughter at bedside.  No further seizures after loading with Vimpat.  Patient was more awake and answering questions earlier in the day.  He had just received Keppra and Vimpat prior to my examination and was therefore pretty lethargic.   ROS: Unable to obtain due to poor mental status  Examination  Vital signs in last 24 hours: Temp:  [97.8 F (36.6 C)-98.6 F (37 C)] 98.5 F (36.9 C) (05/22 1200) Pulse Rate:  [68-89] 89 (05/22 1200) Resp:  [12-23] 21 (05/22 1200) BP: (103-156)/(48-88) 153/71 (05/22 1200) SpO2:  [89 %-100 %] 100 % (05/22 1200) FiO2 (%):  [28 %] 28 % (05/22 0400)  General: lying in bed, NAD Neuro: Opens eyes to repeated tactile and verbal stimulation but did not follow commands, PERRLA, no forced gaze deviation, withdraws to noxious stimuli in all 4 extremities with left hemiparesis  Basic Metabolic Panel: Recent Labs  Lab 08/18/22 1612 08/21/22 1433  NA 137 132*  K 3.9 4.5  CL 99 97*  CO2 25 27  GLUCOSE 148* 204*  BUN 17 26*  CREATININE 1.03 1.15  CALCIUM 8.6* 8.6*    CBC: Recent Labs  Lab 08/18/22 1612  WBC 7.3  NEUTROABS 4.9  HGB 9.9*  HCT 31.3*  MCV 94.8  PLT 297     Coagulation Studies: No results for input(s): "LABPROT", "INR" in the last 72 hours.  Imaging No new brain imaging overnight  ASSESSMENT AND PLAN: 82 year old male with right subdural hematoma secondary to fall now with seizures.    Traumatic subdural hematoma Provoked seizures -Seizures most likely secondary to underlying subdural hematoma   Recommendations -Continue Keppra 1500 mg twice daily and Vimpat 100 mg twice daily -If seizures persist, can add phenobarbital -Continue LTM EEG overnight, will DC tomorrow if patient remains seizure-free -Continue seizure precautions -As needed IV Versed for clinical seizures -Discussed plan with family at bedside and ICU team via secure chat -Management of rest of comorbidities per ICU team and  neurosurgery   I have spent a total of  26 minutes with the patient reviewing hospital notes,  test results, labs and examining the patient as well as establishing an assessment and plan.  > 50% of time was spent in direct patient care.    Lindie Spruce Epilepsy [facility] For questions after 5pm please refer to AMION to reach the Neurologist on call

## 2022-08-23 NOTE — Progress Notes (Signed)
LTM maint complete - no skin breakdown under: Fp1 Fp2 F4 [facility] monitored, Event button test confirmed by [facility].  

## 2022-08-23 NOTE — Progress Notes (Signed)
Pt arrived to unit

## 2022-08-23 NOTE — Progress Notes (Signed)
Daily Progress Note   Patient Name: Raymond Sampson       Date: 08/23/2022 DOB: [birth year]  Age: 82 y.o. MRN#: [id number] Attending Physician: Lynnell Catalan, MD Primary Care Physician: Gracelyn Nurse, MD Admit Date: 08/18/2022  Reason for Consultation/Follow-up: Establishing goals of care  Subjective: Patient lying in bed. He appears comfortable and sleeping.  Length of Stay: 5  Current Medications: Scheduled Meds:   Chlorhexidine Gluconate Cloth  6 each Topical Daily   heparin injection (subcutaneous)  5,000 Units Subcutaneous Q8H   mupirocin ointment  1 Application Nasal BID   mouth rinse  15 mL Mouth Rinse 4 times per day    Continuous Infusions:  sodium chloride 75 mL/hr at 08/23/22 0949   lacosamide (VIMPAT) IV 100 mg (08/23/22 0950)   levETIRAcetam Stopped (08/23/22 0752)    PRN Meds: [DISCONTINUED] acetaminophen **OR** acetaminophen, HYDROmorphone (DILAUDID) injection, labetalol, [DISCONTINUED] ondansetron **OR** ondansetron (ZOFRAN) IV, mouth rinse, mouth rinse  Physical Exam Vitals reviewed.  Constitutional:      Appearance: He is ill-appearing.  Pulmonary:     Effort: Pulmonary effort is normal.  Skin:    General: Skin is warm and dry.     Findings: Bruising (left forehead/face) present.            Vital Signs: BP 138/67   Pulse 82   Temp 98.6 F (37 C) (Axillary)   Resp 17   Ht 5\' 7"  (1.702 m)   Wt 64.6 kg   SpO2 100%   BMI 22.31 kg/m  SpO2: SpO2: 100 % O2 Device: O2 Device: Nasal Cannula O2 Flow Rate: O2 Flow Rate (L/min): 2 L/min  Intake/output summary:  Intake/Output Summary (Last 24 hours) at 08/23/2022 1044 Last data filed at 08/23/2022 0800 Gross per 24 hour  Intake 1885.89 ml  Output 750 ml  Net 1135.89 ml   LBM: Last BM Date :   (pta) Baseline Weight: Weight: 65.2 kg Most recent weight: Weight: 64.6 kg       Palliative Assessment/Data:20%      Patient Active Problem List   Diagnosis Date Noted   Subdural hematoma (HCC) 08/18/2022   Fall 06/22/2022   Malnutrition of moderate degree 06/22/2022   Acute blood loss anemia 06/21/2022   Multiple rib fractures involving four or more ribs, right side 06/20/2022   Intramuscular  hematoma 06/19/2022   Rib fractures 06/19/2022   Hemothorax on right 06/19/2022   Leukocytosis 06/19/2022   Status post coronary artery stent placement 05/02/2022   Mild dementia (HCC) 04/14/2022   Non-ST elevation (NSTEMI) myocardial infarction (HCC) 04/13/2022   History of COVID-19 04/13/2022   Lung nodule 04/13/2022   PAD (peripheral artery disease) (HCC) 04/13/2022   Flu 06/05/2017   Diabetes mellitus type 2, controlled, with complications (HCC) 04/06/2015   Carotid stenosis 05/14/2013   CAROTID BRUIT, LEFT 04/25/2010   PALPITATIONS 06/18/2009   Hyperlipidemia 04/27/2009   Essential hypertension 04/27/2009   Coronary artery disease of native artery of native heart with stable angina pectoris (HCC) 04/27/2009    Palliative Care Assessment & Plan   Patient Profile: 82 y.o. male  with past medical history of CAD, stroke, DM2, HTN, HLD admitted on 08/18/2022 after a fall at his home with a large right-sided subdural hematoma. He was initially alert and oriented but on 08/21/22 he he became more drowsy and was only able to intermittently follow commands and intermittently answer questions. EEG to evaluate for seizure.   Assessment: Patient was sleeping. Patient's wife was excited that the seizures had stopped and the patient had some purposeful movements and was able to identify her by name and relation.   We discussed that the patient's inability to swallow is still concerning. She agrees. I told her that I am still very concerned about the patient's overall condition. Speech was  going in as I was leaving.  Encouraged her to spend time with her husband since he has been more interactive today. Encouraged her to continue conversations around [facility].  Recommendations/Plan: DNR Allow time for outcomes Encourage family to continue conversations around [facility]  Goals of Care and Additional Recommendations: Limitations on Scope of Treatment: Use MOST form for guidance  Code Status:    Code Status Orders  (From admission, onward)           Start     Ordered   08/19/22 0022  Do not attempt resuscitation (DNR)  Continuous       Question Answer Comment  If patient has no pulse and is not breathing Do Not Attempt Resuscitation   If patient has a pulse and/or is breathing: Medical Treatment Goals COMFORT MEASURES: Keep clean/warm/dry, use medication by any route; positioning, wound care and other measures to relieve pain/suffering; use oxygen, suction/manual treatment of airway obstruction for comfort; do not transfer unless for comfort needs.   Consent: Discussion documented in EHR or advanced directives reviewed      08/19/22 0023           Code Status History     Date Active Date Inactive Code Status Order ID Comments User Context   08/18/2022 2105 08/19/2022 0023 Full Code [id number]  Vonna Drafts ED   06/28/2022 1422 07/03/2022 2122 DNR [id number]  Morton Stall, NP Inpatient   06/19/2022 1721 06/28/2022 1422 Full Code [id number]  Floydene Flock, MD ED   05/02/2022 1135 05/03/2022 1523 Full Code [id number]  Yvonne Kendall, MD Inpatient   04/13/2022 0048 04/15/2022 2115 Full Code [id number]  Nolberto Hanlon, MD ED   06/05/2017 2015 06/07/2017 1757 Full Code [id number]  Ihor Austin, MD Inpatient       Prognosis:  Unable to determine  Discharge Planning: To Be Determined  Care plan was discussed with bedside RN Gerilyn Nestle, Dr. Denese Killings, and Dr. Melynda Ripple  Time spent: 45 minutes  Detailed review of medical records ( labs, imaging, vital  signs), medically  appropriate exam, counseling and education to care partner, documenting clinical information, medication management, coordination of care.   Thank you for allowing the Palliative Medicine Team to assist in the care of this patient.    Sherryll Burger, NP  Please contact Palliative Medicine Team phone at [phone] for questions and concerns.

## 2022-08-23 NOTE — Procedures (Signed)
Cortrak  Person Inserting Tube:  Tamerra Merkley D, RD Tube Type:  Cortrak - 43 inches Tube Size:  10 Tube Location:  Left nare Secured by: Bridle Technique Used to Measure Tube Placement:  Marking at nare/corner of mouth Cortrak Secured At:  68 cm Procedure Comments:  Cortrak Tube Team Note:  Consult received to place a Cortrak feeding tube.   X-ray is required, abdominal x-ray has been ordered by the Cortrak team. Please confirm tube placement before using the Cortrak tube.   If the tube becomes dislodged please keep the tube and contact the Cortrak team at [email or web address] for replacement.  If after hours and replacement cannot be delayed, place a NG tube and confirm placement with an abdominal x-ray.    Davari Lopes, RD, LDN Clinical Dietitian RD pager # available in AMION  After hours/weekend pager # available in AMION    

## 2022-08-24 ENCOUNTER — Inpatient Hospital Stay (HOSPITAL_COMMUNITY): Payer: PPO

## 2022-08-24 DIAGNOSIS — E43 Unspecified severe protein-calorie malnutrition: Secondary | ICD-10-CM

## 2022-08-24 DIAGNOSIS — R569 Unspecified convulsions: Secondary | ICD-10-CM | POA: Diagnosis not present

## 2022-08-24 DIAGNOSIS — G9340 Encephalopathy, unspecified: Secondary | ICD-10-CM | POA: Diagnosis not present

## 2022-08-24 DIAGNOSIS — S065X0A Traumatic subdural hemorrhage without loss of consciousness, initial encounter: Secondary | ICD-10-CM

## 2022-08-24 DIAGNOSIS — Z66 Do not resuscitate: Secondary | ICD-10-CM

## 2022-08-24 DIAGNOSIS — S065XAA Traumatic subdural hemorrhage with loss of consciousness status unknown, initial encounter: Secondary | ICD-10-CM | POA: Diagnosis not present

## 2022-08-24 LAB — GLUCOSE, CAPILLARY
Glucose-Capillary: 165 mg/dL — ABNORMAL HIGH (ref 70–99)
Glucose-Capillary: 165 mg/dL — ABNORMAL HIGH (ref 70–99)
Glucose-Capillary: 173 mg/dL — ABNORMAL HIGH (ref 70–99)
Glucose-Capillary: 197 mg/dL — ABNORMAL HIGH (ref 70–99)
Glucose-Capillary: 207 mg/dL — ABNORMAL HIGH (ref 70–99)

## 2022-08-24 LAB — BASIC METABOLIC PANEL
Anion gap: 10 (ref 5–15)
BUN: 34 mg/dL — ABNORMAL HIGH (ref 8–23)
CO2: 23 mmol/L (ref 22–32)
Calcium: 8.5 mg/dL — ABNORMAL LOW (ref 8.9–10.3)
Chloride: 103 mmol/L (ref 98–111)
Creatinine, Ser: 0.82 mg/dL (ref 0.61–1.24)
GFR, Estimated: >60 mL/min (ref >60)
Glucose, Bld: 195 mg/dL — ABNORMAL HIGH (ref 70–99)
Potassium: 3.5 mmol/L (ref 3.5–5.1)
Sodium: 136 mmol/L (ref 135–145)

## 2022-08-24 LAB — PHOSPHORUS
Phosphorus: 1.8 mg/dL — ABNORMAL LOW (ref 2.5–4.6)
Phosphorus: 2.5 mg/dL (ref 2.5–4.6)

## 2022-08-24 LAB — CK: Total CK: 44 U/L — ABNORMAL LOW (ref 49–397)

## 2022-08-24 LAB — MAGNESIUM
Magnesium: 2 mg/dL (ref 1.7–2.4)
Magnesium: 1.6 mg/dL — ABNORMAL LOW (ref 1.7–2.4)

## 2022-08-24 MED ORDER — INSULIN ASPART 100 UNIT/ML IJ SOLN
0.0000 [IU] | INTRAMUSCULAR | Status: DC
  Administered 2022-08-24: 3 [IU] via SUBCUTANEOUS
  Administered 2022-08-24: 5 [IU] via SUBCUTANEOUS
  Administered 2022-08-25 (×2): 3 [IU] via SUBCUTANEOUS
  Administered 2022-08-25: 5 [IU] via SUBCUTANEOUS
  Administered 2022-08-25 – 2022-08-26 (×9): 3 [IU] via SUBCUTANEOUS
  Administered 2022-08-27: 2 [IU] via SUBCUTANEOUS
  Administered 2022-08-27 – 2022-08-28 (×9): 3 [IU] via SUBCUTANEOUS
  Administered 2022-08-28: 5 [IU] via SUBCUTANEOUS
  Administered 2022-08-28 – 2022-08-29 (×2): 3 [IU] via SUBCUTANEOUS
  Administered 2022-08-29: 2 [IU] via SUBCUTANEOUS
  Administered 2022-08-29 (×2): 3 [IU] via SUBCUTANEOUS
  Administered 2022-08-29: 5 [IU] via SUBCUTANEOUS
  Administered 2022-08-29: 3 [IU] via SUBCUTANEOUS
  Administered 2022-08-29: 5 [IU] via SUBCUTANEOUS
  Administered 2022-08-30 (×4): 3 [IU] via SUBCUTANEOUS
  Administered 2022-08-30: 5 [IU] via SUBCUTANEOUS
  Administered 2022-08-31 (×3): 3 [IU] via SUBCUTANEOUS
  Administered 2022-08-31: 2 [IU] via SUBCUTANEOUS
  Administered 2022-08-31 (×2): 3 [IU] via SUBCUTANEOUS
  Administered 2022-09-01: 2 [IU] via SUBCUTANEOUS
  Administered 2022-09-01 (×3): 3 [IU] via SUBCUTANEOUS
  Administered 2022-09-01: 2 [IU] via SUBCUTANEOUS
  Administered 2022-09-02 (×2): 3 [IU] via SUBCUTANEOUS
  Administered 2022-09-02: 2 [IU] via SUBCUTANEOUS
  Administered 2022-09-02: 5 [IU] via SUBCUTANEOUS
  Administered 2022-09-02 – 2022-09-03 (×2): 3 [IU] via SUBCUTANEOUS
  Administered 2022-09-03 (×2): 2 [IU] via SUBCUTANEOUS
  Administered 2022-09-03 (×3): 3 [IU] via SUBCUTANEOUS
  Administered 2022-09-04: 5 [IU] via SUBCUTANEOUS
  Administered 2022-09-04 (×2): 3 [IU] via SUBCUTANEOUS
  Administered 2022-09-04: 2 [IU] via SUBCUTANEOUS
  Administered 2022-09-04: 3 [IU] via SUBCUTANEOUS
  Administered 2022-09-04: 5 [IU] via SUBCUTANEOUS
  Administered 2022-09-05: 3 [IU] via SUBCUTANEOUS
  Administered 2022-09-05: 5 [IU] via SUBCUTANEOUS
  Administered 2022-09-05 (×3): 2 [IU] via SUBCUTANEOUS
  Administered 2022-09-05 – 2022-09-06 (×4): 3 [IU] via SUBCUTANEOUS
  Administered 2022-09-06: 2 [IU] via SUBCUTANEOUS
  Administered 2022-09-06 – 2022-09-07 (×3): 3 [IU] via SUBCUTANEOUS
  Administered 2022-09-07: 2 [IU] via SUBCUTANEOUS
  Administered 2022-09-07 (×2): 3 [IU] via SUBCUTANEOUS
  Administered 2022-09-07: 2 [IU] via SUBCUTANEOUS
  Administered 2022-09-08 (×2): 3 [IU] via SUBCUTANEOUS
  Administered 2022-09-08 – 2022-09-09 (×4): 2 [IU] via SUBCUTANEOUS
  Administered 2022-09-09 (×2): 3 [IU] via SUBCUTANEOUS
  Administered 2022-09-10 (×4): 2 [IU] via SUBCUTANEOUS
  Administered 2022-09-11 – 2022-09-13 (×5): 3 [IU] via SUBCUTANEOUS
  Administered 2022-09-13: 2 [IU] via SUBCUTANEOUS
  Administered 2022-09-13: 3 [IU] via SUBCUTANEOUS
  Administered 2022-09-14 (×2): 2 [IU] via SUBCUTANEOUS
  Administered 2022-09-14: 3 [IU] via SUBCUTANEOUS
  Administered 2022-09-14 (×2): 2 [IU] via SUBCUTANEOUS
  Administered 2022-09-14 – 2022-09-15 (×2): 3 [IU] via SUBCUTANEOUS
  Administered 2022-09-15: 5 [IU] via SUBCUTANEOUS

## 2022-08-24 MED ORDER — CARVEDILOL 6.25 MG PO TABS
6.2500 mg | ORAL_TABLET | Freq: Two times a day (BID) | ORAL | Status: DC
  Administered 2022-08-24 – 2022-08-25 (×3): 6.25 mg
  Filled 2022-08-24 (×3): qty 1

## 2022-08-24 MED ORDER — POTASSIUM CHLORIDE 20 MEQ PO PACK
40.0000 meq | PACK | Freq: Once | ORAL | Status: AC
  Administered 2022-08-24: 40 meq
  Filled 2022-08-24: qty 2

## 2022-08-24 MED ORDER — LIDOCAINE 5 % EX PTCH
1.0000 | MEDICATED_PATCH | CUTANEOUS | Status: DC
  Administered 2022-08-24 – 2022-09-07 (×15): 1 via TRANSDERMAL
  Filled 2022-08-24 (×24): qty 1

## 2022-08-24 MED ORDER — ACETAMINOPHEN 160 MG/5ML PO SOLN
650.0000 mg | Freq: Four times a day (QID) | ORAL | Status: DC | PRN
  Administered 2022-08-24 – 2022-09-20 (×10): 650 mg
  Filled 2022-08-24 (×11): qty 20.3

## 2022-08-24 MED ORDER — SODIUM PHOSPHATES 45 MMOLE/15ML IV SOLN
20.0000 mmol | Freq: Once | INTRAVENOUS | Status: AC
  Administered 2022-08-24: 20 mmol via INTRAVENOUS
  Filled 2022-08-24: qty 6.67

## 2022-08-24 MED ORDER — ATORVASTATIN CALCIUM 80 MG PO TABS
80.0000 mg | ORAL_TABLET | Freq: Every day | ORAL | Status: DC
  Administered 2022-08-24 – 2022-09-21 (×29): 80 mg
  Filled 2022-08-24 (×32): qty 1

## 2022-08-24 MED ORDER — LEVETIRACETAM IN NACL 1000 MG/100ML IV SOLN
1000.0000 mg | Freq: Two times a day (BID) | INTRAVENOUS | Status: DC
  Administered 2022-08-24 – 2022-09-01 (×16): 1000 mg via INTRAVENOUS
  Filled 2022-08-24 (×16): qty 100

## 2022-08-24 MED ORDER — SERTRALINE HCL 50 MG PO TABS
25.0000 mg | ORAL_TABLET | Freq: Every evening | ORAL | Status: DC
  Administered 2022-08-24 – 2022-09-21 (×28): 25 mg
  Filled 2022-08-24 (×28): qty 1

## 2022-08-24 NOTE — Progress Notes (Signed)
PROGRESS NOTE  Raymond Sampson ZOX:[id number] DOB: 04-20-1940   PCP: Gracelyn Nurse, MD  Patient is from: Home.  Lives with his wife.  Has rolling walker but does not use.  DOA: 08/18/2022 LOS: 6  Chief complaints Chief Complaint  Patient presents with   Fall on [place]     Brief Narrative / Interim history: 82 year old M with PMH of CAD, CVA, HTN, HLD, DM-2, cervical spine fusion and OA presented to ED after fall at home and found to have traumatic right frontal SDH measuring 2.2 cm in maximum thickness with 5 to 6 mm midline shift to the left, and trace SAH at the right temporal lobe.  Patient was on Plavix and aspirin.  CT cervical spine without acute finding but some sclerotic changes at C2-C3 facet joint.  He was admitted to ICU by neurosurgery. He was initially minimally symptomatic and refused surgery.  However, he developed intermittent focal seizure.   Repeat CT head on 5/18 with stable SDH and SAH.  MRI brain showed multiple small/punctate areas of restricted diffusion with bilateral frontal and parietal cortex suspicious for tiny acute infarcts or small intraparenchymal contusion and/or shear type injury in the setting of trauma.  EEG on 5/21 showed intermittent seizure.  Neurology consulted.  Patient was started on Keppra and Vimpat.  Palliative medicine consulted as well.  Patient was transferred to hospitalist service on 5/23.  He is on tube feed via cortrack.     Subjective: Seen and examined earlier this morning.  No major events overnight of this morning.  No specific complaints.  Patient's wife at bedside.  Reportedly had significant left hip pain while working with PT.  Objective: Vitals:   08/23/22 2323 08/24/22 0430 08/24/22 0500 08/24/22 0755  BP: (!) 172/80 (!) 161/79  (!) 157/78  Pulse: (!) 109 (!) 102  (!) 102  Resp: 16 16  18   Temp: 98.7 F (37.1 C) 98.4 F (36.9 C)  98.5 F (36.9 C)  TempSrc: Oral   Oral  SpO2: 100% 99%  98%  Weight:   69.5 kg    Height:        Examination:  GENERAL: Appears frail.  No apparent distress. HEENT: MMM.  Bruises on his left face around his eye NECK: Supple.  No apparent JVD.  RESP:  No IWOB.  Fair aeration bilaterally. CVS:  RRR. Heart sounds normal.  ABD/GI/GU: BS+. Abd soft, NTND.  MSK/EXT:  Moves extremities.  Significant muscle mass and subcu fat loss.  Tenderness over left hip.  Limited ROM in left hip due to pain. SKIN: Bruising over left hip. NEURO: Awake, alert and oriented fairly.  Follows commands.  No apparent focal neuro deficit. PSYCH: Calm. Normal affect.   Procedures:  None  Microbiology summarized: MRSA PCR screen positive.  Assessment and plan: Principal Problem:   Traumatic subdural hematoma without loss of consciousness (HCC) Active Problems:   Provoked seizures (HCC)   Encephalopathy acute   DNR (do not resuscitate)   Protein-calorie malnutrition, severe  Fall at home Traumatic right subdural hematoma/subarachnoid hemorrhage with midline shift due to fall and antiplatelets: Patient is on Plavix and aspirin.  Repeat CT with a stable hematoma.  MRI finding as above. -Initially admitted by neurosurgery, and declined surgical intervention. -Neurosurgery recommended holding Plavix and aspirin for 1 week -Outpatient follow-up with neurosurgery -PT/OT -Fall precaution  Provoked seizures due to the above: Seizures noted on EEG on 5/31.  LTM EEG with evidence of epileptogenicity arising from right central parietal  region but no active seizure.  -Neurology following. -Continue Keppra and Vimpat   Acute encephalopathy: Likely due to the above.  Seems to be resolving.  He is awake and alert and fairly oriented today.  No focal neurodeficit. -Reorientation and delirium precaution -Avoid or minimize sedating medications  Dysphagia: N.p.o. per SLP. -Continue TF via cortrak -SLP following -Monitor for refeeding syndrome  Left hip and back pain: Has bruising over left  hip.  Also limited ROM in left hip due to pain. -Obtain hip x-ray -Lidoderm patch to left hip  History of CAD: Stable -Holding Plavix and aspirin  History of CVA: -Holding Lasix and aspirin due to intracranial bleed  Controlled NIDDM-2 with hyperglycemia: A1c 6.0%.  Seems to be on metformin and Sitagliptin at home. Recent Labs  Lab 08/23/22 2044 08/23/22 2327 08/24/22 0419 08/24/22 0756  GLUCAP 139* 140* 173* 197*  -Continue SSI  Essential hypertension: BP slightly elevated.  Slightly tachycardic as well. -Start Coreg 6.25 mg twice daily.   -Continue holding amlodipine, lisinopril and metoprolol  Hypophosphatemia/hypokalemia: -Monitor and replenish as appropriate  Mood disorder: -Resume home Zoloft.  Severe malnutrition Body mass index is 24 kg/m. Nutrition Problem: Severe Malnutrition Etiology: chronic illness (fall) Signs/Symptoms: severe fat depletion, severe muscle depletion Interventions: Tube feeding, Prostat, MVI   DVT prophylaxis:  heparin injection 5,000 Units Start: 08/20/22 1400 SCDs Start: 08/18/22 2106  Code Status: DNR/DNI Family Communication: Updated patient's wife at bedside Level of care: Progressive Status is: Inpatient Remains inpatient appropriate because: Subdural hematoma, dysphagia and provoked seizure   Final disposition: SNF Consultants:  Neurosurgery admitted patient Pulmonology Neurology Palliative medicine  55 minutes with more than 50% spent in reviewing records, counseling patient/family and coordinating care.   Sch Meds:  Scheduled Meds:  Chlorhexidine Gluconate Cloth  6 each Topical Daily   feeding supplement (PROSource TF20)  60 mL Per Tube BID   heparin injection (subcutaneous)  5,000 Units Subcutaneous Q8H   lidocaine  1 patch Transdermal Q24H   multivitamin with minerals  1 tablet Per Tube Daily   mouth rinse  15 mL Mouth Rinse 4 times per day   thiamine  100 mg Per Tube Daily   Continuous Infusions:  sodium  chloride 75 mL/hr at 08/24/22 0747   feeding supplement (JEVITY 1.5 CAL/FIBER) 1,000 mL (08/23/22 1824)   lacosamide (VIMPAT) IV 100 mg (08/24/22 0947)   levETIRAcetam 1,500 mg (08/24/22 0912)   sodium phosphate 20 mmol in dextrose 5 % 250 mL infusion     PRN Meds:.acetaminophen (TYLENOL) oral liquid 160 mg/5 mL, [DISCONTINUED] acetaminophen **OR** acetaminophen, HYDROmorphone (DILAUDID) injection, labetalol, [DISCONTINUED] ondansetron **OR** ondansetron (ZOFRAN) IV, mouth rinse, mouth rinse  Antimicrobials: Anti-infectives (From admission, onward)    None        I have personally reviewed the following labs and images: CBC: Recent Labs  Lab 08/18/22 1612  WBC 7.3  NEUTROABS 4.9  HGB 9.9*  HCT 31.3*  MCV 94.8  PLT 297   BMP &GFR Recent Labs  Lab 08/18/22 1612 08/21/22 1433 08/23/22 1453 08/24/22 0600  NA 137 132*  --  136  K 3.9 4.5  --  3.5  CL 99 97*  --  103  CO2 25 27  --  23  GLUCOSE 148* 204*  --  195*  BUN 17 26*  --  34*  CREATININE 1.03 1.15  --  0.82  CALCIUM 8.6* 8.6*  --  8.5*  MG  --   --  1.9 2.0  PHOS  --   --  3.0 1.8*   Estimated Creatinine Clearance: 64.9 mL/min (by C-G formula based on SCr of 0.82 mg/dL). Liver & Pancreas: Recent Labs  Lab 08/21/22 1433  AST 19  ALT 18  ALKPHOS 93  BILITOT 0.8  PROT 6.4*  ALBUMIN 2.8*   No results for input(s): "LIPASE", "AMYLASE" in the last 168 hours. Recent Labs  Lab 08/21/22 1433  AMMONIA <10   Diabetic: Recent Labs    08/21/22 1612  HGBA1C 6.0*   Recent Labs  Lab 08/23/22 2044 08/23/22 2327 08/24/22 0419 08/24/22 0756  GLUCAP 139* 140* 173* 197*   Cardiac Enzymes: No results for input(s): "CKTOTAL", "CKMB", "CKMBINDEX", "TROPONINI" in the last 168 hours. No results for input(s): "PROBNP" in the last 8760 hours. Coagulation Profile: Recent Labs  Lab 08/18/22 1612  INR 1.1   Thyroid Function Tests: No results for input(s): "TSH", "T4TOTAL", "FREET4", "T3FREE", "THYROIDAB"  in the last 72 hours. Lipid Profile: Recent Labs    08/21/22 1432  LDLDIRECT 65   Anemia Panel: No results for input(s): "VITAMINB12", "FOLATE", "FERRITIN", "TIBC", "IRON", "RETICCTPCT" in the last 72 hours. Urine analysis:    Component Value Date/Time   COLORURINE YELLOW (A) 06/25/2022 1930   APPEARANCEUR CLEAR (A) 06/25/2022 1930   LABSPEC 1.023 06/25/2022 1930   PHURINE 5.0 06/25/2022 1930   GLUCOSEU 50 (A) 06/25/2022 1930   HGBUR NEGATIVE 06/25/2022 1930   BILIRUBINUR NEGATIVE 06/25/2022 1930   KETONESUR NEGATIVE 06/25/2022 1930   PROTEINUR 100 (A) 06/25/2022 1930   NITRITE NEGATIVE 06/25/2022 1930   LEUKOCYTESUR NEGATIVE 06/25/2022 1930   Sepsis Labs: Invalid input(s): "PROCALCITONIN", "LACTICIDVEN"  Microbiology: Recent Results (from the past 240 hour(s))  MRSA Next Gen by PCR, Nasal     Status: Abnormal   Collection Time: 08/18/22 11:45 PM   Specimen: Nasal Mucosa; Nasal Swab  Result Value Ref Range Status   MRSA by PCR Next Gen DETECTED (A) NOT DETECTED Final    Comment: RESULT CALLED TO, READ BACK BY AND VERIFIED WITH: DADI ALI RN 08/19/22 @ 0118 BY AB (NOTE) The GeneXpert MRSA Assay (FDA approved for NASAL specimens only), is one component of a comprehensive MRSA colonization surveillance program. It is not intended to diagnose MRSA infection nor to guide or monitor treatment for MRSA infections. Test performance is not FDA approved in patients less than 3 years old. Performed at [facility], [address], [place], [place] [address]     Radiology Studies: DG Abd Portable 1V  Result Date: 08/23/2022 CLINICAL DATA:  Feeding tube placement. EXAM: PORTABLE ABDOMEN - 1 VIEW COMPARISON:  None Available. FINDINGS: Feeding tube tip in the gastric antrum. The bowel gas pattern is normal. Oral contrast in the right colon. No radio-opaque calculi or other significant radiographic abnormality are seen. IMPRESSION: 1. Feeding tube tip in the gastric antrum.  Electronically Signed   By: Obie Dredge M.D.   On: 08/23/2022 12:46      Cassara Nida T. Emarie Paul [facility]  If 7PM-7AM, please contact night-coverage [email or web address] 08/24/2022, 10:57 AM

## 2022-08-24 NOTE — Progress Notes (Signed)
Occupational Therapy Treatment Patient Details Name: Raymond Sampson MRN: [id number] DOB: 10-23-1940 Today's Date: 08/24/2022   History of present illness 82 year old male admitted 08/18/22 after fall at home. CT head 5/18 revealed: large Right Subdural Hematoma, most pronounced along the anterior right frontal convexity.  Developed increased lethargy and change in pupilary size on 5/16 and MRI sowed multiple punctate areas of restricted diffusion in bilateral frontal and parietal cortex suspicious for tiny acute infarcts. LTM EEG positive for nonconvulsive seizures. Pt with history of CAD, prior stroke, hypertension, hyperlipidemia, type 2 diabetes, prior fall in March this year with SNF stay, chronic antiplatelet use with aspirin and Plavix.   OT comments  Patient more alert and increased ability to follow directions. Patient requiring +2 assist for bed mobility and to stand from EOB. Patient required mod assist for sitting balance with right lateral leaning initially and performed WB through LUE and demonstrated left lateral and posterior leaning. Patient demonstrating gains with following directions and is progressing towards other goals. Patient will benefit from continued inpatient follow up therapy, <3 hours/day. Acute OT to continue to follow.    Recommendations for follow up therapy are one component of a multi-disciplinary discharge planning process, led by the attending physician.  Recommendations may be updated based on patient status, additional functional criteria and insurance authorization.    Assistance Recommended at Discharge Frequent or constant Supervision/Assistance  Patient can return home with the following  Two people to help with walking and/or transfers;Two people to help with bathing/dressing/bathroom;Help with stairs or ramp for entrance;Assist for transportation;Direct supervision/assist for financial management;Assistance with feeding;Direct supervision/assist for  medications management;Assistance with cooking/housework   Equipment Recommendations  Other (comment) (defer)    Recommendations for Other Services      Precautions / Restrictions Precautions Precautions: Fall Precaution Comments: Seizures, NPO, LTM EEG Restrictions Weight Bearing Restrictions: No       Mobility Bed Mobility Overal bed mobility: Needs Assistance Bed Mobility: Supine to Sit, Sit to Supine, Rolling Rolling: Max assist, +2 for physical assistance   Supine to sit: Max assist, +2 for physical assistance, +2 for safety/equipment, HOB elevated Sit to supine: Max assist, +2 for physical assistance, +2 for safety/equipment, HOB elevated   General bed mobility comments: multimodal cues and demonstrated pain when assisting with LLE. Max assist +2 for trunk and LE assistance    Transfers Overall transfer level: Needs assistance Equipment used: 2 person hand held assist Transfers: Sit to/from Stand Sit to Stand: Max assist, +2 physical assistance, +2 safety/equipment           General transfer comment: Pt keeping his L hand on the bed surface the first rep but was able to grab therapist anterior to him with his L UE on the second rep. MaxAx2 to stand pt from EOB, providing verbal and tactile cues to extend hips and look up to improve his upright posture but pt maintained a flexed posture throughout. Pivoted pt's hips to L towards HOB on 2nd rep with total assist x2.     Balance Overall balance assessment: Needs assistance Sitting-balance support: Single extremity supported, Bilateral upper extremity supported, Feet supported, No upper extremity supported Sitting balance-Leahy Scale: Poor Sitting balance - Comments: Pt initially with a R lateral and posterior lean, needing cues and assistance to gain midline. Propped pt on L elbow for >30 sec then cued pt to sit upright and pt began to lean to L and anteriorly instead. ModA provided majority of time to prevent LOB with  noted  delay and poor reactional strategies. Postural control: Posterior lean, Right lateral lean (initially then left and anterior lean later) Standing balance support: Bilateral upper extremity supported Standing balance-Leahy Scale: Poor Standing balance comment: MaxAx2 and bil UE support to stand 2x ~5-20 sec, cuing pt to extend hips, legs, and trunk with poor success.                           ADL either performed or assessed with clinical judgement   ADL Overall ADL's : Needs assistance/impaired                                       General ADL Comments: focused on bed mobility, standing balance, and standing from EOB    Extremity/Trunk Assessment              Vision       Perception     Praxis      Cognition Arousal/Alertness: Awake/alert Behavior During Therapy: Flat affect Overall Cognitive Status: Impaired/Different from baseline Area of Impairment: Orientation, Attention, Memory, Following commands, Safety/judgement, Problem solving, Awareness                 Orientation Level: Disoriented to, Place Current Attention Level: Focused Memory: Decreased short-term memory Following Commands: Follows one step commands inconsistently, Follows one step commands with increased time Safety/Judgement: Decreased awareness of safety, Decreased awareness of deficits Awareness: Intellectual Problem Solving: Slow processing, Decreased initiation, Difficulty sequencing, Requires verbal cues, Requires tactile cues General Comments: believed he was in the [employer], when asked what he saw outside window he stated "barracks"        Exercises      Shoulder Instructions       General Comments      Pertinent Vitals/ Pain       Pain Assessment Pain Assessment: Faces Faces Pain Scale: Hurts little more Pain Location: "my back", but painful with leg movement (L>R) Pain Descriptors / Indicators: Discomfort, Grimacing, Guarding, Moaning Pain  Intervention(s): Limited activity within patient's tolerance, Monitored during session, Repositioned  Home Living                                          Prior Functioning/Environment              Frequency  Min 2X/week        Progress Toward Goals  OT Goals(current goals can now be found in the care plan section)  Progress towards OT goals: Progressing toward goals  Acute Rehab OT Goals Patient Stated Goal: get stronger OT Goal Formulation: With family Time For Goal Achievement: 09/05/22 Potential to Achieve Goals: Fair ADL Goals Pt Will Perform Grooming: with mod assist;bed level Additional ADL Goal #1: Pt will follow 1 step commands with 50% accuracy given increased time. Additional ADL Goal #2: Pt will complete bed mobility with max assist as precursor to ADLs.  Plan Discharge plan remains appropriate    Co-evaluation    PT/OT/SLP Co-Evaluation/Treatment: Yes Reason for Co-Treatment: Necessary to address cognition/behavior during functional activity;For patient/therapist safety;To address functional/ADL transfers   OT goals addressed during session: ADL's and self-care      AM-PAC OT "6 Clicks" Daily Activity     Outcome Measure   Help from another person eating meals?:  Total Help from another person taking care of personal grooming?: A Lot Help from another person toileting, which includes using toliet, bedpan, or urinal?: Total Help from another person bathing (including washing, rinsing, drying)?: Total Help from another person to put on and taking off regular upper body clothing?: Total Help from another person to put on and taking off regular lower body clothing?: Total 6 Click Score: 7    End of Session Equipment Utilized During Treatment: Gait belt  OT Visit Diagnosis: Other abnormalities of gait and mobility (R26.89);Muscle weakness (generalized) (M62.81);Other symptoms and signs involving cognitive function;Other symptoms and  signs involving the nervous system (R29.898)   Activity Tolerance Patient tolerated treatment well   Patient Left in bed;with call bell/phone within reach;with bed alarm set   Nurse Communication Mobility status        Time: 4098-1191 OT Time Calculation (min): 25 min  Charges: OT General Charges $OT Visit: 1 Visit OT Treatments $Therapeutic Activity: 8-22 mins  Alfonse Flavors, OTA Acute Rehabilitation Services  Office [phone]   Dewain Penning 08/24/2022, 12:42 PM

## 2022-08-24 NOTE — Progress Notes (Addendum)
Subjective: No acute events overnight.  Per wife at bedside, was able to communicate a little bit earlier.  However when I examined, barely told me his name.  ROS: Unable to obtain due to poor mental status  Examination  Vital signs in last 24 hours: Temp:  [98.3 F (36.8 C)-98.7 F (37.1 C)] 98.3 F (36.8 C) (05/23 1145) Pulse Rate:  [79-109] 101 (05/23 1145) Resp:  [16-19] 18 (05/23 1145) BP: (139-172)/(63-85) 144/70 (05/23 1145) SpO2:  [94 %-100 %] 98 % (05/23 1145) Weight:  [69.5 kg] 69.5 kg (05/23 0500)  General: lying in bed, NAD Neuro: Opens eyes to repeated tactile and verbal stimulation, told me his name but did not answer other orientation questions, did stick out his tongue but did not follow other commands,, PERRLA, no forced gaze deviation, withdraws to noxious stimuli in all 4 extremities with left hemiparesis  Basic Metabolic Panel: Recent Labs  Lab 08/18/22 1612 08/21/22 1433 08/23/22 1453 08/24/22 0600  NA 137 132*  --  136  K 3.9 4.5  --  3.5  CL 99 97*  --  103  CO2 25 27  --  23  GLUCOSE 148* 204*  --  195*  BUN 17 26*  --  34*  CREATININE 1.03 1.15  --  0.82  CALCIUM 8.6* 8.6*  --  8.5*  MG  --   --  1.9 2.0  PHOS  --   --  3.0 1.8*    CBC: Recent Labs  Lab 08/18/22 1612  WBC 7.3  NEUTROABS 4.9  HGB 9.9*  HCT 31.3*  MCV 94.8  PLT 297     Coagulation Studies: No results for input(s): "LABPROT", "INR" in the last 72 hours.  Imaging No new brain imaging overnight  ASSESSMENT AND PLAN: 82 year old male with right subdural hematoma secondary to fall now with seizures.    Traumatic subdural hematoma Provoked seizures -Seizures most likely secondary to underlying subdural hematoma   Recommendations -Reduce Keppra to 1000 mg twice daily to minimize sedation -Continue Vimpat 100 mg twice daily -Continue LTM EEG overnight as we adjust anti seizure medications -Continue seizure precautions -As needed IV Versed for clinical  seizures -Discussed plan with family at bedside and Dr. Alanda Slim via secure chat   I have spent a total of  36 minutes with the patient reviewing hospital notes,  test results, labs and examining the patient as well as establishing an assessment and plan.  > 50% of time was spent in direct patient care.    Lindie Spruce Epilepsy [facility] For questions after 5pm please refer to AMION to reach the Neurologist on call

## 2022-08-24 NOTE — Procedures (Addendum)
Patient Name: Raymond Sampson  MRN: [id number]  Epilepsy Attending: Charlsie Raymond  Referring Physician/Provider: Jefferson Fuel, MD  Duration: 08/23/2019 1750 to 08/24/2022 1750   Patient history: 82 y.o. male with history of CAD, stroke, diabetes, hyperlipidemia, hypertension and paroxysmal atrial tachycardia who presented originally after falling off of 1 step off his deck at home and having a head injury with a large right-sided subdural hematoma.  He was initially alert and oriented and mentally at his baseline, but yesterday around 1300, he became more drowsy and was only able to intermittently follow commands and intermittently answer questions. EEG to evaluate for seizure.   Level of alertness: Awake, asleep   AEDs during EEG study: LEV, LCM   Technical aspects: This EEG study was done with scalp electrodes positioned according to the 10-20 International system of electrode placement. Electrical activity was reviewed with band pass filter of 1-70Hz , sensitivity of 7 uV/mm, display speed of 57mm/sec with a 60Hz  notched filter applied as appropriate. EEG data were recorded continuously and digitally stored.  Video monitoring was available and reviewed as appropriate.   Description: The posterior dominant rhythm consists of 8-9 Hz activity of moderate voltage (25-35 uV) seen predominantly in posterior head regions, symmetric and reactive to eye opening and eye closing. Sleep was characterized by vertex waves, sleep spindles (12 to 14 Hz), maximal frontocentral region.  EEG showed intermittent generalized and lateralized right hemisphere 3-7 theta-delta slowing. Sharp waves were noted in right centro-parietal region. Hyperventilation and photic stimulation were not performed.      ABNORMALITY - Sharp waves,  right centro-parietal region.  - Intermittent slow, generalized and lateralized right hemisphere   IMPRESSION: This study showed evidence of epileptogenicity arising from right  centro-parietal region. Additionally, there is evidence of cortical dysfunction in right hemisphere likely secondary to underlying structural abnormality.  Lastly there is mild diffuse encephalopathy. No seizures were noted.   Nickolas Chalfin Annabelle Harman

## 2022-08-24 NOTE — Progress Notes (Signed)
Physical Therapy Treatment Patient Details Name: Raymond Sampson MRN: [id number] DOB: 08-17-1940 Today's Date: 08/24/2022   History of Present Illness 82 year old male admitted 08/18/22 after fall at home. CT head 5/18 revealed: large Right Subdural Hematoma, most pronounced along the anterior right frontal convexity.  Developed increased lethargy and change in pupilary size on 5/16 and MRI sowed multiple punctate areas of restricted diffusion in bilateral frontal and parietal cortex suspicious for tiny acute infarcts. LTM EEG positive for nonconvulsive seizures. Pt with history of CAD, prior stroke, hypertension, hyperlipidemia, type 2 diabetes, prior fall in March this year with SNF stay, chronic antiplatelet use with aspirin and Plavix.    PT Comments    Pt was more awake and communicating today with eyes partially open throughout session. He tends to keep an anterior gaze but displays deficits in sequencing, problem-solving, memory, and awareness. In addition, he displays gross overall weakness and balance deficits, needing repeated multi-modal cues and modA for static sitting balance at EOB and maxAx2 for all bed mobility and transfers at this time. Noted edema and bruising at L hip/superior thigh and apparent pain with palpation and movement of leg even though pt stated his pain was at his back. Notified RN and MD. Will continue to follow acutely.     Recommendations for follow up therapy are one component of a multi-disciplinary discharge planning process, led by the attending physician.  Recommendations may be updated based on patient status, additional functional criteria and insurance authorization.  Follow Up Recommendations  Can patient physically be transported by private vehicle: No    Assistance Recommended at Discharge Frequent or constant Supervision/Assistance  Patient can return home with the following Two people to help with walking and/or transfers;Two people to help with  bathing/dressing/bathroom;Assist for transportation;Help with stairs or ramp for entrance;Direct supervision/assist for medications management;Direct supervision/assist for financial management;Assistance with cooking/housework;Assistance with feeding   Equipment Recommendations  Wheelchair (measurements PT);Wheelchair cushion (measurements PT);Hospital bed;Other (comment) (hoyer lift; if goes home instead)    Recommendations for Other Services       Precautions / Restrictions Precautions Precautions: Fall Precaution Comments: Seizures, NPO, LTM EEG Restrictions Weight Bearing Restrictions: No     Mobility  Bed Mobility Overal bed mobility: Needs Assistance Bed Mobility: Supine to Sit, Sit to Supine, Rolling Rolling: Max assist, +2 for physical assistance   Supine to sit: Max assist, +2 for physical assistance, +2 for safety/equipment, HOB elevated Sit to supine: Max assist, +2 for physical assistance, +2 for safety/equipment, HOB elevated   General bed mobility comments: Pt provided step-by-step multi-modal cues to bring each legs towards and off L EOB and cues to reach across to therapist on his L with his R UE to pull up to sit, maxAx2. MaxAx2 to control trunk and lift legs back to supine with cues to lean onto L elbow to control descent. MaxAx2 to roll to R 1x with cues to pull with L UE on therapist on his R.    Transfers Overall transfer level: Needs assistance Equipment used: 2 person hand held assist Transfers: Sit to/from Stand Sit to Stand: Max assist, +2 physical assistance, +2 safety/equipment           General transfer comment: Pt keeping his L hand on the bed surface the first rep but was able to grab therapist anterior to him with his L UE on the second rep. MaxAx2 to stand pt from EOB, providing verbal and tactile cues to extend hips and look up to improve his  upright posture but pt maintained a flexed posture throughout. Pivoted pt's hips to L towards HOB on 2nd  rep with total assist x2.    Ambulation/Gait               General Gait Details: unable   Stairs             Wheelchair Mobility    Modified Rankin (Stroke Patients Only) Modified Rankin (Stroke Patients Only) Pre-Morbid Rankin Score: Moderate disability Modified Rankin: Severe disability     Balance Overall balance assessment: Needs assistance Sitting-balance support: Single extremity supported, Bilateral upper extremity supported, Feet supported, No upper extremity supported Sitting balance-Leahy Scale: Poor Sitting balance - Comments: Pt initially with a R lateral and posterior lean, needing cues and assistance to gain midline. Propped pt on L elbow for >30 sec then cued pt to sit upright and pt began to lean to L and anteriorly instead. ModA provided majority of time to prevent LOB with noted delay and poor reactional strategies. Postural control: Posterior lean, Right lateral lean (initially then L and anterior lean later) Standing balance support: Bilateral upper extremity supported Standing balance-Leahy Scale: Poor Standing balance comment: MaxAx2 and bil UE support to stand 2x ~5-20 sec, cuing pt to extend hips, legs, and trunk with poor success.                            Cognition Arousal/Alertness: Awake/alert Behavior During Therapy: Flat affect Overall Cognitive Status: Impaired/Different from baseline Area of Impairment: Orientation, Attention, Memory, Following commands, Safety/judgement, Problem solving, Awareness                 Orientation Level: Disoriented to, Place (at least to place, did not ask about date or situation) Current Attention Level: Focused Memory: Decreased short-term memory Following Commands: Follows one step commands inconsistently, Follows one step commands with increased time Safety/Judgement: Decreased awareness of safety, Decreased awareness of deficits Awareness: Intellectual Problem Solving: Slow  processing, Decreased initiation, Difficulty sequencing, Requires verbal cues, Requires tactile cues General Comments: Pt more awake and responding verbally to questions with delay. Eyes open partially throughout session, but tends to keep a forward gaze, yet able to track therapist to the L at least. Poor sequencing and problem-solving with mobility, needing step-by-step multi-modal cues and assistance to initiate all movement. Poor recall of cues, asking "which way?" to move his legs even though just instructed a moment prior.        Exercises General Exercises - Lower Extremity Long Arc Quad: AAROM, Both, Other reps (comment), Seated (x3)    General Comments        Pertinent Vitals/Pain Pain Assessment Pain Assessment: Faces Faces Pain Scale: Hurts little more Pain Location: "my back", but painful with leg movement (L>R) Pain Descriptors / Indicators: Discomfort, Grimacing, Guarding, Moaning Pain Intervention(s): Limited activity within patient's tolerance, Monitored during session, Repositioned    Home Living                          Prior Function            PT Goals (current goals can now be found in the care plan section) Acute Rehab PT Goals Patient Stated Goal: per family, to improve PT Goal Formulation: With patient/family Time For Goal Achievement: 09/05/22 Potential to Achieve Goals: Fair Progress towards PT goals: Progressing toward goals    Frequency    Min 3X/week  PT Plan Current plan remains appropriate    Co-evaluation PT/OT/SLP Co-Evaluation/Treatment: Yes Reason for Co-Treatment: Necessary to address cognition/behavior during functional activity;For patient/therapist safety;To address functional/ADL transfers PT goals addressed during session: Mobility/safety with mobility;Balance;Strengthening/ROM        AM-PAC PT "6 Clicks" Mobility   Outcome Measure  Help needed turning from your back to your side while in a flat bed  without using bedrails?: Total Help needed moving from lying on your back to sitting on the side of a flat bed without using bedrails?: Total Help needed moving to and from a bed to a chair (including a wheelchair)?: Total Help needed standing up from a chair using your arms (e.g., wheelchair or bedside chair)?: Total Help needed to walk in hospital room?: Total Help needed climbing 3-5 steps with a railing? : Total 6 Click Score: 6    End of Session Equipment Utilized During Treatment: Gait belt Activity Tolerance: Patient tolerated treatment well Patient left: in bed;with call bell/phone within reach;with bed alarm set Nurse Communication: Other (comment) (bruising and swelling noted L hip/thigh - notified MD also) PT Visit Diagnosis: Other symptoms and signs involving the nervous system (R29.898);Muscle weakness (generalized) (M62.81);Other abnormalities of gait and mobility (R26.89);Unsteadiness on feet (R26.81);Difficulty in walking, not elsewhere classified (R26.2)     Time: 1610-9604 PT Time Calculation (min) (ACUTE ONLY): 25 min  Charges:  $Therapeutic Activity: 8-22 mins                     Raymond Gurney, PT, DPT Acute Rehabilitation Services  Office: [phone]    Jewel Baize 08/24/2022, 8:34 AM

## 2022-08-24 NOTE — Progress Notes (Signed)
Daily Progress Note   Patient Name: Raymond Sampson       Date: 08/24/2022 DOB: June 28, 1940  Age: 82 y.o. MRN#: [id number] Attending Physician: Almon Hercules, MD Primary Care Physician: Gracelyn Nurse, MD Admit Date: 08/18/2022  Reason for Consultation/Follow-up: Establishing goals of care  Subjective: The patient was sitting upright in bed sleeping. The patient's wife [name] was at bedside.  Length of Stay: 6  Current Medications: Scheduled Meds:   Chlorhexidine Gluconate Cloth  6 each Topical Daily   feeding supplement (PROSource TF20)  60 mL Per Tube BID   heparin injection (subcutaneous)  5,000 Units Subcutaneous Q8H   multivitamin with minerals  1 tablet Per Tube Daily   mouth rinse  15 mL Mouth Rinse 4 times per day   thiamine  100 mg Per Tube Daily    Continuous Infusions:  sodium chloride 75 mL/hr at 08/24/22 0747   feeding supplement (JEVITY 1.5 CAL/FIBER) 1,000 mL (08/23/22 1824)   lacosamide (VIMPAT) IV 100 mg (08/24/22 0947)   levETIRAcetam 1,500 mg (08/24/22 0912)   sodium phosphate 20 mmol in dextrose 5 % 250 mL infusion      PRN Meds: acetaminophen (TYLENOL) oral liquid 160 mg/5 mL, [DISCONTINUED] acetaminophen **OR** acetaminophen, HYDROmorphone (DILAUDID) injection, labetalol, [DISCONTINUED] ondansetron **OR** ondansetron (ZOFRAN) IV, mouth rinse, mouth rinse  Physical Exam Vitals reviewed.  Constitutional:      Appearance: He is ill-appearing.  HENT:     Head:     Comments: Bandage on left face/temple Pulmonary:     Effort: Pulmonary effort is normal.  Skin:    Findings: Bruising (left forehead/temple) present.             Vital Signs: BP (!) 157/78 (BP Location: Right Arm)   Pulse (!) 102   Temp 98.5 F (36.9 C) (Oral)   Resp 18   Ht 5\' 7"   (1.702 m)   Wt 69.5 kg   SpO2 98%   BMI 24.00 kg/m  SpO2: SpO2: 98 % O2 Device: O2 Device: Room Air O2 Flow Rate: O2 Flow Rate (L/min): 2 L/min  Intake/output summary:  Intake/Output Summary (Last 24 hours) at 08/24/2022 1010 Last data filed at 08/24/2022 0358 Gross per 24 hour  Intake 1082.88 ml  Output 750 ml  Net 332.88 ml   LBM: Last BM Date :  (  pta) Baseline Weight: Weight: 65.2 kg Most recent weight: Weight: 69.5 kg       Palliative Assessment/Data: 20%      Patient Active Problem List   Diagnosis Date Noted   Protein-calorie malnutrition, severe 08/24/2022   Provoked seizures (HCC) 08/23/2022   Encephalopathy acute 08/23/2022   DNR (do not resuscitate) 08/23/2022   Traumatic subdural hematoma without loss of consciousness (HCC) 08/18/2022   Fall 06/22/2022   Malnutrition of moderate degree 06/22/2022   Acute blood loss anemia 06/21/2022   Multiple rib fractures involving four or more ribs, right side 06/20/2022   Intramuscular hematoma 06/19/2022   Rib fractures 06/19/2022   Hemothorax on right 06/19/2022   Leukocytosis 06/19/2022   Status post coronary artery stent placement 05/02/2022   Mild dementia (HCC) 04/14/2022   Non-ST elevation (NSTEMI) myocardial infarction (HCC) 04/13/2022   History of COVID-19 04/13/2022   Lung nodule 04/13/2022   PAD (peripheral artery disease) (HCC) 04/13/2022   Flu 06/05/2017   Diabetes mellitus type 2, controlled, with complications (HCC) 04/06/2015   Carotid stenosis 05/14/2013   CAROTID BRUIT, LEFT 04/25/2010   PALPITATIONS 06/18/2009   Hyperlipidemia 04/27/2009   Essential hypertension 04/27/2009   Coronary artery disease of native artery of native heart with stable angina pectoris (HCC) 04/27/2009    Palliative Care Assessment & Plan   Patient Profile: 82 y.o. male with past medical history of CAD, stroke, DM2, HTN, HLD admitted on 08/18/2022 after a fall at his home with a large right-sided subdural hematoma. He  was initially alert and oriented but on 08/21/22 he he became more drowsy and was only able to intermittently follow commands and intermittently answer questions. EEG to evaluate for seizure.   Assessment: Patient was sleeping. He briefly woke up and said hello. His wife states he is more clear than yesterday. She is hopeful he will keep improving. He has a cortrak now for nutrition. We discussed that the cortrak is temporary and she understands. We began discussing hoping for the best and preparing for the worst but she was not in a space to discuss this today.   I told her I would allow her space and connect with her on Sunday.  Recommendations/Plan: DNR Allow time for outcomes Encourage family to continue conversations around GOC  Goals of Care and Additional Recommendations: Limitations on Scope of Treatment: Use MOST form for guidance  Code Status:    Code Status Orders  (From admission, onward)           Start     Ordered   08/19/22 0022  Do not attempt resuscitation (DNR)  Continuous       Question Answer Comment  If patient has no pulse and is not breathing Do Not Attempt Resuscitation   If patient has a pulse and/or is breathing: Medical Treatment Goals COMFORT MEASURES: Keep clean/warm/dry, use medication by any route; positioning, wound care and other measures to relieve pain/suffering; use oxygen, suction/manual treatment of airway obstruction for comfort; do not transfer unless for comfort needs.   Consent: Discussion documented in EHR or advanced directives reviewed      05 /18/24 0023           Code Status History     Date Active Date Inactive Code Status Order ID Comments User Context   08/18/2022 2105 08/19/2022 0023 Full Code [id number]  Vonna Drafts ED   06/28/2022 1422 07/03/2022 2122 DNR [id number]  Morton Stall, NP Inpatient   06/19/2022 1721 06/28/2022 1422 Full  Code [id number]  Floydene Flock, MD ED   05/02/2022 1135 05/03/2022 1523 Full Code  [id number]  Yvonne Kendall, MD Inpatient   04/13/2022 0048 04/15/2022 2115 Full Code [id number]  Nolberto Hanlon, MD ED   06/05/2017 2015 06/07/2017 1757 Full Code [id number]  Ihor Austin, MD Inpatient       Prognosis:  Unable to determine  Discharge Planning: To Be Determined  Time spent: 30 minutes   Detailed review of medical records ( labs, imaging, vital signs), medically appropriate exam, counseling and education to care partner, documenting clinical information, medication management, coordination of care.   Thank you for allowing the Palliative Medicine Team to assist in the care of this patient.   Sherryll Burger, NP  Please contact Palliative Medicine Team phone at [phone] for questions and concerns.

## 2022-08-24 NOTE — Progress Notes (Signed)
LTM maint complete - no skin breakdown under: Fp1 Fp2 Serviced  Fp2 [facility] monitored, Event button test confirmed by [facility].  

## 2022-08-24 NOTE — Progress Notes (Signed)
Speech Language Pathology Treatment: Dysphagia  Patient Details Name: Raymond Sampson MRN: [id number] DOB: 1940/11/13 Today's Date: 08/24/2022 Time: 4098-1191 SLP Time Calculation (min) (ACUTE ONLY): 13 min  Assessment / Plan / Recommendation Clinical Impression  Pt continues to present with a severe oropharyngeal dysphagia.  RN reports pt more alert this morning, but pt was difficult to  rouse for PO trials today.  Wife reports pt has recent administration of medications. Today there was cough noted following about half of trials.  Laryngeal elevation on palpation was improved as compared to 5/22.  Pt remains on continuous EEG and is unable to participate in repeat instrumental study at this time 2/2 decreased LOA.  Pt now has cortrak in place.  SLP will continue to follow for PO readiness.  Recommend pt remain NPO with alternate means of nutrition, hydration, and medication. Pt may have small amounts of water by spoon in moderation, after good oral care, when fully awake/alert, with upright positioning and direct supervision.     HPI HPI: 82 year old male who presented for fall. Hit forehead with laceration repair, no LOC, no facial fx, but CT 5/18 revealed: "stable large, approximately 17 mm thick Right Subdural Hematoma, most pronounced along the anterior right frontal convexity. Stable small volume right hemisphere [facility]."  Chest CT 5/17: "Emphysema. Right pleural effusion. Hazy upper lobe  airspace disease is indeterminate for edema or pneumonia." Neuro change noted by RN around 1300 on 5/19 with workup ongoing at present. Pt with history of CAD, prior stroke, hypertension, hyperlipidemia, type 2 diabetes, chronic antiplatelet use with aspirin and Plavix. MBS revealed hardware from ACDF. Cortrak placed 5/22.      SLP Plan  Continue with current plan of care      Recommendations for follow up therapy are one component of a multi-disciplinary discharge planning process, led by the attending  physician.  Recommendations may be updated based on patient status, additional functional criteria and insurance authorization.    Recommendations  Diet recommendations: NPO Medication Administration: Via alternative means Compensations: Slow rate;Small sips/bites                  Oral care QID;Oral care prior to ice chip/H20   Frequent or constant Supervision/Assistance Dysphagia, oropharyngeal phase (R13.12)     Continue with current plan of care     Kerrie Pleasure, MA, CCC-SLP Acute Rehabilitation Services Office: [phone] 08/24/2022, 12:20 PM

## 2022-08-25 ENCOUNTER — Other Ambulatory Visit (HOSPITAL_COMMUNITY): Payer: Self-pay

## 2022-08-25 ENCOUNTER — Inpatient Hospital Stay (HOSPITAL_COMMUNITY): Payer: PPO

## 2022-08-25 DIAGNOSIS — S065XAA Traumatic subdural hemorrhage with loss of consciousness status unknown, initial encounter: Secondary | ICD-10-CM | POA: Diagnosis not present

## 2022-08-25 DIAGNOSIS — Z66 Do not resuscitate: Secondary | ICD-10-CM | POA: Diagnosis not present

## 2022-08-25 DIAGNOSIS — S065X0A Traumatic subdural hemorrhage without loss of consciousness, initial encounter: Secondary | ICD-10-CM | POA: Diagnosis not present

## 2022-08-25 DIAGNOSIS — R569 Unspecified convulsions: Secondary | ICD-10-CM | POA: Diagnosis not present

## 2022-08-25 DIAGNOSIS — D62 Acute posthemorrhagic anemia: Secondary | ICD-10-CM

## 2022-08-25 DIAGNOSIS — G9340 Encephalopathy, unspecified: Secondary | ICD-10-CM | POA: Diagnosis not present

## 2022-08-25 LAB — BPAM RBC
Blood Product Expiration Date: 202406222359
ISSUE DATE / TIME: 202405241146
ISSUE DATE / TIME: 202405241556
Unit Type and Rh: 5100

## 2022-08-25 LAB — IRON AND TIBC
Iron: 31 ug/dL — ABNORMAL LOW (ref 45–182)
Saturation Ratios: 13 % — ABNORMAL LOW (ref 17.9–39.5)
TIBC: 231 ug/dL — ABNORMAL LOW (ref 250–450)
UIBC: 200 ug/dL

## 2022-08-25 LAB — CBC
HCT: 20.6 % — ABNORMAL LOW (ref 39.0–52.0)
HCT: 26.4 % — ABNORMAL LOW (ref 39.0–52.0)
Hemoglobin: 6.5 g/dL — CL (ref 13.0–17.0)
Hemoglobin: 8.5 g/dL — ABNORMAL LOW (ref 13.0–17.0)
MCH: 29.8 pg (ref 26.0–34.0)
MCH: 29.2 pg (ref 26.0–34.0)
MCHC: 31.6 g/dL (ref 30.0–36.0)
MCHC: 32.2 g/dL (ref 30.0–36.0)
MCV: 94.5 fL (ref 80.0–100.0)
MCV: 90.7 fL (ref 80.0–100.0)
Platelets: 330 10*3/uL (ref 150–400)
Platelets: 296 10*3/uL (ref 150–400)
RBC: 2.18 MIL/uL — ABNORMAL LOW (ref 4.22–5.81)
RBC: 2.91 MIL/uL — ABNORMAL LOW (ref 4.22–5.81)
RDW: 15.9 % — ABNORMAL HIGH (ref 11.5–15.5)
RDW: 15.8 % — ABNORMAL HIGH (ref 11.5–15.5)
WBC: 8.4 10*3/uL (ref 4.0–10.5)
WBC: 8.9 10*3/uL (ref 4.0–10.5)
nRBC: 0 % (ref 0.0–0.2)
nRBC: 0.2 % (ref 0.0–0.2)

## 2022-08-25 LAB — RENAL FUNCTION PANEL
Albumin: 2.4 g/dL — ABNORMAL LOW (ref 3.5–5.0)
Anion gap: 10 (ref 5–15)
BUN: 35 mg/dL — ABNORMAL HIGH (ref 8–23)
CO2: 21 mmol/L — ABNORMAL LOW (ref 22–32)
Calcium: 8.4 mg/dL — ABNORMAL LOW (ref 8.9–10.3)
Chloride: 105 mmol/L (ref 98–111)
Creatinine, Ser: 0.85 mg/dL (ref 0.61–1.24)
GFR, Estimated: >60 mL/min (ref >60)
Glucose, Bld: 172 mg/dL — ABNORMAL HIGH (ref 70–99)
Phosphorus: 2.1 mg/dL — ABNORMAL LOW (ref 2.5–4.6)
Potassium: 3.8 mmol/L (ref 3.5–5.1)
Sodium: 136 mmol/L (ref 135–145)

## 2022-08-25 LAB — TYPE AND SCREEN
ABO/RH(D): O POS
Unit division: 0
Unit division: 0

## 2022-08-25 LAB — RETICULOCYTES
Immature Retic Fract: 24.9 % — ABNORMAL HIGH (ref 2.3–15.9)
RBC.: 2.17 MIL/uL — ABNORMAL LOW (ref 4.22–5.81)
Retic Count, Absolute: 93.1 10*3/uL (ref 19.0–186.0)
Retic Ct Pct: 4.3 % — ABNORMAL HIGH (ref 0.4–3.1)

## 2022-08-25 LAB — GLUCOSE, CAPILLARY
Glucose-Capillary: 155 mg/dL — ABNORMAL HIGH (ref 70–99)
Glucose-Capillary: 225 mg/dL — ABNORMAL HIGH (ref 70–99)
Glucose-Capillary: 184 mg/dL — ABNORMAL HIGH (ref 70–99)
Glucose-Capillary: 168 mg/dL — ABNORMAL HIGH (ref 70–99)
Glucose-Capillary: 193 mg/dL — ABNORMAL HIGH (ref 70–99)

## 2022-08-25 LAB — VITAMIN B12: Vitamin B-12: 1513 pg/mL — ABNORMAL HIGH (ref 180–914)

## 2022-08-25 LAB — FOLATE: Folate: 9.3 ng/mL (ref >5.9)

## 2022-08-25 LAB — MAGNESIUM: Magnesium: 1.9 mg/dL (ref 1.7–2.4)

## 2022-08-25 LAB — PHOSPHORUS: Phosphorus: 2.2 mg/dL — ABNORMAL LOW (ref 2.5–4.6)

## 2022-08-25 LAB — FERRITIN: Ferritin: 138 ng/mL (ref 24–336)

## 2022-08-25 LAB — PREPARE RBC (CROSSMATCH)

## 2022-08-25 MED ORDER — POTASSIUM PHOSPHATES 15 MMOLE/5ML IV SOLN
15.0000 mmol | Freq: Once | INTRAVENOUS | Status: AC
  Administered 2022-08-25: 15 mmol via INTRAVENOUS
  Filled 2022-08-25: qty 5

## 2022-08-25 MED ORDER — CARVEDILOL 12.5 MG PO TABS
12.5000 mg | ORAL_TABLET | Freq: Two times a day (BID) | ORAL | Status: DC
  Administered 2022-08-25 – 2022-09-21 (×55): 12.5 mg
  Filled 2022-08-25 (×55): qty 1

## 2022-08-25 MED ORDER — SODIUM CHLORIDE 0.9% IV SOLUTION: Freq: Once | INTRAVENOUS | Status: AC

## 2022-08-25 MED ORDER — CLONAZEPAM 0.5 MG PO TBDP: 2.0000 mg | ORAL_TABLET | ORAL | Status: DC | PRN

## 2022-08-25 MED ORDER — LABETALOL HCL 5 MG/ML IV SOLN
10.0000 mg | INTRAVENOUS | Status: DC | PRN
  Filled 2022-08-25: qty 4

## 2022-08-25 MED ORDER — INSULIN GLARGINE-YFGN 100 UNIT/ML ~~LOC~~ SOLN
10.0000 [IU] | Freq: Every day | SUBCUTANEOUS | Status: DC
  Administered 2022-08-25 – 2022-08-26 (×2): 10 [IU] via SUBCUTANEOUS
  Filled 2022-08-25 (×2): qty 0.1

## 2022-08-25 NOTE — Progress Notes (Signed)
Speech Language Pathology Treatment: Dysphagia  Patient Details Name: Raymond Sampson MRN: [id number] DOB: December 06, 1940 Today's Date: 08/25/2022 Time: 4098-1191 SLP Time Calculation (min) (ACUTE ONLY): 14 min  Assessment / Plan / Recommendation Clinical Impression  Pt seen for ongoing dysphagia management.  Pt alert and talking to wife on arrival. Pt has been requesting sprite.  Pt tolerated thin liquid by spoon.  With straw sips pt required 2 swallows per bolus.  There was delayed cough.  Pt requested additional sips of water with increased cough at a decreased delay.  Pt appears ready for repeat instrumental at this time now that he is more alert and seizures have been managed with medication.  Will plan for MBSS at 12:30.    HPI HPI: 82 year old male who presented for fall. Hit forehead with laceration repair, no LOC, no facial fx, but CT 5/18 revealed: "stable large, approximately 17 mm thick Right Subdural Hematoma, most pronounced along the anterior right frontal convexity. Stable small volume right hemisphere [facility]."  Chest CT 5/17: "Emphysema. Right pleural effusion. Hazy upper lobe  airspace disease is indeterminate for edema or pneumonia." Neuro change noted by RN around 1300 on 5/19 with workup revealing seizures, now controlled with medication. Pt with history of CAD, prior stroke, hypertension, hyperlipidemia, type 2 diabetes, chronic antiplatelet use with aspirin and Plavix. MBS revealed hardware from ACDF. Cortrak placed 5/22.      SLP Plan  Continue with current plan of care      Recommendations for follow up therapy are one component of a multi-disciplinary discharge planning process, led by the attending physician.  Recommendations may be updated based on patient status, additional functional criteria and insurance authorization.    Recommendations  Diet recommendations: NPO Medication Administration: Via alternative means                  Oral care QID;Oral care prior  to ice chip/H20   Frequent or constant Supervision/Assistance Dysphagia, oropharyngeal phase (R13.12)     Continue with current plan of care     Kerrie Pleasure, MA, CCC-SLP Acute Rehabilitation Services Office: [phone] 08/25/2022, 10:23 AM

## 2022-08-25 NOTE — TOC Benefit Eligibility Note (Signed)
Patient Product/process development scientist completed.    The patient is currently admitted and upon discharge could be taking Valtoco 15 mg dose.  The current 30 day co-pay is $220.62.   The patient is insured through [employer] Part D   This test claim was processed through [facility]- copay amounts may vary at other pharmacies due to pharmacy/plan contracts, or as the patient moves through the different stages of their insurance plan.  Roland Earl, CPHT Pharmacy Patient Advocate Specialist [facility] Patient Advocate Team Direct Number: [phone]  Fax: [phone]

## 2022-08-25 NOTE — Progress Notes (Signed)
Modified Barium Swallow Study  Patient Details  Name: Raymond Sampson MRN: [id number] Date of Birth: 04-07-1940  Today's Date: 08/25/2022  Modified Barium Swallow completed.  Full report located under Chart Review in the Imaging Section.  History of Present Illness 82 year old male who presented for fall. Hit forehead with laceration repair, no LOC, no facial fx, but CT 5/18 revealed: "stable large, approximately 17 mm thick Right Subdural Hematoma, most pronounced along the anterior right frontal convexity. Stable small volume right hemisphere [facility]."  Chest CT 5/17: "Emphysema. Right pleural effusion. Hazy upper lobe  airspace disease is indeterminate for edema or pneumonia." Neuro change noted by RN around 1300 on 5/19 with workup revealing seizures, now controlled with medication. Pt with history of CAD, prior stroke, hypertension, hyperlipidemia, type 2 diabetes, chronic antiplatelet use with aspirin and Plavix. MBS revealed hardware from ACDF. Cortrak placed 5/22.   Clinical Impression Pt was lethargic during assessment, but performance appears to have marginally improved.  He presented with persisting delays and diminished mobility of all the pharyngeal structures, leading to one incident of trace aspiration of thin liquids to just below the level of the vocal folds and intermittent trace penetration of thins and nectars.  He had no cough response in reaction to trace aspiration event. There was vallecular and pyriform residue with all consistencies.  Solids were not offered due to his mental status. Given his fluctuating mental status, he is not ready to resume an oral diet. Recommend cortrak feedings continue through the weekend. When he is alert and responsive, staff may offer honey-thick liquids and applesauce/pudding from floor stock.  SLP will continue to follow for readiness for a PO diet. Factors that may increase risk of adverse event in presence of aspiration Raymond Sampson & Raymond Sampson 2021):  Reduced cognitive function;Weak cough  Swallow Evaluation Recommendations Recommendations: NPO- may have honey-thick liquid and purees from floor stock when alert  Medication Administration: Via alternative means Supervision: Full assist for feeding Swallowing strategies  : Small bites/sips;Slow rate Postural changes: Position pt fully upright for meals Oral care recommendations: Oral care QID (4x/day);Oral care before ice chips/water     Raymond Sampson Raymond Sampson [name], MA CCC/SLP Clinical Specialist - Acute Care SLP Acute Rehabilitation Services Office number [phone]  [name] Raymond Sampson 08/25/2022,3:16 PM

## 2022-08-25 NOTE — Progress Notes (Signed)
EEG D/C'd. [facility] was notified. Patient did have mild skin break down under FP2 (above right eye) and PZ (crown on the head). Nurse manager notified.

## 2022-08-25 NOTE — Progress Notes (Signed)
PROGRESS NOTE  Raymond Sampson QMV:[id number] DOB: [birth year]   PCP: Gracelyn Nurse, MD  Patient is from: Home.  Lives with his wife.  Has rolling walker but does not use.  DOA: 08/18/2022 LOS: 7  Chief complaints Chief Complaint  Patient presents with   Fall on [place]inners     Brief Narrative / Interim history: 82 year old M with PMH of CAD, CVA, HTN, HLD, DM-2, cervical spine fusion and OA presented to ED after fall at home and found to have traumatic right frontal SDH measuring 2.2 cm in maximum thickness with 5 to 6 mm midline shift to the left, and trace SAH at the right temporal lobe.  Patient was on Plavix and aspirin.  CT cervical spine without acute finding but some sclerotic changes at C2-C3 facet joint.  He was admitted to ICU by neurosurgery. He was initially minimally symptomatic and refused surgery.  However, he developed intermittent focal seizure.   Repeat CT head on 5/18 with stable SDH and SAH.  MRI brain showed multiple small/punctate areas of restricted diffusion with bilateral frontal and parietal cortex suspicious for tiny acute infarcts or small intraparenchymal contusion and/or shear type injury in the setting of trauma.  EEG on 5/21 showed intermittent seizure.  Neurology consulted.  Patient was started on Keppra and Vimpat.  Palliative medicine consulted as well.  Patient was transferred to hospitalist service on 5/23.  Subsequent LTM EEG without seizure.  Neurology signed off.  Encephalopathy improved but seems to be delirious.  Remains NPO.  He is on tube feed via cortrack.     Subjective: Seen and examined earlier this morning.  No major events overnight of this morning.  He is somewhat confused and asking to go home.  Also restless.  Patient's wife at bedside.  He is awake but not alert.  He is oriented to self and his wife but not place or time.  Objective: Vitals:   08/25/22 0500 08/25/22 0910 08/25/22 1108 08/25/22 1145  BP:  (!) 155/66 127/63 127/89   Pulse:  90 85 90  Resp:  18 18 16   Temp:  (!) 97.5 F (36.4 C) 99.1 F (37.3 C) 97.9 F (36.6 C)  TempSrc:  Oral Oral Oral  SpO2:  96% 94% 95%  Weight: 69.2 kg     Height:        Examination:  GENERAL: Appears frail.  No apparent distress. HEENT: MMM.  Vision and hearing grossly intact.  NECK: Supple.  No apparent JVD.  RESP:  No IWOB.  Fair aeration bilaterally. CVS:  RRR. Heart sounds normal.  ABD/GI/GU: BS+. Abd soft, NTND.  MSK/EXT:   Moves extremities.  Significant muscle mass and subcu fat loss.  BLE weakness but symmetric. SKIN: Bruising over his left hip. NEURO: Awake.  Oriented to self and wife but not place or time.  Follows some commands.  BLE weakness. PSYCH: Somewhat confused.  Not agitated.  Procedures:  None  Microbiology summarized: MRSA PCR screen positive.  Assessment and plan: Principal Problem:   Traumatic subdural hematoma without loss of consciousness (HCC) Active Problems:   Provoked seizures (HCC)   Encephalopathy acute   DNR (do not resuscitate)   Protein-calorie malnutrition, severe  Fall at home Traumatic right subdural hematoma/subarachnoid hemorrhage with midline shift due to fall and antiplatelets: On Plavix and aspirin.  Repeat CT with a stable hematoma.  MRI finding as above. Initially admitted by neurosurgery, and declined surgical intervention. Neurosurgery recommended holding Plavix and aspirin for 1 week, and  outpatient follow-up. -Fall precaution, PT/OT  Provoked seizures due to the above: Seizures noted on EEG on 5/31.  Subsequent LTM EEG with evidence of epileptogenicity arising from right central parietal region but no active seizure.  -Neurology signed off.  Appreciate recommendations  -Keppra 1000 mg twice daily and Vimpat 100 mg twice daily  -Clonazepam ODT 2 mg 4 clinical seizure lasting more than 2 minutes   Acute encephalopathy: Likely due to the above.  He is awake and more alert today but only oriented to self and his  wife.  No focal neurodeficit.  Neurology decreased Keppra to 1000 mg twice daily. -Reorientation and delirium precaution -Avoid or minimize sedating medications  Dysphagia: N.p.o. per SLP. -Continue TF via cortrak -SLP following -Monitor for refeeding syndrome  Left hip and back pain: Has bruising over left hip.  Also limited ROM in left hip due to pain.  Left hip x-ray without acute finding. -Lidoderm patch to left hip  Acute blood loss anemia: Hgb dropped about 3 g.  Has some bruising on his left arm and left hip but no signs of hematoma.  No report of melena or hematochezia.  No significant iron deficiency on anemia panel. Recent Labs    06/25/22 0354 06/26/22 0404 06/27/22 0514 06/28/22 0453 06/29/22 0716 06/30/22 0540 07/01/22 0400 07/02/22 0439 08/18/22 1612 08/25/22 0635  HGB 9.2* 10.1* 9.7* 9.7* 10.0* 9.6* 9.6* 9.9* 9.9* 6.5*  -Discontinue subcu heparin.Marland Kitchen  SCD for VTE prophylaxis -Transfuse 2 units.  Risk and benefit discussed with patient's wife at bedside. -Monitor H&H.  Goal Hgb > 8.0 given CAD.  History of CAD/CVA: Stable -Continue holding Plavix and aspirin  Controlled NIDDM-2 with hyperglycemia: A1c 6.0%.  Seems to be on metformin and Sitagliptin at home. Recent Labs  Lab 08/24/22 2024 08/24/22 2331 08/25/22 0349 08/25/22 0912 08/25/22 1108  GLUCAP 165* 165* 155* 225* 184*  -Continue SSI-moderate every 4 hours -Add Semglee 10 units daily  Essential hypertension: Normotensive. -Continue Coreg 6.25 mg twice daily.   -Continue holding amlodipine, lisinopril and metoprolol  Hypophosphatemia/hypokalemia: -Monitor and replenish as appropriate  Mood disorder: -Resume home Zoloft.  Severe malnutrition Body mass index is 23.89 kg/m. Nutrition Problem: Severe Malnutrition Etiology: chronic illness (fall) Signs/Symptoms: severe fat depletion, severe muscle depletion Interventions: Tube feeding, Prostat, MVI   DVT prophylaxis:  Place and maintain  sequential compression device Start: 08/25/22 0839 SCDs Start: 08/18/22 2106  Code Status: DNR/DNI Family Communication: Updated patient's wife at bedside Level of care: Progressive Status is: Inpatient Remains inpatient appropriate because: Subdural hematoma, dysphagia, acute blood loss anemia and provoked seizure   Final disposition: SNF Consultants:  Neurosurgery admitted patient Pulmonology Neurology Palliative medicine  55 minutes with more than 50% spent in reviewing records, counseling patient/family and coordinating care.   Sch Meds:  Scheduled Meds:  atorvastatin  80 mg Per Tube Daily   carvedilol  6.25 mg Per Tube BID WC   Chlorhexidine Gluconate Cloth  6 each Topical Daily   feeding supplement (PROSource TF20)  60 mL Per Tube BID   insulin aspart  0-15 Units Subcutaneous Q4H   lidocaine  1 patch Transdermal Q24H   multivitamin with minerals  1 tablet Per Tube Daily   mouth rinse  15 mL Mouth Rinse 4 times per day   sertraline  25 mg Per Tube QPM   thiamine  100 mg Per Tube Daily   Continuous Infusions:  sodium chloride 75 mL/hr at 08/25/22 0625   feeding supplement (JEVITY 1.5 CAL/FIBER) 1,000 mL (  08/24/22 2231)   lacosamide (VIMPAT) IV 100 mg (08/25/22 1029)   levETIRAcetam 1,000 mg (08/25/22 0909)   potassium PHOSPHATE IVPB (in mmol) 15 mmol (08/25/22 1050)   PRN Meds:.acetaminophen (TYLENOL) oral liquid 160 mg/5 mL, [DISCONTINUED] acetaminophen **OR** acetaminophen, clonazepam, HYDROmorphone (DILAUDID) injection, labetalol, [DISCONTINUED] ondansetron **OR** ondansetron (ZOFRAN) IV, mouth rinse, mouth rinse  Antimicrobials: Anti-infectives (From admission, onward)    None        I have personally reviewed the following labs and images: CBC: Recent Labs  Lab 08/18/22 1612 08/25/22 0635  WBC 7.3 8.4  NEUTROABS 4.9  --   HGB 9.9* 6.5*  HCT 31.3* 20.6*  MCV 94.8 94.5  PLT 297 330   BMP &GFR Recent Labs  Lab 08/18/22 1612 08/21/22 1433  08/23/22 1453 08/24/22 0600 08/24/22 2050 08/25/22 0634 08/25/22 0635  NA 137 132*  --  136  --   --  136  K 3.9 4.5  --  3.5  --   --  3.8  CL 99 97*  --  103  --   --  105  CO2 25 27  --  23  --   --  21*  GLUCOSE 148* 204*  --  195*  --   --  172*  BUN 17 26*  --  34*  --   --  35*  CREATININE 1.03 1.15  --  0.82  --   --  0.85  CALCIUM 8.6* 8.6*  --  8.5*  --   --  8.4*  MG  --   --  1.9 2.0 1.6*  --  1.9  PHOS  --   --  3.0 1.8* 2.5 2.2* 2.1*   Estimated Creatinine Clearance: 62.6 mL/min (by C-G formula based on SCr of 0.85 mg/dL). Liver & Pancreas: Recent Labs  Lab 08/21/22 1433 08/25/22 0635  AST 19  --   ALT 18  --   ALKPHOS 93  --   BILITOT 0.8  --   PROT 6.4*  --   ALBUMIN 2.8* 2.4*   No results for input(s): "LIPASE", "AMYLASE" in the last 168 hours. Recent Labs  Lab 08/21/22 1433  AMMONIA <10   Diabetic: No results for input(s): "HGBA1C" in the last 72 hours.  Recent Labs  Lab 08/24/22 2024 08/24/22 2331 08/25/22 0349 08/25/22 0912 08/25/22 1108  GLUCAP 165* 165* 155* 225* 184*   Cardiac Enzymes: Recent Labs  Lab 08/24/22 0600  CKTOTAL 44*   No results for input(s): "PROBNP" in the last 8760 hours. Coagulation Profile: Recent Labs  Lab 08/18/22 1612  INR 1.1   Thyroid Function Tests: No results for input(s): "TSH", "T4TOTAL", "FREET4", "T3FREE", "THYROIDAB" in the last 72 hours. Lipid Profile: No results for input(s): "CHOL", "HDL", "LDLCALC", "TRIG", "CHOLHDL", "LDLDIRECT" in the last 72 hours.  Anemia Panel: Recent Labs    08/25/22 0635  VITAMINB12 1,513*  FOLATE 9.3  FERRITIN 138  TIBC 231*  IRON 31*  RETICCTPCT 4.3*   Urine analysis:    Component Value Date/Time   COLORURINE YELLOW (A) 06/25/2022 1930   APPEARANCEUR CLEAR (A) 06/25/2022 1930   LABSPEC 1.023 06/25/2022 1930   PHURINE 5.0 06/25/2022 1930   GLUCOSEU 50 (A) 06/25/2022 1930   HGBUR NEGATIVE 06/25/2022 1930   BILIRUBINUR NEGATIVE 06/25/2022 1930    KETONESUR NEGATIVE 06/25/2022 1930   PROTEINUR 100 (A) 06/25/2022 1930   NITRITE NEGATIVE 06/25/2022 1930   LEUKOCYTESUR NEGATIVE 06/25/2022 1930   Sepsis Labs: Invalid input(s): "PROCALCITONIN", "LACTICIDVEN"  Microbiology: Recent  Results (from the past 240 hour(s))  MRSA Next Gen by PCR, Nasal     Status: Abnormal   Collection Time: 08/18/22 11:45 PM   Specimen: Nasal Mucosa; Nasal Swab  Result Value Ref Range Status   MRSA by PCR Next Gen DETECTED (A) NOT DETECTED Final    Comment: RESULT CALLED TO, READ BACK BY AND VERIFIED WITH: DADI ALI RN 08/19/22 @ 0118 BY AB (NOTE) The GeneXpert MRSA Assay (FDA approved for NASAL specimens only), is one component of a comprehensive MRSA colonization surveillance program. It is not intended to diagnose MRSA infection nor to guide or monitor treatment for MRSA infections. Test performance is not FDA approved in patients less than 74 years old. Performed at [facility], [address], [place], [place] [address]     Radiology Studies: DG HIP UNILAT WITH PELVIS 2-3 VIEWS LEFT  Result Date: 08/24/2022 CLINICAL DATA:  Left hip pain.  Fall. EXAM: DG HIP (WITH OR WITHOUT PELVIS) 2-3V LEFT COMPARISON:  None Available. FINDINGS: Slightly obliqued view of the pelvis and frontal and lateral views of the left hip. Mildly decreased bone mineralization. Mild bilateral superior femoroacetabular joint space narrowing. Mild left femoral head-neck junction degenerative osteophytes. Within limitations of obliquity of the frontal view of the pelvis, no acute fracture is seen. No dislocation. There appears to be degenerative ankylosis of the bilateral sacroiliac joints and the pubic symphysis. Severe degenerative disc changes at L4-5 and L5-S1. IMPRESSION: 1. No acute fracture. 2. Mild left-greater-than-right femoroacetabular osteoarthritis. Electronically Signed   By: Neita Garnet M.D.   On: 08/24/2022 13:23      Breea Loncar T. Jackelyne Sayer [facility]  If  7PM-7AM, please contact night-coverage [email or web address] 08/25/2022, 12:01 PM

## 2022-08-25 NOTE — Progress Notes (Signed)
Physical Therapy Treatment Patient Details Name: Raymond Sampson MRN: [id number] DOB: [birth year] Today's Date: 08/25/2022   History of Present Illness 82 year old male admitted 08/18/22 after fall at home. CT head 5/18 revealed: large Right Subdural Hematoma, most pronounced along the anterior right frontal convexity.  Developed increased lethargy and change in pupilary size on 5/16 and MRI sowed multiple punctate areas of restricted diffusion in bilateral frontal and parietal cortex suspicious for tiny acute infarcts. LTM EEG positive for nonconvulsive seizures. Pt with history of CAD, prior stroke, hypertension, hyperlipidemia, type 2 diabetes, prior fall in March this year with SNF stay, chronic antiplatelet use with aspirin and Plavix.    PT Comments    Pt continues to demonstrate gradual improvement in participation and alertness. He is still requiring maxA for all bed mobility and transfers to stand from EOB to RW though due to deficits in strength, awareness of his leaning, problem-solving, sequencing, and initiation. Focused majority of session on facilitating bil upper and lower extremity muscular activation (delay with initiation noted) and midline alignment with static sitting. He tends to push himself over to his R with his L UE, but his midline balance improved briefly once propped on his L elbow. However, he does not like to prop on his L elbow due to fear of falling. Will continue to follow acutely.    Recommendations for follow up therapy are one component of a multi-disciplinary discharge planning process, led by the attending physician.  Recommendations may be updated based on patient status, additional functional criteria and insurance authorization.  Follow Up Recommendations  Can patient physically be transported by private vehicle: No    Assistance Recommended at Discharge Frequent or constant Supervision/Assistance  Patient can return home with the following Two people to  help with walking and/or transfers;Two people to help with bathing/dressing/bathroom;Assist for transportation;Help with stairs or ramp for entrance;Direct supervision/assist for medications management;Direct supervision/assist for financial management;Assistance with cooking/housework;Assistance with feeding   Equipment Recommendations  Wheelchair (measurements PT);Wheelchair cushion (measurements PT);Hospital bed;Other (comment) (hoyer lift; if goes home instead)    Recommendations for Other Services       Precautions / Restrictions Precautions Precautions: Fall Precaution Comments: Seizures, NPO, pushes self to R Restrictions Weight Bearing Restrictions: No     Mobility  Bed Mobility Overal bed mobility: Needs Assistance Bed Mobility: Supine to Sit, Sit to Supine     Supine to sit: Max assist, HOB elevated Sit to supine: Max assist, HOB elevated   General bed mobility comments: Pt provided step-by-step multi-modal cues to bring each legs towards and off L EOB and cues to reach across to therapist on his L with his R UE to pull up to sit, maxA. MaxA to control trunk and lift legs back to supine with cues to lean onto L elbow to control descent.    Transfers Overall transfer level: Needs assistance Equipment used: Rolling walker (2 wheels) Transfers: Sit to/from Stand Sit to Stand: Max assist           General transfer comment: R knee blocked and hand-over-hand guidance for R hand to be placed on RW but pt grabbing RW with L hand without guidance. MaxA to power up to stand, shift weight anteriorly, and extend trunk and legs.    Ambulation/Gait               General Gait Details: unable   Social research officer, government  Rankin (Stroke Patients Only) Modified Rankin (Stroke Patients Only) Pre-Morbid Rankin Score: Moderate disability Modified Rankin: Severe disability     Balance Overall balance assessment: Needs  assistance Sitting-balance support: Single extremity supported, Feet supported, No upper extremity supported Sitting balance-Leahy Scale: Poor Sitting balance - Comments: Pt pushing self to the R with his L UE despite max cues and education that he was leaning to his R, needing mod-maxA when he would lean to the R. Intermittently his midline alignment and balance would improve after being propped on his L elbow, progressing to min guard-minA with L hand in lap, but eventually he would begin to lean to the R again as he fatigued. Pt would resist propping on his L elbow as he reported fear of falling, needing encouragement. Postural control: Posterior lean, Right lateral lean Standing balance support: Bilateral upper extremity supported Standing balance-Leahy Scale: Poor Standing balance comment: MaxA and bil UE support to stand 1x ~10 sec                            Cognition Arousal/Alertness: Awake/alert Behavior During Therapy: Flat affect (occasional smile) Overall Cognitive Status: Impaired/Different from baseline Area of Impairment: Attention, Memory, Following commands, Safety/judgement, Problem solving, Awareness                   Current Attention Level: Focused Memory: Decreased short-term memory Following Commands: Follows one step commands inconsistently, Follows one step commands with increased time Safety/Judgement: Decreased awareness of safety, Decreased awareness of deficits Awareness: Intellectual Problem Solving: Slow processing, Decreased initiation, Difficulty sequencing, Requires verbal cues, Requires tactile cues General Comments: Tends to keep a forward gaze, but able to attend and look at therapist when cued to. Poor sequencing and problem-solving with mobility, needing step-by-step multi-modal cues and assistance to initiate all movement. Needs repetition of cues, seeming to forget task at hand and that he continues to push himself to the R. Poor  awareness of this and pt reporting he is fearful of leaning to the L due to fear of falling.        Exercises General Exercises - Upper Extremity Shoulder Flexion: AAROM, Both, 10 reps, Seated (</= 90') General Exercises - Lower Extremity Long Arc Quad: AAROM, Both, Seated, 5 reps    General Comments        Pertinent Vitals/Pain Pain Assessment Pain Assessment: Faces Faces Pain Scale: Hurts little more Pain Location: did not indicate, generalized Pain Descriptors / Indicators: Discomfort, Grimacing, Guarding, Moaning Pain Intervention(s): Limited activity within patient's tolerance, Monitored during session, Repositioned    Home Living                          Prior Function            PT Goals (current goals can now be found in the care plan section) Acute Rehab PT Goals Patient Stated Goal: to go home PT Goal Formulation: With patient/family Time For Goal Achievement: 09/05/22 Potential to Achieve Goals: Fair Progress towards PT goals: Progressing toward goals    Frequency    Min 3X/week      PT Plan Current plan remains appropriate    Co-evaluation              AM-PAC PT "6 Clicks" Mobility   Outcome Measure  Help needed turning from your back to your side while in a flat bed without using bedrails?: A Lot Help needed  moving from lying on your back to sitting on the side of a flat bed without using bedrails?: A Lot Help needed moving to and from a bed to a chair (including a wheelchair)?: Total Help needed standing up from a chair using your arms (e.g., wheelchair or bedside chair)?: A Lot Help needed to walk in hospital room?: Total Help needed climbing 3-5 steps with a railing? : Total 6 Click Score: 9    End of Session Equipment Utilized During Treatment: Gait belt Activity Tolerance: Patient tolerated treatment well Patient left: in bed;with call bell/phone within reach;with bed alarm set;with family/visitor present   PT Visit  Diagnosis: Other symptoms and signs involving the nervous system (R29.898);Muscle weakness (generalized) (M62.81);Other abnormalities of gait and mobility (R26.89);Unsteadiness on feet (R26.81);Difficulty in walking, not elsewhere classified (R26.2)     Time: 1610-9604 PT Time Calculation (min) (ACUTE ONLY): 29 min  Charges:  $Therapeutic Activity: 8-22 mins $Neuromuscular Re-education: 8-22 mins                     Raymond Gurney, PT, DPT Acute Rehabilitation Services  Office: [phone]    Jewel Baize 08/25/2022, 4:46 PM

## 2022-08-25 NOTE — Procedures (Signed)
Patient Name: Raymond Sampson  MRN: [id number]  Epilepsy Attending: Charlsie Quest  Referring Physician/Provider: Jefferson Fuel, MD  Duration: 08/24/2019 1750 to 08/25/2022 1008   Patient history: 82 y.o. male with history of CAD, stroke, diabetes, hyperlipidemia, hypertension and paroxysmal atrial tachycardia who presented originally after falling off of 1 step off his deck at home and having a head injury with a large right-sided subdural hematoma.  He was initially alert and oriented and mentally at his baseline, but yesterday around 1300, he became more drowsy and was only able to intermittently follow commands and intermittently answer questions. EEG to evaluate for seizure.   Level of alertness: Awake, asleep   AEDs during EEG study: LEV, LCM   Technical aspects: This EEG study was done with scalp electrodes positioned according to the 10-20 International system of electrode placement. Electrical activity was reviewed with band pass filter of 1-70Hz , sensitivity of 7 uV/mm, display speed of 87mm/sec with a 60Hz  notched filter applied as appropriate. EEG data were recorded continuously and digitally stored.  Video monitoring was available and reviewed as appropriate.   Description: The posterior dominant rhythm consists of 8-9 Hz activity of moderate voltage (25-35 uV) seen predominantly in posterior head regions, symmetric and reactive to eye opening and eye closing. Sleep was characterized by vertex waves, sleep spindles (12 to 14 Hz), maximal frontocentral region.  EEG showed intermittent generalized and lateralized right hemisphere 3-7 theta-delta slowing. Sharp waves were noted in right centro-parietal region. Hyperventilation and photic stimulation were not performed.      ABNORMALITY - Sharp waves,  right centro-parietal region.  - Intermittent slow, generalized and lateralized right hemisphere   IMPRESSION: This study showed evidence of epileptogenicity arising from right  centro-parietal region. Additionally, there is evidence of cortical dysfunction in right hemisphere likely secondary to underlying structural abnormality.  Lastly there is mild diffuse encephalopathy. No seizures were noted.   Leanah Kolander Annabelle Harman

## 2022-08-25 NOTE — Care Management Important Message (Signed)
Important Message  Patient Details  Name: NIVED SIMONICH MRN: [id number] Date of Birth: [birth year]   Medicare Important Message Given:  Yes     Ranald Alessio Stefan Church 08/25/2022, 3:35 PM

## 2022-08-25 NOTE — Progress Notes (Addendum)
Subjective: Raymond Sampson.  Denies any concerns.  Requesting to eat food and take the mittens off.  ROS: negative except above  Examination  Vital signs in last 24 hours: Temp:  [98 F (36.7 C)-98.5 F (36.9 C)] 98.5 F (36.9 C) (05/24 0450) Pulse Rate:  [79-102] 88 (05/24 0450) Resp:  [16-18] 17 (05/24 0450) BP: (123-166)/(59-81) 162/72 (05/24 0450) SpO2:  [98 %-100 %] 99 % (05/24 0450) Weight:  [69.2 kg] 69.2 kg (05/24 0500)  General: lying in bed, NAD Neuro: MS: Alert, oriented, follows commands CN: pupils equal and reactive,  EOMI, face symmetric, tongue midline, normal sensation over face, Motor: 4/5 strength in all 4 extremities Coordination: normal Gait: not tested  Basic Metabolic Panel: Recent Labs  Lab 08/18/22 1612 08/21/22 1433 08/23/22 1453 08/24/22 0600 08/24/22 2050  NA 137 132*  --  136  --   K 3.9 4.5  --  3.5  --   CL 99 97*  --  103  --   CO2 25 27  --  23  --   GLUCOSE 148* 204*  --  195*  --   BUN 17 26*  --  34*  --   CREATININE 1.03 1.15  --  0.82  --   CALCIUM 8.6* 8.6*  --  8.5*  --   MG  --   --  1.9 2.0 1.6*  PHOS  --   --  3.0 1.8* 2.5    CBC: Recent Labs  Lab 08/18/22 1612  WBC 7.3  NEUTROABS 4.9  HGB 9.9*  HCT 31.3*  MCV 94.8  PLT 297     Coagulation Studies: No results for input(s): "LABPROT", "INR" in the last 72 hours.  Imaging No new brain imaging overnight   ASSESSMENT AND PLAN: 82 year old male with right subdural hematoma secondary to fall now with seizures.    Traumatic subdural hematoma Provoked seizures -Seizures most likely secondary to underlying subdural hematoma   Recommendations -Continue Keppra to 1000 mg twice daily and Vimpat 100 mg twice daily -Next medication: clonazepam oral dissolving tablet 2 mg for seizure clinical seizure lasting more than 2 minutes.  Intranasal Valtoco not covered by insurance -Discontinue LTM EEG as no further seizures overnight -Swallow evaluation, PT/OT -I will schedule  follow-up with Dr. Teresa Coombs in 4 weeks.  Of note, seizures happen within the first 7 days (provoked seizures).  Therefore patient may be able to come off of antiseizure medications eventually in future -Continue seizure precautions -As needed IV Versed for clinical seizures -Discussed plan with family at bedside and Dr. Alanda Slim via secure chat  Seizure precautions: Per [facility] statutes, patients with seizures are not allowed to drive until they have been seizure-free for six months and cleared by a physician    Use caution when using heavy equipment or power tools. Avoid working on ladders or at heights. Take showers instead of baths. Ensure the water temperature is not too high on the home water heater. Do not go swimming alone. Do not lock yourself in a room alone (i.e. bathroom). When caring for infants or small children, sit down when holding, feeding, or changing them to minimize risk of injury to the child in the event you have a seizure. Maintain good sleep hygiene. Avoid alcohol.    If patient has another seizure, call 911 and bring them back to the ED if: A.  The seizure lasts longer than 5 minutes.      B.  The patient doesn't wake shortly after  the seizure or has new problems such as difficulty seeing, speaking or moving following the seizure C.  The patient was injured during the seizure D.  The patient has a temperature over 102 F (39C) E.  The patient vomited during the seizure and now is having trouble breathing    During the Seizure   - First, ensure adequate ventilation and place patients on the floor on their left side  Loosen clothing around the neck and ensure the airway is patent. If the patient is clenching the teeth, do not force the mouth open with any object as this can cause severe damage - Remove all items from the surrounding that can be hazardous. The patient may be oblivious to what's happening and may not even know what he or she is doing. If the patient is  confused and wandering, either gently guide him/her away and block access to outside areas - Reassure the individual and be comforting - Call 911. In most cases, the seizure ends before EMS arrives. However, there are cases when seizures may last over 3 to 5 minutes. Or the individual may have developed breathing difficulties or severe injuries. If a pregnant patient or a person with diabetes develops a seizure, it is prudent to call an ambulance. - Finally, if the patient does not regain full consciousness, then call EMS. Most patients will remain confused for about 45 to 90 minutes after a seizure, so you must use judgment in calling for help.    After the Seizure (Postictal Stage)   After a seizure, most patients experience confusion, fatigue, muscle pain and/or a headache. Thus, one should permit the individual to sleep. For the next few days, reassurance is essential. Being calm and helping reorient the person is also of importance.   Most seizures are painless and end spontaneously. Seizures are not harmful to others but can lead to complications such as stress on the lungs, brain and the heart. Individuals with prior lung problems may develop labored breathing and respiratory distress.     I have spent a total of  36 minutes with the patient reviewing hospital notes,  test results, labs and examining the patient as well as establishing an assessment and plan.  > 50% of time was spent in direct patient care.  Lindie Spruce Epilepsy [facility] For questions after 5pm please refer to AMION to reach the Neurologist on call

## 2022-08-26 DIAGNOSIS — R569 Unspecified convulsions: Secondary | ICD-10-CM | POA: Diagnosis not present

## 2022-08-26 DIAGNOSIS — S065X0A Traumatic subdural hemorrhage without loss of consciousness, initial encounter: Secondary | ICD-10-CM | POA: Diagnosis not present

## 2022-08-26 DIAGNOSIS — G9340 Encephalopathy, unspecified: Secondary | ICD-10-CM | POA: Diagnosis not present

## 2022-08-26 DIAGNOSIS — Z66 Do not resuscitate: Secondary | ICD-10-CM | POA: Diagnosis not present

## 2022-08-26 LAB — GLUCOSE, CAPILLARY
Glucose-Capillary: 159 mg/dL — ABNORMAL HIGH (ref 70–99)
Glucose-Capillary: 161 mg/dL — ABNORMAL HIGH (ref 70–99)
Glucose-Capillary: 162 mg/dL — ABNORMAL HIGH (ref 70–99)
Glucose-Capillary: 169 mg/dL — ABNORMAL HIGH (ref 70–99)
Glucose-Capillary: 183 mg/dL — ABNORMAL HIGH (ref 70–99)
Glucose-Capillary: 186 mg/dL — ABNORMAL HIGH (ref 70–99)

## 2022-08-26 LAB — TYPE AND SCREEN: Antibody Screen: NEGATIVE

## 2022-08-26 LAB — CBC
HCT: 25.8 % — ABNORMAL LOW (ref 39.0–52.0)
Hemoglobin: 8.4 g/dL — ABNORMAL LOW (ref 13.0–17.0)
MCH: 29.6 pg (ref 26.0–34.0)
MCHC: 32.6 g/dL (ref 30.0–36.0)
MCV: 90.8 fL (ref 80.0–100.0)
Platelets: 297 10*3/uL (ref 150–400)
RBC: 2.84 MIL/uL — ABNORMAL LOW (ref 4.22–5.81)
RDW: 16.1 % — ABNORMAL HIGH (ref 11.5–15.5)
WBC: 8.9 10*3/uL (ref 4.0–10.5)
nRBC: 0 % (ref 0.0–0.2)

## 2022-08-26 LAB — BPAM RBC
Blood Product Expiration Date: 202406222359
Unit Type and Rh: 5100

## 2022-08-26 LAB — RENAL FUNCTION PANEL
Albumin: 2.1 g/dL — ABNORMAL LOW (ref 3.5–5.0)
Anion gap: 8 (ref 5–15)
BUN: 36 mg/dL — ABNORMAL HIGH (ref 8–23)
CO2: 20 mmol/L — ABNORMAL LOW (ref 22–32)
Calcium: 8.1 mg/dL — ABNORMAL LOW (ref 8.9–10.3)
Chloride: 105 mmol/L (ref 98–111)
Creatinine, Ser: 0.81 mg/dL (ref 0.61–1.24)
GFR, Estimated: >60 mL/min (ref >60)
Glucose, Bld: 163 mg/dL — ABNORMAL HIGH (ref 70–99)
Phosphorus: 2.7 mg/dL (ref 2.5–4.6)
Potassium: 4 mmol/L (ref 3.5–5.1)
Sodium: 133 mmol/L — ABNORMAL LOW (ref 135–145)

## 2022-08-26 LAB — MAGNESIUM: Magnesium: 1.7 mg/dL (ref 1.7–2.4)

## 2022-08-26 MED ORDER — AMLODIPINE BESYLATE 5 MG PO TABS
5.0000 mg | ORAL_TABLET | Freq: Every day | ORAL | Status: DC
  Administered 2022-08-26: 5 mg
  Filled 2022-08-26: qty 1

## 2022-08-26 MED ORDER — INSULIN GLARGINE-YFGN 100 UNIT/ML ~~LOC~~ SOLN
15.0000 [IU] | Freq: Every day | SUBCUTANEOUS | Status: DC
  Administered 2022-08-27 – 2022-09-19 (×24): 15 [IU] via SUBCUTANEOUS
  Filled 2022-08-26 (×24): qty 0.15

## 2022-08-26 NOTE — Plan of Care (Signed)
  Problem: Clinical Measurements: Goal: Respiratory complications will improve Outcome: Progressing Goal: Cardiovascular complication will be avoided Outcome: Progressing   Problem: Coping: Goal: Level of anxiety will decrease Outcome: Progressing   Problem: Elimination: Goal: Will not experience complications related to bowel motility Outcome: Progressing Goal: Will not experience complications related to urinary retention Outcome: Progressing   Problem: Skin Integrity: Goal: Risk for impaired skin integrity will decrease Outcome: Progressing

## 2022-08-26 NOTE — Progress Notes (Signed)
PROGRESS NOTE  Raymond Sampson QIO:[id number] DOB: March 20, 1941   PCP: Gracelyn Nurse, MD  Patient is from: Home.  Lives with his wife.  Has rolling walker but does not use.  DOA: 08/18/2022 LOS: 8  Chief complaints Chief Complaint  Patient presents with   Fall on [place]inners     Brief Narrative / Interim history: 82 year old M with PMH of CAD, CVA, HTN, HLD, DM-2, cervical spine fusion and OA presented to ED after fall at home and found to have traumatic right frontal SDH measuring 2.2 cm in maximum thickness with 5 to 6 mm midline shift to the left, and trace SAH at the right temporal lobe.  Patient was on Plavix and aspirin.  CT cervical spine without acute finding but some sclerotic changes at C2-C3 facet joint.  He was admitted to ICU by neurosurgery. He was initially minimally symptomatic and refused surgery.  However, he developed intermittent focal seizure.   Repeat CT head on 5/18 with stable SDH and SAH.  MRI brain showed multiple small/punctate areas of restricted diffusion with bilateral frontal and parietal cortex suspicious for tiny acute infarcts or small intraparenchymal contusion and/or shear type injury in the setting of trauma.  EEG on 5/21 showed intermittent seizure.  Neurology consulted.  Patient was started on Keppra and Vimpat.  Palliative medicine consulted as well.  Patient was transferred to hospitalist service on 5/23.  Subsequent LTM EEG without seizure.  Neurology signed off.  Encephalopathy improved but seems to be delirious.  Remains NPO.  He is on tube feed via cortrack.     Subjective: Seen and examined earlier this morning.  No major events overnight of this morning.  No complaints.  He denies pain but not a great historian.  He is only oriented to self and his wife.  Thinks he is somewhat across from [place].  Taking sips of coffee with thickener from his wife  Objective: Vitals:   08/26/22 0022 08/26/22 0425 08/26/22 0500 08/26/22 0814  BP: (!) 147/74  (!) 157/72  (!) 162/73  Pulse: 81 79  74  Resp: 19 19  18   Temp: 97.7 F (36.5 C) 98.4 F (36.9 C)  98.1 F (36.7 C)  TempSrc: Oral Oral  Oral  SpO2: 95% 95%  99%  Weight:   70.2 kg   Height:        Examination:  GENERAL: Appears frail.  No apparent distress. HEENT: MMM.  Stitches on his left frontal head NECK: Supple.  No apparent JVD.  RESP:  No IWOB.  Fair aeration bilaterally. CVS:  RRR. Heart sounds normal.  ABD/GI/GU: BS+. Abd soft, NTND.  MSK/EXT:   Moves extremities.  Significant muscle mass and subcu fat loss.  BLE weakness but symmetric. SKIN: Bruising over his left hip. Stitches on his left frontal head NEURO: Awake.  Oriented to self and wife but not place or time.  Follows some commands.  BLE weakness. PSYCH: Somewhat confused.  Not agitated.  Procedures:  None  Microbiology summarized: MRSA PCR screen positive.  Assessment and plan: Principal Problem:   Traumatic subdural hematoma without loss of consciousness (HCC) Active Problems:   Provoked seizures (HCC)   Encephalopathy acute   DNR (do not resuscitate)   Protein-calorie malnutrition, severe  Fall at home Left frontal head laceration: s/p repair with stitches Traumatic right subdural hematoma/subarachnoid hemorrhage with midline shift due to fall and antiplatelets: on Plavix and aspirin.  Repeat CT with a stable hematoma.  MRI finding as above. Initially admitted  by neurosurgery, and declined surgical intervention. Neurosurgery recommended holding Plavix and aspirin for 1 week, and outpatient follow-up. -Stitch removal on 5/26 -Fall precaution, PT/OT  Provoked seizures due to the above: Seizures noted on EEG on 5/31.  Subsequent LTM EEG with evidence of epileptogenicity arising from right central parietal region but no active seizure.  -Neurology signed off.  Appreciate recommendations  -Keppra 1000 mg twice daily and Vimpat 100 mg twice daily  -Clonazepam ODT 2 mg 4 clinical seizure lasting more  than 2 minutes   Acute encephalopathy: Likely due to the above.  No focal neurodeficit.  Neurology decreased Keppra to 1000 mg twice daily.  Awake but only oriented to self and his wife. -Reorientation and delirium precaution -Avoid or minimize sedating medications  Dysphagia: N.p.o. but can have honey thick/puree from nursing stock -Continue TF via cortrak -SLP following -Monitor for refeeding syndrome  Left hip and back pain: Has bruising over left hip.  Also limited ROM in left hip due to pain.  Left hip x-ray without acute finding. -Lidoderm patch to left hip  Acute blood loss anemia: Hgb dropped about 3 g.  Has some bruising on his left arm and left hip but no signs of hematoma.  No report of melena or hematochezia.  No significant iron deficiency on anemia panel.  Transfused 2 units with appropriate response on 5/24 Recent Labs    06/27/22 0514 06/28/22 0453 06/29/22 0716 06/30/22 0540 07/01/22 0400 07/02/22 0439 08/18/22 1612 08/25/22 0635 08/25/22 1956 08/26/22 0349  HGB 9.7* 9.7* 10.0* 9.6* 9.6* 9.9* 9.9* 6.5* 8.5* 8.4*  -Discontinued subcu heparin on 5/24.  SCD for VTE prophylaxis -Monitor H&H.  Goal Hgb > 8.0 given CAD.  History of CAD/CVA: Stable -Continue holding Plavix and aspirin  Controlled NIDDM-2 with hyperglycemia: A1c 6.0%.  Seems to be on metformin and Sitagliptin at home. Recent Labs  Lab 08/25/22 2050 08/26/22 0022 08/26/22 0423 08/26/22 0810 08/26/22 1132  GLUCAP 193* 186* 159* 162* 183*  -Continue SSI-moderate every 4 hours -Increase Semglee to 15 units daily starting 5/26  Essential hypertension: BP remains elevated -Increase Coreg to 12.5 mg daily -Restart amlodipine -Continue holding lisinopril and metoprolol -IV labetalol as needed  Hypophosphatemia/hypokalemia: -Monitor and replenish as appropriate  Mood disorder: -Continue home Zoloft.  Severe malnutrition/dysphagia: Body mass index is 24.24 kg/m. Nutrition Problem: Severe  Malnutrition Etiology: chronic illness (fall) Signs/Symptoms: severe fat depletion, severe muscle depletion Interventions: Tube feeding, Prostat, MVI   DVT prophylaxis:  Place and maintain sequential compression device Start: 08/25/22 0839 SCDs Start: 08/18/22 2106  Code Status: DNR/DNI Family Communication: Updated patient's wife at bedside Level of care: Progressive Status is: Inpatient Remains inpatient appropriate because: Subdural hematoma, dysphagia and provoked seizure   Final disposition: SNF Consultants:  Neurosurgery admitted patient Pulmonology Neurology Palliative medicine  55 minutes with more than 50% spent in reviewing records, counseling patient/family and coordinating care.   Sch Meds:  Scheduled Meds:  atorvastatin  80 mg Per Tube Daily   carvedilol  12.5 mg Per Tube BID WC   Chlorhexidine Gluconate Cloth  6 each Topical Daily   feeding supplement (PROSource TF20)  60 mL Per Tube BID   insulin aspart  0-15 Units Subcutaneous Q4H   insulin glargine-yfgn  10 Units Subcutaneous Daily   lidocaine  1 patch Transdermal Q24H   multivitamin with minerals  1 tablet Per Tube Daily   mouth rinse  15 mL Mouth Rinse 4 times per day   sertraline  25 mg Per Tube  QPM   thiamine  100 mg Per Tube Daily   Continuous Infusions:  sodium chloride 75 mL/hr at 08/26/22 1234   feeding supplement (JEVITY 1.5 CAL/FIBER) 1,000 mL (08/26/22 0113)   lacosamide (VIMPAT) IV 100 mg (08/26/22 1015)   levETIRAcetam 1,000 mg (08/26/22 0837)   PRN Meds:.acetaminophen (TYLENOL) oral liquid 160 mg/5 mL, [DISCONTINUED] acetaminophen **OR** acetaminophen, clonazepam, HYDROmorphone (DILAUDID) injection, labetalol, [DISCONTINUED] ondansetron **OR** ondansetron (ZOFRAN) IV, mouth rinse, mouth rinse  Antimicrobials: Anti-infectives (From admission, onward)    None        I have personally reviewed the following labs and images: CBC: Recent Labs  Lab 08/25/22 0635 08/25/22 1956  08/26/22 0349  WBC 8.4 8.9 8.9  HGB 6.5* 8.5* 8.4*  HCT 20.6* 26.4* 25.8*  MCV 94.5 90.7 90.8  PLT 330 296 297   BMP &GFR Recent Labs  Lab 08/21/22 1433 08/23/22 1453 08/23/22 1453 08/24/22 0600 08/24/22 2050 08/25/22 0634 08/25/22 0635 08/26/22 0349  NA 132*  --   --  136  --   --  136 133*  K 4.5  --   --  3.5  --   --  3.8 4.0  CL 97*  --   --  103  --   --  105 105  CO2 27  --   --  23  --   --  21* 20*  GLUCOSE 204*  --   --  195*  --   --  172* 163*  BUN 26*  --   --  34*  --   --  35* 36*  CREATININE 1.15  --   --  0.82  --   --  0.85 0.81  CALCIUM 8.6*  --   --  8.5*  --   --  8.4* 8.1*  MG  --  1.9  --  2.0 1.6*  --  1.9 1.7  PHOS  --  3.0   < > 1.8* 2.5 2.2* 2.1* 2.7   < > = values in this interval not displayed.   Estimated Creatinine Clearance: 65.7 mL/min (by C-G formula based on SCr of 0.81 mg/dL). Liver & Pancreas: Recent Labs  Lab 08/21/22 1433 08/25/22 0635 08/26/22 0349  AST 19  --   --   ALT 18  --   --   ALKPHOS 93  --   --   BILITOT 0.8  --   --   PROT 6.4*  --   --   ALBUMIN 2.8* 2.4* 2.1*   No results for input(s): "LIPASE", "AMYLASE" in the last 168 hours. Recent Labs  Lab 08/21/22 1433  AMMONIA <10   Diabetic: No results for input(s): "HGBA1C" in the last 72 hours.  Recent Labs  Lab 08/25/22 2050 08/26/22 0022 08/26/22 0423 08/26/22 0810 08/26/22 1132  GLUCAP 193* 186* 159* 162* 183*   Cardiac Enzymes: Recent Labs  Lab 08/24/22 0600  CKTOTAL 44*   No results for input(s): "PROBNP" in the last 8760 hours. Coagulation Profile: No results for input(s): "INR", "PROTIME" in the last 168 hours.  Thyroid Function Tests: No results for input(s): "TSH", "T4TOTAL", "FREET4", "T3FREE", "THYROIDAB" in the last 72 hours. Lipid Profile: No results for input(s): "CHOL", "HDL", "LDLCALC", "TRIG", "CHOLHDL", "LDLDIRECT" in the last 72 hours.  Anemia Panel: Recent Labs    08/25/22 0635  VITAMINB12 1,513*  FOLATE 9.3  FERRITIN  138  TIBC 231*  IRON 31*  RETICCTPCT 4.3*   Urine analysis:    Component Value Date/Time  COLORURINE YELLOW (A) 06/25/2022 1930   APPEARANCEUR CLEAR (A) 06/25/2022 1930   LABSPEC 1.023 06/25/2022 1930   PHURINE 5.0 06/25/2022 1930   GLUCOSEU 50 (A) 06/25/2022 1930   HGBUR NEGATIVE 06/25/2022 1930   BILIRUBINUR NEGATIVE 06/25/2022 1930   KETONESUR NEGATIVE 06/25/2022 1930   PROTEINUR 100 (A) 06/25/2022 1930   NITRITE NEGATIVE 06/25/2022 1930   LEUKOCYTESUR NEGATIVE 06/25/2022 1930   Sepsis Labs: Invalid input(s): "PROCALCITONIN", "LACTICIDVEN"  Microbiology: Recent Results (from the past 240 hour(s))  MRSA Next Gen by PCR, Nasal     Status: Abnormal   Collection Time: 08/18/22 11:45 PM   Specimen: Nasal Mucosa; Nasal Swab  Result Value Ref Range Status   MRSA by PCR Next Gen DETECTED (A) NOT DETECTED Final    Comment: RESULT CALLED TO, READ BACK BY AND VERIFIED WITH: DADI ALI RN 08/19/22 @ 0118 BY AB (NOTE) The GeneXpert MRSA Assay (FDA approved for NASAL specimens only), is one component of a comprehensive MRSA colonization surveillance program. It is not intended to diagnose MRSA infection nor to guide or monitor treatment for MRSA infections. Test performance is not FDA approved in patients less than 67 years old. Performed at [facility], [address] [place], [place] [address]     Radiology Studies: No results found.    Tyjon Bowen T. Oshua Mcconaha [facility]  If 7PM-7AM, please contact night-coverage [email or web address] 08/26/2022, 12:44 PM

## 2022-08-27 DIAGNOSIS — G9340 Encephalopathy, unspecified: Secondary | ICD-10-CM | POA: Diagnosis not present

## 2022-08-27 DIAGNOSIS — S065X0A Traumatic subdural hemorrhage without loss of consciousness, initial encounter: Secondary | ICD-10-CM | POA: Diagnosis not present

## 2022-08-27 DIAGNOSIS — R569 Unspecified convulsions: Secondary | ICD-10-CM | POA: Diagnosis not present

## 2022-08-27 DIAGNOSIS — Z66 Do not resuscitate: Secondary | ICD-10-CM | POA: Diagnosis not present

## 2022-08-27 LAB — RENAL FUNCTION PANEL
Albumin: 2.2 g/dL — ABNORMAL LOW (ref 3.5–5.0)
Anion gap: 7 (ref 5–15)
BUN: 32 mg/dL — ABNORMAL HIGH (ref 8–23)
CO2: 22 mmol/L (ref 22–32)
Calcium: 8.1 mg/dL — ABNORMAL LOW (ref 8.9–10.3)
Chloride: 102 mmol/L (ref 98–111)
Creatinine, Ser: 0.74 mg/dL (ref 0.61–1.24)
GFR, Estimated: >60 mL/min (ref >60)
Glucose, Bld: 152 mg/dL — ABNORMAL HIGH (ref 70–99)
Phosphorus: 2.9 mg/dL (ref 2.5–4.6)
Potassium: 4.2 mmol/L (ref 3.5–5.1)
Sodium: 131 mmol/L — ABNORMAL LOW (ref 135–145)

## 2022-08-27 LAB — GLUCOSE, CAPILLARY
Glucose-Capillary: 146 mg/dL — ABNORMAL HIGH (ref 70–99)
Glucose-Capillary: 178 mg/dL — ABNORMAL HIGH (ref 70–99)
Glucose-Capillary: 195 mg/dL — ABNORMAL HIGH (ref 70–99)
Glucose-Capillary: 188 mg/dL — ABNORMAL HIGH (ref 70–99)

## 2022-08-27 LAB — HEMOGLOBIN AND HEMATOCRIT, BLOOD
HCT: 28.9 % — ABNORMAL LOW (ref 39.0–52.0)
Hemoglobin: 9.5 g/dL — ABNORMAL LOW (ref 13.0–17.0)

## 2022-08-27 LAB — MAGNESIUM: Magnesium: 1.8 mg/dL (ref 1.7–2.4)

## 2022-08-27 MED ORDER — AMLODIPINE BESYLATE 10 MG PO TABS
10.0000 mg | ORAL_TABLET | Freq: Every day | ORAL | Status: DC
  Administered 2022-08-27 – 2022-09-21 (×26): 10 mg
  Filled 2022-08-27 (×25): qty 1

## 2022-08-27 MED ORDER — CLOPIDOGREL BISULFATE 75 MG PO TABS
75.0000 mg | ORAL_TABLET | Freq: Every day | ORAL | Status: DC
  Administered 2022-08-27 – 2022-08-28 (×2): 75 mg via ORAL
  Filled 2022-08-27 (×3): qty 1

## 2022-08-27 NOTE — Progress Notes (Signed)
PROGRESS NOTE  Raymond Sampson UEA:[id number] DOB: 02-22-1941   PCP: Gracelyn Nurse, MD  Patient is from: Home.  Lives with his wife.  Has rolling walker but does not use.  DOA: 08/18/2022 LOS: 9  Chief complaints Chief Complaint  Patient presents with   Fall on [place]     Brief Narrative / Interim history: 82 year old M with PMH of CAD, CVA, HTN, HLD, DM-2, cervical spine fusion and OA presented to ED after fall at home and found to have traumatic right frontal SDH measuring 2.2 cm in maximum thickness with 5 to 6 mm midline shift to the left, and trace SAH at the right temporal lobe.  Patient was on Plavix and aspirin.  CT cervical spine without acute finding but some sclerotic changes at C2-C3 facet joint.  He was admitted to ICU by neurosurgery. He was initially minimally symptomatic and refused surgery.  However, he developed intermittent focal seizure.   Repeat CT head on 5/18 with stable SDH and SAH.  MRI brain showed multiple small/punctate areas of restricted diffusion with bilateral frontal and parietal cortex suspicious for tiny acute infarcts or small intraparenchymal contusion and/or shear type injury in the setting of trauma.  EEG on 5/21 showed intermittent seizure.  Neurology consulted.  Patient was started on Keppra and Vimpat.  Palliative medicine consulted as well.  Patient was transferred to hospitalist service on 5/23.  Subsequent LTM EEG without seizure.  Neurology signed off.  Encephalopathy improved but seems to be delirious.  Remains NPO.  He is on tube feed via cortrack.     Subjective: Seen and examined earlier this morning.  No major events overnight of this morning.  He states he did not sleep due to many visitors overnight.  He is oriented to self and wife but not place.  Follows commands.  No complaints.  Objective: Vitals:   08/26/22 2318 08/27/22 0400 08/27/22 0500 08/27/22 0719  BP: (!) 152/65 (!) 157/66  (!) 154/76  Pulse: 74 72  77  Resp: 17  18  16   Temp: 98.3 F (36.8 C) 98.5 F (36.9 C)  98.6 F (37 C)  TempSrc: Oral Oral  Oral  SpO2: 96% 96%  95%  Weight:      Height:        Examination:  GENERAL: Appears frail.  No apparent distress. HEENT: MMM.  Sutures on his left frontal head NECK: Supple.  No apparent JVD.  RESP:  No IWOB.  Fair aeration bilaterally. CVS:  RRR. Heart sounds normal.  ABD/GI/GU: BS+. Abd soft, NTND.  MSK/EXT:   Moves extremities.  Significant muscle mass and subcu fat loss.  BLE weakness but symmetric. SKIN: Bruising over his left hip.  Sutures on his left frontal head NEURO: Awake.  Oriented to self and wife but not place or time.  Follows some commands.  BLE weakness. PSYCH: Calm.  No agitation.  Procedures:  None  Microbiology summarized: MRSA PCR screen positive.  Assessment and plan: Principal Problem:   Traumatic subdural hematoma without loss of consciousness (HCC) Active Problems:   Provoked seizures (HCC)   Encephalopathy acute   DNR (do not resuscitate)   Protein-calorie malnutrition, severe  Fall at home Left frontal head laceration: s/p repair with stitches Traumatic right subdural hematoma/subarachnoid hemorrhage with midline shift due to fall and antiplatelets: on Plavix and aspirin.  Repeat CT with a stable hematoma.  MRI finding as above. Initially admitted by neurosurgery, and declined surgical intervention.  -NS recs: Hold Plavix and  aspirin for 1 week, and outpatient follow-up. -Remove sutures on left frontal head -Fall precaution, PT/OT  Provoked seizures due to the above: Seizures noted on EEG on 5/31.  Subsequent LTM EEG with evidence of epileptogenicity arising from right central parietal region but no active seizure.  -Neurology signed off.  Appreciate recommendations  -Keppra 1000 mg twice daily and Vimpat 100 mg twice daily  -Clonazepam ODT 2 mg 4 clinical seizure lasting more than 2 minutes   Acute encephalopathy: Likely due to the above.  No focal  neurodeficit.  Neurology decreased Keppra to 1000 mg twice daily.  Awake but only oriented to self and his wife. -Reorientation and delirium precaution -Avoid or minimize sedating medications  Dysphagia: N.p.o. but can have honey thick/puree from nursing stock -Continue TF via cortrak -SLP following -Monitor for refeeding syndrome  Left hip and back pain: Has bruising over left hip.  Also limited ROM in left hip due to pain.  Left hip x-ray without acute finding. -Lidoderm patch to left hip  Acute blood loss anemia: Hgb dropped about 3 g.  Has some bruising on his left arm and left hip but no signs of hematoma.  No report of melena or hematochezia.  No significant iron deficiency on anemia panel.  Transfused 2 units with appropriate response on 5/24 Recent Labs    06/28/22 0453 06/29/22 0716 06/30/22 0540 07/01/22 0400 07/02/22 0439 08/18/22 1612 08/25/22 0635 08/25/22 1956 08/26/22 0349 08/27/22 0454  HGB 9.7* 10.0* 9.6* 9.6* 9.9* 9.9* 6.5* 8.5* 8.4* 9.5*  -Monitor H&H.  Goal Hgb > 8.0 given CAD.  History of CAD/CVA: Stable -Resume Plavix today -Continue holding aspirin  Controlled NIDDM-2 with hyperglycemia: A1c 6.0%.  Seems to be on metformin and Sitagliptin at home. Recent Labs  Lab 08/26/22 0810 08/26/22 1132 08/26/22 1522 08/26/22 2325 08/27/22 0325  GLUCAP 162* 183* 161* 169* 146*  -Continue SSI-moderate every 4 hours -Increase Semglee to 15 units daily  Essential hypertension: BP remains elevated -Increased Coreg to 12.5 mg daily -Increase amlodipine to 10 mg daily -Continue holding lisinopril and metoprolol -IV labetalol as needed  Hypophosphatemia/hypokalemia: -Monitor and replenish as appropriate  Hyponatremia: Relatively stable stable Recent Labs  Lab 08/21/22 1433 08/24/22 0600 08/25/22 0635 08/26/22 0349 08/27/22 0454  NA 132* 136 136 133* 131*  -Continue monitoring   Mood disorder: -Continue home Zoloft.  Severe  malnutrition/dysphagia: Body mass index is 24.24 kg/m. Nutrition Problem: Severe Malnutrition Etiology: chronic illness (fall) Signs/Symptoms: severe fat depletion, severe muscle depletion Interventions: Tube feeding, Prostat, MVI   DVT prophylaxis:  Place and maintain sequential compression device Start: 08/25/22 0839 SCDs Start: 08/18/22 2106  Code Status: DNR/DNI Family Communication: Updated patient's wife at bedside Level of care: Med-Surg Status is: Inpatient Remains inpatient appropriate because: Subdural hematoma, dysphagia and provoked seizure   Final disposition: SNF Consultants:  Neurosurgery admitted patient Pulmonology Neurology Palliative medicine  35 minutes with more than 50% spent in reviewing records, counseling patient/family and coordinating care.   Sch Meds:  Scheduled Meds:  amLODipine  10 mg Per Tube Daily   atorvastatin  80 mg Per Tube Daily   carvedilol  12.5 mg Per Tube BID WC   Chlorhexidine Gluconate Cloth  6 each Topical Daily   feeding supplement (PROSource TF20)  60 mL Per Tube BID   insulin aspart  0-15 Units Subcutaneous Q4H   insulin glargine-yfgn  15 Units Subcutaneous Daily   lidocaine  1 patch Transdermal Q24H   multivitamin with minerals  1 tablet Per  Tube Daily   mouth rinse  15 mL Mouth Rinse 4 times per day   sertraline  25 mg Per Tube QPM   thiamine  100 mg Per Tube Daily   Continuous Infusions:  feeding supplement (JEVITY 1.5 CAL/FIBER) 1,000 mL (08/27/22 0414)   lacosamide (VIMPAT) IV 100 mg (08/27/22 1137)   levETIRAcetam 1,000 mg (08/27/22 0830)   PRN Meds:.acetaminophen (TYLENOL) oral liquid 160 mg/5 mL, [DISCONTINUED] acetaminophen **OR** acetaminophen, clonazepam, HYDROmorphone (DILAUDID) injection, labetalol, [DISCONTINUED] ondansetron **OR** ondansetron (ZOFRAN) IV, mouth rinse, mouth rinse  Antimicrobials: Anti-infectives (From admission, onward)    None        I have personally reviewed the following labs  and images: CBC: Recent Labs  Lab 08/25/22 0635 08/25/22 1956 08/26/22 0349 08/27/22 0454  WBC 8.4 8.9 8.9  --   HGB 6.5* 8.5* 8.4* 9.5*  HCT 20.6* 26.4* 25.8* 28.9*  MCV 94.5 90.7 90.8  --   PLT 330 296 297  --    BMP &GFR Recent Labs  Lab 08/21/22 1433 08/23/22 1453 08/24/22 0600 08/24/22 2050 08/25/22 0634 08/25/22 0635 08/26/22 0349 08/27/22 0454  NA 132*  --  136  --   --  136 133* 131*  K 4.5  --  3.5  --   --  3.8 4.0 4.2  CL 97*  --  103  --   --  105 105 102  CO2 27  --  23  --   --  21* 20* 22  GLUCOSE 204*  --  195*  --   --  172* 163* 152*  BUN 26*  --  34*  --   --  35* 36* 32*  CREATININE 1.15  --  0.82  --   --  0.85 0.81 0.74  CALCIUM 8.6*  --  8.5*  --   --  8.4* 8.1* 8.1*  MG  --    < > 2.0 1.6*  --  1.9 1.7 1.8  PHOS  --    < > 1.8* 2.5 2.2* 2.1* 2.7 2.9   < > = values in this interval not displayed.   Estimated Creatinine Clearance: 66.6 mL/min (by C-G formula based on SCr of 0.74 mg/dL). Liver & Pancreas: Recent Labs  Lab 08/21/22 1433 08/25/22 0635 08/26/22 0349 08/27/22 0454  AST 19  --   --   --   ALT 18  --   --   --   ALKPHOS 93  --   --   --   BILITOT 0.8  --   --   --   PROT 6.4*  --   --   --   ALBUMIN 2.8* 2.4* 2.1* 2.2*   No results for input(s): "LIPASE", "AMYLASE" in the last 168 hours. Recent Labs  Lab 08/21/22 1433  AMMONIA <10   Diabetic: No results for input(s): "HGBA1C" in the last 72 hours.  Recent Labs  Lab 08/26/22 0810 08/26/22 1132 08/26/22 1522 08/26/22 2325 08/27/22 0325  GLUCAP 162* 183* 161* 169* 146*   Cardiac Enzymes: Recent Labs  Lab 08/24/22 0600  CKTOTAL 44*   No results for input(s): "PROBNP" in the last 8760 hours. Coagulation Profile: No results for input(s): "INR", "PROTIME" in the last 168 hours.  Thyroid Function Tests: No results for input(s): "TSH", "T4TOTAL", "FREET4", "T3FREE", "THYROIDAB" in the last 72 hours. Lipid Profile: No results for input(s): "CHOL", "HDL",  "LDLCALC", "TRIG", "CHOLHDL", "LDLDIRECT" in the last 72 hours.  Anemia Panel: Recent Labs    08/25/22  0635  VITAMINB12 1,513*  FOLATE 9.3  FERRITIN 138  TIBC 231*  IRON 31*  RETICCTPCT 4.3*   Urine analysis:    Component Value Date/Time   COLORURINE YELLOW (A) 06/25/2022 1930   APPEARANCEUR CLEAR (A) 06/25/2022 1930   LABSPEC 1.023 06/25/2022 1930   PHURINE 5.0 06/25/2022 1930   GLUCOSEU 50 (A) 06/25/2022 1930   HGBUR NEGATIVE 06/25/2022 1930   BILIRUBINUR NEGATIVE 06/25/2022 1930   KETONESUR NEGATIVE 06/25/2022 1930   PROTEINUR 100 (A) 06/25/2022 1930   NITRITE NEGATIVE 06/25/2022 1930   LEUKOCYTESUR NEGATIVE 06/25/2022 1930   Sepsis Labs: Invalid input(s): "PROCALCITONIN", "LACTICIDVEN"  Microbiology: Recent Results (from the past 240 hour(s))  MRSA Next Gen by PCR, Nasal     Status: Abnormal   Collection Time: 08/18/22 11:45 PM   Specimen: Nasal Mucosa; Nasal Swab  Result Value Ref Range Status   MRSA by PCR Next Gen DETECTED (A) NOT DETECTED Final    Comment: RESULT CALLED TO, READ BACK BY AND VERIFIED WITH: DADI ALI RN 08/19/22 @ 0118 BY AB (NOTE) The GeneXpert MRSA Assay (FDA approved for NASAL specimens only), is one component of a comprehensive MRSA colonization surveillance program. It is not intended to diagnose MRSA infection nor to guide or monitor treatment for MRSA infections. Test performance is not FDA approved in patients less than 35 years old. Performed at [facility], [address], [place], [place] [address]     Radiology Studies: No results found.    Raymond Sampson T. Janeice Stegall [facility]  If 7PM-7AM, please contact night-coverage [email or web address] 08/27/2022, 11:41 AM

## 2022-08-27 NOTE — Progress Notes (Signed)
Daily Progress Note   Patient Name: Raymond Sampson       Date: 08/27/2022 DOB: 12/24/1940  Age: 82 y.o. MRN#: [id number] Attending Physician: Almon Hercules, MD Primary Care Physician: Gracelyn Nurse, MD Admit Date: 08/18/2022  Reason for Consultation/Follow-up: Establishing goals of care  Subjective: Patient was alert and sitting upright in the bed with his wife at the bedside.  Length of Stay: 9  Current Medications: Scheduled Meds:   amLODipine  10 mg Per Tube Daily   atorvastatin  80 mg Per Tube Daily   carvedilol  12.5 mg Per Tube BID WC   clopidogrel  75 mg Oral Daily   feeding supplement (PROSource TF20)  60 mL Per Tube BID   insulin aspart  0-15 Units Subcutaneous Q4H   insulin glargine-yfgn  15 Units Subcutaneous Daily   lidocaine  1 patch Transdermal Q24H   multivitamin with minerals  1 tablet Per Tube Daily   mouth rinse  15 mL Mouth Rinse 4 times per day   sertraline  25 mg Per Tube QPM   thiamine  100 mg Per Tube Daily    Continuous Infusions:  feeding supplement (JEVITY 1.5 CAL/FIBER) 1,000 mL (08/27/22 0414)   lacosamide (VIMPAT) IV 100 mg (08/27/22 1137)   levETIRAcetam 1,000 mg (08/27/22 0830)    PRN Meds: acetaminophen (TYLENOL) oral liquid 160 mg/5 mL, [DISCONTINUED] acetaminophen **OR** acetaminophen, clonazepam, HYDROmorphone (DILAUDID) injection, labetalol, [DISCONTINUED] ondansetron **OR** ondansetron (ZOFRAN) IV, mouth rinse, mouth rinse  Physical Exam HENT:     Head:     Comments: Bruising left temple/forehead  Pulmonary:     Effort: Pulmonary effort is normal.  Skin:    General: Skin is warm and dry.  Neurological:     Mental Status: He is alert.             Vital Signs: BP 121/63 (BP Location: Right Arm)   Pulse 77   Temp 98.1 F  (36.7 C) (Oral)   Resp 16   Ht 5\' 7"  (1.702 m)   Wt 70.2 kg   SpO2 97%   BMI 24.24 kg/m  SpO2: SpO2: 97 % O2 Device: O2 Device: Room Air O2 Flow Rate: O2 Flow Rate (L/min): 2 L/min  Intake/output summary:  Intake/Output Summary (Last 24 hours) at 08/27/2022 1534 Last data filed at  08/27/2022 0838 Gross per 24 hour  Intake --  Output 2100 ml  Net -2100 ml   LBM: Last BM Date : 08/26/22 Baseline Weight: Weight: 65.2 kg Most recent weight: Weight: 70.2 kg       Palliative Assessment/Data: 20%      Patient Active Problem List   Diagnosis Date Noted   Protein-calorie malnutrition, severe 08/24/2022   Provoked seizures (HCC) 08/23/2022   Encephalopathy acute 08/23/2022   DNR (do not resuscitate) 08/23/2022   Traumatic subdural hematoma without loss of consciousness (HCC) 08/18/2022   Fall 06/22/2022   Malnutrition of moderate degree 06/22/2022   Acute blood loss anemia 06/21/2022   Multiple rib fractures involving four or more ribs, right side 06/20/2022   Intramuscular hematoma 06/19/2022   Rib fractures 06/19/2022   Hemothorax on right 06/19/2022   Leukocytosis 06/19/2022   Status post coronary artery stent placement 05/02/2022   Mild dementia (HCC) 04/14/2022   Non-ST elevation (NSTEMI) myocardial infarction (HCC) 04/13/2022   History of COVID-19 04/13/2022   Lung nodule 04/13/2022   PAD (peripheral artery disease) (HCC) 04/13/2022   Flu 06/05/2017   Diabetes mellitus type 2, controlled, with complications (HCC) 04/06/2015   Carotid stenosis 05/14/2013   CAROTID BRUIT, LEFT 04/25/2010   PALPITATIONS 06/18/2009   Hyperlipidemia 04/27/2009   Essential hypertension 04/27/2009   Coronary artery disease of native artery of native heart with stable angina pectoris (HCC) 04/27/2009    Palliative Care Assessment & Plan   Patient Profile: 82 y.o. male with past medical history of CAD, stroke, DM2, HTN, HLD admitted on 08/18/2022 after a fall at his home with a large  right-sided subdural hematoma. He was initially alert and oriented but on 08/21/22 he he became more drowsy and was only able to intermittently follow commands and intermittently answer questions. EEG to evaluate for seizure.   Assessment: Patient was oriented to self and his wife. He thought he was in a building near [employer]. He was able to have small talk saying hello and answering that he is very hungry and wants [employer].   The patient's wife is encouraged with the patient's continued improvement. He is still using a cortrak for nutrition. We have discussed that this is a temporary method for nutrition. He was seen by speech two days ago and can have honey-thick liquid and purees.  Recommendations/Plan: DNR Allow time for outcomes Encourage family to continue conversations around GOC  Goals of Care and Additional Recommendations: Limitations on Scope of Treatment: Use MOST form for guidance  Code Status:    Code Status Orders  (From admission, onward)           Start     Ordered   08/19/22 0022  Do not attempt resuscitation (DNR)  Continuous       Question Answer Comment  If patient has no pulse and is not breathing Do Not Attempt Resuscitation   If patient has a pulse and/or is breathing: Medical Treatment Goals COMFORT MEASURES: Keep clean/warm/dry, use medication by any route; positioning, wound care and other measures to relieve pain/suffering; use oxygen, suction/manual treatment of airway obstruction for comfort; do not transfer unless for comfort needs.   Consent: Discussion documented in EHR or advanced directives reviewed      08/19/22 0023           Code Status History     Date Active Date Inactive Code Status Order ID Comments User Context   08/18/2022 2105 08/19/2022 0023 Full Code [id number]  Vonna Drafts ED   06/28/2022 1422 07/03/2022 2122 DNR [id number]  Morton Stall, NP Inpatient   06/19/2022 1721 06/28/2022 1422 Full Code [id number]   Floydene Flock, MD ED   05/02/2022 1135 05/03/2022 1523 Full Code [id number]  Yvonne Kendall, MD Inpatient   04/13/2022 0048 04/15/2022 2115 Full Code [id number]  Nolberto Hanlon, MD ED   06/05/2017 2015 06/07/2017 1757 Full Code [id number]  Ihor Austin, MD Inpatient       Prognosis:  Unable to determine  Discharge Planning: To Be Determined  Care plan was discussed with bedside RN  Thank you for allowing the Palliative Medicine Team to assist in the care of this patient.  Time spent: 40 minutes   Detailed review of medical records ( labs, imaging, vital signs), medically appropriate exam, counseling and education to care partner, documenting clinical information, medication management, coordination of care.    Sherryll Burger, NP  Please contact Palliative Medicine Team phone at [phone] for questions and concerns.

## 2022-08-27 NOTE — Progress Notes (Signed)
Left forehead suture (4) removed , dry gauze applied, patient tolerated well, no any signs of swelling and drainage

## 2022-08-27 NOTE — Plan of Care (Signed)
  Problem: Clinical Measurements: Goal: Respiratory complications will improve Outcome: Progressing Goal: Cardiovascular complication will be avoided Outcome: Progressing   Problem: Nutrition: Goal: Adequate nutrition will be maintained Outcome: Progressing   Problem: Coping: Goal: Level of anxiety will decrease Outcome: Progressing   Problem: Elimination: Goal: Will not experience complications related to bowel motility Outcome: Progressing Goal: Will not experience complications related to urinary retention Outcome: Progressing   Problem: Skin Integrity: Goal: Risk for impaired skin integrity will decrease Outcome: Progressing

## 2022-08-28 DIAGNOSIS — Z515 Encounter for palliative care: Secondary | ICD-10-CM

## 2022-08-28 DIAGNOSIS — Z7189 Other specified counseling: Secondary | ICD-10-CM

## 2022-08-28 DIAGNOSIS — E43 Unspecified severe protein-calorie malnutrition: Secondary | ICD-10-CM | POA: Diagnosis not present

## 2022-08-28 DIAGNOSIS — S065XAA Traumatic subdural hemorrhage with loss of consciousness status unknown, initial encounter: Secondary | ICD-10-CM

## 2022-08-28 LAB — GLUCOSE, CAPILLARY
Glucose-Capillary: 162 mg/dL — ABNORMAL HIGH (ref 70–99)
Glucose-Capillary: 162 mg/dL — ABNORMAL HIGH (ref 70–99)
Glucose-Capillary: 169 mg/dL — ABNORMAL HIGH (ref 70–99)
Glucose-Capillary: 175 mg/dL — ABNORMAL HIGH (ref 70–99)
Glucose-Capillary: 187 mg/dL — ABNORMAL HIGH (ref 70–99)
Glucose-Capillary: 202 mg/dL — ABNORMAL HIGH (ref 70–99)

## 2022-08-28 LAB — MAGNESIUM: Magnesium: 1.7 mg/dL (ref 1.7–2.4)

## 2022-08-28 LAB — CBC
HCT: 30 % — ABNORMAL LOW (ref 39.0–52.0)
Hemoglobin: 9.7 g/dL — ABNORMAL LOW (ref 13.0–17.0)
MCH: 29.3 pg (ref 26.0–34.0)
MCHC: 32.3 g/dL (ref 30.0–36.0)
MCV: 90.6 fL (ref 80.0–100.0)
Platelets: 318 10*3/uL (ref 150–400)
RBC: 3.31 MIL/uL — ABNORMAL LOW (ref 4.22–5.81)
RDW: 15.8 % — ABNORMAL HIGH (ref 11.5–15.5)
WBC: 10.4 10*3/uL (ref 4.0–10.5)
nRBC: 0.2 % (ref 0.0–0.2)

## 2022-08-28 LAB — RENAL FUNCTION PANEL
Albumin: 2.2 g/dL — ABNORMAL LOW (ref 3.5–5.0)
Anion gap: 8 (ref 5–15)
BUN: 34 mg/dL — ABNORMAL HIGH (ref 8–23)
CO2: 23 mmol/L (ref 22–32)
Calcium: 8.2 mg/dL — ABNORMAL LOW (ref 8.9–10.3)
Chloride: 99 mmol/L (ref 98–111)
Creatinine, Ser: 0.8 mg/dL (ref 0.61–1.24)
GFR, Estimated: >60 mL/min (ref >60)
Glucose, Bld: 160 mg/dL — ABNORMAL HIGH (ref 70–99)
Phosphorus: 3.2 mg/dL (ref 2.5–4.6)
Potassium: 4.4 mmol/L (ref 3.5–5.1)
Sodium: 130 mmol/L — ABNORMAL LOW (ref 135–145)

## 2022-08-28 MED ORDER — ENOXAPARIN SODIUM 40 MG/0.4ML IJ SOSY
40.0000 mg | PREFILLED_SYRINGE | INTRAMUSCULAR | Status: DC
  Administered 2022-08-28 – 2022-09-21 (×25): 40 mg via SUBCUTANEOUS
  Filled 2022-08-28 (×25): qty 0.4

## 2022-08-28 MED ORDER — SODIUM CHLORIDE 0.9 % IV SOLN: INTRAVENOUS | Status: DC | PRN

## 2022-08-28 NOTE — Progress Notes (Signed)
PROGRESS NOTE  Raymond Sampson UJW:[id number] DOB: [birth year]   PCP: Gracelyn Nurse, MD  Patient is from: Home.  Lives with his wife.  Has rolling walker but does not use.  DOA: 08/18/2022 LOS: 9  Chief complaints Chief Complaint  Patient presents with   Fall on [place]inners     Brief Narrative / Interim history: 82 year old M with PMH of CAD, CVA, HTN, HLD, DM-2, cervical spine fusion and OA presented to ED after fall at home and found to have traumatic right frontal SDH measuring 2.2 cm in maximum thickness with 5 to 6 mm midline shift to the left, and trace SAH at the right temporal lobe.  Patient was on Plavix and aspirin.  CT cervical spine without acute finding but some sclerotic changes at C2-C3 facet joint.  He was admitted to ICU by neurosurgery. He was initially minimally symptomatic and refused surgery.  However, he developed intermittent focal seizure.   Repeat CT head on 5/18 with stable SDH and SAH.  MRI brain showed multiple small/punctate areas of restricted diffusion with bilateral frontal and parietal cortex suspicious for tiny acute infarcts or small intraparenchymal contusion and/or shear type injury in the setting of trauma.  EEG on 5/21 showed intermittent seizure.  Neurology consulted.  Patient was started on Keppra and Vimpat.  Palliative medicine consulted as well.  Patient was transferred to hospitalist service on 5/23.  Subsequent LTM EEG without seizure.  Neurology signed off.  Encephalopathy improved but seems to be delirious.  Remains NPO.  He is on tube feed via cortrack.     Subjective: Seen and examined earlier this morning.  No major events overnight of this morning.  No complaints.  Sleeping after breakfast.  Patient's wife at bedside.  Per wife, seems to be doing well with pured diet  Objective: Vitals:   08/26/22 2318 08/27/22 0400 08/27/22 0500 08/27/22 0719  BP: (!) 152/65 (!) 157/66  (!) 154/76  Pulse: 74 72  77  Resp: 17  18 16   Temp: 98.3 F  (36.8 C) 98.5 F (36.9 C)  98.6 F (37 C)  TempSrc: Oral Oral  Oral  SpO2: 96% 96%  95%  Weight:      Height:        Examination:  GENERAL: Appears frail.  No apparent distress. HEENT: MMM.  Hearing and vision grossly intact. NECK: Supple.  No apparent JVD.  RESP:  No IWOB.  Fair aeration bilaterally. CVS:  RRR. Heart sounds normal.  ABD/GI/GU: BS+. Abd soft, NTND.  MSK/EXT:   Moves extremities.  Significant muscle mass and subcu fat loss.  BLE weakness but symmetric. SKIN: Bruising over his left hip.  Sutures on his left frontal head NEURO: Sleeping after breakfast.   BLE weakness. PSYCH: Calm.  No agitation.  Procedures:  None  Microbiology summarized: MRSA PCR screen positive.  Assessment and plan: Principal Problem:   Traumatic subdural hematoma without loss of consciousness (HCC) Active Problems:   Provoked seizures (HCC)   Encephalopathy acute   DNR (do not resuscitate)   Protein-calorie malnutrition, severe  Fall at home Left frontal head laceration: s/p repair with stitches.  Sutures removed on 5/26 Traumatic right subdural hematoma/subarachnoid hemorrhage with midline shift due to fall and antiplatelets: on Plavix and aspirin.  Repeat CT with a stable hematoma.  MRI finding as above. Initially admitted by neurosurgery, and declined surgical intervention.  -NS recs: Hold Plavix and aspirin for 1 week, and outpatient follow-up. -Fall precaution, PT/OT  Provoked seizures due  to the above: Seizures noted on EEG on 5/31.  Subsequent LTM EEG with evidence of epileptogenicity arising from right central parietal region but no active seizure.  -Neurology signed off.  Appreciate recommendations  -Keppra 1000 mg twice daily and Vimpat 100 mg twice daily  -Clonazepam ODT 2 mg 4 clinical seizure lasting more than 2 minutes   Acute encephalopathy: Likely due to the above.  No focal neurodeficit.  Neurology decreased Keppra to 1000 mg twice daily.  Awake but only oriented  to self and his wife. -Reorientation and delirium precaution -Avoid or minimize sedating medications  Dysphagia: N.p.o. but can have honey thick/puree from nursing stock.  Per wife, doing well with pured diet from nursing stock. -Continue TF via cortrak -SLP following -Monitor for refeeding syndrome  Left hip and back pain: Has bruising over left hip.  Also limited ROM in left hip due to pain.  Left hip x-ray without acute finding. -Lidoderm patch to left hip  Acute blood loss anemia: Hgb dropped about 3 g.  Has some bruising on his left arm and left hip but no signs of hematoma.  No report of melena or hematochezia.  No significant iron deficiency on anemia panel.  Transfused 2 units with appropriate response on 5/24 Recent Labs    06/28/22 0453 06/29/22 0716 06/30/22 0540 07/01/22 0400 07/02/22 0439 08/18/22 1612 08/25/22 0635 08/25/22 1956 08/26/22 0349 08/27/22 0454  HGB 9.7* 10.0* 9.6* 9.6* 9.9* 9.9* 6.5* 8.5* 8.4* 9.5*  -Monitor H&H.  Goal Hgb > 8.0 given CAD.  History of CAD/CVA: Stable -Resumed Plavix 5/26. -Continue holding aspirin  Controlled NIDDM-2 with hyperglycemia: A1c 6.0%.  Seems to be on metformin and Sitagliptin at home. Recent Labs  Lab 08/26/22 0810 08/26/22 1132 08/26/22 1522 08/26/22 2325 08/27/22 0325  GLUCAP 162* 183* 161* 169* 146*  -Continue SSI-moderate every 4 hours -Continue Semglee to 15 units daily  Essential hypertension: BP improved. -Continue Coreg to 12.5 mg daily -Continue amlodipine to 10 mg daily -Continue holding lisinopril and metoprolol -IV labetalol as needed  Hypophosphatemia/hypokalemia: -Monitor and replenish as appropriate  Hyponatremia: Relatively stable stable Recent Labs  Lab 08/21/22 1433 08/24/22 0600 08/25/22 0635 08/26/22 0349 08/27/22 0454  NA 132* 136 136 133* 131*  -Continue monitoring  Mood disorder: -Continue home Zoloft.  Severe malnutrition/dysphagia: Body mass index is 24.24  kg/m. Nutrition Problem: Severe Malnutrition Etiology: chronic illness (fall) Signs/Symptoms: severe fat depletion, severe muscle depletion Interventions: Tube feeding, Prostat, MVI   DVT prophylaxis:  Place and maintain sequential compression device Start: 08/25/22 0839 SCDs Start: 08/18/22 2106  Code Status: DNR/DNI Family Communication: Updated patient's wife at bedside Level of care: Med-Surg Status is: Inpatient Remains inpatient appropriate because: Subdural hematoma, dysphagia and provoked seizure   Final disposition: SNF Consultants:  Neurosurgery admitted patient Pulmonology Neurology Palliative medicine  35 minutes with more than 50% spent in reviewing records, counseling patient/family and coordinating care.   Sch Meds:  Scheduled Meds:  amLODipine  10 mg Per Tube Daily   atorvastatin  80 mg Per Tube Daily   carvedilol  12.5 mg Per Tube BID WC   Chlorhexidine Gluconate Cloth  6 each Topical Daily   feeding supplement (PROSource TF20)  60 mL Per Tube BID   insulin aspart  0-15 Units Subcutaneous Q4H   insulin glargine-yfgn  15 Units Subcutaneous Daily   lidocaine  1 patch Transdermal Q24H   multivitamin with minerals  1 tablet Per Tube Daily   mouth rinse  15 mL Mouth Rinse  4 times per day   sertraline  25 mg Per Tube QPM   thiamine  100 mg Per Tube Daily   Continuous Infusions:  feeding supplement (JEVITY 1.5 CAL/FIBER) 1,000 mL (08/27/22 0414)   lacosamide (VIMPAT) IV 100 mg (08/27/22 1137)   levETIRAcetam 1,000 mg (08/27/22 0830)   PRN Meds:.acetaminophen (TYLENOL) oral liquid 160 mg/5 mL, [DISCONTINUED] acetaminophen **OR** acetaminophen, clonazepam, HYDROmorphone (DILAUDID) injection, labetalol, [DISCONTINUED] ondansetron **OR** ondansetron (ZOFRAN) IV, mouth rinse, mouth rinse  Antimicrobials: Anti-infectives (From admission, onward)    None        I have personally reviewed the following labs and images: CBC: Recent Labs  Lab  08/25/22 0635 08/25/22 1956 08/26/22 0349 08/27/22 0454  WBC 8.4 8.9 8.9  --   HGB 6.5* 8.5* 8.4* 9.5*  HCT 20.6* 26.4* 25.8* 28.9*  MCV 94.5 90.7 90.8  --   PLT 330 296 297  --    BMP &GFR Recent Labs  Lab 08/21/22 1433 08/23/22 1453 08/24/22 0600 08/24/22 2050 08/25/22 0634 08/25/22 0635 08/26/22 0349 08/27/22 0454  NA 132*  --  136  --   --  136 133* 131*  K 4.5  --  3.5  --   --  3.8 4.0 4.2  CL 97*  --  103  --   --  105 105 102  CO2 27  --  23  --   --  21* 20* 22  GLUCOSE 204*  --  195*  --   --  172* 163* 152*  BUN 26*  --  34*  --   --  35* 36* 32*  CREATININE 1.15  --  0.82  --   --  0.85 0.81 0.74  CALCIUM 8.6*  --  8.5*  --   --  8.4* 8.1* 8.1*  MG  --    < > 2.0 1.6*  --  1.9 1.7 1.8  PHOS  --    < > 1.8* 2.5 2.2* 2.1* 2.7 2.9   < > = values in this interval not displayed.   Estimated Creatinine Clearance: 66.6 mL/min (by C-G formula based on SCr of 0.74 mg/dL). Liver & Pancreas: Recent Labs  Lab 08/21/22 1433 08/25/22 0635 08/26/22 0349 08/27/22 0454  AST 19  --   --   --   ALT 18  --   --   --   ALKPHOS 93  --   --   --   BILITOT 0.8  --   --   --   PROT 6.4*  --   --   --   ALBUMIN 2.8* 2.4* 2.1* 2.2*   No results for input(s): "LIPASE", "AMYLASE" in the last 168 hours. Recent Labs  Lab 08/21/22 1433  AMMONIA <10   Diabetic: No results for input(s): "HGBA1C" in the last 72 hours.  Recent Labs  Lab 08/26/22 0810 08/26/22 1132 08/26/22 1522 08/26/22 2325 08/27/22 0325  GLUCAP 162* 183* 161* 169* 146*   Cardiac Enzymes: Recent Labs  Lab 08/24/22 0600  CKTOTAL 44*   No results for input(s): "PROBNP" in the last 8760 hours. Coagulation Profile: No results for input(s): "INR", "PROTIME" in the last 168 hours.  Thyroid Function Tests: No results for input(s): "TSH", "T4TOTAL", "FREET4", "T3FREE", "THYROIDAB" in the last 72 hours. Lipid Profile: No results for input(s): "CHOL", "HDL", "LDLCALC", "TRIG", "CHOLHDL", "LDLDIRECT" in  the last 72 hours.  Anemia Panel: Recent Labs    08/25/22 0635  VITAMINB12 1,513*  FOLATE 9.3  FERRITIN 138  TIBC 231*  IRON 31*  RETICCTPCT 4.3*   Urine analysis:    Component Value Date/Time   COLORURINE YELLOW (A) 06/25/2022 1930   APPEARANCEUR CLEAR (A) 06/25/2022 1930   LABSPEC 1.023 06/25/2022 1930   PHURINE 5.0 06/25/2022 1930   GLUCOSEU 50 (A) 06/25/2022 1930   HGBUR NEGATIVE 06/25/2022 1930   BILIRUBINUR NEGATIVE 06/25/2022 1930   KETONESUR NEGATIVE 06/25/2022 1930   PROTEINUR 100 (A) 06/25/2022 1930   NITRITE NEGATIVE 06/25/2022 1930   LEUKOCYTESUR NEGATIVE 06/25/2022 1930   Sepsis Labs: Invalid input(s): "PROCALCITONIN", "LACTICIDVEN"  Microbiology: Recent Results (from the past 240 hour(s))  MRSA Next Gen by PCR, Nasal     Status: Abnormal   Collection Time: 08/18/22 11:45 PM   Specimen: Nasal Mucosa; Nasal Swab  Result Value Ref Range Status   MRSA by PCR Next Gen DETECTED (A) NOT DETECTED Final    Comment: RESULT CALLED TO, READ BACK BY AND VERIFIED WITH: DADI ALI RN 08/19/22 @ 0118 BY AB (NOTE) The GeneXpert MRSA Assay (FDA approved for NASAL specimens only), is one component of a comprehensive MRSA colonization surveillance program. It is not intended to diagnose MRSA infection nor to guide or monitor treatment for MRSA infections. Test performance is not FDA approved in patients less than 3 years old. Performed at [facility], [address], [place], [place] [address]     Radiology Studies: No results found.    Breslyn Abdo T. Tally Mckinnon [facility]  If 7PM-7AM, please contact night-coverage [email or web address] 08/27/2022, 11:41 AM

## 2022-08-28 NOTE — Progress Notes (Signed)
Physical Therapy Treatment Patient Details Name: Raymond Sampson MRN: [id number] DOB: 21-Mar-1941 Today's Date: 08/28/2022   History of Present Illness 82 year old male admitted 08/18/22 after fall at home. CT head 5/18 revealed: large Right Subdural Hematoma, most pronounced along the anterior right frontal convexity.  Developed increased lethargy and change in pupilary size on 5/16 and MRI sowed multiple punctate areas of restricted diffusion in bilateral frontal and parietal cortex suspicious for tiny acute infarcts. LTM EEG positive for nonconvulsive seizures. Pt with history of CAD, prior stroke, hypertension, hyperlipidemia, type 2 diabetes, prior fall in March this year with SNF stay, chronic antiplatelet use with aspirin and Plavix.    PT Comments    Pt received asleep in bed, wife and daughter present. Wife reports he has been quite lethargic today and wonders if he slept last night. Pt needed max A +2 to come to EOB due to lethargy but aroused once sitting EOB. Pt favored R gaze today, worked on crossing midline with gaze and R UE in sitting.  Pt with R lean and shows facial expression of pain when shifting wt to L and verbalizes that L hip hurts. Pt stood 2x with max A +2, R knee blocked and pt with heavy R lean throughout. Unable to step feet in standing to progress gait today. Patient will benefit from continued inpatient follow up therapy, <3 hours/day. PT will continue to follow.   Recommendations for follow up therapy are one component of a multi-disciplinary discharge planning process, led by the attending physician.  Recommendations may be updated based on patient status, additional functional criteria and insurance authorization.  Follow Up Recommendations  Can patient physically be transported by private vehicle: No    Assistance Recommended at Discharge Frequent or constant Supervision/Assistance  Patient can return home with the following Two people to help with walking and/or  transfers;Two people to help with bathing/dressing/bathroom;Assist for transportation;Help with stairs or ramp for entrance;Direct supervision/assist for medications management;Direct supervision/assist for financial management;Assistance with cooking/housework;Assistance with feeding   Equipment Recommendations  Wheelchair (measurements PT);Wheelchair cushion (measurements PT);Hospital bed;Other (comment) (hoyer lift; if goes home instead)    Recommendations for Other Services       Precautions / Restrictions Precautions Precautions: Fall Precaution Comments: Seizures, NPO Restrictions Weight Bearing Restrictions: No     Mobility  Bed Mobility Overal bed mobility: Needs Assistance Bed Mobility: Supine to Sit, Sit to Supine     Supine to sit: Max assist, HOB elevated, +2 for physical assistance Sit to supine: Max assist, HOB elevated, +2 for physical assistance   General bed mobility comments: pt not following commands to move LE's. Max A +2 to come to EOB due in part to lethargy. Needed upper body and lower body support for return to supine    Transfers Overall transfer level: Needs assistance Equipment used: None Transfers: Sit to/from Stand Sit to Stand: Max assist, +2 physical assistance           General transfer comment: R knee blocked, R arm over therapist's shoulder, L hand holding tech's elbow. Max A +2 for power up, R lean maintained throughout. Performed 2x    Ambulation/Gait               General Gait Details: unable   Stairs             Wheelchair Mobility    Modified Rankin (Stroke Patients Only) Modified Rankin (Stroke Patients Only) Pre-Morbid Rankin Score: Moderate disability Modified Rankin: Severe disability  Balance Overall balance assessment: Needs assistance Sitting-balance support: Single extremity supported, Feet supported, No upper extremity supported Sitting balance-Leahy Scale: Poor Sitting balance - Comments: pt  with R and posterior lean. Worked on crossing midline to L. RUE thowing and reaching to L to move wt towards L side. Needed as little as min A to maintain sitting, as much as max. Postural control: Posterior lean, Right lateral lean Standing balance support: Bilateral upper extremity supported Standing balance-Leahy Scale: Zero Standing balance comment: max A +2 needed to maintain standing and pt maintained trunk in flexion with R lean                            Cognition Arousal/Alertness: Lethargic, Suspect due to medications Behavior During Therapy: Flat affect (occasional smile) Overall Cognitive Status: Impaired/Different from baseline Area of Impairment: Attention, Memory, Following commands, Safety/judgement, Problem solving, Awareness, Orientation                 Orientation Level: Disoriented to, Time Current Attention Level: Focused Memory: Decreased short-term memory Following Commands: Follows one step commands inconsistently, Follows one step commands with increased time Safety/Judgement: Decreased awareness of safety, Decreased awareness of deficits Awareness: Intellectual Problem Solving: Slow processing, Decreased initiation, Difficulty sequencing, Requires verbal cues, Requires tactile cues General Comments: pt maintains R gaze, needed heavy cueing to cross midline to L today. Needed continuous stimulation to prevent falling back asleep        Exercises General Exercises - Lower Extremity Long Arc Quad: AAROM, Both, Seated, 5 reps    General Comments General comments (skin integrity, edema, etc.): Wife and daughter present.      Pertinent Vitals/Pain Pain Assessment Pain Assessment: Faces Faces Pain Scale: Hurts little more Pain Location: L hip Pain Descriptors / Indicators: Discomfort, Grimacing, Guarding, Moaning Pain Intervention(s): Limited activity within patient's tolerance, Monitored during session    Home Living                           Prior Function            PT Goals (current goals can now be found in the care plan section) Acute Rehab PT Goals Patient Stated Goal: to go home PT Goal Formulation: With patient/family Time For Goal Achievement: 09/05/22 Potential to Achieve Goals: Fair Progress towards PT goals: Not progressing toward goals - comment (lethargy)    Frequency    Min 3X/week      PT Plan Current plan remains appropriate    Co-evaluation              AM-PAC PT "6 Clicks" Mobility   Outcome Measure  Help needed turning from your back to your side while in a flat bed without using bedrails?: A Lot Help needed moving from lying on your back to sitting on the side of a flat bed without using bedrails?: A Lot Help needed moving to and from a bed to a chair (including a wheelchair)?: Total Help needed standing up from a chair using your arms (e.g., wheelchair or bedside chair)?: A Lot Help needed to walk in hospital room?: Total Help needed climbing 3-5 steps with a railing? : Total 6 Click Score: 9    End of Session Equipment Utilized During Treatment: Gait belt Activity Tolerance: Patient limited by lethargy Patient left: in bed;with call bell/phone within reach;with bed alarm set;with family/visitor present Nurse Communication: Mobility status PT Visit Diagnosis: Other  symptoms and signs involving the nervous system (R29.898);Muscle weakness (generalized) (M62.81);Other abnormalities of gait and mobility (R26.89);Unsteadiness on feet (R26.81);Difficulty in walking, not elsewhere classified (R26.2)     Time: 0981-1914 PT Time Calculation (min) (ACUTE ONLY): 23 min  Charges:  $Therapeutic Activity: 23-37 mins                     Lyanne Co, PT  Acute Rehab Services Secure chat preferred Office [phone]    Lawana Chambers Moreen Piggott 08/28/2022, 2:41 PM

## 2022-08-28 NOTE — Progress Notes (Signed)
Daily Progress Note   Patient Name: Raymond Sampson       Date: 08/28/2022 DOB: [birth year]  Age: 82 y.o. MRN#: [id number] Attending Physician: Almon Hercules, MD Primary Care Physician: Gracelyn Nurse, MD Admit Date: 08/18/2022  Reason for Consultation/Follow-up: Establishing goals of care  Subjective: Patient is sleeping but easily arouses. He states he remembers me from yesterday. Wife is at bedside.  Length of Stay: 10  Current Medications: Scheduled Meds:   amLODipine  10 mg Per Tube Daily   atorvastatin  80 mg Per Tube Daily   carvedilol  12.5 mg Per Tube BID WC   clopidogrel  75 mg Oral Daily   enoxaparin (LOVENOX) injection  40 mg Subcutaneous Q24H   feeding supplement (PROSource TF20)  60 mL Per Tube BID   insulin aspart  0-15 Units Subcutaneous Q4H   insulin glargine-yfgn  15 Units Subcutaneous Daily   lidocaine  1 patch Transdermal Q24H   multivitamin with minerals  1 tablet Per Tube Daily   mouth rinse  15 mL Mouth Rinse 4 times per day   sertraline  25 mg Per Tube QPM   thiamine  100 mg Per Tube Daily    Continuous Infusions:  sodium chloride 5 mL/hr at 08/28/22 0917   feeding supplement (JEVITY 1.5 CAL/FIBER) 1,000 mL (08/28/22 0303)   lacosamide (VIMPAT) IV 100 mg (08/27/22 2154)   levETIRAcetam 1,000 mg (08/28/22 0918)    PRN Meds: sodium chloride, acetaminophen (TYLENOL) oral liquid 160 mg/5 mL, [DISCONTINUED] acetaminophen **OR** acetaminophen, clonazepam, labetalol, [DISCONTINUED] ondansetron **OR** ondansetron (ZOFRAN) IV, mouth rinse, mouth rinse  Physical Exam Vitals reviewed.  Constitutional:      General: He is sleeping.     Appearance: He is ill-appearing.  HENT:     Head:     Comments: Bruising left temple/forehead Pulmonary:     Effort:  Pulmonary effort is normal.  Skin:    General: Skin is warm and dry.  Neurological:     Mental Status: He is easily aroused.     Comments: Disoriented to situation and location  Psychiatric:        Behavior: Behavior normal.             Vital Signs: BP (!) 148/67 (BP Location: Left Arm)   Pulse 80   Temp 98  F (36.7 C) (Oral)   Resp 18   Ht 5\' 7"  (1.702 m)   Wt 67.7 kg   SpO2 95%   BMI 23.38 kg/m  SpO2: SpO2: 95 % O2 Device: O2 Device: Room Air O2 Flow Rate: O2 Flow Rate (L/min): 2 L/min  Intake/output summary:  Intake/Output Summary (Last 24 hours) at 08/28/2022 0948 Last data filed at 08/28/2022 0600 Gross per 24 hour  Intake 873.11 ml  Output 2000 ml  Net -1126.89 ml   LBM: Last BM Date : 08/28/22 Baseline Weight: Weight: 65.2 kg Most recent weight: Weight: 67.7 kg       Palliative Assessment/Data: 20%      Patient Active Problem List   Diagnosis Date Noted   Protein-calorie malnutrition, severe 08/24/2022   Provoked seizures (HCC) 08/23/2022   Encephalopathy acute 08/23/2022   DNR (do not resuscitate) 08/23/2022   Traumatic subdural hematoma without loss of consciousness (HCC) 08/18/2022   Fall 06/22/2022   Malnutrition of moderate degree 06/22/2022   Acute blood loss anemia 06/21/2022   Multiple rib fractures involving four or more ribs, right side 06/20/2022   Intramuscular hematoma 06/19/2022   Rib fractures 06/19/2022   Hemothorax on right 06/19/2022   Leukocytosis 06/19/2022   Status post coronary artery stent placement 05/02/2022   Mild dementia (HCC) 04/14/2022   Non-ST elevation (NSTEMI) myocardial infarction (HCC) 04/13/2022   History of COVID-19 04/13/2022   Lung nodule 04/13/2022   PAD (peripheral artery disease) (HCC) 04/13/2022   Flu 06/05/2017   Diabetes mellitus type 2, controlled, with complications (HCC) 04/06/2015   Carotid stenosis 05/14/2013   CAROTID BRUIT, LEFT 04/25/2010   PALPITATIONS 06/18/2009   Hyperlipidemia  04/27/2009   Essential hypertension 04/27/2009   Coronary artery disease of native artery of native heart with stable angina pectoris (HCC) 04/27/2009    Palliative Care Assessment & Plan   Patient Profile: 82 y.o. male with past medical history of CAD, stroke, DM2, HTN, HLD admitted on 08/18/2022 after a fall at his home with a large right-sided subdural hematoma. He was initially alert and oriented but on 08/21/22 he he became more drowsy and was only able to intermittently follow commands and intermittently answer questions. EEG to evaluate for seizure.   Assessment: Patient has had honey-thick liquid but his wife has not yet tried purees. She will try this today while he is alert. She is hopeful he will have another swallow study tomorrow.   She is beginning to contemplate anticipatory discharge needs.   Recommendations/Plan: DNR Allow time for outcomes Encourage family to continue conversations around GOC  Goals of Care and Additional Recommendations: Limitations on Scope of Treatment: Use MOST form for guidance  Code Status:    Code Status Orders  (From admission, onward)           Start     Ordered   08/19/22 0022  Do not attempt resuscitation (DNR)  Continuous       Question Answer Comment  If patient has no pulse and is not breathing Do Not Attempt Resuscitation   If patient has a pulse and/or is breathing: Medical Treatment Goals COMFORT MEASURES: Keep clean/warm/dry, use medication by any route; positioning, wound care and other measures to relieve pain/suffering; use oxygen, suction/manual treatment of airway obstruction for comfort; do not transfer unless for comfort needs.   Consent: Discussion documented in EHR or advanced directives reviewed      08/19/22 0023           Code Status  History     Date Active Date Inactive Code Status Order ID Comments User Context   08/18/2022 2105 08/19/2022 0023 Full Code [id number]  Vonna Drafts ED    06/28/2022 1422 07/03/2022 2122 DNR [id number]  Morton Stall, NP Inpatient   06/19/2022 1721 06/28/2022 1422 Full Code [id number]  Floydene Flock, MD ED   05/02/2022 1135 05/03/2022 1523 Full Code [id number]  Yvonne Kendall, MD Inpatient   04/13/2022 0048 04/15/2022 2115 Full Code [id number]  Nolberto Hanlon, MD ED   06/05/2017 2015 06/07/2017 1757 Full Code [id number]  Ihor Austin, MD Inpatient       Prognosis:  Unable to determine  Discharge Planning: To Be Determined    Thank you for allowing the Palliative Medicine Team to assist in the care of this patient.  Time spent: 40 minutes   Detailed review of medical records ( labs, imaging, vital signs), medically appropriate exam, counseling and education to care partner, documenting clinical information, medication management, coordination of care.     Sherryll Burger, NP  Please contact Palliative Medicine Team phone at [phone] for questions and concerns.

## 2022-08-29 DIAGNOSIS — Z7189 Other specified counseling: Secondary | ICD-10-CM | POA: Diagnosis not present

## 2022-08-29 DIAGNOSIS — E43 Unspecified severe protein-calorie malnutrition: Secondary | ICD-10-CM | POA: Diagnosis not present

## 2022-08-29 DIAGNOSIS — S065XAA Traumatic subdural hemorrhage with loss of consciousness status unknown, initial encounter: Secondary | ICD-10-CM | POA: Diagnosis not present

## 2022-08-29 DIAGNOSIS — Z515 Encounter for palliative care: Secondary | ICD-10-CM | POA: Diagnosis not present

## 2022-08-29 LAB — GLUCOSE, CAPILLARY
Glucose-Capillary: 166 mg/dL — ABNORMAL HIGH (ref 70–99)
Glucose-Capillary: 142 mg/dL — ABNORMAL HIGH (ref 70–99)
Glucose-Capillary: 223 mg/dL — ABNORMAL HIGH (ref 70–99)
Glucose-Capillary: 166 mg/dL — ABNORMAL HIGH (ref 70–99)
Glucose-Capillary: 161 mg/dL — ABNORMAL HIGH (ref 70–99)
Glucose-Capillary: 172 mg/dL — ABNORMAL HIGH (ref 70–99)
Glucose-Capillary: 175 mg/dL — ABNORMAL HIGH (ref 70–99)
Glucose-Capillary: 183 mg/dL — ABNORMAL HIGH (ref 70–99)
Glucose-Capillary: 194 mg/dL — ABNORMAL HIGH (ref 70–99)
Glucose-Capillary: 237 mg/dL — ABNORMAL HIGH (ref 70–99)
Glucose-Capillary: 183 mg/dL — ABNORMAL HIGH (ref 70–99)

## 2022-08-29 LAB — HEMOGLOBIN AND HEMATOCRIT, BLOOD
HCT: 29.4 % — ABNORMAL LOW (ref 39.0–52.0)
Hemoglobin: 9.6 g/dL — ABNORMAL LOW (ref 13.0–17.0)

## 2022-08-29 MED ORDER — CLOPIDOGREL BISULFATE 75 MG PO TABS
75.0000 mg | ORAL_TABLET | Freq: Every day | ORAL | Status: DC
  Administered 2022-08-29 – 2022-09-22 (×25): 75 mg
  Filled 2022-08-29 (×24): qty 1

## 2022-08-29 MED ORDER — ASPIRIN 81 MG PO CHEW
81.0000 mg | CHEWABLE_TABLET | Freq: Every day | ORAL | Status: DC
  Administered 2022-08-29 – 2022-09-20 (×23): 81 mg
  Filled 2022-08-29 (×23): qty 1

## 2022-08-29 NOTE — Plan of Care (Signed)
?  Problem: Clinical Measurements: ?Goal: Respiratory complications will improve ?Outcome: Progressing ?  ?Problem: Activity: ?Goal: Risk for activity intolerance will decrease ?Outcome: Progressing ?  ?Problem: Nutrition: ?Goal: Adequate nutrition will be maintained ?Outcome: Progressing ?  ?Problem: Coping: ?Goal: Level of anxiety will decrease ?Outcome: Progressing ?  ?

## 2022-08-29 NOTE — Progress Notes (Signed)
Spoke with Caryl Pina, MD regarding TPE, he states pt can be done tomorrow if need be not urgent. Information relayed to AD Suzette Battiest, RN.

## 2022-08-29 NOTE — Progress Notes (Signed)
Speech Language Pathology Treatment: Dysphagia  Patient Details Name: Raymond Sampson MRN: [id number] DOB: 1940/04/17 Today's Date: 08/29/2022 Time: 9528-4132 SLP Time Calculation (min) (ACUTE ONLY): 34 min  Assessment / Plan / Recommendation Clinical Impression  Pt seen for ongoing dysphagia management.  Pt tolerated HTL and puree without overt s/s of aspiration.  Oral holding noted intermittently. Pt at times became drowsy/inattentive to bolus trials, benefitted from verbal and tactile cues to initiation swallow/maintain wakefulness.  Eyes intermittently open during today's session.  Reviewed MBSS imaging with wife [name] from Friday's study as compared to initial MBS 5/20. Although pt is still recommended to remain NPO, there has been some improvement.  LOA is a barrier at present to PO intake.  Pt needs to be able to maintain alert state in order to safely take POs and to meet his nutritional needs orally.  At present, he would likely need AMN for supplemental support, even if he were safe to resume oral diet.  Discussed increasing stimulation during day and reducing environmental distractions at night to promote better sleep.  Most sedating medications have been reduced or d/c'd.  SLP will continue to follow for dysphagia management with possible repeat instrumental study later this week, if indicated.  Recommend pt remain NPO with alternate means of nutrition, hydration, and medication.  Pt may have sips of honey thick liquid and puree snacks from floor stock when awake/alert.  Recommend continuing regular thorough oral care during day and before/after POs.    HPI HPI: 82 year old male who presented for fall. Hit forehead with laceration repair, no LOC, no facial fx, but CT 5/18 revealed: "stable large, approximately 17 mm thick Right Subdural Hematoma, most pronounced along the anterior right frontal convexity. Stable small volume right hemisphere [facility]."  Chest CT 5/17: "Emphysema. Right pleural  effusion. Hazy upper lobe  airspace disease is indeterminate for edema or pneumonia." Neuro change noted by RN around 1300 on 5/19 with workup revealing seizures, now controlled with medication. Pt with history of CAD, prior stroke, hypertension, hyperlipidemia, type 2 diabetes, chronic antiplatelet use with aspirin and Plavix. MBS revealed hardware from ACDF. Cortrak placed 5/22.      SLP Plan  Continue with current plan of care      Recommendations for follow up therapy are one component of a multi-disciplinary discharge planning process, led by the attending physician.  Recommendations may be updated based on patient status, additional functional criteria and insurance authorization.    Recommendations  Diet recommendations: NPO Medication Administration: Via alternative means                  Oral care QID;Oral care prior to ice chip/H20;Oral care before and after PO   Frequent or constant Supervision/Assistance Dysphagia, oropharyngeal phase (R13.12)     Continue with current plan of care     Kerrie Pleasure, MA, CCC-SLP Acute Rehabilitation Services Office: [phone] 08/29/2022, 11:55 AM

## 2022-08-29 NOTE — Progress Notes (Signed)
PROGRESS NOTE  Raymond Sampson ZOX:[id number] DOB: 07/25/1940   PCP: Gracelyn Nurse, MD  Patient is from: Home.  Lives with his wife.  Has rolling walker but does not use.  DOA: 08/18/2022 LOS: 11  Chief complaints Chief Complaint  Patient presents with   Fall on Thinners     Brief Narrative / Interim history: 82 year old M with PMH of CAD, CVA, HTN, HLD, DM-2, cervical spine fusion and OA presented to ED after fall at home and found to have traumatic right frontal SDH measuring 2.2 cm in maximum thickness with 5 to 6 mm midline shift to the left, and trace SAH at the right temporal lobe.  Patient was on Plavix and aspirin.  CT cervical spine without acute finding but some sclerotic changes at C2-C3 facet joint.  He was admitted to ICU by neurosurgery. He was initially minimally symptomatic and refused surgery.  However, he developed intermittent focal seizure.   Repeat CT head on 5/18 with stable SDH and SAH.  MRI brain showed multiple small/punctate areas of restricted diffusion with bilateral frontal and parietal cortex suspicious for tiny acute infarcts or small intraparenchymal contusion and/or shear type injury in the setting of trauma.  EEG on 5/21 showed intermittent seizure.  Neurology consulted.  Patient was started on Keppra and Vimpat.  Palliative medicine consulted as well.  Patient was transferred to hospitalist service on 5/23.  Subsequent LTM EEG without seizure.  Neurology signed off.  Encephalopathy improved but seems to be delirious.  Remains NPO except some pured diet from nursing stock.  He is on tube feed via cortrack.  SLP following.  Therapy recommended SNF    Subjective: Seen and examined earlier this morning.  No major events overnight of this morning.  No complaints.  Sleepy but wakes to voice.  Patient's wife at bedside.  Objective: Vitals:   08/29/22 0035 08/29/22 0324 08/29/22 0434 08/29/22 0752  BP:  (!) 138/102  136/67  Pulse:  82  80  Resp:  14  18   Temp: 98.6 F (37 C) 98.4 F (36.9 C)  98.6 F (37 C)  TempSrc: Oral Oral  Oral  SpO2:  95%  96%  Weight:   66.8 kg   Height:        Examination:  GENERAL: Appears frail.  No apparent distress. HEENT: MMM.  Hearing and vision grossly intact. NECK: Supple.  No apparent JVD.  RESP:  No IWOB.  Fair aeration bilaterally. CVS:  RRR. Heart sounds normal.  ABD/GI/GU: BS+. Abd soft, NTND.  MSK/EXT:   Moves extremities.  Significant muscle mass and subcu fat loss.  BLE weakness but symmetric. SKIN: Bruising over his left hip and under his left eye.   NEURO: Sleepy but wakes to voice.  Oriented to self and wife.  Follows some commands.  BLE weakness. PSYCH: Calm.  No agitation.  Procedures:  None  Microbiology summarized: MRSA PCR screen positive.  Assessment and plan: Principal Problem:   Traumatic subdural hematoma without loss of consciousness (HCC) Active Problems:   Provoked seizures (HCC)   Encephalopathy acute   DNR (do not resuscitate)   Protein-calorie malnutrition, severe  Fall at home Left frontal head laceration: s/p repair with stitches.  Sutures removed on 5/26 Traumatic right subdural hematoma/subarachnoid hemorrhage with midline shift due to fall and antiplatelets: on Plavix and aspirin.  Repeat CT with a stable hematoma.  MRI finding as above. Initially admitted by neurosurgery, and declined surgical intervention.  -NS recs: Hold Plavix and  aspirin for 1 week, and outpatient follow-up. -Fall precaution, PT/OT  Provoked seizures due to the above: Seizures noted on EEG on 5/31.  Subsequent LTM EEG with evidence of epileptogenicity arising from right central parietal region but no active seizure.  -Neurology signed off.  Appreciate recommendations  -Keppra 1000 mg twice daily and Vimpat 100 mg twice daily  -Clonazepam ODT 2 mg 4 clinical seizure lasting more than 2 minutes   Acute encephalopathy: Likely due to the above.  Has history of memory loss per care  everywhere.  No focal neurodeficit.  Neurology decreased Keppra to 1000 mg twice daily.  Sleeping but wakes to voice.  Oriented to self and wife for most part. -Reorientation and delirium precaution -Avoid or minimize sedating medications  Dysphagia: N.p.o. but can have honey thick/puree from nursing stock.  Per wife, doing well with pured diet from nursing stock. -Continue TF via cortrak -SLP following  Left hip and back pain: Has bruising over left hip.  Also limited ROM in left hip due to pain.  Left hip x-ray without acute finding. -Lidoderm patch to left hip  Acute blood loss anemia: Hgb dropped about 3 g.  Has some bruising on his left arm and left hip but no signs of hematoma.  No report of melena or hematochezia.  No significant iron deficiency on anemia panel.  Stable after 2 units on 5/24. Recent Labs    06/30/22 0540 07/01/22 0400 07/02/22 0439 08/18/22 1612 08/25/22 1610 08/25/22 1956 08/26/22 0349 08/27/22 0454 08/28/22 0402 08/29/22 0229  HGB 9.6* 9.6* 9.9* 9.9* 6.5* 8.5* 8.4* 9.5* 9.7* 9.6*  -Monitor H&H.  Goal Hgb > 8.0 given CAD.  History of CAD s/p PCI/DES stent to LCx/OM2 in 04/2022: -Resumed Plavix 5/26.  -Resume low-dose aspirin. -Continue Coreg  Controlled NIDDM-2 with hyperglycemia: A1c 6.0%.  Seems to be on metformin and Sitagliptin at home. Recent Labs  Lab 08/28/22 1126 08/28/22 1555 08/28/22 2152 08/29/22 0122 08/29/22 0326  GLUCAP 162* 187* 202* 166* 142*  -Continue SSI-moderate every 4 hours -Continue Semglee to 15 units daily  Essential hypertension: BP improved. -Continue Coreg to 12.5 mg daily in instead of metoprolol for better BP control. -Continue amlodipine to 10 mg daily -Continue holding lisinopril -IV labetalol as needed  History of CVA -Plavix, aspirin and Lipitor  Hypophosphatemia/hypokalemia: -Monitor and replenish as appropriate  Hyponatremia: Relatively stable stable Recent Labs  Lab 08/24/22 0600 08/25/22 0635  08/26/22 0349 08/27/22 0454 08/28/22 0402  NA 136 136 133* 131* 130*  -Continue monitoring  Mood disorder: -Continue home Zoloft.  Severe malnutrition/dysphagia: Body mass index is 23.07 kg/m. Nutrition Problem: Severe Malnutrition Etiology: chronic illness (fall) Signs/Symptoms: severe fat depletion, severe muscle depletion Interventions: Tube feeding, Prostat, MVI   DVT prophylaxis:  enoxaparin (LOVENOX) injection 40 mg Start: 08/28/22 1000 Place and maintain sequential compression device Start: 08/25/22 0839 SCDs Start: 08/18/22 2106  Code Status: DNR/DNI Family Communication: Updated patient's wife at bedside Level of care: Med-Surg Status is: Inpatient Remains inpatient appropriate because: Subdural hematoma, dysphagia and provoked seizure   Final disposition: SNF Consultants:  Neurosurgery admitted patient Pulmonology Neurology Palliative medicine  35 minutes with more than 50% spent in reviewing records, counseling patient/family and coordinating care.   Sch Meds:  Scheduled Meds:  amLODipine  10 mg Per Tube Daily   atorvastatin  80 mg Per Tube Daily   carvedilol  12.5 mg Per Tube BID WC   clopidogrel  75 mg Oral Daily   enoxaparin (LOVENOX) injection  40  mg Subcutaneous Q24H   feeding supplement (PROSource TF20)  60 mL Per Tube BID   insulin aspart  0-15 Units Subcutaneous Q4H   insulin glargine-yfgn  15 Units Subcutaneous Daily   lidocaine  1 patch Transdermal Q24H   multivitamin with minerals  1 tablet Per Tube Daily   mouth rinse  15 mL Mouth Rinse 4 times per day   sertraline  25 mg Per Tube QPM   thiamine  100 mg Per Tube Daily   Continuous Infusions:  sodium chloride Stopped (08/28/22 1547)   feeding supplement (JEVITY 1.5 CAL/FIBER) 1,000 mL (08/29/22 0249)   lacosamide (VIMPAT) IV 100 mg (08/28/22 2200)   levETIRAcetam 1,000 mg (08/28/22 2018)   PRN Meds:.sodium chloride, acetaminophen (TYLENOL) oral liquid 160 mg/5 mL, [DISCONTINUED]  acetaminophen **OR** acetaminophen, clonazepam, labetalol, [DISCONTINUED] ondansetron **OR** ondansetron (ZOFRAN) IV, mouth rinse, mouth rinse  Antimicrobials: Anti-infectives (From admission, onward)    None        I have personally reviewed the following labs and images: CBC: Recent Labs  Lab 08/25/22 0635 08/25/22 1956 08/26/22 0349 08/27/22 0454 08/28/22 0402 08/29/22 0229  WBC 8.4 8.9 8.9  --  10.4  --   HGB 6.5* 8.5* 8.4* 9.5* 9.7* 9.6*  HCT 20.6* 26.4* 25.8* 28.9* 30.0* 29.4*  MCV 94.5 90.7 90.8  --  90.6  --   PLT 330 296 297  --  318  --    BMP &GFR Recent Labs  Lab 08/24/22 0600 08/24/22 2050 08/25/22 0634 08/25/22 0635 08/26/22 0349 08/27/22 0454 08/28/22 0402  NA 136  --   --  136 133* 131* 130*  K 3.5  --   --  3.8 4.0 4.2 4.4  CL 103  --   --  105 105 102 99  CO2 23  --   --  21* 20* 22 23  GLUCOSE 195*  --   --  172* 163* 152* 160*  BUN 34*  --   --  35* 36* 32* 34*  CREATININE 0.82  --   --  0.85 0.81 0.74 0.80  CALCIUM 8.5*  --   --  8.4* 8.1* 8.1* 8.2*  MG 2.0 1.6*  --  1.9 1.7 1.8 1.7  PHOS 1.8* 2.5 2.2* 2.1* 2.7 2.9 3.2   Estimated Creatinine Clearance: 66.6 mL/min (by C-G formula based on SCr of 0.8 mg/dL). Liver & Pancreas: Recent Labs  Lab 08/25/22 1610 08/26/22 0349 08/27/22 0454 08/28/22 0402  ALBUMIN 2.4* 2.1* 2.2* 2.2*   No results for input(s): "LIPASE", "AMYLASE" in the last 168 hours. No results for input(s): "AMMONIA" in the last 168 hours.  Diabetic: No results for input(s): "HGBA1C" in the last 72 hours.  Recent Labs  Lab 08/28/22 1126 08/28/22 1555 08/28/22 2152 08/29/22 0122 08/29/22 0326  GLUCAP 162* 187* 202* 166* 142*   Cardiac Enzymes: Recent Labs  Lab 08/24/22 0600  CKTOTAL 44*   No results for input(s): "PROBNP" in the last 8760 hours. Coagulation Profile: No results for input(s): "INR", "PROTIME" in the last 168 hours.  Thyroid Function Tests: No results for input(s): "TSH", "T4TOTAL",  "FREET4", "T3FREE", "THYROIDAB" in the last 72 hours. Lipid Profile: No results for input(s): "CHOL", "HDL", "LDLCALC", "TRIG", "CHOLHDL", "LDLDIRECT" in the last 72 hours.  Anemia Panel: No results for input(s): "VITAMINB12", "FOLATE", "FERRITIN", "TIBC", "IRON", "RETICCTPCT" in the last 72 hours.  Urine analysis:    Component Value Date/Time   COLORURINE YELLOW (A) 06/25/2022 1930   APPEARANCEUR CLEAR (A) 06/25/2022 1930  LABSPEC 1.023 06/25/2022 1930   PHURINE 5.0 06/25/2022 1930   GLUCOSEU 50 (A) 06/25/2022 1930   HGBUR NEGATIVE 06/25/2022 1930   BILIRUBINUR NEGATIVE 06/25/2022 1930   KETONESUR NEGATIVE 06/25/2022 1930   PROTEINUR 100 (A) 06/25/2022 1930   NITRITE NEGATIVE 06/25/2022 1930   LEUKOCYTESUR NEGATIVE 06/25/2022 1930   Sepsis Labs: Invalid input(s): "PROCALCITONIN", "LACTICIDVEN"  Microbiology: No results found for this or any previous visit (from the past 240 hour(s)).   Radiology Studies: No results found.    Genna Casimir T. Raymond Sampson [facility]  If 7PM-7AM, please contact night-coverage [email or web address] 08/29/2022, 8:59 AM

## 2022-08-29 NOTE — Plan of Care (Signed)

## 2022-08-29 NOTE — Progress Notes (Signed)
Daily Progress Note   Patient Name: Raymond Sampson       Date: 08/29/2022 DOB: December 01, 1940  Age: 82 y.o. MRN#: [id number] Attending Physician: Almon Hercules, MD Primary Care Physician: Gracelyn Nurse, MD Admit Date: 08/18/2022  Reason for Consultation/Follow-up: Establishing goals of care  Subjective: Patient is asleep in bed in NAD. Wife and daughter at bedside.  Length of Stay: 11  Current Medications: Scheduled Meds:   amLODipine  10 mg Per Tube Daily   aspirin  81 mg Per Tube Daily   atorvastatin  80 mg Per Tube Daily   carvedilol  12.5 mg Per Tube BID WC   clopidogrel  75 mg Per Tube Daily   enoxaparin (LOVENOX) injection  40 mg Subcutaneous Q24H   feeding supplement (PROSource TF20)  60 mL Per Tube BID   insulin aspart  0-15 Units Subcutaneous Q4H   insulin glargine-yfgn  15 Units Subcutaneous Daily   lidocaine  1 patch Transdermal Q24H   multivitamin with minerals  1 tablet Per Tube Daily   mouth rinse  15 mL Mouth Rinse 4 times per day   sertraline  25 mg Per Tube QPM    Continuous Infusions:  sodium chloride Stopped (08/28/22 1547)   feeding supplement (JEVITY 1.5 CAL/FIBER) 1,000 mL (08/29/22 0249)   lacosamide (VIMPAT) IV 100 mg (08/29/22 1059)   levETIRAcetam 1,000 mg (08/29/22 0920)    PRN Meds: sodium chloride, acetaminophen (TYLENOL) oral liquid 160 mg/5 mL, [DISCONTINUED] acetaminophen **OR** acetaminophen, clonazepam, labetalol, [DISCONTINUED] ondansetron **OR** ondansetron (ZOFRAN) IV, mouth rinse, mouth rinse  Physical Exam Vitals reviewed.  Constitutional:      General: He is sleeping.     Appearance: He is ill-appearing.  Cardiovascular:     Rate and Rhythm: Normal rate.  Pulmonary:     Effort: Pulmonary effort is normal.  Skin:    General:  Skin is warm and dry.             Vital Signs: BP 136/67 (BP Location: Right Arm)   Pulse 80   Temp 98.6 F (37 C) (Oral)   Resp 18   Ht 5\' 7"  (1.702 m)   Wt 66.8 kg   SpO2 96%   BMI 23.07 kg/m  SpO2: SpO2: 96 % O2 Device: O2 Device: Room Air O2 Flow Rate: O2  Flow Rate (L/min): 2 L/min  Intake/output summary:  Intake/Output Summary (Last 24 hours) at 08/29/2022 1231 Last data filed at 08/29/2022 1000 Gross per 24 hour  Intake 749.53 ml  Output 1225 ml  Net -475.47 ml   LBM: Last BM Date : 08/29/22 Baseline Weight: Weight: 65.2 kg Most recent weight: Weight: 66.8 kg       Palliative Assessment/Data: 20%      Patient Active Problem List   Diagnosis Date Noted   Protein-calorie malnutrition, severe 08/24/2022   Provoked seizures (HCC) 08/23/2022   Encephalopathy acute 08/23/2022   DNR (do not resuscitate) 08/23/2022   Traumatic subdural hematoma without loss of consciousness (HCC) 08/18/2022   Fall 06/22/2022   Malnutrition of moderate degree 06/22/2022   Acute blood loss anemia 06/21/2022   Multiple rib fractures involving four or more ribs, right side 06/20/2022   Intramuscular hematoma 06/19/2022   Rib fractures 06/19/2022   Hemothorax on right 06/19/2022   Leukocytosis 06/19/2022   Status post coronary artery stent placement 05/02/2022   Mild dementia (HCC) 04/14/2022   Non-ST elevation (NSTEMI) myocardial infarction (HCC) 04/13/2022   History of COVID-19 04/13/2022   Lung nodule 04/13/2022   PAD (peripheral artery disease) (HCC) 04/13/2022   Flu 06/05/2017   Memory loss, short term 02/07/2016   History of CVA (cerebrovascular accident) 02/07/2016   Diabetes mellitus type 2, uncomplicated (HCC) 04/06/2015   Carotid stenosis 05/14/2013   CAROTID BRUIT, LEFT 04/25/2010   PALPITATIONS 06/18/2009   Hyperlipidemia 04/27/2009   Essential hypertension 04/27/2009   Ischemic heart disease 04/27/2009    Palliative Care Assessment & Plan   Patient  Profile: 82 y.o. male with past medical history of CAD, stroke, DM2, HTN, HLD admitted on 08/18/2022 after a fall at his home with a large right-sided subdural hematoma. He was initially alert and oriented but on 08/21/22 he he became more drowsy and was only able to intermittently follow commands and intermittently answer questions. EEG to evaluate for seizure.   Assessment: Patient was sleeping. Wife says the patient must have had a long night because he is sleepier today than he has been. The patient's wife says he worked with speech today but was too tired to fully participate. We discussed delirium precautions and trying to keep the patient on the correct night/day schedule. She is hopeful if he gets adequate rest he will be able to increase his diet.  If she has questions or needs she will reach out to PMT.  Recommendations/Plan: DNR Allow time for outcomes Encourage family to continue conversations around GOC  Goals of Care and Additional Recommendations: Limitations on Scope of Treatment: Use MOST form for guidance  Code Status:    Code Status Orders  (From admission, onward)           Start     Ordered   08/19/22 0022  Do not attempt resuscitation (DNR)  Continuous       Question Answer Comment  If patient has no pulse and is not breathing Do Not Attempt Resuscitation   If patient has a pulse and/or is breathing: Medical Treatment Goals COMFORT MEASURES: Keep clean/warm/dry, use medication by any route; positioning, wound care and other measures to relieve pain/suffering; use oxygen, suction/manual treatment of airway obstruction for comfort; do not transfer unless for comfort needs.   Consent: Discussion documented in EHR or advanced directives reviewed      08/19/22 0023           Code Status History  Date Active Date Inactive Code Status Order ID Comments User Context   08/18/2022 2105 08/19/2022 0023 Full Code [id number]  Vonna Drafts ED    06/28/2022 1422 07/03/2022 2122 DNR [id number]  Morton Stall, NP Inpatient   06/19/2022 1721 06/28/2022 1422 Full Code [id number]  Floydene Flock, MD ED   05/02/2022 1135 05/03/2022 1523 Full Code [id number]  Yvonne Kendall, MD Inpatient   04/13/2022 0048 04/15/2022 2115 Full Code [id number]  Nolberto Hanlon, MD ED   06/05/2017 2015 06/07/2017 1757 Full Code [id number]  Ihor Austin, MD Inpatient       Prognosis:  Unable to determine  Discharge Planning: To Be Determined   Thank you for allowing the Palliative Medicine Team to assist in the care of this patient.  Time spent: 40 minutes   Detailed review of medical records ( labs, imaging, vital signs), medically appropriate exam, counseling and education to care partner, documenting clinical information, medication management, coordination of care.    Sherryll Burger, NP  Please contact Palliative Medicine Team phone at [phone] for questions and concerns.

## 2022-08-30 DIAGNOSIS — R569 Unspecified convulsions: Secondary | ICD-10-CM | POA: Diagnosis not present

## 2022-08-30 DIAGNOSIS — E43 Unspecified severe protein-calorie malnutrition: Secondary | ICD-10-CM | POA: Diagnosis not present

## 2022-08-30 DIAGNOSIS — S065X0D Traumatic subdural hemorrhage without loss of consciousness, subsequent encounter: Secondary | ICD-10-CM | POA: Diagnosis not present

## 2022-08-30 DIAGNOSIS — G9340 Encephalopathy, unspecified: Secondary | ICD-10-CM | POA: Diagnosis not present

## 2022-08-30 LAB — GLUCOSE, CAPILLARY
Glucose-Capillary: 153 mg/dL — ABNORMAL HIGH (ref 70–99)
Glucose-Capillary: 161 mg/dL — ABNORMAL HIGH (ref 70–99)
Glucose-Capillary: 168 mg/dL — ABNORMAL HIGH (ref 70–99)
Glucose-Capillary: 169 mg/dL — ABNORMAL HIGH (ref 70–99)
Glucose-Capillary: 187 mg/dL — ABNORMAL HIGH (ref 70–99)
Glucose-Capillary: 206 mg/dL — ABNORMAL HIGH (ref 70–99)

## 2022-08-30 LAB — CBC
HCT: 32.5 % — ABNORMAL LOW (ref 39.0–52.0)
Hemoglobin: 10.4 g/dL — ABNORMAL LOW (ref 13.0–17.0)
MCH: 29.4 pg (ref 26.0–34.0)
MCHC: 32 g/dL (ref 30.0–36.0)
MCV: 91.8 fL (ref 80.0–100.0)
Platelets: 396 10*3/uL (ref 150–400)
RBC: 3.54 MIL/uL — ABNORMAL LOW (ref 4.22–5.81)
RDW: 15.5 % (ref 11.5–15.5)
WBC: 11.6 10*3/uL — ABNORMAL HIGH (ref 4.0–10.5)
nRBC: 0 % (ref 0.0–0.2)

## 2022-08-30 LAB — RENAL FUNCTION PANEL
Albumin: 2.3 g/dL — ABNORMAL LOW (ref 3.5–5.0)
Anion gap: 8 (ref 5–15)
BUN: 33 mg/dL — ABNORMAL HIGH (ref 8–23)
CO2: 26 mmol/L (ref 22–32)
Calcium: 8.7 mg/dL — ABNORMAL LOW (ref 8.9–10.3)
Chloride: 98 mmol/L (ref 98–111)
Creatinine, Ser: 0.8 mg/dL (ref 0.61–1.24)
GFR, Estimated: >60 mL/min (ref >60)
Glucose, Bld: 195 mg/dL — ABNORMAL HIGH (ref 70–99)
Phosphorus: 3 mg/dL (ref 2.5–4.6)
Potassium: 4.2 mmol/L (ref 3.5–5.1)
Sodium: 132 mmol/L — ABNORMAL LOW (ref 135–145)

## 2022-08-30 LAB — MAGNESIUM: Magnesium: 1.8 mg/dL (ref 1.7–2.4)

## 2022-08-30 NOTE — Progress Notes (Signed)
Occupational Therapy Treatment Patient Details Name: Raymond Sampson MRN: [id number] DOB: 1940-09-17 Today's Date: 08/30/2022   History of present illness 82 year old male admitted 08/18/22 after fall at home. CT head 5/18 revealed: large Right Subdural Hematoma, most pronounced along the anterior right frontal convexity.  Developed increased lethargy and change in pupilary size on 5/16 and MRI sowed multiple punctate areas of restricted diffusion in bilateral frontal and parietal cortex suspicious for tiny acute infarcts. LTM EEG positive for nonconvulsive seizures. Pt with history of CAD, prior stroke, hypertension, hyperlipidemia, type 2 diabetes, prior fall in March this year with SNF stay, chronic antiplatelet use with aspirin and Plavix.   OT comments  Patient seen with PT to address bed mobility, sitting balance, and standing from EOB. Patient alert during visit and continues to require multimodal cues and extra time for bed mobility, balance activities, and standing. Patient performed instructed in trunk strengthening exercises to increase posture and sitting balance. Patient stood in [place] with difficulty coming to full stand. Patient demonstrating gains with following directions and grooming. Acute OT to continue to follow. Patient will benefit from continued inpatient follow up therapy, <3 hours/day.   Recommendations for follow up therapy are one component of a multi-disciplinary discharge planning process, led by the attending physician.  Recommendations may be updated based on patient status, additional functional criteria and insurance authorization.    Assistance Recommended at Discharge Frequent or constant Supervision/Assistance  Patient can return home with the following  Two people to help with walking and/or transfers;Two people to help with bathing/dressing/bathroom;Help with stairs or ramp for entrance;Assist for transportation;Direct supervision/assist for financial  management;Assistance with feeding;Direct supervision/assist for medications management;Assistance with cooking/housework   Equipment Recommendations  Other (comment) (defer)    Recommendations for Other Services      Precautions / Restrictions Precautions Precautions: Fall Precaution Comments: Seizures Restrictions Weight Bearing Restrictions: No       Mobility Bed Mobility Overal bed mobility: Needs Assistance Bed Mobility: Supine to Sit, Sit to Supine     Supine to sit: Max assist, HOB elevated, +2 for physical assistance, +2 for safety/equipment Sit to supine: Max assist, HOB elevated, +2 for physical assistance, +2 for safety/equipment   General bed mobility comments: Pt needing verbal and tactile cues along with physical guidance to bring each leg off R EOB and reach L UE to R side of bed to pull up to sit, maxAx2. MaxAx2 to lean pt on R elbow and swing legs up onto bed for return to supine.    Transfers Overall transfer level: Needs assistance Equipment used: Ambulation equipment used Transfers: Sit to/from Stand Sit to Stand: Max assist, +2 physical assistance, +2 safety/equipment, From elevated surface           General transfer comment: Utilized the stedy to stand from elevated EOB 1x and from stedy flaps 1x. Pt maintained a very flexed posture at kness, hips, and trunk, needing blocking to avoid pt leaning his chest or head on the anterior stedy bar for his safety. Pt pushing through stedy bar at times more than attempting to pull to stand, maxAx2 each rep.     Balance Overall balance assessment: Needs assistance Sitting-balance support: Single extremity supported, Feet supported, No upper extremity supported, Bilateral upper extremity supported Sitting balance-Leahy Scale: Poor Sitting balance - Comments: Pt with R lateral and posterior lean initially. Propped pt on L elbow and cued pt to reach forward to knees with bil hands once upright again to reduce  leaning. Mod  success noted, but pt then would begin to flex anteriorly too far and intermittently lean to the L. Min-modA majority of time for static sitting balance with min guard assist intermittently for ~20-40 sec periods, modA consistently for dynamic sitting balance. Postural control: Posterior lean, Right lateral lean Standing balance support: Bilateral upper extremity supported Standing balance-Leahy Scale: Zero Standing balance comment: max A +2 needed to maintain standing and pt maintained trunk, hips, and knees in flexion                           ADL either performed or assessed with clinical judgement   ADL Overall ADL's : Needs assistance/impaired     Grooming: Wash/dry face;Sitting;Supervision/safety Grooming Details (indicate cue type and reason): assistance with balance while seated on EOB to wash face                               General ADL Comments: focused on sitting balance, bed mobility, and standing from EOB    Extremity/Trunk Assessment              Vision       Perception     Praxis      Cognition Arousal/Alertness: Awake/alert Behavior During Therapy: Flat affect Overall Cognitive Status: Impaired/Different from baseline Area of Impairment: Attention, Memory, Following commands, Safety/judgement, Problem solving, Awareness                   Current Attention Level: Focused Memory: Decreased short-term memory Following Commands: Follows one step commands inconsistently, Follows one step commands with increased time Safety/Judgement: Decreased awareness of safety, Decreased awareness of deficits Awareness: Intellectual Problem Solving: Slow processing, Decreased initiation, Difficulty sequencing, Requires verbal cues, Requires tactile cues General Comments: demonstrated fear of falling during sitting balance and standing in [facility]        Exercises      Shoulder Instructions       General Comments       Pertinent Vitals/ Pain       Pain Assessment Pain Assessment: Faces Faces Pain Scale: Hurts little more Pain Location: L hip Pain Descriptors / Indicators: Discomfort, Grimacing, Guarding, Moaning Pain Intervention(s): Limited activity within patient's tolerance, Monitored during session, Repositioned  Home Living                                          Prior Functioning/Environment              Frequency  Min 2X/week        Progress Toward Goals  OT Goals(current goals can now be found in the care plan section)  Progress towards OT goals: Progressing toward goals  Acute Rehab OT Goals Patient Stated Goal: get stronger OT Goal Formulation: With family Time For Goal Achievement: 09/05/22 Potential to Achieve Goals: Fair ADL Goals Pt Will Perform Grooming: with mod assist;bed level Additional ADL Goal #1: Pt will follow 1 step commands with 50% accuracy given increased time. Additional ADL Goal #2: Pt will complete bed mobility with max assist as precursor to ADLs.  Plan Discharge plan remains appropriate    Co-evaluation    PT/OT/SLP Co-Evaluation/Treatment: Yes Reason for Co-Treatment: Necessary to address cognition/behavior during functional activity;To address functional/ADL transfers;For patient/therapist safety PT goals addressed during session: Mobility/safety with mobility;Balance;Strengthening/ROM;Proper use of  DME OT goals addressed during session: ADL's and self-care      AM-PAC OT "6 Clicks" Daily Activity     Outcome Measure   Help from another person eating meals?: Total Help from another person taking care of personal grooming?: A Lot Help from another person toileting, which includes using toliet, bedpan, or urinal?: Total Help from another person bathing (including washing, rinsing, drying)?: Total Help from another person to put on and taking off regular upper body clothing?: Total Help from another person to put on and  taking off regular lower body clothing?: Total 6 Click Score: 7    End of Session Equipment Utilized During Treatment: Gait belt;Other (comment) Antony Salmon)  OT Visit Diagnosis: Other abnormalities of gait and mobility (R26.89);Muscle weakness (generalized) (M62.81);Other symptoms and signs involving cognitive function;Other symptoms and signs involving the nervous system (R29.898)   Activity Tolerance Patient tolerated treatment well   Patient Left in bed;with call bell/phone within reach;with bed alarm set;with family/visitor present   Nurse Communication Mobility status        Time: 0865-7846 OT Time Calculation (min): 32 min  Charges: OT General Charges $OT Visit: 1 Visit OT Treatments $Therapeutic Activity: 8-22 mins  Alfonse Flavors, OTA Acute Rehabilitation Services  Office [phone]   Dewain Penning 08/30/2022, 11:09 AM

## 2022-08-30 NOTE — Progress Notes (Signed)
Nutrition Follow-up  DOCUMENTATION CODES:  Severe malnutrition in context of chronic illness  INTERVENTION:  Continue tube feeding via Cortrak tube: Jevity 1.5 at goal rate of 45 ml/hr (1080 ml per day) Prosource TF20 60 ml BID Provides 1780 kcal, 107 gm protein, 820 ml free water daily MVI with minerals daily   NUTRITION DIAGNOSIS:  Severe Malnutrition related to chronic illness (fall) as evidenced by severe fat depletion, severe muscle depletion. - remains applicable  GOAL:  Patient will meet greater than or equal to 90% of their needs - progressing, being met with TF at goal  MONITOR:  Diet advancement, TF tolerance  REASON FOR ASSESSMENT:  Consult Enteral/tube feeding initiation and management  ASSESSMENT:  Pt with PMH of CAD, CVA 2012, DM, HLD, and HTN admitted with traumatic SDH.   5/21 - seizures 5/22 s/p cortrak placement, per xray tip in gastric antrum  5/29 - SLP evaluation, NPO but pt allowed to have sips of honey thick liquids and puree from floor stock  Pt continues with TF via cortrak at goal. Wt stable and tolerating well. Not able to have diet advanced at this time. If pt is medically stable to be discharged to SNF, will likely need placement of long-term feeding tube until swallowing function improves. Will monitor progress and modify to bolus feeds when approaching discharge ready  Nutritionally Relevant Medications: Scheduled Meds:  atorvastatin  80 mg Per Tube Daily   PROSource TF20  60 mL Per Tube BID   insulin aspart  0-15 Units Subcutaneous Q4H   insulin glargine-yfgn  15 Units Subcutaneous Daily   multivitamin with minerals  1 tablet Per Tube Daily   sertraline  25 mg Per Tube QPM   Continuous Infusions:  feeding supplement (JEVITY 1.5 CAL/FIBER) 1,000 mL (08/29/22 2114)   PRN Meds: ondansetron  Labs Reviewed: Na 132 BUN 33 CBG ranges from 153-237 mg/dL over the last 24 hours HgbA1c: 6.0%  NUTRITION - FOCUSED PHYSICAL EXAM: Flowsheet  Row Most Recent Value  Orbital Region Severe depletion  Upper Arm Region Severe depletion  Thoracic and Lumbar Region Severe depletion  Buccal Region Severe depletion  Temple Region Severe depletion  Clavicle Bone Region Severe depletion  Clavicle and Acromion Bone Region Severe depletion  Scapular Bone Region Severe depletion  Dorsal Hand Severe depletion  Patellar Region Severe depletion  Anterior Thigh Region Severe depletion  Posterior Calf Region Severe depletion  Edema (RD Assessment) None  Hair Reviewed  Eyes Unable to assess  Mouth Reviewed  Skin Reviewed  [+ecchymosis]  Nails Reviewed   Diet Order:   Diet Order             Diet NPO time specified  Diet effective now                   EDUCATION NEEDS:  Not appropriate for education at this time  Skin:  Skin Assessment: Reviewed RN Assessment (laceration: face)  Last BM:  5/29  Height:  Ht Readings from Last 1 Encounters:  08/18/22 5\' 7"  (1.702 m)    Weight:  Wt Readings from Last 1 Encounters:  08/31/22 66 kg    BMI:  Body mass index is 22.79 kg/m.  Estimated Nutritional Needs:  Kcal:  1700-1900 Protein:  100-115 grams Fluid:  >1.7 L/day    Greig Castilla, RD, LDN Clinical Dietitian RD pager # available in [facility]  After hours/weekend pager # available in [facility]

## 2022-08-30 NOTE — Inpatient Diabetes Management (Signed)
Inpatient Diabetes Program Recommendations  AACE/ADA: New Consensus Statement on Inpatient Glycemic Control (2015)  Target Ranges:  Prepandial:   less than 140 mg/dL      Peak postprandial:   less than 180 mg/dL (1-2 hours)      Critically ill patients:  140 - 180 mg/dL   Lab Results  Component Value Date   GLUCAP 206 (H) 08/30/2022   HGBA1C 6.0 (H) 08/21/2022    Review of Glycemic Control  Latest Reference Range & Units 08/29/22 07:51 08/29/22 12:45 08/29/22 15:21 08/29/22 19:55 08/29/22 23:08 08/30/22 03:51 08/30/22 08:14  Glucose-Capillary 70 - 99 mg/dL 161 (H) 096 (H) 045 (H) 237 (H) 183 (H) 153 (H) 206 (H)   Diabetes history: DM 2 Outpatient Diabetes medications: metformin 1000 mg bid Current orders for Inpatient glycemic control:  Semglee 15 units Daily Novolog 0-15 units Q4  A1c 6% on 5/20 Jevity 45 ml/hour  Inpatient Diabetes Program Recommendations:    -  Add Novolog 4 units Q4 hours Tube Feed Coverage (hold if Tube feeds are stopped or held)  Thanks,  Christena Deem RN, MSN, BC-ADM Inpatient Diabetes Coordinator Team Pager [phone] (8a-5p)

## 2022-08-30 NOTE — Progress Notes (Signed)
Speech Language Pathology Treatment: Dysphagia  Patient Details Name: Raymond Sampson MRN: [id number] DOB: 1940/11/05 Today's Date: 08/30/2022 Time: 3016-0109 SLP Time Calculation (min) (ACUTE ONLY): 20 min  Assessment / Plan / Recommendation Clinical Impression  Pt seen for dysphagia f/u tx session per nursing request stating pt mentation improved this date and pt asking for food/liquids.  Pt with improved mentation, but waxed/waned during ST session and pt required min verbal cueing during po intake.  Pt with decreased oral preparation/propulsion, oral holding and delay in the initiation of the swallow.  No overt s/sx of aspiration noted throughout trial of honey-thickened liquids and puree consistencies.  Pt given tsp amounts of boluses after oral care completed by spouse.  Pt has Cortrak TF in place for nutrition/hydration purposes.  ST will continue to f/u in acute setting for dysphagia tx/management and potential diet progression as pt able.    HPI HPI: 82 year old male who presented for fall. Hit forehead with laceration repair, no LOC, no facial fx, but CT 5/18 revealed: "stable large, approximately 17 mm thick Right Subdural Hematoma, most pronounced along the anterior right frontal convexity. Stable small volume right hemisphere [facility]." Chest CT 5/17: "Emphysema. Right pleural effusion. Hazy upper lobe airspace disease is indeterminate for edema or pneumonia." Neuro change noted by RN around 1300 on 5/19 with workup revealing seizures, now controlled with medication. Pt with history of CAD, prior stroke, hypertension, hyperlipidemia, type 2 diabetes, chronic antiplatelet use with aspirin and Plavix. MBS revealed hardware from ACDF. Cortrak placed 5/22;ST f/u for dysphagia tx/management      SLP Plan  Continue with current plan of care      Recommendations for follow up therapy are one component of a multi-disciplinary discharge planning process, led by the attending physician.   Recommendations may be updated based on patient status, additional functional criteria and insurance authorization.    Recommendations  Diet recommendations: NPO Medication Administration: Via alternative means                  Oral care QID;Staff/trained caregiver to provide oral care   Frequent or constant Supervision/Assistance Dysphagia, oropharyngeal phase (R13.12)     Continue with current plan of care     Pat Elany Felix,M.S., CCC-SLP  08/30/2022, 1:44 PM

## 2022-08-30 NOTE — Progress Notes (Signed)
Physical Therapy Treatment Patient Details Name: Raymond Sampson MRN: [id number] DOB: 05/08/1940 Today's Date: 08/30/2022   History of Present Illness 82 year old male admitted 08/18/22 after fall at home. CT head 5/18 revealed: large Right Subdural Hematoma, most pronounced along the anterior right frontal convexity.  Developed increased lethargy and change in pupilary size on 5/16 and MRI sowed multiple punctate areas of restricted diffusion in bilateral frontal and parietal cortex suspicious for tiny acute infarcts. LTM EEG positive for nonconvulsive seizures. Pt with history of CAD, prior stroke, hypertension, hyperlipidemia, type 2 diabetes, prior fall in March this year with SNF stay, chronic antiplatelet use with aspirin and Plavix.    PT Comments    Pt was alert upon arrival. Pt is still requiring extensive multi-modal cues and extra time to process cues and sequence mobility with maxAx2 for bed mobility and transfers. He displays weakness in his bil lower extremities and tightness in his bil hamstrings, which is appearing to impact his ability to extend his legs to stand. Thus, stretched bil hamstrings and educated pt's wife on leg exercises throughout the day. He displayed improved sitting balance today, progressing to moments of min guard assist for static sitting at EOB when he did not lean either direction (tends to lean more to R than L and posteriorly more than anteriorly) but modA for dynamic sitting balance. Will continue to follow acutely.      Recommendations for follow up therapy are one component of a multi-disciplinary discharge planning process, led by the attending physician.  Recommendations may be updated based on patient status, additional functional criteria and insurance authorization.  Follow Up Recommendations  Can patient physically be transported by private vehicle: No    Assistance Recommended at Discharge Frequent or constant Supervision/Assistance  Patient can  return home with the following Two people to help with walking and/or transfers;Two people to help with bathing/dressing/bathroom;Assist for transportation;Help with stairs or ramp for entrance;Direct supervision/assist for medications management;Direct supervision/assist for financial management;Assistance with cooking/housework;Assistance with feeding   Equipment Recommendations  Wheelchair (measurements PT);Wheelchair cushion (measurements PT);Hospital bed;Other (comment) (hoyer lift; if goes home instead)    Recommendations for Other Services       Precautions / Restrictions Precautions Precautions: Fall Precaution Comments: Seizures Restrictions Weight Bearing Restrictions: No     Mobility  Bed Mobility Overal bed mobility: Needs Assistance Bed Mobility: Supine to Sit, Sit to Supine     Supine to sit: Max assist, HOB elevated, +2 for physical assistance, +2 for safety/equipment Sit to supine: Max assist, HOB elevated, +2 for physical assistance, +2 for safety/equipment   General bed mobility comments: Pt needing verbal and tactile cues along with physical guidance to bring each leg off R EOB and reach L UE to R side of bed to pull up to sit, maxAx2. MaxAx2 to lean pt on R elbow and swing legs up onto bed for return to supine.    Transfers Overall transfer level: Needs assistance Equipment used: Ambulation equipment used Transfers: Sit to/from Stand Sit to Stand: Max assist, +2 physical assistance, +2 safety/equipment, From elevated surface           General transfer comment: Utilized the stedy to stand from elevated EOB 1x and from stedy flaps 1x. Pt maintained a very flexed posture at kness, hips, and trunk, needing blocking to avoid pt leaning his chest or head on the anterior stedy bar for his safety. Pt pushing through stedy bar at times more than attempting to pull to stand, maxAx2 each  rep.    Ambulation/Gait               General Gait Details:  unable   Stairs             Wheelchair Mobility    Modified Rankin (Stroke Patients Only) Modified Rankin (Stroke Patients Only) Pre-Morbid Rankin Score: Moderate disability Modified Rankin: Severe disability     Balance Overall balance assessment: Needs assistance Sitting-balance support: Single extremity supported, Feet supported, No upper extremity supported, Bilateral upper extremity supported Sitting balance-Leahy Scale: Poor Sitting balance - Comments: Pt with R lateral and posterior lean initially. Propped pt on L elbow and cued pt to reach forward to knees with bil hands once upright again to reduce leaning. Mod success noted, but pt then would begin to flex anteriorly too far and intermittently lean to the L. Min-modA majority of time for static sitting balance with min guard assist intermittently for ~20-40 sec periods, modA consistently for dynamic sitting balance. Postural control: Posterior lean, Right lateral lean Standing balance support: Bilateral upper extremity supported Standing balance-Leahy Scale: Zero Standing balance comment: max A +2 needed to maintain standing and pt maintained trunk, hips, and knees in flexion                            Cognition Arousal/Alertness: Awake/alert Behavior During Therapy: Flat affect (occasional smile) Overall Cognitive Status: Impaired/Different from baseline Area of Impairment: Attention, Memory, Following commands, Safety/judgement, Problem solving, Awareness                   Current Attention Level: Focused Memory: Decreased short-term memory Following Commands: Follows one step commands inconsistently, Follows one step commands with increased time Safety/Judgement: Decreased awareness of safety, Decreased awareness of deficits Awareness: Intellectual Problem Solving: Slow processing, Decreased initiation, Difficulty sequencing, Requires verbal cues, Requires tactile cues General Comments: Pt  looking forward majority of time. Poor initiation and awareness/problem-solving to correct his sitting balance to avoid leaning on stedy bar or falling laterally or posteriorly, needing extensive multi-modal cues to correct. Follows ~40% of simple cues with extra processing time.        Exercises Other Exercises Other Exercises: static and dynamic sitting EOB >10 min Other Exercises: hamstring stretch bil 1x ~30 sec bil supine in bed    General Comments        Pertinent Vitals/Pain Pain Assessment Pain Assessment: Faces Faces Pain Scale: Hurts little more Pain Location: L hip Pain Descriptors / Indicators: Discomfort, Grimacing, Guarding, Moaning Pain Intervention(s): Limited activity within patient's tolerance, Monitored during session, Repositioned    Home Living                          Prior Function            PT Goals (current goals can now be found in the care plan section) Acute Rehab PT Goals Patient Stated Goal: to go home PT Goal Formulation: With patient/family Time For Goal Achievement: 09/05/22 Potential to Achieve Goals: Fair Progress towards PT goals: Progressing toward goals (slowly)    Frequency    Min 3X/week      PT Plan Current plan remains appropriate    Co-evaluation PT/OT/SLP Co-Evaluation/Treatment: Yes Reason for Co-Treatment: Necessary to address cognition/behavior during functional activity;To address functional/ADL transfers;For patient/therapist safety PT goals addressed during session: Mobility/safety with mobility;Balance;Strengthening/ROM;Proper use of DME        AM-PAC PT "6 Clicks"  Mobility   Outcome Measure  Help needed turning from your back to your side while in a flat bed without using bedrails?: A Lot Help needed moving from lying on your back to sitting on the side of a flat bed without using bedrails?: Total Help needed moving to and from a bed to a chair (including a wheelchair)?: Total Help needed  standing up from a chair using your arms (e.g., wheelchair or bedside chair)?: Total Help needed to walk in hospital room?: Total Help needed climbing 3-5 steps with a railing? : Total 6 Click Score: 7    End of Session Equipment Utilized During Treatment: Gait belt Activity Tolerance: Patient tolerated treatment well Patient left: in bed;with call bell/phone within reach;with bed alarm set;with family/visitor present   PT Visit Diagnosis: Other symptoms and signs involving the nervous system (R29.898);Muscle weakness (generalized) (M62.81);Other abnormalities of gait and mobility (R26.89);Unsteadiness on feet (R26.81);Difficulty in walking, not elsewhere classified (R26.2)     Time: 4098-1191 PT Time Calculation (min) (ACUTE ONLY): 32 min  Charges:  $Therapeutic Activity: 8-22 mins                     Raymond Gurney, PT, DPT Acute Rehabilitation Services  Office: [phone]    Raymond Sampson 08/30/2022, 9:17 AM

## 2022-08-30 NOTE — Progress Notes (Signed)
[facility]                                                                               Raymond Sampson, is a 82 y.o. male, DOB - 10/01/1940, NGE:[id number] Admit date - 08/18/2022    Outpatient Primary MD for the patient is Gracelyn Nurse, MD  LOS - 12  days    Brief summary   82 year old M with PMH of CAD, CVA, HTN, HLD, DM-2, cervical spine fusion and OA presented to ED after fall at home and found to have traumatic right frontal SDH measuring 2.2 cm in maximum thickness with 5 to 6 mm midline shift to the left, and trace SAH at the right temporal lobe.  Patient was on Plavix and aspirin.  CT cervical spine without acute finding but some sclerotic changes at C2-C3 facet joint.  He was admitted to ICU by neurosurgery. He was initially minimally symptomatic and refused surgery.  However, he developed intermittent focal seizure.    Repeat CT head on 5/18 with stable SDH and SAH.  MRI brain showed multiple small/punctate areas of restricted diffusion with bilateral frontal and parietal cortex suspicious for tiny acute infarcts or small intraparenchymal contusion and/or shear type injury in the setting of trauma.  EEG on 5/21 showed intermittent seizure.  Neurology consulted.  Patient was started on Keppra and Vimpat.  Palliative medicine consulted as well.   Patient was transferred to hospitalist service on 5/23.  Subsequent LTM EEG without seizure.  Neurology signed off.  Encephalopathy improved but seems to be delirious.  Remains NPO except some pured diet from nursing stock.  He is on tube feed via cortrack.  SLP following.  Therapy recommended SNF   Assessment & Plan    Assessment and Plan:   Fall at home, left frontal head laceration S/p repair with stitches. Sutures were removed on 08/27/2022    Traumatic right subdural hematoma/subarachnoid hemorrhage with midline shift due to fall and antiplatelet therapy Repeat CT shows stable hematoma Neurosurgery  recommended no surgical intervention at this time. Aspirin Plavix on hold for 1 week and then resume. Neurosurgery recommended outpatient follow-up. Therapy evaluations recommending SNF    Provoked seizures probably secondary to right subdural hematoma and subarachnoid hemorrhage with midline shift EEG showed seizures on 09/01/2022 Subsequent LTM EEG with evidence of epileptogenicity arising from the right central parietal region.  As Patient is currently on Keppra 1000 mg twice daily and Vimpat 100 mg twice daily and clonazepam 2 mg as needed for seizures.   Acute encephalopathy probably secondary to the subdural hematoma and provoked seizures in the setting of memory deficits Reorientation and delirium precautions Avoid/minimize sedating medications   Pain back pain with some bruising seen due to fall X-rays are negative for any acute findings Pain control, Lidoderm patch and therapy evaluations.    Acute blood loss anemia no obvious signs of bleeding no significant iron deficiency on anemia panel hemoglobin improved after 2 units of PRBC transfusion. Continue to monitor as needed     History of coronary artery disease s/p PCI/DES stent to LCx/OM 2 in January 2024 Continue with Plavix, low-dose aspirin and Coreg    Non-insulin-dependent  diabetes mellitus with hyperglycemia  CBGs are better controlled Continue with sliding scale insulin    Hypertension Blood pressure parameters appear to be optimal    History of CVA Continue with aspirin Plavix and Lipitor    Hypokalemia and hypophosphatemia Replaced   Hyponatremia Continue to monitor.    Dysphagia Patient on honey thickened pure from nursing. Speech therapy evaluations recommending tube feeds for nutritional supplementation  Mood disorder Continue with home Zoloft.     severe malnutrition Dietary on board  recommendations are to continue with supplementation at this time   Malnutrition  Type:  Nutrition Problem: Severe Malnutrition Etiology: chronic illness (fall)   Malnutrition Characteristics:  Signs/Symptoms: severe fat depletion, severe muscle depletion   Nutrition Interventions:  Interventions: Tube feeding, Prostat, MVI  Estimated body mass index is 23.07 kg/m as calculated from the following:   Height as of this encounter: 5\' 7"  (1.702 m).   Weight as of this encounter: 66.8 kg.  Code Status: DNR DVT Prophylaxis:  enoxaparin (LOVENOX) injection 40 mg Start: 08/28/22 1000 Place and maintain sequential compression device Start: 08/25/22 0839 SCDs Start: 08/18/22 2106   Level of Care: Level of care: Med-Surg Family Communication: Updated patient's family at bedside  Disposition Plan:     Remains inpatient appropriate: Waiting for SNF  Procedures:    Consultants:   PCCM.   Antimicrobials:   Anti-infectives (From admission, onward)    None        Medications  Scheduled Meds:  amLODipine  10 mg Per Tube Daily   aspirin  81 mg Per Tube Daily   atorvastatin  80 mg Per Tube Daily   carvedilol  12.5 mg Per Tube BID WC   clopidogrel  75 mg Per Tube Daily   enoxaparin (LOVENOX) injection  40 mg Subcutaneous Q24H   feeding supplement (PROSource TF20)  60 mL Per Tube BID   insulin aspart  0-15 Units Subcutaneous Q4H   insulin glargine-yfgn  15 Units Subcutaneous Daily   lidocaine  1 patch Transdermal Q24H   multivitamin with minerals  1 tablet Per Tube Daily   mouth rinse  15 mL Mouth Rinse 4 times per day   sertraline  25 mg Per Tube QPM   Continuous Infusions:  sodium chloride Stopped (08/28/22 1547)   feeding supplement (JEVITY 1.5 CAL/FIBER) 1,000 mL (08/29/22 2114)   lacosamide (VIMPAT) IV 100 mg (08/30/22 1023)   levETIRAcetam 1,000 mg (08/30/22 0846)   PRN Meds:.sodium chloride, acetaminophen (TYLENOL) oral liquid 160 mg/5 mL, [DISCONTINUED] acetaminophen **OR** acetaminophen, clonazepam, labetalol, [DISCONTINUED] ondansetron  **OR** ondansetron (ZOFRAN) IV, mouth rinse, mouth rinse    Subjective:   Raymond Sampson was seen and examined today.  He worked with PT, opening eyes to verbal commands.   Objective:   Vitals:   08/30/22 0348 08/30/22 0500 08/30/22 0719 08/30/22 1202  BP: (!) 161/69  (!) 153/79 135/67  Pulse: 81  82 83  Resp: 17  16 18   Temp: 98 F (36.7 C)  98.1 F (36.7 C) 98 F (36.7 C)  TempSrc: Oral  Oral Oral  SpO2: 98%  95% 97%  Weight:  66.8 kg    Height:        Intake/Output Summary (Last 24 hours) at 08/30/2022 1229 Last data filed at 08/30/2022 0700 Gross per 24 hour  Intake 1585 ml  Output 2550 ml  Net -965 ml   Filed Weights   08/28/22 0424 08/29/22 0434 08/30/22 0500  Weight: 67.7 kg 66.8 kg 66.8 kg  Exam General exam: Appears calm and comfortable  Respiratory system: Clear to auscultation. Respiratory effort normal. Cardiovascular system: S1 & S2 heard, RRR. No JVD, Gastrointestinal system: Abdomen is nondistended, soft and nontender.  Central nervous system: Alert and oriented to person on ly.  Extremities: Symmetric 5 x 5 power. Skin: No rashes, lesions or ulcers Psychiatry: calm.    Data Reviewed:  I have personally reviewed following labs and imaging studies   CBC Lab Results  Component Value Date   WBC 11.6 (H) 08/30/2022   RBC 3.54 (L) 08/30/2022   HGB 10.4 (L) 08/30/2022   HCT 32.5 (L) 08/30/2022   MCV 91.8 08/30/2022   MCH 29.4 08/30/2022   PLT 396 08/30/2022   MCHC 32.0 08/30/2022   RDW 15.5 08/30/2022   LYMPHSABS 1.4 08/18/2022   MONOABS 0.7 08/18/2022   EOSABS 0.3 08/18/2022   BASOSABS 0.0 08/18/2022     Last metabolic panel Lab Results  Component Value Date   NA 132 (L) 08/30/2022   K 4.2 08/30/2022   CL 98 08/30/2022   CO2 26 08/30/2022   BUN 33 (H) 08/30/2022   CREATININE 0.80 08/30/2022   GLUCOSE 195 (H) 08/30/2022   GFRNONAA >60 08/30/2022   GFRAA >60 06/06/2017   CALCIUM 8.7 (L) 08/30/2022   PHOS 3.0 08/30/2022    PROT 6.4 (L) 08/21/2022   ALBUMIN 2.3 (L) 08/30/2022   BILITOT 0.8 08/21/2022   ALKPHOS 93 08/21/2022   AST 19 08/21/2022   ALT 18 08/21/2022   ANIONGAP 8 08/30/2022    CBG (last 3)  Recent Labs    08/30/22 0351 08/30/22 0814 08/30/22 1203  GLUCAP 153* 206* 187*      Coagulation Profile: No results for input(s): "INR", "PROTIME" in the last 168 hours.   Radiology Studies: No results found.     Kathlen Mody M.D. [facility] 08/30/2022, 12:29 PM  Available via Epic secure chat 7am-7pm After 7 pm, please refer to night coverage provider listed on amion.

## 2022-08-31 DIAGNOSIS — E43 Unspecified severe protein-calorie malnutrition: Secondary | ICD-10-CM | POA: Diagnosis not present

## 2022-08-31 DIAGNOSIS — R569 Unspecified convulsions: Secondary | ICD-10-CM | POA: Diagnosis not present

## 2022-08-31 DIAGNOSIS — G9340 Encephalopathy, unspecified: Secondary | ICD-10-CM | POA: Diagnosis not present

## 2022-08-31 DIAGNOSIS — S065X0D Traumatic subdural hemorrhage without loss of consciousness, subsequent encounter: Secondary | ICD-10-CM | POA: Diagnosis not present

## 2022-08-31 LAB — GLUCOSE, CAPILLARY
Glucose-Capillary: 151 mg/dL — ABNORMAL HIGH (ref 70–99)
Glucose-Capillary: 184 mg/dL — ABNORMAL HIGH (ref 70–99)
Glucose-Capillary: 158 mg/dL — ABNORMAL HIGH (ref 70–99)
Glucose-Capillary: 146 mg/dL — ABNORMAL HIGH (ref 70–99)
Glucose-Capillary: 167 mg/dL — ABNORMAL HIGH (ref 70–99)
Glucose-Capillary: 131 mg/dL — ABNORMAL HIGH (ref 70–99)

## 2022-08-31 LAB — CBC WITH DIFFERENTIAL/PLATELET
Abs Immature Granulocytes: 0.06 10*3/uL (ref 0.00–0.07)
Basophils Absolute: 0 10*3/uL (ref 0.0–0.1)
Basophils Relative: 0 %
Eosinophils Absolute: 0.5 10*3/uL (ref 0.0–0.5)
Eosinophils Relative: 4 %
HCT: 33.5 % — ABNORMAL LOW (ref 39.0–52.0)
Hemoglobin: 10.9 g/dL — ABNORMAL LOW (ref 13.0–17.0)
Immature Granulocytes: 1 %
Lymphocytes Relative: 15 %
Lymphs Abs: 1.7 10*3/uL (ref 0.7–4.0)
MCH: 30.7 pg (ref 26.0–34.0)
MCHC: 32.5 g/dL (ref 30.0–36.0)
MCV: 94.4 fL (ref 80.0–100.0)
Monocytes Absolute: 0.9 10*3/uL (ref 0.1–1.0)
Monocytes Relative: 8 %
Neutro Abs: 8.3 10*3/uL — ABNORMAL HIGH (ref 1.7–7.7)
Neutrophils Relative %: 72 %
Platelets: 408 10*3/uL — ABNORMAL HIGH (ref 150–400)
RBC: 3.55 MIL/uL — ABNORMAL LOW (ref 4.22–5.81)
RDW: 15.5 % (ref 11.5–15.5)
WBC: 11.6 10*3/uL — ABNORMAL HIGH (ref 4.0–10.5)
nRBC: 0 % (ref 0.0–0.2)

## 2022-08-31 LAB — BASIC METABOLIC PANEL
Anion gap: 10 (ref 5–15)
BUN: 40 mg/dL — ABNORMAL HIGH (ref 8–23)
CO2: 24 mmol/L (ref 22–32)
Calcium: 8.6 mg/dL — ABNORMAL LOW (ref 8.9–10.3)
Chloride: 98 mmol/L (ref 98–111)
Creatinine, Ser: 0.8 mg/dL (ref 0.61–1.24)
GFR, Estimated: >60 mL/min (ref >60)
Glucose, Bld: 149 mg/dL — ABNORMAL HIGH (ref 70–99)
Potassium: 4.7 mmol/L (ref 3.5–5.1)
Sodium: 132 mmol/L — ABNORMAL LOW (ref 135–145)

## 2022-08-31 LAB — T4, FREE: Free T4: 0.87 ng/dL (ref 0.61–1.12)

## 2022-08-31 MED ORDER — INSULIN ASPART 100 UNIT/ML IJ SOLN
3.0000 [IU] | INTRAMUSCULAR | Status: DC
  Administered 2022-08-31 – 2022-09-15 (×85): 3 [IU] via SUBCUTANEOUS

## 2022-08-31 NOTE — Plan of Care (Signed)
  Problem: Clinical Measurements: Goal: Will remain free from infection Outcome: Progressing   Problem: Activity: Goal: Risk for activity intolerance will decrease Outcome: Progressing   Problem: Pain Managment: Goal: General experience of comfort will improve Outcome: Progressing   Problem: Safety: Goal: Ability to remain free from injury will improve Outcome: Progressing   

## 2022-08-31 NOTE — Progress Notes (Signed)
PT Cancellation Note  Patient Details Name: LEBURN UNIS MRN: [id number] DOB: 07/28/1940   Cancelled Treatment:    Reason Eval/Treat Not Completed: Fatigue/lethargy limiting ability to participate. Pt very lethargic. Family reports he sat EOB this AM with nursing and brushed his teeth. Currently awaiting NT for bath due to soiled with BM. Family requesting PT re-attempt tomorrow.   Ilda Foil 08/31/2022, 10:50 AM

## 2022-08-31 NOTE — Progress Notes (Signed)
[facility]                                                                               Raymond Sampson, is a 82 y.o. male, DOB - 1940/05/11, WUJ:[id number] Admit date - 08/18/2022    Outpatient Primary MD for the patient is Gracelyn Nurse, MD  LOS - 13  days    Brief summary   82 year old M with PMH of CAD, CVA, HTN, HLD, DM-2, cervical spine fusion and OA presented to ED after fall at home and found to have traumatic right frontal SDH measuring 2.2 cm in maximum thickness with 5 to 6 mm midline shift to the left, and trace SAH at the right temporal lobe.  Patient was on Plavix and aspirin.  CT cervical spine without acute finding but some sclerotic changes at C2-C3 facet joint.  He was admitted to ICU by neurosurgery. He was initially minimally symptomatic and refused surgery.  However, he developed intermittent focal seizure.    Repeat CT head on 5/18 with stable SDH and SAH.  MRI brain showed multiple small/punctate areas of restricted diffusion with bilateral frontal and parietal cortex suspicious for tiny acute infarcts or small intraparenchymal contusion and/or shear type injury in the setting of trauma.  EEG on 5/21 showed intermittent seizure.  Neurology consulted.  Patient was started on Keppra and Vimpat.  Palliative medicine consulted as well.   Patient was transferred to hospitalist service on 5/23.  Subsequent LTM EEG without seizure.  Neurology signed off.  Encephalopathy improved but seems to be delirious.  Remains NPO except some pured diet from nursing stock.  He is on tube feed via cortrack.  SLP following.  Therapy recommended SNF. Patient remains very sleepy , SLP unable to assess him due to sleepiness.    Assessment & Plan    Assessment and Plan:   Fall at home, left frontal head laceration S/p repair with stitches. Sutures were removed on 08/27/2022    Traumatic right subdural hematoma/subarachnoid hemorrhage with midline shift due to fall and  antiplatelet therapy Repeat CT shows stable hematoma Neurosurgery recommended no surgical intervention at this time. Aspirin Plavix on hold for 1 week and then resume. Neurosurgery recommended outpatient follow-up. Therapy evaluations recommending SNF    Provoked seizures probably secondary to right subdural hematoma and subarachnoid hemorrhage with midline shift EEG showed seizures on 09/01/2022 Subsequent LTM EEG with evidence of epileptogenicity arising from the right central parietal region.   Patient is currently on Keppra 1000 mg twice daily and Vimpat 100 mg twice daily and clonazepam 2 mg as needed for seizures.   Acute encephalopathy probably secondary to the subdural hematoma and provoked seizures in the setting of memory deficits Reorientation and delirium precautions Avoid/minimize sedating medications.  Patient remains sleepy , with intermittently being awake and eating some floor stock and pudding.  Working with therapy.  Recommend getting TSH, cbc and bmp and ammonia levels. If neg will repeat CT head in am for further evaluation.    Pain back pain with some bruising seen due to fall X-rays are negative for any acute findings Pain control, Lidoderm patch and therapy evaluations.    Acute blood  loss anemia no obvious signs of bleeding no significant iron deficiency on anemia panel hemoglobin improved after 2 units of PRBC transfusion. Continue to monitor as needed. Check cbc today.      History of coronary artery disease s/p PCI/DES stent to LCx/OM 2 in January 2024 Continue with Plavix, low-dose aspirin and Coreg    Non-insulin-dependent diabetes mellitus with hyperglycemia  CBGs are better controlled Continue with sliding scale insulin CBG (last 3)  Recent Labs    08/31/22 0409 08/31/22 0859 08/31/22 1151  GLUCAP 151* 184* 158*   No changes in meds.     Hypertension Blood pressure parameters well controlled.     History of CVA Continue  with aspirin Plavix and Lipitor    Hypokalemia and hypophosphatemia Replaced   Hyponatremia Continue to monitor. Check BMP today.     Dysphagia Patient on honey thickened pure from nursing. Speech therapy evaluations recommending tube feeds for nutritional supplementation  Mood disorder Continue with home Zoloft.     severe malnutrition Dietary on board  recommendations are to continue with supplementation at this time   Malnutrition Type:  Nutrition Problem: Severe Malnutrition Etiology: chronic illness (fall)   Malnutrition Characteristics:  Signs/Symptoms: severe fat depletion, severe muscle depletion   Nutrition Interventions:  Interventions: Tube feeding, Prostat, MVI  Estimated body mass index is 22.79 kg/m as calculated from the following:   Height as of this encounter: 5\' 7"  (1.702 m).   Weight as of this encounter: 66 kg.  Code Status: DNR DVT Prophylaxis:  enoxaparin (LOVENOX) injection 40 mg Start: 08/28/22 1000 Place and maintain sequential compression device Start: 08/25/22 0839 SCDs Start: 08/18/22 2106   Level of Care: Level of care: Med-Surg Family Communication: Updated patient's family at bedside  Disposition Plan:     Remains inpatient appropriate: Waiting for SNF  Procedures:    Consultants:   PCCM.   Antimicrobials:   Anti-infectives (From admission, onward)    None        Medications  Scheduled Meds:  amLODipine  10 mg Per Tube Daily   aspirin  81 mg Per Tube Daily   atorvastatin  80 mg Per Tube Daily   carvedilol  12.5 mg Per Tube BID WC   clopidogrel  75 mg Per Tube Daily   enoxaparin (LOVENOX) injection  40 mg Subcutaneous Q24H   feeding supplement (PROSource TF20)  60 mL Per Tube BID   insulin aspart  0-15 Units Subcutaneous Q4H   insulin aspart  3 Units Subcutaneous Q4H   insulin glargine-yfgn  15 Units Subcutaneous Daily   lidocaine  1 patch Transdermal Q24H   multivitamin with minerals  1 tablet Per  Tube Daily   mouth rinse  15 mL Mouth Rinse 4 times per day   sertraline  25 mg Per Tube QPM   Continuous Infusions:  sodium chloride Stopped (08/28/22 1547)   feeding supplement (JEVITY 1.5 CAL/FIBER) 1,000 mL (08/29/22 2114)   lacosamide (VIMPAT) IV 100 mg (08/31/22 1015)   levETIRAcetam 1,000 mg (08/31/22 0902)   PRN Meds:.sodium chloride, acetaminophen (TYLENOL) oral liquid 160 mg/5 mL, [DISCONTINUED] acetaminophen **OR** acetaminophen, clonazepam, labetalol, [DISCONTINUED] ondansetron **OR** ondansetron (ZOFRAN) IV, mouth rinse, mouth rinse    Subjective:   Makeen Greenough was seen and examined today.  Sleepy today,   Objective:   Vitals:   08/31/22 0500 08/31/22 0727 08/31/22 0900 08/31/22 1151  BP:  (!) 153/73  116/64  Pulse:  65 70 79  Resp:  18  18  Temp:  98.6 F (37 C)  98.5 F (36.9 C)  TempSrc:  Oral  Oral  SpO2:  100%  95%  Weight: 66 kg     Height:        Intake/Output Summary (Last 24 hours) at 08/31/2022 1448 Last data filed at 08/31/2022 0505 Gross per 24 hour  Intake 300 ml  Output 600 ml  Net -300 ml    Filed Weights   08/29/22 0434 08/30/22 0500 08/31/22 0500  Weight: 66.8 kg 66.8 kg 66 kg     Exam General exam: Appears calm and comfortable  Respiratory system: Clear to auscultation. Respiratory effort normal. Cardiovascular system: S1 & S2 heard, RRR.  Gastrointestinal system: Abdomen is nondistended, soft and nontender.  Central nervous system: lethargic, opening eyes to verbal cues. Able to stand up  Extremities: no pedal edema.  Skin: No rashes,  Psychiatry: unable to assess.    Data Reviewed:  I have personally reviewed following labs and imaging studies   CBC Lab Results  Component Value Date   WBC 11.6 (H) 08/30/2022   RBC 3.54 (L) 08/30/2022   HGB 10.4 (L) 08/30/2022   HCT 32.5 (L) 08/30/2022   MCV 91.8 08/30/2022   MCH 29.4 08/30/2022   PLT 396 08/30/2022   MCHC 32.0 08/30/2022   RDW 15.5 08/30/2022   LYMPHSABS 1.4  08/18/2022   MONOABS 0.7 08/18/2022   EOSABS 0.3 08/18/2022   BASOSABS 0.0 08/18/2022     Last metabolic panel Lab Results  Component Value Date   NA 132 (L) 08/30/2022   K 4.2 08/30/2022   CL 98 08/30/2022   CO2 26 08/30/2022   BUN 33 (H) 08/30/2022   CREATININE 0.80 08/30/2022   GLUCOSE 195 (H) 08/30/2022   GFRNONAA >60 08/30/2022   GFRAA >60 06/06/2017   CALCIUM 8.7 (L) 08/30/2022   PHOS 3.0 08/30/2022   PROT 6.4 (L) 08/21/2022   ALBUMIN 2.3 (L) 08/30/2022   BILITOT 0.8 08/21/2022   ALKPHOS 93 08/21/2022   AST 19 08/21/2022   ALT 18 08/21/2022   ANIONGAP 8 08/30/2022    CBG (last 3)  Recent Labs    08/31/22 0409 08/31/22 0859 08/31/22 1151  GLUCAP 151* 184* 158*       Coagulation Profile: No results for input(s): "INR", "PROTIME" in the last 168 hours.   Radiology Studies: No results found.     Kathlen Mody M.D. [facility] 08/31/2022, 2:48 PM  Available via Epic secure chat 7am-7pm After 7 pm, please refer to night coverage provider listed on amion.

## 2022-08-31 NOTE — Progress Notes (Signed)
SLP Note  Patient Details Name: Raymond Sampson MRN: [id number] DOB: 03-22-1941   Cancelled treatment:        Pt drowsy at this time.  Spoke with RN and wife.  He is having increasing periods of alertness, but we haven't been able to catch him for swallowing at those times.  SLP will continue to follow.  RN will reach out to ST if pt is alert/requesting foods. Will reach out to MD/care team regarding plan of care and timing of further swallowing assessment.   Kerrie Pleasure, MA, CCC-SLP Acute Rehabilitation Services Office: [phone] 08/31/2022, 12:23 PM

## 2022-09-01 ENCOUNTER — Inpatient Hospital Stay (HOSPITAL_COMMUNITY): Payer: PPO

## 2022-09-01 DIAGNOSIS — S065X0D Traumatic subdural hemorrhage without loss of consciousness, subsequent encounter: Secondary | ICD-10-CM | POA: Diagnosis not present

## 2022-09-01 DIAGNOSIS — R569 Unspecified convulsions: Secondary | ICD-10-CM | POA: Diagnosis not present

## 2022-09-01 DIAGNOSIS — E43 Unspecified severe protein-calorie malnutrition: Secondary | ICD-10-CM | POA: Diagnosis not present

## 2022-09-01 DIAGNOSIS — G9340 Encephalopathy, unspecified: Secondary | ICD-10-CM | POA: Diagnosis not present

## 2022-09-01 LAB — GLUCOSE, CAPILLARY
Glucose-Capillary: 106 mg/dL — ABNORMAL HIGH (ref 70–99)
Glucose-Capillary: 148 mg/dL — ABNORMAL HIGH (ref 70–99)
Glucose-Capillary: 151 mg/dL — ABNORMAL HIGH (ref 70–99)
Glucose-Capillary: 168 mg/dL — ABNORMAL HIGH (ref 70–99)
Glucose-Capillary: 181 mg/dL — ABNORMAL HIGH (ref 70–99)
Glucose-Capillary: 197 mg/dL — ABNORMAL HIGH (ref 70–99)

## 2022-09-01 LAB — AMMONIA: Ammonia: 29 umol/L (ref 9–35)

## 2022-09-01 LAB — TSH: TSH: 0.781 u[IU]/mL (ref 0.350–4.500)

## 2022-09-01 MED ORDER — GERHARDT'S BUTT CREAM
TOPICAL_CREAM | Freq: Four times a day (QID) | CUTANEOUS | Status: DC
  Administered 2022-09-11 – 2022-09-17 (×3): 1 via TOPICAL
  Filled 2022-09-01 (×2): qty 1

## 2022-09-01 MED ORDER — LEVETIRACETAM 100 MG/ML PO SOLN
750.0000 mg | Freq: Two times a day (BID) | ORAL | Status: DC
  Administered 2022-09-01: 750 mg
  Filled 2022-09-01 (×2): qty 10

## 2022-09-01 NOTE — Progress Notes (Signed)
Physical Therapy Treatment Patient Details Name: Raymond Sampson MRN: [id number] DOB: December 08, 1940 Today's Date: 09/01/2022   History of Present Illness 82 year old male admitted 08/18/22 after fall at home. CT head 5/18 revealed: large Right Subdural Hematoma, most pronounced along the anterior right frontal convexity.  Developed increased lethargy and change in pupilary size on 5/16 and MRI sowed multiple punctate areas of restricted diffusion in bilateral frontal and parietal cortex suspicious for tiny acute infarcts. LTM EEG positive for nonconvulsive seizures. Pt with history of CAD, prior stroke, hypertension, hyperlipidemia, type 2 diabetes, prior fall in March this year with SNF stay, chronic antiplatelet use with aspirin and Plavix.    PT Comments    Pt limited by lethargy this date and reportedly has been more lethargic over the past x2 days per family. He required maxA for bed mobility and to stand and pivot along EOB with bil knees blocked and HHA today. He demonstrated limited attention and activation due to his lethargy though. Assisted pt in sitting EOB for >10 min to promote pt's level of arousal. Will continue to follow acutely.     Recommendations for follow up therapy are one component of a multi-disciplinary discharge planning process, led by the attending physician.  Recommendations may be updated based on patient status, additional functional criteria and insurance authorization.  Follow Up Recommendations  Can patient physically be transported by private vehicle: No    Assistance Recommended at Discharge Frequent or constant Supervision/Assistance  Patient can return home with the following Two people to help with walking and/or transfers;Two people to help with bathing/dressing/bathroom;Assist for transportation;Help with stairs or ramp for entrance;Direct supervision/assist for medications management;Direct supervision/assist for financial management;Assistance with  cooking/housework;Assistance with feeding   Equipment Recommendations  Wheelchair (measurements PT);Wheelchair cushion (measurements PT);Hospital bed;Other (comment) (air mattress; hoyer lift; if goes home instead)    Recommendations for Other Services       Precautions / Restrictions Precautions Precautions: Fall Precaution Comments: Seizures; mittens; cortrak Restrictions Weight Bearing Restrictions: No     Mobility  Bed Mobility Overal bed mobility: Needs Assistance Bed Mobility: Supine to Sit, Sit to Supine     Supine to sit: Max assist, HOB elevated Sit to supine: Max assist, HOB elevated   General bed mobility comments: Pt needing verbal and tactile cues along with physical guidance to bring each leg off L EOB and bil HHA to cue pt to pull to ascend trunk to sit up L EOB, maxA. MaxA to lean pt on L elbow and swing legs up onto bed for return to supine.    Transfers Overall transfer level: Needs assistance Equipment used: 1 person hand held assist Transfers: Sit to/from Stand Sit to Stand: Max assist, From elevated surface           General transfer comment: Bil knees blocked and pt cued to hold onto therapist to pull to stand from elevated EOB and pivot hips towards HOB, maxA.    Ambulation/Gait               General Gait Details: unable   Stairs             Wheelchair Mobility    Modified Rankin (Stroke Patients Only) Modified Rankin (Stroke Patients Only) Pre-Morbid Rankin Score: Moderate disability Modified Rankin: Severe disability     Balance Overall balance assessment: Needs assistance Sitting-balance support: Single extremity supported, Feet supported, No upper extremity supported, Bilateral upper extremity supported Sitting balance-Leahy Scale: Poor Sitting balance - Comments: Pt with R  lateral and posterior lean initially, needing mod-maxA. As time in sitting progressed along with cues to correct his lean, he progressed to minA  with intermittent min guard bouts Postural control: Posterior lean, Right lateral lean Standing balance support: Bilateral upper extremity supported Standing balance-Leahy Scale: Zero Standing balance comment: max A needed to maintain standing and pt maintained trunk, hips, and knees in flexion                            Cognition Arousal/Alertness: Lethargic Behavior During Therapy: Flat affect Overall Cognitive Status: Impaired/Different from baseline Area of Impairment: Attention, Memory, Following commands, Safety/judgement, Problem solving, Awareness                   Current Attention Level: Focused Memory: Decreased short-term memory Following Commands: Follows one step commands inconsistently, Follows one step commands with increased time Safety/Judgement: Decreased awareness of safety, Decreased awareness of deficits Awareness: Intellectual Problem Solving: Slow processing, Decreased initiation, Difficulty sequencing, Requires verbal cues, Requires tactile cues General Comments: Pt very lethargic today, opening eyes for brief periods of time and answering questions with delay, often needing to stimulation to regain his attention. Poor command following and initiation today.        Exercises General Exercises - Lower Extremity Long Arc Quad:  (attempted but pt not following cues to perform)    General Comments General comments (skin integrity, edema, etc.): noted redness and wounds at buttocks, notified RN and requested air mattress for pt      Pertinent Vitals/Pain Pain Assessment Pain Assessment: Faces Faces Pain Scale: Hurts little more Pain Location: L hip Pain Descriptors / Indicators: Discomfort, Grimacing, Guarding, Moaning Pain Intervention(s): Monitored during session, Limited activity within patient's tolerance, Repositioned    Home Living                          Prior Function            PT Goals (current goals can now be  found in the care plan section) Acute Rehab PT Goals Patient Stated Goal: to go home PT Goal Formulation: With patient/family Time For Goal Achievement: 09/05/22 Potential to Achieve Goals: Fair Progress towards PT goals: Not progressing toward goals - comment (limited by lethargy)    Frequency    Min 3X/week      PT Plan Current plan remains appropriate    Co-evaluation              AM-PAC PT "6 Clicks" Mobility   Outcome Measure  Help needed turning from your back to your side while in a flat bed without using bedrails?: A Lot Help needed moving from lying on your back to sitting on the side of a flat bed without using bedrails?: A Lot Help needed moving to and from a bed to a chair (including a wheelchair)?: Total Help needed standing up from a chair using your arms (e.g., wheelchair or bedside chair)?: A Lot Help needed to walk in hospital room?: Total Help needed climbing 3-5 steps with a railing? : Total 6 Click Score: 9    End of Session Equipment Utilized During Treatment: Gait belt Activity Tolerance: Patient limited by lethargy Patient left: in bed;with call bell/phone within reach;with bed alarm set;with family/visitor present;with restraints reapplied;with SCD's reapplied Nurse Communication: Mobility status;Other (comment) (needs air mattress with wounds noted at buttocks) PT Visit Diagnosis: Other symptoms and signs involving the nervous  system (R29.898);Muscle weakness (generalized) (M62.81);Other abnormalities of gait and mobility (R26.89);Unsteadiness on feet (R26.81);Difficulty in walking, not elsewhere classified (R26.2)     Time: 0865-7846 PT Time Calculation (min) (ACUTE ONLY): 32 min  Charges:  $Therapeutic Activity: 23-37 mins                     Raymond Gurney, PT, DPT Acute Rehabilitation Services  Office: [phone]    Jewel Baize 09/01/2022, 5:03 PM

## 2022-09-01 NOTE — Progress Notes (Signed)
SLP Cancellation Note  Patient Details Name: Raymond Sampson MRN: [id number] DOB: 10-06-1940   Cancelled treatment:        Attempted to see pt for ongoing dysphagia management.  Pt asleep.  Wife [name] in room.  Discussed treatment plan.  She does not believe pt would want to have PEG.  [name] shared stories about [place] and their family.  We will plan for repeat MBSS Monday, and will try to optimize timing for when pt is alert.   Kerrie Pleasure, MA, CCC-SLP Acute Rehabilitation Services Office: [phone] 09/01/2022, 9:57 AM

## 2022-09-01 NOTE — Plan of Care (Signed)
  Problem: Clinical Measurements: Goal: Will remain free from infection Outcome: Progressing Goal: Respiratory complications will improve Outcome: Progressing Goal: Cardiovascular complication will be avoided Outcome: Progressing   Problem: Activity: Goal: Risk for activity intolerance will decrease Outcome: Progressing   Problem: Nutrition: Goal: Adequate nutrition will be maintained Outcome: Progressing   

## 2022-09-01 NOTE — Progress Notes (Signed)
LTM EEG hooked up and running - no initial skin breakdown - push button tested - [facility] monitoring.  

## 2022-09-01 NOTE — Progress Notes (Signed)
[facility]                                                                               Raymond Sampson, is a 82 y.o. male, DOB - 1941/01/17, XBJ:[id number] Admit date - 08/18/2022    Outpatient Primary MD for the patient is Gracelyn Nurse, MD  LOS - 14  days    Brief summary   82 year old M with PMH of CAD, CVA, HTN, HLD, DM-2, cervical spine fusion and OA presented to ED after fall at home and found to have traumatic right frontal SDH measuring 2.2 cm in maximum thickness with 5 to 6 mm midline shift to the left, and trace SAH at the right temporal lobe.  Patient was on Plavix and aspirin.  CT cervical spine without acute finding but some sclerotic changes at C2-C3 facet joint.  He was admitted to ICU by neurosurgery. He was initially minimally symptomatic and refused surgery.  However, he developed intermittent focal seizure.    Repeat CT head on 5/18 with stable SDH and SAH.  MRI brain showed multiple small/punctate areas of restricted diffusion with bilateral frontal and parietal cortex suspicious for tiny acute infarcts or small intraparenchymal contusion and/or shear type injury in the setting of trauma.  EEG on 5/21 showed intermittent seizure.  Neurology consulted.  Patient was started on Keppra and Vimpat.  Palliative medicine consulted as well.   Patient was transferred to hospitalist service on 5/23.  Subsequent LTM EEG without seizure.  Neurology signed off.  Encephalopathy improved but seems to be delirious.  Remains NPO except some pured diet from nursing stock.  He is on tube feed via cortrack.  SLP following.  Therapy recommended SNF. Patient remains very sleepy , SLP unable to assess him due to sleepiness. Repeat CT head without contrast showed unchanged subdural hematoma.     Assessment & Plan    Assessment and Plan:   Fall at home, left frontal head laceration S/p repair with stitches. Sutures were removed on 08/27/2022    Traumatic right  subdural hematoma/subarachnoid hemorrhage with midline shift due to fall and antiplatelet therapy Repeat CT shows stable hematoma Neurosurgery recommended no surgical intervention at this time. Aspirin Plavix on hold for 1 week and then resume. Neurosurgery recommended outpatient follow-up. Therapy evaluations recommending SNF    Provoked seizures probably secondary to right subdural hematoma and subarachnoid hemorrhage with midline shift EEG showed seizures on 09/01/2022 Subsequent LTM EEG with evidence of epileptogenicity arising from the right central parietal region.   Patient is currently on Keppra 1000 mg twice daily and Vimpat 100 mg twice daily and clonazepam 2 mg as needed for seizures.   Acute encephalopathy probably secondary to the subdural hematoma and provoked seizures in the setting of memory deficits Reorientation and delirium precautions Avoid/minimize sedating medications.  Patient remains sleepy , with intermittently being awake and eating some floor stock and pudding.  TSH wnl, ammonia is normal. Repeat CT head showed unchanged sub dural hematoma.  MBS scheduled for Monday.    Pain back pain with some bruising seen due to fall X-rays are negative for any acute findings Pain control, Lidoderm patch and therapy evaluations.  Acute blood loss anemia no obvious signs of bleeding no significant iron deficiency on anemia panel hemoglobin improved after 2 units of PRBC transfusion. Continue to monitor as needed. Hemoglobin stable around 10.      History of coronary artery disease s/p PCI/DES stent to LCx/OM 2 in January 2024 Continue with Plavix, low-dose aspirin and Coreg    Non-insulin-dependent diabetes mellitus with hyperglycemia  CBGs are better controlled Continue with sliding scale insulin CBG (last 3)  Recent Labs    09/01/22 0405 09/01/22 0734 09/01/22 1148  GLUCAP 148* 181* 151*    No changes in meds.     Hypertension Blood pressure  parameters are optimal.     History of CVA Continue with aspirin Plavix and Lipitor    Hypokalemia and hypophosphatemia Replaced   Hyponatremia Sodium is stable around 132.     Dysphagia Patient on honey thickened pure from nursing. Speech therapy evaluations recommending tube feeds for nutritional supplementation  Mood disorder Continue with home Zoloft.     severe malnutrition Dietary on board  recommendations are to continue with supplementation at this time   Malnutrition Type:  Nutrition Problem: Severe Malnutrition Etiology: chronic illness (fall)   Malnutrition Characteristics:  Signs/Symptoms: severe fat depletion, severe muscle depletion   Nutrition Interventions:  Interventions: Tube feeding, Prostat, MVI  Estimated body mass index is 23.51 kg/m as calculated from the following:   Height as of this encounter: 5\' 7"  (1.702 m).   Weight as of this encounter: 68.1 kg.  Code Status: DNR DVT Prophylaxis:  enoxaparin (LOVENOX) injection 40 mg Start: 08/28/22 1000 Place and maintain sequential compression device Start: 08/25/22 0839 SCDs Start: 08/18/22 2106   Level of Care: Level of care: Med-Surg Family Communication: Updated patient's family at bedside  Disposition Plan:     Remains inpatient appropriate: Waiting for SNF  Procedures:  CT head without contrast.   Consultants:   PCCM.   Antimicrobials:   Anti-infectives (From admission, onward)    None        Medications  Scheduled Meds:  amLODipine  10 mg Per Tube Daily   aspirin  81 mg Per Tube Daily   atorvastatin  80 mg Per Tube Daily   carvedilol  12.5 mg Per Tube BID WC   clopidogrel  75 mg Per Tube Daily   enoxaparin (LOVENOX) injection  40 mg Subcutaneous Q24H   feeding supplement (PROSource TF20)  60 mL Per Tube BID   Gerhardt's butt cream   Topical QID   insulin aspart  0-15 Units Subcutaneous Q4H   insulin aspart  3 Units Subcutaneous Q4H   insulin glargine-yfgn   15 Units Subcutaneous Daily   lidocaine  1 patch Transdermal Q24H   multivitamin with minerals  1 tablet Per Tube Daily   mouth rinse  15 mL Mouth Rinse 4 times per day   sertraline  25 mg Per Tube QPM   Continuous Infusions:  sodium chloride Stopped (08/28/22 1547)   feeding supplement (JEVITY 1.5 CAL/FIBER) 1,000 mL (09/01/22 0125)   lacosamide (VIMPAT) IV 100 mg (09/01/22 1029)   levETIRAcetam 1,000 mg (09/01/22 0845)   PRN Meds:.sodium chloride, acetaminophen (TYLENOL) oral liquid 160 mg/5 mL, [DISCONTINUED] acetaminophen **OR** acetaminophen, clonazepam, labetalol, [DISCONTINUED] ondansetron **OR** ondansetron (ZOFRAN) IV, mouth rinse, mouth rinse    Subjective:   Kingdom Dobrin was seen and examined today.  A little more alert today.   Objective:   Vitals:   09/01/22 0405 09/01/22 0500 09/01/22 0732 09/01/22 1149  BP: Marland Kitchen)  150/67  (!) 140/70 127/60  Pulse: 84  84 71  Resp: 18  19 18   Temp: 99.1 F (37.3 C)  98.7 F (37.1 C) 98.3 F (36.8 C)  TempSrc: Oral  Oral Oral  SpO2: 94%  93% 95%  Weight:  68.1 kg    Height:        Intake/Output Summary (Last 24 hours) at 09/01/2022 1437 Last data filed at 09/01/2022 0405 Gross per 24 hour  Intake 840 ml  Output 2150 ml  Net -1310 ml    Filed Weights   08/30/22 0500 08/31/22 0500 09/01/22 0500  Weight: 66.8 kg 66 kg 68.1 kg     Exam General exam: Appears calm and comfortable  Respiratory system: Clear to auscultation. Respiratory effort normal. Cardiovascular system: S1 & S2 heard, RRR.  Gastrointestinal system: Abdomen is nondistended, soft and nontender.  Central nervous system: lethargic, but better than yesterday, able to say a few words.  Extremities: Symmetric 5 x 5 power. Skin: No rashes, lesions or ulcers Psychiatry: calm.     Data Reviewed:  I have personally reviewed following labs and imaging studies   CBC Lab Results  Component Value Date   WBC 11.6 (H) 08/31/2022   RBC 3.55 (L) 08/31/2022    HGB 10.9 (L) 08/31/2022   HCT 33.5 (L) 08/31/2022   MCV 94.4 08/31/2022   MCH 30.7 08/31/2022   PLT 408 (H) 08/31/2022   MCHC 32.5 08/31/2022   RDW 15.5 08/31/2022   LYMPHSABS 1.7 08/31/2022   MONOABS 0.9 08/31/2022   EOSABS 0.5 08/31/2022   BASOSABS 0.0 08/31/2022     Last metabolic panel Lab Results  Component Value Date   NA 132 (L) 08/31/2022   K 4.7 08/31/2022   CL 98 08/31/2022   CO2 24 08/31/2022   BUN 40 (H) 08/31/2022   CREATININE 0.80 08/31/2022   GLUCOSE 149 (H) 08/31/2022   GFRNONAA >60 08/31/2022   GFRAA >60 06/06/2017   CALCIUM 8.6 (L) 08/31/2022   PHOS 3.0 08/30/2022   PROT 6.4 (L) 08/21/2022   ALBUMIN 2.3 (L) 08/30/2022   BILITOT 0.8 08/21/2022   ALKPHOS 93 08/21/2022   AST 19 08/21/2022   ALT 18 08/21/2022   ANIONGAP 10 08/31/2022    CBG (last 3)  Recent Labs    09/01/22 0405 09/01/22 0734 09/01/22 1148  GLUCAP 148* 181* 151*       Coagulation Profile: No results for input(s): "INR", "PROTIME" in the last 168 hours.   Radiology Studies: CT HEAD WO CONTRAST ( )  Result Date: 09/01/2022 CLINICAL DATA:  Mental status change, unknown cause EXAM: CT HEAD WITHOUT CONTRAST TECHNIQUE: Contiguous axial images were obtained from the base of the skull through the vertex without intravenous contrast. RADIATION DOSE REDUCTION: This exam was performed according to the departmental dose-optimization program which includes automated exposure control, adjustment of the mA and/or kV according to patient size and/or use of iterative reconstruction technique. COMPARISON:  CT Aug 21, 2022. FINDINGS: Brain: Unchanged size of a 1.6 cm thickness recent right subdural hematoma which is mildly decreased in attenuation. Similar mass effect with trace leftward midline shift. Similar smaller basal attenuating left subdural collection. No evidence of new/interval hemorrhage. No evidence of acute large vascular territory infarct. Similar remote left occipital infarct.  Vascular: No hyperdense vessel identified. Skull: No acute fracture. Sinuses/Orbits: Left maxillary sinus frothy secretions with mucosal thickening. No acute orbital findings. Other: No mastoid effusions. IMPRESSION: 1. Unchanged size of a 1.6 cm thickness recent right subdural hematoma  which is mildly decreased in attenuation. Similar mass effect with trace leftward midline shift. 2. Similar smaller isoattenuating left subdural collection. 3. Remote left occipital infarct. Electronically Signed   By: Feliberto Harts M.D.   On: 09/01/2022 13:07       Kathlen Mody M.D. [facility] 09/01/2022, 2:37 PM  Available via Epic secure chat 7am-7pm After 7 pm, please refer to night coverage provider listed on amion.

## 2022-09-02 DIAGNOSIS — G9340 Encephalopathy, unspecified: Secondary | ICD-10-CM | POA: Diagnosis not present

## 2022-09-02 DIAGNOSIS — S065X0D Traumatic subdural hemorrhage without loss of consciousness, subsequent encounter: Secondary | ICD-10-CM | POA: Diagnosis not present

## 2022-09-02 DIAGNOSIS — E43 Unspecified severe protein-calorie malnutrition: Secondary | ICD-10-CM | POA: Diagnosis not present

## 2022-09-02 DIAGNOSIS — R569 Unspecified convulsions: Secondary | ICD-10-CM | POA: Diagnosis not present

## 2022-09-02 LAB — URINALYSIS, W/ REFLEX TO CULTURE (INFECTION SUSPECTED)
Bacteria, UA: NONE SEEN
Bilirubin Urine: NEGATIVE
Glucose, UA: NEGATIVE mg/dL
Hgb urine dipstick: NEGATIVE
Ketones, ur: NEGATIVE mg/dL
Leukocytes,Ua: NEGATIVE
Nitrite: NEGATIVE
Protein, ur: 30 mg/dL — AB
Specific Gravity, Urine: 1.019 (ref 1.005–1.030)
pH: 6 (ref 5.0–8.0)

## 2022-09-02 LAB — GLUCOSE, CAPILLARY
Glucose-Capillary: 115 mg/dL — ABNORMAL HIGH (ref 70–99)
Glucose-Capillary: 126 mg/dL — ABNORMAL HIGH (ref 70–99)
Glucose-Capillary: 137 mg/dL — ABNORMAL HIGH (ref 70–99)
Glucose-Capillary: 157 mg/dL — ABNORMAL HIGH (ref 70–99)
Glucose-Capillary: 159 mg/dL — ABNORMAL HIGH (ref 70–99)
Glucose-Capillary: 206 mg/dL — ABNORMAL HIGH (ref 70–99)

## 2022-09-02 MED ORDER — LEVETIRACETAM 100 MG/ML PO SOLN
500.0000 mg | Freq: Two times a day (BID) | ORAL | Status: DC
  Administered 2022-09-02 – 2022-09-03 (×4): 500 mg
  Filled 2022-09-02 (×3): qty 5

## 2022-09-02 NOTE — Progress Notes (Signed)
[facility]                                                                               Ankit Kopecky, is a 82 y.o. male, DOB - [birth year], UJW:[id number] Admit date - 08/18/2022    Outpatient Primary MD for the patient is Gracelyn Nurse, MD  LOS - 15  days    Brief summary   82 year old M with PMH of CAD, CVA, HTN, HLD, DM-2, cervical spine fusion and OA presented to ED after fall at home and found to have traumatic right frontal SDH measuring 2.2 cm in maximum thickness with 5 to 6 mm midline shift to the left, and trace SAH at the right temporal lobe.  Patient was on Plavix and aspirin.  CT cervical spine without acute finding but some sclerotic changes at C2-C3 facet joint.  He was admitted to ICU by neurosurgery. He was initially minimally symptomatic and refused surgery.  However, he developed intermittent focal seizure.    Repeat CT head on 5/18 with stable SDH and SAH.  MRI brain showed multiple small/punctate areas of restricted diffusion with bilateral frontal and parietal cortex suspicious for tiny acute infarcts or small intraparenchymal contusion and/or shear type injury in the setting of trauma.  EEG on 5/21 showed intermittent seizure.  Neurology consulted.  Patient was started on Keppra and Vimpat.  Palliative medicine consulted as well.   Patient was transferred to hospitalist service on 5/23.  Subsequent LTM EEG without seizure.  Neurology signed off.  Encephalopathy improved but seems to be delirious.  Remains NPO except some pured diet from nursing stock.  He is on tube feed via cortrack.  SLP following.  Therapy recommended SNF. Patient remains very sleepy , SLP unable to assess him due to sleepiness. Repeat CT head without contrast showed unchanged subdural hematoma.     Assessment & Plan    Assessment and Plan:   Fall at home, left frontal head laceration S/p repair with stitches. Sutures were removed on 08/27/2022    Traumatic right  subdural hematoma/subarachnoid hemorrhage with midline shift due to fall and antiplatelet therapy Repeat CT shows stable hematoma Neurosurgery recommended no surgical intervention at this time. Aspirin Plavix on hold for 1 week and then resume. Neurosurgery recommended outpatient follow-up. Therapy evaluations recommending SNF    Provoked seizures probably secondary to right subdural hematoma and subarachnoid hemorrhage with midline shift EEG showed seizures on 09/01/2022 Subsequent LTM EEG with evidence of epileptogenicity arising from the right central parietal region.   Patient is currently on Keppra 1000 mg twice daily, decreased to 500 mg BID today and Vimpat 100 mg twice daily and clonazepam 2 mg as needed for seizures.   Acute encephalopathy probably secondary to the subdural hematoma and provoked seizures in the setting of memory deficits Reorientation and delirium precautions Avoid/minimize sedating medications.  Patient remains sleepy , with intermittently being awake and eating some floor stock and pudding.  TSH wnl, ammonia is normal. Repeat CT head showed unchanged sub dural hematoma. UA is negative for infection.  Discussed with neurologist Dr Melynda Ripple, to see if seizure meds can be decreased in dose for excessive lethargy.  Was on  keppra 1000 mg BID , decreased to 500 mg BID.  MBS scheduled for Monday.    Pain back pain with some bruising seen due to fall X-rays are negative for any acute findings Pain control, Lidoderm patch and therapy evaluations.    Acute blood loss anemia no obvious signs of bleeding no significant iron deficiency on anemia panel hemoglobin improved after 2 units of PRBC transfusion. Continue to monitor as needed. Hemoglobin stable around 10.    History of coronary artery disease s/p PCI/DES stent to LCx/OM 2 in January 2024 Continue with Plavix, low-dose aspirin and Coreg    Non-insulin-dependent diabetes mellitus with hyperglycemia  CBGs  are better controlled Continue with sliding scale insulin CBG (last 3)  Recent Labs    09/02/22 0314 09/02/22 0734 09/02/22 1158  GLUCAP 126* 115* 206*    No changes in meds.     Hypertension Blood pressure parameters are well controlled.    History of CVA Continue with aspirin Plavix and Lipitor  Hypokalemia and hypophosphatemia Replaced   Hyponatremia Sodium is stable around 132.     Dysphagia Patient on honey thickened pure from nursing. Speech therapy evaluations recommending tube feeds for nutritional supplementation  Mood disorder Continue with home Zoloft.     severe malnutrition Dietary on board  recommendations are to continue with supplementation at this time   Malnutrition Type:  Nutrition Problem: Severe Malnutrition Etiology: chronic illness (fall)   Malnutrition Characteristics:  Signs/Symptoms: severe fat depletion, severe muscle depletion   Nutrition Interventions:  Interventions: Tube feeding, Prostat, MVI  Estimated body mass index is 23.07 kg/m as calculated from the following:   Height as of this encounter: 5\' 7"  (1.702 m).   Weight as of this encounter: 66.8 kg.  Code Status: DNR DVT Prophylaxis:  enoxaparin (LOVENOX) injection 40 mg Start: 08/28/22 1000 Place and maintain sequential compression device Start: 08/25/22 0839 SCDs Start: 08/18/22 2106   Level of Care: Level of care: Med-Surg Family Communication: Updated patient's family at bedside  Disposition Plan:     Remains inpatient appropriate: Waiting for SNF  Procedures:  CT head without contrast.   Consultants:   PCCM.   Antimicrobials:   Anti-infectives (From admission, onward)    None        Medications  Scheduled Meds:  amLODipine  10 mg Per Tube Daily   aspirin  81 mg Per Tube Daily   atorvastatin  80 mg Per Tube Daily   carvedilol  12.5 mg Per Tube BID WC   clopidogrel  75 mg Per Tube Daily   enoxaparin (LOVENOX) injection  40 mg  Subcutaneous Q24H   feeding supplement (PROSource TF20)  60 mL Per Tube BID   Gerhardt's butt cream   Topical QID   insulin aspart  0-15 Units Subcutaneous Q4H   insulin aspart  3 Units Subcutaneous Q4H   insulin glargine-yfgn  15 Units Subcutaneous Daily   levETIRAcetam  500 mg Per Tube BID   lidocaine  1 patch Transdermal Q24H   multivitamin with minerals  1 tablet Per Tube Daily   mouth rinse  15 mL Mouth Rinse 4 times per day   sertraline  25 mg Per Tube QPM   Continuous Infusions:  sodium chloride Stopped (08/28/22 1547)   feeding supplement (JEVITY 1.5 CAL/FIBER) 1,000 mL (09/01/22 0125)   lacosamide (VIMPAT) IV 100 mg (09/02/22 1052)   PRN Meds:.sodium chloride, acetaminophen (TYLENOL) oral liquid 160 mg/5 mL, [DISCONTINUED] acetaminophen **OR** acetaminophen, clonazepam, labetalol, [DISCONTINUED] ondansetron **OR** ondansetron (ZOFRAN)  IV, mouth rinse, mouth rinse    Subjective:   Izea Brensinger was seen and examined today.  No new events overnight.   Objective:   Vitals:   09/02/22 0500 09/02/22 0733 09/02/22 0733 09/02/22 1158  BP:  135/78 135/78 111/65  Pulse:  84 84 77  Resp:  19 19 18   Temp:  99.3 F (37.4 C) 99.3 F (37.4 C) 98.9 F (37.2 C)  TempSrc:  Oral Oral Oral  SpO2:  95% 95% 94%  Weight: 66.8 kg     Height:        Intake/Output Summary (Last 24 hours) at 09/02/2022 1508 Last data filed at 09/02/2022 0400 Gross per 24 hour  Intake 91.55 ml  Output 550 ml  Net -458.45 ml    Filed Weights   08/31/22 0500 09/01/22 0500 09/02/22 0500  Weight: 66 kg 68.1 kg 66.8 kg     Exam General exam: Appears calm and comfortable  Respiratory system: Clear to auscultation. Respiratory effort normal. Cardiovascular system: S1 & S2 heard, RRR. No JVD,  Gastrointestinal system: Abdomen is nondistended, soft and nontender. Central nervous system: wakes up to verbal cues , able to  move all extremities.  Extremities: no pedal edema.  Skin: No rashes,   Psychiatry:calm.      Data Reviewed:  I have personally reviewed following labs and imaging studies   CBC Lab Results  Component Value Date   WBC 11.6 (H) 08/31/2022   RBC 3.55 (L) 08/31/2022   HGB 10.9 (L) 08/31/2022   HCT 33.5 (L) 08/31/2022   MCV 94.4 08/31/2022   MCH 30.7 08/31/2022   PLT 408 (H) 08/31/2022   MCHC 32.5 08/31/2022   RDW 15.5 08/31/2022   LYMPHSABS 1.7 08/31/2022   MONOABS 0.9 08/31/2022   EOSABS 0.5 08/31/2022   BASOSABS 0.0 08/31/2022     Last metabolic panel Lab Results  Component Value Date   NA 132 (L) 08/31/2022   K 4.7 08/31/2022   CL 98 08/31/2022   CO2 24 08/31/2022   BUN 40 (H) 08/31/2022   CREATININE 0.80 08/31/2022   GLUCOSE 149 (H) 08/31/2022   GFRNONAA >60 08/31/2022   GFRAA >60 06/06/2017   CALCIUM 8.6 (L) 08/31/2022   PHOS 3.0 08/30/2022   PROT 6.4 (L) 08/21/2022   ALBUMIN 2.3 (L) 08/30/2022   BILITOT 0.8 08/21/2022   ALKPHOS 93 08/21/2022   AST 19 08/21/2022   ALT 18 08/21/2022   ANIONGAP 10 08/31/2022    CBG (last 3)  Recent Labs    09/02/22 0314 09/02/22 0734 09/02/22 1158  GLUCAP 126* 115* 206*       Coagulation Profile: No results for input(s): "INR", "PROTIME" in the last 168 hours.   Radiology Studies: Overnight EEG with video  Result Date: 09/02/2022 Charlsie Quest, MD     09/02/2022  8:40 AM Patient Name: Raymond Sampson MRN: [id number] Epilepsy Attending: Charlsie Quest Referring Physician/Provider: Jefferson Fuel, MD Duration: 09/01/2019 1715 to 09/02/2022 0830  Patient history: 82 y.o. male with history of CAD, stroke, diabetes, hyperlipidemia, hypertension and paroxysmal atrial tachycardia who presented originally after falling off of 1 step off his deck at home and having a head injury with a large right-sided subdural hematoma.  He was initially alert and oriented and mentally at his baseline, but yesterday around 1300, he became more drowsy and was only able to intermittently follow commands and  intermittently answer questions. EEG to evaluate for seizure.  Level of alertness: Awake, asleep  AEDs  during EEG study: LEV, LCM  Technical aspects: This EEG study was done with scalp electrodes positioned according to the 10-20 International system of electrode placement. Electrical activity was reviewed with band pass filter of 1-70Hz , sensitivity of 7 uV/mm, display speed of 68mm/sec with a 60Hz  notched filter applied as appropriate. EEG data were recorded continuously and digitally stored.  Video monitoring was available and reviewed as appropriate.  Description: The posterior dominant rhythm consists of 8-9 Hz activity of moderate voltage (25-35 uV) seen predominantly in posterior head regions, symmetric and reactive to eye opening and eye closing. Sleep was characterized by vertex waves, sleep spindles (12 to 14 Hz), maximal frontocentral region.  EEG showed intermittent generalized and lateralized right hemisphere 3-7 theta-delta slowing. Hyperventilation and photic stimulation were not performed.    ABNORMALITY - Intermittent slow, generalized and lateralized right hemisphere  IMPRESSION: This study is suggestive of cortical dysfunction in right hemisphere likely secondary to underlying structural abnormality. Additionally there is mild diffuse encephalopathy. No seizures or definite epileptiform discharges were noted.  Priyanka Annabelle Harman  CT HEAD WO CONTRAST ( )  Result Date: 09/01/2022 CLINICAL DATA:  Mental status change, unknown cause EXAM: CT HEAD WITHOUT CONTRAST TECHNIQUE: Contiguous axial images were obtained from the base of the skull through the vertex without intravenous contrast. RADIATION DOSE REDUCTION: This exam was performed according to the departmental dose-optimization program which includes automated exposure control, adjustment of the mA and/or kV according to patient size and/or use of iterative reconstruction technique. COMPARISON:  CT Aug 21, 2022. FINDINGS: Brain: Unchanged size  of a 1.6 cm thickness recent right subdural hematoma which is mildly decreased in attenuation. Similar mass effect with trace leftward midline shift. Similar smaller basal attenuating left subdural collection. No evidence of new/interval hemorrhage. No evidence of acute large vascular territory infarct. Similar remote left occipital infarct. Vascular: No hyperdense vessel identified. Skull: No acute fracture. Sinuses/Orbits: Left maxillary sinus frothy secretions with mucosal thickening. No acute orbital findings. Other: No mastoid effusions. IMPRESSION: 1. Unchanged size of a 1.6 cm thickness recent right subdural hematoma which is mildly decreased in attenuation. Similar mass effect with trace leftward midline shift. 2. Similar smaller isoattenuating left subdural collection. 3. Remote left occipital infarct. Electronically Signed   By: Feliberto Harts M.D.   On: 09/01/2022 13:07       Kathlen Mody M.D. [facility] 09/02/2022, 3:08 PM  Available via Epic secure chat 7am-7pm After 7 pm, please refer to night coverage provider listed on amion.

## 2022-09-02 NOTE — Procedures (Signed)
Patient Name: Raymond Sampson  MRN: [id number]  Epilepsy Attending: Charlsie Quest  Referring Physician/Provider: Jefferson Fuel, MD  Duration: 09/01/2019 1715 to 09/02/2022 1715   Patient history: 82 y.o. male with history of CAD, stroke, diabetes, hyperlipidemia, hypertension and paroxysmal atrial tachycardia who presented originally after falling off of 1 step off his deck at home and having a head injury with a large right-sided subdural hematoma.  He was initially alert and oriented and mentally at his baseline, but yesterday around 1300, he became more drowsy and was only able to intermittently follow commands and intermittently answer questions. EEG to evaluate for seizure.   Level of alertness: Awake, asleep   AEDs during EEG study: LEV, LCM   Technical aspects: This EEG study was done with scalp electrodes positioned according to the 10-20 International system of electrode placement. Electrical activity was reviewed with band pass filter of 1-70Hz , sensitivity of 7 uV/mm, display speed of 72mm/sec with a 60Hz  notched filter applied as appropriate. EEG data were recorded continuously and digitally stored.  Video monitoring was available and reviewed as appropriate.   Description: The posterior dominant rhythm consists of 8-9 Hz activity of moderate voltage (25-35 uV) seen predominantly in posterior head regions, symmetric and reactive to eye opening and eye closing. Sleep was characterized by vertex waves, sleep spindles (12 to 14 Hz), maximal frontocentral region.  EEG showed intermittent generalized and lateralized right hemisphere 3-7 theta-delta slowing. Hyperventilation and photic stimulation were not performed.      ABNORMALITY - Intermittent slow, generalized and lateralized right hemisphere   IMPRESSION: This study is suggestive of cortical dysfunction in right hemisphere likely secondary to underlying structural abnormality. Additionally there is mild diffuse encephalopathy. No  seizures or definite epileptiform discharges were noted.   Winfrey Chillemi Annabelle Harman

## 2022-09-02 NOTE — Care Plan (Signed)
DR Blake Divine requested recommendations as patient is excessively drowsy. CTH didn't show any worsening of SDH/edema.   Recommend reducing K eppra to 500mg  BID to minimize sedation and continue video eeg to monitor for seizure recurrence. I will b ehappy to see patient on Monday. Please call neurohospitalist for any urgent questions in the meantime.   Discussed with Dr Blake Divine via secure chat.  Raymond Sampson Annabelle Harman

## 2022-09-02 NOTE — Progress Notes (Signed)
EEG maint complete.  ?

## 2022-09-02 NOTE — Plan of Care (Signed)
  Problem: Health Behavior/Discharge Planning: Goal: Ability to manage health-related needs will improve Outcome: Progressing   Problem: Clinical Measurements: Goal: Will remain free from infection Outcome: Progressing Goal: Diagnostic test results will improve Outcome: Progressing Goal: Respiratory complications will improve Outcome: Progressing   

## 2022-09-03 ENCOUNTER — Inpatient Hospital Stay (HOSPITAL_COMMUNITY): Payer: PPO

## 2022-09-03 DIAGNOSIS — R569 Unspecified convulsions: Secondary | ICD-10-CM | POA: Diagnosis not present

## 2022-09-03 DIAGNOSIS — Z515 Encounter for palliative care: Secondary | ICD-10-CM | POA: Diagnosis not present

## 2022-09-03 DIAGNOSIS — E43 Unspecified severe protein-calorie malnutrition: Secondary | ICD-10-CM | POA: Diagnosis not present

## 2022-09-03 DIAGNOSIS — S065XAA Traumatic subdural hemorrhage with loss of consciousness status unknown, initial encounter: Secondary | ICD-10-CM | POA: Diagnosis not present

## 2022-09-03 DIAGNOSIS — Z7189 Other specified counseling: Secondary | ICD-10-CM | POA: Diagnosis not present

## 2022-09-03 LAB — CBC WITH DIFFERENTIAL/PLATELET
Abs Immature Granulocytes: 0.14 10*3/uL — ABNORMAL HIGH (ref 0.00–0.07)
Basophils Absolute: 0 10*3/uL (ref 0.0–0.1)
Basophils Relative: 0 %
Eosinophils Absolute: 0.4 10*3/uL (ref 0.0–0.5)
Eosinophils Relative: 3 %
HCT: 31 % — ABNORMAL LOW (ref 39.0–52.0)
Hemoglobin: 10 g/dL — ABNORMAL LOW (ref 13.0–17.0)
Immature Granulocytes: 1 %
Lymphocytes Relative: 15 %
Lymphs Abs: 2 10*3/uL (ref 0.7–4.0)
MCH: 30.2 pg (ref 26.0–34.0)
MCHC: 32.3 g/dL (ref 30.0–36.0)
MCV: 93.7 fL (ref 80.0–100.0)
Monocytes Absolute: 1 10*3/uL (ref 0.1–1.0)
Monocytes Relative: 7 %
Neutro Abs: 9.9 10*3/uL — ABNORMAL HIGH (ref 1.7–7.7)
Neutrophils Relative %: 74 %
Platelets: 443 10*3/uL — ABNORMAL HIGH (ref 150–400)
RBC: 3.31 MIL/uL — ABNORMAL LOW (ref 4.22–5.81)
RDW: 15.5 % (ref 11.5–15.5)
WBC: 13.4 10*3/uL — ABNORMAL HIGH (ref 4.0–10.5)
nRBC: 0 % (ref 0.0–0.2)

## 2022-09-03 LAB — COMPREHENSIVE METABOLIC PANEL
ALT: 23 U/L (ref 0–44)
AST: 27 U/L (ref 15–41)
Albumin: 2.5 g/dL — ABNORMAL LOW (ref 3.5–5.0)
Alkaline Phosphatase: 147 U/L — ABNORMAL HIGH (ref 38–126)
Anion gap: 7 (ref 5–15)
BUN: 51 mg/dL — ABNORMAL HIGH (ref 8–23)
CO2: 25 mmol/L (ref 22–32)
Calcium: 8.5 mg/dL — ABNORMAL LOW (ref 8.9–10.3)
Chloride: 99 mmol/L (ref 98–111)
Creatinine, Ser: 0.82 mg/dL (ref 0.61–1.24)
GFR, Estimated: >60 mL/min (ref >60)
Glucose, Bld: 136 mg/dL — ABNORMAL HIGH (ref 70–99)
Potassium: 4.6 mmol/L (ref 3.5–5.1)
Sodium: 131 mmol/L — ABNORMAL LOW (ref 135–145)
Total Bilirubin: 0.5 mg/dL (ref 0.3–1.2)
Total Protein: 6.4 g/dL — ABNORMAL LOW (ref 6.5–8.1)

## 2022-09-03 LAB — GLUCOSE, CAPILLARY
Glucose-Capillary: 138 mg/dL — ABNORMAL HIGH (ref 70–99)
Glucose-Capillary: 171 mg/dL — ABNORMAL HIGH (ref 70–99)
Glucose-Capillary: 182 mg/dL — ABNORMAL HIGH (ref 70–99)
Glucose-Capillary: 133 mg/dL — ABNORMAL HIGH (ref 70–99)
Glucose-Capillary: 167 mg/dL — ABNORMAL HIGH (ref 70–99)
Glucose-Capillary: 181 mg/dL — ABNORMAL HIGH (ref 70–99)

## 2022-09-03 MED ORDER — ORAL CARE MOUTH RINSE: 15.0000 mL | OROMUCOSAL | Status: DC | PRN

## 2022-09-03 MED ORDER — ORAL CARE MOUTH RINSE
15.0000 mL | OROMUCOSAL | Status: DC
  Administered 2022-09-03 – 2022-09-22 (×67): 15 mL via OROMUCOSAL

## 2022-09-03 MED ORDER — GUAIFENESIN-DM 100-10 MG/5ML PO SYRP
10.0000 mL | ORAL_SOLUTION | ORAL | Status: DC | PRN
  Administered 2022-09-21: 10 mL via ORAL
  Filled 2022-09-03: qty 10

## 2022-09-03 NOTE — Progress Notes (Signed)
Daily Progress Note   Patient Name: Raymond Sampson       Date: 09/03/2022 DOB: Jan 09, 1941  Age: 82 y.o. MRN#: [id number] Attending Physician: Kathlen Mody, MD Primary Care Physician: Gracelyn Nurse, MD Admit Date: 08/18/2022  Reason for Consultation/Follow-up: Establishing goals of care  Subjective: Patient sitting in bed being assessed by RN. He appears in NAD. Wife at bedside.  Length of Stay: 16  Current Medications: Scheduled Meds:   amLODipine  10 mg Per Tube Daily   aspirin  81 mg Per Tube Daily   atorvastatin  80 mg Per Tube Daily   carvedilol  12.5 mg Per Tube BID WC   clopidogrel  75 mg Per Tube Daily   enoxaparin (LOVENOX) injection  40 mg Subcutaneous Q24H   feeding supplement (PROSource TF20)  60 mL Per Tube BID   Gerhardt's butt cream   Topical QID   insulin aspart  0-15 Units Subcutaneous Q4H   insulin aspart  3 Units Subcutaneous Q4H   insulin glargine-yfgn  15 Units Subcutaneous Daily   levETIRAcetam  500 mg Per Tube BID   lidocaine  1 patch Transdermal Q24H   multivitamin with minerals  1 tablet Per Tube Daily   mouth rinse  15 mL Mouth Rinse 4 times per day   sertraline  25 mg Per Tube QPM    Continuous Infusions:  sodium chloride Stopped (08/28/22 1547)   feeding supplement (JEVITY 1.5 CAL/FIBER) 1,000 mL (09/03/22 0032)   lacosamide (VIMPAT) IV 100 mg (09/03/22 1040)    PRN Meds: sodium chloride, acetaminophen (TYLENOL) oral liquid 160 mg/5 mL, [DISCONTINUED] acetaminophen **OR** acetaminophen, clonazepam, guaiFENesin-dextromethorphan, labetalol, [DISCONTINUED] ondansetron **OR** ondansetron (ZOFRAN) IV, mouth rinse, mouth rinse  Physical Exam Vitals reviewed.  Constitutional:      Appearance: He is ill-appearing.  HENT:     Head:     Comments:  EEG Neurological:     Mental Status: He is alert.             Vital Signs: BP (!) 147/61 (BP Location: Right Arm)   Pulse 84   Temp 98.6 F (37 C) (Oral)   Resp 16   Ht 5\' 7"  (1.702 m)   Wt 66.6 kg   SpO2 94%   BMI 23.00 kg/m  SpO2: SpO2: 94 % O2 Device: O2 Device:  Room Air O2 Flow Rate: O2 Flow Rate (L/min): 2 L/min  Intake/output summary:  Intake/Output Summary (Last 24 hours) at 09/03/2022 1123 Last data filed at 09/03/2022 1040 Gross per 24 hour  Intake 260 ml  Output 1150 ml  Net -890 ml   LBM: Last BM Date : 09/02/22 Baseline Weight: Weight: 65.2 kg Most recent weight: Weight: 66.6 kg       Palliative Assessment/Data: 20      Patient Active Problem List   Diagnosis Date Noted   Protein-calorie malnutrition, severe 08/24/2022   Provoked seizures (HCC) 08/23/2022   Encephalopathy acute 08/23/2022   DNR (do not resuscitate) 08/23/2022   Traumatic subdural hematoma without loss of consciousness (HCC) 08/18/2022   Fall 06/22/2022   Malnutrition of moderate degree 06/22/2022   Acute blood loss anemia 06/21/2022   Multiple rib fractures involving four or more ribs, right side 06/20/2022   Intramuscular hematoma 06/19/2022   Rib fractures 06/19/2022   Hemothorax on right 06/19/2022   Leukocytosis 06/19/2022   Status post coronary artery stent placement 05/02/2022   Mild dementia (HCC) 04/14/2022   Non-ST elevation (NSTEMI) myocardial infarction (HCC) 04/13/2022   History of COVID-19 04/13/2022   Lung nodule 04/13/2022   PAD (peripheral artery disease) (HCC) 04/13/2022   Flu 06/05/2017   Memory loss, short term 02/07/2016   History of CVA (cerebrovascular accident) 02/07/2016   Diabetes mellitus type 2, uncomplicated (HCC) 04/06/2015   Carotid stenosis 05/14/2013   CAROTID BRUIT, LEFT 04/25/2010   PALPITATIONS 06/18/2009   Hyperlipidemia 04/27/2009   Essential hypertension 04/27/2009   Ischemic heart disease 04/27/2009    Palliative Care Assessment &  Plan   Patient Profile: 82 y.o. male with past medical history of CAD, stroke, DM2, HTN, HLD admitted on 08/18/2022 after a fall at his home with a large right-sided subdural hematoma. He was initially alert and oriented but on 08/21/22 he he became more drowsy and was only able to intermittently follow commands and intermittently answer questions. EEG to evaluate for seizure.   Assessment: The patient's wife reports the patient has been lethargic so his anti-seizure meds have been decreased and he appears less lethargic.   We discussed nutrition. The patient's wife and daughter have discussed PEG tube and they know he would not want one. This is also on his MOST form. We discussed encouraging him to continue with honey thick liquids when he is alert.  The patient did cough while I was in the room and his cough sounds stronger than it did last week.  Recommendations/Plan: DNR Allow time for outcomes Encourage family to continue conversations around GOC  Goals of Care and Additional Recommendations: Limitations on Scope of Treatment: Use MOST form for guidance  Code Status:    Code Status Orders  (From admission, onward)           Start     Ordered   08/19/22 0022  Do not attempt resuscitation (DNR)  Continuous       Question Answer Comment  If patient has no pulse and is not breathing Do Not Attempt Resuscitation   If patient has a pulse and/or is breathing: Medical Treatment Goals COMFORT MEASURES: Keep clean/warm/dry, use medication by any route; positioning, wound care and other measures to relieve pain/suffering; use oxygen, suction/manual treatment of airway obstruction for comfort; do not transfer unless for comfort needs.   Consent: Discussion documented in EHR or advanced directives reviewed      08/19/22 0023  Code Status History     Date Active Date Inactive Code Status Order ID Comments User Context   08/18/2022 2105 08/19/2022 0023 Full Code [id number]   Vonna Drafts ED   06/28/2022 1422 07/03/2022 2122 DNR [id number]  Morton Stall, NP Inpatient   06/19/2022 1721 06/28/2022 1422 Full Code [id number]  Floydene Flock, MD ED   05/02/2022 1135 05/03/2022 1523 Full Code [id number]  Yvonne Kendall, MD Inpatient   04/13/2022 0048 04/15/2022 2115 Full Code [id number]  Nolberto Hanlon, MD ED   06/05/2017 2015 06/07/2017 1757 Full Code [id number]  Ihor Austin, MD Inpatient       Prognosis:  Unable to determine  Discharge Planning: To Be Determined  Care plan was discussed with Dr. Blake Divine  Thank you for allowing the Palliative Medicine Team to assist in the care of this patient.  Time spent: 40 minutes   Detailed review of medical records ( labs, imaging, vital signs), medically appropriate exam, counseling and education to care partner, documenting clinical information, medication management, coordination of care.    Sherryll Burger, NP  Please contact Palliative Medicine Team phone at [phone] for questions and concerns.

## 2022-09-03 NOTE — Procedures (Signed)
Patient Name: RANSFORD VIELMAS  MRN: [id number]  Epilepsy Attending: Charlsie Quest  Referring Physician/Provider: Jefferson Fuel, MD  Duration: 09/02/2019 1715 to 09/03/2022  1715   Patient history: 82 y.o. male with history of CAD, stroke, diabetes, hyperlipidemia, hypertension and paroxysmal atrial tachycardia who presented originally after falling off of 1 step off his deck at home and having a head injury with a large right-sided subdural hematoma.  He was initially alert and oriented and mentally at his baseline, but yesterday around 1300, he became more drowsy and was only able to intermittently follow commands and intermittently answer questions. EEG to evaluate for seizure.   Level of alertness: Awake, asleep   AEDs during EEG study: LEV, LCM   Technical aspects: This EEG study was done with scalp electrodes positioned according to the 10-20 International system of electrode placement. Electrical activity was reviewed with band pass filter of 1-70Hz , sensitivity of 7 uV/mm, display speed of 44mm/sec with a 60Hz  notched filter applied as appropriate. EEG data were recorded continuously and digitally stored.  Video monitoring was available and reviewed as appropriate.   Description: The posterior dominant rhythm consists of 8-9 Hz activity of moderate voltage (25-35 uV) seen predominantly in posterior head regions, symmetric and reactive to eye opening and eye closing. Sleep was characterized by vertex waves, sleep spindles (12 to 14 Hz), maximal frontocentral region.  EEG showed intermittent generalized and lateralized right hemisphere 3-7 theta-delta slowing. Hyperventilation and photic stimulation were not performed.      ABNORMALITY - Intermittent slow, generalized and lateralized right hemisphere   IMPRESSION: This study is suggestive of cortical dysfunction in right hemisphere likely secondary to underlying structural abnormality. Additionally there is mild diffuse encephalopathy. No  seizures or definite epileptiform discharges were noted.   Jakie Debow Annabelle Harman

## 2022-09-03 NOTE — Progress Notes (Signed)
LTM maint complete - no skin breakdown under: FP2,F8.  A2 had a small area Pus

## 2022-09-03 NOTE — Progress Notes (Signed)
[facility]                                                                               Raymond Sampson, is a 82 y.o. male, DOB - 12-Oct-1940, ZOX:[id number] Admit date - 08/18/2022    Outpatient Primary MD for the patient is Gracelyn Nurse, MD  LOS - 16  days    Brief summary   82 year old M with PMH of CAD, CVA, HTN, HLD, DM-2, cervical spine fusion and OA presented to ED after fall at home and found to have traumatic right frontal SDH measuring 2.2 cm in maximum thickness with 5 to 6 mm midline shift to the left, and trace SAH at the right temporal lobe.  Patient was on Plavix and aspirin.  CT cervical spine without acute finding but some sclerotic changes at C2-C3 facet joint.  He was admitted to ICU by neurosurgery. He was initially minimally symptomatic and refused surgery.  However, he developed intermittent focal seizure.    Repeat CT head on 5/18 with stable SDH and SAH.  MRI brain showed multiple small/punctate areas of restricted diffusion with bilateral frontal and parietal cortex suspicious for tiny acute infarcts or small intraparenchymal contusion and/or shear type injury in the setting of trauma.  EEG on 5/21 showed intermittent seizure.  Neurology consulted.  Patient was started on Keppra and Vimpat.  Palliative medicine consulted as well.   Patient was transferred to hospitalist service on 5/23.  Subsequent LTM EEG without seizure.  Neurology signed off.  Encephalopathy improved but seems to be delirious.  Remains NPO except some pured diet from nursing stock.  He is on tube feed via cortrack.  SLP following.  Therapy recommended SNF. Patient remains very sleepy , SLP unable to assess him due to sleepiness. Repeat CT head without contrast showed unchanged subdural hematoma.     Assessment & Plan    Assessment and Plan:   Fall at home, left frontal head laceration S/p repair with stitches. Sutures were removed on 08/27/2022.     Traumatic right  subdural hematoma/subarachnoid hemorrhage with midline shift due to fall and antiplatelet therapy Repeat CT shows stable hematoma Neurosurgery recommended no surgical intervention at this time. Aspirin Plavix on hold for 1 week and then resume. Neurosurgery recommended outpatient follow-up. Therapy evaluations recommending SNF    Provoked seizures probably secondary to right subdural hematoma and subarachnoid hemorrhage with midline shift EEG showed seizures on 09/01/2022 Subsequent LTM EEG with evidence of epileptogenicity arising from the right central parietal region.   Patient is currently on Keppra 1000 mg twice daily, decreased to 500 mg BID today and Vimpat 100 mg twice daily and clonazepam 2 mg as needed for seizures.   Acute encephalopathy probably secondary to the subdural hematoma and provoked seizures in the setting of memory deficits Reorientation and delirium precautions Avoid/minimize sedating medications.  Patient remains sleepy , with intermittently being awake and eating some floor stock and pudding. No changes in the last 24 hours.  TSH wnl, ammonia is normal. Repeat CT head showed unchanged sub dural hematoma. UA is negative for infection.  Discussed with neurologist Dr Melynda Ripple, to see if seizure meds can be decreased  in dose for excessive lethargy.  Was on keppra 1000 mg BID , decreased to 500 mg BID.  MBS scheduled for Monday.    Pain back pain with some bruising seen due to fall X-rays are negative for any acute findings Pain control, Lidoderm patch and therapy evaluations.    Acute blood loss anemia no obvious signs of bleeding no significant iron deficiency on anemia panel hemoglobin improved after 2 units of PRBC transfusion. Continue to monitor as needed. Hemoglobin stable around 10.    History of coronary artery disease s/p PCI/DES stent to LCx/OM 2 in January 2024 Continue with Plavix, low-dose aspirin and Coreg    Non-insulin-dependent diabetes  mellitus with hyperglycemia  CBGs are better controlled Continue with sliding scale insulin CBG (last 3)  Recent Labs    09/03/22 0357 09/03/22 0846 09/03/22 1207  GLUCAP 138* 171* 182*    No changes in meds.     Hypertension Blood pressure parameters are optimal.    History of CVA Continue with aspirin, Plavix and Lipitor  Hypokalemia and hypophosphatemia Replaced  Coughing and congestion: CXR done , no pneumonia or fluid.  Continue with robitussin    Hyponatremia Sodium is stable around 130.    Dysphagia Patient on honey thickened pure from nursing. Speech therapy evaluations recommending tube feeds for nutritional supplementation. Patient is not alert enough in the last 4 days to participate in SLP evaluation.  MBS scheduled on Monday.   Mood disorder Continue with home Zoloft.     severe malnutrition Dietary on board  recommendations are to continue with tube feeds and  supplementation at this time   Malnutrition Type:  Nutrition Problem: Severe Malnutrition Etiology: chronic illness (fall)   Malnutrition Characteristics:  Signs/Symptoms: severe fat depletion, severe muscle depletion   Nutrition Interventions:  Interventions: Tube feeding, Prostat, MVI  Estimated body mass index is 23 kg/m as calculated from the following:   Height as of this encounter: 5\' 7"  (1.702 m).   Weight as of this encounter: 66.6 kg.  Code Status: DNR DVT Prophylaxis:  enoxaparin (LOVENOX) injection 40 mg Start: 08/28/22 1000 Place and maintain sequential compression device Start: 08/25/22 0839 SCDs Start: 08/18/22 2106   Level of Care: Level of care: Med-Surg Family Communication: Updated patient's family at bedside  Disposition Plan:     Remains inpatient appropriate: still encephalopathic, lethargic, not able to participate in SLP eval.  Procedures:  CT head without contrast.   Consultants:   PCCM.   Antimicrobials:   Anti-infectives (From  admission, onward)    None        Medications  Scheduled Meds:  amLODipine  10 mg Per Tube Daily   aspirin  81 mg Per Tube Daily   atorvastatin  80 mg Per Tube Daily   carvedilol  12.5 mg Per Tube BID WC   clopidogrel  75 mg Per Tube Daily   enoxaparin (LOVENOX) injection  40 mg Subcutaneous Q24H   feeding supplement (PROSource TF20)  60 mL Per Tube BID   Gerhardt's butt cream   Topical QID   insulin aspart  0-15 Units Subcutaneous Q4H   insulin aspart  3 Units Subcutaneous Q4H   insulin glargine-yfgn  15 Units Subcutaneous Daily   levETIRAcetam  500 mg Per Tube BID   lidocaine  1 patch Transdermal Q24H   multivitamin with minerals  1 tablet Per Tube Daily   mouth rinse  15 mL Mouth Rinse 4 times per day   sertraline  25 mg Per Tube  QPM   Continuous Infusions:  sodium chloride Stopped (08/28/22 1547)   feeding supplement (JEVITY 1.5 CAL/FIBER) 1,000 mL (09/03/22 0032)   lacosamide (VIMPAT) IV 100 mg (09/03/22 1040)   PRN Meds:.sodium chloride, acetaminophen (TYLENOL) oral liquid 160 mg/5 mL, [DISCONTINUED] acetaminophen **OR** acetaminophen, clonazepam, guaiFENesin-dextromethorphan, labetalol, [DISCONTINUED] ondansetron **OR** ondansetron (ZOFRAN) IV, mouth rinse, mouth rinse    Subjective:   Raymond Sampson was seen and examined today.  Remains lethargic, barely opening eyes.   Objective:   Vitals:   09/03/22 0356 09/03/22 0500 09/03/22 0844 09/03/22 1204  BP: 138/63  (!) 147/61 (!) 129/58  Pulse: 78  84 77  Resp: 16  16 18   Temp: 97.8 F (36.6 C)  98.6 F (37 C) 98.6 F (37 C)  TempSrc:   Oral Oral  SpO2: 96%  94% 93%  Weight:  66.6 kg    Height:        Intake/Output Summary (Last 24 hours) at 09/03/2022 1259 Last data filed at 09/03/2022 1040 Gross per 24 hour  Intake 260 ml  Output 1150 ml  Net -890 ml    Filed Weights   09/01/22 0500 09/02/22 0500 09/03/22 0500  Weight: 68.1 kg 66.8 kg 66.6 kg     Exam General exam: Ill appearing gentleman on  LTM s/p cortrak Respiratory system: air entry fair. No wheezing or rhonchi, coarse breath sounds.  Cardiovascular system: S1 & S2 heard, RRR. No pedal edema.  Gastrointestinal system: Abdomen is soft, non distended.  Central nervous system: non interactive. Still lethargic. S/p tube feeds.  Extremities: no pedal edema.  Skin: No rashes, Psychiatry: lethargic, unable to assess.      Data Reviewed:  I have personally reviewed following labs and imaging studies   CBC Lab Results  Component Value Date   WBC 13.4 (H) 09/03/2022   RBC 3.31 (L) 09/03/2022   HGB 10.0 (L) 09/03/2022   HCT 31.0 (L) 09/03/2022   MCV 93.7 09/03/2022   MCH 30.2 09/03/2022   PLT 443 (H) 09/03/2022   MCHC 32.3 09/03/2022   RDW 15.5 09/03/2022   LYMPHSABS 2.0 09/03/2022   MONOABS 1.0 09/03/2022   EOSABS 0.4 09/03/2022   BASOSABS 0.0 09/03/2022     Last metabolic panel Lab Results  Component Value Date   NA 131 (L) 09/03/2022   K 4.6 09/03/2022   CL 99 09/03/2022   CO2 25 09/03/2022   BUN 51 (H) 09/03/2022   CREATININE 0.82 09/03/2022   GLUCOSE 136 (H) 09/03/2022   GFRNONAA >60 09/03/2022   GFRAA >60 06/06/2017   CALCIUM 8.5 (L) 09/03/2022   PHOS 3.0 08/30/2022   PROT 6.4 (L) 09/03/2022   ALBUMIN 2.5 (L) 09/03/2022   BILITOT 0.5 09/03/2022   ALKPHOS 147 (H) 09/03/2022   AST 27 09/03/2022   ALT 23 09/03/2022   ANIONGAP 7 09/03/2022    CBG (last 3)  Recent Labs    09/03/22 0357 09/03/22 0846 09/03/22 1207  GLUCAP 138* 171* 182*       Coagulation Profile: No results for input(s): "INR", "PROTIME" in the last 168 hours.   Radiology Studies: DG CHEST PORT 1 VIEW  Result Date: 09/03/2022 CLINICAL DATA:  Cough. EXAM: PORTABLE CHEST 1 VIEW COMPARISON:  07/02/2018 FINDINGS: The heart size and mediastinal contours are within normal limits. Coronary artery stent noted. Feeding tube is seen extending into the stomach. Both lungs are clear. Two disconnected ports are noted in the right  hemithorax. IMPRESSION: No active disease. Electronically Signed   By: Jonny Ruiz  Geanie Cooley M.D.   On: 09/03/2022 09:54   Overnight EEG with video  Result Date: 09/02/2022 Charlsie Quest, MD     09/03/2022 10:25 AM Patient Name: DAMEAN KELDER MRN: [id number] Epilepsy Attending: Charlsie Quest Referring Physician/Provider: Jefferson Fuel, MD Duration: 09/01/2019 1715 to 09/02/2022 1715  Patient history: 82 y.o. male with history of CAD, stroke, diabetes, hyperlipidemia, hypertension and paroxysmal atrial tachycardia who presented originally after falling off of 1 step off his deck at home and having a head injury with a large right-sided subdural hematoma.  He was initially alert and oriented and mentally at his baseline, but yesterday around 1300, he became more drowsy and was only able to intermittently follow commands and intermittently answer questions. EEG to evaluate for seizure.  Level of alertness: Awake, asleep  AEDs during EEG study: LEV, LCM  Technical aspects: This EEG study was done with scalp electrodes positioned according to the 10-20 International system of electrode placement. Electrical activity was reviewed with band pass filter of 1-70Hz , sensitivity of 7 uV/mm, display speed of 11mm/sec with a 60Hz  notched filter applied as appropriate. EEG data were recorded continuously and digitally stored.  Video monitoring was available and reviewed as appropriate.  Description: The posterior dominant rhythm consists of 8-9 Hz activity of moderate voltage (25-35 uV) seen predominantly in posterior head regions, symmetric and reactive to eye opening and eye closing. Sleep was characterized by vertex waves, sleep spindles (12 to 14 Hz), maximal frontocentral region.  EEG showed intermittent generalized and lateralized right hemisphere 3-7 theta-delta slowing. Hyperventilation and photic stimulation were not performed.    ABNORMALITY - Intermittent slow, generalized and lateralized right hemisphere  IMPRESSION:  This study is suggestive of cortical dysfunction in right hemisphere likely secondary to underlying structural abnormality. Additionally there is mild diffuse encephalopathy. No seizures or definite epileptiform discharges were noted.  Priyanka Louie Boston M.D. [facility] 09/03/2022, 12:59 PM  Available via Epic secure chat 7am-7pm After 7 pm, please refer to night coverage provider listed on amion.

## 2022-09-03 NOTE — Progress Notes (Signed)
LTM lead FP2 repaired.

## 2022-09-04 DIAGNOSIS — Z7189 Other specified counseling: Secondary | ICD-10-CM | POA: Diagnosis not present

## 2022-09-04 DIAGNOSIS — Z515 Encounter for palliative care: Secondary | ICD-10-CM | POA: Diagnosis not present

## 2022-09-04 DIAGNOSIS — R569 Unspecified convulsions: Secondary | ICD-10-CM | POA: Diagnosis not present

## 2022-09-04 DIAGNOSIS — E43 Unspecified severe protein-calorie malnutrition: Secondary | ICD-10-CM | POA: Diagnosis not present

## 2022-09-04 DIAGNOSIS — S065XAA Traumatic subdural hemorrhage with loss of consciousness status unknown, initial encounter: Secondary | ICD-10-CM | POA: Diagnosis not present

## 2022-09-04 LAB — CREATININE, SERUM
Creatinine, Ser: 0.86 mg/dL (ref 0.61–1.24)
GFR, Estimated: >60 mL/min (ref >60)

## 2022-09-04 LAB — GLUCOSE, CAPILLARY
Glucose-Capillary: 143 mg/dL — ABNORMAL HIGH (ref 70–99)
Glucose-Capillary: 154 mg/dL — ABNORMAL HIGH (ref 70–99)
Glucose-Capillary: 166 mg/dL — ABNORMAL HIGH (ref 70–99)
Glucose-Capillary: 192 mg/dL — ABNORMAL HIGH (ref 70–99)
Glucose-Capillary: 212 mg/dL — ABNORMAL HIGH (ref 70–99)
Glucose-Capillary: 219 mg/dL — ABNORMAL HIGH (ref 70–99)

## 2022-09-04 NOTE — Progress Notes (Signed)
Speech Language Pathology Treatment: Dysphagia  Patient Details Name: Raymond Sampson MRN: [id number] DOB: 1940-11-20 Today's Date: 09/04/2022 Time: 4098-1191 SLP Time Calculation (min) (ACUTE ONLY): 25 min  Assessment / Plan / Recommendation Clinical Impression  Pt was seen for dysphagia treatment. He was lethargic during the session, but roused with verbal/tactile stimulation. Pt's wife was present and reported that the pt's alertness today is improved compared to that noted on prior days. RN unsure of when continuous EEG will be discontinued. Pt tolerated puree solids and thin liquids via spoon without overt s/s of aspiration. Mild oral holding was noted, but oral clearance was adequate without prompts. He demonstrated secondary swallows and coughing with thin liquids via cup, suggesting aspiration. Instrumental assessment is still recommended; MBS cannot be conducted due to pt's EEG, but improvement in alertness is also preferred. Pt may continue to have ice chips, sips of water via spoon, puree, and honey thick liquids via spoon after oral care once he is alert. SLP will continue to follow pt.     HPI HPI: 82 year old male who presented for fall. Hit forehead with laceration repair, no LOC, no facial fx, but CT 5/18 revealed: "stable large, approximately 17 mm thick Right Subdural Hematoma, most pronounced along the anterior right frontal convexity. Stable small volume right hemisphere [facility]." Chest CT 5/17: "Emphysema. Right pleural effusion. Hazy upper lobe airspace disease is indeterminate for edema or pneumonia." Neuro change noted by RN around 1300 on 5/19 with workup revealing seizures, now controlled with medication. Pt with history of CAD, prior stroke, hypertension, hyperlipidemia, type 2 diabetes, chronic antiplatelet use with aspirin and Plavix. MBS revealed hardware from ACDF. Cortrak placed 5/22;ST f/u for dysphagia tx/management      SLP Plan  Continue with current plan of care       Recommendations for follow up therapy are one component of a multi-disciplinary discharge planning process, led by the attending physician.  Recommendations may be updated based on patient status, additional functional criteria and insurance authorization.    Recommendations  Diet recommendations: NPO (If alert, pt may have ice chips, puree, honey thick liquids and thin water via spoon after oral care) Medication Administration: Via alternative means                  Oral care QID;Staff/trained caregiver to provide oral care;Oral care prior to ice chip/H20;Oral care before and after PO   Frequent or constant Supervision/Assistance Dysphagia, oropharyngeal phase (R13.12)     Continue with current plan of care   Kaidance Pantoja I. Vear Clock, MS, CCC-SLP Neuro Diagnostic Specialist  Acute Rehabilitation Services Office number: [phone]  Scheryl Marten  09/04/2022, 9:37 AM

## 2022-09-04 NOTE — Progress Notes (Signed)
Physical Therapy Treatment Patient Details Name: Raymond Sampson MRN: [id number] DOB: 05-Jan-1941 Today's Date: 09/04/2022   History of Present Illness 82 year old male admitted 08/18/22 after fall at home. CT head 5/18 revealed: large Right Subdural Hematoma, most pronounced along the anterior right frontal convexity.  Developed increased lethargy and change in pupilary size on 5/16 and MRI sowed multiple punctate areas of restricted diffusion in bilateral frontal and parietal cortex suspicious for tiny acute infarcts. LTM EEG positive for nonconvulsive seizures. Pt with history of CAD, prior stroke, hypertension, hyperlipidemia, type 2 diabetes, prior fall in March this year with SNF stay, chronic antiplatelet use with aspirin and Plavix.    PT Comments    Pt having continuous EEG monitoring during session today, continues to be very lethargic with very short periods of increased alertness. Pt worked on sitting balance and maintaining neutral, tends to lean posterior or anterior when brought past midline. Attempted standing with use of stedy but pt not initiating and then actively resisting standing up. Question whether it was his lethargy or fear of falling. Unable to achieve full stand. Patient will benefit from continued inpatient follow up therapy, <3 hours/day. PT will continue to follow.    Recommendations for follow up therapy are one component of a multi-disciplinary discharge planning process, led by the attending physician.  Recommendations may be updated based on patient status, additional functional criteria and insurance authorization.  Follow Up Recommendations  Can patient physically be transported by private vehicle: No    Assistance Recommended at Discharge Frequent or constant Supervision/Assistance  Patient can return home with the following Two people to help with walking and/or transfers;Two people to help with bathing/dressing/bathroom;Assist for transportation;Help with  stairs or ramp for entrance;Direct supervision/assist for medications management;Direct supervision/assist for financial management;Assistance with cooking/housework;Assistance with feeding   Equipment Recommendations  Wheelchair (measurements PT);Wheelchair cushion (measurements PT);Hospital bed;Other (comment) (air mattress; hoyer lift; if goes home instead)    Recommendations for Other Services       Precautions / Restrictions Precautions Precautions: Fall Precaution Comments: Seizures; cortrak Restrictions Weight Bearing Restrictions: No     Mobility  Bed Mobility Overal bed mobility: Needs Assistance Bed Mobility: Supine to Sit, Sit to Supine Rolling: Max assist, +2 for physical assistance   Supine to sit: Max assist, HOB elevated Sit to supine: Max assist, HOB elevated   General bed mobility comments: pt moaning with facilitated mvmt, attempting to get him to be active with bed mobility but pt moaning and at times resistive. Rolled to both sides for BM clean up end of session    Transfers Overall transfer level: Needs assistance Equipment used: Ambulation equipment used Transfers: Sit to/from Stand Sit to Stand: Max assist, From elevated surface, +2 physical assistance           General transfer comment: pt grasping rail of stedy but with each attempt to stand pt either not initiating or sometimes actively resisting stand with UE's. Did not achieve full standing today    Ambulation/Gait               General Gait Details: unable   Stairs             Wheelchair Mobility    Modified Rankin (Stroke Patients Only) Modified Rankin (Stroke Patients Only) Pre-Morbid Rankin Score: Moderate disability Modified Rankin: Severe disability     Balance Overall balance assessment: Needs assistance Sitting-balance support: Single extremity supported, Feet supported, No upper extremity supported, Bilateral upper extremity supported Sitting balance-Leahy  Scale:  Poor Sitting balance - Comments: pt with initial posterior lean. Worked on hinging at hips to come to neutral. Then once stedy in front of pt he was leaning fwd resting forehead on bar of stedy Postural control: Posterior lean Standing balance support: Bilateral upper extremity supported Standing balance-Leahy Scale: Zero Standing balance comment: unable to achieve standing                            Cognition Arousal/Alertness: Lethargic Behavior During Therapy: Flat affect Overall Cognitive Status: Impaired/Different from baseline Area of Impairment: Attention, Memory, Following commands, Safety/judgement, Problem solving, Awareness                 Orientation Level: Disoriented to, Time Current Attention Level: Focused Memory: Decreased short-term memory Following Commands: Follows one step commands inconsistently, Follows one step commands with increased time Safety/Judgement: Decreased awareness of safety, Decreased awareness of deficits Awareness: Intellectual Problem Solving: Slow processing, Decreased initiation, Difficulty sequencing, Requires verbal cues, Requires tactile cues General Comments: Pt very lethargic today, opening eyes for brief periods of time and answering questions with delay, often needing to stimulation to regain his attention. Poor command following and initiation today.        Exercises General Exercises - Lower Extremity Heel Slides: AAROM, Both, 10 reps, Supine Other Exercises Other Exercises: static and dynamic sitting EOB >20 min    General Comments General comments (skin integrity, edema, etc.): session limited by lethargy      Pertinent Vitals/Pain Pain Assessment Pain Assessment: Faces Faces Pain Scale: Hurts little more Breathing: normal Negative Vocalization: none Facial Expression: smiling or inexpressive Body Language: relaxed Consolability: no need to console PAINAD Score: 0 Pain Location: L hip Pain  Descriptors / Indicators: Discomfort, Grimacing, Guarding, Moaning Pain Intervention(s): Limited activity within patient's tolerance, Monitored during session    Home Living                          Prior Function            PT Goals (current goals can now be found in the care plan section) Acute Rehab PT Goals Patient Stated Goal: to go home PT Goal Formulation: With patient/family Time For Goal Achievement: 09/05/22 Potential to Achieve Goals: Fair Progress towards PT goals: Not progressing toward goals - comment (lethargy)    Frequency    Min 3X/week      PT Plan Current plan remains appropriate    Co-evaluation              AM-PAC PT "6 Clicks" Mobility   Outcome Measure  Help needed turning from your back to your side while in a flat bed without using bedrails?: A Lot Help needed moving from lying on your back to sitting on the side of a flat bed without using bedrails?: A Lot Help needed moving to and from a bed to a chair (including a wheelchair)?: Total Help needed standing up from a chair using your arms (e.g., wheelchair or bedside chair)?: Total Help needed to walk in hospital room?: Total Help needed climbing 3-5 steps with a railing? : Total 6 Click Score: 8    End of Session Equipment Utilized During Treatment: Gait belt Activity Tolerance: Patient limited by lethargy Patient left: in bed;with call bell/phone within reach;with bed alarm set;with family/visitor present;with SCD's reapplied Nurse Communication: Mobility status;Other (comment) (needs air mattress with wounds noted at buttocks) PT Visit Diagnosis:  Other symptoms and signs involving the nervous system (R29.898);Muscle weakness (generalized) (M62.81);Other abnormalities of gait and mobility (R26.89);Unsteadiness on feet (R26.81);Difficulty in walking, not elsewhere classified (R26.2)     Time: 1610-9604 PT Time Calculation (min) (ACUTE ONLY): 31 min  Charges:  $Therapeutic  Activity: 23-37 mins                     Lyanne Co, PT  Acute Rehab Services Secure chat preferred Office [phone]    Lawana Chambers Toren Tucholski 09/04/2022, 10:32 AM

## 2022-09-04 NOTE — Progress Notes (Signed)
[facility]                                                                               Raymond Sampson, is a 82 y.o. male, DOB - Aug 29, 1940, WUJ:[id number] Admit date - 08/18/2022    Outpatient Primary MD for the patient is Gracelyn Nurse, MD  LOS - 17  days    Brief summary   82 year old M with PMH of CAD, CVA, HTN, HLD, DM-2, cervical spine fusion and OA presented to ED after fall at home and found to have traumatic right frontal SDH measuring 2.2 cm in maximum thickness with 5 to 6 mm midline shift to the left, and trace SAH at the right temporal lobe.  Patient was on Plavix and aspirin.  CT cervical spine without acute finding but some sclerotic changes at C2-C3 facet joint.  He was admitted to ICU by neurosurgery. He was initially minimally symptomatic and refused surgery.  However, he developed intermittent focal seizure.    Repeat CT head on 5/18 with stable SDH and SAH.  MRI brain showed multiple small/punctate areas of restricted diffusion with bilateral frontal and parietal cortex suspicious for tiny acute infarcts or small intraparenchymal contusion and/or shear type injury in the setting of trauma.  EEG on 5/21 showed intermittent seizure.  Neurology consulted.  Patient was started on Keppra and Vimpat.  Palliative medicine consulted as well.   Patient was transferred to hospitalist service on 5/23.  Subsequent LTM EEG without seizure.  Neurology signed off.  Encephalopathy improved but seems to be delirious.  Remains NPO except some pured diet from nursing stock.  He is on tube feed via cortrack.  SLP following.  Therapy recommended SNF. Patient remains very sleepy , SLP unable to assess him due to sleepiness. Repeat CT head without contrast showed unchanged subdural hematoma.   Stopped keppra altogether today. LTM eeg Discontinued.    Assessment & Plan    Assessment and Plan:   Fall at home, left frontal head laceration S/p repair with  stitches. Sutures were removed on 08/27/2022.     Traumatic right subdural hematoma/subarachnoid hemorrhage with midline shift due to fall and antiplatelet therapy Repeat CT shows stable hematoma Neurosurgery recommended no surgical intervention at this time. Aspirin Plavix on hold for 1 week and then resume. Neurosurgery recommended outpatient follow-up. Therapy evaluations recommending SNF    Provoked seizures probably secondary to right subdural hematoma and subarachnoid hemorrhage with midline shift EEG showed seizures on 09/01/2022 Subsequent LTM EEG with evidence of epileptogenicity arising from the right central parietal region.   D/c LTM EEG today.  D/c keppra today and continue with vimpat 100 mg BID.      Acute encephalopathy probably secondary to the subdural hematoma and provoked seizures in the setting of memory deficits Reorientation and delirium precautions Avoid/minimize sedating medications.  Patient remains sleepy , with intermittently being awake and eating some floor stock and pudding. No changes in the last 24 hours.  TSH wnl, ammonia is normal. Repeat CT head showed unchanged sub dural hematoma. UA is negative for infection. CXR is negative for pneumonia.  Unclear why he is soo lethargic. With out much improvement in  mental status.  Unable to do SLP evaluation due to excessive lethargy.  Stopped the keppra to see if his mental status will improve.    Pain back pain with some bruising seen due to fall X-rays are negative for any acute findings Pain control, Lidoderm patch and therapy evaluations.    Acute blood loss anemia no obvious signs of bleeding no significant iron deficiency on anemia panel hemoglobin improved after 2 units of PRBC transfusion. Continue to monitor as needed. Hemoglobin stable around 10.    History of coronary artery disease s/p PCI/DES stent to LCx/OM 2 in January 2024 Continue with Plavix, low-dose aspirin and  Coreg    Non-insulin-dependent diabetes mellitus with hyperglycemia  CBGs are better controlled Continue with sliding scale insulin CBG (last 3)  Recent Labs    09/04/22 0054 09/04/22 0410 09/04/22 0815  GLUCAP 166* 154* 143*    No changes in meds.     Hypertension Blood pressure parameters are well controlled.    History of CVA Continue with aspirin, Plavix and Lipitor  Hypokalemia and hypophosphatemia Replaced  Coughing and congestion: CXR done , no pneumonia or fluid.  Continue with robitussin    Hyponatremia Sodium is stable around 130.    Dysphagia Patient on honey thickened pure from nursing. Speech therapy evaluations recommending tube feeds for nutritional supplementation. Patient is not alert enough in the last 4 days to participate in SLP evaluation.   Mood disorder Continue with home Zoloft.     severe malnutrition Dietary on board  recommendations are to continue with tube feeds and  supplementation at this time   Malnutrition Type:  Nutrition Problem: Severe Malnutrition Etiology: chronic illness (fall)   Malnutrition Characteristics:  Signs/Symptoms: severe fat depletion, severe muscle depletion   Nutrition Interventions:  Interventions: Tube feeding, Prostat, MVI  Estimated body mass index is 22.93 kg/m as calculated from the following:   Height as of this encounter: 5\' 7"  (1.702 m).   Weight as of this encounter: 66.4 kg.  Code Status: DNR DVT Prophylaxis:  enoxaparin (LOVENOX) injection 40 mg Start: 08/28/22 1000 Place and maintain sequential compression device Start: 08/25/22 0839 SCDs Start: 08/18/22 2106   Level of Care: Level of care: Med-Surg Family Communication: Updated patient's family at bedside  Disposition Plan:     Remains inpatient appropriate: still encephalopathic, lethargic, not able to participate in SLP eval.  Procedures:  CT head without contrast.   Consultants:   PCCM.   Antimicrobials:    Anti-infectives (From admission, onward)    None        Medications  Scheduled Meds:  amLODipine  10 mg Per Tube Daily   aspirin  81 mg Per Tube Daily   atorvastatin  80 mg Per Tube Daily   carvedilol  12.5 mg Per Tube BID WC   clopidogrel  75 mg Per Tube Daily   enoxaparin (LOVENOX) injection  40 mg Subcutaneous Q24H   feeding supplement (PROSource TF20)  60 mL Per Tube BID   Gerhardt's butt cream   Topical QID   insulin aspart  0-15 Units Subcutaneous Q4H   insulin aspart  3 Units Subcutaneous Q4H   insulin glargine-yfgn  15 Units Subcutaneous Daily   levETIRAcetam  500 mg Per Tube BID   lidocaine  1 patch Transdermal Q24H   multivitamin with minerals  1 tablet Per Tube Daily   mouth rinse  15 mL Mouth Rinse 4 times per day   sertraline  25 mg Per Tube QPM  Continuous Infusions:  sodium chloride Stopped (08/28/22 1547)   feeding supplement (JEVITY 1.5 CAL/FIBER) 1,000 mL (09/04/22 0132)   lacosamide (VIMPAT) IV Stopped (09/03/22 2206)   PRN Meds:.sodium chloride, acetaminophen (TYLENOL) oral liquid 160 mg/5 mL, [DISCONTINUED] acetaminophen **OR** acetaminophen, clonazepam, guaiFENesin-dextromethorphan, labetalol, [DISCONTINUED] ondansetron **OR** ondansetron (ZOFRAN) IV, mouth rinse    Subjective:   Raymond Sampson was seen and examined today.  Deconditioned and debilitated. Was able to participate with PT today.   Objective:   Vitals:   09/04/22 0051 09/04/22 0409 09/04/22 0500 09/04/22 0816  BP: 136/70 (!) 144/65  (!) 144/62  Pulse: 82 82  72  Resp: 18 16  18   Temp: 98 F (36.7 C) 98.3 F (36.8 C)  98.3 F (36.8 C)  TempSrc:    Oral  SpO2: 95% 97%  96%  Weight:   66.4 kg   Height:        Intake/Output Summary (Last 24 hours) at 09/04/2022 0858 Last data filed at 09/04/2022 0415 Gross per 24 hour  Intake 825 ml  Output 1700 ml  Net -875 ml    Filed Weights   09/02/22 0500 09/03/22 0500 09/04/22 0500  Weight: 66.8 kg 66.6 kg 66.4 kg      Exam General exam: Ill appearing frail elderly gentleman, with cortrak.Marland Kitchen Respiratory system: Clear to auscultation. Respiratory effort normal. Cardiovascular system: S1 & S2 heard, RRR.  Gastrointestinal system: Abdomen is nondistended, soft and nontender.  Central nervous system: lethargic, opens eyes briefly, mumbles and closes eyes.  Extremities: no pedal edema.  Skin: No rashes,  Psychiatry: increased lethargy.        Data Reviewed:  I have personally reviewed following labs and imaging studies   CBC Lab Results  Component Value Date   WBC 13.4 (H) 09/03/2022   RBC 3.31 (L) 09/03/2022   HGB 10.0 (L) 09/03/2022   HCT 31.0 (L) 09/03/2022   MCV 93.7 09/03/2022   MCH 30.2 09/03/2022   PLT 443 (H) 09/03/2022   MCHC 32.3 09/03/2022   RDW 15.5 09/03/2022   LYMPHSABS 2.0 09/03/2022   MONOABS 1.0 09/03/2022   EOSABS 0.4 09/03/2022   BASOSABS 0.0 09/03/2022     Last metabolic panel Lab Results  Component Value Date   NA 131 (L) 09/03/2022   K 4.6 09/03/2022   CL 99 09/03/2022   CO2 25 09/03/2022   BUN 51 (H) 09/03/2022   CREATININE 0.82 09/03/2022   GLUCOSE 136 (H) 09/03/2022   GFRNONAA >60 09/03/2022   GFRAA >60 06/06/2017   CALCIUM 8.5 (L) 09/03/2022   PHOS 3.0 08/30/2022   PROT 6.4 (L) 09/03/2022   ALBUMIN 2.5 (L) 09/03/2022   BILITOT 0.5 09/03/2022   ALKPHOS 147 (H) 09/03/2022   AST 27 09/03/2022   ALT 23 09/03/2022   ANIONGAP 7 09/03/2022    CBG (last 3)  Recent Labs    09/04/22 0054 09/04/22 0410 09/04/22 0815  GLUCAP 166* 154* 143*       Coagulation Profile: No results for input(s): "INR", "PROTIME" in the last 168 hours.   Radiology Studies: DG CHEST PORT 1 VIEW  Result Date: 09/03/2022 CLINICAL DATA:  Cough. EXAM: PORTABLE CHEST 1 VIEW COMPARISON:  07/02/2018 FINDINGS: The heart size and mediastinal contours are within normal limits. Coronary artery stent noted. Feeding tube is seen extending into the stomach. Both lungs are  clear. Two disconnected ports are noted in the right hemithorax. IMPRESSION: No active disease. Electronically Signed   By: Danae Orleans M.D.   On:  09/03/2022 09:54       Kathlen Mody M.D. [facility] 09/04/2022, 8:58 AM  Available via Epic secure chat 7am-7pm After 7 pm, please refer to night coverage provider listed on amion.

## 2022-09-04 NOTE — Progress Notes (Addendum)
LTM EEG discontinued - Multiple areas of skin breakdown at the site of hook-up.  [facility] notified

## 2022-09-04 NOTE — Progress Notes (Signed)
Subjective: Continues to be lethargic, minimally interactive.  Per wife at bedside, he has had slow improvement.  ROS: Unable to obtain due to poor mental status  Examination  Vital signs in last 24 hours: Temp:  [98 F (36.7 C)-98.4 F (36.9 C)] 98.1 F (36.7 C) (06/03 1141) Pulse Rate:  [71-82] 71 (06/03 1141) Resp:  [16-18] 18 (06/03 1141) BP: (132-144)/(62-70) 132/70 (06/03 1141) SpO2:  [94 %-97 %] 96 % (06/03 1141) Weight:  [66.4 kg] 66.4 kg (06/03 0500)  General: lying in bed, NAD Neuro: MS: Lethargic, able to tell me his name but did not answer rest of the orientation questions, did follow simple one-step commands consistently  CN: pupils equal and reactive,  EOMI, face symmetric, tongue midline, normal sensation over face, Motor: Antigravity strength in all 4 extremities  Basic Metabolic Panel: Recent Labs  Lab 08/30/22 0606 08/31/22 1525 09/03/22 0403 09/04/22 0742  NA 132* 132* 131*  --   K 4.2 4.7 4.6  --   CL 98 98 99  --   CO2 26 24 25   --   GLUCOSE 195* 149* 136*  --   BUN 33* 40* 51*  --   CREATININE 0.80 0.80 0.82 0.86  CALCIUM 8.7* 8.6* 8.5*  --   MG 1.8  --   --   --   PHOS 3.0  --   --   --     CBC: Recent Labs  Lab 08/29/22 0229 08/30/22 0606 08/31/22 1525 09/03/22 0403  WBC  --  11.6* 11.6* 13.4*  NEUTROABS  --   --  8.3* 9.9*  HGB 9.6* 10.4* 10.9* 10.0*  HCT 29.4* 32.5* 33.5* 31.0*  MCV  --  91.8 94.4 93.7  PLT  --  396 408* 443*     Coagulation Studies: No results for input(s): "LABPROT", "INR" in the last 72 hours.  Imaging CT head without contrast 09/01/2022: Unchanged size of a 1.6 cm thickness recent right subdural hematoma which is mildly decreased in attenuation. Similar mass effect with trace leftward midline shift. Similar smaller isoattenuating left subdural collection. Remote left occipital infarct.   ASSESSMENT AND PLAN: 82 year old male with right subdural hematoma secondary to fall now with seizures.    Traumatic  subdural hematoma Provoked seizures -Seizures most likely secondary to underlying subdural hematoma   Recommendations -Stop Keppra to minimize sedation -Continue Vimpat 100 mg twice daily, will reduce further if patient continues to be lethargic -Also discussed with wife about starting amantadine for neurostimulation if needed -Discontinue LTM EEG as no seizures -Continue seizure precautions -As needed IV Versed for clinical seizures -Discussed plan with family at bedside and Dr. Blake Divine via secure chat   I have spent a total of  35 minutes with the patient reviewing hospital notes,  test results, labs and examining the patient as well as establishing an assessment and plan.  > 50% of time was spent in direct patient care.      Lindie Spruce Epilepsy [facility] For questions after 5pm please refer to AMION to reach the Neurologist on call

## 2022-09-04 NOTE — Procedures (Addendum)
Patient Name: Raymond Sampson  MRN: [id number]  Epilepsy Attending: Charlsie Quest  Referring Physician/Provider: Jefferson Fuel, MD  Duration: 09/03/2019 1715 to 09/04/2022 1049   Patient history: 82 y.o. male with history of CAD, stroke, diabetes, hyperlipidemia, hypertension and paroxysmal atrial tachycardia who presented originally after falling off of 1 step off his deck at home and having a head injury with a large right-sided subdural hematoma.  He was initially alert and oriented and mentally at his baseline, but yesterday around 1300, he became more drowsy and was only able to intermittently follow commands and intermittently answer questions. EEG to evaluate for seizure.   Level of alertness: Awake, asleep   AEDs during EEG study: LEV, LCM   Technical aspects: This EEG study was done with scalp electrodes positioned according to the 10-20 International system of electrode placement. Electrical activity was reviewed with band pass filter of 1-70Hz , sensitivity of 7 uV/mm, display speed of 72mm/sec with a 60Hz  notched filter applied as appropriate. EEG data were recorded continuously and digitally stored.  Video monitoring was available and reviewed as appropriate.   Description: The posterior dominant rhythm consists of 8-9 Hz activity of moderate voltage (25-35 uV) seen predominantly in posterior head regions, symmetric and reactive to eye opening and eye closing. Sleep was characterized by vertex waves, sleep spindles (12 to 14 Hz), maximal frontocentral region.  EEG showed intermittent generalized and lateralized right hemisphere 3-7 theta-delta slowing. Hyperventilation and photic stimulation were not performed.      ABNORMALITY - Intermittent slow, generalized and lateralized right hemisphere   IMPRESSION: This study is suggestive of cortical dysfunction in right hemisphere likely secondary to underlying structural abnormality. Additionally there is mild diffuse encephalopathy. No  seizures or definite epileptiform discharges were noted.   Raymond Sampson Annabelle Harman

## 2022-09-04 NOTE — Progress Notes (Signed)
Daily Progress Note   Patient Name: Raymond Sampson       Date: 09/04/2022 DOB: 10-12-1940  Age: 82 y.o. MRN#: [id number] Attending Physician: Kathlen Mody, MD Primary Care Physician: Gracelyn Nurse, MD Admit Date: 08/18/2022  Reason for Consultation/Follow-up: Establishing goals of care  Subjective: Patient was awake and responded "Hello" when I greeted him. SLP at finishing working with him. Wife at bedside.  Length of Stay: 17  Current Medications: Scheduled Meds:   amLODipine  10 mg Per Tube Daily   aspirin  81 mg Per Tube Daily   atorvastatin  80 mg Per Tube Daily   carvedilol  12.5 mg Per Tube BID WC   clopidogrel  75 mg Per Tube Daily   enoxaparin (LOVENOX) injection  40 mg Subcutaneous Q24H   feeding supplement (PROSource TF20)  60 mL Per Tube BID   Gerhardt's butt cream   Topical QID   insulin aspart  0-15 Units Subcutaneous Q4H   insulin aspart  3 Units Subcutaneous Q4H   insulin glargine-yfgn  15 Units Subcutaneous Daily   lidocaine  1 patch Transdermal Q24H   multivitamin with minerals  1 tablet Per Tube Daily   mouth rinse  15 mL Mouth Rinse 4 times per day   sertraline  25 mg Per Tube QPM    Continuous Infusions:  sodium chloride Stopped (08/28/22 1547)   feeding supplement (JEVITY 1.5 CAL/FIBER) 1,000 mL (09/04/22 0132)   lacosamide (VIMPAT) IV 100 mg (09/04/22 1049)    PRN Meds: sodium chloride, acetaminophen (TYLENOL) oral liquid 160 mg/5 mL, [DISCONTINUED] acetaminophen **OR** acetaminophen, clonazepam, guaiFENesin-dextromethorphan, labetalol, [DISCONTINUED] ondansetron **OR** ondansetron (ZOFRAN) IV, mouth rinse  Physical Exam Vitals reviewed.  Constitutional:      General: He is sleeping.     Appearance: He is ill-appearing.  HENT:     Head:      Comments: EEG Pulmonary:     Effort: Pulmonary effort is normal.  Skin:    General: Skin is warm and dry.             Vital Signs: BP (!) 144/62 (BP Location: Left Arm)   Pulse 72   Temp 98.3 F (36.8 C) (Oral)   Resp 18   Ht 5\' 7"  (1.702 m)   Wt 66.4 kg   SpO2 96%  BMI 22.93 kg/m  SpO2: SpO2: 96 % O2 Device: O2 Device: Room Air O2 Flow Rate: O2 Flow Rate (L/min): 2 L/min  Intake/output summary:  Intake/Output Summary (Last 24 hours) at 09/04/2022 1056 Last data filed at 09/04/2022 0415 Gross per 24 hour  Intake 590 ml  Output 1700 ml  Net -1110 ml   LBM: Last BM Date : 09/02/22 Baseline Weight: Weight: 65.2 kg Most recent weight: Weight: 66.4 kg       Palliative Assessment/Data: 20      Patient Active Problem List   Diagnosis Date Noted   Protein-calorie malnutrition, severe 08/24/2022   Provoked seizures (HCC) 08/23/2022   Encephalopathy acute 08/23/2022   DNR (do not resuscitate) 08/23/2022   Traumatic subdural hematoma without loss of consciousness (HCC) 08/18/2022   Fall 06/22/2022   Malnutrition of moderate degree 06/22/2022   Acute blood loss anemia 06/21/2022   Multiple rib fractures involving four or more ribs, right side 06/20/2022   Intramuscular hematoma 06/19/2022   Rib fractures 06/19/2022   Hemothorax on right 06/19/2022   Leukocytosis 06/19/2022   Status post coronary artery stent placement 05/02/2022   Mild dementia (HCC) 04/14/2022   Non-ST elevation (NSTEMI) myocardial infarction (HCC) 04/13/2022   History of COVID-19 04/13/2022   Lung nodule 04/13/2022   PAD (peripheral artery disease) (HCC) 04/13/2022   Flu 06/05/2017   Memory loss, short term 02/07/2016   History of CVA (cerebrovascular accident) 02/07/2016   Diabetes mellitus type 2, uncomplicated (HCC) 04/06/2015   Carotid stenosis 05/14/2013   CAROTID BRUIT, LEFT 04/25/2010   PALPITATIONS 06/18/2009   Hyperlipidemia 04/27/2009   Essential hypertension 04/27/2009   Ischemic  heart disease 04/27/2009    Palliative Care Assessment & Plan   Patient Profile: 82 y.o. male with past medical history of CAD, stroke, DM2, HTN, HLD admitted on 08/18/2022 after a fall at his home with a large right-sided subdural hematoma. He was initially alert and oriented but on 08/21/22 he he became more drowsy and was only able to intermittently follow commands and intermittently answer questions. EEG to evaluate for seizure.   Assessment: Patient was in between his eyes open and responding and eyes closed and sleeping. He was oriented to person and place. He was finishing up with SLP. We continued the discussion on nutrition. The patients wife was going to continue to encourage him with honey thick liquids when he is alert.  Recommendations/Plan: DNR Allow time for outcomes Encourage family to continue conversations around GOC  Goals of Care and Additional Recommendations: Limitations on Scope of Treatment: Use MOST form for guidance  Code Status:    Code Status Orders  (From admission, onward)           Start     Ordered   08/19/22 0022  Do not attempt resuscitation (DNR)  Continuous       Question Answer Comment  If patient has no pulse and is not breathing Do Not Attempt Resuscitation   If patient has a pulse and/or is breathing: Medical Treatment Goals COMFORT MEASURES: Keep clean/warm/dry, use medication by any route; positioning, wound care and other measures to relieve pain/suffering; use oxygen, suction/manual treatment of airway obstruction for comfort; do not transfer unless for comfort needs.   Consent: Discussion documented in EHR or advanced directives reviewed      08/19/22 0023           Code Status History     Date Active Date Inactive Code Status Order ID Comments User Context  08/18/2022 2105 08/19/2022 0023 Full Code [id number]  Iran Sizer, PA-C ED   06/28/2022 1422 07/03/2022 2122 DNR [id number]  Morton Stall, NP Inpatient   06/19/2022  1721 06/28/2022 1422 Full Code [id number]  Floydene Flock, MD ED   05/02/2022 1135 05/03/2022 1523 Full Code [id number]  Yvonne Kendall, MD Inpatient   04/13/2022 0048 04/15/2022 2115 Full Code [id number]  Nolberto Hanlon, MD ED   06/05/2017 2015 06/07/2017 1757 Full Code [id number]  Ihor Austin, MD Inpatient       Prognosis:  Unable to determine  Discharge Planning: To Be Determined    Thank you for allowing the Palliative Medicine Team to assist in the care of this patient.  Time spent: 25 minutes   Detailed review of medical records ( labs, imaging, vital signs), medically appropriate exam, counseling and education to care partner, documenting clinical information, medication management, coordination of care.    Sherryll Burger, NP  Please contact Palliative Medicine Team phone at [phone] for questions and concerns.

## 2022-09-05 DIAGNOSIS — Z515 Encounter for palliative care: Secondary | ICD-10-CM | POA: Diagnosis not present

## 2022-09-05 DIAGNOSIS — S065XAA Traumatic subdural hemorrhage with loss of consciousness status unknown, initial encounter: Secondary | ICD-10-CM | POA: Diagnosis not present

## 2022-09-05 DIAGNOSIS — E43 Unspecified severe protein-calorie malnutrition: Secondary | ICD-10-CM | POA: Diagnosis not present

## 2022-09-05 DIAGNOSIS — Z7189 Other specified counseling: Secondary | ICD-10-CM | POA: Diagnosis not present

## 2022-09-05 DIAGNOSIS — R569 Unspecified convulsions: Secondary | ICD-10-CM | POA: Diagnosis not present

## 2022-09-05 LAB — CBC WITH DIFFERENTIAL/PLATELET
Abs Immature Granulocytes: 0.04 10*3/uL (ref 0.00–0.07)
Basophils Absolute: 0 10*3/uL (ref 0.0–0.1)
Basophils Relative: 0 %
Eosinophils Absolute: 0.5 10*3/uL (ref 0.0–0.5)
Eosinophils Relative: 4 %
HCT: 31.4 % — ABNORMAL LOW (ref 39.0–52.0)
Hemoglobin: 10.2 g/dL — ABNORMAL LOW (ref 13.0–17.0)
Immature Granulocytes: 0 %
Lymphocytes Relative: 13 %
Lymphs Abs: 1.6 10*3/uL (ref 0.7–4.0)
MCH: 30.5 pg (ref 26.0–34.0)
MCHC: 32.5 g/dL (ref 30.0–36.0)
MCV: 94 fL (ref 80.0–100.0)
Monocytes Absolute: 1 10*3/uL (ref 0.1–1.0)
Monocytes Relative: 8 %
Neutro Abs: 9.2 10*3/uL — ABNORMAL HIGH (ref 1.7–7.7)
Neutrophils Relative %: 75 %
Platelets: 443 10*3/uL — ABNORMAL HIGH (ref 150–400)
RBC: 3.34 MIL/uL — ABNORMAL LOW (ref 4.22–5.81)
RDW: 15.3 % (ref 11.5–15.5)
WBC: 12.4 10*3/uL — ABNORMAL HIGH (ref 4.0–10.5)
nRBC: 0 % (ref 0.0–0.2)

## 2022-09-05 LAB — BASIC METABOLIC PANEL
Anion gap: 10 (ref 5–15)
BUN: 47 mg/dL — ABNORMAL HIGH (ref 8–23)
CO2: 24 mmol/L (ref 22–32)
Calcium: 8.7 mg/dL — ABNORMAL LOW (ref 8.9–10.3)
Chloride: 101 mmol/L (ref 98–111)
Creatinine, Ser: 0.86 mg/dL (ref 0.61–1.24)
GFR, Estimated: >60 mL/min (ref >60)
Glucose, Bld: 164 mg/dL — ABNORMAL HIGH (ref 70–99)
Potassium: 4.4 mmol/L (ref 3.5–5.1)
Sodium: 135 mmol/L (ref 135–145)

## 2022-09-05 LAB — GLUCOSE, CAPILLARY
Glucose-Capillary: 126 mg/dL — ABNORMAL HIGH (ref 70–99)
Glucose-Capillary: 137 mg/dL — ABNORMAL HIGH (ref 70–99)
Glucose-Capillary: 150 mg/dL — ABNORMAL HIGH (ref 70–99)
Glucose-Capillary: 154 mg/dL — ABNORMAL HIGH (ref 70–99)
Glucose-Capillary: 156 mg/dL — ABNORMAL HIGH (ref 70–99)
Glucose-Capillary: 194 mg/dL — ABNORMAL HIGH (ref 70–99)
Glucose-Capillary: 241 mg/dL — ABNORMAL HIGH (ref 70–99)

## 2022-09-05 MED ORDER — SODIUM CHLORIDE 0.9 % IV SOLN
50.0000 mg | Freq: Two times a day (BID) | INTRAVENOUS | Status: DC
  Administered 2022-09-05 – 2022-09-10 (×10): 50 mg via INTRAVENOUS
  Filled 2022-09-05 (×11): qty 5

## 2022-09-05 NOTE — Progress Notes (Signed)
[facility]                                                                               Raymond Sampson, is a 82 y.o. male, DOB - [birth year], YQM:[id number] Admit date - 08/18/2022    Outpatient Primary MD for the patient is Gracelyn Nurse, MD  LOS - 18  days    Brief summary   82 year old M with PMH of CAD, CVA, HTN, HLD, DM-2, cervical spine fusion and OA presented to ED after fall at home and found to have traumatic right frontal SDH measuring 2.2 cm in maximum thickness with 5 to 6 mm midline shift to the left, and trace SAH at the right temporal lobe.  Patient was on Plavix and aspirin.  CT cervical spine without acute finding but some sclerotic changes at C2-C3 facet joint.  He was admitted to ICU by neurosurgery. He was initially minimally symptomatic and refused surgery.  However, he developed intermittent focal seizure.    Repeat CT head on 5/18 with stable SDH and SAH.  MRI brain showed multiple small/punctate areas of restricted diffusion with bilateral frontal and parietal cortex suspicious for tiny acute infarcts or small intraparenchymal contusion and/or shear type injury in the setting of trauma.  EEG on 5/21 showed intermittent seizure.  Neurology consulted.  Patient was started on Keppra and Vimpat.  Palliative medicine consulted as well.   Patient was transferred to hospitalist service on 5/23.  Subsequent LTM EEG without seizure.  Neurology signed off.  Encephalopathy improved but seems to be delirious.  Remains NPO except some pured diet from nursing stock.  He is on tube feed via cortrack.  SLP following.  Therapy recommended SNF. Patient remains very sleepy and lethargic, SLP unable to assess him due to sleepiness. Repeat CT head without contrast showed unchanged subdural hematoma.   Stopped keppra altogether today. LTM eeg Discontinued.    Assessment & Plan    Assessment and Plan:   Fall at home, left frontal head laceration S/p repair with  stitches. Sutures were removed on 08/27/2022.     Traumatic right subdural hematoma/subarachnoid hemorrhage with midline shift due to fall and antiplatelet therapy Repeat CT shows stable hematoma Neurosurgery recommended no surgical intervention at this time. Aspirin Plavix on hold for 1 week and then resume. Neurosurgery recommended outpatient follow-up. Therapy evaluations recommending SNF, but patient still has cortrak and remains NPO due to excessive lethargy.     Provoked seizures probably secondary to right subdural hematoma and subarachnoid hemorrhage with midline shift EEG showed seizures on 09/01/2022 Subsequent LTM EEG with evidence of epileptogenicity arising from the right central parietal region.   Due to his increased lethargy, neurology re consulted and he was on LTM EEG, which did not show any seizure activity.  Neurology decreased the dose of keppra initially, finally discontinued it to minimize sedation.  His vimpat was also decreased to 50 mg BID today.  He will be at risk for more seizures.  Continue to monitor.      Acute encephalopathy probably secondary to the subdural hematoma and provoked seizures in the setting of memory deficits Avoid/minimize sedating medications.  Patient remains sleepy ,  with intermittently being awake and eating some floor stock and pudding. No changes in the last 24 hours.  Work up so far has been negative.  TSH wnl, ammonia is normal. Repeat CT head showed unchanged sub dural hematoma. UA is negative for infection. CXR is negative for pneumonia.  Unable to do SLP evaluation due to excessive lethargy.  Due to his increased lethargy, neurology re consulted and he was on LTM EEG, which did not show any seizure activity.  Neurology decreased the dose of keppra initially, finally discontinued it to minimize sedation.  His vimpat was also decreased to 50 mg BID today.  He will be at risk for more seizures.     Pain back pain with some  bruising seen due to fall X-rays are negative for any acute findings Pain control, Lidoderm patch and therapy evaluations.    Acute blood loss anemia  no obvious signs of bleeding  no significant iron deficiency on anemia panel hemoglobin improved after 2 units of PRBC transfusion. Continue to monitor as needed. Hemoglobin stable around 10.    History of coronary artery disease s/p PCI/DES stent to LCx/OM 2 in January 2024 Continue with Plavix, low-dose aspirin and Coreg    Non-insulin-dependent diabetes mellitus with hyperglycemia  CBGs are better controlled Continue with sliding scale insulin CBG (last 3)  Recent Labs    09/05/22 0314 09/05/22 0836 09/05/22 1153  GLUCAP 126* 194* 241*    No changes in meds.     Hypertension Blood pressure parameters are well controlled.    History of CVA Continue with aspirin, Plavix and Lipitor  Hypokalemia and hypophosphatemia Replaced  Coughing and congestion: CXR done , no pneumonia or fluid.  Continue with robitussin    Hyponatremia Resolved.   Dysphagia Patient on honey thickened pure from nursing. Speech therapy evaluations recommending tube feeds for nutritional supplementation. Patient is not alert enough to participate in SLP evaluation.  Palliative is on RA for GOC.  Family decided no PEG tube placement at this time.     Mood disorder Continue with home Zoloft.     severe malnutrition Dietary on board  recommendations are to continue with tube feeds and  supplementation at this time   Malnutrition Type:  Nutrition Problem: Severe Malnutrition Etiology: chronic illness (fall)   Malnutrition Characteristics:  Signs/Symptoms: severe fat depletion, severe muscle depletion   Nutrition Interventions:  Interventions: Tube feeding, Prostat, MVI  Estimated body mass index is 22.75 kg/m as calculated from the following:   Height as of this encounter: 5\' 7"  (1.702 m).   Weight as of this  encounter: 65.9 kg.  Code Status: DNR DVT Prophylaxis:  enoxaparin (LOVENOX) injection 40 mg Start: 08/28/22 1000 Place and maintain sequential compression device Start: 08/25/22 0839 SCDs Start: 08/18/22 2106   Level of Care: Level of care: Med-Surg Family Communication: Updated patient's family at bedside  Disposition Plan:     Remains inpatient appropriate: still lethargic, not able to participate in SLP eval.  Procedures:  CT head without contrast.   Consultants:   PCCM.   Antimicrobials:   Anti-infectives (From admission, onward)    None        Medications  Scheduled Meds:  amLODipine  10 mg Per Tube Daily   aspirin  81 mg Per Tube Daily   atorvastatin  80 mg Per Tube Daily   carvedilol  12.5 mg Per Tube BID WC   clopidogrel  75 mg Per Tube Daily   enoxaparin (LOVENOX) injection  40  mg Subcutaneous Q24H   feeding supplement (PROSource TF20)  60 mL Per Tube BID   Gerhardt's butt cream   Topical QID   insulin aspart  0-15 Units Subcutaneous Q4H   insulin aspart  3 Units Subcutaneous Q4H   insulin glargine-yfgn  15 Units Subcutaneous Daily   lidocaine  1 patch Transdermal Q24H   multivitamin with minerals  1 tablet Per Tube Daily   mouth rinse  15 mL Mouth Rinse 4 times per day   sertraline  25 mg Per Tube QPM   Continuous Infusions:  sodium chloride Stopped (08/28/22 1547)   feeding supplement (JEVITY 1.5 CAL/FIBER) 1,000 mL (09/04/22 0132)   lacosamide (VIMPAT) IV     PRN Meds:.sodium chloride, acetaminophen (TYLENOL) oral liquid 160 mg/5 mL, [DISCONTINUED] acetaminophen **OR** acetaminophen, clonazepam, guaiFENesin-dextromethorphan, labetalol, [DISCONTINUED] ondansetron **OR** ondansetron (ZOFRAN) IV, mouth rinse    Subjective:   Raymond Sampson was seen and examined today.  Remains lethargic. Not much change from yesterday.   Objective:   Vitals:   09/05/22 0300 09/05/22 0405 09/05/22 0837 09/05/22 1155  BP: 138/66  135/65 111/80  Pulse: 76  80  74  Resp: 17  18 18   Temp: (!) 97.4 F (36.3 C)  97.6 F (36.4 C) (!) 97.4 F (36.3 C)  TempSrc: Oral  Oral Oral  SpO2: 96%  98% 96%  Weight:  65.9 kg    Height:        Intake/Output Summary (Last 24 hours) at 09/05/2022 1359 Last data filed at 09/05/2022 1200 Gross per 24 hour  Intake --  Output 1150 ml  Net -1150 ml    Filed Weights   09/03/22 0500 09/04/22 0500 09/05/22 0405  Weight: 66.6 kg 66.4 kg 65.9 kg     Exam General exam: ill appearing elderly frail gentleman, not in distress.  Respiratory system: Clear to auscultation. Respiratory effort normal. Cardiovascular system: S1 & S2 heard, RRR. No JVD, murmurs,  Gastrointestinal system: Abdomen is nondistended, soft and nontender.  Central nervous system: lethargic, only able to recognize his wife and say a few words when he is more awake.  Extremities: no edema.  Skin: No rashes,  Psychiatry: unable to assess due to lethargy.        Data Reviewed:  I have personally reviewed following labs and imaging studies   CBC Lab Results  Component Value Date   WBC 12.4 (H) 09/05/2022   RBC 3.34 (L) 09/05/2022   HGB 10.2 (L) 09/05/2022   HCT 31.4 (L) 09/05/2022   MCV 94.0 09/05/2022   MCH 30.5 09/05/2022   PLT 443 (H) 09/05/2022   MCHC 32.5 09/05/2022   RDW 15.3 09/05/2022   LYMPHSABS 1.6 09/05/2022   MONOABS 1.0 09/05/2022   EOSABS 0.5 09/05/2022   BASOSABS 0.0 09/05/2022     Last metabolic panel Lab Results  Component Value Date   NA 135 09/05/2022   K 4.4 09/05/2022   CL 101 09/05/2022   CO2 24 09/05/2022   BUN 47 (H) 09/05/2022   CREATININE 0.86 09/05/2022   GLUCOSE 164 (H) 09/05/2022   GFRNONAA >60 09/05/2022   GFRAA >60 06/06/2017   CALCIUM 8.7 (L) 09/05/2022   PHOS 3.0 08/30/2022   PROT 6.4 (L) 09/03/2022   ALBUMIN 2.5 (L) 09/03/2022   BILITOT 0.5 09/03/2022   ALKPHOS 147 (H) 09/03/2022   AST 27 09/03/2022   ALT 23 09/03/2022   ANIONGAP 10 09/05/2022    CBG (last 3)  Recent Labs     09/05/22 0314  09/05/22 0836 09/05/22 1153  GLUCAP 126* 194* 241*       Coagulation Profile: No results for input(s): "INR", "PROTIME" in the last 168 hours.   Radiology Studies: No results found.     Kathlen Mody M.D. [facility] 09/05/2022, 1:59 PM  Available via Epic secure chat 7am-7pm After 7 pm, please refer to night coverage provider listed on amion.

## 2022-09-05 NOTE — Progress Notes (Signed)
Daily Progress Note   Patient Name: Raymond Sampson       Date: 09/05/2022 DOB: [birth year]  Age: 82 y.o. MRN#: [id number] Attending Physician: Kathlen Mody, MD Primary Care Physician: Gracelyn Nurse, MD Admit Date: 08/18/2022  Reason for Consultation/Follow-up: Establishing goals of care  Subjective: Patient is alert in bed sitting upright. He says he wants to get out of bed.  Length of Stay: 18  Current Medications: Scheduled Meds:   amLODipine  10 mg Per Tube Daily   aspirin  81 mg Per Tube Daily   atorvastatin  80 mg Per Tube Daily   carvedilol  12.5 mg Per Tube BID WC   clopidogrel  75 mg Per Tube Daily   enoxaparin (LOVENOX) injection  40 mg Subcutaneous Q24H   feeding supplement (PROSource TF20)  60 mL Per Tube BID   Gerhardt's butt cream   Topical QID   insulin aspart  0-15 Units Subcutaneous Q4H   insulin aspart  3 Units Subcutaneous Q4H   insulin glargine-yfgn  15 Units Subcutaneous Daily   lidocaine  1 patch Transdermal Q24H   multivitamin with minerals  1 tablet Per Tube Daily   mouth rinse  15 mL Mouth Rinse 4 times per day   sertraline  25 mg Per Tube QPM    Continuous Infusions:  sodium chloride Stopped (08/28/22 1547)   feeding supplement (JEVITY 1.5 CAL/FIBER) 1,000 mL (09/04/22 0132)   lacosamide (VIMPAT) IV      PRN Meds: sodium chloride, acetaminophen (TYLENOL) oral liquid 160 mg/5 mL, [DISCONTINUED] acetaminophen **OR** acetaminophen, clonazepam, guaiFENesin-dextromethorphan, labetalol, [DISCONTINUED] ondansetron **OR** ondansetron (ZOFRAN) IV, mouth rinse  Physical Exam Vitals reviewed.  Constitutional:      Appearance: He is ill-appearing.  HENT:     Head:     Comments: Bruising left eye    Mouth/Throat:     Mouth: Mucous membranes are moist.   Cardiovascular:     Rate and Rhythm: Normal rate.  Pulmonary:     Effort: Pulmonary effort is normal.  Skin:    General: Skin is warm and dry.  Neurological:     Mental Status: He is alert.             Vital Signs: BP 111/80 (BP Location: Left Arm)   Pulse 74   Temp (!) 97.4 F (  36.3 C) (Oral)   Resp 18   Ht 5\' 7"  (1.702 m)   Wt 65.9 kg   SpO2 96%   BMI 22.75 kg/m  SpO2: SpO2: 96 % O2 Device: O2 Device: Room Air O2 Flow Rate: O2 Flow Rate (L/min): 2 L/min  Intake/output summary:  Intake/Output Summary (Last 24 hours) at 09/05/2022 1208 Last data filed at 09/05/2022 0300 Gross per 24 hour  Intake --  Output 850 ml  Net -850 ml   LBM: Last BM Date : 09/05/22 Baseline Weight: Weight: 65.2 kg Most recent weight: Weight: 65.9 kg       Palliative Assessment/Data: 20%      Patient Active Problem List   Diagnosis Date Noted   Protein-calorie malnutrition, severe 08/24/2022   Provoked seizures (HCC) 08/23/2022   Encephalopathy acute 08/23/2022   DNR (do not resuscitate) 08/23/2022   Traumatic subdural hematoma without loss of consciousness (HCC) 08/18/2022   Fall 06/22/2022   Malnutrition of moderate degree 06/22/2022   Acute blood loss anemia 06/21/2022   Multiple rib fractures involving four or more ribs, right side 06/20/2022   Intramuscular hematoma 06/19/2022   Rib fractures 06/19/2022   Hemothorax on right 06/19/2022   Leukocytosis 06/19/2022   Status post coronary artery stent placement 05/02/2022   Mild dementia (HCC) 04/14/2022   Non-ST elevation (NSTEMI) myocardial infarction (HCC) 04/13/2022   History of COVID-19 04/13/2022   Lung nodule 04/13/2022   PAD (peripheral artery disease) (HCC) 04/13/2022   Flu 06/05/2017   Memory loss, short term 02/07/2016   History of CVA (cerebrovascular accident) 02/07/2016   Diabetes mellitus type 2, uncomplicated (HCC) 04/06/2015   Carotid stenosis 05/14/2013   CAROTID BRUIT, LEFT 04/25/2010   PALPITATIONS  06/18/2009   Hyperlipidemia 04/27/2009   Essential hypertension 04/27/2009   Ischemic heart disease 04/27/2009    Palliative Care Assessment & Plan   Patient Profile: 82 y.o. male with past medical history of CAD, stroke, DM2, HTN, HLD admitted on 08/18/2022 after a fall at his home with a large right-sided subdural hematoma. He was initially alert and oriented but on 08/21/22 he he became more drowsy and was only able to intermittently follow commands and intermittently answer questions. EEG to evaluate for seizure.   Assessment: Patient is alert when I saw him. He greeted me and told his wife that he wants to get out of the bed. He was able to eat some applesauce, juice, and coffee with speech today. We discussed neurology's plan to decrease medications that may be contributing to his ongoing lethargy in hopes he will be able to participate in therapy and eat. Encouraged patient's wife to reach out to PMT with concerns or questions.  Recommendations/Plan: DNR Allow time for outcomes Encourage family to continue conversations around [facility]  Goals of Care and Additional Recommendations: Limitations on Scope of Treatment: Use MOST form for guidance.  Code Status:    Code Status Orders  (From admission, onward)           Start     Ordered   08/19/22 0022  Do not attempt resuscitation (DNR)  Continuous       Question Answer Comment  If patient has no pulse and is not breathing Do Not Attempt Resuscitation   If patient has a pulse and/or is breathing: Medical Treatment Goals COMFORT MEASURES: Keep clean/warm/dry, use medication by any route; positioning, wound care and other measures to relieve pain/suffering; use oxygen, suction/manual treatment of airway obstruction for comfort; do not transfer unless for  comfort needs.   Consent: Discussion documented in EHR or advanced directives reviewed      08/19/22 0023           Code Status History     Date Active Date Inactive Code  Status Order ID Comments User Context   08/18/2022 2105 08/19/2022 0023 Full Code [id number]  Vonna Drafts ED   06/28/2022 1422 07/03/2022 2122 DNR [id number]  Morton Stall, NP Inpatient   06/19/2022 1721 06/28/2022 1422 Full Code [id number]  Floydene Flock, MD ED   05/02/2022 1135 05/03/2022 1523 Full Code [id number]  Yvonne Kendall, MD Inpatient   04/13/2022 0048 04/15/2022 2115 Full Code [id number]  Nolberto Hanlon, MD ED   06/05/2017 2015 06/07/2017 1757 Full Code [id number]  Ihor Austin, MD Inpatient       Prognosis:  Unable to determine  Discharge Planning: To Be Determined    Thank you for allowing the Palliative Medicine Team to assist in the care of this patient.  Time spent: 25 minutes   Detailed review of medical records ( labs, imaging, vital signs), medically appropriate exam, counseling and education to care partner, documenting clinical information, medication management, coordination of care.    Sherryll Burger, NP  Please contact Palliative Medicine Team phone at [phone] for questions and concerns.

## 2022-09-05 NOTE — Progress Notes (Signed)
Occupational Therapy Treatment Patient Details Name: Raymond Sampson MRN: [id number] DOB: 1940/10/28 Today's Date: 09/05/2022   History of present illness 82 year old male admitted 08/18/22 after fall at home. CT head 5/18 revealed: large Right Subdural Hematoma, most pronounced along the anterior right frontal convexity.  Developed increased lethargy and change in pupilary size on 5/16 and MRI sowed multiple punctate areas of restricted diffusion in bilateral frontal and parietal cortex suspicious for tiny acute infarcts. LTM EEG positive for nonconvulsive seizures. Pt with history of CAD, prior stroke, hypertension, hyperlipidemia, type 2 diabetes, prior fall in March this year with SNF stay, chronic antiplatelet use with aspirin and Plavix.   OT comments  Patient remains limited today by lethargy.  Rolling in bed with max assist +2, repositioned into chair position and required maximal stimulation to maintain brief moments of alertness.  He is able to follow some simple 1 step commands but demonstrates difficulty with ADL tasks (washing face, applying lotion) due to lethargy and poor sustained attention to task.  He is able to read clock correctly. Will follow acutely, goals remain appropriate but has not met them (will reassess next session).    Recommendations for follow up therapy are one component of a multi-disciplinary discharge planning process, led by the attending physician.  Recommendations may be updated based on patient status, additional functional criteria and insurance authorization.    Assistance Recommended at Discharge Frequent or constant Supervision/Assistance  Patient can return home with the following  Two people to help with walking and/or transfers;Two people to help with bathing/dressing/bathroom;Help with stairs or ramp for entrance;Assist for transportation;Direct supervision/assist for financial management;Assistance with feeding;Direct supervision/assist for medications  management;Assistance with cooking/housework   Equipment Recommendations  Other (comment) (defer)    Recommendations for Other Services      Precautions / Restrictions Precautions Precautions: Fall Precaution Comments: Seizures; cortrak Restrictions Weight Bearing Restrictions: No       Mobility Bed Mobility Overal bed mobility: Needs Assistance Bed Mobility: Rolling Rolling: Max assist, +2 for physical assistance, +2 for safety/equipment         General bed mobility comments: assisted NT with bed linen change and +2 total assist to scoot up in bed    Transfers                   General transfer comment: deferred     Balance                                           ADL either performed or assessed with clinical judgement   ADL Overall ADL's : Needs assistance/impaired     Grooming: Wash/dry hands;Wash/dry face;Maximal assistance;Bed level Grooming Details (indicate cue type and reason): chair position in bed with max assist due to lethargy and task attention         Upper Body Dressing : Maximal assistance;Sitting Upper Body Dressing Details (indicate cue type and reason): upright in bed with max assist to don new gown                        Extremity/Trunk Assessment              Vision       Perception     Praxis      Cognition Arousal/Alertness: Lethargic Behavior During Therapy: Flat affect Overall Cognitive Status: Impaired/Different from baseline Area  of Impairment: Attention, Memory, Following commands, Safety/judgement, Problem solving, Awareness                   Current Attention Level: Focused Memory: Decreased short-term memory Following Commands: Follows one step commands inconsistently, Follows one step commands with increased time Safety/Judgement: Decreased awareness of safety, Decreased awareness of deficits Awareness: Intellectual Problem Solving: Slow processing, Decreased  initiation, Difficulty sequencing, Requires verbal cues, Requires tactile cues General Comments: pt remains lethargic today, opens eyes briefly given maximal stimulation. He is able to read the clock, follow some simple commands but limited due to poor attention and lethargy        Exercises Other Exercises Other Exercises: Bil hand squeeze with ball x 3-5 reps    Shoulder Instructions       General Comments session limited by lethargy    Pertinent Vitals/ Pain       Pain Assessment Pain Assessment: Faces Faces Pain Scale: No hurt Pain Intervention(s): Monitored during session  Home Living                                          Prior Functioning/Environment              Frequency  Min 2X/week        Progress Toward Goals  OT Goals(current goals can now be found in the care plan section)  Progress towards OT goals: Not progressing toward goals - comment;OT to reassess next treatment (lethargy)  Acute Rehab OT Goals Patient Stated Goal: none stated Time For Goal Achievement: 09/19/22 Potential to Achieve Goals: Fair  Plan Discharge plan remains appropriate;Frequency remains appropriate    Co-evaluation                 AM-PAC OT "6 Clicks" Daily Activity     Outcome Measure   Help from another person eating meals?: Total Help from another person taking care of personal grooming?: Total Help from another person toileting, which includes using toliet, bedpan, or urinal?: Total Help from another person bathing (including washing, rinsing, drying)?: Total Help from another person to put on and taking off regular upper body clothing?: Total Help from another person to put on and taking off regular lower body clothing?: Total 6 Click Score: 6    End of Session    OT Visit Diagnosis: Other abnormalities of gait and mobility (R26.89);Muscle weakness (generalized) (M62.81);Other symptoms and signs involving cognitive function;Other  symptoms and signs involving the nervous system (R29.898)   Activity Tolerance Patient limited by lethargy   Patient Left in bed;with call bell/phone within reach;with bed alarm set;with restraints reapplied;with SCD's reapplied   Nurse Communication Mobility status        Time: 1217-1232 OT Time Calculation (min): 15 min  Charges: OT General Charges $OT Visit: 1 Visit OT Treatments $Self Care/Home Management : 8-22 mins  Barry Brunner, OT Acute Rehabilitation Services Office [phone]   Raymond Sampson 09/05/2022, 12:56 PM

## 2022-09-05 NOTE — Progress Notes (Signed)
Speech Language Pathology Treatment: Dysphagia  Patient Details Name: Raymond Sampson MRN: [id number] DOB: Mar 01, 1941 Today's Date: 09/05/2022 Time: 7829-5621 SLP Time Calculation (min) (ACUTE ONLY): 19 min  Assessment / Plan / Recommendation Clinical Impression  Pt seen for ongoing dysphagia management.  Pt was asleep on arrival but able to rouse and maintain wakefulness for 20 minutes session.  Pt consumed puree and HTL by spoon with no clinical s/s of aspiration and exhibited good oral clearance.  Pt is now off continuous EEG and has stopped keppra.  Will plan for MBSS next date.  Discussed timing with Raymond Sampson who was in room and reports he has been most alert after lunch time around 1 PM.    Recommend pt remain NPO with alternate means of nutrition, hydration, and medication. Pt may have small amounts of HTL and puree by spoon after good oral care, when awake alert, with upright positioning, and 1:1 assistance   HPI HPI: 82 year old male who presented for fall. Hit forehead with laceration repair, no LOC, no facial fx, but CT 5/18 revealed: "stable large, approximately 17 mm thick Right Subdural Hematoma, most pronounced along the anterior right frontal convexity. Stable small volume right hemisphere [facility]." Chest CT 5/17: "Emphysema. Right pleural effusion. Hazy upper lobe airspace disease is indeterminate for edema or pneumonia." Neuro change noted by RN around 1300 on 5/19 with workup revealing seizures, now controlled with medication. Pt with history of CAD, prior stroke, hypertension, hyperlipidemia, type 2 diabetes, chronic antiplatelet use with aspirin and Plavix. MBS revealed hardware from ACDF. Cortrak placed 5/22;ST f/u for dysphagia tx/management      SLP Plan  MBS      Recommendations for follow up therapy are one component of a multi-disciplinary discharge planning process, led by the attending physician.  Recommendations may be updated based on patient status, additional functional  criteria and insurance authorization.    Recommendations  Diet recommendations: NPO Medication Administration: Via alternative means                  Oral care QID;Staff/trained caregiver to provide oral care;Oral care prior to ice chip/H20;Oral care before and after PO   Frequent or constant Supervision/Assistance Dysphagia, oropharyngeal phase (R13.12)     MBS     Raymond Pleasure, MA, CCC-SLP Acute Rehabilitation Services Office: [phone] 09/05/2022, 9:57 AM

## 2022-09-05 NOTE — Progress Notes (Addendum)
Subjective: No acute events overnight.  Per wife, was more awake and alert this morning, had some breakfast.  However drowsy again.  ROS: Unable to obtain due to poor mental status  Examination  Vital signs in last 24 hours: Temp:  [97.4 F (36.3 C)-98.4 F (36.9 C)] 97.6 F (36.4 C) (06/04 0837) Pulse Rate:  [71-80] 80 (06/04 0837) Resp:  [16-20] 18 (06/04 0837) BP: (131-140)/(56-70) 135/65 (06/04 0837) SpO2:  [94 %-98 %] 98 % (06/04 0837) Weight:  [65.9 kg] 65.9 kg (06/04 0405)  General: lying in bed, NAD Neuro: MS: Lethargic, able to tell me his name but did not answer rest of the orientation questions, did follow simple one-step commands consistently  CN: pupils equal and reactive,  EOMI, face symmetric, tongue midline, normal sensation over face, Motor: Antigravity strength in all 4 extremities  Basic Metabolic Panel: Recent Labs  Lab 08/30/22 0606 08/31/22 1525 09/03/22 0403 09/04/22 0742 09/05/22 0631  NA 132* 132* 131*  --  135  K 4.2 4.7 4.6  --  4.4  CL 98 98 99  --  101  CO2 26 24 25   --  24  GLUCOSE 195* 149* 136*  --  164*  BUN 33* 40* 51*  --  47*  CREATININE 0.80 0.80 0.82 0.86 0.86  CALCIUM 8.7* 8.6* 8.5*  --  8.7*  MG 1.8  --   --   --   --   PHOS 3.0  --   --   --   --     CBC: Recent Labs  Lab 08/30/22 0606 08/31/22 1525 09/03/22 0403 09/05/22 0631  WBC 11.6* 11.6* 13.4* 12.4*  NEUTROABS  --  8.3* 9.9* 9.2*  HGB 10.4* 10.9* 10.0* 10.2*  HCT 32.5* 33.5* 31.0* 31.4*  MCV 91.8 94.4 93.7 94.0  PLT 396 408* 443* 443*     Coagulation Studies: No results for input(s): "LABPROT", "INR" in the last 72 hours.  Imaging No new brain imaging  ASSESSMENT AND PLAN: 82 year old male with right subdural hematoma secondary to fall now with seizures.    Traumatic subdural hematoma Provoked seizures -Seizures most likely secondary to underlying subdural hematoma   Recommendations - Reduce Vimpat 50 mg twice daily to minimize sedation -Also  discussed with wife about starting amantadine for neurostimulation if needed -Also discussed with wife that I will aggressively reducing antiseizure medications to minimize sedation.  However this does increase the risk of seizure recurrence.  However, patient has not been awaiting a to take adequate p.o. intake and does not want feeding tube.  Therefore, trying to optimize medication to minimize sedation ASAP -Continue seizure precautions -As needed IV Versed for clinical seizures -Discussed plan with family at bedside and Dr. Blake Divine via secure chat   I have spent a total of  35 minutes with the patient reviewing hospital notes,  test results, labs and examining the patient as well as establishing an assessment and plan.  > 50% of time was spent in direct patient care.    Lindie Spruce Epilepsy [facility] For questions after 5pm please refer to AMION to reach the Neurologist on call

## 2022-09-06 ENCOUNTER — Inpatient Hospital Stay (HOSPITAL_COMMUNITY): Payer: PPO

## 2022-09-06 DIAGNOSIS — E871 Hypo-osmolality and hyponatremia: Secondary | ICD-10-CM

## 2022-09-06 DIAGNOSIS — S065XAA Traumatic subdural hemorrhage with loss of consciousness status unknown, initial encounter: Secondary | ICD-10-CM | POA: Diagnosis not present

## 2022-09-06 DIAGNOSIS — E119 Type 2 diabetes mellitus without complications: Secondary | ICD-10-CM

## 2022-09-06 DIAGNOSIS — R569 Unspecified convulsions: Secondary | ICD-10-CM | POA: Diagnosis not present

## 2022-09-06 DIAGNOSIS — E876 Hypokalemia: Secondary | ICD-10-CM

## 2022-09-06 DIAGNOSIS — S065X0D Traumatic subdural hemorrhage without loss of consciousness, subsequent encounter: Secondary | ICD-10-CM | POA: Diagnosis not present

## 2022-09-06 DIAGNOSIS — I639 Cerebral infarction, unspecified: Secondary | ICD-10-CM

## 2022-09-06 DIAGNOSIS — G9340 Encephalopathy, unspecified: Secondary | ICD-10-CM | POA: Diagnosis not present

## 2022-09-06 DIAGNOSIS — R1310 Dysphagia, unspecified: Secondary | ICD-10-CM

## 2022-09-06 DIAGNOSIS — Z515 Encounter for palliative care: Secondary | ICD-10-CM | POA: Diagnosis not present

## 2022-09-06 DIAGNOSIS — E43 Unspecified severe protein-calorie malnutrition: Secondary | ICD-10-CM | POA: Diagnosis not present

## 2022-09-06 DIAGNOSIS — I2510 Atherosclerotic heart disease of native coronary artery without angina pectoris: Secondary | ICD-10-CM

## 2022-09-06 DIAGNOSIS — S0181XA Laceration without foreign body of other part of head, initial encounter: Secondary | ICD-10-CM | POA: Diagnosis not present

## 2022-09-06 DIAGNOSIS — Z9861 Coronary angioplasty status: Secondary | ICD-10-CM

## 2022-09-06 LAB — GLUCOSE, CAPILLARY
Glucose-Capillary: 174 mg/dL — ABNORMAL HIGH (ref 70–99)
Glucose-Capillary: 140 mg/dL — ABNORMAL HIGH (ref 70–99)
Glucose-Capillary: 159 mg/dL — ABNORMAL HIGH (ref 70–99)
Glucose-Capillary: 186 mg/dL — ABNORMAL HIGH (ref 70–99)
Glucose-Capillary: 197 mg/dL — ABNORMAL HIGH (ref 70–99)

## 2022-09-06 LAB — CBC WITH DIFFERENTIAL/PLATELET
Abs Immature Granulocytes: 0.04 10*3/uL (ref 0.00–0.07)
Basophils Absolute: 0 10*3/uL (ref 0.0–0.1)
Basophils Relative: 0 %
Eosinophils Absolute: 0.5 10*3/uL (ref 0.0–0.5)
Eosinophils Relative: 4 %
HCT: 30.4 % — ABNORMAL LOW (ref 39.0–52.0)
Hemoglobin: 9.6 g/dL — ABNORMAL LOW (ref 13.0–17.0)
Immature Granulocytes: 0 %
Lymphocytes Relative: 12 %
Lymphs Abs: 1.5 10*3/uL (ref 0.7–4.0)
MCH: 29.8 pg (ref 26.0–34.0)
MCHC: 31.6 g/dL (ref 30.0–36.0)
MCV: 94.4 fL (ref 80.0–100.0)
Monocytes Absolute: 1.1 10*3/uL — ABNORMAL HIGH (ref 0.1–1.0)
Monocytes Relative: 9 %
Neutro Abs: 9.5 10*3/uL — ABNORMAL HIGH (ref 1.7–7.7)
Neutrophils Relative %: 75 %
Platelets: 442 10*3/uL — ABNORMAL HIGH (ref 150–400)
RBC: 3.22 MIL/uL — ABNORMAL LOW (ref 4.22–5.81)
RDW: 15.3 % (ref 11.5–15.5)
WBC: 12.6 10*3/uL — ABNORMAL HIGH (ref 4.0–10.5)
nRBC: 0 % (ref 0.0–0.2)

## 2022-09-06 LAB — RENAL FUNCTION PANEL
Albumin: 2.3 g/dL — ABNORMAL LOW (ref 3.5–5.0)
Anion gap: 8 (ref 5–15)
BUN: 55 mg/dL — ABNORMAL HIGH (ref 8–23)
CO2: 25 mmol/L (ref 22–32)
Calcium: 8.8 mg/dL — ABNORMAL LOW (ref 8.9–10.3)
Chloride: 102 mmol/L (ref 98–111)
Creatinine, Ser: 0.82 mg/dL (ref 0.61–1.24)
GFR, Estimated: >60 mL/min (ref >60)
Glucose, Bld: 150 mg/dL — ABNORMAL HIGH (ref 70–99)
Phosphorus: 3.3 mg/dL (ref 2.5–4.6)
Potassium: 4.2 mmol/L (ref 3.5–5.1)
Sodium: 135 mmol/L (ref 135–145)

## 2022-09-06 LAB — MAGNESIUM: Magnesium: 2.1 mg/dL (ref 1.7–2.4)

## 2022-09-06 MED ORDER — SODIUM CHLORIDE 0.9 % IV SOLN: INTRAVENOUS | Status: AC

## 2022-09-06 NOTE — Progress Notes (Signed)
PROGRESS NOTE    Raymond Sampson  ZOX:[id number] DOB: 12-19-1940 DOA: 08/18/2022 PCP: Gracelyn Nurse, MD    Chief Complaint  Patient presents with   Marletta Lor on Thinners    Brief Narrative:  82 year old M with PMH of CAD, CVA, HTN, HLD, DM-2, cervical spine fusion and OA presented to ED after fall at home and found to have traumatic right frontal SDH measuring 2.2 cm in maximum thickness with 5 to 6 mm midline shift to the left, and trace SAH at the right temporal lobe.  Patient was on Plavix and aspirin.  CT cervical spine without acute finding but some sclerotic changes at C2-C3 facet joint.  He was admitted to ICU by neurosurgery. He was initially minimally symptomatic and refused surgery.  However, he developed intermittent focal seizure.    Repeat CT head on 5/18 with stable SDH and SAH.  MRI brain showed multiple small/punctate areas of restricted diffusion with bilateral frontal and parietal cortex suspicious for tiny acute infarcts or small intraparenchymal contusion and/or shear type injury in the setting of trauma.  EEG on 5/21 showed intermittent seizure.  Neurology consulted.  Patient was started on Keppra and Vimpat.  Palliative medicine consulted as well.   Patient was transferred to hospitalist service on 5/23.  Subsequent LTM EEG without seizure.  Neurology signed off.  Encephalopathy improved but seems to be delirious.  Remains NPO except some pured diet from nursing stock.  He is on tube feed via cortrack.  SLP following.  Therapy recommended SNF. Patient remains very sleepy and lethargic, SLP unable to assess him due to sleepiness. Repeat CT head without contrast showed unchanged subdural hematoma.   Stopped keppra altogether today. LTM eeg Discontinued.    Assessment & Plan:   Principal Problem:   Traumatic subdural hematoma without loss of consciousness (HCC) Active Problems:   Provoked seizures (HCC)   Encephalopathy acute   DNR (do not resuscitate)    Protein-calorie malnutrition, severe   Facial laceration   Hyponatremia   SDH (subdural hematoma) (HCC)   Seizure (HCC)   Hypokalemia   Hypophosphatemia   Cerebrovascular accident (CVA) (HCC)   Dysphagia   CAD S/P percutaneous coronary angioplasty   #1 traumatic right subdural hematoma/subarachnoid hemorrhage with midline shift secondary to fall and antiplatelet therapy -Patient presented with a fall found to have a traumatic right subdural hematoma/subarachnoid hemorrhage with midline shift. -Repeat head CT done showed a stable hematoma. -Patient assessed by neurosurgery who recommended no surgical intervention at that time. -Recommendations were to hold aspirin and Plavix for a week and subsequently resumed. -PT/OT recommended SNF. -Patient still with cortrack currently n.p.o. due to excessive lethargy which is improving, SLP following. -Neurosurgery recommend outpatient follow-up.  2.  Fall at home, left frontal laceration -Status post repair with stitches. -Sutures removed 08/27/2022  3.  Provoked seizures -Felt secondary to right subdural hematoma and subarachnoid hemorrhage with midline shift. -EEG done on 09/01/2022 with seizures. -Patient had subsequent LTM EEG with evidence of epileptogenicity arising from the right central parietal region. -Patient was started on AEDs. -Patient noted to have increased lethargy, neurology reconsulted he was on LTM EEG which did not show any seizure activity. -Neurology initially decreased Keppra dose and finally discontinued it to minimize sedation. -Neurology recommending continuation of current dose of Vimpat at 50 twice daily and if continued lethargy may need amantadine. -Per neurology patient at risk for more seizures. -Neurology following.  4.  Acute metabolic encephalopathy -Likely secondary to subdural hematoma, provoked seizures  in the setting of memory deficits. -Avoid sedating medications or minimize sedating  medications. -Patient with some improvement with continuation of Keppra and decreased dose of Vimpat. -Patient noted to be intermittently awake and eating some food from floor stock component. -TSH done within normal limits, ammonia within normal limits. -Repeat head CT done showed unchanged subdural hematoma. -Urinalysis unremarkable. -Chest x-ray negative for any infiltrate. -Due to increased lethargy neurology reconsulted, patient was on LTM EEG which did not show any seizure activity and as such neurology decreased dose of Keppra initially and subsequently discontinued it to minimize sedation.  Vimpat dose also decreased to 50 mg twice daily. -With decreased AEDs, likely at risk for more seizures. -If continued lethargy neurology recommending possibility of starting patient on amantadine.  5.  Back pain -Likely secondary to fall. -X-rays are negative for any acute findings. -Lidoderm patch, pain control, PT/OT.  6.  Acute blood loss anemia -Patient with no overt bleeding. -No significant iron deficiency on anemia panel. -Status post transfusion 2 units PRBCs. -Hemoglobin currently stable at 9.6.  7.  CAD status post PCI/DES stent to LCx/OM 2 in January 2024. -Continue Coreg, aspirin, Plavix, Lipitor.  8.  Well-controlled non-insulin-dependent diabetes mellitus -Hemoglobin A1c 6.0 (08/21/2022) -CBG 140 this morning. -Continue Semglee 15 units daily, NovoLog 3 units every 4 hours, SSI.  9.  Hypertension -Continue Coreg, Norvasc.  10.  Hypokalemia/hypophosphatemia -Repleted.  11.  History of CVA -Continue aspirin, Plavix, Lipitor for secondary stroke prophylaxis.  12.  Hyponatremia Resolved.  13.  Dysphagia -Patient on honey thickened pure per nursing. -Assessed by SLP who recommended tube feeds for nutritional supplementation. -Patient noted not to be alert enough to pass it.  With SLP evaluation, improved mental status and alertness after discontinuation of Keppra and  decreasing Vimpat. -SLP following. -Family have decided no PEG tube placement at this time. -Palliative care following.  14.  Coughing and congestion -Chest x-ray no acute infiltrate. -Robitussin as needed.  15.  Mood disorder -Zoloft.  16.  Severe protein calorie malnutrition -Currently on tube feeds via cortrack. -Continue nutritional supplementation.     DVT prophylaxis: SCDs Code Status: DNR Family Communication: Updated patient and wife at bedside. Disposition: Likely SNF.    Status is: Inpatient Remains inpatient appropriate because: Severity of illness/unsafe disposition   Consultants:  Neurosurgery: Dr. Maurice Small 08/18/2022 Neurology: Dr. Selina Cooley 08/21/2022 Palliative care: Dr. Patterson Hammersmith 08/22/2022  Procedures:  CT head CT C-spine 08/18/2022 CT maxillofacial 08/18/2022 CT head 08/19/2022, 08/20/2022, 09/01/2022 CT angiogram head and neck 08/21/2022 Chest x-ray 09/03/2022  plain films of the left hip and pelvis 08/24/2022 MRI brain 08/20/2022 2D echo 08/22/2022 LTM EEG  Modified barium swallow 08/21/2022 Transfusion 2 units PRBCs 08/25/2022  Antimicrobials:  Anti-infectives (From admission, onward)    None         Subjective: Laying in bed.  Alert to self.  Wife at bedside.  Alert.  Following some commands.  Denies any chest pain or shortness of breath.  RN placing Lidoderm patch.  Objective: Vitals:   09/06/22 0540 09/06/22 0711 09/06/22 1131 09/06/22 1512  BP: (!) 146/65 (!) 141/61 121/67 (!) 150/71  Pulse: 79 71 75 75  Resp: 15 16 16 16   Temp: 98 F (36.7 C) 98.6 F (37 C) 98.9 F (37.2 C) 98.5 F (36.9 C)  TempSrc: Oral Axillary Oral Oral  SpO2:  98% 96% 98%  Weight:      Height:        Intake/Output Summary (Last 24 hours) at 09/06/2022 1816  Last data filed at 09/06/2022 1507 Gross per 24 hour  Intake 298.93 ml  Output --  Net 298.93 ml   Filed Weights   09/04/22 0500 09/05/22 0405 09/06/22 0418  Weight: 66.4 kg 65.9 kg 63.8 kg     Examination:  General exam: Appears calm and comfortable. Cortrack in place.   Respiratory system: Clear to auscultation anterior lung fields.Marland Kitchen Respiratory effort normal. Cardiovascular system: S1 & S2 heard, RRR. No JVD, murmurs, rubs, gallops or clicks. No pedal edema. Gastrointestinal system: Abdomen is nondistended, soft and nontender. No organomegaly or masses felt. Normal bowel sounds heard. Central nervous system: Alert.  Moving extremities spontaneously.  No focal neurological deficits. Extremities: Symmetric 5 x 5 power. Skin: No rashes, lesions or ulcers Psychiatry: Judgement and insight appear fair. Mood & affect appropriate.     Data Reviewed: I have personally reviewed following labs and imaging studies  CBC: Recent Labs  Lab 08/31/22 1525 09/03/22 0403 09/05/22 0631 09/06/22 1020  WBC 11.6* 13.4* 12.4* 12.6*  NEUTROABS 8.3* 9.9* 9.2* 9.5*  HGB 10.9* 10.0* 10.2* 9.6*  HCT 33.5* 31.0* 31.4* 30.4*  MCV 94.4 93.7 94.0 94.4  PLT 408* 443* 443* 442*    Basic Metabolic Panel: Recent Labs  Lab 08/31/22 1525 09/03/22 0403 09/04/22 0742 09/05/22 0631 09/06/22 1020  NA 132* 131*  --  135 135  K 4.7 4.6  --  4.4 4.2  CL 98 99  --  101 102  CO2 24 25  --  24 25  GLUCOSE 149* 136*  --  164* 150*  BUN 40* 51*  --  47* 55*  CREATININE 0.80 0.82 0.86 0.86 0.82  CALCIUM 8.6* 8.5*  --  8.7* 8.8*  MG  --   --   --   --  2.1  PHOS  --   --   --   --  3.3    GFR: Estimated Creatinine Clearance: 62.7 mL/min (by C-G formula based on SCr of 0.82 mg/dL).  Liver Function Tests: Recent Labs  Lab 09/03/22 0403 09/06/22 1020  AST 27  --   ALT 23  --   ALKPHOS 147*  --   BILITOT 0.5  --   PROT 6.4*  --   ALBUMIN 2.5* 2.3*    CBG: Recent Labs  Lab 09/05/22 2318 09/06/22 0413 09/06/22 0818 09/06/22 1132 09/06/22 1615  GLUCAP 150* 174* 140* 159* 186*     No results found for this or any previous visit (from the past 240 hour(s)).       Radiology  Studies: No results found.      Scheduled Meds:  amLODipine  10 mg Per Tube Daily   aspirin  81 mg Per Tube Daily   atorvastatin  80 mg Per Tube Daily   carvedilol  12.5 mg Per Tube BID WC   clopidogrel  75 mg Per Tube Daily   enoxaparin (LOVENOX) injection  40 mg Subcutaneous Q24H   feeding supplement (PROSource TF20)  60 mL Per Tube BID   Gerhardt's butt cream   Topical QID   insulin aspart  0-15 Units Subcutaneous Q4H   insulin aspart  3 Units Subcutaneous Q4H   insulin glargine-yfgn  15 Units Subcutaneous Daily   lidocaine  1 patch Transdermal Q24H   multivitamin with minerals  1 tablet Per Tube Daily   mouth rinse  15 mL Mouth Rinse 4 times per day   sertraline  25 mg Per Tube QPM   Continuous Infusions:  sodium chloride  Stopped (08/28/22 1547)   sodium chloride 75 mL/hr at 09/06/22 1151   feeding supplement (JEVITY 1.5 CAL/FIBER) 1,000 mL (09/06/22 1501)   lacosamide (VIMPAT) IV Stopped (09/06/22 1041)     LOS: 19 days    Time spent: 35 minutes    Ramiro Harvest, MD [facility]   To contact the attending provider between 7A-7P or the covering provider during after hours 7P-7A, please log into the web site [email or web address] and access using universal  password for that web site. If you do not have the password, please call the hospital operator.  09/06/2022, 6:16 PM

## 2022-09-06 NOTE — Progress Notes (Signed)
Subjective: No acute events overnight.  Wife and daughter at bedside.  Per wife, was much more awake this morning.  ROS: negative except above  Examination  Vital signs in last 24 hours: Temp:  [97.4 F (36.3 C)-98.9 F (37.2 C)] 98.9 F (37.2 C) (06/05 1131) Pulse Rate:  [71-80] 75 (06/05 1131) Resp:  [15-18] 16 (06/05 1131) BP: (111-148)/(59-80) 121/67 (06/05 1131) SpO2:  [96 %-98 %] 96 % (06/05 1131) Weight:  [63.8 kg] 63.8 kg (06/05 0418)  General: lying in bed, NAD Neuro: MS: Alert, oriented to person, did not answer rest of the orientation questions, did not follow commands, did not name objects CN: pupils equal and reactive,  EOMI, face symmetric, tongue midline, normal sensation over face, Motor: 5/5 strength in all 4 extremities  Basic Metabolic Panel: Recent Labs  Lab 08/31/22 1525 09/03/22 0403 09/04/22 0742 09/05/22 0631 09/06/22 1020  NA 132* 131*  --  135 135  K 4.7 4.6  --  4.4 4.2  CL 98 99  --  101 102  CO2 24 25  --  24 25  GLUCOSE 149* 136*  --  164* 150*  BUN 40* 51*  --  47* 55*  CREATININE 0.80 0.82 0.86 0.86 0.82  CALCIUM 8.6* 8.5*  --  8.7* 8.8*  MG  --   --   --   --  2.1  PHOS  --   --   --   --  3.3    CBC: Recent Labs  Lab 08/31/22 1525 09/03/22 0403 09/05/22 0631 09/06/22 1020  WBC 11.6* 13.4* 12.4* 12.6*  NEUTROABS 8.3* 9.9* 9.2* 9.5*  HGB 10.9* 10.0* 10.2* 9.6*  HCT 33.5* 31.0* 31.4* 30.4*  MCV 94.4 93.7 94.0 94.4  PLT 408* 443* 443* 442*    Coagulation Studies: No results for input(s): "LABPROT", "INR" in the last 72 hours.  Imaging No new brain imaging   ASSESSMENT AND PLAN: 82 year old male with right subdural hematoma secondary to fall now with seizures.    Traumatic subdural hematoma Provoked seizures -Seizures most likely secondary to underlying subdural hematoma   Recommendations - Continue Vimpat 50mg  BID, hesistant to stop as patient had frequent seizures - Can start amantadine for neurostimulation if  needed -Continue seizure precautions -As needed IV Versed for clinical seizures -Discussed plan with family at bedside and Dr.Thompson via secure chat   I have spent a total of  25 minutes with the patient reviewing hospital notes,  test results, labs and examining the patient as well as establishing an assessment and plan.  > 50% of time was spent in direct patient care.  Lindie Spruce Epilepsy [facility] For questions after 5pm please refer to AMION to reach the Neurologist on call

## 2022-09-06 NOTE — Progress Notes (Signed)
Overnight event  Notified by RN that patient's right pupil appears slightly larger in size compared to what was documented by previous nurse.  Otherwise, patient is awake and alert, oriented to self with no change in mentation, and has no other neurologic deficit on exam.  Chart reviewed: Patient admitted on 5/17 for traumatic right subdural hematoma/subarachnoid hemorrhage with midline shift secondary to fall and antiplatelet therapy. Neurosurgery recommended no surgical intervention.  Patient did have provoked seizures and was started on AEDs.  Aspirin and Plavix were resumed on 5/28. Last CT head done 5/31 was showing stable findings.  -Stat repeat CT head ordered

## 2022-09-06 NOTE — Progress Notes (Signed)
SLP Cancellation Note  Patient Details Name: Raymond Sampson MRN: [id number] DOB: 1940-09-16   Cancelled treatment:       Reason Eval/Treat Not Completed: Fatigue/lethargy limiting ability to participate; ST will continue efforts to complete MBS as pt mentation allows.   Pat Raymond Sampson,M.S., CCC-SLP 09/06/2022, 1:11 PM

## 2022-09-06 NOTE — Progress Notes (Addendum)
Physical Therapy Treatment Patient Details Name: Raymond Sampson MRN: [id number] DOB: April 14, 1940 Today's Date: 09/06/2022   History of Present Illness 82 year old male admitted 08/18/22 after fall at home. CT head 5/18 revealed: large Right Subdural Hematoma, most pronounced along the anterior right frontal convexity.  Developed increased lethargy and change in pupilary size on 5/16 and MRI sowed multiple punctate areas of restricted diffusion in bilateral frontal and parietal cortex suspicious for tiny acute infarcts. LTM EEG positive for nonconvulsive seizures. Pt with history of CAD, prior stroke, hypertension, hyperlipidemia, type 2 diabetes, prior fall in March this year with SNF stay, chronic antiplatelet use with aspirin and Plavix.    PT Comments    Pt seen for PT tx with wife present but exiting, daughter present for session. Pt alert & engaging with PT & family but begins to fatigue quickly as session progresses. Pt with difficulty following simple commands for functional mobility tasks. Pt requires max assist for supine>sit, max<>total assist for sit>supine, total assist +2 for scooting to [facility]. Pt sat EOB ~5 minutes with max fade to as little as min<>mod assist. While sitting EOB pt engaged in reaching for targets to focus on balance retraining as pt with L/posterior lean. Pt with improved ability to visually locate items to R of midline but when PT covered pt's R eye pt able to locate his daughter in L visual field. Session limited by SLP arriving to take pt to scheduled swallow study. Continue to recommend ongoing PT services while in acute setting.  PT reviewed all PT goals & updated them as appropriate; sitting balance goal was the only goal upgraded.    Recommendations for follow up therapy are one component of a multi-disciplinary discharge planning process, led by the attending physician.  Recommendations may be updated based on patient status, additional functional criteria and  insurance authorization.  Follow Up Recommendations  Can patient physically be transported by private vehicle: No    Assistance Recommended at Discharge Frequent or constant Supervision/Assistance  Patient can return home with the following Two people to help with walking and/or transfers;Two people to help with bathing/dressing/bathroom;Assist for transportation;Help with stairs or ramp for entrance;Direct supervision/assist for medications management;Direct supervision/assist for financial management;Assistance with cooking/housework;Assistance with feeding   Equipment Recommendations  Wheelchair (measurements PT);Wheelchair cushion (measurements PT);Hospital bed;Other (comment) (air mattress & hoyer lift with sling if pt goes home instead)    Recommendations for Other Services       Precautions / Restrictions Precautions Precautions: Fall Precaution Comments: Seizures; cortrak Restrictions Weight Bearing Restrictions: No     Mobility  Bed Mobility Overal bed mobility: Needs Assistance Bed Mobility: Supine to Sit Rolling: Max assist     Sit to supine: Max assist, +2 for physical assistance, Total assist        Transfers                        Ambulation/Gait                   Stairs             Wheelchair Mobility    Modified Rankin (Stroke Patients Only)       Balance Overall balance assessment: Needs assistance Sitting-balance support: Feet supported, Bilateral upper extremity supported, Single extremity supported Sitting balance-Leahy Scale: Poor Sitting balance - Comments: Pt with posterior L lean with pt engaging in sitting balance activities for balance retraining. Pt tolerates sitting EOB with max assist fade to  as little as min assist.                                    Cognition Arousal/Alertness: Lethargic Behavior During Therapy: Flat affect Overall Cognitive Status: Impaired/Different from baseline Area  of Impairment: Attention, Memory, Following commands, Safety/judgement, Problem solving, Awareness, Orientation                 Orientation Level: Disoriented to, Time   Memory: Decreased short-term memory Following Commands: Follows one step commands inconsistently, Follows one step commands with increased time Safety/Judgement: Decreased awareness of safety, Decreased awareness of deficits Awareness: Intellectual Problem Solving: Slow processing, Decreased initiation, Difficulty sequencing, Requires verbal cues, Requires tactile cues General Comments: Pt received in bed, awake, joking around with family & PT, begins to fatigue quickly requiring max encouragement/cuing to maintain eyes open.        Exercises      General Comments        Pertinent Vitals/Pain Pain Assessment Pain Assessment: Faces Faces Pain Scale: Hurts even more Pain Location: cortrak when pulled, relieved with repositioning Pain Descriptors / Indicators: Guarding, Grimacing, Discomfort Pain Intervention(s): Repositioned    Home Living                          Prior Function            PT Goals (current goals can now be found in the care plan section) Acute Rehab PT Goals Patient Stated Goal: to go home PT Goal Formulation: With patient/family Time For Goal Achievement: 09/20/22 Potential to Achieve Goals: Fair Progress towards PT goals: Progressing toward goals    Frequency    Min 3X/week      PT Plan Current plan remains appropriate    Co-evaluation              AM-PAC PT "6 Clicks" Mobility   Outcome Measure  Help needed turning from your back to your side while in a flat bed without using bedrails?: A Lot Help needed moving from lying on your back to sitting on the side of a flat bed without using bedrails?: Total Help needed moving to and from a bed to a chair (including a wheelchair)?: Total Help needed standing up from a chair using your arms (e.g.,  wheelchair or bedside chair)?: Total Help needed to walk in hospital room?: Total Help needed climbing 3-5 steps with a railing? : Total 6 Click Score: 7    End of Session   Activity Tolerance: Patient limited by fatigue (limited by SLP arriving to take pt for scheduled swallow study) Patient left: in bed;with call bell/phone within reach;with family/visitor present;with SCD's reapplied;with bed alarm set   PT Visit Diagnosis: Other symptoms and signs involving the nervous system (R29.898);Muscle weakness (generalized) (M62.81);Other abnormalities of gait and mobility (R26.89);Unsteadiness on feet (R26.81);Difficulty in walking, not elsewhere classified (R26.2)     Time: 1610-9604 PT Time Calculation (min) (ACUTE ONLY): 19 min  Charges:  $Neuromuscular Re-education: 8-22 mins                     Aleda Grana, PT, DPT 09/06/22, 1:49 PM   Sandi Mariscal 09/06/2022, 1:47 PM

## 2022-09-07 ENCOUNTER — Inpatient Hospital Stay (HOSPITAL_COMMUNITY): Payer: PPO

## 2022-09-07 ENCOUNTER — Encounter: Payer: Self-pay | Admitting: *Deleted

## 2022-09-07 ENCOUNTER — Telehealth (HOSPITAL_COMMUNITY): Payer: Self-pay | Admitting: Pharmacy Technician

## 2022-09-07 ENCOUNTER — Other Ambulatory Visit (HOSPITAL_COMMUNITY): Payer: Self-pay

## 2022-09-07 DIAGNOSIS — R569 Unspecified convulsions: Secondary | ICD-10-CM | POA: Diagnosis not present

## 2022-09-07 DIAGNOSIS — I214 Non-ST elevation (NSTEMI) myocardial infarction: Secondary | ICD-10-CM

## 2022-09-07 DIAGNOSIS — Z515 Encounter for palliative care: Secondary | ICD-10-CM | POA: Diagnosis not present

## 2022-09-07 DIAGNOSIS — S0181XA Laceration without foreign body of other part of head, initial encounter: Secondary | ICD-10-CM | POA: Diagnosis not present

## 2022-09-07 DIAGNOSIS — S065X0D Traumatic subdural hemorrhage without loss of consciousness, subsequent encounter: Secondary | ICD-10-CM | POA: Diagnosis not present

## 2022-09-07 DIAGNOSIS — E43 Unspecified severe protein-calorie malnutrition: Secondary | ICD-10-CM | POA: Diagnosis not present

## 2022-09-07 DIAGNOSIS — Z955 Presence of coronary angioplasty implant and graft: Secondary | ICD-10-CM

## 2022-09-07 DIAGNOSIS — R1310 Dysphagia, unspecified: Secondary | ICD-10-CM | POA: Diagnosis not present

## 2022-09-07 DIAGNOSIS — G9340 Encephalopathy, unspecified: Secondary | ICD-10-CM | POA: Diagnosis not present

## 2022-09-07 DIAGNOSIS — S065XAA Traumatic subdural hemorrhage with loss of consciousness status unknown, initial encounter: Secondary | ICD-10-CM | POA: Diagnosis not present

## 2022-09-07 LAB — CBC
HCT: 31.8 % — ABNORMAL LOW (ref 39.0–52.0)
Hemoglobin: 10.2 g/dL — ABNORMAL LOW (ref 13.0–17.0)
MCH: 30.3 pg (ref 26.0–34.0)
MCHC: 32.1 g/dL (ref 30.0–36.0)
MCV: 94.4 fL (ref 80.0–100.0)
Platelets: 431 10*3/uL — ABNORMAL HIGH (ref 150–400)
RBC: 3.37 MIL/uL — ABNORMAL LOW (ref 4.22–5.81)
RDW: 15.2 % (ref 11.5–15.5)
WBC: 10.4 10*3/uL (ref 4.0–10.5)
nRBC: 0 % (ref 0.0–0.2)

## 2022-09-07 LAB — RENAL FUNCTION PANEL
Albumin: 2.4 g/dL — ABNORMAL LOW (ref 3.5–5.0)
Anion gap: 8 (ref 5–15)
BUN: 47 mg/dL — ABNORMAL HIGH (ref 8–23)
CO2: 23 mmol/L (ref 22–32)
Calcium: 8.5 mg/dL — ABNORMAL LOW (ref 8.9–10.3)
Chloride: 103 mmol/L (ref 98–111)
Creatinine, Ser: 0.67 mg/dL (ref 0.61–1.24)
GFR, Estimated: >60 mL/min (ref >60)
Glucose, Bld: 176 mg/dL — ABNORMAL HIGH (ref 70–99)
Phosphorus: 2.9 mg/dL (ref 2.5–4.6)
Potassium: 4 mmol/L (ref 3.5–5.1)
Sodium: 134 mmol/L — ABNORMAL LOW (ref 135–145)

## 2022-09-07 LAB — GLUCOSE, CAPILLARY
Glucose-Capillary: 101 mg/dL — ABNORMAL HIGH (ref 70–99)
Glucose-Capillary: 121 mg/dL — ABNORMAL HIGH (ref 70–99)
Glucose-Capillary: 143 mg/dL — ABNORMAL HIGH (ref 70–99)
Glucose-Capillary: 161 mg/dL — ABNORMAL HIGH (ref 70–99)
Glucose-Capillary: 163 mg/dL — ABNORMAL HIGH (ref 70–99)
Glucose-Capillary: 169 mg/dL — ABNORMAL HIGH (ref 70–99)
Glucose-Capillary: 176 mg/dL — ABNORMAL HIGH (ref 70–99)

## 2022-09-07 LAB — MAGNESIUM: Magnesium: 1.9 mg/dL (ref 1.7–2.4)

## 2022-09-07 MED ORDER — PROSOURCE TF20 ENFIT COMPATIBL EN LIQD
60.0000 mL | Freq: Every day | ENTERAL | Status: DC
  Administered 2022-09-07 – 2022-09-10 (×4): 60 mL
  Filled 2022-09-07 (×5): qty 60

## 2022-09-07 NOTE — Progress Notes (Signed)
PROGRESS NOTE    Raymond Sampson  ZOX:[id number] DOB: 1940-08-27 DOA: 08/18/2022 PCP: Gracelyn Nurse, MD    Chief Complaint  Patient presents with   Marletta Lor on Thinners    Brief Narrative:  81 year old M with PMH of CAD, CVA, HTN, HLD, DM-2, cervical spine fusion and OA presented to ED after fall at home and found to have traumatic right frontal SDH measuring 2.2 cm in maximum thickness with 5 to 6 mm midline shift to the left, and trace SAH at the right temporal lobe.  Patient was on Plavix and aspirin.  CT cervical spine without acute finding but some sclerotic changes at C2-C3 facet joint.  He was admitted to ICU by neurosurgery. He was initially minimally symptomatic and refused surgery.  However, he developed intermittent focal seizure.    Repeat CT head on 5/18 with stable SDH and SAH.  MRI brain showed multiple small/punctate areas of restricted diffusion with bilateral frontal and parietal cortex suspicious for tiny acute infarcts or small intraparenchymal contusion and/or shear type injury in the setting of trauma.  EEG on 5/21 showed intermittent seizure.  Neurology consulted.  Patient was started on Keppra and Vimpat.  Palliative medicine consulted as well.   Patient was transferred to hospitalist service on 5/23.  Subsequent LTM EEG without seizure.  Neurology signed off.  Encephalopathy improved but seems to be delirious.  Remains NPO except some pured diet from nursing stock.  He is on tube feed via cortrack.  SLP following.  Therapy recommended SNF. Patient remains very sleepy and lethargic, SLP unable to assess him due to sleepiness. Repeat CT head without contrast showed unchanged subdural hematoma.   Stopped keppra altogether today. LTM eeg Discontinued.    Assessment & Plan:   Principal Problem:   Traumatic subdural hematoma without loss of consciousness (HCC) Active Problems:   Provoked seizures (HCC)   Encephalopathy acute   DNR (do not resuscitate)    Protein-calorie malnutrition, severe   Facial laceration   Hyponatremia   SDH (subdural hematoma) (HCC)   Seizure (HCC)   Hypokalemia   Hypophosphatemia   Cerebrovascular accident (CVA) (HCC)   Dysphagia   CAD S/P percutaneous coronary angioplasty   #1 traumatic right subdural hematoma/subarachnoid hemorrhage with midline shift secondary to fall and antiplatelet therapy -Patient presented with a fall found to have a traumatic right subdural hematoma/subarachnoid hemorrhage with midline shift. -Repeat head CT done showed a stable hematoma. -Patient assessed by neurosurgery who recommended no surgical intervention at that time. -Recommendations were to hold aspirin and Plavix for a week and subsequently these have been resumed. -PT/OT recommended SNF. -Patient still with cortrack currently n.p.o. due to excessive lethargy which is improving, SLP following. -Overnight (09/06/2022) due to concerns for change in pupil size, repeat head CT was done with no significant change. -Neurosurgery recommend outpatient follow-up.  2.  Fall at home, left frontal laceration -Status post repair with stitches. -Sutures removed 08/27/2022  3.  Provoked seizures -Felt secondary to right subdural hematoma and subarachnoid hemorrhage with midline shift. -EEG done on 09/01/2022 with seizures. -Patient had subsequent LTM EEG with evidence of epileptogenicity arising from the right central parietal region. -Patient was started on AEDs. -Patient noted to have increased lethargy, neurology reconsulted he was on LTM EEG which did not show any seizure activity. -Neurology initially decreased Keppra dose and finally discontinued it to minimize sedation. -Neurology recommending continuation of current dose of Vimpat at 50 twice daily and if continued lethargy may need amantadine. -Per  neurology patient at risk for more seizures. -Neurology following.  4.  Acute metabolic encephalopathy -Likely secondary to  subdural hematoma, provoked seizures in the setting of memory deficits. -Avoid sedating medications or minimize sedating medications. -Patient with some improvement with continuation of Keppra and decreased dose of Vimpat. -Patient noted to be intermittently awake and eating some food from floor stock component. -TSH done within normal limits, ammonia within normal limits. -Repeat head CT done showed unchanged subdural hematoma. -Urinalysis unremarkable. -Chest x-ray negative for any infiltrate. -Due to increased lethargy neurology reconsulted, patient was on LTM EEG which did not show any seizure activity and as such neurology decreased dose of Keppra initially and subsequently discontinued it to minimize sedation.  Vimpat dose also decreased to 50 mg twice daily. -With decreased AEDs, likely at risk for more seizures. -If continued lethargy neurology recommending possibility of starting patient on amantadine. -Family seems hesitant today to start amantadine and recommended monitoring patient for another 24 hours prior to this being instituted.  5.  Back pain -Likely secondary to fall. -X-rays are negative for any acute findings. -Lidoderm patch, pain control, PT/OT.  6.  Acute blood loss anemia -Patient with no overt bleeding. -No significant iron deficiency on anemia panel. -Status post transfusion 2 units PRBCs. -Hemoglobin currently stable at 10.2.  7.  CAD status post PCI/DES stent to LCx/OM 2 in January 2024. -Aspirin, Plavix, Coreg, Lipitor.   8.  Well-controlled non-insulin-dependent diabetes mellitus -Hemoglobin A1c 6.0 (08/21/2022) -CBG 176 this morning. -Continue Semglee 15 units daily, NovoLog 3 units every 4 hours, SSI.  9.  Hypertension -Norvasc, Coreg.   10.  Hypokalemia/hypophosphatemia -Repleted.    11.  History of CVA -Continue aspirin, Plavix, Lipitor for secondary stroke prophylaxis.    12.  Hyponatremia Resolved.  13.  Dysphagia -Patient on honey  thickened pure per nursing. -Assessed by SLP who recommended tube feeds for nutritional supplementation. -Patient noted not to be alert enough to pass it.  With SLP evaluation, improved mental status and alertness after discontinuation of Keppra and decreasing Vimpat. -SLP following. -Family have decided no PEG tube placement at this time. -Palliative care following.  14.  Coughing and congestion -Chest x-ray no acute infiltrate. -Robitussin as needed.  15.  Mood disorder -Continue Zoloft.    16.  Severe protein calorie malnutrition -Currently on tube feeds via cortrack. -Continue nutritional supplementation.     DVT prophylaxis: SCDs Code Status: DNR Family Communication: Updated wife at bedside. Disposition: Likely SNF.    Status is: Inpatient Remains inpatient appropriate because: Severity of illness/unsafe disposition   Consultants:  Neurosurgery: Dr. Maurice Small 08/18/2022 Neurology: Dr. Selina Cooley 08/21/2022 Palliative care: Dr. Patterson Hammersmith 08/22/2022  Procedures:  CT head CT C-spine 08/18/2022 CT maxillofacial 08/18/2022 CT head 08/19/2022, 08/20/2022, 09/01/2022 CT angiogram head and neck 08/21/2022 Chest x-ray 09/03/2022  plain films of the left hip and pelvis 08/24/2022 MRI brain 08/20/2022 2D echo 08/22/2022 LTM EEG  Modified barium swallow 08/21/2022 Transfusion 2 units PRBCs 08/25/2022 CT head 09/07/2022  Antimicrobials:  Anti-infectives (From admission, onward)    None         Subjective: Patient sleeping.  Wife at bedside.  Wife states patient was alert this morning when she got here, drank some coffee and some Ensure and was more alert this morning.  Wife states patient just worked with physical therapy and feels may have tired him out and as such patient currently is sleeping.  Events overnight noted.  Objective: Vitals:   09/07/22 0049 09/07/22 0500 09/07/22 2956  09/07/22 1111  BP: (!) 150/81 (!) 153/65 (!) 144/72 (!) 144/63  Pulse: 74 73 79 73  Resp: 16 16 17 17    Temp: 98.7 F (37.1 C) 98.8 F (37.1 C) 97.9 F (36.6 C) 97.7 F (36.5 C)  TempSrc: Axillary Axillary Oral Oral  SpO2: 96% 95% 96% 97%  Weight:  65.3 kg    Height:        Intake/Output Summary (Last 24 hours) at 09/07/2022 1209 Last data filed at 09/07/2022 0100 Gross per 24 hour  Intake 1103.35 ml  Output 1500 ml  Net -396.65 ml    Filed Weights   09/05/22 0405 09/06/22 0418 09/07/22 0500  Weight: 65.9 kg 63.8 kg 65.3 kg    Examination:  General exam: Sleeping peacefully.  Cortrack in place.  Respiratory system: CTAB.  No wheezes, no crackles, no rhonchi.  Fair air movement.  Speaking in full sentences.   Cardiovascular system: Regular rate rhythm no murmurs rubs or gallops.  No JVD.  No lower extremity edema.  Gastrointestinal system: Abdomen is soft, nontender, nondistended, positive bowel sounds.  No rebound.  No guarding.  Central nervous system: Sleeping however noted to be alert this morning per family.  Moving extremities spontaneously.  No focal neurological deficits.  Extremities: Symmetric 5 x 5 power. Skin: No rashes, lesions or ulcers Psychiatry: Judgement and insight unable to assess.  Mood & affect appropriate.     Data Reviewed: I have personally reviewed following labs and imaging studies  CBC: Recent Labs  Lab 08/31/22 1525 09/03/22 0403 09/05/22 0631 09/06/22 1020 09/07/22 0851  WBC 11.6* 13.4* 12.4* 12.6* 10.4  NEUTROABS 8.3* 9.9* 9.2* 9.5*  --   HGB 10.9* 10.0* 10.2* 9.6* 10.2*  HCT 33.5* 31.0* 31.4* 30.4* 31.8*  MCV 94.4 93.7 94.0 94.4 94.4  PLT 408* 443* 443* 442* 431*     Basic Metabolic Panel: Recent Labs  Lab 08/31/22 1525 09/03/22 0403 09/04/22 0742 09/05/22 0631 09/06/22 1020 09/07/22 0851  NA 132* 131*  --  135 135 134*  K 4.7 4.6  --  4.4 4.2 4.0  CL 98 99  --  101 102 103  CO2 24 25  --  24 25 23   GLUCOSE 149* 136*  --  164* 150* 176*  BUN 40* 51*  --  47* 55* 47*  CREATININE 0.80 0.82 0.86 0.86 0.82 0.67  CALCIUM  8.6* 8.5*  --  8.7* 8.8* 8.5*  MG  --   --   --   --  2.1 1.9  PHOS  --   --   --   --  3.3 2.9     GFR: Estimated Creatinine Clearance: 65.8 mL/min (by C-G formula based on SCr of 0.67 mg/dL).  Liver Function Tests: Recent Labs  Lab 09/03/22 0403 09/06/22 1020 09/07/22 0851  AST 27  --   --   ALT 23  --   --   ALKPHOS 147*  --   --   BILITOT 0.5  --   --   PROT 6.4*  --   --   ALBUMIN 2.5* 2.3* 2.4*     CBG: Recent Labs  Lab 09/06/22 1942 09/07/22 0052 09/07/22 0530 09/07/22 0738 09/07/22 1117  GLUCAP 197* 121* 143* 176* 163*      No results found for this or any previous visit (from the past 240 hour(s)).       Radiology Studies: CT HEAD WO CONTRAST ( )  Result Date: 09/07/2022 CLINICAL DATA:  Subarachnoid hemorrhage EXAM:  CT HEAD WITHOUT CONTRAST TECHNIQUE: Contiguous axial images were obtained from the base of the skull through the vertex without intravenous contrast. RADIATION DOSE REDUCTION: This exam was performed according to the departmental dose-optimization program which includes automated exposure control, adjustment of the mA and/or kV according to patient size and/or use of iterative reconstruction technique. COMPARISON:  09/01/2022 FINDINGS: Brain: Unchanged appearance of intermediate attenuation right convexity subdural hematoma measuring up to 16 mm in thickness. Isodense left subdural collection, likely chronic hematoma, is also unchanged. Persistent mild mass effect on the right hemisphere without midline shift. No new site of hemorrhage. There is periventricular hypoattenuation compatible with chronic microvascular disease. Mild generalized volume loss. Old left occipital infarct with ex vacuo dilatation of the atrium of the left lateral ventricle. Vascular: Mild atherosclerosis at the skull base. Skull: Negative Sinuses/Orbits: Near complete opacification of the left maxillary sinus. Normal orbits. Other: None IMPRESSION: 1. Unchanged appearance of  intermediate attenuation right convexity subdural hematoma measuring up to 16 mm in thickness. 2. Unchanged appearance of isodense left subdural collection, likely chronic hematoma. 3. Old left occipital infarct. Electronically Signed   By: Deatra Robinson M.D.   On: 09/07/2022 00:55        Scheduled Meds:  amLODipine  10 mg Per Tube Daily   aspirin  81 mg Per Tube Daily   atorvastatin  80 mg Per Tube Daily   carvedilol  12.5 mg Per Tube BID WC   clopidogrel  75 mg Per Tube Daily   enoxaparin (LOVENOX) injection  40 mg Subcutaneous Q24H   feeding supplement (PROSource TF20)  60 mL Per Tube Daily   Gerhardt's butt cream   Topical QID   insulin aspart  0-15 Units Subcutaneous Q4H   insulin aspart  3 Units Subcutaneous Q4H   insulin glargine-yfgn  15 Units Subcutaneous Daily   lidocaine  1 patch Transdermal Q24H   multivitamin with minerals  1 tablet Per Tube Daily   mouth rinse  15 mL Mouth Rinse 4 times per day   sertraline  25 mg Per Tube QPM   Continuous Infusions:  sodium chloride Stopped (08/28/22 1547)   sodium chloride 75 mL/hr at 09/06/22 2353   feeding supplement (JEVITY 1.5 CAL/FIBER) 1,000 mL (09/06/22 1501)   lacosamide (VIMPAT) IV 50 mg (09/07/22 1033)     LOS: 20 days    Time spent: 35 minutes    Ramiro Harvest, MD [facility]   To contact the attending provider between 7A-7P or the covering provider during after hours 7P-7A, please log into the web site [email or web address] and access using universal [place] password for that web site. If you do not have the password, please call the hospital operator.  09/07/2022, 12:09 PM

## 2022-09-07 NOTE — Progress Notes (Signed)
Subjective: Raymond Sampson, Per wife, he was up all night watching tv.   ROS: negative except above  Examination  Vital signs in last 24 hours: Temp:  [97.9 F (36.6 C)-98.9 F (37.2 C)] 97.9 F (36.6 C) (06/06 0733) Pulse Rate:  [71-79] 79 (06/06 0733) Resp:  [14-17] 17 (06/06 0733) BP: (121-153)/(65-81) 144/72 (06/06 0733) SpO2:  [95 %-98 %] 96 % (06/06 0733) Weight:  [65.3 kg] 65.3 kg (06/06 0500)  General: lying in bed, NAD Neuro: MS: Alert, oriented to person and place, time: May, followed commands  CN: pupils equal and reactive,  EOMI, face symmetric, tongue midline, normal sensation over face, Motor: 5/5 strength in all 4 extremities  Basic Metabolic Panel: Recent Labs  Lab 08/31/22 1525 09/03/22 0403 09/04/22 0742 09/05/22 0631 09/06/22 1020 09/07/22 0851  NA 132* 131*  --  135 135 134*  K 4.7 4.6  --  4.4 4.2 4.0  CL 98 99  --  101 102 103  CO2 24 25  --  24 25 23   GLUCOSE 149* 136*  --  164* 150* 176*  BUN 40* 51*  --  47* 55* 47*  CREATININE 0.80 0.82 0.86 0.86 0.82 0.67  CALCIUM 8.6* 8.5*  --  8.7* 8.8* 8.5*  MG  --   --   --   --  2.1  --   PHOS  --   --   --   --  3.3 2.9    CBC: Recent Labs  Lab 08/31/22 1525 09/03/22 0403 09/05/22 0631 09/06/22 1020 09/07/22 0851  WBC 11.6* 13.4* 12.4* 12.6* 10.4  NEUTROABS 8.3* 9.9* 9.2* 9.5*  --   HGB 10.9* 10.0* 10.2* 9.6* 10.2*  HCT 33.5* 31.0* 31.4* 30.4* 31.8*  MCV 94.4 93.7 94.0 94.4 94.4  PLT 408* 443* 443* 442* 431*     Coagulation Studies: No results for input(s): "LABPROT", "INR" in the last 72 hours.  Imaging No new brain imaging   ASSESSMENT AND PLAN: 82 year old male with right subdural hematoma secondary to fall now with seizures.    Traumatic subdural hematoma Provoked seizures -Seizures most likely secondary to underlying subdural hematoma   Recommendations - Continue Vimpat 50mg  BID, hesistant to stop as patient had frequent seizures - Discussed with wife about starting provigil or  amnatadine to promote wakefulness. Wife would like to wait till tomorrow to see if he continues to improve -Continue seizure precautions -As needed IV Versed for clinical seizures -Discussed plan with family at bedside and Dr.Thompson via secure chat   I have spent a total of  25 minutes with the patient reviewing hospital notes,  test results, labs and examining the patient as well as establishing an assessment and plan.  > 50% of time was spent in direct patient care.     Lindie Spruce Epilepsy [facility] For questions after 5pm please refer to AMION to reach the Neurologist on call

## 2022-09-07 NOTE — Progress Notes (Signed)
Occupational Therapy Treatment Patient Details Name: Raymond Sampson MRN: [id number] DOB: 10-29-1940 Today's Date: 09/07/2022   History of present illness 82 year old male admitted 08/18/22 after fall at home. CT head 5/18 revealed: large Right Subdural Hematoma, most pronounced along the anterior right frontal convexity.  Developed increased lethargy and change in pupilary size on 5/16 and MRI sowed multiple punctate areas of restricted diffusion in bilateral frontal and parietal cortex suspicious for tiny acute infarcts. LTM EEG positive for nonconvulsive seizures. Pt with history of CAD, prior stroke, hypertension, hyperlipidemia, type 2 diabetes, prior fall in March this year with SNF stay, chronic antiplatelet use with aspirin and Plavix.   OT comments  Pt progressing toward established OT goals. Mod +2 for bed mobility this session and able to static sit EOB ~20 minutes with min A progressing to min guard A, however, observed with R lateral lean and pt initially unable to correct. Max A to achieve lateral lean onto bent elbow on R (min A on L) and pt able to self initiate return to midline afterward with greater alignment. Min A to rise from R. Pt performing reaching this session and continues with undershooting on L and reports can see better with R eye covered, but denying double vision. Will continue to assess.    Recommendations for follow up therapy are one component of a multi-disciplinary discharge planning process, led by the attending physician.  Recommendations may be updated based on patient status, additional functional criteria and insurance authorization.    Assistance Recommended at Discharge Frequent or constant Supervision/Assistance  Patient can return home with the following  Two people to help with walking and/or transfers;Two people to help with bathing/dressing/bathroom;Help with stairs or ramp for entrance;Assist for transportation;Direct supervision/assist for financial  management;Assistance with feeding;Direct supervision/assist for medications management;Assistance with cooking/housework   Equipment Recommendations  Other (comment) (defer)    Recommendations for Other Services      Precautions / Restrictions Precautions Precautions: Fall Precaution Comments: Seizures; cortrak Restrictions Weight Bearing Restrictions: No       Mobility Bed Mobility Overal bed mobility: Needs Assistance Bed Mobility: Rolling, Sit to Sidelying, Sidelying to Sit Rolling: Mod assist Sidelying to sit: Mod assist, +2 for physical assistance   Sit to supine: Max assist, +2 for physical assistance, Total assist   General bed mobility comments: Multimodal cueing throughout    Transfers Overall transfer level: Needs assistance Equipment used: 2 person hand held assist Transfers: Sit to/from Stand Sit to Stand: Total assist, +2 physical assistance, +2 safety/equipment           General transfer comment: Max multimodal cues for anterior weight shift and total A +2 to rise     Balance Overall balance assessment: Needs assistance Sitting-balance support: Feet supported, Bilateral upper extremity supported, Single extremity supported Sitting balance-Leahy Scale: Poor   Postural control: Posterior lean Standing balance support: Bilateral upper extremity supported Standing balance-Leahy Scale: Zero Standing balance comment: unable to achieve standing                           ADL either performed or assessed with clinical judgement   ADL Overall ADL's : Needs assistance/impaired     Grooming: Wash/dry face;Min guard;Minimal assistance;Sitting Grooming Details (indicate cue type and reason): At EOB. Intermittent min A for balance.         Upper Body Dressing : Maximal assistance;Sitting Upper Body Dressing Details (indicate cue type and reason): to don new gown  Functional mobility during ADLs: +2 for physical  assistance;Total assistance;+2 for safety/equipment (unable to come to full stand with total  +2, but able to clear bottom from bed)      Extremity/Trunk Assessment              Vision   Additional Comments: Undershooting when reaching for therapists hand on L more signifiantly than R, but denying double vision regardless of how question is worded. Continue to assess   Perception     Praxis      Cognition Arousal/Alertness: Lethargic Behavior During Therapy: Flat affect Overall Cognitive Status: Impaired/Different from baseline Area of Impairment: Attention, Memory, Following commands, Safety/judgement, Problem solving, Awareness, Orientation                 Orientation Level: Disoriented to, Time Current Attention Level: Focused Memory: Decreased short-term memory Following Commands: Follows one step commands inconsistently, Follows one step commands with increased time Safety/Judgement: Decreased awareness of safety, Decreased awareness of deficits Awareness: Intellectual Problem Solving: Slow processing, Decreased initiation, Difficulty sequencing, Requires verbal cues, Requires tactile cues General Comments: Fatigues quickly, requires encouragement during mobility due to decr awareness and fear of falling greater than actual risk. Decr attention to L when perofmring reaching, visual tasks, etc        Exercises Exercises: Other exercises Other Exercises Other Exercises: static and dynamic sitting EOB >20 min Other Exercises: Bil UE reaching at EOB to reach therapist's hand within base of support with support of only 1 UE on bed. Other Exercises: Lateral lean onto bent elbow sititng EOB to L and R. pt with L lateral lean throughout session, but after leaning R (Max faciklitation with pt fearful of falling), greater ability to achieve midline.    Shoulder Instructions       General Comments      Pertinent Vitals/ Pain       Pain Assessment Pain Assessment:  Faces Faces Pain Scale: Hurts little more Pain Location: generalized Pain Descriptors / Indicators: Guarding, Grimacing, Discomfort Pain Intervention(s): Limited activity within patient's tolerance, Monitored during session  Home Living                                          Prior Functioning/Environment              Frequency  Min 2X/week        Progress Toward Goals  OT Goals(current goals can now be found in the care plan section)  Progress towards OT goals: Progressing toward goals  Acute Rehab OT Goals Patient Stated Goal: eat OT Goal Formulation: With patient Time For Goal Achievement: 09/19/22 Potential to Achieve Goals: Fair ADL Goals Pt Will Perform Grooming: with mod assist;bed level Additional ADL Goal #1: Pt will follow 1 step commands with 50% accuracy given increased time. Additional ADL Goal #2: Pt will complete bed mobility with max assist as precursor to ADLs.  Plan Discharge plan remains appropriate;Frequency remains appropriate    Co-evaluation                 AM-PAC OT "6 Clicks" Daily Activity     Outcome Measure   Help from another person eating meals?: Total Help from another person taking care of personal grooming?: A Lot Help from another person toileting, which includes using toliet, bedpan, or urinal?: Total Help from another person bathing (including washing, rinsing, drying)?: Total Help  from another person to put on and taking off regular upper body clothing?: A Lot Help from another person to put on and taking off regular lower body clothing?: Total 6 Click Score: 8    End of Session Equipment Utilized During Treatment: Gait belt  OT Visit Diagnosis: Other abnormalities of gait and mobility (R26.89);Muscle weakness (generalized) (M62.81);Other symptoms and signs involving cognitive function;Other symptoms and signs involving the nervous system (R29.898)   Activity Tolerance Patient limited by lethargy    Patient Left in bed;with call bell/phone within reach;with bed alarm set;with restraints reapplied;with SCD's reapplied   Nurse Communication Mobility status        Time: 4098-1191 OT Time Calculation (min): 33 min  Charges: OT General Charges $OT Visit: 1 Visit OT Treatments $Self Care/Home Management : 8-22 mins $Therapeutic Activity: 8-22 mins  Myrla Halsted, OTD, OTR/L [facility] Acute Rehabilitation Office: [phone]   Myrla Halsted 09/07/2022, 5:43 PM

## 2022-09-07 NOTE — Progress Notes (Signed)
Patient ID: Raymond Sampson, male   DOB: May 31, 1940, 82 y.o.   MRN: [id number]    Progress Note from the Palliative Medicine Team at [facility]   Patient Name: Raymond Sampson        Date: 09/07/2022 DOB: 12-24-1940  Age: 82 y.o. MRN#: [id number] Attending Physician: Rodolph Bong, MD Primary Care Physician: Gracelyn Nurse, MD Admit Date: 08/18/2022   Medical records reviewed   82 y.o. male  with past medical history of CAD, stroke, DM2, HTN, HLD admitted on 08/18/2022 after a fall at his home with a large right-sided subdural hematoma. He was initially alert and oriented but on 08/21/22 he he became more drowsy and was only able to intermittently follow commands and intermittently answer questions. EEG to evaluate for seizure.   Initial palliative medicine consult on 08/22/2022  Today is day 20 of this hospitalization.  Patient and family face ongoing treatment option decisions, advance directive decisions and anticipatory care needs.   This NP assessed patient at the bedside as a follow up for palliative medicine needs and emotional support.  Wife at bedside.  Patient remains lethargic, core track in place for nutritional support.  Patient stated in the past that he does not wish to have a PEG tube  Continued education regarding current medical situation specific to patient's ability to support himself from a nutrition and hydration standpoint.             Offered the importance of continued reassessment of patient's medical situation and ability to again support himself from a nutritional standpoint.  Explored concept of adult failure to thrive.  Wife understands, she is is trying hard to encourage p.o. intake.  She remains hopeful for improvement.  Will continue to follow over the next several days for increased alertness and increase oral intake.  Education offered today regarding  the importance of continued conversation with family members and the  medical providers  regarding overall plan of care and treatment options,  ensuring decisions are within the context of the patients values and GOCs.  Questions and concerns addressed   Discussed with Dr Janee Morn  PMT provider will follow-up on Monday for ongoing palliative needs and social support.  Call PMT phone with questions or concerns in the interim   Time:  50 minutes  Detailed review of medical records ( labs, imaging, vital signs), medically appropriate exam ( MS, skin, resp)   discussed with treatment team, counseling and education to patient, family, staff, documenting clinical information, medication management, coordination of care    Lorinda Creed NP  Palliative Medicine Team Team Phone # [phone] Pager [phone]

## 2022-09-07 NOTE — TOC Benefit Eligibility Note (Signed)
Patient Product/process development scientist completed.    The patient is currently admitted and upon discharge could be taking modafinil (Provigel) 100 mg tablets.  Requires Prior Authorization  The patient is insured through [employer] Part D   This test claim was processed through [facility]- copay amounts may vary at other pharmacies due to pharmacy/plan contracts, or as the patient moves through the different stages of their insurance plan.  Roland Earl, CPHT Pharmacy Patient Advocate Specialist [facility] Patient Advocate Team Direct Number: [phone]  Fax: [phone]

## 2022-09-07 NOTE — Progress Notes (Signed)
Cardiac Individual Treatment Plan  Patient Details  Name: JEMARIO SCHWALBACH MRN: [id number] Date of Birth: 03-06-1941 Referring Provider:   Flowsheet Row Cardiac Rehab from 05/17/2022 in [facility] Cardiac and Pulmonary Rehab  Referring Provider Dr Yvonne Kendall, MD       Initial Encounter Date:  Flowsheet Row Cardiac Rehab from 05/17/2022 in [facility] Cardiac and Pulmonary Rehab  Date 05/17/22       Visit Diagnosis: NSTEMI (non-ST elevation myocardial infarction) [facility])  Status post coronary artery stent placement  Patient's Home Medications on Admission: No current facility-administered medications for this visit. No current outpatient medications on file.  Facility-Administered Medications Ordered in Other Visits:    0.9 %  sodium chloride infusion, , Intravenous, PRN, Candelaria Stagers T, MD, Stopped at 08/28/22 1547   0.9 %  sodium chloride infusion, , Intravenous, Continuous, Rodolph Bong, MD, Last Rate: 75 mL/hr at 09/06/22 2353, Infusion Verify at 09/06/22 2353   acetaminophen (TYLENOL) 160 MG/5ML solution 650 mg, 650 mg, Per Tube, Q6H PRN, Candelaria Stagers T, MD, 650 mg at 09/06/22 1707   [DISCONTINUED] acetaminophen (TYLENOL) tablet 650 mg, 650 mg, Oral, Q4H PRN, 650 mg at 08/20/22 2115 **OR** acetaminophen (TYLENOL) suppository 650 mg, 650 mg, Rectal, Q4H PRN, Cosentino, Allison R, PA-C   amLODipine (NORVASC) tablet 10 mg, 10 mg, Per Tube, Daily, Gonfa, Taye T, MD, 10 mg at 09/06/22 4098   aspirin chewable tablet 81 mg, 81 mg, Per Tube, Daily, Gonfa, Taye T, MD, 81 mg at 09/06/22 0833   atorvastatin (LIPITOR) tablet 80 mg, 80 mg, Per Tube, Daily, Gonfa, Taye T, MD, 80 mg at 09/06/22 0833   carvedilol (COREG) tablet 12.5 mg, 12.5 mg, Per Tube, BID WC, Gonfa, Taye T, MD, 12.5 mg at 09/06/22 1629   clonazePAM (KLONOPIN) disintegrating tablet 2 mg, 2 mg, Oral, PRN, Charlsie Quest, MD   clopidogrel (PLAVIX) tablet 75 mg, 75 mg, Per Tube, Daily, Gonfa, Taye T, MD, 75 mg at 09/06/22 0833    enoxaparin (LOVENOX) injection 40 mg, 40 mg, Subcutaneous, Q24H, Gonfa, Taye T, MD, 40 mg at 09/06/22 0833   feeding supplement (JEVITY 1.5 CAL/FIBER) liquid 1,000 mL, 1,000 mL, Per Tube, Continuous, Gonfa, Taye T, MD, Last Rate: 45 mL/hr at 09/06/22 1501, 1,000 mL at 09/06/22 1501   feeding supplement (PROSource TF20) liquid 60 mL, 60 mL, Per Tube, BID, Agarwala, Ravi, MD, 60 mL at 09/06/22 2130   Gerhardt's butt cream, , Topical, QID, Kathlen Mody, MD, Given at 09/06/22 2130   guaiFENesin-dextromethorphan (ROBITUSSIN DM) 100-10 MG/5ML syrup 10 mL, 10 mL, Oral, Q4H PRN, Blake Divine, Vijaya, MD   insulin aspart (novoLOG) injection 0-15 Units, 0-15 Units, Subcutaneous, Q4H, Gonfa, Taye T, MD, 2 Units at 09/07/22 0548   insulin aspart (novoLOG) injection 3 Units, 3 Units, Subcutaneous, Q4H, Akula, Vijaya, MD, 3 Units at 09/07/22 0548   insulin glargine-yfgn (SEMGLEE) injection 15 Units, 15 Units, Subcutaneous, Daily, Gonfa, Taye T, MD, 15 Units at 09/06/22 0833   labetalol (NORMODYNE) injection 10 mg, 10 mg, Intravenous, Q10 min PRN, Gonfa, Taye T, MD   lacosamide (VIMPAT) 50 mg in sodium chloride 0.9 % 25 mL IVPB, 50 mg, Intravenous, Q12H, Charlsie Quest, MD, Stopped at 09/06/22 2217   lidocaine (LIDODERM) 5 % 1 patch, 1 patch, Transdermal, Q24H, Gonfa, Taye T, MD, 1 patch at 09/06/22 1146   multivitamin with minerals tablet 1 tablet, 1 tablet, Per Tube, Daily, Lynnell Catalan, MD, 1 tablet at 09/06/22 0833   [DISCONTINUED] ondansetron (ZOFRAN) tablet 4  mg, 4 mg, Oral, Q4H PRN **OR** ondansetron (ZOFRAN) injection 4 mg, 4 mg, Intravenous, Q4H PRN, Cosentino, Talmadge Chad, PA-C   Oral care mouth rinse, 15 mL, Mouth Rinse, 4 times per day, Kathlen Mody, MD, 15 mL at 09/06/22 2130   Oral care mouth rinse, 15 mL, Mouth Rinse, PRN, Kathlen Mody, MD   sertraline (ZOLOFT) tablet 25 mg, 25 mg, Per Tube, QPM, Alanda Slim, Taye T, MD, 25 mg at 09/06/22 1629  Past Medical History: Past Medical History:  Diagnosis Date    Arthritic-like pain    CAD (coronary artery disease)    a. apical MI 5/07 with LHC showing 50% pCFX, 30% pRCA, 99% pLAD, 99% mLAD, 75% dLAD (cypher DES x3 to LAD); b. ETT-myoview (12/08) 81% MPHR,  EF 64%, partially reversible inferoapical perfusion defect similar to prior study 12/07; c. 04/2016 MV: EF 59%, fixed anteroapical defect, no ischemia->Low risk.   Carotid arterial disease (HCC)    a. 05/2016 Carotid U/S: 30-40% bilat dzs, f/u in 2 yrs.   Chest pain 04/13/2022   CVA (cerebral vascular accident) [facility])    a. 07/2010 - h/o CVA/TIA.   Diet-controlled type 2 diabetes mellitus (HCC)    HLD (hyperlipidemia)    HTN (hypertension)    PAT (paroxysmal atrial tachycardia)    a. 06/2016 Event monitor:  rare episodes of atrial tachycardia, longest 7 beats. No afib.    Tobacco Use: Social History   Tobacco Use  Smoking Status Former   Packs/day: 1.00   Years: 25.00   Additional pack years: 0.00   Total pack years: 25.00   Types: Cigarettes   Quit date: 04/17/1985   Years since quitting: 37.4  Smokeless Tobacco Never    Labs: Review Flowsheet  More data exists      Latest Ref Rng & Units 05/29/2019 04/12/2022 04/13/2022 07/02/2022 08/21/2022  Labs for ITP Cardiac and Pulmonary Rehab  Cholestrol 0 - 200 mg/dL 284  - 132  - -  LDL (calc) 0 - 99 mg/dL 64  - 440  - -  Direct LDL 0 - 99 mg/dL - - - - 65   HDL-C >10 mg/dL 34  - 40  - -  Trlycerides <150 mg/dL 272  - 536  - -  Hemoglobin A1c 4.8 - 5.6 % - 6.8  - - 6.0   PH, Arterial 7.35 - 7.45 - - - 7.47  C -  PCO2 arterial 32 - 48 mmHg - - - 40  C -  Bicarbonate 20.0 - 28.0 mmol/L - - - 29.1  C -  O2 Saturation % - - - 97.1  C -    Details      C Corrected result          Exercise Target Goals: Exercise Program Goal: Individual exercise prescription set using results from initial 6 min walk test and THRR while considering  patient's activity barriers and safety.   Exercise Prescription Goal: Initial exercise prescription  builds to 30-45 minutes a day of aerobic activity, 2-3 days per week.  Home exercise guidelines will be given to patient during program as part of exercise prescription that the participant will acknowledge.   Education: Aerobic Exercise: - Group verbal and visual presentation on the components of exercise prescription. Introduces F.I.T.T principle from ACSM for exercise prescriptions.  Reviews F.I.T.T. principles of aerobic exercise including progression. Written material given at graduation. Flowsheet Row Cardiac Rehab from 06/14/2022 in [facility] Cardiac and Pulmonary Rehab  Education need identified 05/17/22  Education: Resistance Exercise: - Group verbal and visual presentation on the components of exercise prescription. Introduces F.I.T.T principle from ACSM for exercise prescriptions  Reviews F.I.T.T. principles of resistance exercise including progression. Written material given at graduation.    Education: Exercise & Equipment Safety: - Individual verbal instruction and demonstration of equipment use and safety with use of the equipment. Flowsheet Row Cardiac Rehab from 06/14/2022 in [facility] Cardiac and Pulmonary Rehab  Date 05/17/22  Educator NT  Instruction Review Code 1- Verbalizes Understanding       Education: Exercise Physiology & General Exercise Guidelines: - Group verbal and written instruction with models to review the exercise physiology of the cardiovascular system and associated critical values. Provides general exercise guidelines with specific guidelines to those with heart or lung disease.    Education: Flexibility, Balance, Mind/Body Relaxation: - Group verbal and visual presentation with interactive activity on the components of exercise prescription. Introduces F.I.T.T principle from ACSM for exercise prescriptions. Reviews F.I.T.T. principles of flexibility and balance exercise training including progression. Also discusses the mind body connection.  Reviews  various relaxation techniques to help reduce and manage stress (i.e. Deep breathing, progressive muscle relaxation, and visualization). Balance handout provided to take home. Written material given at graduation.   Activity Barriers & Risk Stratification:  Activity Barriers & Cardiac Risk Stratification - 05/17/22 1538       Activity Barriers & Cardiac Risk Stratification   Activity Barriers None    Cardiac Risk Stratification High             6 Minute Walk:  6 Minute Walk     Row Name 05/17/22 1533         6 Minute Walk   Phase Initial     Distance 815 feet     Walk Time 5.78 minutes     # of Rest Breaks 2     MPH 1.54     METS 2.02     RPE 12     Perceived Dyspnea  0     VO2 Peak 7.06     Symptoms No     Resting HR 67 bpm     Resting BP 142/60     Resting Oxygen Saturation  100 %     Exercise Oxygen Saturation  during 6 min walk 95 %     Max Ex. HR 96 bpm     Max Ex. BP 172/74     2 Minute Post BP 160/68              Oxygen Initial Assessment:   Oxygen Re-Evaluation:   Oxygen Discharge (Final Oxygen Re-Evaluation):   Initial Exercise Prescription:  Initial Exercise Prescription - 05/17/22 1500       Date of Initial Exercise RX and Referring Provider   Date 05/17/22    Referring Provider Dr Yvonne Kendall, MD      Oxygen   Maintain Oxygen Saturation 88% or higher      Treadmill   MPH 1.2    Grade 0    Minutes 15    METs 1.92      Recumbant Bike   Level 1    RPM 50    Watts 15    Minutes 15    METs 2.02      NuStep   Level 1    SPM 80    Minutes 15    METs 2.02      Prescription Details   Frequency (times per week) 3  Duration Progress to 30 minutes of continuous aerobic without signs/symptoms of physical distress      Intensity   THRR 40-80% of Max Heartrate 95-124    Ratings of Perceived Exertion 11-13    Perceived Dyspnea 0-4      Progression   Progression Continue to progress workloads to maintain intensity  without signs/symptoms of physical distress.      Resistance Training   Training Prescription Yes    Weight 3 lb    Reps 10-15             Perform Capillary Blood Glucose checks as needed.  Exercise Prescription Changes:   Exercise Prescription Changes     Row Name 05/17/22 1500 06/01/22 1400 06/13/22 1300 06/14/22 1300 06/29/22 1300     Response to Exercise   Blood Pressure (Admit) 142/60 110/64 132/62 -- 142/68   Blood Pressure (Exercise) 172/74 140/70 128/70 -- 128/70   Blood Pressure (Exit) 160/68 104/66 122/62 -- 118/60   Heart Rate (Admit) 67 bpm 64 bpm 69 bpm -- 68 bpm   Heart Rate (Exercise) 96 bpm 111 bpm 94 bpm -- 87 bpm   Heart Rate (Exit) 70 bpm 76 bpm 69 bpm -- 68 bpm   Oxygen Saturation (Admit) 100 % -- -- -- --   Oxygen Saturation (Exercise) 95 % -- -- -- --   Rating of Perceived Exertion (Exercise) 12 14 15  -- 12   Perceived Dyspnea (Exercise) 0 -- -- -- --   Symptoms none none none -- none   Comments Results First three days of exercise -- -- --   Duration -- Progress to 30 minutes of  aerobic without signs/symptoms of physical distress Progress to 30 minutes of  aerobic without signs/symptoms of physical distress -- Progress to 30 minutes of  aerobic without signs/symptoms of physical distress   Intensity -- THRR unchanged THRR unchanged -- THRR unchanged     Progression   Progression -- Continue to progress workloads to maintain intensity without signs/symptoms of physical distress. Continue to progress workloads to maintain intensity without signs/symptoms of physical distress. -- Continue to progress workloads to maintain intensity without signs/symptoms of physical distress.   Average METs -- 1.4 1.57 -- 1.8     Resistance Training   Training Prescription -- Yes Yes -- Yes   Weight -- 3 lb 3 lb -- 3 lb   Reps -- 10-15 10-15 -- 10-15     Interval Training   Interval Training -- No No -- No     Treadmill   MPH -- -- 0.7 -- --   Grade -- -- 0  -- --   Minutes -- -- 15 -- --   METs -- -- 1.5 -- --     NuStep   Level -- 1 3 -- 3   Minutes -- 15 15 -- 30   METs -- 1.8 1.8 -- 1.8     T5 Nustep   Level -- 1 -- -- --   Minutes -- 15 -- -- --   METs -- 1.7 -- -- --     Biostep-RELP   Level -- 1 1 -- 1   Minutes -- 15 15 -- 15   METs -- 1 -- -- 2     Track   Laps -- 7 6 -- --   Minutes -- 15 15 -- --   METs -- 1.38 1.33 -- --     Home Exercise Plan   Plans to continue exercise at -- -- --  Home (comment)  walking Home (comment)  walking   Frequency -- -- -- Add 2 additional days to program exercise sessions. Add 2 additional days to program exercise sessions.   Initial Home Exercises Provided -- -- -- 06/14/22 06/14/22     Oxygen   Maintain Oxygen Saturation -- 88% or higher 88% or higher -- 88% or higher            Exercise Comments:   Exercise Comments     Row Name 05/22/22 1336           Exercise Comments First full day of exercise!  Patient was oriented to gym and equipment including functions, settings, policies, and procedures.  Patient's individual exercise prescription and treatment plan were reviewed.  All starting workloads were established based on the results of the 6 minute walk test done at initial orientation visit.  The plan for exercise progression was also introduced and progression will be customized based on patient's performance and goals.                Exercise Goals and Review:   Exercise Goals     Row Name 05/17/22 1538             Exercise Goals   Increase Physical Activity Yes       Intervention Provide advice, education, support and counseling about physical activity/exercise needs.;Develop an individualized exercise prescription for aerobic and resistive training based on initial evaluation findings, risk stratification, comorbidities and participant's personal goals.       Expected Outcomes Short Term: Attend rehab on a regular basis to increase amount of physical  activity.;Long Term: Add in home exercise to make exercise part of routine and to increase amount of physical activity.;Long Term: Exercising regularly at least 3-5 days a week.       Increase Strength and Stamina Yes       Intervention Develop an individualized exercise prescription for aerobic and resistive training based on initial evaluation findings, risk stratification, comorbidities and participant's personal goals.;Provide advice, education, support and counseling about physical activity/exercise needs.       Expected Outcomes Short Term: Perform resistance training exercises routinely during rehab and add in resistance training at home;Long Term: Improve cardiorespiratory fitness, muscular endurance and strength as measured by increased METs and functional capacity ( );Short Term: Increase workloads from initial exercise prescription for resistance, speed, and METs.       Able to understand and use rate of perceived exertion (RPE) scale Yes       Intervention Provide education and explanation on how to use RPE scale       Expected Outcomes Short Term: Able to use RPE daily in rehab to express subjective intensity level;Long Term:  Able to use RPE to guide intensity level when exercising independently       Able to understand and use Dyspnea scale Yes       Intervention Provide education and explanation on how to use Dyspnea scale       Expected Outcomes Short Term: Able to use Dyspnea scale daily in rehab to express subjective sense of shortness of breath during exertion;Long Term: Able to use Dyspnea scale to guide intensity level when exercising independently       Knowledge and understanding of Target Heart Rate Range (THRR) Yes       Intervention Provide education and explanation of THRR including how the numbers were predicted and where they are located for reference  Expected Outcomes Long Term: Able to use THRR to govern intensity when exercising independently;Short Term: Able to  state/look up THRR;Short Term: Able to use daily as guideline for intensity in rehab       Able to check pulse independently Yes       Intervention Review the importance of being able to check your own pulse for safety during independent exercise;Provide education and demonstration on how to check pulse in carotid and radial arteries.       Expected Outcomes Short Term: Able to explain why pulse checking is important during independent exercise;Long Term: Able to check pulse independently and accurately       Understanding of Exercise Prescription Yes       Intervention Provide education, explanation, and written materials on patient's individual exercise prescription       Expected Outcomes Short Term: Able to explain program exercise prescription;Long Term: Able to explain home exercise prescription to exercise independently                Exercise Goals Re-Evaluation :  Exercise Goals Re-Evaluation     Row Name 05/22/22 1337 05/31/22 1357 06/01/22 1528 06/13/22 1354 06/14/22 1345     Exercise Goal Re-Evaluation   Exercise Goals Review Increase Physical Activity;Able to understand and use rate of perceived exertion (RPE) scale;Knowledge and understanding of Target Heart Rate Range (THRR);Understanding of Exercise Prescription;Increase Strength and Stamina;Able to understand and use Dyspnea scale Increase Physical Activity;Increase Strength and Stamina;Understanding of Exercise Prescription Increase Physical Activity;Increase Strength and Stamina;Understanding of Exercise Prescription Increase Physical Activity;Increase Strength and Stamina;Understanding of Exercise Prescription Increase Physical Activity;Increase Strength and Stamina;Understanding of Exercise Prescription;Able to understand and use rate of perceived exertion (RPE) scale;Knowledge and understanding of Target Heart Rate Range (THRR);Able to understand and use Dyspnea scale;Able to check pulse independently   Comments Reviewed  RPE scale, THR and program prescription with pt today.  Pt voiced understanding and was given a copy of goals to take home. Larz is doing well. Stafff will hold off on going over home exercise as patient just started the program. Explained the importance of starting home exercise and staying compliant with it during rehab for positive results. Encouraged patient to always have a buddy with him with exercise, he states his wife will be with him and they plan on taking walks together. He does not use a cane or walker. Discussed importance checking HR at home as well. Rondie is off to a good start in the program. He had an average MET level of 1.4 METs during his first week in the program. He also did level one on the T4 and T5 nustep as well as the biostep. He was only able to walk the track for 10 minutes and got seven laps. We will continue to monitor his progress in the program. Pason continues to do well in rehab. He tried out the treadmill and was able to walk at a 0.7 mph speed. He also increased to level 3 on the T4 Nustep! He has been hitting his THR only some sessions. We hope to see his walking improve over time. RPEs are appropriate. Will continue to monitor. Reviewed home exercise with pt today.  Pt plans to walk around yard at home for exercise.  Reviewed THR, pulse, RPE, sign and symptoms, pulse oximetery and when to call 911 or MD.  Also discussed weather considerations and indoor options.  Pt voiced understanding.   Expected Outcomes Short: Use RPE daily to regulate intensity.  Long: Follow program prescription in THR. ShorT: EP to go over home exercise Long: Exercise independently at home at appropriate prescription Short: Continue to work towards 20 minutes of walking. Long: Continue to follow exercise prescription. Short: Increase laps on track, fulfill 20 minutes of walking all together Long: Continue to increase overall MET level and stamina Short: Start to add in more walking at home Long:  continue to exercise independently    Row Name 06/29/22 1355 07/12/22 1650 07/25/22 0810 08/08/22 1552 08/22/22 1404     Exercise Goal Re-Evaluation   Exercise Goals Review Increase Physical Activity;Increase Strength and Stamina;Understanding of Exercise Prescription Increase Physical Activity;Increase Strength and Stamina;Understanding of Exercise Prescription Increase Physical Activity;Increase Strength and Stamina;Understanding of Exercise Prescription Increase Physical Activity;Increase Strength and Stamina;Understanding of Exercise Prescription Increase Physical Activity;Increase Strength and Stamina;Understanding of Exercise Prescription   Comments Yasin is doing well in the program. He only attended rehab twice since the last review due to him taking a fall at home and having to call out. During his last two sessions he was able to increase his overall MET level to 1.8 METs. He also was able to do 30 minutes on the T4 nustep at level 3. We will encourage him to try some walking when he returns to the program. We will continue to monitor his progress in the program. Javarius has not been in the program since last review. He has been placed on a medical hold as he had a fall and is now completing PT. We will continue to receive updates from patient. Cavon has not attended rehab since 06/14/2022. He has been placed on a medical hold as he had a fall and is now completing PT. PT's wife recently gave update that he continues to improve and is looking forward to going home. We will continue to receive updates from patient. Marley has not been here since 3/13. He was discharge from SNF but is now receiving home health OT and PT. Him and his wife are aware he needs to finish that until he returns to use at rehab with clearance. Nevil has not been here since 3/13. He was discharge from SNF but is now receiving home health OT and PT. Him and his wife are aware he needs to finish that until he returns to [place] at  rehab with clearance. We will continue to monitor his progress when he returns to the program.   Expected Outcomes Short: Return to regular attendance in rehab when ready. Long: Continue to improve strength and stamina. Short: Return to rehab when cleared Long: Graduate from [employer]: Return to rehab when cleared. Long: Graduate from [employer]. Short: Finish OT/PT Long: Graduate from [employer] Short: Finish OT/PT Long: Graduate from [employer] Name 09/07/22 0803             Exercise Goal Re-Evaluation   Exercise Goals Review Increase Physical Activity;Increase Strength and Stamina;Understanding of Exercise Prescription                Discharge Exercise Prescription (Final Exercise Prescription Changes):  Exercise Prescription Changes - 06/29/22 1300       Response to Exercise   Blood Pressure (Admit) 142/68    Blood Pressure (Exercise) 128/70    Blood Pressure (Exit) 118/60    Heart Rate (Admit) 68 bpm    Heart Rate (Exercise) 87 bpm    Heart Rate (Exit) 68 bpm    Rating of Perceived Exertion (Exercise) 12    Symptoms none  Duration Progress to 30 minutes of  aerobic without signs/symptoms of physical distress    Intensity THRR unchanged      Progression   Progression Continue to progress workloads to maintain intensity without signs/symptoms of physical distress.    Average METs 1.8      Resistance Training   Training Prescription Yes    Weight 3 lb    Reps 10-15      Interval Training   Interval Training No      NuStep   Level 3    Minutes 30    METs 1.8      Biostep-RELP   Level 1    Minutes 15    METs 2      Home Exercise Plan   Plans to continue exercise at Home (comment)   walking   Frequency Add 2 additional days to program exercise sessions.    Initial Home Exercises Provided 06/14/22      Oxygen   Maintain Oxygen Saturation 88% or higher             Nutrition:  Target Goals: Understanding of nutrition guidelines, daily  intake of sodium 1500mg , cholesterol 200mg , calories 30% from fat and 7% or less from saturated fats, daily to have 5 or more servings of fruits and vegetables.  Education: All About Nutrition: -Group instruction provided by verbal, written material, interactive activities, discussions, models, and posters to present general guidelines for heart healthy nutrition including fat, fiber, MyPlate, the role of sodium in heart healthy nutrition, utilization of the nutrition label, and utilization of this knowledge for meal planning. Follow up email sent as well. Written material given at graduation.   Biometrics:  Pre Biometrics - 05/17/22 1538       Pre Biometrics   Height 5' 8.5" (1.74 m)    Weight 152 lb (68.9 kg)    Waist Circumference 36 inches    Hip Circumference 36 inches    Waist to Hip Ratio 1 %    BMI (Calculated) 22.77    Single Leg Stand 1.6 seconds              Nutrition Therapy Plan and Nutrition Goals:  Nutrition Therapy & Goals - 05/31/22 1352       Nutrition Therapy   RD appointment deferred Yes   Deferred            Nutrition Assessments:  MEDIFICTS Score Key: ?70 Need to make dietary changes  40-70 Heart Healthy Diet ? 40 Therapeutic Level Cholesterol Diet  Flowsheet Row Cardiac Rehab from 05/17/2022 in [facility] Cardiac and Pulmonary Rehab  Picture Your Plate Total Score on Admission 56      Picture Your Plate Scores: <16 Unhealthy dietary pattern with much room for improvement. 41-50 Dietary pattern unlikely to meet recommendations for good health and room for improvement. 51-60 More healthful dietary pattern, with some room for improvement.  >60 Healthy dietary pattern, although there may be some specific behaviors that could be improved.    Nutrition Goals Re-Evaluation:  Nutrition Goals Re-Evaluation     Row Name 05/31/22 1410             Goals   Nutrition Goal Patient deferred nutrition at this time.                Nutrition  Goals Discharge (Final Nutrition Goals Re-Evaluation):  Nutrition Goals Re-Evaluation - 05/31/22 1410       Goals   Nutrition Goal Patient deferred nutrition at this  time.             Psychosocial: Target Goals: Acknowledge presence or absence of significant depression and/or stress, maximize coping skills, provide positive support system. Participant is able to verbalize types and ability to use techniques and skills needed for reducing stress and depression.   Education: Stress, Anxiety, and Depression - Group verbal and visual presentation to define topics covered.  Reviews how body is impacted by stress, anxiety, and depression.  Also discusses healthy ways to reduce stress and to treat/manage anxiety and depression.  Written material given at graduation. Flowsheet Row Cardiac Rehab from 06/14/2022 in [facility] Cardiac and Pulmonary Rehab  Date 06/14/22  Educator KW  Instruction Review Code 1- [employer] Understanding       Education: Sleep Hygiene -Provides group verbal and written instruction about how sleep can affect your health.  Define sleep hygiene, discuss sleep cycles and impact of sleep habits. Review good sleep hygiene tips.    Initial Review & Psychosocial Screening:  Initial Psych Review & Screening - 05/11/22 1358       Initial Review   Current issues with Current Psychotropic Meds;Current Anxiety/Panic      Family Dynamics   Good Support System? Yes   wife,daughter in law,   all children   Comments would have anxiety      Barriers   Psychosocial barriers to participate in program There are no identifiable barriers or psychosocial needs.      Screening Interventions   Interventions Encouraged to exercise;To provide support and resources with identified psychosocial needs;Provide feedback about the scores to participant    Expected Outcomes Short Term goal: Utilizing psychosocial counselor, staff and physician to assist with identification of specific  Stressors or current issues interfering with healing process. Setting desired goal for each stressor or current issue identified.;Long Term Goal: Stressors or current issues are controlled or eliminated.;Short Term goal: Identification and review with participant of any Quality of Life or Depression concerns found by scoring the questionnaire.;Long Term goal: The participant improves quality of Life and PHQ9 Scores as seen by post scores and/or verbalization of changes             Quality of Life Scores:   Quality of Life - 05/17/22 1526       Quality of Life   Select Quality of Life      Quality of Life Scores   Health/Function Pre 17.93 %    Socioeconomic Pre 22.75 %    Psych/Spiritual Pre 24.21 %    Family Pre 24.9 %    GLOBAL Pre 21.2 %            Scores of 19 and below usually indicate a poorer quality of life in these areas.  A difference of  2-3 points is a clinically meaningful difference.  A difference of 2-3 points in the total score of the Quality of Life Index has been associated with significant improvement in overall quality of life, self-image, physical symptoms, and general health in studies assessing change in quality of life.  PHQ-9: Review Flowsheet       05/17/2022  Depression screen PHQ 2/9  Decreased Interest 0  Down, Depressed, Hopeless 0  PHQ - 2 Score 0  Altered sleeping 1  Tired, decreased energy 0  Change in appetite 0  Feeling bad or failure about yourself  0  Trouble concentrating 1  Moving slowly or fidgety/restless 0  Suicidal thoughts 0  PHQ-9 Score 2  Difficult doing work/chores  Not difficult at all   Interpretation of Total Score  Total Score Depression Severity:  1-4 = Minimal depression, 5-9 = Mild depression, 10-14 = Moderate depression, 15-19 = Moderately severe depression, 20-27 = Severe depression   Psychosocial Evaluation and Intervention:  Psychosocial Evaluation - 05/11/22 1413       Psychosocial Evaluation &  Interventions   Interventions Encouraged to exercise with the program and follow exercise prescription    Comments Jerme has no barriers to attending the program. He lives with his wife,[name],of 30 years. She and their children are his support.  He does take Zoloft for history of anxiety. He would worry about others and get stressed.  He does have some short term memory concerns;diagnosis of mild dementia. He is ready to start the program and build up strength and energy.    Expected Outcomes STG Taden is able to attend all scheduled sessions. He is able to progress with his exercise  He is able to reduce any anxieties he has. LTG Colsten continues his exercise progression and keep anxiety reduced    Continue Psychosocial Services  Follow up required by staff             Psychosocial Re-Evaluation:  Psychosocial Re-Evaluation     Row Name 05/31/22 1348             Psychosocial Re-Evaluation   Current issues with None Identified       Comments Dezmin states he keeps busy- he has 2 children, 2 greatgranchildren, and a handful of gradnchildren. HE states his sleep is good and does not report any issues with it. His wife is good support and takes care of him with cooking, medications, etc. He is very grateful for her. Denies big stressors. He likes to watch TV to help with stress, though denies currently experiencing any. Still taking Zolofts which works well. Per notes, he experiences some mild dementia. He states he has been enjoying the program so far and is excited to see how his results turn out after finishing. Denies any other concerns at this time.       Expected Outcomes Short: Continue coming to rehab for mood boost Long: Continue to maintain positive attitude and utilize exercise for stress/anxiety management       Interventions Encouraged to attend Cardiac Rehabilitation for the exercise       Continue Psychosocial Services  Follow up required by staff                 Psychosocial Discharge (Final Psychosocial Re-Evaluation):  Psychosocial Re-Evaluation - 05/31/22 1348       Psychosocial Re-Evaluation   Current issues with None Identified    Comments Adelfo states he keeps busy- he has 2 children, 2 greatgranchildren, and a handful of gradnchildren. HE states his sleep is good and does not report any issues with it. His wife is good support and takes care of him with cooking, medications, etc. He is very grateful for her. Denies big stressors. He likes to watch TV to help with stress, though denies currently experiencing any. Still taking Zolofts which works well. Per notes, he experiences some mild dementia. He states he has been enjoying the program so far and is excited to see how his results turn out after finishing. Denies any other concerns at this time.    Expected Outcomes Short: Continue coming to rehab for mood boost Long: Continue to maintain positive attitude and utilize exercise for stress/anxiety management    Interventions Encouraged  to attend Cardiac Rehabilitation for the exercise    Continue Psychosocial Services  Follow up required by staff             Vocational Rehabilitation: Provide vocational rehab assistance to qualifying candidates.   Vocational Rehab Evaluation & Intervention:  Vocational Rehab - 05/11/22 1406       Initial Vocational Rehab Evaluation & Intervention   Assessment shows need for Vocational Rehabilitation No      Vocational Rehab Re-Evaulation   Comments retired             Education: Education Goals: Education classes will be provided on a variety of topics geared toward better understanding of heart health and risk factor modification. Participant will state understanding/return demonstration of topics presented as noted by education test scores.  Learning Barriers/Preferences:  Learning Barriers/Preferences - 05/11/22 1404       Learning Barriers/Preferences   Learning Barriers Inability  to learn new things   memory retention not good   Learning Preferences None             General Cardiac Education Topics:  AED/CPR: - Group verbal and written instruction with the use of models to demonstrate the basic use of the AED with the basic ABC's of resuscitation.   Anatomy and Cardiac Procedures: - Group verbal and visual presentation and models provide information about basic cardiac anatomy and function. Reviews the testing methods done to diagnose heart disease and the outcomes of the test results. Describes the treatment choices: Medical Management, Angioplasty, or Coronary Bypass Surgery for treating various heart conditions including Myocardial Infarction, Angina, Valve Disease, and Cardiac Arrhythmias.  Written material given at graduation. Flowsheet Row Cardiac Rehab from 06/14/2022 in [facility] Cardiac and Pulmonary Rehab  Education need identified 05/17/22       Medication Safety: - Group verbal and visual instruction to review commonly prescribed medications for heart and lung disease. Reviews the medication, class of the drug, and side effects. Includes the steps to properly store meds and maintain the prescription regimen.  Written material given at graduation. Flowsheet Row Cardiac Rehab from 06/14/2022 in [facility] Cardiac and Pulmonary Rehab  Date 05/24/22  Educator MS  Instruction Review Code 1- Verbalizes Understanding       Intimacy: - Group verbal instruction through game format to discuss how heart and lung disease can affect sexual intimacy. Written material given at graduation..   Know Your Numbers and Heart Failure: - Group verbal and visual instruction to discuss disease risk factors for cardiac and pulmonary disease and treatment options.  Reviews associated critical values for Overweight/Obesity, Hypertension, Cholesterol, and Diabetes.  Discusses basics of heart failure: signs/symptoms and treatments.  Introduces Heart Failure Zone chart for action plan  for heart failure.  Written material given at graduation. Flowsheet Row Cardiac Rehab from 06/14/2022 in [facility] Cardiac and Pulmonary Rehab  Date 05/31/22  Educator [facility]  Instruction Review Code 1- Verbalizes Understanding       Infection Prevention: - Provides verbal and written material to individual with discussion of infection control including proper hand washing and proper equipment cleaning during exercise session. Flowsheet Row Cardiac Rehab from 06/14/2022 in [facility] Cardiac and Pulmonary Rehab  Date 05/17/22  Educator NT  Instruction Review Code 1- Verbalizes Understanding       Falls Prevention: - Provides verbal and written material to individual with discussion of falls prevention and safety. Flowsheet Row Cardiac Rehab from 06/14/2022 in [facility] Cardiac and Pulmonary Rehab  Date 05/11/22  Educator SB  Instruction Review Code 1- Verbalizes Understanding       Other: -Provides group and verbal instruction on various topics (see comments)   Knowledge Questionnaire Score:  Knowledge Questionnaire Score - 05/17/22 1524       Knowledge Questionnaire Score   Pre Score 22/26             Core Components/Risk Factors/Patient Goals at Admission:  Personal Goals and Risk Factors at Admission - 05/17/22 1532       Core Components/Risk Factors/Patient Goals on Admission   Diabetes Yes    Intervention Provide education about signs/symptoms and action to take for hypo/hyperglycemia.;Provide education about proper nutrition, including hydration, and aerobic/resistive exercise prescription along with prescribed medications to achieve blood glucose in normal ranges: Fasting glucose 65-99 mg/dL    Expected Outcomes Short Term: Participant verbalizes understanding of the signs/symptoms and immediate care of hyper/hypoglycemia, proper foot care and importance of medication, aerobic/resistive exercise and nutrition plan for blood glucose control.;Long Term: Attainment of HbA1C < 7%.     Hypertension Yes    Intervention Provide education on lifestyle modifcations including regular physical activity/exercise, weight management, moderate sodium restriction and increased consumption of fresh fruit, vegetables, and low fat dairy, alcohol moderation, and smoking cessation.;Monitor prescription use compliance.    Expected Outcomes Short Term: Continued assessment and intervention until BP is < 140/18mm HG in hypertensive participants. < 130/25mm HG in hypertensive participants with diabetes, heart failure or chronic kidney disease.;Long Term: Maintenance of blood pressure at goal levels.    Lipids Yes    Intervention Provide education and support for participant on nutrition & aerobic/resistive exercise along with prescribed medications to achieve LDL 70mg , HDL >40mg .    Expected Outcomes Short Term: Participant states understanding of desired cholesterol values and is compliant with medications prescribed. Participant is following exercise prescription and nutrition guidelines.;Long Term: Cholesterol controlled with medications as prescribed, with individualized exercise RX and with personalized nutrition plan. Value goals: LDL < 70mg , HDL > 40 mg.             Education:Diabetes - Individual verbal and written instruction to review signs/symptoms of diabetes, desired ranges of glucose level fasting, after meals and with exercise. Acknowledge that pre and post exercise glucose checks will be done for 3 sessions at entry of program.   Core Components/Risk Factors/Patient Goals Review:   Goals and Risk Factor Review     Row Name 05/31/22 1344             Core Components/Risk Factors/Patient Goals Review   Personal Goals Review Diabetes;Lipids;Hypertension       Review Dauson states his wife helps check  his sugar. He is not able to tell me where the numbers have been but states he knows they have been good.They also check his blood pressure which ranges 110-120s/60s.  Pressures at rehab are good, though he states a have been couple high coming in but think its from walking in and not resting all the way. He states he has been taking his medications with no complaints.       Expected Outcomes Short: Continue to monitor sugars and BP closely, become aware of any abnormal values Long: Continue to manage lifestyle risk factors                Core Components/Risk Factors/Patient Goals at Discharge (Final Review):   Goals and Risk Factor Review - 05/31/22 1344       Core Components/Risk Factors/Patient Goals Review   Personal Goals Review  Diabetes;Lipids;Hypertension    Review Adyan states his wife helps check  his sugar. He is not able to tell me where the numbers have been but states he knows they have been good.They also check his blood pressure which ranges 110-120s/60s. Pressures at rehab are good, though he states a have been couple high coming in but think its from walking in and not resting all the way. He states he has been taking his medications with no complaints.    Expected Outcomes Short: Continue to monitor sugars and BP closely, become aware of any abnormal values Long: Continue to manage lifestyle risk factors             ITP Comments:  ITP Comments     Row Name 05/11/22 1412 05/17/22 1523 05/22/22 1336 05/24/22 1524 06/21/22 0941   ITP Comments Virtual orientation call completed today. he has an appointment on Date: [id number]  for EP eval and gym Orientation.  Documentation of diagnosis can be found in [facility] Date: 04/12/2022 . Completed and gym orientation. Initial ITP created and sent for review to Dr. Bethann Punches, Medical Director. First full day of exercise!  Patient was oriented to gym and equipment including functions, settings, policies, and procedures.  Patient's individual exercise prescription and treatment plan were reviewed.  All starting workloads were established based on the results of the 6 minute walk test done at initial  orientation visit.  The plan for exercise progression was also introduced and progression will be customized based on patient's performance and goals. 30 day review completed. ITP sent to Dr. Bethann Punches, Medical Director of Cardiac Rehab. Continue with ITP unless changes are made by physician.   Pt is new to program. 30 Day review completed. Medical Director ITP review done, changes made as directed, and signed approval by Medical Director.    Row Name 06/21/22 1347 07/10/22 1408 07/19/22 1435 08/07/22 1526 08/16/22 1209   ITP Comments Tyquan is currently admitted with 4 broken ribs and some possible internal bleeding.  PT is thinking he will need to go to SNF at discharge.  We have placed him on medical hold for at least the next month. Mrs. Yi called and left a message to for one of [place] to call back for an update on Kathleen. He is doing better and eager to get home.  He is working with physical therapy at [facility] and starting to do some self care duties.  We talked about keeping [place] updated on his progress and to let us know when he is released and plans to return to rehab. 30 day review completed. ITP sent to Dr. Bethann Punches, Medical Director of Cardiac Rehab. Continue with ITP unless changes are made by physician.  Eliazer continues to be out on medical leave. Spoke with patient and wife- patient d/c from SNF and now currently receiving home PT and OT. Once patient is completed with home health, will need clearance to come back to cardiac rehab. Stated he has been using a walker and if he continues to use it, will need to bring that with him to rehab going forward. They will keep [place] updated on what is going on. Continue medical hold. 30 Day review completed. Medical Director ITP review done, changes made as directed, and signed approval by Medical Director.    remains out for medical reasons    Row Name 09/07/22 0803           ITP Comments Dakarai is currently admitted to hospital (  admitted on  5/17) for a seziure and fall and will need a longer recovery.  He has not been able to attend since March.  We will discharge him at this time.                Comments: Discharge ITP

## 2022-09-07 NOTE — Progress Notes (Signed)
Discharge Progress Report  Patient Details  Name: Raymond Sampson MRN: [id number] Date of Birth: [birth year] Referring Provider:   Flowsheet Row Cardiac Rehab from 05/17/2022 in [facility] Cardiac and Pulmonary Rehab  Referring Provider Dr Yvonne Kendall, MD        Number of Visits: 16  Reason for Discharge:  Early Exit:  Hospital Admission  Smoking History:  Social History   Tobacco Use  Smoking Status Former   Packs/day: 1.00   Years: 25.00   Additional pack years: 0.00   Total pack years: 25.00   Types: Cigarettes   Quit date: 04/17/1985   Years since quitting: 37.4  Smokeless Tobacco Never    Diagnosis:  NSTEMI (non-ST elevation myocardial infarction) (HCC)  Status post coronary artery stent placement  Initial Exercise Prescription:  Initial Exercise Prescription - 05/17/22 1500       Date of Initial Exercise RX and Referring Provider   Date 05/17/22    Referring Provider Dr Yvonne Kendall, MD      Oxygen   Maintain Oxygen Saturation 88% or higher      Treadmill   MPH 1.2    Grade 0    Minutes 15    METs 1.92      Recumbant Bike   Level 1    RPM 50    Watts 15    Minutes 15    METs 2.02      NuStep   Level 1    SPM 80    Minutes 15    METs 2.02      Prescription Details   Frequency (times per week) 3    Duration Progress to 30 minutes of continuous aerobic without signs/symptoms of physical distress      Intensity   THRR 40-80% of Max Heartrate 95-124    Ratings of Perceived Exertion 11-13    Perceived Dyspnea 0-4      Progression   Progression Continue to progress workloads to maintain intensity without signs/symptoms of physical distress.      Resistance Training   Training Prescription Yes    Weight 3 lb    Reps 10-15             Discharge Exercise Prescription (Final Exercise Prescription Changes):  Exercise Prescription Changes - 06/29/22 1300       Response to Exercise   Blood Pressure (Admit) 142/68    Blood Pressure  (Exercise) 128/70    Blood Pressure (Exit) 118/60    Heart Rate (Admit) 68 bpm    Heart Rate (Exercise) 87 bpm    Heart Rate (Exit) 68 bpm    Rating of Perceived Exertion (Exercise) 12    Symptoms none    Duration Progress to 30 minutes of  aerobic without signs/symptoms of physical distress    Intensity THRR unchanged      Progression   Progression Continue to progress workloads to maintain intensity without signs/symptoms of physical distress.    Average METs 1.8      Resistance Training   Training Prescription Yes    Weight 3 lb    Reps 10-15      Interval Training   Interval Training No      NuStep   Level 3    Minutes 30    METs 1.8      Biostep-RELP   Level 1    Minutes 15    METs 2      Home Exercise Plan   Plans to continue exercise at  Home (comment)   walking   Frequency Add 2 additional days to program exercise sessions.    Initial Home Exercises Provided 06/14/22      Oxygen   Maintain Oxygen Saturation 88% or higher             Functional Capacity:  6 Minute Walk     Row Name 05/17/22 1533         6 Minute Walk   Phase Initial     Distance 815 feet     Walk Time 5.78 minutes     # of Rest Breaks 2     MPH 1.54     METS 2.02     RPE 12     Perceived Dyspnea  0     VO2 Peak 7.06     Symptoms No     Resting HR 67 bpm     Resting BP 142/60     Resting Oxygen Saturation  100 %     Exercise Oxygen Saturation  during 6 min walk 95 %     Max Ex. HR 96 bpm     Max Ex. BP 172/74     2 Minute Post BP 160/68              Psychological, QOL, Others - Outcomes: PHQ 2/9:    05/17/2022    3:24 PM  Depression screen PHQ 2/9  Decreased Interest 0  Down, Depressed, Hopeless 0  PHQ - 2 Score 0  Altered sleeping 1  Tired, decreased energy 0  Change in appetite 0  Feeling bad or failure about yourself  0  Trouble concentrating 1  Moving slowly or fidgety/restless 0  Suicidal thoughts 0  PHQ-9 Score 2  Difficult doing work/chores Not  difficult at all    Quality of Life:  Quality of Life - 05/17/22 1526       Quality of Life   Select Quality of Life      Quality of Life Scores   Health/Function Pre 17.93 %    Socioeconomic Pre 22.75 %    Psych/Spiritual Pre 24.21 %    Family Pre 24.9 %    GLOBAL Pre 21.2 %            Nutrition & Weight - Outcomes:  Pre Biometrics - 05/17/22 1538       Pre Biometrics   Height 5' 8.5" (1.74 m)    Weight 152 lb (68.9 kg)    Waist Circumference 36 inches    Hip Circumference 36 inches    Waist to Hip Ratio 1 %    BMI (Calculated) 22.77    Single Leg Stand 1.6 seconds

## 2022-09-07 NOTE — Telephone Encounter (Signed)
Patient Advocate Encounter   Received notification that prior authorization for Modafinil 100MG  tablets is required.   PA submitted on 09/07/2022 Key [id number] Insurance [employer] Electronic Prior Authorization Form Status is pending       Roland Earl, CPhT Pharmacy Patient Advocate Specialist [facility] Patient Advocate Team Direct Number: [phone]  Fax: [phone]

## 2022-09-07 NOTE — Progress Notes (Signed)
Nutrition Follow-up  DOCUMENTATION CODES:  Severe malnutrition in context of chronic illness  INTERVENTION:  Continue tube feeding via Cortrak tube: Jevity 1.5 at goal rate of 45 ml/hr (1080 ml per day) Prosource TF20 60 ml 1x/d Provides 1700 kcal, 88 gm protein, 820 ml free water daily MVI with minerals daily  NUTRITION DIAGNOSIS:  Severe Malnutrition related to chronic illness (fall) as evidenced by severe fat depletion, severe muscle depletion. - remains applicable  GOAL:  Patient will meet greater than or equal to 90% of their needs - progressing, being met with TF at goal  MONITOR:  Diet advancement, TF tolerance  REASON FOR ASSESSMENT:  Consult Enteral/tube feeding initiation and management  ASSESSMENT:  Pt with PMH of CAD, CVA 2012, DM, HLD, and HTN admitted with traumatic SDH.   5/21 - seizures 5/22 s/p cortrak placement, per xray tip in gastric antrum  5/29 - SLP evaluation, NPO but pt allowed to have sips of honey thick liquids and puree from floor stock 6/4 - SLP continues to recommend NPO with sips of honey thick liquids  Pt resting in bed at the time of assessment. Does not interact. Wife is at bedside able to provide a hx. States that pt is sleepy today because his TV was left on all night and he did not get good rest. Has not been able to have a diet advanced and SLP hoping to repeat MBS.   Noted that family has expressed that pt would not want a PEG. Will continue current regimen and monitor for ability to advance diet. Currently still NPO and only with sips of honey thick or puree when fully awake.    Nutritionally Relevant Medications: Scheduled Meds:  atorvastatin  80 mg Per Tube Daily   PROSource TF20  60 mL Per Tube BID   insulin aspart  0-15 Units Subcutaneous Q4H   insulin aspart  3 Units Subcutaneous Q4H   insulin glargine-yfgn  15 Units Subcutaneous Daily   multivitamin with minerals  1 tablet Per Tube Daily   Continuous Infusions:  sodium  chloride 75 mL/hr at 09/06/22 2353   JEVITY 1.5 CAL/FIBER 1,000 mL (09/06/22 1501)   PRN Meds: ondansetron  Labs Reviewed: BUN 55 CBG ranges from 121-297 mg/dL over the last 24 hours HgbA1c: 6.0%  NUTRITION - FOCUSED PHYSICAL EXAM: Flowsheet Row Most Recent Value  Orbital Region Severe depletion  Upper Arm Region Severe depletion  Thoracic and Lumbar Region Severe depletion  Buccal Region Severe depletion  Temple Region Severe depletion  Clavicle Bone Region Severe depletion  Clavicle and Acromion Bone Region Severe depletion  Scapular Bone Region Severe depletion  Dorsal Hand Severe depletion  Patellar Region Severe depletion  Anterior Thigh Region Severe depletion  Posterior Calf Region Severe depletion  Edema (RD Assessment) None  Hair Reviewed  Eyes Unable to assess  Mouth Reviewed  Skin Reviewed  [+ecchymosis]  Nails Reviewed   Diet Order:   Diet Order             Diet NPO time specified  Diet effective now                   EDUCATION NEEDS:  Not appropriate for education at this time  Skin:  Skin Assessment: Reviewed RN Assessment (laceration: face)  Last BM:  6/5 - type 6  Height:  Ht Readings from Last 1 Encounters:  08/18/22 5\' 7"  (1.702 m)    Weight:  Wt Readings from Last 1 Encounters:  09/07/22 65.3 kg  BMI:  Body mass index is 22.55 kg/m.  Estimated Nutritional Needs:  Kcal:  1600-1800 kcal/d Protein:  80-100g/d Fluid:  >1.7 L/day    Greig Castilla, RD, LDN Clinical Dietitian RD pager # available in AMION  After hours/weekend pager # available in [facility]

## 2022-09-08 ENCOUNTER — Inpatient Hospital Stay (HOSPITAL_COMMUNITY): Payer: PPO

## 2022-09-08 DIAGNOSIS — E43 Unspecified severe protein-calorie malnutrition: Secondary | ICD-10-CM | POA: Diagnosis not present

## 2022-09-08 DIAGNOSIS — Z515 Encounter for palliative care: Secondary | ICD-10-CM | POA: Diagnosis not present

## 2022-09-08 DIAGNOSIS — S065X0D Traumatic subdural hemorrhage without loss of consciousness, subsequent encounter: Secondary | ICD-10-CM | POA: Diagnosis not present

## 2022-09-08 DIAGNOSIS — R569 Unspecified convulsions: Secondary | ICD-10-CM | POA: Diagnosis not present

## 2022-09-08 DIAGNOSIS — G9340 Encephalopathy, unspecified: Secondary | ICD-10-CM | POA: Diagnosis not present

## 2022-09-08 DIAGNOSIS — S0181XA Laceration without foreign body of other part of head, initial encounter: Secondary | ICD-10-CM | POA: Diagnosis not present

## 2022-09-08 DIAGNOSIS — S065XAA Traumatic subdural hemorrhage with loss of consciousness status unknown, initial encounter: Secondary | ICD-10-CM | POA: Diagnosis not present

## 2022-09-08 LAB — GLUCOSE, CAPILLARY
Glucose-Capillary: 111 mg/dL — ABNORMAL HIGH (ref 70–99)
Glucose-Capillary: 113 mg/dL — ABNORMAL HIGH (ref 70–99)
Glucose-Capillary: 129 mg/dL — ABNORMAL HIGH (ref 70–99)
Glucose-Capillary: 143 mg/dL — ABNORMAL HIGH (ref 70–99)
Glucose-Capillary: 143 mg/dL — ABNORMAL HIGH (ref 70–99)
Glucose-Capillary: 155 mg/dL — ABNORMAL HIGH (ref 70–99)

## 2022-09-08 LAB — BASIC METABOLIC PANEL
Anion gap: 7 (ref 5–15)
BUN: 39 mg/dL — ABNORMAL HIGH (ref 8–23)
CO2: 26 mmol/L (ref 22–32)
Calcium: 8.5 mg/dL — ABNORMAL LOW (ref 8.9–10.3)
Chloride: 101 mmol/L (ref 98–111)
Creatinine, Ser: 0.73 mg/dL (ref 0.61–1.24)
GFR, Estimated: >60 mL/min (ref >60)
Glucose, Bld: 143 mg/dL — ABNORMAL HIGH (ref 70–99)
Potassium: 3.9 mmol/L (ref 3.5–5.1)
Sodium: 134 mmol/L — ABNORMAL LOW (ref 135–145)

## 2022-09-08 LAB — CBC
HCT: 30.6 % — ABNORMAL LOW (ref 39.0–52.0)
Hemoglobin: 9.8 g/dL — ABNORMAL LOW (ref 13.0–17.0)
MCH: 29.7 pg (ref 26.0–34.0)
MCHC: 32 g/dL (ref 30.0–36.0)
MCV: 92.7 fL (ref 80.0–100.0)
Platelets: 421 10*3/uL — ABNORMAL HIGH (ref 150–400)
RBC: 3.3 MIL/uL — ABNORMAL LOW (ref 4.22–5.81)
RDW: 15.2 % (ref 11.5–15.5)
WBC: 9.9 10*3/uL (ref 4.0–10.5)
nRBC: 0 % (ref 0.0–0.2)

## 2022-09-08 MED ORDER — SODIUM CHLORIDE 0.9 % IV SOLN: INTRAVENOUS | Status: AC

## 2022-09-08 MED ORDER — AMANTADINE HCL 100 MG PO CAPS
100.0000 mg | ORAL_CAPSULE | Freq: Every day | ORAL | Status: DC
  Administered 2022-09-09 – 2022-09-21 (×13): 100 mg via ORAL
  Filled 2022-09-08 (×14): qty 1

## 2022-09-08 NOTE — Progress Notes (Addendum)
Subjective: Raymond Sampson. Had MBS this morning.  ROS: negative except above  Examination  Vital signs in last 24 hours: Temp:  [97.7 F (36.5 C)-99 F (37.2 C)] 99 F (37.2 C) (06/07 0845) Pulse Rate:  [71-75] 74 (06/07 0845) Resp:  [16-17] 16 (06/07 0323) BP: (143-174)/(63-75) 163/69 (06/07 0845) SpO2:  [97 %-98 %] 98 % (06/07 0845) Weight:  [66.8 kg] 66.8 kg (06/07 0437)  General: lying in bed, NAD Neuro: MS: Alert, oriented to person and place, time: May, followed commands, able to name objects CN: pupils equal and reactive,  EOMI, face symmetric, tongue midline, normal sensation over face, Motor: 5/5 strength in all 4 extremities    Basic Metabolic Panel: Recent Labs  Lab 09/03/22 0403 09/04/22 0742 09/05/22 0631 09/06/22 1020 09/07/22 0851  NA 131*  --  135 135 134*  K 4.6  --  4.4 4.2 4.0  CL 99  --  101 102 103  CO2 25  --  24 25 23   GLUCOSE 136*  --  164* 150* 176*  BUN 51*  --  47* 55* 47*  CREATININE 0.82 0.86 0.86 0.82 0.67  CALCIUM 8.5*  --  8.7* 8.8* 8.5*  MG  --   --   --  2.1 1.9  PHOS  --   --   --  3.3 2.9    CBC: Recent Labs  Lab 09/03/22 0403 09/05/22 0631 09/06/22 1020 09/07/22 0851  WBC 13.4* 12.4* 12.6* 10.4  NEUTROABS 9.9* 9.2* 9.5*  --   HGB 10.0* 10.2* 9.6* 10.2*  HCT 31.0* 31.4* 30.4* 31.8*  MCV 93.7 94.0 94.4 94.4  PLT 443* 443* 442* 431*     Coagulation Studies: No results for input(s): "LABPROT", "INR" in the last 72 hours.  Imaging No new brain imaging   ASSESSMENT AND PLAN: 82 year old male with right subdural hematoma secondary to fall now with seizures.    Traumatic subdural hematoma Provoked seizures -Seizures most likely secondary to underlying subdural hematoma   Recommendations - Continue Vimpat 50mg  BID. Might be able to stop as seizures happened in first 7 days after SDH. - Wife will decide tomorrow if they wants to start amantadine to promote wakefulness, Provigil was denied by insurance -Continue seizure  precautions -As needed IV Versed for clinical seizures -Discussed plan with family at bedside and Dr.Thompson via secure chat - Neurology will sign off. Please f/u with neuro ( Dr Sherryll Burger at [facility]) in 2-3 moths.  Seizure precautions: Per [facility] statutes, patients with seizures are not allowed to drive until they have been seizure-free for six months and cleared by a physician    Use caution when using heavy equipment or power tools. Avoid working on ladders or at heights. Take showers instead of baths. Ensure the water temperature is not too high on the home water heater. Do not go swimming alone. Do not lock yourself in a room alone (i.e. bathroom). When caring for infants or small children, sit down when holding, feeding, or changing them to minimize risk of injury to the child in the event you have a seizure. Maintain good sleep hygiene. Avoid alcohol.    If patient has another seizure, call 911 and bring them back to the ED if: A.  The seizure lasts longer than 5 minutes.      B.  The patient doesn't wake shortly after the seizure or has new problems such as difficulty seeing, speaking or moving following the seizure C.  The patient was injured  during the seizure D.  The patient has a temperature over 102 F (39C) E.  The patient vomited during the seizure and now is having trouble breathing    During the Seizure   - First, ensure adequate ventilation and place patients on the floor on their left side  Loosen clothing around the neck and ensure the airway is patent. If the patient is clenching the teeth, do not force the mouth open with any object as this can cause severe damage - Remove all items from the surrounding that can be hazardous. The patient may be oblivious to what's happening and may not even know what he or she is doing. If the patient is confused and wandering, either gently guide him/her away and block access to outside areas - Reassure the individual and  be comforting - Call 911. In most cases, the seizure ends before EMS arrives. However, there are cases when seizures may last over 3 to 5 minutes. Or the individual may have developed breathing difficulties or severe injuries. If a pregnant patient or a person with diabetes develops a seizure, it is prudent to call an ambulance. - Finally, if the patient does not regain full consciousness, then call EMS. Most patients will remain confused for about 45 to 90 minutes after a seizure, so you must use judgment in calling for help.   After the Seizure (Postictal Stage)   After a seizure, most patients experience confusion, fatigue, muscle pain and/or a headache. Thus, one should permit the individual to sleep. For the next few days, reassurance is essential. Being calm and helping reorient the person is also of importance.   Most seizures are painless and end spontaneously. Seizures are not harmful to others but can lead to complications such as stress on the lungs, brain and the heart. Individuals with prior lung problems may develop labored breathing and respiratory distress.     I have spent a total of  26 minutes with the patient reviewing hospital notes,  test results, labs and examining the patient as well as establishing an assessment and plan.  > 50% of time was spent in direct patient care.    Lindie Spruce Epilepsy [facility] For questions after 5pm please refer to AMION to reach the Neurologist on call

## 2022-09-08 NOTE — Progress Notes (Signed)
Physical Therapy Treatment Patient Details Name: Raymond Sampson MRN: [id number] DOB: Jul 22, 1940 Today's Date: 09/08/2022   History of Present Illness 82 year old male admitted 08/18/22 after fall at home. CT head 5/18 revealed: large Right Subdural Hematoma, most pronounced along the anterior right frontal convexity.  Developed increased lethargy and change in pupilary size on 5/16 and MRI sowed multiple punctate areas of restricted diffusion in bilateral frontal and parietal cortex suspicious for tiny acute infarcts. LTM EEG positive for nonconvulsive seizures. Pt with history of CAD, prior stroke, hypertension, hyperlipidemia, type 2 diabetes, prior fall in March this year with SNF stay, chronic antiplatelet use with aspirin and Plavix.    PT Comments    Pt is making gradual progress with functional mobility, tolerating performing a stand pivot transfer to/from the bedside commode to have a successful BM this date. He required bil HHA and bil knees to be blocked along with maxA to transfer to stand and total assist to pivot. Pt is inconsistent in following cues and limited by his fear of falling, often pushing or pulling himself in a direction not optimal for his body position. Will continue to follow acutely.     Recommendations for follow up therapy are one component of a multi-disciplinary discharge planning process, led by the attending physician.  Recommendations may be updated based on patient status, additional functional criteria and insurance authorization.  Follow Up Recommendations  Can patient physically be transported by private vehicle: No    Assistance Recommended at Discharge Frequent or constant Supervision/Assistance  Patient can return home with the following Two people to help with walking and/or transfers;Two people to help with bathing/dressing/bathroom;Assist for transportation;Help with stairs or ramp for entrance;Direct supervision/assist for medications  management;Direct supervision/assist for financial management;Assistance with cooking/housework;Assistance with feeding   Equipment Recommendations  Wheelchair (measurements PT);Wheelchair cushion (measurements PT);Hospital bed;Other (comment) (air mattress; hoyer lift; if goes home instead)    Recommendations for Other Services       Precautions / Restrictions Precautions Precautions: Fall Precaution Comments: Seizures; cortrak Restrictions Weight Bearing Restrictions: No     Mobility  Bed Mobility Overal bed mobility: Needs Assistance Bed Mobility: Supine to Sit, Sit to Supine     Supine to sit: Max assist, HOB elevated Sit to supine: Max assist, HOB elevated   General bed mobility comments: Pt needing verbal and tactile cues along with physical guidance to bring each leg off L EOB and bil HHA to cue pt to pull to ascend trunk to sit up L EOB, maxA. MaxA to lean pt on L elbow and swing legs up onto bed for return to supine.    Transfers Overall transfer level: Needs assistance Equipment used: 1 person hand held assist Transfers: Sit to/from Stand, Bed to chair/wheelchair/BSC Sit to Stand: Max assist Stand pivot transfers: Total assist         General transfer comment: Bil knees blocked and pt cued to hold onto therapist to pull to stand from EOB to pivot to L to commode 1x then from commode to pivot to R to EOB 1x. Total assist to initiate stand then pt would activate leg muscles, so maxA for sit <> stand but total assist to pivot hips    Ambulation/Gait               General Gait Details: unable   Stairs             Wheelchair Mobility    Modified Rankin (Stroke Patients Only) Modified Rankin (Stroke Patients Only)  Pre-Morbid Rankin Score: Moderate disability Modified Rankin: Severe disability     Balance Overall balance assessment: Needs assistance Sitting-balance support: Single extremity supported, Feet supported, No upper extremity  supported, Bilateral upper extremity supported Sitting balance-Leahy Scale: Poor Sitting balance - Comments: Pt with L lateral and posterior lean at EOB initially then had anterior lean sitting on commode. Difficult to correct even with max cues as pt would push or pull depending on the surface, reporting fear of falling Postural control:  (all) Standing balance support: Bilateral upper extremity supported Standing balance-Leahy Scale: Zero Standing balance comment: max A needed to stand with knees blocked                            Cognition Arousal/Alertness: Awake/alert Behavior During Therapy: Anxious Overall Cognitive Status: Impaired/Different from baseline Area of Impairment: Attention, Memory, Following commands, Safety/judgement, Problem solving, Awareness                   Current Attention Level: Focused Memory: Decreased short-term memory Following Commands: Follows one step commands inconsistently, Follows one step commands with increased time Safety/Judgement: Decreased awareness of safety, Decreased awareness of deficits Awareness: Intellectual Problem Solving: Slow processing, Decreased initiation, Difficulty sequencing, Requires verbal cues, Requires tactile cues General Comments: Pt with eyes open and awake today but continues to keep a primarily forward gaze. Needs extra time to process all cues, following inconsistently, <20% of time. Poor awareness of his leaning impacting his safety, limited by anxiety with fear of falling        Exercises General Exercises - Lower Extremity Heel Slides: AROM, Both, Other reps (comment), Supine (x2-3)    General Comments        Pertinent Vitals/Pain Pain Assessment Pain Assessment: Faces Faces Pain Scale: Hurts little more Pain Location: generalized Pain Descriptors / Indicators: Discomfort, Grimacing, Guarding, Moaning Pain Intervention(s): Monitored during session, Limited activity within patient's  tolerance, Repositioned    Home Living                          Prior Function            PT Goals (current goals can now be found in the care plan section) Acute Rehab PT Goals Patient Stated Goal: to go to the bathroom PT Goal Formulation: With patient/family Time For Goal Achievement: 09/20/22 Potential to Achieve Goals: Fair Progress towards PT goals: Progressing toward goals    Frequency    Min 3X/week      PT Plan Current plan remains appropriate    Co-evaluation              AM-PAC PT "6 Clicks" Mobility   Outcome Measure  Help needed turning from your back to your side while in a flat bed without using bedrails?: A Lot Help needed moving from lying on your back to sitting on the side of a flat bed without using bedrails?: A Lot Help needed moving to and from a bed to a chair (including a wheelchair)?: A Lot Help needed standing up from a chair using your arms (e.g., wheelchair or bedside chair)?: A Lot Help needed to walk in hospital room?: Total Help needed climbing 3-5 steps with a railing? : Total 6 Click Score: 10    End of Session Equipment Utilized During Treatment: Gait belt Activity Tolerance: Patient tolerated treatment well Patient left: in bed;with call bell/phone within reach;with bed alarm set;with family/visitor  present Nurse Communication: Mobility status;Other (comment) (needs air mattress with wounds noted at buttocks) PT Visit Diagnosis: Other symptoms and signs involving the nervous system (R29.898);Muscle weakness (generalized) (M62.81);Other abnormalities of gait and mobility (R26.89);Unsteadiness on feet (R26.81);Difficulty in walking, not elsewhere classified (R26.2)     Time: 1610-9604 PT Time Calculation (min) (ACUTE ONLY): 43 min  Charges:  $Therapeutic Activity: 38-52 mins                     Raymond Gurney, PT, DPT Acute Rehabilitation Services  Office: [phone]    Jewel Baize 09/08/2022, 5:47  PM

## 2022-09-08 NOTE — Telephone Encounter (Signed)
Patient Advocate Encounter  Received notification that the request for prior authorization for Modafinil 100MG  tablets  has been denied due to being used for an indication which is not approved or medically-accepted.        Raymond Sampson, CPhT Pharmacy Patient Advocate Specialist [facility] Patient Advocate Team Direct Number: [phone]  Fax: [phone]

## 2022-09-08 NOTE — Progress Notes (Signed)
PROGRESS NOTE    Raymond Sampson  ZOX:[id number] DOB: 1940-08-27 DOA: 08/18/2022 PCP: Gracelyn Nurse, MD    Chief Complaint  Patient presents with   Marletta Lor on Thinners    Brief Narrative:  81 year old M with PMH of CAD, CVA, HTN, HLD, DM-2, cervical spine fusion and OA presented to ED after fall at home and found to have traumatic right frontal SDH measuring 2.2 cm in maximum thickness with 5 to 6 mm midline shift to the left, and trace SAH at the right temporal lobe.  Patient was on Plavix and aspirin.  CT cervical spine without acute finding but some sclerotic changes at C2-C3 facet joint.  He was admitted to ICU by neurosurgery. He was initially minimally symptomatic and refused surgery.  However, he developed intermittent focal seizure.    Repeat CT head on 5/18 with stable SDH and SAH.  MRI brain showed multiple small/punctate areas of restricted diffusion with bilateral frontal and parietal cortex suspicious for tiny acute infarcts or small intraparenchymal contusion and/or shear type injury in the setting of trauma.  EEG on 5/21 showed intermittent seizure.  Neurology consulted.  Patient was started on Keppra and Vimpat.  Palliative medicine consulted as well.   Patient was transferred to hospitalist service on 5/23.  Subsequent LTM EEG without seizure.  Neurology signed off.  Encephalopathy improved but seems to be delirious.  Remains NPO except some pured diet from nursing stock.  He is on tube feed via cortrack.  SLP following.  Therapy recommended SNF. Patient remains very sleepy and lethargic, SLP unable to assess him due to sleepiness. Repeat CT head without contrast showed unchanged subdural hematoma.   Stopped keppra altogether today. LTM eeg Discontinued.    Assessment & Plan:   Principal Problem:   Traumatic subdural hematoma without loss of consciousness (HCC) Active Problems:   Provoked seizures (HCC)   Encephalopathy acute   DNR (do not resuscitate)    Protein-calorie malnutrition, severe   Facial laceration   Hyponatremia   SDH (subdural hematoma) (HCC)   Seizure (HCC)   Hypokalemia   Hypophosphatemia   Cerebrovascular accident (CVA) (HCC)   Dysphagia   CAD S/P percutaneous coronary angioplasty   #1 traumatic right subdural hematoma/subarachnoid hemorrhage with midline shift secondary to fall and antiplatelet therapy -Patient presented with a fall found to have a traumatic right subdural hematoma/subarachnoid hemorrhage with midline shift. -Repeat head CT done showed a stable hematoma. -Patient assessed by neurosurgery who recommended no surgical intervention at that time. -Recommendations were to hold aspirin and Plavix for a week and subsequently these have been resumed. -PT/OT recommended SNF. -Patient still with cortrack currently n.p.o. due to excessive lethargy which is improving, SLP following. -Overnight (09/06/2022) due to concerns for change in pupil size, repeat head CT was done with no significant change. -Neurosurgery recommend outpatient follow-up.  2.  Fall at home, left frontal laceration -Status post repair with stitches. -Sutures removed 08/27/2022  3.  Provoked seizures -Felt secondary to right subdural hematoma and subarachnoid hemorrhage with midline shift. -EEG done on 09/01/2022 with seizures. -Patient had subsequent LTM EEG with evidence of epileptogenicity arising from the right central parietal region. -Patient was started on AEDs. -Patient noted to have increased lethargy, neurology reconsulted he was on LTM EEG which did not show any seizure activity. -Neurology initially decreased Keppra dose and finally discontinued it to minimize sedation. -Neurology recommending continuation of current dose of Vimpat at 50 twice daily and if continued lethargy may need amantadine. -Per  neurology patient at risk for more seizures. -Neurology following.  4.  Acute metabolic encephalopathy -Likely secondary to  subdural hematoma, provoked seizures in the setting of memory deficits. -Avoid sedating medications or minimize sedating medications. -Patient with some improvement with continuation of Keppra and decreased dose of Vimpat. -Patient noted to be intermittently awake and eating some food from floor stock component. -TSH done within normal limits, ammonia within normal limits. -Repeat head CT done showed unchanged subdural hematoma. -Urinalysis unremarkable. -Chest x-ray negative for any infiltrate. -Due to increased lethargy neurology reconsulted, patient was on LTM EEG which did not show any seizure activity and as such neurology decreased dose of Keppra initially and subsequently discontinued it to minimize sedation.  Vimpat dose also decreased to 50 mg twice daily. -With decreased AEDs, likely at risk for more seizures. -If continued lethargy neurology recommending possibility of starting patient on amantadine. -Family seems hesitant today to start amantadine and recommended monitoring patient for another 24 hours prior to this being instituted. -Neurology was following but have signed off as of 09/08/2022.  5.  Back pain -Likely secondary to fall. -X-rays are negative for any acute findings. -Lidoderm patch, pain control, PT/OT.  6.  Acute blood loss anemia -Patient with no overt bleeding. -No significant iron deficiency on anemia panel. -Status post transfusion 2 units PRBCs. -Hemoglobin currently stable at 9.8.  7.  CAD status post PCI/DES stent to LCx/OM 2 in January 2024. -Continue Coreg, Lipitor, Plavix, aspirin.   8.  Well-controlled non-insulin-dependent diabetes mellitus -Hemoglobin A1c 6.0 (08/21/2022) -CBG 111 this morning. -Continue Semglee 15 units daily, NovoLog 3 units every 4 hours, SSI.  9.  Hypertension -Norvasc, Coreg.   10.  Hypokalemia/hypophosphatemia -Repleted.    11.  History of CVA -Continue aspirin, Plavix, Lipitor for secondary stroke prophylaxis.     12.  Hyponatremia Resolved.  13.  Dysphagia -Patient on honey thickened pure per nursing. -Assessed by SLP who recommended tube feeds for nutritional supplementation. -Patient noted not to be alert enough to pass it.  With SLP evaluation, improved mental status and alertness after discontinuation of Keppra and decreasing Vimpat. -SLP following. -Family have decided no PEG tube placement at this time. -Palliative care following.  14.  Coughing and congestion -Chest x-ray no acute infiltrate. -Robitussin as needed.  15.  Mood disorder -Zoloft.  16.  Severe protein calorie malnutrition -Currently on tube feeds via cortrack. -Family noted to have decided earlier on the hospitalization for no PEG tube placement. -Continue nutritional supplementation.     DVT prophylaxis: SCDs Code Status: DNR Family Communication: Updated wife at bedside. Disposition: Likely SNF.    Status is: Inpatient Remains inpatient appropriate because: Severity of illness/unsafe disposition   Consultants:  Neurosurgery: Dr. Maurice Small 08/18/2022 Neurology: Dr. Selina Cooley 08/21/2022 Palliative care: Dr. Patterson Hammersmith 08/22/2022  Procedures:  CT head CT C-spine 08/18/2022 CT maxillofacial 08/18/2022 CT head 08/19/2022, 08/20/2022, 09/01/2022 CT angiogram head and neck 08/21/2022 Chest x-ray 09/03/2022  plain films of the left hip and pelvis 08/24/2022 MRI brain 08/20/2022 2D echo 08/22/2022 LTM EEG  Modified barium swallow 08/21/2022 Transfusion 2 units PRBCs 08/25/2022 CT head 09/07/2022  Antimicrobials:  Anti-infectives (From admission, onward)    None         Subjective: Sitting up in bed.  Wife getting ready to feed him.  More alert this afternoon.  Per wife noted to have drank his coffee this morning.  No chest pain.  No shortness of breath.  No abdominal pain.   Objective: Vitals:  09/08/22 0437 09/08/22 0845 09/08/22 1200 09/08/22 1612  BP:  (!) 163/69 129/67 138/83  Pulse:  74 73 81  Resp:       Temp:  99 F (37.2 C) 98 F (36.7 C) 98.3 F (36.8 C)  TempSrc:  Axillary Oral Oral  SpO2:  98% 96% 96%  Weight: 66.8 kg     Height:        Intake/Output Summary (Last 24 hours) at 09/08/2022 1807 Last data filed at 09/08/2022 1600 Gross per 24 hour  Intake 3895.03 ml  Output 1300 ml  Net 2595.03 ml    Filed Weights   09/06/22 0418 09/07/22 0500 09/08/22 0437  Weight: 63.8 kg 65.3 kg 66.8 kg    Examination:  General exam: Alert.  Cortrack in place.  Respiratory system: CTAB anterior lung fields.  No wheezes, no crackles, no rhonchi.  Fair air movement.  Speaking in full sentences.  Cardiovascular system: RRR no murmurs rubs or gallops.  No JVD.  No lower extremity edema.  Gastrointestinal system: Abdomen is soft, nontender, nondistended, positive bowel sounds.  No rebound.  No guarding.  Central nervous system: Alert.  Following commands.  Moving extremities spontaneously.  No focal neurological deficits.   Extremities: Symmetric 5 x 5 power. Skin: No rashes, lesions or ulcers Psychiatry: Judgement and insight unable to assess.  Mood & affect appropriate.     Data Reviewed: I have personally reviewed following labs and imaging studies  CBC: Recent Labs  Lab 09/03/22 0403 09/05/22 0631 09/06/22 1020 09/07/22 0851 09/08/22 1318  WBC 13.4* 12.4* 12.6* 10.4 9.9  NEUTROABS 9.9* 9.2* 9.5*  --   --   HGB 10.0* 10.2* 9.6* 10.2* 9.8*  HCT 31.0* 31.4* 30.4* 31.8* 30.6*  MCV 93.7 94.0 94.4 94.4 92.7  PLT 443* 443* 442* 431* 421*     Basic Metabolic Panel: Recent Labs  Lab 09/03/22 0403 09/04/22 0742 09/05/22 0631 09/06/22 1020 09/07/22 0851 09/08/22 1318  NA 131*  --  135 135 134* 134*  K 4.6  --  4.4 4.2 4.0 3.9  CL 99  --  101 102 103 101  CO2 25  --  24 25 23 26   GLUCOSE 136*  --  164* 150* 176* 143*  BUN 51*  --  47* 55* 47* 39*  CREATININE 0.82 0.86 0.86 0.82 0.67 0.73  CALCIUM 8.5*  --  8.7* 8.8* 8.5* 8.5*  MG  --   --   --  2.1 1.9  --   PHOS  --    --   --  3.3 2.9  --      GFR: Estimated Creatinine Clearance: 66.6 mL/min (by C-G formula based on SCr of 0.73 mg/dL).  Liver Function Tests: Recent Labs  Lab 09/03/22 0403 09/06/22 1020 09/07/22 0851  AST 27  --   --   ALT 23  --   --   ALKPHOS 147*  --   --   BILITOT 0.5  --   --   PROT 6.4*  --   --   ALBUMIN 2.5* 2.3* 2.4*     CBG: Recent Labs  Lab 09/07/22 2324 09/08/22 0327 09/08/22 0751 09/08/22 1125 09/08/22 1600  GLUCAP 161* 113* 111* 143* 155*      No results found for this or any previous visit (from the past 240 hour(s)).       Radiology Studies: DG Swallowing Func-Speech Pathology  Result Date: 09/08/2022 Table formatting from the original result was not included. Modified Barium  Swallow Study Patient Details Name: ROYCE SCHEIDER MRN: [id number] Date of Birth: 1940/10/30 Today's Date: 09/08/2022 HPI/PMH: HPI: 82 year old male who presented for fall. Hit forehead with laceration repair, no LOC, no facial fx, but CT 5/18 revealed: "stable large, approximately 17 mm thick Right Subdural Hematoma, most pronounced along the anterior right frontal convexity. Stable small volume right hemisphere [facility]." Chest CT 5/17: "Emphysema. Right pleural effusion. Hazy upper lobe airspace disease is indeterminate for edema or pneumonia." Neuro change noted by RN around 1300 on 5/19 with workup revealing seizures, now controlled with medication. Pt with history of CAD, prior stroke, hypertension, hyperlipidemia, type 2 diabetes, chronic antiplatelet use with aspirin and Plavix. MBS revealed hardware from ACDF. Cortrak placed 5/22;ST f/u for dysphagia tx/management. Repeat MBS today for potential initiation of po's Clinical Impression: Clinical Impression: Pt continues to exhibit oropharyngeal dysphagia however improvements from prior studies and he will be able to initiate a modified diet. Orally he demonstrated reduced control, cohesion and mastication with lingual residue with  solid texture that he expectorated. Improvements in laryngeal elevation and laryngeal closure present intermittently during the swallow resulted in inconsistent penetration/aspiration however majority of deficits were observed after the swallow. Residue from decreased tongue base retraction and pharyngeal stripping led to vallecular and pyriform sinus residue with residue spilling over interarytenoid space and aspirating during the second swallow with thin and nectar. Pt also appears to have a smaller pyriform sinus space. At times he sensed to produce throat clear however penetrates would return. Larger sips of honey thick was aspirated after and during the swallow that was prevented with smaller controllled sips honey thick. Residue with puree and honey thick was mild, moderate with solid. Esophageal sweep did not reveal significant findings. Recommend pt initiate puree, honey thick liquid via teaspoon, crush meds, swallow twice,  intermittent throat clear and eat when adequately alert with full supervision. Factors that may increase risk of adverse event in presence of aspiration [name] 2021): Factors that may increase risk of adverse event in presence of aspiration [name] 2021): Frail or deconditioned Recommendations/Plan: Swallowing Evaluation Recommendations Swallowing Evaluation Recommendations Recommendations: PO diet PO Diet Recommendation: Dysphagia 1 (Pureed); Moderately thick liquids (Level 3, honey thick) Liquid Administration via: Spoon Medication Administration: Via alternative means (or crush) Supervision: Full assist for feeding Swallowing strategies  : Slow rate; Small bites/sips; Multiple dry swallows after each bite/sip; Hard cough after swallowing; Clear throat intermittently Postural changes: Position pt fully upright for meals Oral care recommendations: Oral care BID (2x/day) Treatment Plan Treatment Plan Treatment recommendations: Therapy as outlined in treatment plan  below Follow-up recommendations: -- (TBD) Functional status assessment: Patient has had a recent decline in their functional status and demonstrates the ability to make significant improvements in function in a reasonable and predictable amount of time. Treatment frequency: Min 2x/week Treatment duration: 2 weeks Interventions: Patient/family education; Trials of upgraded texture/liquids; Diet toleration management by SLP; Compensatory techniques Recommendations Recommendations for follow up therapy are one component of a multi-disciplinary discharge planning process, led by the attending physician.  Recommendations may be updated based on patient status, additional functional criteria and insurance authorization. Assessment: Orofacial Exam: Orofacial Exam Oral Cavity: Oral Hygiene: WFL Oral Cavity - Dentition: Missing dentition Orofacial Anatomy: Other (comment) Oral Motor/Sensory Function: Suspected cranial nerve impairment CN VII - Facial: Left motor impairment Anatomy: Anatomy: Presence of cervical hardware Boluses Administered: Boluses Administered Boluses Administered: Thin liquids (Level 0); Mildly thick liquids (Level 2, nectar thick); Moderately thick liquids (Level 3,  honey thick); Puree  Oral Impairment Domain: Oral Impairment Domain Lip Closure: Escape progressing to mid-chin Tongue control during bolus hold: Escape to lateral buccal cavity/floor of mouth Bolus preparation/mastication: Disorganized chewing/mashing with solid pieces of bolus unchewed Bolus transport/lingual motion: Repetitive/disorganized tongue motion Oral residue: Trace residue lining oral structures Location of oral residue : Tongue (coating) Initiation of pharyngeal swallow : Valleculae  Pharyngeal Impairment Domain: Pharyngeal Impairment Domain Soft palate elevation: No bolus between soft palate (SP)/pharyngeal wall (PW) Laryngeal elevation: Complete superior movement of thyroid cartilage with complete approximation of arytenoids to  epiglottic petiole Anterior hyoid excursion: Complete anterior movement Epiglottic movement: Complete inversion Laryngeal vestibule closure: Incomplete, narrow column air/contrast in laryngeal vestibule Pharyngeal stripping wave : Present - diminished Pharyngeal contraction (A/P view only): N/A Pharyngoesophageal segment opening: Complete distension and complete duration, no obstruction of flow Tongue base retraction: Trace column of contrast or air between tongue base and PPW Pharyngeal residue: Collection of residue within or on pharyngeal structures Location of pharyngeal residue: Valleculae; Pyriform sinuses  Esophageal Impairment Domain: Esophageal Impairment Domain Esophageal clearance upright position: Complete clearance, esophageal coating Pill: Esophageal Impairment Domain Esophageal clearance upright position: Complete clearance, esophageal coating Penetration/Aspiration Scale Score: Penetration/Aspiration Scale Score 1.  Material does not enter airway: Moderately thick liquids (Level 3, honey thick); Puree 3.  Material enters airway, remains ABOVE vocal cords and not ejected out: Mildly thick liquids (Level 2, nectar thick) 4.  Material enters airway, CONTACTS cords then ejected out: Thin liquids (Level 0) 5.  Material enters airway, CONTACTS cords and not ejected out: Mildly thick liquids (Level 2, nectar thick) 6.  Material enters airway, passes BELOW cords then ejected out: Mildly thick liquids (Level 2, nectar thick) 8.  Material enters airway, passes BELOW cords without attempt by patient to eject out (silent aspiration) : Thin liquids (Level 0); Moderately thick liquids (Level 3, honey thick) Compensatory Strategies: Compensatory Strategies Compensatory strategies: Yes Straw: Ineffective Ineffective Straw: Thin liquid (Level 0); Mildly thick liquid (Level 2, nectar thick) Other(comment): Ineffective (throat clear) Ineffective Other(comment): Thin liquid (Level 0); Mildly thick liquid (Level 2,  nectar thick)   General Information: Caregiver present: No  Diet Prior to this Study: NPO; Cortrak/Small bore NG tube; Other (Comment) (honey thick sips)   Temperature : Normal   Respiratory Status: WFL   Supplemental O2: None (Room air)   History of Recent Intubation: No  Behavior/Cognition: Alert; Cooperative; Pleasant mood; Requires cueing Self-Feeding Abilities: Needs assist with self-feeding Baseline vocal quality/speech: Hypophonia/low volume Volitional Cough: Able to elicit Volitional Swallow: Able to elicit Exam Limitations: Fatigue Goal Planning: Prognosis for improved oropharyngeal function: Good Barriers to Reach Goals: Cognitive deficits No data recorded Patient/Family Stated Goal: to eat grits Consulted and agree with results and recommendations: Patient; Family member/caregiver; Nurse Pain: Pain Assessment Pain Assessment: No/denies pain Pain Score: 5 Faces Pain Scale: 4 Breathing: 0 Negative Vocalization: 0 Facial Expression: 0 Body Language: 0 Consolability: 0 PAINAD Score: 0 Facial Expression: 0 Body Movements: 0 Muscle Tension: 0 Compliance with ventilator (intubated pts.): N/A Vocalization (extubated pts.): 0 CPOT Total: 0 Pain Location: generalized Pain Descriptors / Indicators: Guarding; Grimacing; Discomfort Pain Intervention(s): Limited activity within patient's tolerance; Monitored during session End of Session: Start Time:SLP Start Time (ACUTE ONLY): [id number] Stop Time: SLP Stop Time (ACUTE ONLY): 0956 Time Calculation:SLP Time Calculation (min) (ACUTE ONLY): 20 min Charges: SLP Evaluations $ SLP Speech Visit: 1 Visit SLP Evaluations $BSS Swallow: 1 Procedure $MBS Swallow: 1 Procedure $Swallowing Treatment: 1 Procedure SLP visit diagnosis:  SLP Visit Diagnosis: Dysphagia, oropharyngeal phase (R13.12) Past Medical History: Past Medical History: Diagnosis Date  Arthritic-like pain   CAD (coronary artery disease)   a. apical MI 5/07 with LHC showing 50% pCFX, 30% pRCA, 99% pLAD, 99% mLAD, 75% dLAD  (cypher DES x3 to LAD); b. ETT-myoview (12/08) 81% MPHR,  EF 64%, partially reversible inferoapical perfusion defect similar to prior study 12/07; c. 04/2016 MV: EF 59%, fixed anteroapical defect, no ischemia->Low risk.  Carotid arterial disease (HCC)   a. 05/2016 Carotid U/S: 30-40% bilat dzs, f/u in 2 yrs.  Chest pain 04/13/2022  CVA (cerebral vascular accident) [facility])   a. 07/2010 - h/o CVA/TIA.  Diet-controlled type 2 diabetes mellitus (HCC)   HLD (hyperlipidemia)   HTN (hypertension)   PAT (paroxysmal atrial tachycardia)   a. 06/2016 Event monitor:  rare episodes of atrial tachycardia, longest 7 beats. No afib. Past Surgical History: Past Surgical History: Procedure Laterality Date  BACK SURGERY    CARDIAC CATHETERIZATION    CORONARY ANGIOPLASTY WITH STENT PLACEMENT    apical MI 5/07 with LHC showing 50% pCFX, 30% pRCA, 99% pLAD, 9%% mLAD, 75% dLAD. cypher DES x3 to LAD. ETT-myoview (12/08) 81% MPHR, 8'1, EF 64%, partially reversible inferoapical perfusion defect similar to prior study 12/07  CORONARY IMAGING/OCT N/A 04/14/2022  Procedure: INTRAVASCULAR IMAGING/OCT;  Surgeon: Yvonne Kendall, MD;  Location: [facility] INVASIVE CV LAB;  Service: Cardiovascular;  Laterality: N/A;  CORONARY STENT INTERVENTION N/A 04/14/2022  Procedure: CORONARY STENT INTERVENTION;  Surgeon: Yvonne Kendall, MD;  Location: [facility] INVASIVE CV LAB;  Service: Cardiovascular;  Laterality: N/A;  CORONARY STENT INTERVENTION Left 05/02/2022  Procedure: CORONARY STENT INTERVENTION;  Surgeon: Yvonne Kendall, MD;  Location: [facility] INVASIVE CV LAB;  Service: Cardiovascular;  Laterality: Left;  LEFT HEART CATH AND CORONARY ANGIOGRAPHY N/A 04/13/2022  Procedure: LEFT HEART CATH AND CORONARY ANGIOGRAPHY;  Surgeon: Marykay Lex, MD;  Location: [facility] INVASIVE CV LAB;  Service: Cardiovascular;  Laterality: N/A; Royce Macadamia 09/08/2022, 11:00 AM  CT HEAD WO CONTRAST ( )  Result Date: 09/07/2022 CLINICAL DATA:  Subarachnoid hemorrhage EXAM: CT HEAD  WITHOUT CONTRAST TECHNIQUE: Contiguous axial images were obtained from the base of the skull through the vertex without intravenous contrast. RADIATION DOSE REDUCTION: This exam was performed according to the departmental dose-optimization program which includes automated exposure control, adjustment of the mA and/or kV according to patient size and/or use of iterative reconstruction technique. COMPARISON:  09/01/2022 FINDINGS: Brain: Unchanged appearance of intermediate attenuation right convexity subdural hematoma measuring up to 16 mm in thickness. Isodense left subdural collection, likely chronic hematoma, is also unchanged. Persistent mild mass effect on the right hemisphere without midline shift. No new site of hemorrhage. There is periventricular hypoattenuation compatible with chronic microvascular disease. Mild generalized volume loss. Old left occipital infarct with ex vacuo dilatation of the atrium of the left lateral ventricle. Vascular: Mild atherosclerosis at the skull base. Skull: Negative Sinuses/Orbits: Near complete opacification of the left maxillary sinus. Normal orbits. Other: None IMPRESSION: 1. Unchanged appearance of intermediate attenuation right convexity subdural hematoma measuring up to 16 mm in thickness. 2. Unchanged appearance of isodense left subdural collection, likely chronic hematoma. 3. Old left occipital infarct. Electronically Signed   By: Deatra Robinson M.D.   On: 09/07/2022 00:55        Scheduled Meds:  amantadine  100 mg Oral Daily   amLODipine  10 mg Per Tube Daily   aspirin  81 mg Per Tube Daily   atorvastatin  80 mg Per  Tube Daily   carvedilol  12.5 mg Per Tube BID WC   clopidogrel  75 mg Per Tube Daily   enoxaparin (LOVENOX) injection  40 mg Subcutaneous Q24H   feeding supplement (PROSource TF20)  60 mL Per Tube Daily   Gerhardt's butt cream   Topical QID   insulin aspart  0-15 Units Subcutaneous Q4H   insulin aspart  3 Units Subcutaneous Q4H   insulin  glargine-yfgn  15 Units Subcutaneous Daily   lidocaine  1 patch Transdermal Q24H   multivitamin with minerals  1 tablet Per Tube Daily   mouth rinse  15 mL Mouth Rinse 4 times per day   sertraline  25 mg Per Tube QPM   Continuous Infusions:  sodium chloride Stopped (08/28/22 1547)   feeding supplement (JEVITY 1.5 CAL/FIBER) 1,000 mL (09/08/22 1751)   lacosamide (VIMPAT) IV Stopped (09/08/22 1101)     LOS: 21 days    Time spent: 35 minutes    Ramiro Harvest, MD [facility]   To contact the attending provider between 7A-7P or the covering provider during after hours 7P-7A, please log into the web site [email or web address] and access using universal [place] password for that web site. If you do not have the password, please call the hospital operator.  09/08/2022, 6:07 PM

## 2022-09-08 NOTE — Plan of Care (Signed)
  Problem: Health Behavior/Discharge Planning: Goal: Ability to manage health-related needs will improve Outcome: Progressing   Problem: Clinical Measurements: Goal: Ability to maintain clinical measurements within normal limits will improve Outcome: Progressing Goal: Will remain free from infection Outcome: Progressing Goal: Diagnostic test results will improve Outcome: Progressing Goal: Respiratory complications will improve Outcome: Progressing Goal: Cardiovascular complication will be avoided Outcome: Progressing   Problem: Activity: Goal: Risk for activity intolerance will decrease Outcome: Progressing   Problem: Nutrition: Goal: Adequate nutrition will be maintained Outcome: Progressing   Problem: Coping: Goal: Level of anxiety will decrease Outcome: Progressing   Problem: Elimination: Goal: Will not experience complications related to bowel motility Outcome: Progressing Goal: Will not experience complications related to urinary retention Outcome: Progressing   Problem: Pain Managment: Goal: General experience of comfort will improve Outcome: Progressing   Problem: Safety: Goal: Ability to remain free from injury will improve Outcome: Progressing Note: Patient was encouraged to call for assistance. Patient's call light is within reach all shift   Problem: Skin Integrity: Goal: Risk for impaired skin integrity will decrease Outcome: Progressing Note: Patient's skin was kept CD&I at all times to prevent skin breakdown. Patient was re-positioned q2 hrs.    Problem: Education: Goal: Ability to describe self-care measures that may prevent or decrease complications (Diabetes Survival Skills Education) will improve Outcome: Progressing Goal: Individualized Educational Video(s) Outcome: Progressing   Problem: Coping: Goal: Ability to adjust to condition or change in health will improve Outcome: Progressing   Problem: Fluid Volume: Goal: Ability to maintain a  balanced intake and output will improve Outcome: Progressing   Problem: Health Behavior/Discharge Planning: Goal: Ability to identify and utilize available resources and services will improve Outcome: Progressing Goal: Ability to manage health-related needs will improve Outcome: Progressing   Problem: Metabolic: Goal: Ability to maintain appropriate glucose levels will improve Outcome: Progressing   Problem: Nutritional: Goal: Maintenance of adequate nutrition will improve Outcome: Progressing Goal: Progress toward achieving an optimal weight will improve Outcome: Progressing   Problem: Skin Integrity: Goal: Risk for impaired skin integrity will decrease Outcome: Progressing   Problem: Tissue Perfusion: Goal: Adequacy of tissue perfusion will improve Outcome: Progressing

## 2022-09-08 NOTE — Progress Notes (Signed)
Modified Barium Swallow Study  Patient Details  Name: Raymond Sampson MRN: [id number] Date of Birth: 11/09/1940  Today's Date: 09/08/2022  Modified Barium Swallow completed.  Full report located under Chart Review in the Imaging Section.  History of Present Illness 82 year old male who presented for fall. Hit forehead with laceration repair, no LOC, no facial fx, but CT 5/18 revealed: "stable large, approximately 17 mm thick Right Subdural Hematoma, most pronounced along the anterior right frontal convexity. Stable small volume right hemisphere [facility]." Chest CT 5/17: "Emphysema. Right pleural effusion. Hazy upper lobe airspace disease is indeterminate for edema or pneumonia." Neuro change noted by RN around 1300 on 5/19 with workup revealing seizures, now controlled with medication. Pt with history of CAD, prior stroke, hypertension, hyperlipidemia, type 2 diabetes, chronic antiplatelet use with aspirin and Plavix. MBS revealed hardware from ACDF. Cortrak placed 5/22;ST f/u for dysphagia tx/management. Repeat MBS today for potential initiation of po's   Clinical Impression Pt continues to exhibit oropharyngeal dysphagia however improvements from prior studies and he will be able to initiate a modified diet. Orally he demonstrated reduced control, cohesion and mastication with lingual residue with solid texture that he expectorated. Improvements in laryngeal elevation and laryngeal closure present intermittently during the swallow resulted in inconsistent penetration/aspiration however majority of deficits were observed after the swallow. Residue from decreased tongue base retraction and pharyngeal stripping led to vallecular and pyriform sinus residue with residue spilling over interarytenoid space and aspirating during the second swallow with thin and nectar. Pt also appears to have a smaller pyriform sinus space. At times he sensed to produce throat clear however penetrates would return. Larger sips of  honey thick was aspirated after and during the swallow that was prevented with smaller controllled sips honey thick. Residue with puree and honey thick was mild, moderate with solid. Esophageal sweep did not reveal significant findings. Recommend pt initiate puree, honey thick liquid via teaspoon, crush meds, swallow twice,  intermittent throat clear and eat when adequately alert with full supervision. Factors that may increase risk of adverse event in presence of aspiration [name] 2021): Frail or deconditioned  Swallow Evaluation Recommendations Recommendations: PO diet PO Diet Recommendation: Dysphagia 1 (Pureed);Moderately thick liquids (Level 3, honey thick) Liquid Administration via: Spoon Medication Administration: Via alternative means (or crush) Supervision: Full assist for feeding Swallowing strategies  : Slow rate;Small bites/sips;Multiple dry swallows after each bite/sip;Hard cough after swallowing;Clear throat intermittently Postural changes: Position pt fully upright for meals Oral care recommendations: Oral care BID (2x/day)      Royce Macadamia 09/08/2022,11:02 AM

## 2022-09-09 DIAGNOSIS — R569 Unspecified convulsions: Secondary | ICD-10-CM | POA: Diagnosis not present

## 2022-09-09 DIAGNOSIS — G9340 Encephalopathy, unspecified: Secondary | ICD-10-CM | POA: Diagnosis not present

## 2022-09-09 DIAGNOSIS — S0181XA Laceration without foreign body of other part of head, initial encounter: Secondary | ICD-10-CM | POA: Diagnosis not present

## 2022-09-09 DIAGNOSIS — S065X0D Traumatic subdural hemorrhage without loss of consciousness, subsequent encounter: Secondary | ICD-10-CM | POA: Diagnosis not present

## 2022-09-09 LAB — MAGNESIUM: Magnesium: 2 mg/dL (ref 1.7–2.4)

## 2022-09-09 LAB — GLUCOSE, CAPILLARY
Glucose-Capillary: 97 mg/dL (ref 70–99)
Glucose-Capillary: 136 mg/dL — ABNORMAL HIGH (ref 70–99)
Glucose-Capillary: 154 mg/dL — ABNORMAL HIGH (ref 70–99)
Glucose-Capillary: 161 mg/dL — ABNORMAL HIGH (ref 70–99)
Glucose-Capillary: 103 mg/dL — ABNORMAL HIGH (ref 70–99)
Glucose-Capillary: 112 mg/dL — ABNORMAL HIGH (ref 70–99)

## 2022-09-09 LAB — CBC
HCT: 30 % — ABNORMAL LOW (ref 39.0–52.0)
Hemoglobin: 9.5 g/dL — ABNORMAL LOW (ref 13.0–17.0)
MCH: 29.4 pg (ref 26.0–34.0)
MCHC: 31.7 g/dL (ref 30.0–36.0)
MCV: 92.9 fL (ref 80.0–100.0)
Platelets: 393 10*3/uL (ref 150–400)
RBC: 3.23 MIL/uL — ABNORMAL LOW (ref 4.22–5.81)
RDW: 15 % (ref 11.5–15.5)
WBC: 8.7 10*3/uL (ref 4.0–10.5)
nRBC: 0 % (ref 0.0–0.2)

## 2022-09-09 LAB — RENAL FUNCTION PANEL
Albumin: 2.2 g/dL — ABNORMAL LOW (ref 3.5–5.0)
Anion gap: 7 (ref 5–15)
BUN: 35 mg/dL — ABNORMAL HIGH (ref 8–23)
CO2: 26 mmol/L (ref 22–32)
Calcium: 8.4 mg/dL — ABNORMAL LOW (ref 8.9–10.3)
Chloride: 103 mmol/L (ref 98–111)
Creatinine, Ser: 0.7 mg/dL (ref 0.61–1.24)
GFR, Estimated: >60 mL/min (ref >60)
Glucose, Bld: 106 mg/dL — ABNORMAL HIGH (ref 70–99)
Phosphorus: 3.1 mg/dL (ref 2.5–4.6)
Potassium: 4 mmol/L (ref 3.5–5.1)
Sodium: 136 mmol/L (ref 135–145)

## 2022-09-09 NOTE — Progress Notes (Signed)
PROGRESS NOTE    Raymond Sampson  ZOX:[id number] DOB: Feb 07, 1941 DOA: 08/18/2022 PCP: Gracelyn Nurse, MD    Chief Complaint  Patient presents with   Marletta Lor on Thinners    Brief Narrative:  82 year old M with PMH of CAD, CVA, HTN, HLD, DM-2, cervical spine fusion and OA presented to ED after fall at home and found to have traumatic right frontal SDH measuring 2.2 cm in maximum thickness with 5 to 6 mm midline shift to the left, and trace SAH at the right temporal lobe.  Patient was on Plavix and aspirin.  CT cervical spine without acute finding but some sclerotic changes at C2-C3 facet joint.  He was admitted to ICU by neurosurgery. He was initially minimally symptomatic and refused surgery.  However, he developed intermittent focal seizure.    Repeat CT head on 5/18 with stable SDH and SAH.  MRI brain showed multiple small/punctate areas of restricted diffusion with bilateral frontal and parietal cortex suspicious for tiny acute infarcts or small intraparenchymal contusion and/or shear type injury in the setting of trauma.  EEG on 5/21 showed intermittent seizure.  Neurology consulted.  Patient was started on Keppra and Vimpat.  Palliative medicine consulted as well.   Patient was transferred to hospitalist service on 5/23.  Subsequent LTM EEG without seizure.  Neurology signed off.  Encephalopathy improved but seems to be delirious.  Remains NPO except some pured diet from nursing stock.  He is on tube feed via cortrack.  SLP following.  Therapy recommended SNF. Patient remains very sleepy and lethargic, SLP unable to assess him due to sleepiness. Repeat CT head without contrast showed unchanged subdural hematoma.   Stopped keppra altogether today. LTM eeg Discontinued.    Assessment & Plan:   Principal Problem:   Traumatic subdural hematoma without loss of consciousness (HCC) Active Problems:   Provoked seizures (HCC)   Encephalopathy acute   DNR (do not resuscitate)    Protein-calorie malnutrition, severe   Facial laceration   Hyponatremia   SDH (subdural hematoma) (HCC)   Seizure (HCC)   Hypokalemia   Hypophosphatemia   Cerebrovascular accident (CVA) (HCC)   Dysphagia   CAD S/P percutaneous coronary angioplasty   #1 traumatic right subdural hematoma/subarachnoid hemorrhage with midline shift secondary to fall and antiplatelet therapy -Patient presented with a fall found to have a traumatic right subdural hematoma/subarachnoid hemorrhage with midline shift. -Repeat head CT done showed a stable hematoma. -Patient assessed by neurosurgery who recommended no surgical intervention at that time. -Recommendations were to hold aspirin and Plavix for a week and subsequently these have been resumed. -PT/OT recommended SNF. -Patient still with cortrack currently n.p.o. due to excessive lethargy which is improving, SLP following. -Overnight (09/06/2022) due to concerns for change in pupil size, repeat head CT was done with no significant change. -Neurosurgery recommend outpatient follow-up.  2.  Fall at home, left frontal laceration -Status post repair with stitches. -Sutures removed 08/27/2022  3.  Provoked seizures -Felt secondary to right subdural hematoma and subarachnoid hemorrhage with midline shift. -EEG done on 09/01/2022 with seizures. -Patient had subsequent LTM EEG with evidence of epileptogenicity arising from the right central parietal region. -Patient was started on AEDs. -Patient noted to have increased lethargy, neurology reconsulted he was on LTM EEG which did not show any seizure activity. -Neurology initially decreased Keppra dose and finally discontinued it to minimize sedation. -Neurology recommended continuation of current dose of Vimpat at 50 twice daily and if continued lethargy may need amantadine. -Amantadine  to be started today. -Per neurology patient at risk for more seizures. -Neurology following.  4.  Acute metabolic  encephalopathy -Likely secondary to subdural hematoma, provoked seizures in the setting of memory deficits. -Avoid sedating medications or minimize sedating medications. -Patient with some improvement with continuation of Keppra and decreased dose of Vimpat. -Patient noted to be intermittently awake and eating some food from floor stock component early on in the hospitalization. -Patient currently asleep, drowsy somewhat lethargic this morning.. -TSH done within normal limits, ammonia within normal limits. -Repeat head CT done showed unchanged subdural hematoma. -Urinalysis unremarkable. -Chest x-ray negative for any infiltrate. -Due to increased lethargy neurology reconsulted, patient was on LTM EEG which did not show any seizure activity and as such neurology decreased dose of Keppra initially and subsequently discontinued it to minimize sedation.  Vimpat dose also decreased to 50 mg twice daily. -With decreased AEDs, likely at risk for more seizures. -If continued lethargy neurology recommending possibility of starting patient on amantadine. -Family noted had been hesitant about the past few days to start amantadine however with patient's lethargy this morning and drowsiness wife is decided to go ahead and start amantadine. -Amantadine ordered per neurology. -Neurology was following but have signed off as of 09/08/2022.  5.  Back pain -Likely secondary to fall. -X-rays are negative for any acute findings. -Lidoderm patch, pain control, PT/OT.  6.  Acute blood loss anemia -Patient with no overt bleeding. -No significant iron deficiency on anemia panel. -Status post transfusion 2 units PRBCs. -Hemoglobin currently stable at 9.5.  7.  CAD status post PCI/DES stent to LCx/OM 2 in January 2024. -Continue Coreg, Lipitor, Plavix, aspirin.   8.  Well-controlled non-insulin-dependent diabetes mellitus -Hemoglobin A1c 6.0 (08/21/2022) -CBG 97 this morning. -Continue Semglee 15 units daily,  NovoLog 3 units every 4 hours, SSI.  9.  Hypertension -Continue Coreg, Norvasc.    10.  Hypokalemia/hypophosphatemia -Repleted.    11.  History of CVA -Continue aspirin, Plavix, Lipitor for secondary stroke prophylaxis.   12.  Hyponatremia Resolved.  13.  Dysphagia -Patient on honey thickened pure per nursing. -Assessed by SLP who recommended tube feeds for nutritional supplementation. -Patient noted not to be alert enough to pass it.  With SLP evaluation, improved mental status and alertness after discontinuation of Keppra and decreasing Vimpat. -SLP following. -Family have decided no PEG tube placement at this time. -Palliative care following.  14.  Coughing and congestion -Chest x-ray no acute infiltrate. -Robitussin as needed.  15.  Mood disorder -Continue Zoloft.  16.  Severe protein calorie malnutrition -Currently on tube feeds via cortrack. -Family noted to have decided earlier on the hospitalization for no PEG tube placement. -Patient currently drowsy and likely playing a role in patient's nutrition and oral intake. -Continue nutritional supplementation. -SLP at bedside.     DVT prophylaxis: SCDs Code Status: DNR Family Communication: Updated wife at bedside. Disposition: Likely SNF.    Status is: Inpatient Remains inpatient appropriate because: Severity of illness/unsafe disposition   Consultants:  Neurosurgery: Dr. Maurice Small 08/18/2022 Neurology: Dr. Selina Cooley 08/21/2022 Palliative care: Dr. Patterson Hammersmith 08/22/2022  Procedures:  CT head CT C-spine 08/18/2022 CT maxillofacial 08/18/2022 CT head 08/19/2022, 08/20/2022, 09/01/2022 CT angiogram head and neck 08/21/2022 Chest x-ray 09/03/2022  plain films of the left hip and pelvis 08/24/2022 MRI brain 08/20/2022 2D echo 08/22/2022 LTM EEG  Modified barium swallow 08/21/2022 Transfusion 2 units PRBCs 08/25/2022 CT head 09/07/2022  Antimicrobials:  Anti-infectives (From admission, onward)    None  Subjective: Patient is asleep.  Drowsy.  Per wife patient was alert and interactive all day yesterday.  Not sure how his night went however states this morning he is very lethargic and drowsy and is willing for patient to be started on amantadine today.  Speech therapist at bedside.  Objective: Vitals:   09/09/22 0314 09/09/22 0500 09/09/22 0744 09/09/22 1140  BP: (!) 140/63  (!) 155/67 (!) 147/67  Pulse: 66  66 (!) 59  Resp: 16  19 17   Temp: 98.7 F (37.1 C)  98.1 F (36.7 C) 97.7 F (36.5 C)  TempSrc: Axillary  Oral Oral  SpO2: 99%  99% 98%  Weight:  66.7 kg    Height:        Intake/Output Summary (Last 24 hours) at 09/09/2022 1147 Last data filed at 09/09/2022 0738 Gross per 24 hour  Intake 2146.31 ml  Output 900 ml  Net 1246.31 ml    Filed Weights   09/07/22 0500 09/08/22 0437 09/09/22 0500  Weight: 65.3 kg 66.8 kg 66.7 kg    Examination:  General exam: Drowsy, asleep.  Cortrack in place.  Respiratory system: Lungs clear to auscultation bilaterally anterior lung fields.  No wheezes, no crackles, no rhonchi.  Fair air movement.  Speaking in full sentences.   Cardiovascular system: Regular rate rhythm no murmurs rubs or gallops.  No JVD.  No lower extremity edema.  Gastrointestinal system: Abdomen is soft, nontender, nondistended, positive bowel sounds.  No rebound.  No guarding.  Central nervous system: Asleep.  Drowsy.  Moving extremities spontaneously.   Extremities: Symmetric 5 x 5 power. Skin: No rashes, lesions or ulcers Psychiatry: Judgement and insight unable to assess.  Mood & affect appropriate.     Data Reviewed: I have personally reviewed following labs and imaging studies  CBC: Recent Labs  Lab 09/03/22 0403 09/05/22 0631 09/06/22 1020 09/07/22 0851 09/08/22 1318 09/09/22 0323  WBC 13.4* 12.4* 12.6* 10.4 9.9 8.7  NEUTROABS 9.9* 9.2* 9.5*  --   --   --   HGB 10.0* 10.2* 9.6* 10.2* 9.8* 9.5*  HCT 31.0* 31.4* 30.4* 31.8* 30.6* 30.0*  MCV 93.7  94.0 94.4 94.4 92.7 92.9  PLT 443* 443* 442* 431* 421* 393     Basic Metabolic Panel: Recent Labs  Lab 09/05/22 0631 09/06/22 1020 09/07/22 0851 09/08/22 1318 09/09/22 0323  NA 135 135 134* 134* 136  K 4.4 4.2 4.0 3.9 4.0  CL 101 102 103 101 103  CO2 24 25 23 26 26   GLUCOSE 164* 150* 176* 143* 106*  BUN 47* 55* 47* 39* 35*  CREATININE 0.86 0.82 0.67 0.73 0.70  CALCIUM 8.7* 8.8* 8.5* 8.5* 8.4*  MG  --  2.1 1.9  --  2.0  PHOS  --  3.3 2.9  --  3.1     GFR: Estimated Creatinine Clearance: 66.6 mL/min (by C-G formula based on SCr of 0.7 mg/dL).  Liver Function Tests: Recent Labs  Lab 09/03/22 0403 09/06/22 1020 09/07/22 0851 09/09/22 0323  AST 27  --   --   --   ALT 23  --   --   --   ALKPHOS 147*  --   --   --   BILITOT 0.5  --   --   --   PROT 6.4*  --   --   --   ALBUMIN 2.5* 2.3* 2.4* 2.2*     CBG: Recent Labs  Lab 09/08/22 1930 09/08/22 2341 09/09/22 0323 09/09/22 0744 09/09/22 1139  GLUCAP 143* 129* 97 136* 154*      No results found for this or any previous visit (from the past 240 hour(s)).       Radiology Studies: DG Swallowing Func-Speech Pathology  Result Date: 09/08/2022 Table formatting from the original result was not included. Modified Barium Swallow Study Patient Details Name: ZIERRE ANDOLINA MRN: [id number] Date of Birth: May 12, 1940 Today's Date: 09/08/2022 HPI/PMH: HPI: 82 year old male who presented for fall. Hit forehead with laceration repair, no LOC, no facial fx, but CT 5/18 revealed: "stable large, approximately 17 mm thick Right Subdural Hematoma, most pronounced along the anterior right frontal convexity. Stable small volume right hemisphere [facility]." Chest CT 5/17: "Emphysema. Right pleural effusion. Hazy upper lobe airspace disease is indeterminate for edema or pneumonia." Neuro change noted by RN around 1300 on 5/19 with workup revealing seizures, now controlled with medication. Pt with history of CAD, prior stroke, hypertension,  hyperlipidemia, type 2 diabetes, chronic antiplatelet use with aspirin and Plavix. MBS revealed hardware from ACDF. Cortrak placed 5/22;ST f/u for dysphagia tx/management. Repeat MBS today for potential initiation of po's Clinical Impression: Clinical Impression: Pt continues to exhibit oropharyngeal dysphagia however improvements from prior studies and he will be able to initiate a modified diet. Orally he demonstrated reduced control, cohesion and mastication with lingual residue with solid texture that he expectorated. Improvements in laryngeal elevation and laryngeal closure present intermittently during the swallow resulted in inconsistent penetration/aspiration however majority of deficits were observed after the swallow. Residue from decreased tongue base retraction and pharyngeal stripping led to vallecular and pyriform sinus residue with residue spilling over interarytenoid space and aspirating during the second swallow with thin and nectar. Pt also appears to have a smaller pyriform sinus space. At times he sensed to produce throat clear however penetrates would return. Larger sips of honey thick was aspirated after and during the swallow that was prevented with smaller controllled sips honey thick. Residue with puree and honey thick was mild, moderate with solid. Esophageal sweep did not reveal significant findings. Recommend pt initiate puree, honey thick liquid via teaspoon, crush meds, swallow twice,  intermittent throat clear and eat when adequately alert with full supervision. Factors that may increase risk of adverse event in presence of aspiration [name] 2021): Factors that may increase risk of adverse event in presence of aspiration [name] 2021): Frail or deconditioned Recommendations/Plan: Swallowing Evaluation Recommendations Swallowing Evaluation Recommendations Recommendations: PO diet PO Diet Recommendation: Dysphagia 1 (Pureed); Moderately thick liquids (Level 3, honey  thick) Liquid Administration via: Spoon Medication Administration: Via alternative means (or crush) Supervision: Full assist for feeding Swallowing strategies  : Slow rate; Small bites/sips; Multiple dry swallows after each bite/sip; Hard cough after swallowing; Clear throat intermittently Postural changes: Position pt fully upright for meals Oral care recommendations: Oral care BID (2x/day) Treatment Plan Treatment Plan Treatment recommendations: Therapy as outlined in treatment plan below Follow-up recommendations: -- (TBD) Functional status assessment: Patient has had a recent decline in their functional status and demonstrates the ability to make significant improvements in function in a reasonable and predictable amount of time. Treatment frequency: Min 2x/week Treatment duration: 2 weeks Interventions: Patient/family education; Trials of upgraded texture/liquids; Diet toleration management by SLP; Compensatory techniques Recommendations Recommendations for follow up therapy are one component of a multi-disciplinary discharge planning process, led by the attending physician.  Recommendations may be updated based on patient status, additional functional criteria and insurance authorization. Assessment: Orofacial Exam: Orofacial Exam Oral Cavity: Oral Hygiene: [facility]  Oral Cavity - Dentition: Missing dentition Orofacial Anatomy: Other (comment) Oral Motor/Sensory Function: Suspected cranial nerve impairment CN VII - Facial: Left motor impairment Anatomy: Anatomy: Presence of cervical hardware Boluses Administered: Boluses Administered Boluses Administered: Thin liquids (Level 0); Mildly thick liquids (Level 2, nectar thick); Moderately thick liquids (Level 3, honey thick); Puree  Oral Impairment Domain: Oral Impairment Domain Lip Closure: Escape progressing to mid-chin Tongue control during bolus hold: Escape to lateral buccal cavity/floor of mouth Bolus preparation/mastication: Disorganized chewing/mashing with  solid pieces of bolus unchewed Bolus transport/lingual motion: Repetitive/disorganized tongue motion Oral residue: Trace residue lining oral structures Location of oral residue : Tongue (coating) Initiation of pharyngeal swallow : Valleculae  Pharyngeal Impairment Domain: Pharyngeal Impairment Domain Soft palate elevation: No bolus between soft palate (SP)/pharyngeal wall (PW) Laryngeal elevation: Complete superior movement of thyroid cartilage with complete approximation of arytenoids to epiglottic petiole Anterior hyoid excursion: Complete anterior movement Epiglottic movement: Complete inversion Laryngeal vestibule closure: Incomplete, narrow column air/contrast in laryngeal vestibule Pharyngeal stripping wave : Present - diminished Pharyngeal contraction (A/P view only): N/A Pharyngoesophageal segment opening: Complete distension and complete duration, no obstruction of flow Tongue base retraction: Trace column of contrast or air between tongue base and PPW Pharyngeal residue: Collection of residue within or on pharyngeal structures Location of pharyngeal residue: Valleculae; Pyriform sinuses  Esophageal Impairment Domain: Esophageal Impairment Domain Esophageal clearance upright position: Complete clearance, esophageal coating Pill: Esophageal Impairment Domain Esophageal clearance upright position: Complete clearance, esophageal coating Penetration/Aspiration Scale Score: Penetration/Aspiration Scale Score 1.  Material does not enter airway: Moderately thick liquids (Level 3, honey thick); Puree 3.  Material enters airway, remains ABOVE vocal cords and not ejected out: Mildly thick liquids (Level 2, nectar thick) 4.  Material enters airway, CONTACTS cords then ejected out: Thin liquids (Level 0) 5.  Material enters airway, CONTACTS cords and not ejected out: Mildly thick liquids (Level 2, nectar thick) 6.  Material enters airway, passes BELOW cords then ejected out: Mildly thick liquids (Level 2, nectar thick)  8.  Material enters airway, passes BELOW cords without attempt by patient to eject out (silent aspiration) : Thin liquids (Level 0); Moderately thick liquids (Level 3, honey thick) Compensatory Strategies: Compensatory Strategies Compensatory strategies: Yes Straw: Ineffective Ineffective Straw: Thin liquid (Level 0); Mildly thick liquid (Level 2, nectar thick) Other(comment): Ineffective (throat clear) Ineffective Other(comment): Thin liquid (Level 0); Mildly thick liquid (Level 2, nectar thick)   General Information: Caregiver present: No  Diet Prior to this Study: NPO; Cortrak/Small bore NG tube; Other (Comment) (honey thick sips)   Temperature : Normal   Respiratory Status: WFL   Supplemental O2: None (Room air)   History of Recent Intubation: No  Behavior/Cognition: Alert; Cooperative; Pleasant mood; Requires cueing Self-Feeding Abilities: Needs assist with self-feeding Baseline vocal quality/speech: Hypophonia/low volume Volitional Cough: Able to elicit Volitional Swallow: Able to elicit Exam Limitations: Fatigue Goal Planning: Prognosis for improved oropharyngeal function: Good Barriers to Reach Goals: Cognitive deficits No data recorded Patient/Family Stated Goal: to eat grits Consulted and agree with results and recommendations: Patient; Family member/caregiver; Nurse Pain: Pain Assessment Pain Assessment: No/denies pain Pain Score: 5 Faces Pain Scale: 4 Breathing: 0 Negative Vocalization: 0 Facial Expression: 0 Body Language: 0 Consolability: 0 PAINAD Score: 0 Facial Expression: 0 Body Movements: 0 Muscle Tension: 0 Compliance with ventilator (intubated pts.): N/A Vocalization (extubated pts.): 0 CPOT Total: 0 Pain Location: generalized Pain Descriptors / Indicators: Guarding; Grimacing; Discomfort Pain Intervention(s): Limited activity within patient's tolerance; Monitored during session End  of Session: Start Time:SLP Start Time (ACUTE ONLY): [phone] Stop Time: SLP Stop Time (ACUTE ONLY): 0956 Time  Calculation:SLP Time Calculation (min) (ACUTE ONLY): 20 min Charges: SLP Evaluations $ SLP Speech Visit: 1 Visit SLP Evaluations $BSS Swallow: 1 Procedure $MBS Swallow: 1 Procedure $Swallowing Treatment: 1 Procedure SLP visit diagnosis: SLP Visit Diagnosis: Dysphagia, oropharyngeal phase (R13.12) Past Medical History: Past Medical History: Diagnosis Date  Arthritic-like pain   CAD (coronary artery disease)   a. apical MI 5/07 with LHC showing 50% pCFX, 30% pRCA, 99% pLAD, 99% mLAD, 75% dLAD (cypher DES x3 to LAD); b. ETT-myoview (12/08) 81% MPHR,  EF 64%, partially reversible inferoapical perfusion defect similar to prior study 12/07; c. 04/2016 MV: EF 59%, fixed anteroapical defect, no ischemia->Low risk.  Carotid arterial disease (HCC)   a. 05/2016 Carotid U/S: 30-40% bilat dzs, f/u in 2 yrs.  Chest pain 04/13/2022  CVA (cerebral vascular accident) [facility])   a. 07/2010 - h/o CVA/TIA.  Diet-controlled type 2 diabetes mellitus (HCC)   HLD (hyperlipidemia)   HTN (hypertension)   PAT (paroxysmal atrial tachycardia)   a. 06/2016 Event monitor:  rare episodes of atrial tachycardia, longest 7 beats. No afib. Past Surgical History: Past Surgical History: Procedure Laterality Date  BACK SURGERY    CARDIAC CATHETERIZATION    CORONARY ANGIOPLASTY WITH STENT PLACEMENT    apical MI 5/07 with LHC showing 50% pCFX, 30% pRCA, 99% pLAD, 9%% mLAD, 75% dLAD. cypher DES x3 to LAD. ETT-myoview (12/08) 81% MPHR, 8'1, EF 64%, partially reversible inferoapical perfusion defect similar to prior study 12/07  CORONARY IMAGING/OCT N/A 04/14/2022  Procedure: INTRAVASCULAR IMAGING/OCT;  Surgeon: Yvonne Kendall, MD;  Location: [facility] INVASIVE CV LAB;  Service: Cardiovascular;  Laterality: N/A;  CORONARY STENT INTERVENTION N/A 04/14/2022  Procedure: CORONARY STENT INTERVENTION;  Surgeon: Yvonne Kendall, MD;  Location: [facility] INVASIVE CV LAB;  Service: Cardiovascular;  Laterality: N/A;  CORONARY STENT INTERVENTION Left 05/02/2022  Procedure: CORONARY  STENT INTERVENTION;  Surgeon: Yvonne Kendall, MD;  Location: [facility] INVASIVE CV LAB;  Service: Cardiovascular;  Laterality: Left;  LEFT HEART CATH AND CORONARY ANGIOGRAPHY N/A 04/13/2022  Procedure: LEFT HEART CATH AND CORONARY ANGIOGRAPHY;  Surgeon: Marykay Lex, MD;  Location: [facility] INVASIVE CV LAB;  Service: Cardiovascular;  Laterality: N/A; Royce Macadamia 09/08/2022, 11:00 AM       Scheduled Meds:  amantadine  100 mg Oral Daily   amLODipine  10 mg Per Tube Daily   aspirin  81 mg Per Tube Daily   atorvastatin  80 mg Per Tube Daily   carvedilol  12.5 mg Per Tube BID WC   clopidogrel  75 mg Per Tube Daily   enoxaparin (LOVENOX) injection  40 mg Subcutaneous Q24H   feeding supplement (PROSource TF20)  60 mL Per Tube Daily   Gerhardt's butt cream   Topical QID   insulin aspart  0-15 Units Subcutaneous Q4H   insulin aspart  3 Units Subcutaneous Q4H   insulin glargine-yfgn  15 Units Subcutaneous Daily   lidocaine  1 patch Transdermal Q24H   multivitamin with minerals  1 tablet Per Tube Daily   mouth rinse  15 mL Mouth Rinse 4 times per day   sertraline  25 mg Per Tube QPM   Continuous Infusions:  sodium chloride Stopped (08/28/22 1547)   sodium chloride 75 mL/hr at 09/09/22 1146   feeding supplement (JEVITY 1.5 CAL/FIBER) 1,000 mL (09/08/22 1751)   lacosamide (VIMPAT) IV Stopped (09/08/22 2301)     LOS: 22 days  Time spent: 35 minutes    Ramiro Harvest, MD [facility]   To contact the attending provider between 7A-7P or the covering provider during after hours 7P-7A, please log into the web site [email or web address] and access using universal [place] password for that web site. If you do not have the password, please call the hospital operator.  09/09/2022, 11:47 AM

## 2022-09-09 NOTE — Progress Notes (Signed)
Speech Language Pathology Treatment: Dysphagia  Patient Details Name: KYL GIVLER MRN: [id number] DOB: [birth year] Today's Date: 09/09/2022 Time: 6213-0865 SLP Time Calculation (min) (ACUTE ONLY): 13 min  Assessment / Plan / Recommendation Clinical Impression  SLP follow up with pt regarding swallow function and to review MBS from prior date.  Wife present and reports pt consumed some coffee and grits, pureed fruit this am.  She advises she is having him follow the directions listed above including dry swallowing and coughing.    SLP reviewed MBS findings with her including pt's significant retention and silent aspiration of thin/honey liquids.  Implored her to follow the swallow precautions listed and to only feed pt when he is fully alert. Concern for pt obtaining adequate nutrition due to his mentation and dysphagia present at this time.    Wife reports concern for pt having the small bore feeding tube in place and states he was informed it can only stay 21 days - at this time, it has been in for 16 days.  MD arrived and spoke to wife re: pt's poor intake and possible further discussions re:  plan of care.    Will follow up for dysphagia management -  - Using teach back, wife educated to findings/recommendations.      HPI HPI: 82 year old male who presented for fall. Hit forehead with laceration repair, no LOC, no facial fx, but CT 5/18 revealed: "stable large, approximately 17 mm thick Right Subdural Hematoma, most pronounced along the anterior right frontal convexity. Stable small volume right hemisphere [facility]." Chest CT 5/17: "Emphysema. Right pleural effusion. Hazy upper lobe airspace disease is indeterminate for edema or pneumonia." Neuro change noted by RN around 1300 on 5/19 with workup revealing seizures, now controlled with medication. Pt with history of CAD, prior stroke, hypertension, hyperlipidemia, type 2 diabetes, chronic antiplatelet use with aspirin and Plavix. MBS revealed  hardware from ACDF. Cortrak placed 5/22;ST f/u for dysphagia tx/management. Repeat MBS today for potential initiation of po's      SLP Plan  Continue with current plan of care      Recommendations for follow up therapy are one component of a multi-disciplinary discharge planning process, led by the attending physician.  Recommendations may be updated based on patient status, additional functional criteria and insurance authorization.    Recommendations  Diet recommendations: Honey-thick liquid;Dysphagia 1 (puree) Liquids provided via: Teaspoon Medication Administration: Via alternative means Compensations: Slow rate;Small sips/bites;Clear throat intermittently;Multiple dry swallows after each bite/sip (stop po if pt reflexively coughing)                  Oral care QID;Staff/trained caregiver to provide oral care;Oral care prior to ice chip/H20;Oral care before and after PO   Frequent or constant Supervision/Assistance Dysphagia, oropharyngeal phase (R13.12)     Continue with current plan of care   Rolena Infante, MS [facility] SLP Acute Rehab Services Office [phone]   Chales Abrahams  09/09/2022, 12:11 PM

## 2022-09-09 NOTE — Plan of Care (Signed)
Vital signs stable, bowel movement today. Visited with wife and daughter.   Problem: Health Behavior/Discharge Planning: Goal: Ability to manage health-related needs will improve Outcome: Progressing   Problem: Clinical Measurements: Goal: Ability to maintain clinical measurements within normal limits will improve Outcome: Progressing Goal: Will remain free from infection Outcome: Progressing Goal: Diagnostic test results will improve Outcome: Progressing Goal: Respiratory complications will improve Outcome: Progressing

## 2022-09-09 NOTE — Plan of Care (Signed)

## 2022-09-10 ENCOUNTER — Inpatient Hospital Stay (HOSPITAL_COMMUNITY): Payer: PPO

## 2022-09-10 DIAGNOSIS — S065X0D Traumatic subdural hemorrhage without loss of consciousness, subsequent encounter: Secondary | ICD-10-CM | POA: Diagnosis not present

## 2022-09-10 DIAGNOSIS — R569 Unspecified convulsions: Secondary | ICD-10-CM | POA: Diagnosis not present

## 2022-09-10 DIAGNOSIS — S0181XA Laceration without foreign body of other part of head, initial encounter: Secondary | ICD-10-CM | POA: Diagnosis not present

## 2022-09-10 DIAGNOSIS — G9340 Encephalopathy, unspecified: Secondary | ICD-10-CM | POA: Diagnosis not present

## 2022-09-10 LAB — GLUCOSE, CAPILLARY
Glucose-Capillary: 107 mg/dL — ABNORMAL HIGH (ref 70–99)
Glucose-Capillary: 110 mg/dL — ABNORMAL HIGH (ref 70–99)
Glucose-Capillary: 123 mg/dL — ABNORMAL HIGH (ref 70–99)
Glucose-Capillary: 133 mg/dL — ABNORMAL HIGH (ref 70–99)
Glucose-Capillary: 138 mg/dL — ABNORMAL HIGH (ref 70–99)
Glucose-Capillary: 142 mg/dL — ABNORMAL HIGH (ref 70–99)

## 2022-09-10 MED ORDER — LACOSAMIDE 50 MG PO TABS
50.0000 mg | ORAL_TABLET | Freq: Two times a day (BID) | ORAL | Status: DC
  Administered 2022-09-10 – 2022-09-21 (×22): 50 mg
  Filled 2022-09-10 (×22): qty 1

## 2022-09-10 MED ORDER — JEVITY 1.5 CAL/FIBER PO LIQD
1000.0000 mL | ORAL | Status: DC
  Filled 2022-09-10: qty 1000

## 2022-09-10 NOTE — Progress Notes (Signed)
PROGRESS NOTE    Raymond Sampson  ZOX:[id number] DOB: 13-Apr-1940 DOA: 08/18/2022 PCP: Gracelyn Nurse, MD    Chief Complaint  Patient presents with   Marletta Lor on Thinners    Brief Narrative:  82 year old M with PMH of CAD, CVA, HTN, HLD, DM-2, cervical spine fusion and OA presented to ED after fall at home and found to have traumatic right frontal SDH measuring 2.2 cm in maximum thickness with 5 to 6 mm midline shift to the left, and trace SAH at the right temporal lobe.  Patient was on Plavix and aspirin.  CT cervical spine without acute finding but some sclerotic changes at C2-C3 facet joint.  He was admitted to ICU by neurosurgery. He was initially minimally symptomatic and refused surgery.  However, he developed intermittent focal seizure.    Repeat CT head on 5/18 with stable SDH and SAH.  MRI brain showed multiple small/punctate areas of restricted diffusion with bilateral frontal and parietal cortex suspicious for tiny acute infarcts or small intraparenchymal contusion and/or shear type injury in the setting of trauma.  EEG on 5/21 showed intermittent seizure.  Neurology consulted.  Patient was started on Keppra and Vimpat.  Palliative medicine consulted as well.   Patient was transferred to hospitalist service on 5/23.  Subsequent LTM EEG without seizure.  Neurology signed off.  Encephalopathy improved but seems to be delirious.  Remains NPO except some pured diet from nursing stock.  He is on tube feed via cortrack.  SLP following.  Therapy recommended SNF. Patient remains very sleepy and lethargic, SLP unable to assess him due to sleepiness. Repeat CT head without contrast showed unchanged subdural hematoma.   Stopped keppra altogether today. LTM eeg Discontinued.    Assessment & Plan:   Principal Problem:   Traumatic subdural hematoma without loss of consciousness (HCC) Active Problems:   Provoked seizures (HCC)   Encephalopathy acute   DNR (do not resuscitate)    Protein-calorie malnutrition, severe   Facial laceration   Hyponatremia   SDH (subdural hematoma) (HCC)   Seizure (HCC)   Hypokalemia   Hypophosphatemia   Cerebrovascular accident (CVA) (HCC)   Dysphagia   CAD S/P percutaneous coronary angioplasty   #1 traumatic right subdural hematoma/subarachnoid hemorrhage with midline shift secondary to fall and antiplatelet therapy -Patient presented with a fall found to have a traumatic right subdural hematoma/subarachnoid hemorrhage with midline shift. -Repeat head CT done showed a stable hematoma. -Patient assessed by neurosurgery who recommended no surgical intervention at that time. -Recommendations were to hold aspirin and Plavix for a week and subsequently these have been resumed. -PT/OT recommended SNF. -Patient still with cortrack currently n.p.o. due to excessive lethargy which is improving, SLP following. -The evening of (09/06/2022) due to concerns for change in pupil size, repeat head CT was done with no significant change. -Neurosurgery recommend outpatient follow-up.  2.  Fall at home, left frontal laceration -Status post repair with stitches. -Sutures removed 08/27/2022  3.  Provoked seizures -Felt secondary to right subdural hematoma and subarachnoid hemorrhage with midline shift. -EEG done on 09/01/2022 with seizures. -Patient had subsequent LTM EEG with evidence of epileptogenicity arising from the right central parietal region. -Patient was started on AEDs. -Patient noted to have increased lethargy, neurology reconsulted he was on LTM EEG which did not show any seizure activity. -Neurology initially decreased Keppra dose and finally discontinued it to minimize sedation. -Neurology recommended continuation of current dose of Vimpat at 50 twice daily and if continued lethargy may need  amantadine. -Amantadine started 09/09/2022. -Per neurology patient at risk for more seizures. -Neurology following.  4.  Acute metabolic  encephalopathy -Likely secondary to subdural hematoma, provoked seizures in the setting of memory deficits. -Avoid sedating medications or minimize sedating medications. -Patient with some improvement with continuation of Keppra and decreased dose of Vimpat. -Patient noted to be intermittently awake and eating some food from floor stock component early on in the hospitalization. -Patient currently asleep, drowsy somewhat lethargic this morning.. -TSH done within normal limits, ammonia within normal limits. -Repeat head CT done showed unchanged subdural hematoma. -Urinalysis unremarkable. -Chest x-ray negative for any infiltrate. -Due to increased lethargy neurology reconsulted, patient was on LTM EEG which did not show any seizure activity and as such neurology decreased dose of Keppra initially and subsequently discontinued it to minimize sedation.  Vimpat dose also decreased to 50 mg twice daily. -With decreased AEDs, likely at risk for more seizures. -If continued lethargy neurology recommended possibility of starting patient on amantadine. -Family noted had been hesitant about the past few days to start amantadine however due to patient's lethargy the morning of 09/09/2022 and drowsiness wife decided to go ahead and start amantadine which was started on 09/09/2022.. -Amantadine ordered per neurology. -Per wife patient was more alert, conversant, interactive yesterday afternoon after starting amantadine. -Neurology was following but have signed off as of 09/08/2022.  5.  Back pain -Likely secondary to fall. -X-rays are negative for any acute findings. -Lidoderm patch, pain control, PT/OT.  6.  Acute blood loss anemia -Patient with no overt bleeding. -No significant iron deficiency on anemia panel. -Status post transfusion 2 units PRBCs. -Hemoglobin currently stable at 9.5.  7.  CAD status post PCI/DES stent to LCx/OM 2 in January 2024. -Continue Lipitor, Coreg, Plavix, aspirin.   8.   Well-controlled non-insulin-dependent diabetes mellitus -Hemoglobin A1c 6.0 (08/21/2022) -CBG 123 this morning. -Continue Semglee 15 units daily, NovoLog 3 units every 4 hours, SSI.  9.  Hypertension -Norvasc, Coreg.     10.  Hypokalemia/hypophosphatemia -Repleted.    11.  History of CVA -Aspirin, Plavix, Lipitor for secondary stroke prophylaxis.    12.  Hyponatremia Resolved.  13.  Dysphagia -Patient on honey thickened pure per nursing. -Assessed by SLP who recommended tube feeds for nutritional supplementation. -Patient noted not to be alert enough to pass it.  With SLP evaluation, improved mental status and alertness after discontinuation of Keppra and decreasing Vimpat. -SLP following and patient placed on a dysphagia 1 diet with honey thick liquids.. -Family have decided no PEG tube placement at this time. -Palliative care following.  14.  Coughing and congestion -Chest x-ray no acute infiltrate. -Robitussin as needed. -Wife concerned about congestion and possible aspiration and requesting further evaluation. -Repeat chest x-ray.  15.  Mood disorder -Zoloft.   16.  Severe protein calorie malnutrition -Currently on tube feeds via cortrack. -Family noted to have decided earlier on the hospitalization for no PEG tube placement. -Patient currently drowsy and likely playing a role in patient's nutrition and oral intake. -Continue nutritional supplementation. -Currently on tube feeds at a rate of 45 mL/h. -Wife stating patient with some complaints of early satiety with oral intake and was felt patient was full from tube feeds and requesting possibility of decreasing rate of tube feeds or switching to nocturnal feedings. -Will decrease tube feeds to 30 mL/h for now, dietitian to reassess tomorrow. -Patient seen by SLP and patient placed on a dysphagia 1 diet with honey thick liquids.  DVT prophylaxis: SCDs Code Status: DNR Family Communication: Updated wife at  bedside. Disposition: Likely SNF.    Status is: Inpatient Remains inpatient appropriate because: Severity of illness/unsafe disposition   Consultants:  Neurosurgery: Dr. Maurice Small 08/18/2022 Neurology: Dr. Selina Cooley 08/21/2022 Palliative care: Dr. Patterson Hammersmith 08/22/2022  Procedures:  CT head CT C-spine 08/18/2022 CT maxillofacial 08/18/2022 CT head 08/19/2022, 08/20/2022, 09/01/2022 CT angiogram head and neck 08/21/2022 Chest x-ray 09/03/2022  plain films of the left hip and pelvis 08/24/2022 MRI brain 08/20/2022 2D echo 08/22/2022 LTM EEG  Modified barium swallow 08/21/2022 Transfusion 2 units PRBCs 08/25/2022 CT head 09/07/2022  Antimicrobials:  Anti-infectives (From admission, onward)    None         Subjective: Patient is asleep.  Wife at bedside.  Wife states patient was alert this morning and interactive and ate about 25% of his meal.  Wife feels patient is getting a little full on tube feeds affect and his oral intake and requesting as to whether tube feed rate can be decreased or patient to be changed to nocturnal feeds.  Per wife after amantadine patient was alert and very interactive and conversant yesterday afternoon.   Objective: Vitals:   09/10/22 0340 09/10/22 0745 09/10/22 0750 09/10/22 1151  BP: (!) 174/77  (!) 155/65 (!) 124/57  Pulse: 75  71 66  Resp: 19  19 19   Temp: 97.9 F (36.6 C)  98.8 F (37.1 C) 98 F (36.7 C)  TempSrc: Oral  Oral Oral  SpO2: 97%  97% 98%  Weight:  65.4 kg    Height:        Intake/Output Summary (Last 24 hours) at 09/10/2022 1250 Last data filed at 09/10/2022 1051 Gross per 24 hour  Intake 1105.64 ml  Output 1900 ml  Net -794.36 ml    Filed Weights   09/08/22 0437 09/09/22 0500 09/10/22 0745  Weight: 66.8 kg 66.7 kg 65.4 kg    Examination:  General exam: Asleep.  Cortrack in place.  Respiratory system: CTAB anterior lung fields.  No wheezes, no crackles, no rhonchi.  Cardiovascular system: RRR no murmurs rubs or gallops.  No JVD.  No  lower extremity edema.  Gastrointestinal system: Abdomen is soft, nontender, nondistended, positive bowel sounds.  No rebound.  No guarding. Central nervous system: Asleep.  Moving extremities spontaneously.   Extremities: Symmetric 5 x 5 power. Skin: No rashes, lesions or ulcers Psychiatry: Judgement and insight unable to assess.  Mood & affect appropriate.     Data Reviewed: I have personally reviewed following labs and imaging studies  CBC: Recent Labs  Lab 09/05/22 0631 09/06/22 1020 09/07/22 0851 09/08/22 1318 09/09/22 0323  WBC 12.4* 12.6* 10.4 9.9 8.7  NEUTROABS 9.2* 9.5*  --   --   --   HGB 10.2* 9.6* 10.2* 9.8* 9.5*  HCT 31.4* 30.4* 31.8* 30.6* 30.0*  MCV 94.0 94.4 94.4 92.7 92.9  PLT 443* 442* 431* 421* 393     Basic Metabolic Panel: Recent Labs  Lab 09/05/22 0631 09/06/22 1020 09/07/22 0851 09/08/22 1318 09/09/22 0323  NA 135 135 134* 134* 136  K 4.4 4.2 4.0 3.9 4.0  CL 101 102 103 101 103  CO2 24 25 23 26 26   GLUCOSE 164* 150* 176* 143* 106*  BUN 47* 55* 47* 39* 35*  CREATININE 0.86 0.82 0.67 0.73 0.70  CALCIUM 8.7* 8.8* 8.5* 8.5* 8.4*  MG  --  2.1 1.9  --  2.0  PHOS  --  3.3 2.9  --  3.1  GFR: Estimated Creatinine Clearance: 65.9 mL/min (by C-G formula based on SCr of 0.7 mg/dL).  Liver Function Tests: Recent Labs  Lab 09/06/22 1020 09/07/22 0851 09/09/22 0323  ALBUMIN 2.3* 2.4* 2.2*     CBG: Recent Labs  Lab 09/09/22 2010 09/09/22 2325 09/10/22 0341 09/10/22 0752 09/10/22 1153  GLUCAP 103* 112* 142* 123* 110*      No results found for this or any previous visit (from the past 240 hour(s)).       Radiology Studies: No results found.      Scheduled Meds:  amantadine  100 mg Oral Daily   amLODipine  10 mg Per Tube Daily   aspirin  81 mg Per Tube Daily   atorvastatin  80 mg Per Tube Daily   carvedilol  12.5 mg Per Tube BID WC   clopidogrel  75 mg Per Tube Daily   enoxaparin (LOVENOX) injection  40 mg  Subcutaneous Q24H   feeding supplement (PROSource TF20)  60 mL Per Tube Daily   Gerhardt's butt cream   Topical QID   insulin aspart  0-15 Units Subcutaneous Q4H   insulin aspart  3 Units Subcutaneous Q4H   insulin glargine-yfgn  15 Units Subcutaneous Daily   lidocaine  1 patch Transdermal Q24H   multivitamin with minerals  1 tablet Per Tube Daily   mouth rinse  15 mL Mouth Rinse 4 times per day   sertraline  25 mg Per Tube QPM   Continuous Infusions:  sodium chloride Stopped (08/28/22 1547)   sodium chloride 75 mL/hr at 09/10/22 0155   feeding supplement (JEVITY 1.5 CAL/FIBER) 1,000 mL (09/09/22 1759)   lacosamide (VIMPAT) IV 50 mg (09/10/22 1018)     LOS: 23 days    Time spent: 35 minutes    Ramiro Harvest, MD [facility]   To contact the attending provider between 7A-7P or the covering provider during after hours 7P-7A, please log into the web site [email or web address] and access using universal [place] password for that web site. If you do not have the password, please call the hospital operator.  09/10/2022, 12:50 PM

## 2022-09-11 DIAGNOSIS — S065X0D Traumatic subdural hemorrhage without loss of consciousness, subsequent encounter: Secondary | ICD-10-CM | POA: Diagnosis not present

## 2022-09-11 DIAGNOSIS — S0181XA Laceration without foreign body of other part of head, initial encounter: Secondary | ICD-10-CM | POA: Diagnosis not present

## 2022-09-11 DIAGNOSIS — R569 Unspecified convulsions: Secondary | ICD-10-CM | POA: Diagnosis not present

## 2022-09-11 DIAGNOSIS — G9340 Encephalopathy, unspecified: Secondary | ICD-10-CM | POA: Diagnosis not present

## 2022-09-11 LAB — GLUCOSE, CAPILLARY
Glucose-Capillary: 112 mg/dL — ABNORMAL HIGH (ref 70–99)
Glucose-Capillary: 180 mg/dL — ABNORMAL HIGH (ref 70–99)
Glucose-Capillary: 183 mg/dL — ABNORMAL HIGH (ref 70–99)
Glucose-Capillary: 70 mg/dL (ref 70–99)
Glucose-Capillary: 85 mg/dL (ref 70–99)
Glucose-Capillary: 89 mg/dL (ref 70–99)

## 2022-09-11 LAB — BASIC METABOLIC PANEL
Anion gap: 8 (ref 5–15)
BUN: 27 mg/dL — ABNORMAL HIGH (ref 8–23)
CO2: 25 mmol/L (ref 22–32)
Calcium: 8.3 mg/dL — ABNORMAL LOW (ref 8.9–10.3)
Chloride: 98 mmol/L (ref 98–111)
Creatinine, Ser: 0.76 mg/dL (ref 0.61–1.24)
GFR, Estimated: >60 mL/min (ref >60)
Glucose, Bld: 111 mg/dL — ABNORMAL HIGH (ref 70–99)
Potassium: 3.8 mmol/L (ref 3.5–5.1)
Sodium: 131 mmol/L — ABNORMAL LOW (ref 135–145)

## 2022-09-11 MED ORDER — JEVITY 1.5 CAL/FIBER PO LIQD
1000.0000 mL | ORAL | Status: DC
  Administered 2022-09-11 – 2022-09-13 (×3): 1000 mL
  Filled 2022-09-11 (×5): qty 1000

## 2022-09-11 NOTE — TOC Progression Note (Signed)
Transition of Care [facility]) - Progression Note    Patient Details  Name: Raymond Sampson MRN: [id number] Date of Birth: [birth year]  Transition of Care [facility]) CM/SW Contact  Baldemar Lenis, [place] Phone Number: 09/11/2022, 4:12 PM  Clinical Narrative:   CSW met with daughter at bedside to discuss plan for SNF at discharge. Family is hopeful for patient to return to [facility] for rehab when stable. CSW to complete referral and send for review. CSW to follow.    Expected Discharge Plan: Skilled Nursing Facility Barriers to Discharge: English as a second language teacher, Continued Medical Work up  Expected Discharge Plan and Services       Living arrangements for the past 2 months: Single Family Home                                       Social Determinants of Health (SDOH) Interventions SDOH Screenings   Food Insecurity: No Food Insecurity (08/29/2022)  Housing: Low Risk  (08/29/2022)  Transportation Needs: No Transportation Needs (08/29/2022)  Utilities: Not At Risk (08/29/2022)  Depression (PHQ2-9): Low Risk  (05/17/2022)  Tobacco Use: Medium Risk (08/18/2022)    Readmission Risk Interventions     No data to display

## 2022-09-11 NOTE — Progress Notes (Signed)
PROGRESS NOTE    Raymond Sampson  WUJ:[id number] DOB: 10-17-1940 DOA: 08/18/2022 PCP: Gracelyn Nurse, MD    Chief Complaint  Patient presents with   Marletta Lor on Thinners    Brief Narrative:  82 year old M with PMH of CAD, CVA, HTN, HLD, DM-2, cervical spine fusion and OA presented to ED after fall at home and found to have traumatic right frontal SDH measuring 2.2 cm in maximum thickness with 5 to 6 mm midline shift to the left, and trace SAH at the right temporal lobe.  Patient was on Plavix and aspirin.  CT cervical spine without acute finding but some sclerotic changes at C2-C3 facet joint.  He was admitted to ICU by neurosurgery. He was initially minimally symptomatic and refused surgery.  However, he developed intermittent focal seizure.    Repeat CT head on 5/18 with stable SDH and SAH.  MRI brain showed multiple small/punctate areas of restricted diffusion with bilateral frontal and parietal cortex suspicious for tiny acute infarcts or small intraparenchymal contusion and/or shear type injury in the setting of trauma.  EEG on 5/21 showed intermittent seizure.  Neurology consulted.  Patient was started on Keppra and Vimpat.  Palliative medicine consulted as well.   Patient was transferred to hospitalist service on 5/23.  Subsequent LTM EEG without seizure.  Neurology signed off.  Encephalopathy improved but seems to be delirious.  Remains NPO except some pured diet from nursing stock.  He is on tube feed via cortrack.  SLP following.  Therapy recommended SNF. Patient remains very sleepy and lethargic, SLP unable to assess him due to sleepiness. Repeat CT head without contrast showed unchanged subdural hematoma.   Stopped keppra altogether today. LTM eeg Discontinued.    Assessment & Plan:   Principal Problem:   Traumatic subdural hematoma without loss of consciousness (HCC) Active Problems:   Provoked seizures (HCC)   Encephalopathy acute   DNR (do not resuscitate)    Protein-calorie malnutrition, severe   Facial laceration   Hyponatremia   SDH (subdural hematoma) (HCC)   Seizure (HCC)   Hypokalemia   Hypophosphatemia   Cerebrovascular accident (CVA) (HCC)   Dysphagia   CAD S/P percutaneous coronary angioplasty   #1 traumatic right subdural hematoma/subarachnoid hemorrhage with midline shift secondary to fall and antiplatelet therapy -Patient presented with a fall found to have a traumatic right subdural hematoma/subarachnoid hemorrhage with midline shift. -Repeat head CT done showed a stable hematoma. -Patient assessed by neurosurgery who recommended no surgical intervention at that time. -Recommendations were to hold aspirin and Plavix for a week and subsequently these have been resumed. -PT/OT recommended SNF. -Patient still with cortrack has been assessed by SLP and patient started on a diet.  -The evening of (09/06/2022) due to concerns for change in pupil size, repeat head CT was done with no significant change. -Neurosurgery recommended outpatient follow-up.  2.  Fall at home, left frontal laceration -Status post repair with stitches. -Sutures removed 08/27/2022  3.  Provoked seizures -Felt secondary to right subdural hematoma and subarachnoid hemorrhage with midline shift. -EEG done on 09/01/2022 with seizures. -Patient had subsequent LTM EEG with evidence of epileptogenicity arising from the right central parietal region. -Patient was started on AEDs. -Patient noted to have increased lethargy, neurology reconsulted he was on LTM EEG which did not show any seizure activity. -Neurology initially decreased Keppra dose and finally discontinued it to minimize sedation. -Neurology recommended continuation of current dose of Vimpat at 50 twice daily and if continued lethargy may  need amantadine. -Amantadine started 09/09/2022. -Per neurology patient at risk for more seizures. -Neurology following.  4.  Acute metabolic encephalopathy -Likely  secondary to subdural hematoma, provoked seizures in the setting of memory deficits. -Avoid sedating medications or minimize sedating medications. -Patient with some improvement with continuation of Keppra and decreased dose of Vimpat. -Patient noted to be intermittently awake and eating some food from floor stock component early on in the hospitalization. -TSH done within normal limits, ammonia within normal limits. -Repeat head CT done showed unchanged subdural hematoma. -Urinalysis unremarkable. -Chest x-ray negative for any infiltrate. -Due to increased lethargy earlier on during the hospitalization neurology reconsulted, patient was on LTM EEG which did not show any seizure activity and as such neurology decreased dose of Keppra initially and subsequently discontinued it to minimize sedation.  Vimpat dose also decreased to 50 mg twice daily. -With decreased AEDs, likely at risk for more seizures. -Due to ongoing lethargy neurology had recommended starting patient on amantadine. -Family noted had been initially hesitant about starting amantadine however due to patient's lethargy the morning of 09/09/2022 and drowsiness wife decided to go ahead and start amantadine which was started on 09/09/2022.. -Amantadine ordered per neurology. -Per wife patient more alert and more interactive after initiation of amantadine. -Neurology was following but have signed off as of 09/08/2022.  5.  Back pain -Likely secondary to fall. -X-rays are negative for any acute findings. -Lidoderm patch, pain control, PT/OT.  6.  Acute blood loss anemia -Patient with no overt bleeding. -No significant iron deficiency on anemia panel. -Status post transfusion 2 units PRBCs. -Hemoglobin currently stable at 9.5.  7.  CAD status post PCI/DES stent to LCx/OM 2 in January 2024. -Continue Coreg, Lipitor, Plavix, aspirin.   8.  Well-controlled non-insulin-dependent diabetes mellitus -Hemoglobin A1c 6.0 (08/21/2022) -CBG 112  this morning. -Continue Semglee 15 units daily, NovoLog 3 units every 4 hours, SSI.  9.  Hypertension -Continue Coreg, Norvasc.      10.  Hypokalemia/hypophosphatemia -Repleted.    11.  History of CVA -Continue Plavix, aspirin, Lipitor for secondary stroke prophylaxis.   12.  Hyponatremia Resolved.  13.  Dysphagia -Patient on honey thickened pure per nursing. -Assessed by SLP who recommended tube feeds for nutritional supplementation. -Patient noted not to be alert enough to pass it.  With SLP evaluation, improved mental status and alertness after discontinuation of Keppra and decreasing Vimpat. -SLP following and patient placed on a dysphagia 1 diet with honey thick liquids.. -Family have decided no PEG tube placement at this time. -Palliative care following.  14.  Coughing and congestion -Chest x-ray no acute infiltrate. -Robitussin as needed. -Wife concerned about congestion and possible aspiration and requesting further evaluation. -Repeat chest x-ray done 09/10/2022, with no acute infiltrate..  15.  Mood disorder -Continue Zoloft.    16.  Severe protein calorie malnutrition -Currently on tube feeds via cortrack. -Family noted to have decided earlier on the hospitalization for no PEG tube placement. -Patient currently drowsy and likely playing a role in patient's nutrition and oral intake. -Continue nutritional supplementation. -Currently on tube feeds at a rate of 30 mL/h. -Tube feed rate decreased yesterday as wife stated patient with some early satiety and felt was affecting his oral intake. -Discussed with dietitian and patient to be changed to nocturnal feeds. -Patient seen by SLP and patient placed on a dysphagia 1 diet with honey thick liquids.     DVT prophylaxis: SCDs Code Status: DNR Family Communication: Updated wife at bedside. Disposition: Likely  SNF once tolerating adequate oral intake and not requiring tube feeds via cortrack.    Status is:  Inpatient Remains inpatient appropriate because: Severity of illness/unsafe disposition   Consultants:  Neurosurgery: Dr. Maurice Small 08/18/2022 Neurology: Dr. Selina Cooley 08/21/2022 Palliative care: Dr. Patterson Hammersmith 08/22/2022  Procedures:  CT head CT C-spine 08/18/2022 CT maxillofacial 08/18/2022 CT head 08/19/2022, 08/20/2022, 09/01/2022 CT angiogram head and neck 08/21/2022 Chest x-ray 09/03/2022, 09/10/2022 plain films of the left hip and pelvis 08/24/2022 MRI brain 08/20/2022 2D echo 08/22/2022 LTM EEG  Modified barium swallow 08/21/2022 Transfusion 2 units PRBCs 08/25/2022 CT head 09/07/2022  Antimicrobials:  Anti-infectives (From admission, onward)    None         Subjective: Patient awake, alert.  Wife at bedside.  Wife states he has been more late after starting the amantadine.  Tolerating oral intake eating approximately 25% of his meals.  Tube feeds to be changed to nocturnal feeds.    Objective: Vitals:   09/11/22 0406 09/11/22 0728 09/11/22 1233 09/11/22 1700  BP: (!) 163/74 (!) 165/73 113/71 (!) 145/69  Pulse: 74 78 75 76  Resp: 18 19 17 18   Temp: 98.6 F (37 C) 98.3 F (36.8 C) 98.3 F (36.8 C) 98.7 F (37.1 C)  TempSrc: Oral Oral Oral Oral  SpO2: 98% 95% 98% 97%  Weight:      Height:        Intake/Output Summary (Last 24 hours) at 09/11/2022 1809 Last data filed at 09/11/2022 1234 Gross per 24 hour  Intake 420 ml  Output 2000 ml  Net -1580 ml    Filed Weights   09/08/22 0437 09/09/22 0500 09/10/22 0745  Weight: 66.8 kg 66.7 kg 65.4 kg    Examination:  General exam: Alert.  Cortrack in place. Respiratory system: Lungs clear to auscultation anterior lung fields.  No wheezes, no crackles, no rhonchi.  Fair air movement.  Cardiovascular system: Regular rate and rhythm no murmurs rubs or gallops.  No JVD.  No lower extremity edema.   Gastrointestinal system: Abdomen soft, nontender, nondistended, positive bowel sounds.  No rebound.  No guarding.   Central nervous system:  Awake.  Alert.  Moving extremities spontaneously.   Extremities: Symmetric 5 x 5 power. Skin: No rashes, lesions or ulcers Psychiatry: Judgement and insight poor to fair..  Mood & affect appropriate.     Data Reviewed: I have personally reviewed following labs and imaging studies  CBC: Recent Labs  Lab 09/05/22 0631 09/06/22 1020 09/07/22 0851 09/08/22 1318 09/09/22 0323  WBC 12.4* 12.6* 10.4 9.9 8.7  NEUTROABS 9.2* 9.5*  --   --   --   HGB 10.2* 9.6* 10.2* 9.8* 9.5*  HCT 31.4* 30.4* 31.8* 30.6* 30.0*  MCV 94.0 94.4 94.4 92.7 92.9  PLT 443* 442* 431* 421* 393     Basic Metabolic Panel: Recent Labs  Lab 09/06/22 1020 09/07/22 0851 09/08/22 1318 09/09/22 0323 09/11/22 0710  NA 135 134* 134* 136 131*  K 4.2 4.0 3.9 4.0 3.8  CL 102 103 101 103 98  CO2 25 23 26 26 25   GLUCOSE 150* 176* 143* 106* 111*  BUN 55* 47* 39* 35* 27*  CREATININE 0.82 0.67 0.73 0.70 0.76  CALCIUM 8.8* 8.5* 8.5* 8.4* 8.3*  MG 2.1 1.9  --  2.0  --   PHOS 3.3 2.9  --  3.1  --      GFR: Estimated Creatinine Clearance: 65.9 mL/min (by C-G formula based on SCr of 0.76 mg/dL).  Liver Function Tests:  Recent Labs  Lab 09/06/22 1020 09/07/22 0851 09/09/22 0323  ALBUMIN 2.3* 2.4* 2.2*     CBG: Recent Labs  Lab 09/10/22 2346 09/11/22 0333 09/11/22 0731 09/11/22 1236 09/11/22 1720  GLUCAP 107* 85 112* 70 89      No results found for this or any previous visit (from the past 240 hour(s)).       Radiology Studies: DG CHEST PORT 1 VIEW  Result Date: 09/10/2022 CLINICAL DATA:  Cough EXAM: PORTABLE CHEST 1 VIEW COMPARISON:  09/03/2022 x-ray FINDINGS: Enteric tube with tip extending beneath the diaphragm. Fixation hardware along the lower cervical spine at the edge of the imaging field. Minimal right basilar atelectasis. Smaller area in the left lung base as well. No separate consolidation, pneumothorax, effusion or edema. Normal cardiopericardial silhouette. IMPRESSION: Enteric tube in  place. Mild basilar atelectasis. Electronically Signed   By: Karen Kays M.D.   On: 09/10/2022 19:56        Scheduled Meds:  amantadine  100 mg Oral Daily   amLODipine  10 mg Per Tube Daily   aspirin  81 mg Per Tube Daily   atorvastatin  80 mg Per Tube Daily   carvedilol  12.5 mg Per Tube BID WC   clopidogrel  75 mg Per Tube Daily   enoxaparin (LOVENOX) injection  40 mg Subcutaneous Q24H   feeding supplement (JEVITY 1.5 CAL/FIBER)  1,000 mL Per Tube Q24H   Gerhardt's butt cream   Topical QID   insulin aspart  0-15 Units Subcutaneous Q4H   insulin aspart  3 Units Subcutaneous Q4H   insulin glargine-yfgn  15 Units Subcutaneous Daily   lacosamide  50 mg Per Tube BID   lidocaine  1 patch Transdermal Q24H   multivitamin with minerals  1 tablet Per Tube Daily   mouth rinse  15 mL Mouth Rinse 4 times per day   sertraline  25 mg Per Tube QPM   Continuous Infusions:  sodium chloride 10 mL/hr at 09/11/22 0226     LOS: 24 days    Time spent: 35 minutes    Ramiro Harvest, MD [facility]   To contact the attending provider between 7A-7P or the covering provider during after hours 7P-7A, please log into the web site [email or web address] and access using universal [place] password for that web site. If you do not have the password, please call the hospital operator.  09/11/2022, 6:09 PM

## 2022-09-11 NOTE — Plan of Care (Signed)
  Problem: Health Behavior/Discharge Planning: Goal: Ability to manage health-related needs will improve Outcome: Not Progressing   Problem: Clinical Measurements: Goal: Ability to maintain clinical measurements within normal limits will improve Outcome: Progressing Goal: Will remain free from infection Outcome: Progressing Goal: Respiratory complications will improve Outcome: Progressing   

## 2022-09-11 NOTE — Progress Notes (Addendum)
Nutrition Follow-up  DOCUMENTATION CODES:  Severe malnutrition in context of chronic illness  INTERVENTION:  Continue diet per SLP recommendation Feeding assistance Continue tube feeding via Cortrak tube. Adjust to nocturnal TF per family request: Jevity 1.5 at 75 ml/hr x 12 hours(900 ml per day) Provides 1350 kcal, 57 gm protein, 684 ml free water daily MVI with minerals daily Calorie Count x 48 hours  NUTRITION DIAGNOSIS:  Severe Malnutrition related to chronic illness (fall) as evidenced by severe fat depletion, severe muscle depletion. - remains applicable  GOAL:  Patient will meet greater than or equal to 90% of their needs - progressing, being met with TF at goal  MONITOR:  Diet advancement, TF tolerance  REASON FOR ASSESSMENT:  Consult Enteral/tube feeding initiation and management  ASSESSMENT:  Pt with PMH of CAD, CVA 2012, DM, HLD, and HTN admitted with traumatic SDH.   5/21 - seizures 5/22 s/p cortrak placement, per xray tip in gastric antrum  5/29 - SLP evaluation, NPO but pt allowed to have sips of honey thick liquids and puree from floor stock 6/4 - SLP continues to recommend NPO with sips of honey thick liquids 6/7 - MBS, DYS1 with honey thick; pt with aspiration of larger sips of honey thick liquids and with thin and nectar.  Pt able to have diet put in place 6/7 after MBS. PO intake poor. Pt reporting that he is feeling full and MD requests nocturnal feeds. Will adjust regimen to infuse x 12 hours and meet ~80% of his needs.   Did express my concern to MD and PMT that pt is likely not going to be able to support himself with PO alone as he is only allowed liquids via teaspoon and was diagnosed with severe malnutrition on admission. Wife reported to  ICU RD that pt's appetite had ben poor since a fall in March and he had exhibited a 20 lb weight loss. Pt has expressed previously he would not want a PEG  Will monitor the results of kcal count and of GOC  conversations.  Nutritionally Relevant Medications: Scheduled Meds:  amantadine  100 mg Oral Daily   atorvastatin  80 mg Per Tube Daily   feeding supplement (JEVITY 1.5 CAL/FIBER)  1,000 mL Per Tube Q24H   insulin aspart  0-15 Units Subcutaneous Q4H   insulin aspart  3 Units Subcutaneous Q4H   insulin glargine-yfgn  15 Units Subcutaneous Daily   multivitamin with minerals  1 tablet Per Tube Daily   PRN Meds: ondansetron   Labs Reviewed: Na 131 BUN 27 CBG ranges from 85-138 mg/dL over the last 24 hours HgbA1c: 6.0%  NUTRITION - FOCUSED PHYSICAL EXAM: Flowsheet Row Most Recent Value  Orbital Region Severe depletion  Upper Arm Region Severe depletion  Thoracic and Lumbar Region Severe depletion  Buccal Region Severe depletion  Temple Region Severe depletion  Clavicle Bone Region Severe depletion  Clavicle and Acromion Bone Region Severe depletion  Scapular Bone Region Severe depletion  Dorsal Hand Severe depletion  Patellar Region Severe depletion  Anterior Thigh Region Severe depletion  Posterior Calf Region Severe depletion  Edema (RD Assessment) None  Hair Reviewed  Eyes Unable to assess  Mouth Reviewed  Skin Reviewed  [+ecchymosis]  Nails Reviewed   Diet Order:   Diet Order             DIET - DYS 1 Room service appropriate? No; Fluid consistency: Honey Thick  Diet effective now  EDUCATION NEEDS:  Not appropriate for education at this time  Skin:  Skin Assessment: Reviewed RN Assessment (laceration: face) Skin tear - right coccyx (1 cm x 1 cm)  Last BM:  6/10 - type 6  Height:  Ht Readings from Last 1 Encounters:  08/18/22 5\' 7"  (1.702 m)    Weight:  Wt Readings from Last 1 Encounters:  09/10/22 65.4 kg    BMI:  Body mass index is 22.58 kg/m.  Estimated Nutritional Needs:  Kcal:  1600-1800 kcal/d Protein:  80-100g/d Fluid:  >1.7 L/day    Greig Castilla, RD, LDN Clinical Dietitian RD pager # available in AM[facility]  After  hours/weekend pager # available in [facility]

## 2022-09-11 NOTE — Progress Notes (Signed)
Daily Progress Note   Patient Name: Raymond Sampson       Date: 09/11/2022 DOB: June 20, 1940  Age: 82 y.o. MRN#: [id number] Attending Physician: Rodolph Bong, MD Primary Care Physician: Gracelyn Nurse, MD Admit Date: 08/18/2022  Reason for Consultation/Follow-up: Establishing goals of care  Subjective: Patient sitting up in bed in NAD. Wife at bedside.  Length of Stay: 24  Current Medications: Scheduled Meds:   amantadine  100 mg Oral Daily   amLODipine  10 mg Per Tube Daily   aspirin  81 mg Per Tube Daily   atorvastatin  80 mg Per Tube Daily   carvedilol  12.5 mg Per Tube BID WC   clopidogrel  75 mg Per Tube Daily   enoxaparin (LOVENOX) injection  40 mg Subcutaneous Q24H   feeding supplement (JEVITY 1.5 CAL/FIBER)  1,000 mL Per Tube Q24H   Gerhardt's butt cream   Topical QID   insulin aspart  0-15 Units Subcutaneous Q4H   insulin aspart  3 Units Subcutaneous Q4H   insulin glargine-yfgn  15 Units Subcutaneous Daily   lacosamide  50 mg Per Tube BID   lidocaine  1 patch Transdermal Q24H   multivitamin with minerals  1 tablet Per Tube Daily   mouth rinse  15 mL Mouth Rinse 4 times per day   sertraline  25 mg Per Tube QPM    Continuous Infusions:  sodium chloride 10 mL/hr at 09/11/22 0226    PRN Meds: sodium chloride, acetaminophen (TYLENOL) oral liquid 160 mg/5 mL, [DISCONTINUED] acetaminophen **OR** acetaminophen, clonazepam, guaiFENesin-dextromethorphan, labetalol, [DISCONTINUED] ondansetron **OR** ondansetron (ZOFRAN) IV, mouth rinse  Physical Exam Vitals reviewed.  HENT:     Nose:     Comments: cortrak Eyes:     Comments: Left eyelid red  Cardiovascular:     Rate and Rhythm: Normal rate.  Pulmonary:     Effort: Pulmonary effort is normal.  Skin:     General: Skin is warm and dry.  Neurological:     Mental Status: He is alert and oriented to person, place, and time.  Psychiatric:        Mood and Affect: Mood normal.        Behavior: Behavior normal.             Vital Signs: BP (!) 165/73 (BP Location:  Right Arm)   Pulse 78   Temp 98.3 F (36.8 C) (Oral)   Resp 19   Ht 5\' 7"  (1.702 m)   Wt 65.4 kg Comment: Without blankets  SpO2 95%   BMI 22.58 kg/m  SpO2: SpO2: 95 % O2 Device: O2 Device: Room Air O2 Flow Rate: O2 Flow Rate (L/min): 2 L/min  Intake/output summary:  Intake/Output Summary (Last 24 hours) at 09/11/2022 1009 Last data filed at 09/11/2022 0445 Gross per 24 hour  Intake 2656.33 ml  Output 2650 ml  Net 6.33 ml   LBM: Last BM Date : 09/11/22 Baseline Weight: Weight: 65.2 kg Most recent weight: Weight: 65.4 kg (Without blankets)       Palliative Assessment/Data:      Patient Active Problem List   Diagnosis Date Noted   Facial laceration 09/06/2022   Hyponatremia 09/06/2022   SDH (subdural hematoma) (HCC) 09/06/2022   Seizure (HCC) 09/06/2022   Hypokalemia 09/06/2022   Hypophosphatemia 09/06/2022   Cerebrovascular accident (CVA) (HCC) 09/06/2022   Dysphagia 09/06/2022   CAD S/P percutaneous coronary angioplasty 09/06/2022   Protein-calorie malnutrition, severe 08/24/2022   Provoked seizures (HCC) 08/23/2022   Encephalopathy acute 08/23/2022   DNR (do not resuscitate) 08/23/2022   Traumatic subdural hematoma without loss of consciousness (HCC) 08/18/2022   Fall 06/22/2022   Malnutrition of moderate degree 06/22/2022   Acute blood loss anemia 06/21/2022   Multiple rib fractures involving four or more ribs, right side 06/20/2022   Intramuscular hematoma 06/19/2022   Rib fractures 06/19/2022   Hemothorax on right 06/19/2022   Leukocytosis 06/19/2022   Status post coronary artery stent placement 05/02/2022   Mild dementia (HCC) 04/14/2022   Non-ST elevation (NSTEMI) myocardial infarction (HCC)  04/13/2022   History of COVID-19 04/13/2022   Lung nodule 04/13/2022   PAD (peripheral artery disease) (HCC) 04/13/2022   Flu 06/05/2017   Memory loss, short term 02/07/2016   History of CVA (cerebrovascular accident) 02/07/2016   Diabetes mellitus type 2, uncomplicated (HCC) 04/06/2015   Carotid stenosis 05/14/2013   CAROTID BRUIT, LEFT 04/25/2010   PALPITATIONS 06/18/2009   Hyperlipidemia 04/27/2009   Essential hypertension 04/27/2009   Ischemic heart disease 04/27/2009    Palliative Care Assessment & Plan   Patient Profile: 82 y.o. male with past medical history of CAD, stroke, DM2, HTN, HLD admitted on 08/18/2022 after a fall at his home with a large right-sided subdural hematoma. He was initially alert and oriented but on 08/21/22 he he became more drowsy and was only able to intermittently follow commands and intermittently answer questions. EEG to evaluate for seizure.   He is now day 23 of his hospitalization. Patient and family face ongoing treatment option decisions, advance directive decisions and anticipatory care needs.   Assessment: Patient is alert and more talkative this morning. His wife states he ate one sausage and some eggs. When asked how breakfast was he said, "not worth it" and states this was too much food for him. The patient's wife says the plan is to change his feeds to overnight. She believes his new medication, amantadine, is helping with his lethargy. She is hopeful that his increased alertness and change in feeds will allow him to eat more in the daytime. She continues to support delirium protocols by ensuring his blinds are open during the day and closed at night.    Recommendations/Plan: DNR Patient has stated in the past he does not want a PEG tube. RD plans to place calorie count order to assess  nutritional intake Allow time for outcomes Encourage family to continue conversations around [facility]  Goals of Care and Additional Recommendations: Limitations  on Scope of Treatment: Use MOST form for guidance.  Code Status:    Code Status Orders  (From admission, onward)           Start     Ordered   08/19/22 0022  Do not attempt resuscitation (DNR)  Continuous       Question Answer Comment  If patient has no pulse and is not breathing Do Not Attempt Resuscitation   If patient has a pulse and/or is breathing: Medical Treatment Goals COMFORT MEASURES: Keep clean/warm/dry, use medication by any route; positioning, wound care and other measures to relieve pain/suffering; use oxygen, suction/manual treatment of airway obstruction for comfort; do not transfer unless for comfort needs.   Consent: Discussion documented in EHR or advanced directives reviewed      08/19/22 0023           Code Status History     Date Active Date Inactive Code Status Order ID Comments User Context   08/18/2022 2105 08/19/2022 0023 Full Code [id number]  Vonna Drafts ED   06/28/2022 1422 07/03/2022 2122 DNR [id number]  Morton Stall, NP Inpatient   06/19/2022 1721 06/28/2022 1422 Full Code [id number]  Floydene Flock, MD ED   05/02/2022 1135 05/03/2022 1523 Full Code [id number]  Yvonne Kendall, MD Inpatient   04/13/2022 0048 04/15/2022 2115 Full Code [id number]  Nolberto Hanlon, MD ED   06/05/2017 2015 06/07/2017 1757 Full Code [id number]  Ihor Austin, MD Inpatient       Prognosis:  Unable to determine  Discharge Planning: To Be Determined  Care plan was discussed with Dr. Janee Morn, bedside RN, and Fleet Contras RD  Thank you for allowing the Palliative Medicine Team to assist in the care of this patient.  Time spent: 40 minutes   Detailed review of medical records ( labs, imaging, vital signs), medically appropriate exam, counseling and education to care partner, documenting clinical information, medication management, coordination of care.   Sherryll Burger, NP  Please contact Palliative Medicine Team phone at [phone] for questions and concerns.

## 2022-09-12 ENCOUNTER — Other Ambulatory Visit: Payer: Self-pay | Admitting: Cardiovascular Disease

## 2022-09-12 DIAGNOSIS — S0181XD Laceration without foreign body of other part of head, subsequent encounter: Secondary | ICD-10-CM | POA: Diagnosis not present

## 2022-09-12 DIAGNOSIS — R569 Unspecified convulsions: Secondary | ICD-10-CM | POA: Diagnosis not present

## 2022-09-12 DIAGNOSIS — S065X0D Traumatic subdural hemorrhage without loss of consciousness, subsequent encounter: Secondary | ICD-10-CM | POA: Diagnosis not present

## 2022-09-12 DIAGNOSIS — I2510 Atherosclerotic heart disease of native coronary artery without angina pectoris: Secondary | ICD-10-CM | POA: Diagnosis not present

## 2022-09-12 LAB — CBC
HCT: 30.5 % — ABNORMAL LOW (ref 39.0–52.0)
Hemoglobin: 9.8 g/dL — ABNORMAL LOW (ref 13.0–17.0)
MCH: 29.3 pg (ref 26.0–34.0)
MCHC: 32.1 g/dL (ref 30.0–36.0)
MCV: 91.3 fL (ref 80.0–100.0)
Platelets: 405 10*3/uL — ABNORMAL HIGH (ref 150–400)
RBC: 3.34 MIL/uL — ABNORMAL LOW (ref 4.22–5.81)
RDW: 15.2 % (ref 11.5–15.5)
WBC: 9.3 10*3/uL (ref 4.0–10.5)
nRBC: 0 % (ref 0.0–0.2)

## 2022-09-12 LAB — GLUCOSE, CAPILLARY
Glucose-Capillary: 120 mg/dL — ABNORMAL HIGH (ref 70–99)
Glucose-Capillary: 73 mg/dL (ref 70–99)
Glucose-Capillary: 123 mg/dL — ABNORMAL HIGH (ref 70–99)
Glucose-Capillary: 75 mg/dL (ref 70–99)
Glucose-Capillary: 189 mg/dL — ABNORMAL HIGH (ref 70–99)

## 2022-09-12 LAB — MAGNESIUM: Magnesium: 2.1 mg/dL (ref 1.7–2.4)

## 2022-09-12 LAB — BASIC METABOLIC PANEL
Anion gap: 8 (ref 5–15)
BUN: 30 mg/dL — ABNORMAL HIGH (ref 8–23)
CO2: 25 mmol/L (ref 22–32)
Calcium: 8.5 mg/dL — ABNORMAL LOW (ref 8.9–10.3)
Chloride: 99 mmol/L (ref 98–111)
Creatinine, Ser: 0.79 mg/dL (ref 0.61–1.24)
GFR, Estimated: >60 mL/min (ref >60)
Glucose, Bld: 138 mg/dL — ABNORMAL HIGH (ref 70–99)
Potassium: 4.3 mmol/L (ref 3.5–5.1)
Sodium: 132 mmol/L — ABNORMAL LOW (ref 135–145)

## 2022-09-12 NOTE — TOC Progression Note (Signed)
Transition of Care [facility]) - Progression Note    Patient Details  Name: Raymond Sampson MRN: [id number] Date of Birth: 11-30-1940  Transition of Care [facility]) CM/SW Contact  Baldemar Lenis, [place] Phone Number: 09/12/2022, 10:13 AM  Clinical Narrative:   CSW completed referral and sent to [facility]. CSW contacted [employer] to ask them to review. CSW to follow.    Expected Discharge Plan: Skilled Nursing Facility Barriers to Discharge: English as a second language teacher, Continued Medical Work up  Expected Discharge Plan and Services       Living arrangements for the past 2 months: Single Family Home                                       Social Determinants of Health (SDOH) Interventions SDOH Screenings   Food Insecurity: No Food Insecurity (08/29/2022)  Housing: Low Risk  (08/29/2022)  Transportation Needs: No Transportation Needs (08/29/2022)  Utilities: Not At Risk (08/29/2022)  Depression (PHQ2-9): Low Risk  (05/17/2022)  Tobacco Use: Medium Risk (08/18/2022)    Readmission Risk Interventions     No data to display

## 2022-09-12 NOTE — Progress Notes (Signed)
Daily Progress Note   Patient Name: Raymond Sampson       Date: 09/12/2022 DOB: 06-17-1940  Age: 82 y.o. MRN#: [id number] Attending Physician: Rodolph Bong, MD Primary Care Physician: Gracelyn Nurse, MD Admit Date: 08/18/2022  Reason for Consultation/Follow-up: Establishing goals of care  Subjective: Patient asleep in bed in NAD. Wife and daughter at bedside.  Length of Stay: 25  Current Medications: Scheduled Meds:   amantadine  100 mg Oral Daily   amLODipine  10 mg Per Tube Daily   aspirin  81 mg Per Tube Daily   atorvastatin  80 mg Per Tube Daily   carvedilol  12.5 mg Per Tube BID WC   clopidogrel  75 mg Per Tube Daily   enoxaparin (LOVENOX) injection  40 mg Subcutaneous Q24H   feeding supplement (JEVITY 1.5 CAL/FIBER)  1,000 mL Per Tube Q24H   Gerhardt's butt cream   Topical QID   insulin aspart  0-15 Units Subcutaneous Q4H   insulin aspart  3 Units Subcutaneous Q4H   insulin glargine-yfgn  15 Units Subcutaneous Daily   lacosamide  50 mg Per Tube BID   lidocaine  1 patch Transdermal Q24H   multivitamin with minerals  1 tablet Per Tube Daily   mouth rinse  15 mL Mouth Rinse 4 times per day   sertraline  25 mg Per Tube QPM    Continuous Infusions:  sodium chloride 10 mL/hr at 09/11/22 0226    PRN Meds: sodium chloride, acetaminophen (TYLENOL) oral liquid 160 mg/5 mL, [DISCONTINUED] acetaminophen **OR** acetaminophen, clonazepam, guaiFENesin-dextromethorphan, labetalol, [DISCONTINUED] ondansetron **OR** ondansetron (ZOFRAN) IV, mouth rinse  Physical Exam Constitutional:      General: He is sleeping.     Appearance: He is ill-appearing.  HENT:     Nose:     Comments: cortrak Cardiovascular:     Rate and Rhythm: Normal rate.  Pulmonary:     Effort: Pulmonary  effort is normal.  Skin:    General: Skin is warm and dry.  Neurological:     Mental Status: He is lethargic.             Vital Signs: BP 134/65 (BP Location: Right Arm)   Pulse 84   Temp 97.8 F (36.6 C) (Oral)   Resp 18   Ht 5\' 7"  (1.702 m)  Wt 64.4 kg   SpO2 98%   BMI 22.24 kg/m  SpO2: SpO2: 98 % O2 Device: O2 Device: Room Air O2 Flow Rate: O2 Flow Rate (L/min): 2 L/min  Intake/output summary:  Intake/Output Summary (Last 24 hours) at 09/12/2022 1357 Last data filed at 09/12/2022 1000 Gross per 24 hour  Intake 20 ml  Output 1700 ml  Net -1680 ml   LBM: Last BM Date : 09/12/22 Baseline Weight: Weight: 65.2 kg Most recent weight: Weight: 64.4 kg       Palliative Assessment/Data: 20%      Patient Active Problem List   Diagnosis Date Noted   Facial laceration 09/06/2022   Hyponatremia 09/06/2022   SDH (subdural hematoma) (HCC) 09/06/2022   Seizure (HCC) 09/06/2022   Hypokalemia 09/06/2022   Hypophosphatemia 09/06/2022   Cerebrovascular accident (CVA) (HCC) 09/06/2022   Dysphagia 09/06/2022   CAD S/P percutaneous coronary angioplasty 09/06/2022   Protein-calorie malnutrition, severe 08/24/2022   Provoked seizures (HCC) 08/23/2022   Encephalopathy acute 08/23/2022   DNR (do not resuscitate) 08/23/2022   Traumatic subdural hematoma without loss of consciousness (HCC) 08/18/2022   Fall 06/22/2022   Malnutrition of moderate degree 06/22/2022   Acute blood loss anemia 06/21/2022   Multiple rib fractures involving four or more ribs, right side 06/20/2022   Intramuscular hematoma 06/19/2022   Rib fractures 06/19/2022   Hemothorax on right 06/19/2022   Leukocytosis 06/19/2022   Status post coronary artery stent placement 05/02/2022   Mild dementia (HCC) 04/14/2022   Non-ST elevation (NSTEMI) myocardial infarction (HCC) 04/13/2022   History of COVID-19 04/13/2022   Lung nodule 04/13/2022   PAD (peripheral artery disease) (HCC) 04/13/2022   Flu 06/05/2017    Memory loss, short term 02/07/2016   History of CVA (cerebrovascular accident) 02/07/2016   Diabetes mellitus type 2, uncomplicated (HCC) 04/06/2015   Carotid stenosis 05/14/2013   CAROTID BRUIT, LEFT 04/25/2010   PALPITATIONS 06/18/2009   Hyperlipidemia 04/27/2009   Essential hypertension 04/27/2009   Ischemic heart disease 04/27/2009    Palliative Care Assessment & Plan   Patient Profile: 82 y.o. male with past medical history of CAD, stroke, DM2, HTN, HLD admitted on 08/18/2022 after a fall at his home with a large right-sided subdural hematoma. He was initially alert and oriented but on 08/21/22 he he became more drowsy and was only able to intermittently follow commands and intermittently answer questions. EEG to evaluate for seizure.      Assessment: Patient is in new bed. His wife contributes his lethargy to his moving from the old bed to new bed. Last night was the first overnight feed. She reports it went well. He was able to eat his entire waffle and some eggs this morning. Calorie count in progress.   Patient and family face ongoing treatment option decisions, advance directive decisions and anticipatory care needs-- especially around eating/nutrition. Patient has stated in the past that he did not want a PEG.  Recommendations/Plan: DNR Patient has stated in the past he does not want a PEG tube. Calorie count in place in order to assess nutritional intake Allow time for outcomes Encourage family to continue conversations around [facility]  Goals of Care and Additional Recommendations: Limitations on Scope of Treatment: Use MOST form for guidance  Code Status:    Code Status Orders  (From admission, onward)           Start     Ordered   08/19/22 0022  Do not attempt resuscitation (DNR)  Continuous  Question Answer Comment  If patient has no pulse and is not breathing Do Not Attempt Resuscitation   If patient has a pulse and/or is breathing: Medical Treatment Goals  COMFORT MEASURES: Keep clean/warm/dry, use medication by any route; positioning, wound care and other measures to relieve pain/suffering; use oxygen, suction/manual treatment of airway obstruction for comfort; do not transfer unless for comfort needs.   Consent: Discussion documented in EHR or advanced directives reviewed      08/19/22 0023           Code Status History     Date Active Date Inactive Code Status Order ID Comments User Context   08/18/2022 2105 08/19/2022 0023 Full Code [id number]  Vonna Drafts ED   06/28/2022 1422 07/03/2022 2122 DNR [id number]  Morton Stall, NP Inpatient   06/19/2022 1721 06/28/2022 1422 Full Code [id number]  Floydene Flock, MD ED   05/02/2022 1135 05/03/2022 1523 Full Code [id number]  Yvonne Kendall, MD Inpatient   04/13/2022 0048 04/15/2022 2115 Full Code [id number]  Nolberto Hanlon, MD ED   06/05/2017 2015 06/07/2017 1757 Full Code [id number]  Ihor Austin, MD Inpatient       Prognosis:  Unable to determine  Discharge Planning: To Be Determined    Thank you for allowing the Palliative Medicine Team to assist in the care of this patient.  Time spent: 40 minutes   Detailed review of medical records ( labs, imaging, vital signs), medically appropriate exam, counseling and education to care partner, documenting clinical information, medication management, coordination of care.    Sherryll Burger, NP  Please contact Palliative Medicine Team phone at [phone] for questions and concerns.

## 2022-09-12 NOTE — Progress Notes (Signed)
PROGRESS NOTE    Raymond Sampson  BJY:[id number] DOB: 1940/11/20 DOA: 08/18/2022 PCP: Gracelyn Nurse, MD    Chief Complaint  Patient presents with   Marletta Lor on Thinners    Brief Narrative:  82 year old M with PMH of CAD, CVA, HTN, HLD, DM-2, cervical spine fusion and OA presented to ED after fall at home and found to have traumatic right frontal SDH measuring 2.2 cm in maximum thickness with 5 to 6 mm midline shift to the left, and trace SAH at the right temporal lobe.  Patient was on Plavix and aspirin.  CT cervical spine without acute finding but some sclerotic changes at C2-C3 facet joint.  He was admitted to ICU by neurosurgery. He was initially minimally symptomatic and refused surgery.  However, he developed intermittent focal seizure.    Repeat CT head on 5/18 with stable SDH and SAH.  MRI brain showed multiple small/punctate areas of restricted diffusion with bilateral frontal and parietal cortex suspicious for tiny acute infarcts or small intraparenchymal contusion and/or shear type injury in the setting of trauma.  EEG on 5/21 showed intermittent seizure.  Neurology consulted.  Patient was started on Keppra and Vimpat.  Palliative medicine consulted as well.   Patient was transferred to hospitalist service on 5/23.  Subsequent LTM EEG without seizure.  Neurology signed off.  Encephalopathy improved but seems to be delirious.  Remains NPO except some pured diet from nursing stock.  He is on tube feed via cortrack.  SLP following.  Therapy recommended SNF. Patient remains very sleepy and lethargic, SLP unable to assess him due to sleepiness. Repeat CT head without contrast showed unchanged subdural hematoma.   Stopped keppra altogether today. LTM eeg Discontinued.    Assessment & Plan:   Principal Problem:   Traumatic subdural hematoma without loss of consciousness (HCC) Active Problems:   Provoked seizures (HCC)   Encephalopathy acute   DNR (do not resuscitate)    Protein-calorie malnutrition, severe   Facial laceration   Hyponatremia   SDH (subdural hematoma) (HCC)   Seizure (HCC)   Hypokalemia   Hypophosphatemia   Cerebrovascular accident (CVA) (HCC)   Dysphagia   CAD S/P percutaneous coronary angioplasty   #1 traumatic right subdural hematoma/subarachnoid hemorrhage with midline shift secondary to fall and antiplatelet therapy -Patient presented with a fall found to have a traumatic right subdural hematoma/subarachnoid hemorrhage with midline shift. -Repeat head CT done showed a stable hematoma. -Patient assessed by neurosurgery who recommended no surgical intervention at that time. -Recommendations were to hold aspirin and Plavix for a week and subsequently these have been resumed. -PT/OT recommended SNF. -Patient still with cortrack has been assessed by SLP and patient started on a diet.  -The evening of (09/06/2022) due to concerns for change in pupil size, repeat head CT was done with no significant change. -Neurosurgery recommended outpatient follow-up.  2.  Fall at home, left frontal laceration -Status post repair with stitches. -Sutures removed 08/27/2022  3.  Provoked seizures -Felt secondary to right subdural hematoma and subarachnoid hemorrhage with midline shift. -EEG done on 09/01/2022 with seizures. -Patient had subsequent LTM EEG with evidence of epileptogenicity arising from the right central parietal region. -Patient was started on AEDs. -Patient noted to have increased lethargy, neurology reconsulted he was on LTM EEG which did not show any seizure activity. -Neurology initially decreased Keppra dose and finally discontinued it to minimize sedation. -Neurology recommended continuation of current dose of Vimpat at 50 twice daily and if continued lethargy may  need amantadine. -Amantadine started 09/09/2022. -Per neurology patient at risk for more seizures. -Neurology following.  4.  Acute metabolic encephalopathy -Likely  secondary to subdural hematoma, provoked seizures in the setting of memory deficits. -Avoid sedating medications or minimize sedating medications. -Patient with some improvement with discontinuation of Keppra and decreased dose of Vimpat. -Patient noted to be intermittently awake and eating some food from floor stock component early on in the hospitalization. -TSH done within normal limits, ammonia within normal limits. -Repeat head CT done showed unchanged subdural hematoma. -Urinalysis unremarkable. -Chest x-ray negative for any infiltrate. -Due to increased lethargy earlier on during the hospitalization neurology reconsulted, patient was on LTM EEG which did not show any seizure activity and as such neurology decreased dose of Keppra initially and subsequently discontinued it to minimize sedation.  Vimpat dose also decreased to 50 mg twice daily. -With decreased AEDs, likely at risk for more seizures. -Due to ongoing lethargy neurology had recommended starting patient on amantadine. -Family noted had been initially hesitant about starting amantadine however due to patient's lethargy the morning of 09/09/2022 and drowsiness wife decided to go ahead and patient subsequently started on amantadine 09/09/2022.   -Per wife patient more alert and more interactive after initiation of amantadine. -Neurology was following but have signed off as of 09/08/2022.  5.  Back pain -Likely secondary to fall. -X-rays are negative for any acute findings. -Lidoderm patch, pain control, PT/OT.  6.  Acute blood loss anemia -Patient with no overt bleeding. -No significant iron deficiency on anemia panel. -Status post transfusion 2 units PRBCs. -Hemoglobin currently stable at 9.8.  7.  CAD status post PCI/DES stent to LCx/OM 2 in January 2024. -Coreg, Lipitor, Plavix, aspirin.   8.  Well-controlled non-insulin-dependent diabetes mellitus -Hemoglobin A1c 6.0 (08/21/2022) -CBG 73 this morning. -Continue Semglee 15  units daily, NovoLog 3 units every 4 hours, SSI.  9.  Hypertension -Coreg, Norvasc.       10.  Hypokalemia/hypophosphatemia -Repleted.    11.  History of CVA -Continue Plavix, aspirin, Lipitor for secondary stroke prophylaxis.   12.  Hyponatremia Resolved.  13.  Dysphagia -Patient on honey thickened pure per nursing. -Assessed by SLP who recommended tube feeds for nutritional supplementation. -Patient noted not to be alert enough to pass it early on in the hospitalization.  With SLP evaluation, improved mental status and alertness after discontinuation of Keppra and decreasing Vimpat. -SLP following and patient placed on a dysphagia 1 diet with honey thick liquids.. -Family have decided no PEG tube placement at this time. -Palliative care following.  14.  Coughing and congestion -Chest x-ray no acute infiltrate. -Robitussin as needed. -Wife concerned about congestion and possible aspiration and requesting further evaluation. -Repeat chest x-ray done 09/10/2022, with no acute infiltrate..  15.  Mood disorder -Zoloft.    16.  Severe protein calorie malnutrition -Currently on tube feeds via cortrack. -Family noted to have decided earlier on the hospitalization for no PEG tube placement. -Patient close significantly drowsy early on in the hospitalization and felt likely playing a role in patient's oral intake and nutrition.  -Continue nutritional supplementation. -Tube feeds changed to nocturnal feeds. -Currently on tube feeds at a rate of 30 mL/h. -Patient seen by SLP and patient placed on a dysphagia 1 diet with honey thick liquids. -Dietitian following, calorie count underway.     DVT prophylaxis: SCDs Code Status: DNR Family Communication: Updated wife at bedside. Disposition: Likely SNF once tolerating adequate oral intake and not requiring tube feeds via  cortrack.    Status is: Inpatient Remains inpatient appropriate because: Severity of illness/unsafe disposition    Consultants:  Neurosurgery: Dr. Maurice Small 08/18/2022 Neurology: Dr. Selina Cooley 08/21/2022 Palliative care: Dr. Patterson Hammersmith 08/22/2022  Procedures:  CT head CT C-spine 08/18/2022 CT maxillofacial 08/18/2022 CT head 08/19/2022, 08/20/2022, 09/01/2022 CT angiogram head and neck 08/21/2022 Chest x-ray 09/03/2022, 09/10/2022 plain films of the left hip and pelvis 08/24/2022 MRI brain 08/20/2022 2D echo 08/22/2022 LTM EEG  Modified barium swallow 08/21/2022 Transfusion 2 units PRBCs 08/25/2022 CT head 09/07/2022  Antimicrobials:  Anti-infectives (From admission, onward)    None         Subjective: Eyes closed, opens eyes to verbal stimuli but closes them back up.  No chest pain.  No shortness of breath.  No abdominal pain.  Wife at bedside states patient was alert this morning when she came in and he ate most of his waffle.  Per wife patient was alert yesterday afternoon.    Objective: Vitals:   09/11/22 2354 09/12/22 0351 09/12/22 0454 09/12/22 0945  BP: 132/73 126/66  134/65  Pulse: 75 78  84  Resp: 16 16  18   Temp: 99.4 F (37.4 C) 99.2 F (37.3 C)    TempSrc:      SpO2: 95% 97%  98%  Weight:   64.4 kg   Height:        Intake/Output Summary (Last 24 hours) at 09/12/2022 1111 Last data filed at 09/12/2022 1000 Gross per 24 hour  Intake --  Output 2400 ml  Net -2400 ml    Filed Weights   09/09/22 0500 09/10/22 0745 09/12/22 0454  Weight: 66.7 kg 65.4 kg 64.4 kg    Examination:  General exam: Alert.  Cortrack in place. Respiratory system: CTAB.  No wheezes, no crackles, no rhonchi.  Fair air movement.  Cardiovascular system: RRR no murmurs rubs or gallops.  No JVD.  No lower extremity edema.   Gastrointestinal system: Abdomen is soft, nontender, nondistended, positive bowel sounds.  No rebound.  No guarding. Central nervous system: Awake.  Alert.  Moving extremities spontaneously.  Extremities: Symmetric 5 x 5 power. Skin: No rashes, lesions or ulcers Psychiatry: Judgement and insight  poor to fair..  Mood & affect appropriate.     Data Reviewed: I have personally reviewed following labs and imaging studies  CBC: Recent Labs  Lab 09/06/22 1020 09/07/22 0851 09/08/22 1318 09/09/22 0323  WBC 12.6* 10.4 9.9 8.7  NEUTROABS 9.5*  --   --   --   HGB 9.6* 10.2* 9.8* 9.5*  HCT 30.4* 31.8* 30.6* 30.0*  MCV 94.4 94.4 92.7 92.9  PLT 442* 431* 421* 393     Basic Metabolic Panel: Recent Labs  Lab 09/06/22 1020 09/07/22 0851 09/08/22 1318 09/09/22 0323 09/11/22 0710  NA 135 134* 134* 136 131*  K 4.2 4.0 3.9 4.0 3.8  CL 102 103 101 103 98  CO2 25 23 26 26 25   GLUCOSE 150* 176* 143* 106* 111*  BUN 55* 47* 39* 35* 27*  CREATININE 0.82 0.67 0.73 0.70 0.76  CALCIUM 8.8* 8.5* 8.5* 8.4* 8.3*  MG 2.1 1.9  --  2.0  --   PHOS 3.3 2.9  --  3.1  --      GFR: Estimated Creatinine Clearance: 64.8 mL/min (by C-G formula based on SCr of 0.76 mg/dL).  Liver Function Tests: Recent Labs  Lab 09/06/22 1020 09/07/22 0851 09/09/22 0323  ALBUMIN 2.3* 2.4* 2.2*     CBG: Recent Labs  Lab  09/11/22 1720 09/11/22 2009 09/11/22 2352 09/12/22 0351 09/12/22 0831  GLUCAP 89 183* 180* 120* 73      No results found for this or any previous visit (from the past 240 hour(s)).       Radiology Studies: DG CHEST PORT 1 VIEW  Result Date: 09/10/2022 CLINICAL DATA:  Cough EXAM: PORTABLE CHEST 1 VIEW COMPARISON:  09/03/2022 x-ray FINDINGS: Enteric tube with tip extending beneath the diaphragm. Fixation hardware along the lower cervical spine at the edge of the imaging field. Minimal right basilar atelectasis. Smaller area in the left lung base as well. No separate consolidation, pneumothorax, effusion or edema. Normal cardiopericardial silhouette. IMPRESSION: Enteric tube in place. Mild basilar atelectasis. Electronically Signed   By: Karen Kays M.D.   On: 09/10/2022 19:56        Scheduled Meds:  amantadine  100 mg Oral Daily   amLODipine  10 mg Per Tube Daily    aspirin  81 mg Per Tube Daily   atorvastatin  80 mg Per Tube Daily   carvedilol  12.5 mg Per Tube BID WC   clopidogrel  75 mg Per Tube Daily   enoxaparin (LOVENOX) injection  40 mg Subcutaneous Q24H   feeding supplement (JEVITY 1.5 CAL/FIBER)  1,000 mL Per Tube Q24H   Gerhardt's butt cream   Topical QID   insulin aspart  0-15 Units Subcutaneous Q4H   insulin aspart  3 Units Subcutaneous Q4H   insulin glargine-yfgn  15 Units Subcutaneous Daily   lacosamide  50 mg Per Tube BID   lidocaine  1 patch Transdermal Q24H   multivitamin with minerals  1 tablet Per Tube Daily   mouth rinse  15 mL Mouth Rinse 4 times per day   sertraline  25 mg Per Tube QPM   Continuous Infusions:  sodium chloride 10 mL/hr at 09/11/22 0226     LOS: 25 days    Time spent: 35 minutes    Ramiro Harvest, MD [facility]   To contact the attending provider between 7A-7P or the covering provider during after hours 7P-7A, please log into the web site [email or web address] and access using universal [place] password for that web site. If you do not have the password, please call the hospital operator.  09/12/2022, 11:11 AM

## 2022-09-12 NOTE — [place] FL2 (Signed)
[place] MEDICAID FL2 LEVEL OF CARE FORM     IDENTIFICATION  Patient Name: Raymond Sampson Birthdate: [birth year] Sex: male Admission Date (Current Location): 08/18/2022  [facility] and [place] Number:  Chiropodist and Address:  [facility], [address] [place], [place] [address]      Provider Number: [id number]  Attending Physician Name and Address:  Rodolph Bong, MD  Relative Name and Phone Number:       Current Level of Care: Hospital Recommended Level of Care: Skilled Nursing Facility Prior Approval Number:    Date Approved/Denied:   PASRR Number: [id number]  Discharge Plan: SNF    Current Diagnoses: Patient Active Problem List   Diagnosis Date Noted   Facial laceration 09/06/2022   Hyponatremia 09/06/2022   SDH (subdural hematoma) (HCC) 09/06/2022   Seizure (HCC) 09/06/2022   Hypokalemia 09/06/2022   Hypophosphatemia 09/06/2022   Cerebrovascular accident (CVA) (HCC) 09/06/2022   Dysphagia 09/06/2022   CAD S/P percutaneous coronary angioplasty 09/06/2022   Protein-calorie malnutrition, severe 08/24/2022   Provoked seizures (HCC) 08/23/2022   Encephalopathy acute 08/23/2022   DNR (do not resuscitate) 08/23/2022   Traumatic subdural hematoma without loss of consciousness (HCC) 08/18/2022   Fall 06/22/2022   Malnutrition of moderate degree 06/22/2022   Acute blood loss anemia 06/21/2022   Multiple rib fractures involving four or more ribs, right side 06/20/2022   Intramuscular hematoma 06/19/2022   Rib fractures 06/19/2022   Hemothorax on right 06/19/2022   Leukocytosis 06/19/2022   Status post coronary artery stent placement 05/02/2022   Mild dementia (HCC) 04/14/2022   Non-ST elevation (NSTEMI) myocardial infarction (HCC) 04/13/2022   History of COVID-19 04/13/2022   Lung nodule 04/13/2022   PAD (peripheral artery disease) (HCC) 04/13/2022   Flu 06/05/2017   Memory loss, short term 02/07/2016   History of CVA  (cerebrovascular accident) 02/07/2016   Diabetes mellitus type 2, uncomplicated (HCC) 04/06/2015   Carotid stenosis 05/14/2013   CAROTID BRUIT, LEFT 04/25/2010   PALPITATIONS 06/18/2009   Hyperlipidemia 04/27/2009   Essential hypertension 04/27/2009   Ischemic heart disease 04/27/2009    Orientation RESPIRATION BLADDER Height & Weight     Self  Normal Incontinent Weight: 141 lb 15.6 oz (64.4 kg) Height:  5\' 7"  (170.2 cm)  BEHAVIORAL SYMPTOMS/MOOD NEUROLOGICAL BOWEL NUTRITION STATUS    Convulsions/Seizures Incontinent Diet (see DC summary)  AMBULATORY STATUS COMMUNICATION OF NEEDS Skin   Extensive Assist Verbally Skin abrasions (skin tear, face: no dressing; skin tear, coccyx: no dressing)                       Personal Care Assistance Level of Assistance  Bathing, Feeding, Dressing Bathing Assistance: Maximum assistance Feeding assistance: Maximum assistance Dressing Assistance: Maximum assistance     Functional Limitations Info  Sight, Hearing, Speech Sight Info: Impaired Hearing Info: Impaired Speech Info: Impaired    SPECIAL CARE FACTORS FREQUENCY  PT (By licensed PT), OT (By licensed OT), Speech therapy     PT Frequency: 5x/wk OT Frequency: 5x/wk     Speech Therapy Frequency: 5x/wk      Contractures Contractures Info: Not present    Additional Factors Info  Code Status, Allergies, Psychotropic, Insulin Sliding Scale Code Status Info: DNR Allergies Info: Metformin And Related, Simvastatin Psychotropic Info: Zoloft 25mg  every evening Insulin Sliding Scale Info: see DC summary       Current Medications (09/12/2022):  This is the current hospital active medication list Current  Facility-Administered Medications  Medication Dose Route Frequency Provider Last Rate Last Admin   0.9 %  sodium chloride infusion   Intravenous PRN Almon Hercules, MD 10 mL/hr at 09/11/22 0226 New Bag at 09/11/22 0226   acetaminophen (TYLENOL) 160 MG/5ML solution 650 mg  650 mg  Per Tube Q6H PRN Candelaria Stagers T, MD   650 mg at 09/10/22 0943   acetaminophen (TYLENOL) suppository 650 mg  650 mg Rectal Q4H PRN [place] Sizer, PA-C       amantadine (SYMMETREL) capsule 100 mg  100 mg Oral Daily Hammons, Kimberly B, RPH   100 mg at 09/11/22 1005   amLODipine (NORVASC) tablet 10 mg  10 mg Per Tube Daily Candelaria Stagers T, MD   10 mg at 09/11/22 1005   aspirin chewable tablet 81 mg  81 mg Per Tube Daily Gonfa, Taye T, MD   81 mg at 09/11/22 1005   atorvastatin (LIPITOR) tablet 80 mg  80 mg Per Tube Daily Gonfa, Taye T, MD   80 mg at 09/11/22 1005   carvedilol (COREG) tablet 12.5 mg  12.5 mg Per Tube BID WC Gonfa, Taye T, MD   12.5 mg at 09/11/22 1804   clonazePAM (KLONOPIN) disintegrating tablet 2 mg  2 mg Oral PRN Charlsie Quest, MD       clopidogrel (PLAVIX) tablet 75 mg  75 mg Per Tube Daily Gonfa, Taye T, MD   75 mg at 09/11/22 1005   enoxaparin (LOVENOX) injection 40 mg  40 mg Subcutaneous Q24H Gonfa, Taye T, MD   40 mg at 09/12/22 1005   feeding supplement (JEVITY 1.5 CAL/FIBER) liquid 1,000 mL  1,000 mL Per Tube Q24H Rodolph Bong, MD   1,000 mL at 09/11/22 1858   Gerhardt's butt cream   Topical QID Kathlen Mody, MD   Given at 09/11/22 2143   guaiFENesin-dextromethorphan (ROBITUSSIN DM) 100-10 MG/5ML syrup 10 mL  10 mL Oral Q4H PRN Kathlen Mody, MD       insulin aspart (novoLOG) injection 0-15 Units  0-15 Units Subcutaneous Q4H Candelaria Stagers T, MD   3 Units at 09/12/22 0007   insulin aspart (novoLOG) injection 3 Units  3 Units Subcutaneous Q4H Kathlen Mody, MD   3 Units at 09/12/22 0452   insulin glargine-yfgn (SEMGLEE) injection 15 Units  15 Units Subcutaneous Daily Candelaria Stagers T, MD   15 Units at 09/12/22 1005   labetalol (NORMODYNE) injection 10 mg  10 mg Intravenous Q10 min PRN Candelaria Stagers T, MD       lacosamide (VIMPAT) tablet 50 mg  50 mg Per Tube BID Charlsie Quest, MD   50 mg at 09/11/22 2143   lidocaine (LIDODERM) 5 % 1 patch  1 patch Transdermal Q24H  Candelaria Stagers T, MD   1 patch at 09/07/22 1235   multivitamin with minerals tablet 1 tablet  1 tablet Per Tube Daily Agarwala, Daleen Bo, MD   1 tablet at 09/11/22 1005   ondansetron (ZOFRAN) injection 4 mg  4 mg Intravenous Q4H PRN [place] Sizer, PA-C       Oral care mouth rinse  15 mL Mouth Rinse 4 times per day Kathlen Mody, MD   15 mL at 09/12/22 0800   Oral care mouth rinse  15 mL Mouth Rinse PRN Kathlen Mody, MD       sertraline (ZOLOFT) tablet 25 mg  25 mg Per Tube QPM Candelaria Stagers T, MD   25 mg at 09/11/22 1803  Discharge Medications: Please see discharge summary for a list of discharge medications.  Relevant Imaging Results:  Relevant Lab Results:   Additional Information SS#: [id number]  Baldemar Lenis, LCSW

## 2022-09-12 NOTE — Progress Notes (Signed)
Physical Therapy Treatment Patient Details Name: Raymond Sampson MRN: [id number] DOB: [birth year] Today's Date: 09/12/2022   History of Present Illness 82 year old male admitted 08/18/22 after fall at home. CT head 5/18 revealed: large Right Subdural Hematoma, most pronounced along the anterior right frontal convexity.  Developed increased lethargy and change in pupilary size on 5/16 and MRI sowed multiple punctate areas of restricted diffusion in bilateral frontal and parietal cortex suspicious for tiny acute infarcts. LTM EEG positive for nonconvulsive seizures. Pt with history of CAD, prior stroke, hypertension, hyperlipidemia, type 2 diabetes, prior fall in March this year with SNF stay, chronic antiplatelet use with aspirin and Plavix.    PT Comments    Coordinated with nursing staff to obtain an air mattress for this pt today as pt already has some buttocks wounds and is not very mobile and at high risk for further skin integrity issues. Session focused around transferring pt OOB to the recliner and then transferring pt to his new air mattress bed. Pt was more lethargic today but did follow cues more consistently for feet placement once sitting EOB today. He continues to display poor awareness of his posterior and L lateral lean when sitting EOB, but after extensive cuing this did improve. As he fatigued, he began to lean too far anteriorly then, needing blocking. Attempted use of the stedy to reduce pt's fear of falling, but this was unsuccessful. Pt ultimately required face-to-face approach with bil knees blocked to stand/squat pivot bed <> recliner with maxA. Will continue to follow acutely.      Recommendations for follow up therapy are one component of a multi-disciplinary discharge planning process, led by the attending physician.  Recommendations may be updated based on patient status, additional functional criteria and insurance authorization.  Follow Up Recommendations  Can patient  physically be transported by private vehicle: No    Assistance Recommended at Discharge Frequent or constant Supervision/Assistance  Patient can return home with the following Two people to help with walking and/or transfers;Two people to help with bathing/dressing/bathroom;Assist for transportation;Help with stairs or ramp for entrance;Direct supervision/assist for medications management;Direct supervision/assist for financial management;Assistance with cooking/housework;Assistance with feeding   Equipment Recommendations  Wheelchair (measurements PT);Wheelchair cushion (measurements PT);Hospital bed;Other (comment) (air mattress; hoyer lift; if goes home instead)    Recommendations for Other Services       Precautions / Restrictions Precautions Precautions: Fall Precaution Comments: Seizures; cortrak; buttocks wounds Restrictions Weight Bearing Restrictions: No     Mobility  Bed Mobility Overal bed mobility: Needs Assistance Bed Mobility: Supine to Sit, Sit to Supine     Supine to sit: Max assist, HOB elevated Sit to supine: Max assist, HOB elevated   General bed mobility comments: Pt needing verbal and tactile cues along with physical guidance to bring each leg off L EOB and HHA along with maxA to ascend trunk to sit up L EOB. MaxA to pivot hips and trunk and lift legs back to supine.    Transfers Overall transfer level: Needs assistance Equipment used: 1 person hand held assist, Ambulation equipment used Transfers: Sit to/from Stand, Bed to chair/wheelchair/BSC Sit to Stand: Max assist Stand pivot transfers: Total assist   Squat pivot transfers: Total assist     General transfer comment: Attempted for pt to pull up on stedy to stand from EOB in order to improve his confidence and decrease fear of falling, giving him multiple attempts to do it with less tactile input as pt tends to resist when provided physical assistance,  but unable to clear buttocks. Transitioned to  face-to-face approach with pt's holding onto therapist's trunk with bil UEs and his bil knees blocked to stand from EOB and pivot to L to recliner, maxA. Same approach to assist pt to squat pivot to R to new air mattress bed from recliner, maxA. Transfer via Lift Equipment: Stedy  Ambulation/Gait               General Gait Details: unable   Social research officer, government Rankin (Stroke Patients Only) Modified Rankin (Stroke Patients Only) Pre-Morbid Rankin Score: Moderate disability Modified Rankin: Severe disability     Balance Overall balance assessment: Needs assistance Sitting-balance support: Single extremity supported, Feet supported, Bilateral upper extremity supported Sitting balance-Leahy Scale: Poor Sitting balance - Comments: Pt with L lateral and posterior lean at EOB, needing modA for static sitting balance. Eventually progressed to min guard-minA after extensive cuing to flex at hips. Pt then began to lean too far anteriorly, resting his head close to the stedy bar after a while, needing blocking to ensure pt safety. Postural control: Posterior lean, Left lateral lean, Other (comment) (anterior) Standing balance support: Bilateral upper extremity supported Standing balance-Leahy Scale: Zero Standing balance comment: max A needed to stand with knees blocked                            Cognition Arousal/Alertness: Lethargic Behavior During Therapy: Anxious Overall Cognitive Status: Impaired/Different from baseline Area of Impairment: Attention, Memory, Following commands, Safety/judgement, Problem solving, Awareness                   Current Attention Level: Focused Memory: Decreased short-term memory Following Commands: Follows one step commands inconsistently, Follows one step commands with increased time Safety/Judgement: Decreased awareness of safety, Decreased awareness of deficits Awareness:  Intellectual Problem Solving: Slow processing, Decreased initiation, Difficulty sequencing, Requires verbal cues, Requires tactile cues General Comments: Pt more lethargic today, needing cues to open his eyes to maintain his attention to task at hand. He did follow cues more consistently for feet movement/placement once sitting EOB today though. However, poor initiation still for bed mobility and pt often resistive or not giving full effort for transfers, seemingly due to fear of falling. Poor awareness of his leaning and safety with sitting balance.        Exercises      General Comments        Pertinent Vitals/Pain Pain Assessment Pain Assessment: Faces Faces Pain Scale: Hurts little more Pain Location: generalized Pain Descriptors / Indicators: Discomfort, Grimacing, Guarding, Moaning Pain Intervention(s): Limited activity within patient's tolerance, Monitored during session, Repositioned    Home Living                          Prior Function            PT Goals (current goals can now be found in the care plan section) Acute Rehab PT Goals Patient Stated Goal: agreeable to session PT Goal Formulation: With patient/family Time For Goal Achievement: 09/20/22 Potential to Achieve Goals: Fair Progress towards PT goals: Progressing toward goals    Frequency    Min 3X/week      PT Plan Current plan remains appropriate    Co-evaluation              AM-PAC PT "6  Clicks" Mobility   Outcome Measure  Help needed turning from your back to your side while in a flat bed without using bedrails?: A Lot Help needed moving from lying on your back to sitting on the side of a flat bed without using bedrails?: A Lot Help needed moving to and from a bed to a chair (including a wheelchair)?: A Lot Help needed standing up from a chair using your arms (e.g., wheelchair or bedside chair)?: A Lot Help needed to walk in hospital room?: Total Help needed climbing 3-5  steps with a railing? : Total 6 Click Score: 10    End of Session Equipment Utilized During Treatment: Gait belt Activity Tolerance: Patient limited by lethargy Patient left: in bed;with call bell/phone within reach;with bed alarm set;with family/visitor present Nurse Communication: Mobility status PT Visit Diagnosis: Other symptoms and signs involving the nervous system (R29.898);Muscle weakness (generalized) (M62.81);Other abnormalities of gait and mobility (R26.89);Unsteadiness on feet (R26.81);Difficulty in walking, not elsewhere classified (R26.2)     Time: 1027-2536 PT Time Calculation (min) (ACUTE ONLY): 41 min  Charges:  $Therapeutic Activity: 38-52 mins                     Raymond Gurney, PT, DPT Acute Rehabilitation Services  Office: [phone]    Jewel Baize 09/12/2022, 1:19 PM

## 2022-09-12 NOTE — Progress Notes (Signed)
Calorie Count Note  48-hour calorie count ordered.  Reviewed tickets over the last 24 hours. Pt not meeting needs at this time and taking in very little fluids. Will continue to monitor intake for 24 more hours to provide family with objective data to support GOC discussions.  Relayed information to MD and PMT.  Diet: DYS1, honey thick Supplements: Magic Cup BID with meals  Estimated Nutritional Needs:  Kcal:  1600-1800 kcal/d Protein:  80-100g/d Fluid:  >1.7 L/day  6/10 Breakfast: 250kcal, 17g of protein 6/10 Lunch: 147kcal, 10g protein 6/10 Dinner: 298 kcal, 18g of protein  Total intake: 695 kcal (43% of minimum estimated needs)  45 protein (56% of minimum estimated needs)  NUTRITION DIAGNOSIS:  Severe Malnutrition related to chronic illness (fall) as evidenced by severe fat depletion, severe muscle depletion. - remains applicable   GOAL:  Patient will meet greater than or equal to 90% of their needs - progressing, being met with TF at goal  INTERVENTION:  Continue diet per SLP recommendation Feeding assistance Continue nocturnal TF: Jevity 1.5 at 75 ml/hr x 12 hours (900 ml per day) Provides 1350 kcal, 57 gm protein, 684 ml free water daily MVI with minerals daily Continue Calorie Count x 24 more hours   Greig Castilla, RD, LDN Clinical Dietitian RD pager # available in [facility]  After hours/weekend pager # available in [facility]

## 2022-09-13 DIAGNOSIS — S065X0D Traumatic subdural hemorrhage without loss of consciousness, subsequent encounter: Secondary | ICD-10-CM | POA: Diagnosis not present

## 2022-09-13 LAB — GLUCOSE, CAPILLARY
Glucose-Capillary: 145 mg/dL — ABNORMAL HIGH (ref 70–99)
Glucose-Capillary: 120 mg/dL — ABNORMAL HIGH (ref 70–99)
Glucose-Capillary: 158 mg/dL — ABNORMAL HIGH (ref 70–99)
Glucose-Capillary: 110 mg/dL — ABNORMAL HIGH (ref 70–99)
Glucose-Capillary: 172 mg/dL — ABNORMAL HIGH (ref 70–99)
Glucose-Capillary: 181 mg/dL — ABNORMAL HIGH (ref 70–99)

## 2022-09-13 NOTE — Plan of Care (Signed)

## 2022-09-13 NOTE — TOC Progression Note (Addendum)
Transition of Care [facility]) - Progression Note    Patient Details  Name: Raymond Sampson MRN: [id number] Date of Birth: [birth year]  [facility]) CM/SW Contact  Baldemar Lenis, [place] Phone Number: 09/13/2022, 11:16 AM  Clinical Narrative:   CSW met with patient's spouse at bedside to discuss SNF offers. [facility] has declined, spouse would like to know why. CSW asked [facility] for specifics on denial, awaiting response. Spouse to review options available. Spouse interested in [place]. CSW contacted [facility] to ask them to review. CSW to follow.  UPDATE: CSW received update from [facility] that they would review referral again, CSW sent to them for review. CSW received call from [facility] that they are full and unable to offer. CSW to follow.    Expected Discharge Plan: Skilled Nursing Facility Barriers to Discharge: English as a second language teacher, Continued Medical Work up  Expected Discharge Plan and Services       Living arrangements for the past 2 months: Single Family Home                                       Social Determinants of Health (SDOH) Interventions SDOH Screenings   Food Insecurity: No Food Insecurity (08/29/2022)  Housing: Low Risk  (08/29/2022)  Transportation Needs: No Transportation Needs (08/29/2022)  Utilities: Not At Risk (08/29/2022)  Depression (PHQ2-9): Low Risk  (05/17/2022)  Tobacco Use: Medium Risk (08/18/2022)    Readmission Risk Interventions     No data to display

## 2022-09-13 NOTE — Progress Notes (Signed)
Speech Language Pathology Treatment: Dysphagia  Patient Details Name: Raymond Sampson MRN: [id number] DOB: 1940/09/04 Today's Date: 09/13/2022 Time: 7829-5621 SLP Time Calculation (min) (ACUTE ONLY): 16 min  Assessment / Plan / Recommendation Clinical Impression  Pt seen for dysphagia f/u tx session with recommended diet (breakfast tray) of Dysphagia 1(puree)/honey-thickened liquids with wife in attendance and feeding pt for majority of session.  Pt is now on calorie count for potential removal of Cortrak if able to maintain adequate nutrition/hydration.  Wife administered honey-thickened liquids via occluding straw with 1/2 tsp amounts and via tsp with slow bolus formation/propulsion, delay in the initiation of the swallow and min verbal cues for repetitive swallows and intermittent throat clearing from SLP.  Discussed importance of following swallowing precautions during administration of po intake to maximize safety measures during swallowing.  Pt was alert for swallowing tx, but this has waxed/waned in the past which would also place him at risk for aspiration.  Pt with hypophonic vocal quality, but intelligible short phrases observed.  Wife reported wet vocal quality intermittently, so swallowing precautions discussed for strategies to alleviate this occurrence including repeating swallows, tsp size for food/liquids and mentation being increased during po consumption.  Wife in agreement.  ST will continue to f/u for diet tolerance/education briefly during acute stay.    HPI HPI: 82 year old male who presented for fall. Hit forehead with laceration repair, no LOC, no facial fx, but CT 5/18 revealed: "stable large, approximately 17 mm thick Right Subdural Hematoma, most pronounced along the anterior right frontal convexity. Stable small volume right hemisphere [facility]." Chest CT 5/17: "Emphysema. Right pleural effusion. Hazy upper lobe airspace disease is indeterminate for edema or pneumonia." Neuro change  noted by RN around 1300 on 5/19 with workup revealing seizures, now controlled with medication. Pt with history of CAD, prior stroke, hypertension, hyperlipidemia, type 2 diabetes, chronic antiplatelet use with aspirin and Plavix. MBS revealed hardware from [facility]. Cortrak placed 5/22;ST f/u for dysphagia tx/management. Repeat MBS recommended initiating a Dysphagia 1(puree)/Honey-thickened liquid diet.  ST f/u for dysphagia tx/management/education.      SLP Plan  Continue with current plan of care      Recommendations for follow up therapy are one component of a multi-disciplinary discharge planning process, led by the attending physician.  Recommendations may be updated based on patient status, additional functional criteria and insurance authorization.    Recommendations  Diet recommendations: Honey-thick liquid;Dysphagia 1 (puree) Liquids provided via: Teaspoon Medication Administration: Via alternative means (or crushed/puree if alert) Supervision: Full supervision/cueing for compensatory strategies;Trained caregiver to feed patient Compensations: Slow rate;Small sips/bites;Multiple dry swallows after each bite/sip;Follow solids with liquid;Clear throat intermittently Postural Changes and/or Swallow Maneuvers: Seated upright 90 degrees                  Oral care BID;Staff/trained caregiver to provide oral care   Frequent or constant Supervision/Assistance Dysphagia, oropharyngeal phase (R13.12)     Continue with current plan of care     Pat Roney Youtz,M.S., CCC-SLP  09/13/2022, 11:15 AM

## 2022-09-13 NOTE — Progress Notes (Addendum)
Daily Progress Note   Patient Name: Raymond Sampson       Date: 09/13/2022 DOB: Nov 09, 1940  Age: 82 y.o. MRN#: [id number] Attending Physician: Lurene Shadow, MD Primary Care Physician: Gracelyn Nurse, MD Admit Date: 08/18/2022  Reason for Consultation/Follow-up: Establishing goals of care  Subjective: Patient   Length of Stay: 26  Current Medications: Scheduled Meds:   amantadine  100 mg Oral Daily   amLODipine  10 mg Per Tube Daily   aspirin  81 mg Per Tube Daily   atorvastatin  80 mg Per Tube Daily   carvedilol  12.5 mg Per Tube BID WC   clopidogrel  75 mg Per Tube Daily   enoxaparin (LOVENOX) injection  40 mg Subcutaneous Q24H   feeding supplement (JEVITY 1.5 CAL/FIBER)  1,000 mL Per Tube Q24H   Gerhardt's butt cream   Topical QID   insulin aspart  0-15 Units Subcutaneous Q4H   insulin aspart  3 Units Subcutaneous Q4H   insulin glargine-yfgn  15 Units Subcutaneous Daily   lacosamide  50 mg Per Tube BID   lidocaine  1 patch Transdermal Q24H   multivitamin with minerals  1 tablet Per Tube Daily   mouth rinse  15 mL Mouth Rinse 4 times per day   sertraline  25 mg Per Tube QPM    Continuous Infusions:  sodium chloride 10 mL/hr at 09/11/22 0226    PRN Meds: sodium chloride, acetaminophen (TYLENOL) oral liquid 160 mg/5 mL, [DISCONTINUED] acetaminophen **OR** acetaminophen, clonazepam, guaiFENesin-dextromethorphan, labetalol, [DISCONTINUED] ondansetron **OR** ondansetron (ZOFRAN) IV, mouth rinse  Physical Exam Vitals reviewed.  Constitutional:      General: He is sleeping.     Appearance: He is ill-appearing.  HENT:     Nose:     Comments: cortrak Cardiovascular:     Rate and Rhythm: Normal rate.  Pulmonary:     Effort: Pulmonary effort is normal.  Skin:     General: Skin is warm and dry.  Neurological:     Mental Status: He is easily aroused.  Psychiatric:        Mood and Affect: Mood normal.        Behavior: Behavior normal.             Vital Signs: BP 132/60   Pulse 77   Temp 99.1 F (  37.3 C) (Oral)   Resp 20   Ht 5\' 7"  (1.702 m)   Wt 64.4 kg   SpO2 97%   BMI 22.24 kg/m  SpO2: SpO2: 97 % O2 Device: O2 Device: Room Air O2 Flow Rate: O2 Flow Rate (L/min): 2 L/min  Intake/output summary:  Intake/Output Summary (Last 24 hours) at 09/13/2022 1620 Last data filed at 09/13/2022 1400 Gross per 24 hour  Intake 45 ml  Output 2075 ml  Net -2030 ml   LBM: Last BM Date : 09/13/22 Baseline Weight: Weight: 65.2 kg Most recent weight: Weight: 64.4 kg       Palliative Assessment/Data:      Patient Active Problem List   Diagnosis Date Noted   Facial laceration 09/06/2022   Hyponatremia 09/06/2022   SDH (subdural hematoma) (HCC) 09/06/2022   Seizure (HCC) 09/06/2022   Hypokalemia 09/06/2022   Hypophosphatemia 09/06/2022   Cerebrovascular accident (CVA) (HCC) 09/06/2022   Dysphagia 09/06/2022   CAD S/P percutaneous coronary angioplasty 09/06/2022   Protein-calorie malnutrition, severe 08/24/2022   Provoked seizures (HCC) 08/23/2022   Encephalopathy acute 08/23/2022   DNR (do not resuscitate) 08/23/2022   Traumatic subdural hematoma without loss of consciousness (HCC) 08/18/2022   Fall 06/22/2022   Malnutrition of moderate degree 06/22/2022   Acute blood loss anemia 06/21/2022   Multiple rib fractures involving four or more ribs, right side 06/20/2022   Intramuscular hematoma 06/19/2022   Rib fractures 06/19/2022   Hemothorax on right 06/19/2022   Leukocytosis 06/19/2022   Status post coronary artery stent placement 05/02/2022   Mild dementia (HCC) 04/14/2022   Non-ST elevation (NSTEMI) myocardial infarction (HCC) 04/13/2022   History of COVID-19 04/13/2022   Lung nodule 04/13/2022   PAD (peripheral artery disease)  (HCC) 04/13/2022   Flu 06/05/2017   Memory loss, short term 02/07/2016   History of CVA (cerebrovascular accident) 02/07/2016   Diabetes mellitus type 2, uncomplicated (HCC) 04/06/2015   Carotid stenosis 05/14/2013   CAROTID BRUIT, LEFT 04/25/2010   PALPITATIONS 06/18/2009   Hyperlipidemia 04/27/2009   Essential hypertension 04/27/2009   Ischemic heart disease 04/27/2009    Palliative Care Assessment & Plan   Patient Profile: 82 y.o. male with past medical history of CAD, stroke, DM2, HTN, HLD admitted on 08/18/2022 after a fall at his home with a large right-sided subdural hematoma. He was initially alert and oriented but on 08/21/22 he he became more drowsy and was only able to intermittently follow commands and intermittently answer questions. EEG showed seizure activity but he is now without seizures. SLP is seeing patient for dysphagia. RD assessing nutritional intake.  Assessment: Patient was sleeping. I met with wife at bedside. I told her that although the patient has had improvement we are still worried about his ability to get sufficient nutrition/hydration at discharge. She became tearful. In the past she has gone to SNF for all three meals to help/encourage him. She has been trying to be conscious of what he is eating because of his diabetes and was also concerned about feeding him too much protein. She asked for guidance in determining which foods would best help him. She told me that for example she would usually feed him broccoli first for nutrition but knows the magic cups have many more calories-- so should she feed him this first? I suggested she increasing his calories and protein and told her I would reach out to RD.   I encouraged her to talk with the family about what they would want to do  if the caloric intake is not enough when he is ready to discharge. In the past the family has stated they would not want a PEG tube. The patient's MOST form also states  this.  Recommendations/Plan: DNR Patient has stated in the past he does not want a PEG tube.  Provide education to patient's wife re: best options for increasing his calorie count (calorie count shows patient is meeting around 50% of his nutritional needs by PO diet) Allow time for outcomes Encourage family to continue conversations around GOC     Goals of Care and Additional Recommendations: Limitations on Scope of Treatment: use MOST form for guidance  Code Status:    Code Status Orders  (From admission, onward)           Start     Ordered   08/19/22 0022  Do not attempt resuscitation (DNR)  Continuous       Question Answer Comment  If patient has no pulse and is not breathing Do Not Attempt Resuscitation   If patient has a pulse and/or is breathing: Medical Treatment Goals COMFORT MEASURES: Keep clean/warm/dry, use medication by any route; positioning, wound care and other measures to relieve pain/suffering; use oxygen, suction/manual treatment of airway obstruction for comfort; do not transfer unless for comfort needs.   Consent: Discussion documented in EHR or advanced directives reviewed      08/19/22 0023           Code Status History     Date Active Date Inactive Code Status Order ID Comments User Context   08/18/2022 2105 08/19/2022 0023 Full Code [id number]  Vonna Drafts ED   06/28/2022 1422 07/03/2022 2122 DNR [id number]  Morton Stall, NP Inpatient   06/19/2022 1721 06/28/2022 1422 Full Code [id number]  Floydene Flock, MD ED   05/02/2022 1135 05/03/2022 1523 Full Code [id number]  Yvonne Kendall, MD Inpatient   04/13/2022 0048 04/15/2022 2115 Full Code [id number]  Nolberto Hanlon, MD ED   06/05/2017 2015 06/07/2017 1757 Full Code [id number]  Ihor Austin, MD Inpatient       Prognosis:  Unable to determine  Discharge Planning: To Be Determined  Care plan was discussed with Dr. Myriam Forehand, SLP, and RD.  Thank you for allowing the Palliative Medicine Team to  assist in the care of this patient.  Time spent: 55 minutes   Detailed review of medical records ( labs, imaging, vital signs), medically appropriate exam, counseling and education to care partner, documenting clinical information, medication management, coordination of care.    Sherryll Burger, NP  Please contact Palliative Medicine Team phone at [phone] for questions and concerns.

## 2022-09-13 NOTE — Progress Notes (Signed)
Calorie Count Note  48-hour calorie count ordered.  Reviewed tickets over the last 24 hours. Pt has stable intake from day 1, but still only meeting ~50% of needs via PO diet. Still feel that pt will not be able to meet his full nutrition needs at this time.    Relayed information to MD and PMT.  Diet: DYS1, honey thick Supplements: Magic Cup BID with meals  Estimated Nutritional Needs:  Kcal:  1600-1800 kcal/d Protein:  80-100g/d Fluid:  >1.7 L/day  6/11 Breakfast: 268kcal, 11g of protein 6/11 Lunch: 0kcal, 0g protein 6/11 Dinner: 544 kcal, 28g of protein  Total intake 6/11: 812 kcal (51% of minimum estimated needs)  39 protein (49% of minimum estimated needs)  6/12 Breakfast: 441kcal, 27g protein  NUTRITION DIAGNOSIS:  Severe Malnutrition related to chronic illness (fall) as evidenced by severe fat depletion, severe muscle depletion. - remains applicable   GOAL:  Patient will meet greater than or equal to 90% of their needs - progressing, being met with TF at goal  INTERVENTION:  Continue diet per SLP recommendation Feeding assistance Continue nocturnal TF: Jevity 1.5 at 75 ml/hr x 12 hours (900 ml per day) Provides 1350 kcal, 57 gm protein, 684 ml free water daily MVI with minerals daily   Greig Castilla, RD, LDN Clinical Dietitian RD pager # available in [facility]  After hours/weekend pager # available in [facility]

## 2022-09-13 NOTE — Progress Notes (Signed)
Occupational Therapy Treatment Patient Details Name: Raymond Sampson MRN: [id number] DOB: Mar 31, 1941 Today's Date: 09/13/2022   History of present illness 82 year old male admitted 08/18/22 after fall at home. CT head 5/18 revealed: large Right Subdural Hematoma, most pronounced along the anterior right frontal convexity.  Developed increased lethargy and change in pupilary size on 5/16 and MRI sowed multiple punctate areas of restricted diffusion in bilateral frontal and parietal cortex suspicious for tiny acute infarcts. LTM EEG positive for nonconvulsive seizures. Pt with history of CAD, prior stroke, hypertension, hyperlipidemia, type 2 diabetes, prior fall in March this year with SNF stay, chronic antiplatelet use with aspirin and Plavix.   OT comments  Patient able to get to EOB with max assist for one and min assist for sitting balance while performing reaching and grooming tasks. Standing attempted with face to face technique x3 to move towards [facility] with max assist and patient demonstrating fear for falling. UE ROM and strengthening performed in supine. Acute OT to continue to follow.  Patient will benefit from continued inpatient follow up therapy, <3 hours/day.    Recommendations for follow up therapy are one component of a multi-disciplinary discharge planning process, led by the attending physician.  Recommendations may be updated based on patient status, additional functional criteria and insurance authorization.    Assistance Recommended at Discharge Frequent or constant Supervision/Assistance  Patient can return home with the following  Two people to help with walking and/or transfers;Two people to help with bathing/dressing/bathroom;Help with stairs or ramp for entrance;Assist for transportation;Direct supervision/assist for financial management;Assistance with feeding;Direct supervision/assist for medications management;Assistance with cooking/housework   Equipment Recommendations   Other (comment) (defer)    Recommendations for Other Services      Precautions / Restrictions Precautions Precautions: Fall Precaution Comments: Seizures; cortrak; buttocks wounds Restrictions Weight Bearing Restrictions: No       Mobility Bed Mobility Overal bed mobility: Needs Assistance Bed Mobility: Supine to Sit, Sit to Supine     Supine to sit: Max assist, HOB elevated Sit to supine: Max assist, HOB elevated   General bed mobility comments: assistance with trunk and BLEs    Transfers Overall transfer level: Needs assistance Equipment used: None Transfers: Sit to/from Stand Sit to Stand: Max assist           General transfer comment: sit to stands performed from EOB to get closer to [facility]     Balance Overall balance assessment: Needs assistance Sitting-balance support: Single extremity supported, Feet supported, Bilateral upper extremity supported Sitting balance-Leahy Scale: Poor Sitting balance - Comments: Patient reliant on at least one extremity support. performed reaching and grooming seated on EOB with min assist for balance   Standing balance support: Bilateral upper extremity supported Standing balance-Leahy Scale: Zero Standing balance comment: face to face technique to stand from EOB to move towards [facility]                           ADL either performed or assessed with clinical judgement   ADL Overall ADL's : Needs assistance/impaired     Grooming: Wash/dry face;Min guard;Minimal assistance;Sitting Grooming Details (indicate cue type and reason): on EOB                                    Extremity/Trunk Assessment              Vision  Perception     Praxis      Cognition Arousal/Alertness: Awake/alert Behavior During Therapy: Anxious Overall Cognitive Status: Impaired/Different from baseline Area of Impairment: Attention, Memory, Following commands, Safety/judgement, Problem solving, Awareness                    Current Attention Level: Focused Memory: Decreased short-term memory Following Commands: Follows one step commands inconsistently, Follows one step commands with increased time Safety/Judgement: Decreased awareness of safety, Decreased awareness of deficits Awareness: Intellectual Problem Solving: Slow processing, Decreased initiation, Difficulty sequencing, Requires verbal cues, Requires tactile cues General Comments: alert and following directions with increased time. Appeared fearful of falling when attempting to stand from EOB        Exercises Exercises: General Upper Extremity General Exercises - Upper Extremity Shoulder Flexion: AROM, Both, 10 reps, Supine Elbow Flexion: Strengthening, Both, 10 reps, Supine, Theraband Theraband Level (Elbow Flexion): Level 2 (Red) Elbow Extension: Strengthening, Both, 10 reps, Supine, Theraband Theraband Level (Elbow Extension): Level 2 (Red)    Shoulder Instructions       General Comments      Pertinent Vitals/ Pain       Pain Assessment Pain Assessment: Faces Faces Pain Scale: Hurts a little bit Pain Location: generalized while on EOB Pain Descriptors / Indicators: Discomfort, Grimacing Pain Intervention(s): Limited activity within patient's tolerance, Monitored during session, Repositioned  Home Living                                          Prior Functioning/Environment              Frequency  Min 2X/week        Progress Toward Goals  OT Goals(current goals can now be found in the care plan section)  Progress towards OT goals: Progressing toward goals  Acute Rehab OT Goals Patient Stated Goal: none stated OT Goal Formulation: With patient Time For Goal Achievement: 09/19/22 Potential to Achieve Goals: Fair ADL Goals Pt Will Perform Grooming: with mod assist;bed level Additional ADL Goal #1: Pt will follow 1 step commands with 50% accuracy given increased time. Additional  ADL Goal #2: Pt will complete bed mobility with max assist as precursor to ADLs.  Plan Discharge plan remains appropriate;Frequency remains appropriate    Co-evaluation                 AM-PAC OT "6 Clicks" Daily Activity     Outcome Measure   Help from another person eating meals?: Total Help from another person taking care of personal grooming?: A Lot Help from another person toileting, which includes using toliet, bedpan, or urinal?: Total Help from another person bathing (including washing, rinsing, drying)?: Total Help from another person to put on and taking off regular upper body clothing?: A Lot Help from another person to put on and taking off regular lower body clothing?: Total 6 Click Score: 8    End of Session Equipment Utilized During Treatment: Gait belt  OT Visit Diagnosis: Other abnormalities of gait and mobility (R26.89);Muscle weakness (generalized) (M62.81);Other symptoms and signs involving cognitive function;Other symptoms and signs involving the nervous system (R29.898)   Activity Tolerance Patient tolerated treatment well   Patient Left in bed;with call bell/phone within reach;with bed alarm set;with family/visitor present   Nurse Communication Mobility status        Time: 4098-1191 OT Time Calculation (min): 25 min  Charges: OT General Charges $OT Visit: 1 Visit OT Treatments $Self Care/Home Management : 8-22 mins $Therapeutic Activity: 8-22 mins  Alfonse Flavors, OTA Acute Rehabilitation Services  Office [phone]   Dewain Penning 09/13/2022, 2:58 PM

## 2022-09-13 NOTE — Progress Notes (Addendum)
Progress Note    Raymond Sampson  JYN:[id number] DOB: 1941/03/03  DOA: 08/18/2022 PCP: Gracelyn Nurse, MD      Brief Narrative:    Medical records reviewed and are as summarized below:  Raymond Sampson is a 82 y.o. male with PMH of CAD, CVA, HTN, HLD, DM-2, cervical spine fusion and OA presented to ED after fall at home and found to have traumatic right frontal SDH measuring 2.2 cm in maximum thickness with 5 to 6 mm midline shift to the left, and trace SAH at the right temporal lobe.  Patient was on Plavix and aspirin.  CT cervical spine without acute finding but some sclerotic changes at C2-C3 facet joint.  He was admitted to ICU by neurosurgery. He was initially minimally symptomatic and refused surgery.  However, he developed intermittent focal seizure.    Repeat CT head on 5/18 with stable SDH and SAH.  MRI brain showed multiple small/punctate areas of restricted diffusion with bilateral frontal and parietal cortex suspicious for tiny acute infarcts or small intraparenchymal contusion and/or shear type injury in the setting of trauma.  EEG on 5/21 showed intermittent seizure.  Neurology consulted.  Patient was started on Keppra and Vimpat.  Palliative medicine consulted as well.   Patient was transferred to hospitalist service on 5/23.  Subsequent LTM EEG without seizure.  Neurology signed off.  Encephalopathy improved but seems to be delirious.  Remains NPO except some pured diet from nursing stock.  He is on tube feed via cortrack.  SLP following.  Therapy recommended SNF. Patient remains very sleepy and lethargic, SLP unable to assess him due to sleepiness. Repeat CT head without contrast showed unchanged subdural hematoma.          Assessment/Plan:   Principal Problem:   Traumatic subdural hematoma without loss of consciousness (HCC) Active Problems:   Provoked seizures (HCC)   Encephalopathy acute   DNR (do not resuscitate)   Protein-calorie malnutrition,  severe   Facial laceration   Hyponatremia   SDH (subdural hematoma) (HCC)   Seizure (HCC)   Hypokalemia   Hypophosphatemia   Cerebrovascular accident (CVA) (HCC)   Dysphagia   CAD S/P percutaneous coronary angioplasty   Nutrition Problem: Severe Malnutrition Etiology: chronic illness (fall)  Signs/Symptoms: severe fat depletion, severe muscle depletion   Body mass index is 22.24 kg/m.    1 traumatic right subdural hematoma/subarachnoid hemorrhage with midline shift secondary to fall and antiplatelet therapy -Patient presented with a fall found to have a traumatic right subdural hematoma/subarachnoid hemorrhage with midline shift. -Repeat head CT done showed a stable hematoma. -Patient assessed by neurosurgery who recommended no surgical intervention at that time. -Recommendations were to hold aspirin and Plavix for a week and subsequently these have been resumed. -PT/OT recommended SNF. -Patient still with cortrack has been assessed by SLP and patient started on a diet.  -The evening of (09/06/2022) due to concerns for change in pupil size, repeat head CT was done with no significant change. -Neurosurgery recommended outpatient follow-up.   2.  Fall at home, left frontal laceration -Status post repair with stitches. -Sutures removed 08/27/2022   3.  Provoked seizures -Felt secondary to right subdural hematoma and subarachnoid hemorrhage with midline shift. -EEG done on 09/01/2022 with seizures. -Patient had subsequent LTM EEG with evidence of epileptogenicity arising from the right central parietal region. -Patient was started on AEDs. -Patient noted to have increased lethargy, neurology reconsulted he was on LTM EEG which did not  show any seizure activity. -Neurology initially decreased Keppra dose and finally discontinued it to minimize sedation. -Neurology recommended continuation of current dose of Vimpat at 50 twice daily and if continued lethargy may need  amantadine. -Amantadine started 09/09/2022. -Per neurology patient at risk for more seizures.    4.  Acute metabolic encephalopathy -Likely secondary to subdural hematoma, provoked seizures in the setting of memory deficits. -Avoid sedating medications or minimize sedating medications. -Patient with some improvement with discontinuation of Keppra and decreased dose of Vimpat. -Patient noted to be intermittently awake and eating some food from floor stock component early on in the hospitalization. -TSH done within normal limits, ammonia within normal limits. -Repeat head CT done showed unchanged subdural hematoma. -Urinalysis unremarkable. -Chest x-ray negative for any infiltrate. -Due to increased lethargy earlier on during the hospitalization neurology reconsulted, patient was on LTM EEG which did not show any seizure activity and as such neurology decreased dose of Keppra initially and subsequently discontinued it to minimize sedation.  Vimpat dose also decreased to 50 mg twice daily. -With decreased AEDs, likely at risk for more seizures. -Due to ongoing lethargy neurology had recommended starting patient on amantadine. -Family noted had been initially hesitant about starting amantadine however due to patient's lethargy the morning of 09/09/2022 and drowsiness wife decided to go ahead and patient subsequently started on amantadine 09/09/2022.   -Per wife patient more alert and more interactive after initiation of amantadine. -Neurology was following but have signed off as of 09/08/2022.   5.  Back pain -Likely secondary to fall. -X-rays are negative for any acute findings. -Lidoderm patch, pain control, PT/OT.   6.  Acute blood loss anemia -Patient with no overt bleeding. -Status post transfusion 2 units PRBCs. -Hemoglobin currently stable at 9.8.   7.  CAD status post PCI/DES stent to LCx/OM 2 in January 2024. -Coreg, Lipitor, Plavix, aspirin.    8.  Well-controlled  non-insulin-dependent diabetes mellitus -Hemoglobin A1c 6.0 (08/21/2022) -Continue Semglee 15 units daily, NovoLog 3 units every 4 hours, SSI.   9.  Hypertension -Coreg, Norvasc.        10.  Hypokalemia/hypophosphatemia -Repleted.     11.  History of CVA -Continue Plavix, aspirin, Lipitor for secondary stroke prophylaxis.    12.  Hyponatremia Resolved.   13.  Dysphagia -Patient on honey thickened pure per nursing. -Assessed by SLP who recommended tube feeds for nutritional supplementation. -Patient noted not to be alert enough to pass it early on in the hospitalization.  With SLP evaluation, improved mental status and alertness after discontinuation of Keppra and decreasing Vimpat. -SLP following and patient placed on a dysphagia 1 diet with honey thick liquids.. -Family have decided no PEG tube placement at this time. -Palliative care following.   14.  Coughing and congestion -Chest x-ray no acute infiltrate. -Robitussin as needed. -Wife concerned about congestion and possible aspiration and requested further evaluation.  However, repeat chest x-ray done on 09/10/2022 did not show any acute infiltrate.    15.  Mood disorder -Zoloft.     16.  Severe protein calorie malnutrition -Currently on tube feeds via cortrack. -Family noted to have decided earlier on the hospitalization for no PEG tube placement. -Patient close significantly drowsy early on in the hospitalization and felt likely playing a role in patient's oral intake and nutrition.  -Continue nutritional supplementation. -Tube feeds changed to nocturnal feeds. -Patient seen by SLP and patient placed on a dysphagia 1 diet with honey thick liquids. -Dietitian following, calorie count underway.  Diet Order             DIET - DYS 1 Room service appropriate? No; Fluid consistency: Honey Thick  Diet effective now                            Consultants: Neurosurgery: Dr. Maurice Small  08/18/2022 Neurology: Dr. Selina Cooley 08/21/2022 Palliative care: Dr. Patterson Hammersmith 08/22/2022  Procedures: CT head CT C-spine 08/18/2022 CT maxillofacial 08/18/2022 CT head 08/19/2022, 08/20/2022, 09/01/2022 CT angiogram head and neck 08/21/2022 Chest x-ray 09/03/2022, 09/10/2022 plain films of the left hip and pelvis 08/24/2022 MRI brain 08/20/2022 2D echo 08/22/2022 LTM EEG  Modified barium swallow 08/21/2022 Transfusion 2 units PRBCs 08/25/2022 CT head 09/07/2022    Medications:    amantadine  100 mg Oral Daily   amLODipine  10 mg Per Tube Daily   aspirin  81 mg Per Tube Daily   atorvastatin  80 mg Per Tube Daily   carvedilol  12.5 mg Per Tube BID WC   clopidogrel  75 mg Per Tube Daily   enoxaparin (LOVENOX) injection  40 mg Subcutaneous Q24H   feeding supplement (JEVITY 1.5 CAL/FIBER)  1,000 mL Per Tube Q24H   Gerhardt's butt cream   Topical QID   insulin aspart  0-15 Units Subcutaneous Q4H   insulin aspart  3 Units Subcutaneous Q4H   insulin glargine-yfgn  15 Units Subcutaneous Daily   lacosamide  50 mg Per Tube BID   lidocaine  1 patch Transdermal Q24H   multivitamin with minerals  1 tablet Per Tube Daily   mouth rinse  15 mL Mouth Rinse 4 times per day   sertraline  25 mg Per Tube QPM   Continuous Infusions:  sodium chloride 10 mL/hr at 09/11/22 0226     Anti-infectives (From admission, onward)    None              Family Communication/Anticipated D/C date and plan/Code Status   DVT prophylaxis: enoxaparin (LOVENOX) injection 40 mg Start: 08/28/22 1000 Place and maintain sequential compression device Start: 08/25/22 0839 SCDs Start: 08/18/22 2106     Code Status: DNR  Family Communication: Plan discussed with his wife at the bedside Disposition Plan: Plan to discharge to SNF   Status is: Inpatient Remains inpatient appropriate because: Awaiting placement to SNF       Subjective:   Interval events noted.  Patient is sleepy and unable to provide any history at  this time.  His wife was at the bedside and she provided most of the history.  She said that patient mental status has improved and he is starting to eat some since enteral nutrition was reduced to nocturnal feeding only.  Objective:    Vitals:   09/13/22 0000 09/13/22 0430 09/13/22 0810 09/13/22 1145  BP: (!) 150/70 128/75 (!) 151/84 107/66  Pulse: 82 78 77 77  Resp: 17 16 20 16   Temp: 98.5 F (36.9 C) 98 F (36.7 C) 98 F (36.7 C) 98.6 F (37 C)  TempSrc: Oral Oral Oral Oral  SpO2: 98% 95% 97% 97%  Weight:      Height:       No data found.   Intake/Output Summary (Last 24 hours) at 09/13/2022 1531 Last data filed at 09/13/2022 1400 Gross per 24 hour  Intake 45 ml  Output 2075 ml  Net -2030 ml   Filed Weights   09/09/22 0500 09/10/22 0745 09/12/22 0454  Weight: 66.7 kg 65.4 kg  64.4 kg    Exam:  GEN: NAD SKIN: Warm and dry EYES: No pallor or icterus ENT: MMM, NG tube in place CV: RRR PULM: CTA B ABD: soft, ND, NT, +BS CNS: Drowsy but arousable, difficult to evaluate because he was mostly sleepy EXT: No edema or tenderness        Data Reviewed:   I have personally reviewed following labs and imaging studies:  Labs: Labs show the following:   Basic Metabolic Panel: Recent Labs  Lab 09/07/22 0851 09/08/22 1318 09/09/22 0323 09/11/22 0710 09/12/22 1137  NA 134* 134* 136 131* 132*  K 4.0 3.9 4.0 3.8 4.3  CL 103 101 103 98 99  CO2 23 26 26 25 25   GLUCOSE 176* 143* 106* 111* 138*  BUN 47* 39* 35* 27* 30*  CREATININE 0.67 0.73 0.70 0.76 0.79  CALCIUM 8.5* 8.5* 8.4* 8.3* 8.5*  MG 1.9  --  2.0  --  2.1  PHOS 2.9  --  3.1  --   --    GFR Estimated Creatinine Clearance: 64.8 mL/min (by C-G formula based on SCr of 0.79 mg/dL). Liver Function Tests: Recent Labs  Lab 09/07/22 0851 09/09/22 0323  ALBUMIN 2.4* 2.2*   No results for input(s): "LIPASE", "AMYLASE" in the last 168 hours. No results for input(s): "AMMONIA" in the last 168  hours. Coagulation profile No results for input(s): "INR", "PROTIME" in the last 168 hours.  CBC: Recent Labs  Lab 09/07/22 0851 09/08/22 1318 09/09/22 0323 09/12/22 1137  WBC 10.4 9.9 8.7 9.3  HGB 10.2* 9.8* 9.5* 9.8*  HCT 31.8* 30.6* 30.0* 30.5*  MCV 94.4 92.7 92.9 91.3  PLT 431* 421* 393 405*   Cardiac Enzymes: No results for input(s): "CKTOTAL", "CKMB", "CKMBINDEX", "TROPONINI" in the last 168 hours. BNP (last 3 results) No results for input(s): "PROBNP" in the last 8760 hours. CBG: Recent Labs  Lab 09/12/22 2034 09/13/22 0100 09/13/22 0444 09/13/22 0914 09/13/22 1205  GLUCAP 189* 145* 120* 158* 110*   D-Dimer: No results for input(s): "DDIMER" in the last 72 hours. Hgb A1c: No results for input(s): "HGBA1C" in the last 72 hours. Lipid Profile: No results for input(s): "CHOL", "HDL", "LDLCALC", "TRIG", "CHOLHDL", "LDLDIRECT" in the last 72 hours. Thyroid function studies: No results for input(s): "TSH", "T4TOTAL", "T3FREE", "THYROIDAB" in the last 72 hours.  Invalid input(s): "FREET3" Anemia work up: No results for input(s): "VITAMINB12", "FOLATE", "FERRITIN", "TIBC", "IRON", "RETICCTPCT" in the last 72 hours. Sepsis Labs: Recent Labs  Lab 09/07/22 0851 09/08/22 1318 09/09/22 0323 09/12/22 1137  WBC 10.4 9.9 8.7 9.3    Microbiology No results found for this or any previous visit (from the past 240 hour(s)).  Procedures and diagnostic studies:  No results found.             LOS: 26 days   Raymond Sampson  [employer] Chartered loss adjuster on [email or web address]. If 7PM-7AM, please contact night-coverage at [email or web address]     09/13/2022, 3:31 PM

## 2022-09-14 DIAGNOSIS — S065X0D Traumatic subdural hemorrhage without loss of consciousness, subsequent encounter: Secondary | ICD-10-CM | POA: Diagnosis not present

## 2022-09-14 LAB — GLUCOSE, CAPILLARY
Glucose-Capillary: 106 mg/dL — ABNORMAL HIGH (ref 70–99)
Glucose-Capillary: 131 mg/dL — ABNORMAL HIGH (ref 70–99)
Glucose-Capillary: 133 mg/dL — ABNORMAL HIGH (ref 70–99)
Glucose-Capillary: 143 mg/dL — ABNORMAL HIGH (ref 70–99)
Glucose-Capillary: 150 mg/dL — ABNORMAL HIGH (ref 70–99)
Glucose-Capillary: 165 mg/dL — ABNORMAL HIGH (ref 70–99)
Glucose-Capillary: 172 mg/dL — ABNORMAL HIGH (ref 70–99)

## 2022-09-14 MED ORDER — JEVITY 1.5 CAL/FIBER PO LIQD
1000.0000 mL | ORAL | Status: DC
  Administered 2022-09-14: 1000 mL
  Filled 2022-09-14 (×2): qty 1000

## 2022-09-14 NOTE — Progress Notes (Signed)
Daily Progress Note   Patient Name: Raymond Sampson       Date: 09/14/2022 DOB: 12/11/1940  Age: 82 y.o. MRN#: [id number] Attending Physician: Lurene Shadow, MD Primary Care Physician: Gracelyn Nurse, MD Admit Date: 08/18/2022  Reason for Consultation/Follow-up: Establishing goals of care  Subjective: Patient was asleep in NAD. Wife was at bedside.  Length of Stay: 27  Current Medications: Scheduled Meds:   amantadine  100 mg Oral Daily   amLODipine  10 mg Per Tube Daily   aspirin  81 mg Per Tube Daily   atorvastatin  80 mg Per Tube Daily   carvedilol  12.5 mg Per Tube BID WC   clopidogrel  75 mg Per Tube Daily   enoxaparin (LOVENOX) injection  40 mg Subcutaneous Q24H   feeding supplement (JEVITY 1.5 CAL/FIBER)  1,000 mL Per Tube Q24H   Gerhardt's butt cream   Topical QID   insulin aspart  0-15 Units Subcutaneous Q4H   insulin aspart  3 Units Subcutaneous Q4H   insulin glargine-yfgn  15 Units Subcutaneous Daily   lacosamide  50 mg Per Tube BID   lidocaine  1 patch Transdermal Q24H   multivitamin with minerals  1 tablet Per Tube Daily   mouth rinse  15 mL Mouth Rinse 4 times per day   sertraline  25 mg Per Tube QPM    Continuous Infusions:  sodium chloride 10 mL/hr at 09/11/22 0226    PRN Meds: sodium chloride, acetaminophen (TYLENOL) oral liquid 160 mg/5 mL, [DISCONTINUED] acetaminophen **OR** acetaminophen, clonazepam, guaiFENesin-dextromethorphan, labetalol, [DISCONTINUED] ondansetron **OR** ondansetron (ZOFRAN) IV, mouth rinse  Physical Exam Vitals reviewed.  Constitutional:      General: He is sleeping.     Appearance: He is ill-appearing.  Pulmonary:     Effort: Pulmonary effort is normal.  Skin:    General: Skin is warm and dry.  Neurological:     Mental  Status: He is easily aroused.             Vital Signs: BP 126/79 (BP Location: Right Arm)   Pulse 77   Temp 97.7 F (36.5 C) (Oral)   Resp 17   Ht 5\' 7"  (1.702 m)   Wt 64.4 kg   SpO2 97%   BMI 22.24 kg/m  SpO2: SpO2: 97 % O2 Device: O2  Device: Room Air O2 Flow Rate: O2 Flow Rate (L/min): 2 L/min  Intake/output summary:  Intake/Output Summary (Last 24 hours) at 09/14/2022 1534 Last data filed at 09/14/2022 1208 Gross per 24 hour  Intake 257 ml  Output 2000 ml  Net -1743 ml   LBM: Last BM Date : 09/13/22 Baseline Weight: Weight: 65.2 kg Most recent weight: Weight: 64.4 kg       Palliative Assessment/Data: 30%      Patient Active Problem List   Diagnosis Date Noted   Facial laceration 09/06/2022   Hyponatremia 09/06/2022   SDH (subdural hematoma) (HCC) 09/06/2022   Seizure (HCC) 09/06/2022   Hypokalemia 09/06/2022   Hypophosphatemia 09/06/2022   Cerebrovascular accident (CVA) (HCC) 09/06/2022   Dysphagia 09/06/2022   CAD S/P percutaneous coronary angioplasty 09/06/2022   Protein-calorie malnutrition, severe 08/24/2022   Provoked seizures (HCC) 08/23/2022   Encephalopathy acute 08/23/2022   DNR (do not resuscitate) 08/23/2022   Traumatic subdural hematoma without loss of consciousness (HCC) 08/18/2022   Fall 06/22/2022   Malnutrition of moderate degree 06/22/2022   Acute blood loss anemia 06/21/2022   Multiple rib fractures involving four or more ribs, right side 06/20/2022   Intramuscular hematoma 06/19/2022   Rib fractures 06/19/2022   Hemothorax on right 06/19/2022   Leukocytosis 06/19/2022   Status post coronary artery stent placement 05/02/2022   Mild dementia (HCC) 04/14/2022   Non-ST elevation (NSTEMI) myocardial infarction (HCC) 04/13/2022   History of COVID-19 04/13/2022   Lung nodule 04/13/2022   PAD (peripheral artery disease) (HCC) 04/13/2022   Flu 06/05/2017   Memory loss, short term 02/07/2016   History of CVA (cerebrovascular accident)  02/07/2016   Diabetes mellitus type 2, uncomplicated (HCC) 04/06/2015   Carotid stenosis 05/14/2013   CAROTID BRUIT, LEFT 04/25/2010   PALPITATIONS 06/18/2009   Hyperlipidemia 04/27/2009   Essential hypertension 04/27/2009   Ischemic heart disease 04/27/2009    Palliative Care Assessment & Plan   Patient Profile: 82 y.o. male with past medical history of CAD, stroke, DM2, HTN, HLD admitted on 08/18/2022 after a fall at his home with a large right-sided subdural hematoma. He was initially alert and oriented but on 08/21/22 he he became more drowsy and was only able to intermittently follow commands and intermittently answer questions. EEG showed seizure activity but he is now without seizures. SLP is seeing patient for dysphagia. RD assessing nutritional intake.   Assessment: Patient was sleeping. I met with wife at bedside. He ate most of his breakfast. The patient's wife is appreciative of education provided by RD. She plans to maximize his oral intake by selecting high protein and high calorie foods. She states that the patient understands the importance of increasing his intake in the hopes of discharging to SNF.  Encouraged patient's wife to call PMT with additional questions or concerns.  Recommendations/Plan: DNR Patient has stated in the past he does not want a PEG tube.  Allow time for outcomes Continued PMT support. Encourage family to continue conversations around [facility]   Goals of Care and Additional Recommendations: Limitations on Scope of Treatment: Use MOST form for guidance  Code Status:    Code Status Orders  (From admission, onward)           Start     Ordered   08/19/22 0022  Do not attempt resuscitation (DNR)  Continuous       Question Answer Comment  If patient has no pulse and is not breathing Do Not Attempt Resuscitation   If  patient has a pulse and/or is breathing: Medical Treatment Goals COMFORT MEASURES: Keep clean/warm/dry, use medication by any route;  positioning, wound care and other measures to relieve pain/suffering; use oxygen, suction/manual treatment of airway obstruction for comfort; do not transfer unless for comfort needs.   Consent: Discussion documented in EHR or advanced directives reviewed      08/19/22 0023             Care plan was discussed with Dr. Myriam Forehand, bedside RN, and RD  Thank you for allowing the Palliative Medicine Team to assist in the care of this patient.  Time spent: 40 minutes   Detailed review of medical records ( labs, imaging, vital signs), medically appropriate exam, counseling and education to care partner, documenting clinical information, medication management, coordination of care.   Sherryll Burger, NP  Please contact Palliative Medicine Team phone at [phone] for questions and concerns.

## 2022-09-14 NOTE — Progress Notes (Signed)
Progress Note    Raymond Sampson  ZOX:[id number] DOB: 01-02-1941  DOA: 08/18/2022 PCP: Gracelyn Nurse, MD      Brief Narrative:    Medical records reviewed and are as summarized below:  Raymond Sampson is a 82 y.o. male with PMH of CAD, CVA, HTN, HLD, DM-2, cervical spine fusion and OA presented to ED after fall at home and found to have traumatic right frontal SDH measuring 2.2 cm in maximum thickness with 5 to 6 mm midline shift to the left, and trace SAH at the right temporal lobe.  Patient was on Plavix and aspirin.  CT cervical spine without acute finding but some sclerotic changes at C2-C3 facet joint.  He was admitted to ICU by neurosurgery. He was initially minimally symptomatic and refused surgery.  However, he developed intermittent focal seizure.    Repeat CT head on 5/18 with stable SDH and SAH.  MRI brain showed multiple small/punctate areas of restricted diffusion with bilateral frontal and parietal cortex suspicious for tiny acute infarcts or small intraparenchymal contusion and/or shear type injury in the setting of trauma.  EEG on 5/21 showed intermittent seizure.  Neurology consulted.  Patient was started on Keppra and Vimpat.  Palliative medicine consulted as well.   Patient was transferred to hospitalist service on 5/23.  Subsequent LTM EEG without seizure.  Neurology signed off.  Encephalopathy improved but seems to be delirious.  Remains NPO except some pured diet from nursing stock.  He is on tube feed via cortrack.  SLP following.  Therapy recommended SNF. Patient remains very sleepy and lethargic, SLP unable to assess him due to sleepiness. Repeat CT head without contrast showed unchanged subdural hematoma.          Assessment/Plan:   Principal Problem:   Traumatic subdural hematoma without loss of consciousness (HCC) Active Problems:   Provoked seizures (HCC)   Encephalopathy acute   DNR (do not resuscitate)   Protein-calorie malnutrition,  severe   Facial laceration   Hyponatremia   SDH (subdural hematoma) (HCC)   Seizure (HCC)   Hypokalemia   Hypophosphatemia   Cerebrovascular accident (CVA) (HCC)   Dysphagia   CAD S/P percutaneous coronary angioplasty   Nutrition Problem: Severe Malnutrition Etiology: chronic illness (fall)  Signs/Symptoms: severe fat depletion, severe muscle depletion   Body mass index is 22.24 kg/m.    1 traumatic right subdural hematoma/subarachnoid hemorrhage with midline shift secondary to fall and antiplatelet therapy -Patient presented with a fall found to have a traumatic right subdural hematoma/subarachnoid hemorrhage with midline shift. -Repeat head CT done showed a stable hematoma. -Patient assessed by neurosurgery who recommended no surgical intervention at that time. -Recommendations were to hold aspirin and Plavix for a week and subsequently these have been resumed. -PT/OT recommended SNF. -Patient still with cortrack has been assessed by SLP and patient started on a diet.  -The evening of (09/06/2022) due to concerns for change in pupil size, repeat head CT was done with no significant change. -Neurosurgery recommended outpatient follow-up.   2.  Fall at home, left frontal laceration -Status post repair with stitches. -Sutures removed 08/27/2022   3.  Provoked seizures -Felt secondary to right subdural hematoma and subarachnoid hemorrhage with midline shift. -EEG done on 09/01/2022 with seizures. -Patient had subsequent LTM EEG with evidence of epileptogenicity arising from the right central parietal region. -Patient was started on AEDs. -Patient noted to have increased lethargy, neurology reconsulted he was on LTM EEG which did not  show any seizure activity. -Neurology initially decreased Keppra dose and finally discontinued it to minimize sedation. -Neurology recommended continuation of current dose of Vimpat at 50 twice daily and if continued lethargy may need  amantadine. -Amantadine started 09/09/2022. -Per neurology patient at risk for more seizures.    4.  Acute metabolic encephalopathy -Likely secondary to subdural hematoma, provoked seizures in the setting of memory deficits. -Avoid sedating medications or minimize sedating medications. -Patient with some improvement with discontinuation of Keppra and decreased dose of Vimpat. -Patient noted to be intermittently awake and eating some food from floor stock component early on in the hospitalization. -TSH done within normal limits, ammonia within normal limits. -Repeat head CT done showed unchanged subdural hematoma. -Urinalysis unremarkable. -Chest x-ray negative for any infiltrate. -Due to increased lethargy earlier on during the hospitalization neurology reconsulted, patient was on LTM EEG which did not show any seizure activity and as such neurology decreased dose of Keppra initially and subsequently discontinued it to minimize sedation.  Vimpat dose also decreased to 50 mg twice daily. -With decreased AEDs, likely at risk for more seizures. -Due to ongoing lethargy neurology had recommended starting patient on amantadine. -Family noted had been initially hesitant about starting amantadine however due to patient's lethargy the morning of 09/09/2022 and drowsiness wife decided to go ahead and patient subsequently started on amantadine 09/09/2022.   -Per wife patient more alert and more interactive after initiation of amantadine. -Neurology was following but have signed off as of 09/08/2022.   5.  Back pain -Likely secondary to fall. -X-rays are negative for any acute findings. -Lidoderm patch, pain control, PT/OT.   6.  Acute blood loss anemia -Patient with no overt bleeding. -Status post transfusion 2 units PRBCs. -Hemoglobin currently stable at 9.8.   7.  CAD status post PCI/DES stent to LCx/OM 2 in January 2024. -Coreg, Lipitor, Plavix, aspirin.    8.  Well-controlled  non-insulin-dependent diabetes mellitus -Hemoglobin A1c 6.0 (08/21/2022) -Continue Semglee 15 units daily, NovoLog 3 units every 4 hours, SSI.   9.  Hypertension -Coreg, Norvasc.        10.  Hypokalemia/hypophosphatemia -Repleted.     11.  History of CVA -Continue Plavix, aspirin, Lipitor for secondary stroke prophylaxis.    12.  Hyponatremia Resolved.   13.  Dysphagia -Patient on honey thickened pure per nursing. -Assessed by SLP who recommended tube feeds for nutritional supplementation. -Patient noted not to be alert enough to pass it early on in the hospitalization.  With SLP evaluation, improved mental status and alertness after discontinuation of Keppra and decreasing Vimpat. -SLP following and patient placed on a dysphagia 1 diet with honey thick liquids.. Patient and his wife do not want PEG tube but okay with temporary nasogastric tube.   14.  Coughing and congestion -Chest x-ray no acute infiltrate. -Robitussin as needed. -Wife concerned about congestion and possible aspiration and requested further evaluation.  However, repeat chest x-ray done on 09/10/2022 did not show any acute infiltrate.    15.  Mood disorder -Zoloft.     16.  Severe protein calorie malnutrition -Currently on tube feeds via cortrack. -Continue nutritional supplementation. -Tube feeds changed to nocturnal feeds. -Patient seen by SLP and patient placed on a dysphagia 1 diet with honey thick liquids. -Dietitian following, calorie count underway.              Diet Order             DIET - DYS 1 Room service appropriate?  No; Fluid consistency: Honey Thick  Diet effective now                            Consultants: Neurosurgery: Dr. Maurice Small 08/18/2022 Neurology: Dr. Selina Cooley 08/21/2022 Palliative care: Dr. Patterson Hammersmith 08/22/2022  Procedures: CT head CT C-spine 08/18/2022 CT maxillofacial 08/18/2022 CT head 08/19/2022, 08/20/2022, 09/01/2022 CT angiogram head and neck  08/21/2022 Chest x-ray 09/03/2022, 09/10/2022 plain films of the left hip and pelvis 08/24/2022 MRI brain 08/20/2022 2D echo 08/22/2022 LTM EEG  Modified barium swallow 08/21/2022 Transfusion 2 units PRBCs 08/25/2022 CT head 09/07/2022    Medications:    amantadine  100 mg Oral Daily   amLODipine  10 mg Per Tube Daily   aspirin  81 mg Per Tube Daily   atorvastatin  80 mg Per Tube Daily   carvedilol  12.5 mg Per Tube BID WC   clopidogrel  75 mg Per Tube Daily   enoxaparin (LOVENOX) injection  40 mg Subcutaneous Q24H   feeding supplement (JEVITY 1.5 CAL/FIBER)  1,000 mL Per Tube Q24H   Gerhardt's butt cream   Topical QID   insulin aspart  0-15 Units Subcutaneous Q4H   insulin aspart  3 Units Subcutaneous Q4H   insulin glargine-yfgn  15 Units Subcutaneous Daily   lacosamide  50 mg Per Tube BID   lidocaine  1 patch Transdermal Q24H   multivitamin with minerals  1 tablet Per Tube Daily   mouth rinse  15 mL Mouth Rinse 4 times per day   sertraline  25 mg Per Tube QPM   Continuous Infusions:  sodium chloride 10 mL/hr at 09/11/22 0226     Anti-infectives (From admission, onward)    None              Family Communication/Anticipated D/C date and plan/Code Status   DVT prophylaxis: enoxaparin (LOVENOX) injection 40 mg Start: 08/28/22 1000 Place and maintain sequential compression device Start: 08/25/22 0839 SCDs Start: 08/18/22 2106     Code Status: DNR  Family Communication: Plan discussed with his wife at the bedside Disposition Plan: Plan to discharge to SNF   Status is: Inpatient Remains inpatient appropriate because: Awaiting placement to SNF       Subjective:   Interval events noted.  His wife said that his appetite is improving and he is eating more than normal.  She said he was more alert and interactive earlier this morning and he was sat up in the chair earlier this morning.  She thinks he is tired and that is why he is more sleepy.  Objective:     Vitals:   09/13/22 2300 09/14/22 0300 09/14/22 0856 09/14/22 1207  BP: 120/73 (!) 149/69 (!) 163/67 126/79  Pulse: 74  77 77  Resp: 17 16 16 17   Temp: 98 F (36.7 C) 98.3 F (36.8 C) 97.6 F (36.4 C) 97.7 F (36.5 C)  TempSrc: Oral Oral Oral Oral  SpO2: 99% 94% 96% 97%  Weight:      Height:       No data found.   Intake/Output Summary (Last 24 hours) at 09/14/2022 1526 Last data filed at 09/14/2022 1208 Gross per 24 hour  Intake 257 ml  Output 2000 ml  Net -1743 ml   Filed Weights   09/09/22 0500 09/10/22 0745 09/12/22 0454  Weight: 66.7 kg 65.4 kg 64.4 kg    Exam:  GEN: NAD SKIN: Warm and dry EYES: No pallor or icterus ENT: MMM, +  NG tube CV: RRR PULM: CTA B ABD: soft, ND, NT, +BS CNS: Drowsy and difficult to arouse. EXT: No edema or tenderness       Data Reviewed:   I have personally reviewed following labs and imaging studies:  Labs: Labs show the following:   Basic Metabolic Panel: Recent Labs  Lab 09/08/22 1318 09/09/22 0323 09/11/22 0710 09/12/22 1137  NA 134* 136 131* 132*  K 3.9 4.0 3.8 4.3  CL 101 103 98 99  CO2 26 26 25 25   GLUCOSE 143* 106* 111* 138*  BUN 39* 35* 27* 30*  CREATININE 0.73 0.70 0.76 0.79  CALCIUM 8.5* 8.4* 8.3* 8.5*  MG  --  2.0  --  2.1  PHOS  --  3.1  --   --    GFR Estimated Creatinine Clearance: 64.8 mL/min (by C-G formula based on SCr of 0.79 mg/dL). Liver Function Tests: Recent Labs  Lab 09/09/22 0323  ALBUMIN 2.2*   No results for input(s): "LIPASE", "AMYLASE" in the last 168 hours. No results for input(s): "AMMONIA" in the last 168 hours. Coagulation profile No results for input(s): "INR", "PROTIME" in the last 168 hours.  CBC: Recent Labs  Lab 09/08/22 1318 09/09/22 0323 09/12/22 1137  WBC 9.9 8.7 9.3  HGB 9.8* 9.5* 9.8*  HCT 30.6* 30.0* 30.5*  MCV 92.7 92.9 91.3  PLT 421* 393 405*   Cardiac Enzymes: No results for input(s): "CKTOTAL", "CKMB", "CKMBINDEX", "TROPONINI" in the last  168 hours. BNP (last 3 results) No results for input(s): "PROBNP" in the last 8760 hours. CBG: Recent Labs  Lab 09/13/22 1952 09/14/22 0032 09/14/22 0312 09/14/22 0853 09/14/22 1211  GLUCAP 181* 150* 133* 131* 172*   D-Dimer: No results for input(s): "DDIMER" in the last 72 hours. Hgb A1c: No results for input(s): "HGBA1C" in the last 72 hours. Lipid Profile: No results for input(s): "CHOL", "HDL", "LDLCALC", "TRIG", "CHOLHDL", "LDLDIRECT" in the last 72 hours. Thyroid function studies: No results for input(s): "TSH", "T4TOTAL", "T3FREE", "THYROIDAB" in the last 72 hours.  Invalid input(s): "FREET3" Anemia work up: No results for input(s): "VITAMINB12", "FOLATE", "FERRITIN", "TIBC", "IRON", "RETICCTPCT" in the last 72 hours. Sepsis Labs: Recent Labs  Lab 09/08/22 1318 09/09/22 0323 09/12/22 1137  WBC 9.9 8.7 9.3    Microbiology No results found for this or any previous visit (from the past 240 hour(s)).  Procedures and diagnostic studies:  No results found.             LOS: 27 days   Celinda Dethlefs  [employer] loss adjuster on [email or web address]. If 7PM-7AM, please contact night-coverage at [email or web address]     09/14/2022, 3:26 PM

## 2022-09-14 NOTE — Plan of Care (Signed)
  Problem: Clinical Measurements: Goal: Ability to maintain clinical measurements within normal limits will improve Outcome: Progressing Goal: Will remain free from infection Outcome: Progressing   Problem: Activity: Goal: Risk for activity intolerance will decrease Outcome: Progressing   Problem: Nutrition: Goal: Adequate nutrition will be maintained Outcome: Progressing   Problem: Elimination: Goal: Will not experience complications related to bowel motility Outcome: Progressing   Problem: Education: Goal: Ability to describe self-care measures that may prevent or decrease complications (Diabetes Survival Skills Education) will improve Outcome: Progressing Goal: Individualized Educational Video(s) Outcome: Progressing

## 2022-09-14 NOTE — Plan of Care (Signed)

## 2022-09-14 NOTE — Progress Notes (Signed)
Physical Therapy Treatment Patient Details Name: Raymond Sampson MRN: [id number] DOB: [birth year] Today's Date: 09/14/2022   History of Present Illness 82 year old male admitted 08/18/22 after fall at home. CT head 5/18 revealed: large Right Subdural Hematoma, most pronounced along the anterior right frontal convexity.  Developed increased lethargy and change in pupilary size on 5/16 and MRI sowed multiple punctate areas of restricted diffusion in bilateral frontal and parietal cortex suspicious for tiny acute infarcts. LTM EEG positive for nonconvulsive seizures. Pt with history of CAD, prior stroke, hypertension, hyperlipidemia, type 2 diabetes, prior fall in March this year with SNF stay, chronic antiplatelet use with aspirin and Plavix.    PT Comments    Patient remains anxious and fearful of falling. He tends to tense his entire body when trying to move from sit to stand for transfer training. He "freezes" when ~1/2 to 2/3 standing and cannot get hip to extend hips and knees the remaining amount. Because of this, could not stand enough to get flaps under his bottom for transfer to chair via steady. Once in chair, he reported need to have a BM. Attempted to get a bedpan, however by the time it came he had already been incontinent. He stood ~2 minutes for attempts at [employer] by [employer] fundraiser. Ultimately had to transfer pt back to bed due to incontinence of stool.     Recommendations for follow up therapy are one component of a multi-disciplinary discharge planning process, led by the attending physician.  Recommendations may be updated based on patient status, additional functional criteria and insurance authorization.  Follow Up Recommendations  Can patient physically be transported by private vehicle: No    Assistance Recommended at Discharge Frequent or constant Supervision/Assistance  Patient can return home with the following Two people to help with walking and/or transfers;Two people to help with  bathing/dressing/bathroom;Assist for transportation;Help with stairs or ramp for entrance;Direct supervision/assist for medications management;Direct supervision/assist for financial management;Assistance with cooking/housework;Assistance with feeding   Equipment Recommendations  Wheelchair (measurements PT);Wheelchair cushion (measurements PT);Hospital bed;Other (comment) (air mattress; hoyer lift; if goes home instead)    Recommendations for Other Services       Precautions / Restrictions Precautions Precautions: Fall Precaution Comments: Seizures; cortrak; buttocks wounds Restrictions Weight Bearing Restrictions: No     Mobility  Bed Mobility Overal bed mobility: Needs Assistance Bed Mobility: Supine to Sit, Sit to Supine Rolling: Mod assist (with rail) Sidelying to sit: Max assist, HOB elevated   Sit to supine: Total assist, +2 for physical assistance   General bed mobility comments: requires assist for rolling torso, to move LLE over EOB (pt moved RLE with incr time and cues), assistance with trunk to come to sit; returned to bed for cleaning s/p BM and required total assist of 2    Transfers Overall transfer level: Needs assistance   Transfers: Sit to/from Stand, Bed to chair/wheelchair/BSC Sit to Stand: Max assist, +2 physical assistance, From elevated surface (with stedy; unable to stand enough to move flaps)     Squat pivot transfers: Total assist, +2 physical assistance, From elevated surface     General transfer comment: pt unable to effectively stand and get flaps under his bottom to use stedy for bed to chair; after +2 total squat-pivot, pt with need for bedpan. By the time it arrived, he had already gone. RN in to assist with partial standing +2 max assist for pericare and squat pivot back to bed (for additional cleaning--unable to stand long enough).    Ambulation/Gait  General Gait Details: unable   Stairs             Wheelchair  Mobility    Modified Rankin (Stroke Patients Only) Modified Rankin (Stroke Patients Only) Pre-Morbid Rankin Score: Moderate disability Modified Rankin: Severe disability     Balance Overall balance assessment: Needs assistance Sitting-balance support: Single extremity supported, Feet supported, Bilateral upper extremity supported Sitting balance-Leahy Scale: Poor Sitting balance - Comments: Patient reliant on at least one extremity support. Postural control: Posterior lean, Left lateral lean Standing balance support: Bilateral upper extremity supported Standing balance-Leahy Scale: Zero Standing balance comment: face to face technique to stand from EOB and from recliner                            Cognition Arousal/Alertness: Awake/alert Behavior During Therapy: Anxious Overall Cognitive Status: Impaired/Different from baseline Area of Impairment: Attention, Memory, Following commands, Safety/judgement, Problem solving, Awareness                   Current Attention Level: Sustained Memory: Decreased short-term memory Following Commands: Follows one step commands inconsistently, Follows one step commands with increased time Safety/Judgement: Decreased awareness of safety, Decreased awareness of deficits Awareness: Intellectual Problem Solving: Slow processing, Decreased initiation, Difficulty sequencing, Requires verbal cues, Requires tactile cues General Comments: alert and following directions with increased time. Appeared fearful of falling when attempting to stand from EOB, even with stedy.        Exercises Other Exercises Other Exercises: static sitting on recliner ~5 minutes with minimal UE support and back unsupported    General Comments General comments (skin integrity, edema, etc.): Wife present and very helpful      Pertinent Vitals/Pain Pain Assessment Pain Assessment: No/denies pain    Home Living                          Prior  Function            PT Goals (current goals can now be found in the care plan section) Acute Rehab PT Goals Patient Stated Goal: agreeable to session Time For Goal Achievement: 09/20/22 Potential to Achieve Goals: Fair Progress towards PT goals: Progressing toward goals    Frequency    Min 3X/week      PT Plan Current plan remains appropriate    Co-evaluation              AM-PAC PT "6 Clicks" Mobility   Outcome Measure  Help needed turning from your back to your side while in a flat bed without using bedrails?: A Lot Help needed moving from lying on your back to sitting on the side of a flat bed without using bedrails?: Total Help needed moving to and from a bed to a chair (including a wheelchair)?: Total Help needed standing up from a chair using your arms (e.g., wheelchair or bedside chair)?: Total Help needed to walk in hospital room?: Total Help needed climbing 3-5 steps with a railing? : Total 6 Click Score: 7    End of Session Equipment Utilized During Treatment: Gait belt Activity Tolerance: Patient tolerated treatment well Patient left: in bed;with family/visitor present;with nursing/sitter in room (nursing continuing to clean pt) Nurse Communication: Mobility status;Need for lift equipment (recommend maximove for back to bed) PT Visit Diagnosis: Other symptoms and signs involving the nervous system (R29.898);Muscle weakness (generalized) (M62.81);Other abnormalities of gait and mobility (R26.89);Unsteadiness on feet (R26.81);Difficulty in walking,  not elsewhere classified (R26.2)     Time: 3244-0102 PT Time Calculation (min) (ACUTE ONLY): 39 min  Charges:  $Therapeutic Activity: 38-52 mins                      Jerolyn Center, PT Acute Rehabilitation Services  Office [phone]    Zena Amos 09/14/2022, 11:43 AM

## 2022-09-14 NOTE — TOC Progression Note (Signed)
Transition of Care [facility]) - Progression Note    Patient Details  Name: Raymond Sampson MRN: [id number] Date of Birth: 03/31/1941  Transition of Care [facility]) CM/SW Contact  Baldemar Lenis, [place] Phone Number: 09/14/2022, 3:11 PM  Clinical Narrative:   CSW received update from [facility] that they can offer a bed for patient. CSW met with spouse at bedside to provide update, she was very appreciative of update. CSW to follow.    Expected Discharge Plan: Skilled Nursing Facility Barriers to Discharge: English as a second language teacher, Continued Medical Work up  Expected Discharge Plan and Services       Living arrangements for the past 2 months: Single Family Home                                       Social Determinants of Health (SDOH) Interventions SDOH Screenings   Food Insecurity: No Food Insecurity (08/29/2022)  Housing: Low Risk  (08/29/2022)  Transportation Needs: No Transportation Needs (08/29/2022)  Utilities: Not At Risk (08/29/2022)  Depression (PHQ2-9): Low Risk  (05/17/2022)  Tobacco Use: Medium Risk (08/18/2022)    Readmission Risk Interventions     No data to display

## 2022-09-14 NOTE — Progress Notes (Signed)
Nutrition Follow-up  DOCUMENTATION CODES:  Severe malnutrition in context of chronic illness  INTERVENTION:  Continue diet per SLP recommendation Feeding assistance Continue nocturnal tube feeding via Cortrak tube, decrease amount slightly for improved intake: Jevity 1.5 at 70 ml/hr x 10 hours (700 ml per day) Provides 1050 kcal, 45 gm protein, 532 ml free water daily MVI with minerals daily  NUTRITION DIAGNOSIS:  Severe Malnutrition related to chronic illness (fall) as evidenced by severe fat depletion, severe muscle depletion. - remains applicable  GOAL:  Patient will meet greater than or equal to 90% of their needs - progressing, being met with TF at goal  MONITOR:  Diet advancement, TF tolerance  REASON FOR ASSESSMENT:  Consult Enteral/tube feeding initiation and management  ASSESSMENT:  Pt with PMH of CAD, CVA 2012, DM, HLD, and HTN admitted with traumatic SDH.   5/21 - seizures 5/22 s/p cortrak placement, per xray tip in gastric antrum  5/29 - SLP evaluation, NPO but pt allowed to have sips of honey thick liquids and puree from floor stock 6/4 - SLP continues to recommend NPO with sips of honey thick liquids 6/7 - MBS, DYS1 with honey thick; pt with aspiration of larger sips of honey thick liquids and with thin and nectar. 6/9-6/11 - Kcal count, pt meeting ~50% of needs  Pt resting in bed at the time of assessment. Wife at bedside. Discussed the results of kcal count with wife and she has saved the tickets from yesterday which were added up and included below. Wife reports she had a good discussion with PMT yesterday and is focusing on offering the highest kcal/protein foods first. Recommended consumed the meat, then the magic cup, and then whatever pt would consume best after.   Pt consumed the majority of his meal this AM. Wife reports that she used a straw for liquids and that seemed to be much more effective. Unsure if this strategy was approved by SLP. Wife also  interested in seeing if pt can trial soft foods with dentures the next time SLP evaluates.   6/12 Breakfast: 441kcal, 27g protein  6/12 Lunch 538 kcal, 37g protein 6/12 Dinner 413kcal, 20g protein  Total intake 6/12: 1392 kcal (87% of minimum estimated needs)  84 protein (100% of minimum estimated needs)  Nutritionally Relevant Medications: Scheduled Meds:  atorvastatin  80 mg Per Tube Daily   feeding supplement (JEVITY 1.5 CAL/FIBER)  1,000 mL Per Tube Q24H   insulin aspart  0-15 Units Subcutaneous Q4H   insulin aspart  3 Units Subcutaneous Q4H   insulin glargine-yfgn  15 Units Subcutaneous Daily   multivitamin with minerals  1 tablet Per Tube Daily   PRN Meds: ondansetron  Labs Reviewed: Na 132 BUN 30 CBG ranges from 85-138 mg/dL over the last 24 hours HgbA1c: 6.0%  NUTRITION - FOCUSED PHYSICAL EXAM: Flowsheet Row Most Recent Value  Orbital Region Severe depletion  Upper Arm Region Severe depletion  Thoracic and Lumbar Region Severe depletion  Buccal Region Severe depletion  Temple Region Severe depletion  Clavicle Bone Region Severe depletion  Clavicle and Acromion Bone Region Severe depletion  Scapular Bone Region Severe depletion  Dorsal Hand Severe depletion  Patellar Region Severe depletion  Anterior Thigh Region Severe depletion  Posterior Calf Region Severe depletion  Edema (RD Assessment) None  Hair Reviewed  Eyes Unable to assess  Mouth Reviewed  Skin Reviewed  [+ecchymosis]  Nails Reviewed   Diet Order:   Diet Order  DIET - DYS 1 Room service appropriate? No; Fluid consistency: Honey Thick  Diet effective now                   EDUCATION NEEDS:  Not appropriate for education at this time  Skin:  Skin Assessment: Reviewed RN Assessment (laceration: face) Skin tear - right coccyx (1 cm x 1 cm)  Last BM:  6/13 - type 6  Height:  Ht Readings from Last 1 Encounters:  08/18/22 5\' 7"  (1.702 m)    Weight:  Wt Readings from Last  1 Encounters:  09/12/22 64.4 kg    BMI:  Body mass index is 22.24 kg/m.  Estimated Nutritional Needs:  Kcal:  1600-1800 kcal/d Protein:  80-100g/d Fluid:  >1.7 L/day    Greig Castilla, RD, LDN Clinical Dietitian RD pager # available in [facility]  After hours/weekend pager # available in [facility]

## 2022-09-15 LAB — GLUCOSE, CAPILLARY
Glucose-Capillary: 115 mg/dL — ABNORMAL HIGH (ref 70–99)
Glucose-Capillary: 126 mg/dL — ABNORMAL HIGH (ref 70–99)
Glucose-Capillary: 162 mg/dL — ABNORMAL HIGH (ref 70–99)
Glucose-Capillary: 171 mg/dL — ABNORMAL HIGH (ref 70–99)
Glucose-Capillary: 173 mg/dL — ABNORMAL HIGH (ref 70–99)
Glucose-Capillary: 213 mg/dL — ABNORMAL HIGH (ref 70–99)
Glucose-Capillary: 97 mg/dL (ref 70–99)

## 2022-09-15 LAB — CBC
HCT: 32 % — ABNORMAL LOW (ref 39.0–52.0)
Hemoglobin: 10.1 g/dL — ABNORMAL LOW (ref 13.0–17.0)
MCH: 29.7 pg (ref 26.0–34.0)
MCHC: 31.6 g/dL (ref 30.0–36.0)
MCV: 94.1 fL (ref 80.0–100.0)
Platelets: 379 10*3/uL (ref 150–400)
RBC: 3.4 MIL/uL — ABNORMAL LOW (ref 4.22–5.81)
RDW: 15.7 % — ABNORMAL HIGH (ref 11.5–15.5)
WBC: 9.4 10*3/uL (ref 4.0–10.5)
nRBC: 0 % (ref 0.0–0.2)

## 2022-09-15 LAB — BASIC METABOLIC PANEL
Anion gap: 8 (ref 5–15)
BUN: 40 mg/dL — ABNORMAL HIGH (ref 8–23)
CO2: 27 mmol/L (ref 22–32)
Calcium: 8.6 mg/dL — ABNORMAL LOW (ref 8.9–10.3)
Chloride: 99 mmol/L (ref 98–111)
Creatinine, Ser: 0.95 mg/dL (ref 0.61–1.24)
GFR, Estimated: >60 mL/min (ref >60)
Glucose, Bld: 183 mg/dL — ABNORMAL HIGH (ref 70–99)
Potassium: 4.1 mmol/L (ref 3.5–5.1)
Sodium: 134 mmol/L — ABNORMAL LOW (ref 135–145)

## 2022-09-15 MED ORDER — INSULIN ASPART 100 UNIT/ML IJ SOLN
0.0000 [IU] | Freq: Three times a day (TID) | INTRAMUSCULAR | Status: DC
  Administered 2022-09-15 (×2): 3 [IU] via SUBCUTANEOUS
  Administered 2022-09-16 (×2): 2 [IU] via SUBCUTANEOUS
  Administered 2022-09-16: 3 [IU] via SUBCUTANEOUS
  Administered 2022-09-16 – 2022-09-17 (×2): 2 [IU] via SUBCUTANEOUS
  Administered 2022-09-17 (×2): 3 [IU] via SUBCUTANEOUS
  Administered 2022-09-17: 2 [IU] via SUBCUTANEOUS
  Administered 2022-09-18: 3 [IU] via SUBCUTANEOUS
  Administered 2022-09-18: 2 [IU] via SUBCUTANEOUS
  Administered 2022-09-18: 5 [IU] via SUBCUTANEOUS
  Administered 2022-09-19: 3 [IU] via SUBCUTANEOUS

## 2022-09-15 MED ORDER — BOOST PLUS PO LIQD
237.0000 mL | Freq: Two times a day (BID) | ORAL | Status: DC
  Administered 2022-09-15 – 2022-09-20 (×11): 237 mL via ORAL
  Filled 2022-09-15 (×13): qty 237

## 2022-09-15 NOTE — Discharge Instructions (Signed)

## 2022-09-15 NOTE — Progress Notes (Signed)
PROGRESS NOTE   Raymond Sampson  VHQ:[id number] DOB: 10/24/1940 DOA: 08/18/2022 PCP: Gracelyn Nurse, MD   Date of Service: the patient was seen and examined on 09/15/2022  Brief Narrative:  Raymond Sampson is a 82 y.o. male with PMH of CAD, CVA, HTN, HLD, DM-2, cervical spine fusion and OA presented to ED after fall at home and found to have traumatic right frontal SDH measuring 2.2 cm in maximum thickness with 5 to 6 mm midline shift to the left, and trace SAH at the right temporal lobe.  Patient was on Plavix and aspirin.  CT cervical spine without acute finding but some sclerotic changes at C2-C3 facet joint.  He was admitted to ICU by neurosurgery. He was initially minimally symptomatic and refused surgery.  However, he developed intermittent focal seizure.    Repeat CT head on 5/18 with stable SDH and SAH.  MRI brain showed multiple small/punctate areas of restricted diffusion with bilateral frontal and parietal cortex suspicious for tiny acute infarcts or small intraparenchymal contusion and/or shear type injury in the setting of trauma.  EEG on 5/21 showed intermittent seizure.  Neurology consulted.  Patient was started on Keppra and Vimpat.  Palliative medicine consulted as well.   Patient was transferred to hospitalist service on 5/23.  Subsequent LTM EEG without seizure.  Neurology signed off.  Encephalopathy improved but seems to be delirious.  Remains NPO except some pured diet from nursing stock.  He is on tube feed via cortrack.  SLP following.  Therapy recommended SNF. Patient remains very sleepy and lethargic, SLP unable to assess him due to sleepiness. Repeat CT head without contrast showed unchanged subdural hematoma.   Assessment and Plan: No notes have been filed under this hospital service. Service: Hospitalist  Patient is medically stable for discharge, awaiting placement  Acute metabolic encephalopathy Traumatic right subdural hematoma/subarachnoid hemorrhage with  midline shift secondary to fall and antiplatelet therapy, stable Provoked seizures Patient presented with a fall found to have a traumatic right SDH/SAH with midline shift. Repeat head CT done showed a stable hematoma.Patient assessed by neurosurgery who recommended no surgical intervention at that time. Recommendations were to hold aspirin and Plavix for a week and subsequently these have been resumed. Patient had subsequent LTM EEG with evidence of epileptogenicity arising from the right central parietal region. -Neurosurgery recommended outpatient follow-up. -Continue Vimpat at 50 twice daily  -Amantadine 100mg  daily -Seizure precautions  -PT/OT recommended SNF.  Severe protein calorie malnutrition -REMOVE cortrack and tube feeds-RN notified -Continue nutritional supplementation. -Dysphagia 1 diet with honey thick liquids. -Dietitian following, calorie count underway.  Fall at home, left frontal laceration S/p repair with stitches. Sutures removed 08/27/2022   Back pain Likely secondary to fall. X-rays are negative for any acute findings. -Lidoderm patch, pain control, PT/OT.   Acute blood loss anemia Hb 10 today. Baseline 9-10. Patient with no overt bleeding. S/p 2 units PRBCs. -Continue to monitor CBC   CAD status post PCI/DES stent to LCx/OM 2 in January 2024. -Continue Coreg, Lipitor, Plavix, aspirin.    Well-controlled non-insulin-dependent diabetes mellitus seen CBGs 97-173 overnight. Last Hemoglobin A1c 6.0 (08/21/2022) -Continue Semglee 15 units daily -Discontinue NovoLog 3 units every 4 hours as cor track is being removed -Continue SSI 4 times daily with meals   Hypertension BP 143/78 overnight -Continue Coreg, Norvasc.        Hypokalemia/hypophosphatemia: Resolved -Continue to monitor as needed   History of CVA -Continue Plavix, aspirin, Lipitor for secondary stroke prophylaxis.  Hyponatremia Sodium 134, improved from 132 yesterday -Continue to monitor  BMP   Dysphagia -Patient on  dysphagia 1 diet thickened pure diet with honey thick liquids..  Coughing and congestion Chest x-ray 09/10/2022  no acute infiltrate. -Robitussin as needed.  Mood disorder -Continue Zoloft.     Subjective:  Pt reports no acute events overnight.  Wife at bedside who was updated on the patient's condition.  Physical Exam:  Vitals:   09/14/22 2021 09/14/22 2317 09/15/22 0345 09/15/22 0759  BP: (!) 131/59 136/72 138/74 (!) 143/78  Pulse: 81 80 81 83  Resp: 16 16 16 19   Temp: 98.9 F (37.2 C) 99 F (37.2 C) 98.7 F (37.1 C) 98.4 F (36.9 C)  TempSrc: Axillary Axillary Axillary Oral  SpO2: 95% 97% 96% 95%  Weight:      Height:        General: Alert, no acute distress, frail-appearing, cortrak in situ Cardio: Normal S1 and S2, RRR, no r/m/g Pulm: CTAB, normal work of breathing Abdomen: Bowel sounds normal. Abdomen soft and non-tender.  Extremities: No peripheral edema.  Neuro: Cranial nerves grossly intact   Data Reviewed:  I have personally reviewed and interpreted labs, imaging.   CBC: Recent Labs  Lab 09/08/22 1318 09/09/22 0323 09/12/22 1137 09/15/22 0425  WBC 9.9 8.7 9.3 9.4  HGB 9.8* 9.5* 9.8* 10.1*  HCT 30.6* 30.0* 30.5* 32.0*  MCV 92.7 92.9 91.3 94.1  PLT 421* 393 405* 379   Basic Metabolic Panel: Recent Labs  Lab 09/08/22 1318 09/09/22 0323 09/11/22 0710 09/12/22 1137 09/15/22 0425  NA 134* 136 131* 132* 134*  K 3.9 4.0 3.8 4.3 4.1  CL 101 103 98 99 99  CO2 26 26 25 25 27   GLUCOSE 143* 106* 111* 138* 183*  BUN 39* 35* 27* 30* 40*  CREATININE 0.73 0.70 0.76 0.79 0.95  CALCIUM 8.5* 8.4* 8.3* 8.5* 8.6*  MG  --  2.0  --  2.1  --   PHOS  --  3.1  --   --   --    GFR: Estimated Creatinine Clearance: 54.6 mL/min (by C-G formula based on SCr of 0.95 mg/dL). Liver Function Tests: Recent Labs  Lab 09/09/22 0323  ALBUMIN 2.2*    Coagulation Profile: No results for input(s): "INR", "PROTIME" in the last 168  hours.   Code Status:  DNR.  Code status decision has been confirmed with: Patient Family Communication: Updated wife at bedside   Severity of Illness:  The appropriate patient status for this patient is INPATIENT. Inpatient status is judged to be reasonable and necessary in order to provide the required intensity of service to ensure the patient's safety. The patient's presenting symptoms, physical exam findings, and initial radiographic and laboratory data in the context of their chronic comorbidities is felt to place them at high risk for further clinical deterioration. Furthermore, it is not anticipated that the patient will be medically stable for discharge from the hospital within 2 midnights of admission.   * I certify that at the point of admission it is my clinical judgment that the patient will require inpatient hospital care spanning beyond 2 midnights from the point of admission due to high intensity of service, high risk for further deterioration and high frequency of surveillance required.*   Author:  Rolm Gala MD  09/15/2022 8:59 AM

## 2022-09-15 NOTE — Progress Notes (Signed)
Nutrition Follow-up  DOCUMENTATION CODES:  Severe malnutrition in context of chronic illness  INTERVENTION:  Continue diet per SLP recommendation Feeding assistance PO intake is acceptable, recommend removing cortrak tube and discontinuing nocturnal feeds.  MVI with minerals daily Boost Plus BID to provide 360 kcal and 14 g protein per carton  NUTRITION DIAGNOSIS:  Severe Malnutrition related to chronic illness (fall) as evidenced by severe fat depletion, severe muscle depletion. - remains applicable  GOAL:  Patient will meet greater than or equal to 90% of their needs - progressing, being met with TF at goal  MONITOR:  Diet advancement, TF tolerance  REASON FOR ASSESSMENT:  Consult Enteral/tube feeding initiation and management  ASSESSMENT:  Pt with PMH of CAD, CVA 2012, DM, HLD, and HTN admitted with traumatic SDH.   5/21 - seizures 5/22 s/p cortrak placement, per xray tip in gastric antrum  5/29 - SLP evaluation, NPO but pt allowed to have sips of honey thick liquids and puree from floor stock 6/4 - SLP continues to recommend NPO with sips of honey thick liquids 6/7 - MBS, DYS1 with honey thick; pt with aspiration of larger sips of honey thick liquids and with thin and nectar. 6/9-6/11 - Kcal count, pt meeting ~50% of needs  Pt resting in bedside chair at the time of assessment. Reviewed meal tickets over the last 24 hours with wife. Pt focusing on eating his meats, magic cup, and then carbohydrate source on tray and over the last 24 hours met >100% of his nutrition needs. Discussed with wife that if she was able to continue helping pt consume these foods, he will be able to meet his needs and I feel comfortable removing his cortrak.  Pt does have a SNF rehab bed when he is medically stable. Discussed improved intake with provider, CM, and RN.  Nutritionally Relevant Medications: Scheduled Meds:  atorvastatin  80 mg Per Tube Daily   feeding supplement (JEVITY 1.5  CAL/FIBER)  1,000 mL Per Tube Q24H   insulin aspart  0-15 Units Subcutaneous Q4H   insulin aspart  3 Units Subcutaneous Q4H   insulin glargine-yfgn  15 Units Subcutaneous Daily   multivitamin with minerals  1 tablet Per Tube Daily   PRN Meds: ondansetron  Labs Reviewed: Na 132 BUN 30 CBG ranges from 85-138 mg/dL over the last 24 hours HgbA1c: 6.0%  NUTRITION - FOCUSED PHYSICAL EXAM: Flowsheet Row Most Recent Value  Orbital Region Severe depletion  Upper Arm Region Severe depletion  Thoracic and Lumbar Region Severe depletion  Buccal Region Severe depletion  Temple Region Severe depletion  Clavicle Bone Region Severe depletion  Clavicle and Acromion Bone Region Severe depletion  Scapular Bone Region Severe depletion  Dorsal Hand Severe depletion  Patellar Region Severe depletion  Anterior Thigh Region Severe depletion  Posterior Calf Region Severe depletion  Edema (RD Assessment) None  Hair Reviewed  Eyes Unable to assess  Mouth Reviewed  Skin Reviewed  [+ecchymosis]  Nails Reviewed   Diet Order:   Diet Order             DIET - DYS 1 Room service appropriate? No; Fluid consistency: Honey Thick  Diet effective now                   EDUCATION NEEDS:  Not appropriate for education at this time  Skin:  Skin Assessment: Reviewed RN Assessment (laceration: face) Skin tear - right coccyx (1 cm x 1 cm)  Last BM:  6/14 - type 6  Height:  Ht Readings from Last 1 Encounters:  08/18/22 5\' 7"  (1.702 m)    Weight:  Wt Readings from Last 1 Encounters:  09/12/22 64.4 kg    BMI:  Body mass index is 22.24 kg/m.  Estimated Nutritional Needs:  Kcal:  1600-1800 kcal/d Protein:  80-100g/d Fluid:  >1.7 L/day    Greig Castilla, RD, LDN Clinical Dietitian RD pager # available in [facility]  After hours/weekend pager # available in [facility]

## 2022-09-15 NOTE — Plan of Care (Signed)

## 2022-09-15 NOTE — Progress Notes (Signed)
Occupational Therapy Treatment Patient Details Name: Raymond Sampson MRN: [id number] DOB: 1940-04-05 Today's Date: 09/15/2022   History of present illness 82 year old male admitted 08/18/22 after fall at home. CT head 5/18 revealed: large Right Subdural Hematoma, most pronounced along the anterior right frontal convexity.  Developed increased lethargy and change in pupilary size on 5/16 and MRI sowed multiple punctate areas of restricted diffusion in bilateral frontal and parietal cortex suspicious for tiny acute infarcts. LTM EEG positive for nonconvulsive seizures. Pt with history of CAD, prior stroke, hypertension, hyperlipidemia, type 2 diabetes, prior fall in March this year with SNF stay, chronic antiplatelet use with aspirin and Plavix.   OT comments  Patient demonstrating good gains this treatment session with mod assist of 2 to get to EOB and able to perform transfer to [facility] with max assist +2 and stood for toilet hygiene with 2 to stand and another for hygiene. Patient demonstrates difficulty coming to full stand and continues to appear fearful of falling. Patient will benefit from continued inpatient follow up therapy, <3 hours/day to address self care and functional transfers.    Recommendations for follow up therapy are one component of a multi-disciplinary discharge planning process, led by the attending physician.  Recommendations may be updated based on patient status, additional functional criteria and insurance authorization.    Assistance Recommended at Discharge Frequent or constant Supervision/Assistance  Patient can return home with the following  Two people to help with walking and/or transfers;Two people to help with bathing/dressing/bathroom;Help with stairs or ramp for entrance;Assist for transportation;Direct supervision/assist for financial management;Assistance with feeding;Direct supervision/assist for medications management;Assistance with cooking/housework   Equipment  Recommendations  Other (comment) (defer)    Recommendations for Other Services      Precautions / Restrictions Precautions Precautions: Fall Precaution Comments: Seizures; cortrak; buttocks wounds Restrictions Weight Bearing Restrictions: No       Mobility Bed Mobility Overal bed mobility: Needs Assistance Bed Mobility: Supine to Sit   Sidelying to sit: HOB elevated, Mod assist, +2 for physical assistance       General bed mobility comments: began moving BLEs off towards EOB and required assistance to complete and assistance with trunk    Transfers Overall transfer level: Needs assistance Equipment used: None (two person assist for transfer, patient used bed rail to assist with sit to stands) Transfers: Sit to/from Stand, Bed to chair/wheelchair/BSC Sit to Stand: Mod assist, +2 physical assistance   Squat pivot transfers: Max assist, +2 physical assistance       General transfer comment: Patient not able to come to complete stand for transfer or general standing     Balance Overall balance assessment: Needs assistance Sitting-balance support: Single extremity supported, Feet supported, Bilateral upper extremity supported Sitting balance-Leahy Scale: Poor Sitting balance - Comments: Patient reliant on at least one extremity support. Postural control: Posterior lean   Standing balance-Leahy Scale: Poor Standing balance comment: stood x5 times from recliner using bed rail with patient unable to tolerate prolonged standign                           ADL either performed or assessed with clinical judgement   ADL Overall ADL's : Needs assistance/impaired                 Upper Body Dressing : Moderate assistance;Sitting Upper Body Dressing Details (indicate cue type and reason): to don new gown     Toilet Transfer: Maximal assistance;+2 for physical assistance;BSC/3in1;Squat-pivot  Toilet Transfer Details (indicate cue type and reason): two person  squat pivot transfer to [facility] Toileting- Clothing Manipulation and Hygiene: Maximal assistance;+2 for physical assistance;Sit to/from stand Toileting - Clothing Manipulation Details (indicate cue type and reason): 2 person assist to stand for toilet hygiene and assistance of another to perform hygiene            Extremity/Trunk Assessment              Vision       Perception     Praxis      Cognition Arousal/Alertness: Awake/alert Behavior During Therapy: Anxious Overall Cognitive Status: Impaired/Different from baseline Area of Impairment: Attention, Memory, Following commands, Safety/judgement, Problem solving, Awareness                   Current Attention Level: Sustained Memory: Decreased short-term memory Following Commands: Follows one step commands inconsistently, Follows one step commands with increased time Safety/Judgement: Decreased awareness of safety, Decreased awareness of deficits Awareness: Intellectual Problem Solving: Slow processing, Decreased initiation, Difficulty sequencing, Requires verbal cues, Requires tactile cues General Comments: continues to demonstrate fear of falling when getting to EOB, standing and during transfers.        Exercises      Shoulder Instructions       General Comments      Pertinent Vitals/ Pain       Pain Assessment Pain Assessment: Faces Faces Pain Scale: Hurts little more Pain Location: generalized while on EOB and during mobility Pain Descriptors / Indicators: Discomfort, Grimacing Pain Intervention(s): Limited activity within patient's tolerance, Monitored during session, Repositioned  Home Living                                          Prior Functioning/Environment              Frequency  Min 2X/week        Progress Toward Goals  OT Goals(current goals can now be found in the care plan section)  Progress towards OT goals: Progressing toward goals  Acute Rehab OT  Goals Patient Stated Goal: none stated OT Goal Formulation: With patient Time For Goal Achievement: 09/19/22 Potential to Achieve Goals: Fair ADL Goals Pt Will Perform Grooming: with mod assist;bed level Additional ADL Goal #1: Pt will follow 1 step commands with 50% accuracy given increased time. Additional ADL Goal #2: Pt will complete bed mobility with max assist as precursor to ADLs.  Plan Discharge plan remains appropriate;Frequency remains appropriate    Co-evaluation    PT/OT/SLP Co-Evaluation/Treatment: Yes Reason for Co-Treatment: Necessary to address cognition/behavior during functional activity;To address functional/ADL transfers;For patient/therapist safety PT goals addressed during session: Mobility/safety with mobility;Balance;Strengthening/ROM;Proper use of DME OT goals addressed during session: ADL's and self-care      AM-PAC OT "6 Clicks" Daily Activity     Outcome Measure   Help from another person eating meals?: Total Help from another person taking care of personal grooming?: A Lot Help from another person toileting, which includes using toliet, bedpan, or urinal?: Total Help from another person bathing (including washing, rinsing, drying)?: Total Help from another person to put on and taking off regular upper body clothing?: A Lot Help from another person to put on and taking off regular lower body clothing?: Total 6 Click Score: 8    End of Session Equipment Utilized During Treatment: Gait belt  OT Visit  Diagnosis: Other abnormalities of gait and mobility (R26.89);Muscle weakness (generalized) (M62.81);Other symptoms and signs involving cognitive function;Other symptoms and signs involving the nervous system (R29.898)   Activity Tolerance Patient tolerated treatment well   Patient Left in chair;with call bell/phone within reach;with chair alarm set   Nurse Communication Mobility status        Time: 1610-9604 OT Time Calculation (min): 24  min  Charges: OT General Charges $OT Visit: 1 Visit OT Treatments $Self Care/Home Management : 8-22 mins  Alfonse Flavors, OTA Acute Rehabilitation Services  Office [phone]   Dewain Penning 09/15/2022, 1:14 PM

## 2022-09-15 NOTE — TOC Progression Note (Signed)
Transition of Care [facility]) - Progression Note    Patient Details  Name: Raymond Sampson MRN: [id number] Date of Birth: [birth year]  Transition of Care [facility]) CM/SW Contact  Baldemar Lenis, [place] Phone Number: 09/15/2022, 2:10 PM  Clinical Narrative:   CSW noting that patient is improving with oral intake, should be removing cortrak soon. CSW updated [facility], they will have bed available on Monday, pending insurance approval. CSW contacted [employer] to initiate insurance authorization for SNF and PTAR, awaiting approval. CSW to follow.    Expected Discharge Plan: Skilled Nursing Facility Barriers to Discharge: English as a second language teacher, Continued Medical Work up  Expected Discharge Plan and Services       Living arrangements for the past 2 months: Single Family Home                                       Social Determinants of Health (SDOH) Interventions SDOH Screenings   Food Insecurity: No Food Insecurity (08/29/2022)  Housing: Low Risk  (08/29/2022)  Transportation Needs: No Transportation Needs (08/29/2022)  Utilities: Not At Risk (08/29/2022)  Depression (PHQ2-9): Low Risk  (05/17/2022)  Tobacco Use: Medium Risk (08/18/2022)    Readmission Risk Interventions     No data to display

## 2022-09-15 NOTE — Progress Notes (Signed)
Physical Therapy Treatment Patient Details Name: Raymond Sampson MRN: [id number] DOB: 09/24/1940 Today's Date: 09/15/2022   History of Present Illness 82 year old male admitted 08/18/22 after fall at home. CT head 5/18 revealed: large Right Subdural Hematoma, most pronounced along the anterior right frontal convexity.  Developed increased lethargy and change in pupilary size on 5/16 and MRI sowed multiple punctate areas of restricted diffusion in bilateral frontal and parietal cortex suspicious for tiny acute infarcts. LTM EEG positive for nonconvulsive seizures. Pt with history of CAD, prior stroke, hypertension, hyperlipidemia, type 2 diabetes, prior fall in March this year with SNF stay, chronic antiplatelet use with aspirin and Plavix.    PT Comments    Pt supine in bed on arrival this session.  He reports need to have BM.  Transferred to commode and recliner via squat pivot.  Sit to stand with flexed kyphotic posture.  Will continue to follow.    Recommendations for follow up therapy are one component of a multi-disciplinary discharge planning process, led by the attending physician.  Recommendations may be updated based on patient status, additional functional criteria and insurance authorization.  Follow Up Recommendations  Can patient physically be transported by private vehicle: No    Assistance Recommended at Discharge Frequent or constant Supervision/Assistance  Patient can return home with the following Two people to help with walking and/or transfers;Two people to help with bathing/dressing/bathroom;Assist for transportation;Help with stairs or ramp for entrance;Direct supervision/assist for medications management;Direct supervision/assist for financial management;Assistance with cooking/housework;Assistance with feeding   Equipment Recommendations  Wheelchair (measurements PT);Wheelchair cushion (measurements PT);Hospital bed;Other (comment)    Recommendations for Other Services        Precautions / Restrictions Precautions Precautions: Fall Precaution Comments: Seizures; cortrak; buttocks wounds Restrictions Weight Bearing Restrictions: No     Mobility  Bed Mobility Overal bed mobility: Needs Assistance Bed Mobility: Supine to Sit   Sidelying to sit: HOB elevated, Mod assist, +2 for physical assistance       General bed mobility comments: Require asssitance to advance LEs to edge of bed and elevate trunk into sitting.    Transfers Overall transfer level: Needs assistance Equipment used:  (used rail on bed in elevated height to pull into standing.) Transfers: Sit to/from Stand, Bed to chair/wheelchair/BSC Sit to Stand: Mod assist, +2 physical assistance (unable to achieve full standing as B knee appear contracted as well as presenting with kyphosis.)     Squat pivot transfers: Max assist, +2 physical assistance     General transfer comment: Pt had better success with squat pivot to bedside commode before initiating transfer training.  He successfully had BM on commode and performed transfer training from chair to stand with pulling on rail for support x 5.  Pt with better carryover initiating forward weight shifting.    Ambulation/Gait                   Stairs             Wheelchair Mobility    Modified Rankin (Stroke Patients Only)       Balance Overall balance assessment: Needs assistance Sitting-balance support: Single extremity supported, Feet supported, Bilateral upper extremity supported Sitting balance-Leahy Scale: Poor Sitting balance - Comments: Patient reliant on at least one extremity support. Postural control: Posterior lean   Standing balance-Leahy Scale: Poor  Cognition Arousal/Alertness: Awake/alert Behavior During Therapy: Anxious Overall Cognitive Status: Impaired/Different from baseline Area of Impairment: Attention, Memory, Following commands,  Safety/judgement, Problem solving, Awareness                   Current Attention Level: Sustained Memory: Decreased short-term memory Following Commands: Follows one step commands inconsistently, Follows one step commands with increased time Safety/Judgement: Decreased awareness of safety, Decreased awareness of deficits Awareness: Intellectual Problem Solving: Slow processing, Decreased initiation, Difficulty sequencing, Requires verbal cues, Requires tactile cues General Comments: alert and following directions with increased time. Appeared fearful of falling when attempting to stand from EOB but better carryover with sit to stand from chair pulling on bed rail into standing.        Exercises      General Comments        Pertinent Vitals/Pain Pain Assessment Pain Assessment: Faces Faces Pain Scale: Hurts little more Pain Location: generalized while on EOB and during mobility Pain Descriptors / Indicators: Discomfort, Grimacing Pain Intervention(s): Monitored during session, Repositioned    Home Living                          Prior Function            PT Goals (current goals can now be found in the care plan section) Acute Rehab PT Goals Patient Stated Goal: agreeable to session Potential to Achieve Goals: Fair Progress towards PT goals: Progressing toward goals    Frequency    Min 3X/week      PT Plan Current plan remains appropriate    Co-evaluation PT/OT/SLP Co-Evaluation/Treatment: Yes Reason for Co-Treatment: Necessary to address cognition/behavior during functional activity;To address functional/ADL transfers;For patient/therapist safety PT goals addressed during session: Mobility/safety with mobility;Balance;Strengthening/ROM;Proper use of DME        AM-PAC PT "6 Clicks" Mobility   Outcome Measure  Help needed turning from your back to your side while in a flat bed without using bedrails?: A Lot Help needed moving from lying on  your back to sitting on the side of a flat bed without using bedrails?: A Lot Help needed moving to and from a bed to a chair (including a wheelchair)?: Total Help needed standing up from a chair using your arms (e.g., wheelchair or bedside chair)?: Total Help needed to walk in hospital room?: Total Help needed climbing 3-5 steps with a railing? : Total 6 Click Score: 8    End of Session Equipment Utilized During Treatment: Gait belt Activity Tolerance: Patient tolerated treatment well Patient left: in chair;with call bell/phone within reach Nurse Communication: Mobility status (use bed pad for back to bed.) PT Visit Diagnosis: Other symptoms and signs involving the nervous system (R29.898);Muscle weakness (generalized) (M62.81);Other abnormalities of gait and mobility (R26.89);Unsteadiness on feet (R26.81);Difficulty in walking, not elsewhere classified (R26.2)     Time: 1610-9604 PT Time Calculation (min) (ACUTE ONLY): 24 min  Charges:  $Therapeutic Activity: 8-22 mins                     Raymond Sampson , PTA Acute Rehabilitation Services Office [phone]    Raymond Sampson 09/15/2022, 11:13 AM

## 2022-09-15 NOTE — Plan of Care (Signed)
  Problem: Health Behavior/Discharge Planning: Goal: Ability to manage health-related needs will improve Outcome: Progressing   Problem: Clinical Measurements: Goal: Ability to maintain clinical measurements within normal limits will improve Outcome: Progressing Goal: Will remain free from infection Outcome: Progressing Goal: Diagnostic test results will improve Outcome: Progressing Goal: Respiratory complications will improve Outcome: Progressing Goal: Cardiovascular complication will be avoided Outcome: Progressing   Problem: Activity: Goal: Risk for activity intolerance will decrease Outcome: Progressing Note: Patient is bedrest. He is encouraged to help when re-positioning him   Problem: Nutrition: Goal: Adequate nutrition will be maintained Outcome: Progressing Note: Patient has Jevity 1.5 @ 70 from 2000pm to 0600am. Patient is tolerating the tube feeding well.   Problem: Coping: Goal: Level of anxiety will decrease Outcome: Progressing Note: Patient is calm and sleeping at this time   Problem: Elimination: Goal: Will not experience complications related to bowel motility Outcome: Progressing Goal: Will not experience complications related to urinary retention Outcome: Progressing   Problem: Pain Managment: Goal: General experience of comfort will improve Outcome: Progressing Note: Patient denies any pain at this time   Problem: Safety: Goal: Ability to remain free from injury will improve Outcome: Progressing   Problem: Skin Integrity: Goal: Risk for impaired skin integrity will decrease Outcome: Progressing Note: Patient's skin was kept CD&I at all times on this shift to prevent skin breakdown. Patient was re-positioned q2hrs   Problem: Education: Goal: Ability to describe self-care measures that may prevent or decrease complications (Diabetes Survival Skills Education) will improve Outcome: Progressing Goal: Individualized Educational Video(s) Outcome:  Progressing   Problem: Coping: Goal: Ability to adjust to condition or change in health will improve Outcome: Progressing   Problem: Fluid Volume: Goal: Ability to maintain a balanced intake and output will improve Outcome: Progressing   Problem: Health Behavior/Discharge Planning: Goal: Ability to identify and utilize available resources and services will improve Outcome: Progressing Goal: Ability to manage health-related needs will improve Outcome: Progressing   Problem: Metabolic: Goal: Ability to maintain appropriate glucose levels will improve Outcome: Progressing   Problem: Nutritional: Goal: Maintenance of adequate nutrition will improve Outcome: Progressing Goal: Progress toward achieving an optimal weight will improve Outcome: Progressing   Problem: Skin Integrity: Goal: Risk for impaired skin integrity will decrease Outcome: Progressing Note: Patient's skin was kept CD&I at all times on this shift to prevent skin breakdown. Patient was re-positioned q2hrs   Problem: Tissue Perfusion: Goal: Adequacy of tissue perfusion will improve Outcome: Progressing

## 2022-09-16 LAB — CBC
HCT: 32.9 % — ABNORMAL LOW (ref 39.0–52.0)
Hemoglobin: 10.6 g/dL — ABNORMAL LOW (ref 13.0–17.0)
MCH: 29.4 pg (ref 26.0–34.0)
MCHC: 32.2 g/dL (ref 30.0–36.0)
MCV: 91.4 fL (ref 80.0–100.0)
Platelets: 366 10*3/uL (ref 150–400)
RBC: 3.6 MIL/uL — ABNORMAL LOW (ref 4.22–5.81)
RDW: 15.6 % — ABNORMAL HIGH (ref 11.5–15.5)
WBC: 9.4 10*3/uL (ref 4.0–10.5)
nRBC: 0 % (ref 0.0–0.2)

## 2022-09-16 LAB — GLUCOSE, CAPILLARY
Glucose-Capillary: 133 mg/dL — ABNORMAL HIGH (ref 70–99)
Glucose-Capillary: 149 mg/dL — ABNORMAL HIGH (ref 70–99)
Glucose-Capillary: 161 mg/dL — ABNORMAL HIGH (ref 70–99)
Glucose-Capillary: 144 mg/dL — ABNORMAL HIGH (ref 70–99)
Glucose-Capillary: 141 mg/dL — ABNORMAL HIGH (ref 70–99)

## 2022-09-16 LAB — BASIC METABOLIC PANEL
Anion gap: 12 (ref 5–15)
BUN: 38 mg/dL — ABNORMAL HIGH (ref 8–23)
CO2: 26 mmol/L (ref 22–32)
Calcium: 8.8 mg/dL — ABNORMAL LOW (ref 8.9–10.3)
Chloride: 98 mmol/L (ref 98–111)
Creatinine, Ser: 0.89 mg/dL (ref 0.61–1.24)
GFR, Estimated: >60 mL/min (ref >60)
Glucose, Bld: 124 mg/dL — ABNORMAL HIGH (ref 70–99)
Potassium: 4.1 mmol/L (ref 3.5–5.1)
Sodium: 136 mmol/L (ref 135–145)

## 2022-09-16 NOTE — Progress Notes (Signed)
PROGRESS NOTE   Raymond Sampson  ZOX:[id number] DOB: 04-21-1940 DOA: 08/18/2022 PCP: Gracelyn Nurse, MD   Date of Service: the patient was seen and examined on 09/16/2022  Brief Narrative:  Raymond Sampson is a 82 y.o. male with PMH of CAD, CVA, HTN, HLD, DM-2, cervical spine fusion and OA presented to ED after fall at home and found to have traumatic right frontal SDH measuring 2.2 cm in maximum thickness with 5 to 6 mm midline shift to the left, and trace SAH at the right temporal lobe.  Patient was on Plavix and aspirin.  CT cervical spine without acute finding but some sclerotic changes at C2-C3 facet joint.  He was admitted to ICU by neurosurgery. He was initially minimally symptomatic and refused surgery.  However, he developed intermittent focal seizure.    Repeat CT head on 5/18 with stable SDH and SAH.  MRI brain showed multiple small/punctate areas of restricted diffusion with bilateral frontal and parietal cortex suspicious for tiny acute infarcts or small intraparenchymal contusion and/or shear type injury in the setting of trauma.  EEG on 5/21 showed intermittent seizure.  Neurology consulted.  Patient was started on Keppra and Vimpat.  Palliative medicine consulted as well.   Patient was transferred to hospitalist service on 5/23.  Subsequent LTM EEG without seizure.  Neurology signed off.  Encephalopathy improved but seems to be delirious.  Remains NPO except some pured diet from nursing stock.  He is on tube feed via cortrack.  SLP following.  Therapy recommended SNF. Patient remains very sleepy and lethargic, SLP unable to assess him due to sleepiness. Repeat CT head without contrast showed unchanged subdural hematoma.   Assessment and Plan: No notes have been filed under this hospital service. Service: Hospitalist  Patient is medically stable for discharge, awaiting placement  Acute metabolic encephalopathy Traumatic right subdural hematoma/subarachnoid hemorrhage with  midline shift secondary to fall and antiplatelet therapy, stable Provoked seizures Patient presented with a fall found to have a traumatic right SDH/SAH with midline shift. Repeat head CT done showed a stable hematoma.Patient assessed by neurosurgery who recommended no surgical intervention at that time. Recommendations were to hold aspirin and Plavix for a week and subsequently these have been resumed. Patient had subsequent LTM EEG with evidence of epileptogenicity arising from the right central parietal region. -Neurosurgery recommended outpatient follow-up. -Continue Vimpat 50mg  twice daily  -Amantadine 100mg  daily -Seizure precautions  -PT/OT recommended SNF: bed may be available on Monday 17th pending insurance approval  Severe protein calorie malnutrition -Continue nutritional supplementation. -Dysphagia 1 diet with honey thick liquids -Dietitian following, calorie count underway  Fall at home, left frontal laceration S/p repair with stitches. Sutures removed 08/27/2022   Back pain Likely secondary to fall. X-rays are negative for any acute findings. -Lidoderm patch, pain control, PT/OT.   Acute blood loss anemia Hb 10.6 today. Baseline 9-10. Patient with no overt bleeding. S/p 2 units PRBCs. -Continue to monitor CBC   CAD status post PCI/DES stent to LCx/OM 2 in January 2024. -Continue Coreg, Lipitor, Plavix, aspirin.    Well-controlled non-insulin-dependent diabetes mellitus seen CBGs 133-143 overnight. Last Hemoglobin A1c 6.0 (08/21/2022) -Continue Semglee 15 units daily -Continue SSI 4 times daily with meals   Hypertension BP 147/79 overnight -Continue Coreg, Norvasc.        Hypokalemia/hypophosphatemia: Resolved -Continue to monitor as needed   History of CVA -Continue Plavix, aspirin, Lipitor for secondary stroke prophylaxis.    Hyponatremia Sodium 136, improved from 134 yesterday -Continue  to monitor BMP   Dysphagia -Patient on dysphagia 1 diet thickened  pure diet with honey thick liquids..  Coughing and congestion Chest x-ray 09/10/2022  no acute infiltrate. -Robitussin as needed.  Mood disorder -Continue Zoloft.     Subjective:  Pt sleeping this morning. Wife was at bedside and pleased with his progress.   Physical Exam:  Vitals:   09/15/22 1938 09/15/22 2335 09/16/22 0631 09/16/22 0733  BP: 132/67 (!) 146/75 (!) 168/71 (!) 147/79  Pulse: 81 75 72 71  Resp: 19 19 18 19   Temp: 97.9 F (36.6 C) 98.8 F (37.1 C) (!) 97.5 F (36.4 C) 98 F (36.7 C)  TempSrc: Oral Oral Oral Oral  SpO2: 97% 99% 97% 92%  Weight:      Height:        General: sleeping, no acute distress, frail-appearing Cardio: Normal S1 and S2, RRR, no r/m/g Pulm: CTAB, normal work of breathing Abdomen: Bowel sounds normal. Abdomen soft and non-tender.  Extremities: No peripheral edema.   Data Reviewed:  I have personally reviewed and interpreted labs, imaging.   CBC: Recent Labs  Lab 09/12/22 1137 09/15/22 0425 09/16/22 0305  WBC 9.3 9.4 9.4  HGB 9.8* 10.1* 10.6*  HCT 30.5* 32.0* 32.9*  MCV 91.3 94.1 91.4  PLT 405* 379 366    Basic Metabolic Panel: Recent Labs  Lab 09/11/22 0710 09/12/22 1137 09/15/22 0425 09/16/22 0305  NA 131* 132* 134* 136  K 3.8 4.3 4.1 4.1  CL 98 99 99 98  CO2 25 25 27 26   GLUCOSE 111* 138* 183* 124*  BUN 27* 30* 40* 38*  CREATININE 0.76 0.79 0.95 0.89  CALCIUM 8.3* 8.5* 8.6* 8.8*  MG  --  2.1  --   --     GFR: Estimated Creatinine Clearance: 58.3 mL/min (by C-G formula based on SCr of 0.89 mg/dL). Liver Function Tests: No results for input(s): "AST", "ALT", "ALKPHOS", "BILITOT", "PROT", "ALBUMIN" in the last 168 hours.   Coagulation Profile: No results for input(s): "INR", "PROTIME" in the last 168 hours.   Code Status:  DNR.  Code status decision has been confirmed with: Patient Family Communication: Updated wife at bedside   Severity of Illness:  The appropriate patient status for this patient  is INPATIENT. Inpatient status is judged to be reasonable and necessary in order to provide the required intensity of service to ensure the patient's safety. The patient's presenting symptoms, physical exam findings, and initial radiographic and laboratory data in the context of their chronic comorbidities is felt to place them at high risk for further clinical deterioration. Furthermore, it is not anticipated that the patient will be medically stable for discharge from the hospital within 2 midnights of admission.   * I certify that at the point of admission it is my clinical judgment that the patient will require inpatient hospital care spanning beyond 2 midnights from the point of admission due to high intensity of service, high risk for further deterioration and high frequency of surveillance required.*   Author:  Rolm Gala MD  09/16/2022 9:19 AM

## 2022-09-17 LAB — GLUCOSE, CAPILLARY
Glucose-Capillary: 137 mg/dL — ABNORMAL HIGH (ref 70–99)
Glucose-Capillary: 141 mg/dL — ABNORMAL HIGH (ref 70–99)
Glucose-Capillary: 161 mg/dL — ABNORMAL HIGH (ref 70–99)
Glucose-Capillary: 163 mg/dL — ABNORMAL HIGH (ref 70–99)
Glucose-Capillary: 183 mg/dL — ABNORMAL HIGH (ref 70–99)
Glucose-Capillary: 192 mg/dL — ABNORMAL HIGH (ref 70–99)
Glucose-Capillary: 207 mg/dL — ABNORMAL HIGH (ref 70–99)

## 2022-09-17 NOTE — TOC Progression Note (Signed)
Transition of Care [facility]) - Progression Note    Patient Details  Name: Raymond Sampson MRN: [id number] Date of Birth: January 23, 1941  Transition [facility] CM/SW Contact  Dellie Burns [place], [place] Phone Number: 09/17/2022, 11:57 AM  Clinical Narrative: per Toni Amend with [employer], pt's request for SNF and PTAR are still under review. Courtney aware plan is for dc Monday to [facility]. SW will provide updates as available.   Dellie Burns, MSW, LCSW [phone] (coverage)        Expected Discharge Plan: Skilled Nursing Facility Barriers to Discharge: Insurance Authorization, Continued Medical Work up  Expected Discharge Plan and Services       Living arrangements for the past 2 months: Single Family Home                                       Social Determinants of Health (SDOH) Interventions SDOH Screenings   Food Insecurity: No Food Insecurity (08/29/2022)  Housing: Low Risk  (08/29/2022)  Transportation Needs: No Transportation Needs (08/29/2022)  Utilities: Not At Risk (08/29/2022)  Depression (PHQ2-9): Low Risk  (05/17/2022)  Tobacco Use: Medium Risk (08/18/2022)    Readmission Risk Interventions     No data to display

## 2022-09-17 NOTE — Progress Notes (Signed)
PROGRESS NOTE   Raymond Sampson  ZOX:[id number] DOB: Jul 09, 1940 DOA: 08/18/2022 PCP: Gracelyn Nurse, MD   Date of Service: the patient was seen and examined on 09/17/2022  Brief Narrative:  Raymond Sampson is a 82 y.o. male with PMH of CAD, CVA, HTN, HLD, DM-2, cervical spine fusion and OA presented to ED after fall at home and found to have traumatic right frontal SDH measuring 2.2 cm in maximum thickness with 5 to 6 mm midline shift to the left, and trace SAH at the right temporal lobe.  Patient was on Plavix and aspirin.  CT cervical spine without acute finding but some sclerotic changes at C2-C3 facet joint.  He was admitted to ICU by neurosurgery. He was initially minimally symptomatic and refused surgery.  However, he developed intermittent focal seizure.    Repeat CT head on 5/18 with stable SDH and SAH.  MRI brain showed multiple small/punctate areas of restricted diffusion with bilateral frontal and parietal cortex suspicious for tiny acute infarcts or small intraparenchymal contusion and/or shear type injury in the setting of trauma.  EEG on 5/21 showed intermittent seizure.  Neurology consulted.  Patient was started on Keppra and Vimpat.  Palliative medicine consulted as well.   Patient was transferred to hospitalist service on 5/23.  Subsequent LTM EEG without seizure.  Neurology signed off.  Encephalopathy improved but seems to be delirious.  Remains NPO except some pured diet from nursing stock.  He is on tube feed via cortrack.  SLP following.  Therapy recommended SNF. Patient remains very sleepy and lethargic, SLP unable to assess him due to sleepiness. Repeat CT head without contrast showed unchanged subdural hematoma.   Assessment and Plan: No notes have been filed under this hospital service. Service: Hospitalist  Patient is medically stable for discharge, awaiting placement  Acute metabolic encephalopathy Traumatic right subdural hematoma/subarachnoid hemorrhage with  midline shift secondary to fall and antiplatelet therapy, stable Provoked seizures Patient presented with a fall found to have a traumatic right SDH/SAH with midline shift. Repeat head CT done showed a stable hematoma.Patient assessed by neurosurgery who recommended no surgical intervention at that time. Recommendations were to hold aspirin and Plavix for a week and subsequently these have been resumed. Patient had subsequent LTM EEG with evidence of epileptogenicity arising from the right central parietal region. -Neurosurgery recommended outpatient follow-up. -Continue Vimpat 50mg  twice daily  -Amantadine 100mg  daily -Seizure precautions  -PT/OT recommended SNF: bed may be available on Monday 17th pending insurance approval  Severe protein calorie malnutrition -Continue nutritional supplementation. -Dysphagia 1 diet with honey thick liquids -Dietitian following, calorie count underway  Fall at home, left frontal laceration S/p repair with stitches. Sutures removed 08/27/2022   Back pain Likely secondary to fall. X-rays are negative for any acute findings. -Lidoderm patch, pain control, PT/OT.   Acute blood loss anemia Stable. Baseline 9-10. Patient with no overt bleeding. S/p 2 units PRBCs. -Continue to monitor CBC   CAD status post PCI/DES stent to LCx/OM 2 in January 2024. -Continue Coreg, Lipitor, Plavix, aspirin.    Well-controlled non-insulin-dependent diabetes mellitus seen CBGs 137overnight. Last Hemoglobin A1c 6.0 (08/21/2022) -Continue Semglee 15 units daily -Continue SSI 4 times daily with meals   Hypertension BP 125/68 overnight -Continue Coreg, Norvasc.        Hypokalemia/hypophosphatemia resolved -Continue to monitor as needed   History of CVA -Continue Plavix, aspirin, Lipitor for secondary stroke prophylaxis.    Hyponatremia Stable -Continue to monitor BMP   Dysphagia -Patient on  dysphagia 1 diet thickened pure diet with honey thick  liquids..  Coughing and congestion -Robitussin as needed.  Mood disorder -Continue Zoloft.     Subjective:  Pt sleepy today, responsive when I spoke to him. His wife is pleased with his progress. I updated her at bedside.   Physical Exam:  Vitals:   09/16/22 2001 09/16/22 2136 09/17/22 0324 09/17/22 0821  BP: 132/66 (!) 158/68 128/71 (!) 123/56  Pulse: 78 77 78 68  Resp: 18 18 18 18   Temp: 98 F (36.7 C) 98 F (36.7 C) 98.1 F (36.7 C)   TempSrc: Oral Oral Oral   SpO2: 98% 95% 96% 97%  Weight:      Height:        General: sleeping, no acute distress, frail-appearing Cardio: Normal S1 and S2, RRR, no r/m/g Pulm: CTAB, normal work of breathing Abdomen: Bowel sounds normal. Abdomen soft and non-tender.  Extremities: No peripheral edema.   Data Reviewed:  I have personally reviewed and interpreted labs, imaging.   CBC: Recent Labs  Lab 09/12/22 1137 09/15/22 0425 09/16/22 0305  WBC 9.3 9.4 9.4  HGB 9.8* 10.1* 10.6*  HCT 30.5* 32.0* 32.9*  MCV 91.3 94.1 91.4  PLT 405* 379 366    Basic Metabolic Panel: Recent Labs  Lab 09/11/22 0710 09/12/22 1137 09/15/22 0425 09/16/22 0305  NA 131* 132* 134* 136  K 3.8 4.3 4.1 4.1  CL 98 99 99 98  CO2 25 25 27 26   GLUCOSE 111* 138* 183* 124*  BUN 27* 30* 40* 38*  CREATININE 0.76 0.79 0.95 0.89  CALCIUM 8.3* 8.5* 8.6* 8.8*  MG  --  2.1  --   --     GFR: Estimated Creatinine Clearance: 58.3 mL/min (by C-G formula based on SCr of 0.89 mg/dL). Liver Function Tests: No results for input(s): "AST", "ALT", "ALKPHOS", "BILITOT", "PROT", "ALBUMIN" in the last 168 hours.   Coagulation Profile: No results for input(s): "INR", "PROTIME" in the last 168 hours.   Code Status:  DNR.  Code status decision has been confirmed with: Patient Family Communication: Updated wife at bedside   Severity of Illness:  The appropriate patient status for this patient is INPATIENT. Inpatient status is judged to be reasonable and  necessary in order to provide the required intensity of service to ensure the patient's safety. The patient's presenting symptoms, physical exam findings, and initial radiographic and laboratory data in the context of their chronic comorbidities is felt to place them at high risk for further clinical deterioration. Furthermore, it is not anticipated that the patient will be medically stable for discharge from the hospital within 2 midnights of admission.   * I certify that at the point of admission it is my clinical judgment that the patient will require inpatient hospital care spanning beyond 2 midnights from the point of admission due to high intensity of service, high risk for further deterioration and high frequency of surveillance required.*   Author:  Rolm Gala MD  09/17/2022 9:25 AM

## 2022-09-18 LAB — BASIC METABOLIC PANEL
Anion gap: 13 (ref 5–15)
BUN: 40 mg/dL — ABNORMAL HIGH (ref 8–23)
CO2: 25 mmol/L (ref 22–32)
Calcium: 8.9 mg/dL (ref 8.9–10.3)
Chloride: 100 mmol/L (ref 98–111)
Creatinine, Ser: 1.04 mg/dL (ref 0.61–1.24)
GFR, Estimated: >60 mL/min (ref >60)
Glucose, Bld: 101 mg/dL — ABNORMAL HIGH (ref 70–99)
Potassium: 4.2 mmol/L (ref 3.5–5.1)
Sodium: 138 mmol/L (ref 135–145)

## 2022-09-18 LAB — CBC
HCT: 34.1 % — ABNORMAL LOW (ref 39.0–52.0)
Hemoglobin: 10.9 g/dL — ABNORMAL LOW (ref 13.0–17.0)
MCH: 30.6 pg (ref 26.0–34.0)
MCHC: 32 g/dL (ref 30.0–36.0)
MCV: 95.8 fL (ref 80.0–100.0)
Platelets: 350 10*3/uL (ref 150–400)
RBC: 3.56 MIL/uL — ABNORMAL LOW (ref 4.22–5.81)
RDW: 15.7 % — ABNORMAL HIGH (ref 11.5–15.5)
WBC: 10.1 10*3/uL (ref 4.0–10.5)
nRBC: 0 % (ref 0.0–0.2)

## 2022-09-18 LAB — GLUCOSE, CAPILLARY
Glucose-Capillary: 111 mg/dL — ABNORMAL HIGH (ref 70–99)
Glucose-Capillary: 115 mg/dL — ABNORMAL HIGH (ref 70–99)
Glucose-Capillary: 154 mg/dL — ABNORMAL HIGH (ref 70–99)
Glucose-Capillary: 134 mg/dL — ABNORMAL HIGH (ref 70–99)
Glucose-Capillary: 222 mg/dL — ABNORMAL HIGH (ref 70–99)

## 2022-09-18 NOTE — Progress Notes (Signed)
PROGRESS NOTE   Raymond Sampson  NWG:[id number] DOB: 1940/10/15 DOA: 08/18/2022 PCP: Gracelyn Nurse, MD   Date of Service: the patient was seen and examined on 09/18/2022  Brief Narrative:  Raymond Sampson is a 82 y.o. male with PMH of CAD, CVA, HTN, HLD, DM-2, cervical spine fusion and OA presented to ED after fall at home and found to have traumatic right frontal SDH measuring 2.2 cm in maximum thickness with 5 to 6 mm midline shift to the left, and trace SAH at the right temporal lobe.  Patient was on Plavix and aspirin.  CT cervical spine without acute finding but some sclerotic changes at C2-C3 facet joint.  He was admitted to ICU by neurosurgery. He was initially minimally symptomatic and refused surgery.  However, he developed intermittent focal seizure.    Repeat CT head on 5/18 with stable SDH and SAH.  MRI brain showed multiple small/punctate areas of restricted diffusion with bilateral frontal and parietal cortex suspicious for tiny acute infarcts or small intraparenchymal contusion and/or shear type injury in the setting of trauma.  EEG on 5/21 showed intermittent seizure.  Neurology consulted.  Patient was started on Keppra and Vimpat.  Palliative medicine consulted as well.   Patient was transferred to hospitalist service on 5/23.  Subsequent LTM EEG without seizure.  Neurology signed off.  Encephalopathy improved but seems to be delirious.  Remains NPO except some pured diet from nursing stock.  He is on tube feed via cortrack.  SLP following.  Therapy recommended SNF. Patient remains very sleepy and lethargic, SLP unable to assess him due to sleepiness. Repeat CT head without contrast showed unchanged subdural hematoma.   Assessment and Plan:   Patient is medically stable for discharge, awaiting placement  Acute metabolic encephalopathy Traumatic right subdural hematoma/subarachnoid hemorrhage with midline shift secondary to fall and antiplatelet therapy, stable Provoked  seizures Patient presented with a fall found to have a traumatic right SDH/SAH with midline shift. Repeat head CT done showed a stable hematoma.Patient assessed by neurosurgery who recommended no surgical intervention at that time. Recommendations were to hold aspirin and Plavix for a week and subsequently these have been resumed. Patient had subsequent LTM EEG with evidence of epileptogenicity arising from the right central parietal region. -Continue Vimpat 50mg  twice daily  -Amantadine 100mg  daily -Seizure precautions  -PT/OT recommended SNF: bed may be available 6/17 ot 6/18 pending insurance approval -Neurosurgery recommended outpatient follow-up  Severe protein calorie malnutrition -Continue nutritional supplementation. -Dysphagia 1 diet with honey thick liquids -Dietitian following, calorie count underway  Fall at home, left frontal laceration S/p repair with stitches. Sutures removed 08/27/2022   Back pain Likely secondary to fall. X-rays are negative for any acute findings. -Lidoderm patch, pain control, PT/OT.   Acute blood loss anemia Stable. Hb 10.9. Baseline 9-10. Patient with no overt bleeding. S/p 2 units PRBCs. -Continue to monitor CBC   CAD status post PCI/DES stent to LCx/OM 2 in January 2024. -Continue Coreg, Lipitor, Plavix, aspirin.    Well-controlled non-insulin-dependent diabetes mellitus seen CBGs 115 overnight. Last Hemoglobin A1c 6.0 (08/21/2022) -Continue Semglee 15 units daily -Continue SSI 4 times daily with meals   Hypertension BP 125/68 overnight -Continue Coreg, Norvasc.        Hypokalemia/hypophosphatemia resolved -Continue to monitor as needed   History of CVA -Continue Plavix, aspirin, Lipitor for secondary stroke prophylaxis.    Hyponatremia Stable -Continue to monitor BMP   Dysphagia -Patient on dysphagia 1 diet thickened pure diet with  honey thick liquids..  Coughing and congestion -Robitussin as needed.  Mood  disorder -Continue Zoloft.     Subjective:  Pt felt a bit sick after physical therapy. Clear emesis/sputum. No other concerns.  Physical Exam:  Vitals:   09/17/22 1110 09/17/22 1943 09/18/22 0600 09/18/22 0828  BP: 125/68 (!) 140/66 127/74 137/70  Pulse: 71 80 78 72  Resp: 17 18 18 16   Temp: 98 F (36.7 C) 98.2 F (36.8 C) 98.1 F (36.7 C) 97.6 F (36.4 C)  TempSrc: Oral Oral Oral Oral  SpO2: 97% 95% 95% 92%  Weight:      Height:        General: sleeping, no acute distress, frail-appearing Cardio: Normal S1 and S2, RRR, no r/m/g Pulm: CTAB, normal work of breathing Abdomen: Bowel sounds normal. Abdomen soft and non-tender.  Extremities: No peripheral edema.   Data Reviewed:  I have personally reviewed and interpreted labs, imaging.   CBC: Recent Labs  Lab 09/12/22 1137 09/15/22 0425 09/16/22 0305 09/18/22 0341  WBC 9.3 9.4 9.4 10.1  HGB 9.8* 10.1* 10.6* 10.9*  HCT 30.5* 32.0* 32.9* 34.1*  MCV 91.3 94.1 91.4 95.8  PLT 405* 379 366 350    Basic Metabolic Panel: Recent Labs  Lab 09/12/22 1137 09/15/22 0425 09/16/22 0305 09/18/22 0341  NA 132* 134* 136 138  K 4.3 4.1 4.1 4.2  CL 99 99 98 100  CO2 25 27 26 25   GLUCOSE 138* 183* 124* 101*  BUN 30* 40* 38* 40*  CREATININE 0.79 0.95 0.89 1.04  CALCIUM 8.5* 8.6* 8.8* 8.9  MG 2.1  --   --   --     GFR: Estimated Creatinine Clearance: 49.9 mL/min (by C-G formula based on SCr of 1.04 mg/dL). Liver Function Tests: No results for input(s): "AST", "ALT", "ALKPHOS", "BILITOT", "PROT", "ALBUMIN" in the last 168 hours.   Coagulation Profile: No results for input(s): "INR", "PROTIME" in the last 168 hours.   Code Status:  DNR.  Code status decision has been confirmed with: Patient Family Communication: Updated wife at bedside   Severity of Illness:  The appropriate patient status for this patient is INPATIENT. Inpatient status is judged to be reasonable and necessary in order to provide the required  intensity of service to ensure the patient's safety. The patient's presenting symptoms, physical exam findings, and initial radiographic and laboratory data in the context of their chronic comorbidities is felt to place them at high risk for further clinical deterioration. Furthermore, it is not anticipated that the patient will be medically stable for discharge from the hospital within 2 midnights of admission.   * I certify that at the point of admission it is my clinical judgment that the patient will require inpatient hospital care spanning beyond 2 midnights from the point of admission due to high intensity of service, high risk for further deterioration and high frequency of surveillance required.*   Author:  Rolm Gala MD  09/18/2022 9:18 AM

## 2022-09-18 NOTE — Progress Notes (Signed)
Physical Therapy Treatment Patient Details Name: Raymond Sampson MRN: [id number] DOB: 1940/12/11 Today's Date: 09/18/2022   History of Present Illness 82 year old male admitted 08/18/22 after fall at home. CT head 5/18 revealed: large Right Subdural Hematoma, most pronounced along the anterior right frontal convexity.  Developed increased lethargy and change in pupilary size on 5/16 and MRI sowed multiple punctate areas of restricted diffusion in bilateral frontal and parietal cortex suspicious for tiny acute infarcts. LTM EEG positive for nonconvulsive seizures. Pt with history of CAD, prior stroke, hypertension, hyperlipidemia, type 2 diabetes, prior fall in March this year with SNF stay, chronic antiplatelet use with aspirin and Plavix.    PT Comments    Pt received in bed, awake and following basic instructional commands. Pt able to bend B knees and assist with pushing self to [facility] before sitting up. Pt able to fully extend knees in bed but not in standing, expect this is in part due to weakness and in part due to fear of falling. Pt shows gravitational insecurity with transition from supine to sit and sit to stand. Maintained posterior lean with initial sitting and was able to gradually come to midline with physical support and encouragement. Pt able to relay that he needed to have a bowel mvmt. Transferred to [facility] with max A +2 in squat pivot with knees and trunk maintained in flexion. Sit>stand to RW. with max A +2 for bowel clean up.  Unable to pivot to chair with RW so max A +2 given with therapist on front of pt to transfer [facility] to recliner. Pt relayed feeling nauseous and vomited a small amount of mucus end of session. Patient will benefit from continued inpatient follow up therapy, <3 hours/day. PT will continue to follow.   Recommendations for follow up therapy are one component of a multi-disciplinary discharge planning process, led by the attending physician.  Recommendations may be updated  based on patient status, additional functional criteria and insurance authorization.  Follow Up Recommendations  Can patient physically be transported by private vehicle: No    Assistance Recommended at Discharge Frequent or constant Supervision/Assistance  Patient can return home with the following Two people to help with walking and/or transfers;Two people to help with bathing/dressing/bathroom;Assist for transportation;Help with stairs or ramp for entrance;Direct supervision/assist for medications management;Direct supervision/assist for financial management;Assistance with cooking/housework;Assistance with feeding   Equipment Recommendations  Wheelchair (measurements PT);Wheelchair cushion (measurements PT);Hospital bed    Recommendations for Other Services       Precautions / Restrictions Precautions Precautions: Fall Precaution Comments: Seizures; buttocks wounds Restrictions Weight Bearing Restrictions: No     Mobility  Bed Mobility Overal bed mobility: Needs Assistance Bed Mobility: Supine to Sit     Supine to sit: Max assist, HOB elevated     General bed mobility comments: began moving BLEs off towards EOB with vc's. Max A for transition from SL to sit in part due to pt's fear of falling    Transfers Overall transfer level: Needs assistance Equipment used: None, Rolling walker (2 wheels) Transfers: Sit to/from Stand, Bed to chair/wheelchair/BSC Sit to Stand: +2 physical assistance, Max assist     Squat pivot transfers: Max assist, +2 physical assistance     General transfer comment: worked on sit>stand with RW but pt keeping B knees flexed and trunk fwd, seems in part due to fear of getting up. Unable to use RW to transfer. 2 person assist for transfer from EOB to [facility] and BSC to recliner. Max A +2  needed for power up and facilitation of wt shifting    Ambulation/Gait               General Gait Details: unable   Stairs             Wheelchair  Mobility    Modified Rankin (Stroke Patients Only) Modified Rankin (Stroke Patients Only) Pre-Morbid Rankin Score: Moderate disability Modified Rankin: Severe disability     Balance Overall balance assessment: Needs assistance Sitting-balance support: Single extremity supported, Feet supported, Bilateral upper extremity supported Sitting balance-Leahy Scale: Poor Sitting balance - Comments: posterior lean initially but after working on fwd reaching was able to maintain midline sitting with min guard A x5 mins Postural control: Posterior lean Standing balance support: Bilateral upper extremity supported Standing balance-Leahy Scale: Poor Standing balance comment: maintains flexed posture in standing                            Cognition Arousal/Alertness: Awake/alert Behavior During Therapy: Anxious Overall Cognitive Status: Impaired/Different from baseline Area of Impairment: Attention, Memory, Following commands, Safety/judgement, Problem solving, Awareness, Orientation                 Orientation Level: Disoriented to, Time Current Attention Level: Sustained Memory: Decreased short-term memory Following Commands: Follows one step commands inconsistently, Follows one step commands with increased time Safety/Judgement: Decreased awareness of safety, Decreased awareness of deficits Awareness: Intellectual Problem Solving: Slow processing, Decreased initiation, Difficulty sequencing, Requires verbal cues, Requires tactile cues General Comments: continues to demonstrate fear of falling when getting to EOB, standing and during transfers.        Exercises Other Exercises Other Exercises: static sitting on recliner ~5 minutes with minimal UE support and back unsupported    General Comments General comments (skin integrity, edema, etc.): wife present. Pt able to straighten knees fully in supine as well as use LE's to assist with pushing self up to [facility]. Therefore  anticipate that flexed posture in standing is in part due to weakness and in part due to fear of falling. Pt nauseous and then vomited small amout of mucus end of session, RN and MD aware.      Pertinent Vitals/Pain Pain Assessment Pain Assessment: Faces Faces Pain Scale: Hurts little more Pain Location: L hip with mobility Pain Descriptors / Indicators: Discomfort, Grimacing Pain Intervention(s): Limited activity within patient's tolerance, Monitored during session, Repositioned    Home Living                          Prior Function            PT Goals (current goals can now be found in the care plan section) Acute Rehab PT Goals Patient Stated Goal: agreeable to session PT Goal Formulation: With patient/family Time For Goal Achievement: 09/20/22 Potential to Achieve Goals: Fair Progress towards PT goals: Progressing toward goals    Frequency    Min 3X/week      PT Plan Current plan remains appropriate    Co-evaluation              AM-PAC PT "6 Clicks" Mobility   Outcome Measure  Help needed turning from your back to your side while in a flat bed without using bedrails?: A Lot Help needed moving from lying on your back to sitting on the side of a flat bed without using bedrails?: A Lot Help needed moving to and from  a bed to a chair (including a wheelchair)?: Total Help needed standing up from a chair using your arms (e.g., wheelchair or bedside chair)?: Total Help needed to walk in hospital room?: Total Help needed climbing 3-5 steps with a railing? : Total 6 Click Score: 8    End of Session Equipment Utilized During Treatment: Gait belt Activity Tolerance: Patient tolerated treatment well Patient left: in chair;with call bell/phone within reach;with chair alarm set;with family/visitor present Nurse Communication: Mobility status (use bed pad for back to bed.) PT Visit Diagnosis: Other symptoms and signs involving the nervous system  (R29.898);Muscle weakness (generalized) (M62.81);Other abnormalities of gait and mobility (R26.89);Unsteadiness on feet (R26.81);Difficulty in walking, not elsewhere classified (R26.2)     Time: 1101-1150 PT Time Calculation (min) (ACUTE ONLY): 49 min  Charges:  $Therapeutic Activity: 23-37 mins $Neuromuscular Re-education: 8-22 mins                     Lyanne Co, PT  Acute Rehab Services Secure chat preferred Office [phone]    Lawana Chambers Jeremiyah Cullens 09/18/2022, 12:54 PM

## 2022-09-18 NOTE — Progress Notes (Signed)
Daily Progress Note   Patient Name: Raymond Sampson       Date: 09/18/2022 DOB: 1941/01/02  Age: 82 y.o. MRN#: [id number] Attending Physician: Cathleen Corti, MD Primary Care Physician: Gracelyn Nurse, MD Admit Date: 08/18/2022  Reason for Consultation/Follow-up: Establishing goals of care  Subjective: Patient was asleep in NAD. Wife at bedside.  Length of Stay: 31  Current Medications: Scheduled Meds:   amantadine  100 mg Oral Daily   amLODipine  10 mg Per Tube Daily   aspirin  81 mg Per Tube Daily   atorvastatin  80 mg Per Tube Daily   carvedilol  12.5 mg Per Tube BID WC   clopidogrel  75 mg Per Tube Daily   enoxaparin (LOVENOX) injection  40 mg Subcutaneous Q24H   Gerhardt's butt cream   Topical QID   insulin aspart  0-15 Units Subcutaneous TID WC & HS   insulin glargine-yfgn  15 Units Subcutaneous Daily   lacosamide  50 mg Per Tube BID   lactose free nutrition  237 mL Oral BID BM   lidocaine  1 patch Transdermal Q24H   multivitamin with minerals  1 tablet Per Tube Daily   mouth rinse  15 mL Mouth Rinse 4 times per day   sertraline  25 mg Per Tube QPM    Continuous Infusions:  sodium chloride 10 mL/hr at 09/11/22 0226    PRN Meds: sodium chloride, acetaminophen (TYLENOL) oral liquid 160 mg/5 mL, [DISCONTINUED] acetaminophen **OR** acetaminophen, clonazepam, guaiFENesin-dextromethorphan, labetalol, [DISCONTINUED] ondansetron **OR** ondansetron (ZOFRAN) IV, mouth rinse  Physical Exam Vitals reviewed.  Constitutional:      General: He is sleeping.     Appearance: He is ill-appearing.  Pulmonary:     Effort: Pulmonary effort is normal.  Skin:    General: Skin is warm and dry.  Neurological:     Mental Status: He is easily aroused.  Psychiatric:         Behavior: Behavior normal.             Vital Signs: BP 137/70 (BP Location: Left Arm)   Pulse 72   Temp 97.6 F (36.4 C) (Oral)   Resp 16   Ht 5\' 7"  (1.702 m)   Wt 64.4 kg   SpO2 92%   BMI 22.24 kg/m  SpO2: SpO2: 92 %  O2 Device: O2 Device: Room Air O2 Flow Rate: O2 Flow Rate (L/min): 2 L/min  Intake/output summary:  Intake/Output Summary (Last 24 hours) at 09/18/2022 1055 Last data filed at 09/18/2022 0505 Gross per 24 hour  Intake 420 ml  Output --  Net 420 ml   LBM: Last BM Date : 09/18/22 Baseline Weight: Weight: 65.2 kg Most recent weight: Weight: 64.4 kg       Palliative Assessment/Data: 40%      Patient Active Problem List   Diagnosis Date Noted   Facial laceration 09/06/2022   Hyponatremia 09/06/2022   SDH (subdural hematoma) (HCC) 09/06/2022   Seizure (HCC) 09/06/2022   Hypokalemia 09/06/2022   Hypophosphatemia 09/06/2022   Cerebrovascular accident (CVA) (HCC) 09/06/2022   Dysphagia 09/06/2022   CAD S/P percutaneous coronary angioplasty 09/06/2022   Protein-calorie malnutrition, severe 08/24/2022   Provoked seizures (HCC) 08/23/2022   Encephalopathy acute 08/23/2022   DNR (do not resuscitate) 08/23/2022   Traumatic subdural hematoma without loss of consciousness (HCC) 08/18/2022   Fall 06/22/2022   Malnutrition of moderate degree 06/22/2022   Acute blood loss anemia 06/21/2022   Multiple rib fractures involving four or more ribs, right side 06/20/2022   Intramuscular hematoma 06/19/2022   Rib fractures 06/19/2022   Hemothorax on right 06/19/2022   Leukocytosis 06/19/2022   Status post coronary artery stent placement 05/02/2022   Mild dementia (HCC) 04/14/2022   Non-ST elevation (NSTEMI) myocardial infarction (HCC) 04/13/2022   History of COVID-19 04/13/2022   Lung nodule 04/13/2022   PAD (peripheral artery disease) (HCC) 04/13/2022   Flu 06/05/2017   Memory loss, short term 02/07/2016   History of CVA (cerebrovascular accident) 02/07/2016    Diabetes mellitus type 2, uncomplicated (HCC) 04/06/2015   Carotid stenosis 05/14/2013   CAROTID BRUIT, LEFT 04/25/2010   PALPITATIONS 06/18/2009   Hyperlipidemia 04/27/2009   Essential hypertension 04/27/2009   Ischemic heart disease 04/27/2009    Palliative Care Assessment & Plan   Patient Profile: 82 y.o. male with past medical history of CAD, stroke, DM2, HTN, HLD admitted on 08/18/2022 after a fall at his home with a large right-sided subdural hematoma. He was initially alert and oriented but on 08/21/22 he he became more drowsy and was only able to intermittently follow commands and intermittently answer questions. EEG showed seizure activity but he is now without seizures. SLP is seeing patient for dysphagia. RD assessing nutritional intake. Plan is to dc to SNF.  Assessment: Patient was sleeping. I met with wife at bedside. Patient's cortrak is out and he is able to meet his nutritional needs through oral intake. The patient's wife is very excited and hopeful that the patient will discharge today to SNF. We discussed potential obstacles after discharge including the need to maintain his nutritional intake and his needing to participate in PT/OT. The patient's wife continues to assist and encourage him with his needs. She notes "this is the second time in a year that the lord could have taken him" the thought makes her understandably tearful. Listened and provided support.   Encouraged patient's wife to call PMT with additional questions or concerns.   Recommendations/Plan: DNR Patient has stated in the past he does not want a PEG tube.  Allow time for outcomes. Continued PMT support. Encourage family to continue conversations around GOC  Discharge to SNF  Goals of Care and Additional Recommendations: Limitations on Scope of Treatment: Use MOST form for guidance  Code Status:    Code Status Orders  (From admission, onward)  Start     Ordered   08/19/22 0022  Do  not attempt resuscitation (DNR)  Continuous       Question Answer Comment  If patient has no pulse and is not breathing Do Not Attempt Resuscitation   If patient has a pulse and/or is breathing: Medical Treatment Goals COMFORT MEASURES: Keep clean/warm/dry, use medication by any route; positioning, wound care and other measures to relieve pain/suffering; use oxygen, suction/manual treatment of airway obstruction for comfort; do not transfer unless for comfort needs.   Consent: Discussion documented in EHR or advanced directives reviewed      08/19/22 0023               Thank you for allowing the Palliative Medicine Team to assist in the care of this patient.  Time spent: 50 minutes   Detailed review of medical records ( labs, imaging, vital signs), medically appropriate exam, counseling and education to care partner, documenting clinical information, medication management, coordination of care.   Sherryll Burger, NP  Please contact Palliative Medicine Team phone at [phone] for questions and concerns.

## 2022-09-19 DIAGNOSIS — R569 Unspecified convulsions: Secondary | ICD-10-CM | POA: Diagnosis not present

## 2022-09-19 DIAGNOSIS — R9431 Abnormal electrocardiogram [ECG] [EKG]: Secondary | ICD-10-CM | POA: Diagnosis not present

## 2022-09-19 DIAGNOSIS — S065XAA Traumatic subdural hemorrhage with loss of consciousness status unknown, initial encounter: Secondary | ICD-10-CM | POA: Diagnosis not present

## 2022-09-19 DIAGNOSIS — S0181XA Laceration without foreign body of other part of head, initial encounter: Secondary | ICD-10-CM | POA: Diagnosis not present

## 2022-09-19 LAB — GLUCOSE, CAPILLARY
Glucose-Capillary: 114 mg/dL — ABNORMAL HIGH (ref 70–99)
Glucose-Capillary: 118 mg/dL — ABNORMAL HIGH (ref 70–99)
Glucose-Capillary: 179 mg/dL — ABNORMAL HIGH (ref 70–99)

## 2022-09-19 NOTE — Progress Notes (Signed)
PROGRESS NOTE   Raymond Sampson  ZOX:[id number] DOB: [birth year] DOA: 08/18/2022 PCP: Gracelyn Nurse, MD   Date of Service: the patient was seen and examined on 09/19/2022  Brief Narrative:  Raymond Sampson is a 82 y.o. male who is ambulatory at baseline with PMH of CAD, CVA, HTN, HLD, DM-2, cervical spine fusion and OA presented to ED after fall at home and found to have traumatic right frontal SDH measuring 2.2 cm in maximum thickness with 5 to 6 mm midline shift to the left, and trace SAH at the right temporal lobe.  Patient was on Plavix and aspirin.  CT cervical spine without acute finding but some sclerotic changes at C2-C3 facet joint.  He was admitted to ICU by neurosurgery. He was initially minimally symptomatic and refused surgery.  However, he developed intermittent focal seizure.    Repeat CT head on 5/18 with stable SDH and SAH.  MRI brain showed multiple small/punctate areas of restricted diffusion with bilateral frontal and parietal cortex suspicious for tiny acute infarcts or small intraparenchymal contusion and/or shear type injury in the setting of trauma.  EEG on 5/21 showed intermittent seizure.  Neurology consulted.  Patient was started on Keppra and Vimpat.  Palliative medicine consulted as well.   Patient was transferred to hospitalist service on 5/23.  Subsequent LTM EEG without seizure. Repeat CT head without contrast showed unchanged subdural hematoma. Neurology signed off.  He is on tube feed via cortrack.  SLP following.  Therapy recommended SNF, insurance denied and delayed discharge.  6/18: cortrack is out and patient is able to eat with assistance feeding him. He is alert but sedated. Able to respond minimally to questions asked of him and follow simple commands.   Assessment and Plan:  Patient is medically stable for discharge, awaiting placement. Insurance denial is being appealed currently. TOC is engaged.  Acute metabolic encephalopathy Traumatic right  subdural hematoma/subarachnoid hemorrhage with midline shift secondary to fall and antiplatelet therapy, stable Provoked seizures Patient presented with a fall found to have a traumatic right SDH/SAH with midline shift. Repeat head CT done showed a stable hematoma.Patient assessed by neurosurgery who recommended no surgical intervention at that time. Recommendations were to hold aspirin and Plavix for a week and subsequently these have been resumed. Patient had subsequent LTM EEG with evidence of epileptogenicity arising from the right central parietal region. -Continue Vimpat 50mg  twice daily  -Amantadine 100mg  daily -Seizure precautions  -PT/OT recommended SNF -Neurosurgery recommended outpatient follow-up  Severe protein calorie malnutrition  Dysphagia -Continue nutritional supplementation. -Dysphagia 1 diet with honey thick liquids -Dietitian following, calorie count underway  Fall at home, left frontal laceration S/p repair with stitches. Sutures removed 08/27/2022   Back pain-  X-rays are negative for any acute findings. No complaints today.  -Lidoderm patch, pain control, PT/OT.   Acute blood loss anemia Stable. Hb 10.9. Baseline 9-10. Patient with no overt bleeding. S/p 2 units PRBCs. -Continue to monitor CBC   CAD status post PCI/DES stent to LCx/OM 2 in January 2024. -Continue Coreg, Lipitor, Plavix, aspirin.    Well-controlled non-insulin-dependent diabetes mellitus- Hgb A1c 6.0 (08/21/2022) -discontinue insulin   Hypertension- well controlled -Continue Coreg, Norvasc.        Hypokalemia  hypophosphatemia  hyponatremia- resolved -Continue to monitor as needed   History of CVA -Continue Plavix, aspirin, Lipitor for secondary stroke prophylaxis.   Coughing and congestion -Robitussin as needed.  Mood disorder -Continue Zoloft.     Subjective: Pt reports feeling well.  He has no complaints. Wife at bedside thinks he is doing well today and more alert than he has  been. She is appealing the insurance denial. States he was ambulatory and fed himself prior to admission and now is dependent on care 24/7.   Physical Exam:  Vitals:   09/19/22 0012 09/19/22 0421 09/19/22 0500 09/19/22 0739  BP: 133/78 (!) 159/66  (!) 157/70  Pulse: 77 66  64  Resp: 18 17  14   Temp: 98.8 F (37.1 C) 97.8 F (36.6 C)  97.8 F (36.6 C)  TempSrc: Oral Oral  Oral  SpO2: 98% 95%  94%  Weight:   64.9 kg   Height:        General: sleeping, no acute distress, frail-appearing Cardio: Normal S1 and S2, RRR, no r/m/g Pulm: CTAB, normal work of breathing Abdomen: Bowel sounds normal. Abdomen soft and non-tender.  Extremities: No peripheral edema.   Data Reviewed:  I have personally reviewed and interpreted labs, imaging.   CBC: Recent Labs  Lab 09/12/22 1137 09/15/22 0425 09/16/22 0305 09/18/22 0341  WBC 9.3 9.4 9.4 10.1  HGB 9.8* 10.1* 10.6* 10.9*  HCT 30.5* 32.0* 32.9* 34.1*  MCV 91.3 94.1 91.4 95.8  PLT 405* 379 366 350    Basic Metabolic Panel: Recent Labs  Lab 09/12/22 1137 09/15/22 0425 09/16/22 0305 09/18/22 0341  NA 132* 134* 136 138  K 4.3 4.1 4.1 4.2  CL 99 99 98 100  CO2 25 27 26 25   GLUCOSE 138* 183* 124* 101*  BUN 30* 40* 38* 40*  CREATININE 0.79 0.95 0.89 1.04  CALCIUM 8.5* 8.6* 8.8* 8.9  MG 2.1  --   --   --      Code Status:  DNR.  Code status decision has been confirmed with: Patient Family Communication: Updated wife at bedside   Severity of Illness:  The appropriate patient status for this patient is INPATIENT. Inpatient status is judged to be reasonable and necessary in order to provide the required intensity of service to ensure the patient's safety. The patient's presenting symptoms, physical exam findings, and initial radiographic and laboratory data in the context of their chronic comorbidities is felt to place them at high risk for further clinical deterioration. Furthermore, it is not anticipated that the patient will  be medically stable for discharge from the hospital within 2 midnights of admission.   * I certify that at the point of admission it is my clinical judgment that the patient will require inpatient hospital care spanning beyond 2 midnights from the point of admission due to high intensity of service, high risk for further deterioration and high frequency of surveillance required.*   Author:  Leeroy Bock MD  09/19/2022 7:50 AM

## 2022-09-19 NOTE — Progress Notes (Signed)
Occupational Therapy Treatment Patient Details Name: Raymond Sampson MRN: [id number] DOB: [birth year] Today's Date: 09/19/2022   History of present illness 82 year old male admitted 08/18/22 after fall at home. CT head 5/18 revealed: large Right Subdural Hematoma, most pronounced along the anterior right frontal convexity.  Developed increased lethargy and change in pupilary size on 5/16 and MRI sowed multiple punctate areas of restricted diffusion in bilateral frontal and parietal cortex suspicious for tiny acute infarcts. LTM EEG positive for nonconvulsive seizures. Pt with history of CAD, prior stroke, hypertension, hyperlipidemia, type 2 diabetes, prior fall in March this year with SNF stay, chronic antiplatelet use with aspirin and Plavix.   OT comments  This 82 yo male admitted with above seen today to see how he followed commands (at least 75% of time, sometimes needing more cues), bed mobility (max A), balance EOB (Max-min guard A), attempted to stand with +1 (unable, too much of a posterior lean), and grooming (thorough with face washing without cues). He is making progress towards his goals with goals updated this session. He has the potential to definitely do more for himself and will be able to do more the more he is OOB and his body/brain acclimates to being upright for longer periods of time which would be accomplished by continued rehab at inpatient follow up therapy, <3 hours/day.   Recommendations for follow up therapy are one component of a multi-disciplinary discharge planning process, led by the attending physician.  Recommendations may be updated based on patient status, additional functional criteria and insurance authorization.    Assistance Recommended at Discharge Frequent or constant Supervision/Assistance  Patient can return home with the following  Two people to help with walking and/or transfers;Two people to help with bathing/dressing/bathroom;Help with stairs or ramp for  entrance;Assist for transportation;Direct supervision/assist for financial management;Assistance with feeding;Direct supervision/assist for medications management;Assistance with cooking/housework   Equipment Recommendations  Other (comment) (TBD next venue)       Precautions / Restrictions Precautions Precautions: Fall Precaution Comments: Seizures; buttocks wounds Restrictions Weight Bearing Restrictions: No       Mobility Bed Mobility Overal bed mobility: Needs Assistance Bed Mobility: Supine to Sit     Supine to sit: Max assist, HOB elevated     General bed mobility comments: Pt able to get both legs from middle of the bed to the edge of the bed on the right hand side once I asked him to do so. Then I assisted him with getting his legs off the bed and trunk up    Transfers Overall transfer level: Needs assistance                 General transfer comment: Attempted sit<>stand with +1 A from EOB with raised bed but pt with too much of a posterior lean due to fear of falling.     Balance Overall balance assessment: Needs assistance Sitting-balance support: Bilateral upper extremity supported, Feet supported, No upper extremity supported (Pt initially when up to sit EOB he had a right and posterior lean. Helped him to lean down laterally on left forearm for ~1 minute and then got himself back up to sit at midline and was able to maintain this with and without UE support for ~5 minutes). Pt able to do this 2 x--he is tenative to come down on left forearm due to he feels he is falling--but he did do it.   Sitting balance - Comments: varied between zero and fair  ADL either performed or assessed with clinical judgement   ADL Overall ADL's : Needs assistance/impaired     Grooming: Wash/dry face;Set up;Supervision/safety Grooming Details (indicate cue type and reason): in bed with HOB up using both hands to bring washcloth  to his face then switching off between hands to be through with washing his face                                    Extremity/Trunk Assessment Upper Extremity Assessment RUE Deficits / Details: Pt able to raise and hold arm up in the air while supine at ~45 degree angle without A, can grab my hand with 4/5 strength while seated EOB LUE Deficits / Details: Pt able to raise and hold arm up in the air while supine at ~45 degree angle without A, can grab my hand with 4/5 strength while seated EOB   Lower Extremity Assessment Lower Extremity Assessment:  (moved both legs to the right hand side of the bed when I asked him to (I then had to A them off the bed))        Vision Patient Visual Report: No change from baseline            Cognition Arousal/Alertness: Awake/alert Behavior During Therapy: Anxious (when up and moving (fear of falling)) Overall Cognitive Status: Impaired/Different from baseline Area of Impairment: Orientation, Attention, Following commands, Safety/judgement, Problem solving, Awareness                 Orientation Level: Place, Time (could not tell me place, year he could not say until I said "202_" then he said 2024,month he said May and I said nope its the one after that and he said June) Current Attention Level: Sustained   Following Commands: Follows one step commands with increased time, Follows one step commands inconsistently (sometimes commands have to be repeated--but he did follow or tried to follow all I asked of him today) Safety/Judgement: Decreased awareness of safety, Decreased awareness of deficits   Problem Solving: Slow processing, Decreased initiation, Difficulty sequencing, Requires verbal cues, Requires tactile cues                     Pertinent Vitals/ Pain       Pain Assessment Pain Assessment: No/denies pain         Frequency  Min 2X/week        Progress Toward Goals  OT Goals(current goals can now be  found in the care plan section)  Progress towards OT goals: Progressing toward goals (goals updated this session)  Acute Rehab OT Goals Patient Stated Goal: agreed to sitting up on EOB and working on balance for ADLs OT Goal Formulation: With patient/family (dtr came into room right as I started to work with him) Time For Goal Achievement: 10/03/22 Potential to Achieve Goals: Good  Plan Discharge plan remains appropriate       AM-PAC OT "6 Clicks" Daily Activity     Outcome Measure   Help from another person eating meals?: A Lot Help from another person taking care of personal grooming?: A Lot Help from another person toileting, which includes using toliet, bedpan, or urinal?: Total Help from another person bathing (including washing, rinsing, drying)?: A Lot Help from another person to put on and taking off regular upper body clothing?: A Lot Help from another person to put on and taking off regular lower  body clothing?: Total 6 Click Score: 10    End of Session Equipment Utilized During Treatment: Gait belt  OT Visit Diagnosis: Other abnormalities of gait and mobility (R26.89);Muscle weakness (generalized) (M62.81);Other symptoms and signs involving cognitive function;Other symptoms and signs involving the nervous system (R29.898)   Activity Tolerance Patient tolerated treatment well   Patient Left in bed;with call bell/phone within reach;with family/visitor present;with bed alarm set           Time: 1610-9604 OT Time Calculation (min): 24 min  Charges: OT General Charges $OT Visit: 1 Visit OT Treatments $Therapeutic Activity: 23-37 mins  [name] OT Acute Rehabilitation Services Office [phone]    [name] 09/19/2022, 1:49 PM

## 2022-09-19 NOTE — TOC Progression Note (Signed)
Transition of Care [facility]) - Progression Note    Patient Details  Name: Raymond Sampson MRN: [id number] Date of Birth: 05-09-1940  Transition of Care [facility]) CM/SW Contact  Baldemar Lenis, [place] Phone Number: 09/19/2022, 3:42 PM  Clinical Narrative:   CSW contacted [employer] multiple times throughout the day to initiate appeal of SNF denial. CSW was able to initiate fast appeal and sent in medical records to justify request. Awaiting response from [employer]. CSW to follow.    Expected Discharge Plan: Skilled Nursing Facility Barriers to Discharge: English as a second language teacher, Continued Medical Work up  Expected Discharge Plan and Services       Living arrangements for the past 2 months: Single Family Home                                       Social Determinants of Health (SDOH) Interventions SDOH Screenings   Food Insecurity: No Food Insecurity (08/29/2022)  Housing: Low Risk  (08/29/2022)  Transportation Needs: No Transportation Needs (08/29/2022)  Utilities: Not At Risk (08/29/2022)  Depression (PHQ2-9): Low Risk  (05/17/2022)  Tobacco Use: Medium Risk (08/18/2022)    Readmission Risk Interventions     No data to display

## 2022-09-19 NOTE — TOC Progression Note (Signed)
Transition of Care [facility]) - Progression Note    Patient Details  Name: Raymond Sampson MRN: [id number] Date of Birth: 02-20-1941  Transition of Care [facility]) CM/SW Contact  Baldemar Lenis, [place] Phone Number: 09/19/2022, 10:39 AM  Clinical Narrative:   CSW received call yesterday evening from patient's insurance offering peer to peer with an hour deadline; call was not received until this morning as CSW was not working. CSW contacted [employer] on call this morning to ask about peer to peer request, and patient's SNF authorization was denied as peer to peer was not completed. CSW complained to on call and was told it would be investigated. CSW spoke with [facility] to update, and complaint was sent to [facility] medical director. CSW received denial from [employer] with instructions for completing appeal. CSW updated patient's spouse, Raymond Sampson, who would like to move forward with appeal. CSW updated MD and completed paperwork for fast appeal. CSW obtained signature from wife to represent the patient for the fast appeal, and compiled medical records to send in for appeal. CSW to send appeal in for SNF authorization.    Expected Discharge Plan: Skilled Nursing Facility Barriers to Discharge: English as a second language teacher, Continued Medical Work up  Expected Discharge Plan and Services       Living arrangements for the past 2 months: Single Family Home                                       Social Determinants of Health (SDOH) Interventions SDOH Screenings   Food Insecurity: No Food Insecurity (08/29/2022)  Housing: Low Risk  (08/29/2022)  Transportation Needs: No Transportation Needs (08/29/2022)  Utilities: Not At Risk (08/29/2022)  Depression (PHQ2-9): Low Risk  (05/17/2022)  Tobacco Use: Medium Risk (08/18/2022)    Readmission Risk Interventions     No data to display

## 2022-09-19 NOTE — Plan of Care (Signed)
  Problem: Clinical Measurements: Goal: Respiratory complications will improve Outcome: Progressing   Problem: Coping: Goal: Level of anxiety will decrease Outcome: Progressing   Problem: Health Behavior/Discharge Planning: Goal: Ability to manage health-related needs will improve Outcome: Not Progressing   Problem: Activity: Goal: Risk for activity intolerance will decrease Outcome: Not Progressing

## 2022-09-20 ENCOUNTER — Inpatient Hospital Stay (HOSPITAL_COMMUNITY): Payer: PPO

## 2022-09-20 DIAGNOSIS — S065X0D Traumatic subdural hemorrhage without loss of consciousness, subsequent encounter: Secondary | ICD-10-CM | POA: Diagnosis not present

## 2022-09-20 LAB — CBC
HCT: 37.4 % — ABNORMAL LOW (ref 39.0–52.0)
Hemoglobin: 11.9 g/dL — ABNORMAL LOW (ref 13.0–17.0)
MCH: 29.8 pg (ref 26.0–34.0)
MCHC: 31.8 g/dL (ref 30.0–36.0)
MCV: 93.7 fL (ref 80.0–100.0)
Platelets: 361 10*3/uL (ref 150–400)
RBC: 3.99 MIL/uL — ABNORMAL LOW (ref 4.22–5.81)
RDW: 15.6 % — ABNORMAL HIGH (ref 11.5–15.5)
WBC: 16.3 10*3/uL — ABNORMAL HIGH (ref 4.0–10.5)
nRBC: 0 % (ref 0.0–0.2)

## 2022-09-20 LAB — BASIC METABOLIC PANEL
Anion gap: 11 (ref 5–15)
BUN: 43 mg/dL — ABNORMAL HIGH (ref 8–23)
CO2: 26 mmol/L (ref 22–32)
Calcium: 9.1 mg/dL (ref 8.9–10.3)
Chloride: 100 mmol/L (ref 98–111)
Creatinine, Ser: 1.04 mg/dL (ref 0.61–1.24)
GFR, Estimated: >60 mL/min (ref >60)
Glucose, Bld: 120 mg/dL — ABNORMAL HIGH (ref 70–99)
Potassium: 4.1 mmol/L (ref 3.5–5.1)
Sodium: 137 mmol/L (ref 135–145)

## 2022-09-20 MED ORDER — SODIUM CHLORIDE 0.9 % IV SOLN
3.0000 g | Freq: Four times a day (QID) | INTRAVENOUS | Status: DC
  Administered 2022-09-20 – 2022-09-22 (×8): 3 g via INTRAVENOUS
  Filled 2022-09-20 (×8): qty 8

## 2022-09-20 NOTE — Progress Notes (Signed)
Modified Barium Swallow Study  Patient Details  Name: Raymond Sampson MRN: [id number] Date of Birth: January 05, 1941  Today's Date: 09/20/2022  Modified Barium Swallow completed.  Full report located under Chart Review in the Imaging Section.  History of Present Illness 82 year old male who presented for fall. Hit forehead with laceration repair, no LOC, no facial fx, but CT 5/18 revealed: "stable large, approximately 17 mm thick Right Subdural Hematoma, most pronounced along the anterior right frontal convexity. Stable small volume right hemisphere [facility]." Chest CT 5/17: "Emphysema. Right pleural effusion. Hazy upper lobe airspace disease is indeterminate for edema or pneumonia." Neuro change noted by RN around 1300 on 5/19 with workup revealing seizures, now controlled with medication. Pt with history of CAD, prior stroke, hypertension, hyperlipidemia, type 2 diabetes, chronic antiplatelet use with aspirin and Plavix. MBS revealed hardware from ACDF. Cortrak placed 5/22;ST f/u for dysphagia tx/management. Repeat MBS recommended initiating a Dysphagia 1(puree)/Honey-thickened liquid diet.  ST f/u for dysphagia tx/management/education.   Clinical Impression Pt presents with ongoing oropharyngeal dysphagia with diffuse deficits related to oral control, timing of swallow onset, reduced pharyngeal squeeze and base-of tongue contraction.  Variability of swallow function is great, ranging from adequate airway protection with small sips of thin liquid to gross aspiration of same consistency.  Vallecular residue is severe with poor ability to clear with subsequent dry swallows.  Over time, all administered POs eventually spilled in trace amounts to the level of the vocal folds. Cough response to aspiration was inconsistent.  Postural adjustments were ineffective. After the study, imaging was shared with Raymond Sampson and we had a long discussion about his slow progress and the large number of risk factors he has that  may lead to an aspiration pna. The variability of his swallow function obscures an easy solution to his dysphagia. We agreed to continue dysphagia 1, honey thick liquids for now; allow sips of thin water between meals and after oral care to help meet hydration needs.  Raymond Sampson recognizes that if her husband's quality of life is compromised by limiting his food/drink opportunities, she may decide to shift to comfort POs in the future. She knows she has access to palliative care if needed.  We discussed this plan with Dr. Mahala Menghini.  SLP will continue to follow while pt is admitted.   Factors that may increase risk of adverse event in presence of aspiration [name] 2021): Reduced cognitive function;Limited mobility;Frail or deconditioned;Dependence for feeding and/or oral hygiene;Weak cough;Aspiration of thick, dense, and/or acidic materials  Swallow Evaluation Recommendations Recommendations: PO diet PO Diet Recommendation: Dysphagia 1 (Pureed);Moderately thick liquids (Level 3, honey thick) Liquid Administration via: Spoon Medication Administration: Crushed with puree Supervision: Full assist for feeding Swallowing strategies  : Slow rate;Multiple dry swallows after each bite/sip Postural changes: Position pt fully upright for meals Oral care recommendations: Oral care BID (2x/day)   Nikan Ellingson [name] [name], MA CCC/SLP Clinical Specialist - Acute Care SLP Acute Rehabilitation Services Office number [phone]    [name] [name] 09/20/2022,12:51 PM

## 2022-09-20 NOTE — Progress Notes (Signed)
Physical Therapy Treatment Patient Details Name: Raymond Sampson MRN: [id number] DOB: 1940/05/12 Today's Date: 09/20/2022   History of Present Illness 82 year old male admitted 08/18/22 after fall at home. CT head 5/18 revealed: large Right Subdural Hematoma, most pronounced along the anterior right frontal convexity.  Developed increased lethargy and change in pupilary size on 5/16 and MRI sowed multiple punctate areas of restricted diffusion in bilateral frontal and parietal cortex suspicious for tiny acute infarcts. LTM EEG positive for nonconvulsive seizures. Pt with history of CAD, prior stroke, hypertension, hyperlipidemia, type 2 diabetes, prior fall in March this year with SNF stay, chronic antiplatelet use with aspirin and Plavix.    PT Comments    Pt seen for PT tx with wife present & actively assisting during session. Pt requires max assist for supine>sit with cuing but poor ability to follow commands for use of bed rails, +2 for sit>supine. Pt requires max assist to sit EOB & cuing for hand placement to correct posterior lean. Pt with decreased engagement/responsiveness while sitting EOB, R pupil larger than L, pt c/o feeling unwell. Once returned supine pt with c/o nausea. HR 120 bpm, SpO2 94% on room air, BP in RUE 154/85 mmHg MAP 105. Nurse called to room to assess pt. Will continue to follow pt acutely to progress mobility as able.    Recommendations for follow up therapy are one component of a multi-disciplinary discharge planning process, led by the attending physician.  Recommendations may be updated based on patient status, additional functional criteria and insurance authorization.  Follow Up Recommendations  Can patient physically be transported by private vehicle: No    Assistance Recommended at Discharge Frequent or constant Supervision/Assistance  Patient can return home with the following Two people to help with walking and/or transfers;Two people to help with  bathing/dressing/bathroom;Assist for transportation;Help with stairs or ramp for entrance;Direct supervision/assist for medications management;Direct supervision/assist for financial management;Assistance with cooking/housework;Assistance with feeding   Equipment Recommendations  Wheelchair (measurements PT);Wheelchair cushion (measurements PT);Hospital bed    Recommendations for Other Services       Precautions / Restrictions Precautions Precautions: Fall Precaution Comments: Seizures; buttocks wounds Restrictions Weight Bearing Restrictions: No     Mobility  Bed Mobility Overal bed mobility: Needs Assistance Bed Mobility: Supine to Sit   Sidelying to sit: Max assist   Sit to supine: Total assist, +2 for physical assistance, Max assist   General bed mobility comments: wife provides +2 assist to scoot to [facility]    Transfers                        Ambulation/Gait                   Stairs             Wheelchair Mobility    Modified Rankin (Stroke Patients Only)       Balance Overall balance assessment: Needs assistance Sitting-balance support: Bilateral upper extremity supported, Feet supported, No upper extremity supported Sitting balance-Leahy Scale: Poor                                      Cognition Arousal/Alertness: Awake/alert Behavior During Therapy: Flat affect Overall Cognitive Status: Impaired/Different from baseline Area of Impairment: Attention, Following commands, Safety/judgement, Problem solving, Awareness, Memory, Orientation  Memory: Decreased short-term memory Following Commands: Follows one step commands with increased time, Follows one step commands inconsistently Safety/Judgement: Decreased awareness of safety, Decreased awareness of deficits Awareness: Intellectual Problem Solving: Slow processing, Decreased initiation, Difficulty sequencing, Requires verbal cues, Requires  tactile cues General Comments: Fear of falling which continues to impact mobility.        Exercises      General Comments        Pertinent Vitals/Pain Pain Assessment Pain Assessment: No/denies pain    Home Living                          Prior Function            PT Goals (current goals can now be found in the care plan section) Acute Rehab PT Goals Patient Stated Goal: to get better PT Goal Formulation: With patient/family Time For Goal Achievement: 10/04/22 Potential to Achieve Goals: Fair Progress towards PT goals: Progressing toward goals (goals reviewed & updated as appropriate, please see plan of care)    Frequency    Min 3X/week      PT Plan Current plan remains appropriate    Co-evaluation              AM-PAC PT "6 Clicks" Mobility   Outcome Measure  Help needed turning from your back to your side while in a flat bed without using bedrails?: A Lot Help needed moving from lying on your back to sitting on the side of a flat bed without using bedrails?: A Lot Help needed moving to and from a bed to a chair (including a wheelchair)?: Total Help needed standing up from a chair using your arms (e.g., wheelchair or bedside chair)?: Total Help needed to walk in hospital room?: Total Help needed climbing 3-5 steps with a railing? : Total 6 Click Score: 8    End of Session   Activity Tolerance: Treatment limited secondary to medical complications (Comment) (limited 2/2 nausea) Patient left: in bed;with call bell/phone within reach;with family/visitor present Nurse Communication:  (events of session (pt with decreased response time once sitting EOB, R pupil larger than L, returned to supine & pt reports nausea)) PT Visit Diagnosis: Other symptoms and signs involving the nervous system (R29.898);Muscle weakness (generalized) (M62.81);Other abnormalities of gait and mobility (R26.89);Unsteadiness on feet (R26.81);Difficulty in walking, not  elsewhere classified (R26.2)     Time: 1610-9604 PT Time Calculation (min) (ACUTE ONLY): 16 min  Charges:  $Therapeutic Activity: 8-22 mins                     Aleda Grana, PT, DPT 09/20/22, 1:02 PM   Sandi Mariscal 09/20/2022, 1:01 PM

## 2022-09-20 NOTE — Progress Notes (Signed)
Speech Language Pathology Treatment: Dysphagia  Patient Details Name: Raymond Sampson MRN: [id number] DOB: [birth year] Today's Date: 09/20/2022 Time: 8295-6213 SLP Time Calculation (min) (ACUTE ONLY): 24 min  Assessment / Plan / Recommendation Clinical Impression  Pt has made slow progress toward swallow recovery. He is tolerating dysphagia 1, honey thick liquids with 1:1 assistance and cues needed for multiple strategies. He no longer has NG for supplemental feeding. His wife was at the bedside. We discussed the benefit of repeating an MBS prior to D/C to determine if liquids can be liberalized from honey.  We discussed that his overall risk for aspiration and its adverse consequences remains high, and while we can work to modify diet and implement feeding strategies, it is very difficult to prevent potential aspiration pna due to his multiple risk factors (limited mobility, deconditioned state, reduced cognitive function, weak cough, dependence for feeding, and aspiration of thick/dense material - Rubye Oaks and Clearance Coots 2021).  Mrs Haslem verbalized understanding.  Plan to repeat MBS today. D/W Dr. Mahala Menghini.    HPI HPI: 82 year old male who presented for fall. Hit forehead with laceration repair, no LOC, no facial fx, but CT 5/18 revealed: "stable large, approximately 17 mm thick Right Subdural Hematoma, most pronounced along the anterior right frontal convexity. Stable small volume right hemisphere [facility]." Chest CT 5/17: "Emphysema. Right pleural effusion. Hazy upper lobe airspace disease is indeterminate for edema or pneumonia." Neuro change noted by RN around 1300 on 5/19 with workup revealing seizures, now controlled with medication. Pt with history of CAD, prior stroke, hypertension, hyperlipidemia, type 2 diabetes, chronic antiplatelet use with aspirin and Plavix. MBS revealed hardware from ACDF. Cortrak placed 5/22;ST f/u for dysphagia tx/management. Repeat MBS recommended initiating a Dysphagia  1(puree)/Honey-thickened liquid diet.  ST f/u for dysphagia tx/management/education.      SLP Plan  MBS      Recommendations for follow up therapy are one component of a multi-disciplinary discharge planning process, led by the attending physician.  Recommendations may be updated based on patient status, additional functional criteria and insurance authorization.    Recommendations  Diet recommendations: Dysphagia 1 (puree);Honey-thick liquid Liquids provided via: Teaspoon Medication Administration: Crushed with puree Supervision: Full supervision/cueing for compensatory strategies;Trained caregiver to feed patient Compensations: Slow rate;Small sips/bites;Multiple dry swallows after each bite/sip;Follow solids with liquid;Clear throat intermittently Postural Changes and/or Swallow Maneuvers: Seated upright 90 degrees                  Oral care BID;Staff/trained caregiver to provide oral care   Frequent or constant Supervision/Assistance Dysphagia, oropharyngeal phase (R13.12)     MBS    Summer Parthasarathy [name] [name], MA CCC/SLP Clinical Specialist - Acute Care SLP Acute Rehabilitation Services Office number [phone]  [name] [name]  09/20/2022, 11:41 AM

## 2022-09-20 NOTE — Plan of Care (Signed)
  Problem: Nutrition: Goal: Adequate nutrition will be maintained Outcome: Progressing   Problem: Elimination: Goal: Will not experience complications related to bowel motility Outcome: Progressing Goal: Will not experience complications related to urinary retention Outcome: Progressing   Problem: Health Behavior/Discharge Planning: Goal: Ability to manage health-related needs will improve Outcome: Not Progressing   Problem: Clinical Measurements: Goal: Respiratory complications will improve Outcome: Not Progressing   Problem: Activity: Goal: Risk for activity intolerance will decrease Outcome: Not Progressing   Problem: Coping: Goal: Level of anxiety will decrease Outcome: Not Progressing   Problem: Nutritional: Goal: Progress toward achieving an optimal weight will improve Outcome: Not Progressing

## 2022-09-20 NOTE — Progress Notes (Signed)
Daily Progress Note   Patient Name: Raymond Sampson       Date: 09/20/2022 DOB: 1941-03-01  Age: 82 y.o. MRN#: [id number] Attending Physician: Rhetta Mura, MD Primary Care Physician: Gracelyn Nurse, MD Admit Date: 08/18/2022  Reason for Consultation/Follow-up: Establishing goals of care  Subjective: Patient sleeping but easily arouses. Appears comfortable. Wife and daughter-in-law    Length of Stay: 42  Current Medications: Scheduled Meds:   amantadine  100 mg Oral Daily   amLODipine  10 mg Per Tube Daily   aspirin  81 mg Per Tube Daily   atorvastatin  80 mg Per Tube Daily   carvedilol  12.5 mg Per Tube BID WC   clopidogrel  75 mg Per Tube Daily   enoxaparin (LOVENOX) injection  40 mg Subcutaneous Q24H   Gerhardt's butt cream   Topical QID   lacosamide  50 mg Per Tube BID   lactose free nutrition  237 mL Oral BID BM   lidocaine  1 patch Transdermal Q24H   multivitamin with minerals  1 tablet Per Tube Daily   mouth rinse  15 mL Mouth Rinse 4 times per day   sertraline  25 mg Per Tube QPM    PRN Meds: sodium chloride, acetaminophen (TYLENOL) oral liquid 160 mg/5 mL, [DISCONTINUED] acetaminophen **OR** acetaminophen, clonazepam, guaiFENesin-dextromethorphan, labetalol, [DISCONTINUED] ondansetron **OR** ondansetron (ZOFRAN) IV, mouth rinse  Physical Exam Vitals reviewed.  Constitutional:      General: He is sleeping.     Appearance: He is ill-appearing.  Pulmonary:     Effort: Pulmonary effort is normal.  Skin:    General: Skin is warm and dry.  Neurological:     Mental Status: He is easily aroused.  Psychiatric:        Mood and Affect: Mood normal.            Vital Signs: BP (!) 156/78 (BP Location: Left Arm)   Pulse 93   Temp 98 F (36.7 C)   Resp 17    Ht 5\' 7"  (1.702 m)   Wt 64.9 kg   SpO2 98%   BMI 22.40 kg/m  SpO2: SpO2: 98 % O2 Device: O2 Device: Room Air O2 Flow Rate: O2 Flow Rate (L/min): 2 L/min  Intake/output summary: No intake or output data in the 24 hours ending 09/20/22  1056 LBM: Last BM Date : 09/18/22 Baseline Weight: Weight: 65.2 kg Most recent weight: Weight: 64.9 kg   Palliative Care Assessment & Plan   Patient Profile: 82 y.o. male with past medical history of CAD, stroke, DM2, HTN, HLD admitted on 08/18/2022 after a fall at his home with a large right-sided subdural hematoma. He was initially alert and oriented but on 08/21/22 he he became more drowsy and was only able to intermittently follow commands and intermittently answer questions. EEG showed seizure activity but he is now without seizures. SLP is seeing patient for dysphagia. RD assessing nutritional intake. Plan is to dc to SNF.   Assessment: Patient woke up briefly but fell back asleep. His wife and daughter-in-law were at bedside. His wife tells me he is going to have another swallow study. She hopes he will be changed from honey-thick liquids which would expand his options. They are still awaiting discharge to SNF.   Recommendations/Plan: DNR Continued PMT support. Encourage family to continue conversations around GOC  Discharge to SNF  Goals of Care and Additional Recommendations: Limitations on Scope of Treatment: Use MOST form for guidance  Code Status:    Code Status Orders  (From admission, onward)           Start     Ordered   08/19/22 0022  Do not attempt resuscitation (DNR)  Continuous       Question Answer Comment  If patient has no pulse and is not breathing Do Not Attempt Resuscitation   If patient has a pulse and/or is breathing: Medical Treatment Goals COMFORT MEASURES: Keep clean/warm/dry, use medication by any route; positioning, wound care and other measures to relieve pain/suffering; use oxygen, suction/manual treatment of  airway obstruction for comfort; do not transfer unless for comfort needs.   Consent: Discussion documented in EHR or advanced directives reviewed      08/19/22 0023             Thank you for allowing the Palliative Medicine Team to assist in the care of this patient.  Time spent: 30 minutes   Detailed review of medical records ( labs, imaging, vital signs), medically appropriate exam, counseling and education to care partner, documenting clinical information, medication management, coordination of care.  Sherryll Burger, NP  Please contact Palliative Medicine Team phone at [phone] for questions and concerns.

## 2022-09-20 NOTE — Progress Notes (Addendum)
PROGRESS NOTE   Raymond Sampson  ZOX:[id number] DOB: Mar 01, 1941 DOA: 08/18/2022 PCP: Gracelyn Nurse, MD  Brief Narrative:  82 year old white male apical MI 2007 stent X3, NSTEMI 1/ 2024, PCI DES CVA 2012 SVT Bilateral carotid artery disease Prior tobacco with bilateral claudication HLD HTN Prior pulmonary nodule Underwent staged PCI of left circumflex Fall resulting in right-sided rib fractures 8 through 12 on 06/19/2022 and hospitalized until 07/03/2022-he had an intramuscular hematoma at the time right-sided hemothorax General surgery recommended conservative management I-S flutter and was placed to skilled rehab  Patient was admitted from the ED on 5/17 with an accidental fall striking his head causing a laceration and bleeding--while in the ED he did have biphasic T waves with ST elevation in V2 V4 and T wave inversions which looked worse and-neurosurgery Dr. Johnsie Cancel was consulted as patient had fallen while on aspirin Plavix Cardiology was consulted--- he was minimally symptomatic and deferred surgery 5/18 repeat CT head with stable SDH and [facility] 5/20 developed intermittent focal seizures drowsy only intermittently following commands-Keppra/Vimpat started 5/21 critical care consulted, palliative care consulted 5/23 transferred to hospitalist service on tube feeds via core track, long-term EEG without seizure neurology signed off 09/06/2018 for change in pupil size repeat head CT negative 09/09/2022 amantadine started per epileptologist because of somnolence on Keppra   Hospital-Problem based course  Accidental fall-traumatic R SDH, SAH + midline shift while on ASA/Plavix  neurosurgery saw the patient and Patient not a candidate for evacuation-ASA/Plavix resumed 5/28 Previous fall 06/2022 and high risk for further falls-minimize meds that can cause sedation and will cut back sertraline nightly if able in the next 24 hours Sutures removed from area of left frontal laceration on  5/26  Possible recurrent pneumonitis White count 16 low-grade temp 99 CXR my overread shows potential hazy atelectasis but I am not convinced he has actual pneumonia-this is probably pneumonitis Start Unasyn and narrow quickly to liquid Augmentin   Partial focal seizures Defer to epileptologist-continue amantadine 100 daily, Vimpat 50 twice daily  HTN, CAD status post PCI with several stents previously - D/W Dr. Clifton James cardiology 09/20/2022-can stop aspirin, continue Plavix ongoing- PTA was on lisinopril 20 every evening which has been held since admission  Acute blood loss anemia-status post 2 units PRBC  Non-insulin-dependent DM TY 2 A1c 6.0 Insulin discontinued this hospitalization Supposed to be on surgical 10:25 AM and may need to resume, at this time holding metformin 1000 twice daily  Well-controlled HTN Continue amlodipine 10 daily, Coreg 12.5 twice daily   DVT prophylaxis: Lovenox Code Status: Full Family Communication: Discussed with wife in detail at the bedside Disposition:  Status is: Inpatient Remains inpatient appropriate because:   Patient has what looks like a new pneumonitis and a white count with a low-grade temp so we are treating for pneumonia Anticipate the patient may benefit from outpatient discussion with palliative care and hospice    Subjective: Awakens quite frail and weak however Coughing when he eats No pain no fever no chills Note low-grade temp 99.1 however last night   Objective: Vitals:   09/19/22 2047 09/20/22 0037 09/20/22 0314 09/20/22 1242  BP: (!) 149/71 (!) 149/75 (!) 156/78 114/63  Pulse: 85 78 93 (!) 102  Resp: 17 17 17 18   Temp: 98.9 F (37.2 C) 99.1 F (37.3 C) 98 F (36.7 C) 98.2 F (36.8 C)  TempSrc: Oral Oral  Axillary  SpO2: 99% 97% 98% 95%  Weight:      Height:  No intake or output data in the 24 hours ending 09/20/22 1519 Filed Weights   09/10/22 0745 09/12/22 0454 09/19/22 0500  Weight: 65.4 kg 64.4  kg 64.9 kg    Examination:  Awake coherent-coughing a little Chest is diminished posterolaterally on the right S1-S2 no murmur not on monitors at this time Abdomen is soft no rebound no guarding He is quite frail and cachectic  Data Reviewed: personally reviewed   CBC    Component Value Date/Time   WBC 16.3 (H) 09/20/2022 0203   RBC 3.99 (L) 09/20/2022 0203   HGB 11.9 (L) 09/20/2022 0203   HGB 13.4 05/29/2019 1113   HCT 37.4 (L) 09/20/2022 0203   HCT 38.9 05/29/2019 1113   PLT 361 09/20/2022 0203   PLT 259 05/29/2019 1113   MCV 93.7 09/20/2022 0203   MCV 89 05/29/2019 1113   MCV 88 12/27/2012 1721   MCH 29.8 09/20/2022 0203   MCHC 31.8 09/20/2022 0203   RDW 15.6 (H) 09/20/2022 0203   RDW 12.4 05/29/2019 1113   RDW 13.6 12/27/2012 1721   LYMPHSABS 1.5 09/06/2022 1020   MONOABS 1.1 (H) 09/06/2022 1020   EOSABS 0.5 09/06/2022 1020   BASOSABS 0.0 09/06/2022 1020      Latest Ref Rng & Units 09/20/2022    2:03 AM 09/18/2022    3:41 AM 09/16/2022    3:05 AM  CMP  Glucose 70 - 99 mg/dL 409  811  914   BUN 8 - 23 mg/dL 43  40  38   Creatinine 0.61 - 1.24 mg/dL 7.82  9.56  2.13   Sodium 135 - 145 mmol/L 137  138  136   Potassium 3.5 - 5.1 mmol/L 4.1  4.2  4.1   Chloride 98 - 111 mmol/L 100  100  98   CO2 22 - 32 mmol/L 26  25  26    Calcium 8.9 - 10.3 mg/dL 9.1  8.9  8.8      Radiology Studies: DG Swallowing Func-Speech Pathology  Result Date: 09/20/2022 Table formatting from the original result was not included. Modified Barium Swallow Study Patient Details Name: Raymond Sampson MRN: [id number] Date of Birth: September 03, 1940 Today's Date: 09/20/2022 HPI/PMH: HPI: 82 year old male who presented for fall. Hit forehead with laceration repair, no LOC, no facial fx, but CT 5/18 revealed: "stable large, approximately 17 mm thick Right Subdural Hematoma, most pronounced along the anterior right frontal convexity. Stable small volume right hemisphere [facility]." Chest CT 5/17: "Emphysema.  Right pleural effusion. Hazy upper lobe airspace disease is indeterminate for edema or pneumonia." Neuro change noted by RN around 1300 on 5/19 with workup revealing seizures, now controlled with medication. Pt with history of CAD, prior stroke, hypertension, hyperlipidemia, type 2 diabetes, chronic antiplatelet use with aspirin and Plavix. MBS revealed hardware from ACDF. Cortrak placed 5/22;ST f/u for dysphagia tx/management. Repeat MBS recommended initiating a Dysphagia 1(puree)/Honey-thickened liquid diet.  ST f/u for dysphagia tx/management/education. Clinical Impression: Clinical Impression: Pt presents with ongoing oropharyngeal dysphagia with diffuse deficits related to oral control, timing of swallow onset, reduced pharyngeal squeeze and base-of tongue contraction.  Variability of swallow function is great, ranging from adequate airway protection with small sips of thin liquid to gross aspiration of same consistency.  Vallecular residue is severe with poor ability to clear with subsequent dry swallows.  Over time, all administered POs eventually spilled in trace amounts to the level of the vocal folds. Cough response to aspiration was inconsistent.  Postural adjustments were ineffective. After  the study, imaging was shared with Mrs. Ryden and we had a long discussion about his slow progress and the large number of risk factors he has that may lead to an aspiration pna. The variability of his swallow function obscures an easy solution to his dysphagia. We agreed to continue dysphagia 1, honey thick liquids for now; allow sips of thin water between meals and after oral care to help meet hydration needs.  Mrs.Saldanha recognizes that if her husband's quality of life is compromised by limiting his food/drink opportunities, she may decide to shift to comfort POs in the future. She knows she has access to palliative care if needed.  We discussed this plan with Dr. Mahala Menghini.  SLP will continue to follow while pt  is admitted. Factors that may increase risk of adverse event in presence of aspiration [name] 2021): Factors that may increase risk of adverse event in presence of aspiration [name] 2021): Reduced cognitive function; Limited mobility; Frail or deconditioned; Dependence for feeding and/or oral hygiene; Weak cough; Aspiration of thick, dense, and/or acidic materials Recommendations/Plan: Swallowing Evaluation Recommendations Swallowing Evaluation Recommendations Recommendations: PO diet PO Diet Recommendation: Dysphagia 1 (Pureed); Moderately thick liquids (Level 3, honey thick) Liquid Administration via: Spoon Medication Administration: Crushed with puree Supervision: Full assist for feeding Swallowing strategies  : Slow rate; Multiple dry swallows after each bite/sip Postural changes: Position pt fully upright for meals Oral care recommendations: Oral care BID (2x/day) Treatment Plan Treatment Plan Treatment recommendations: Therapy as outlined in treatment plan below Treatment frequency: Min 2x/week Treatment duration: 1 week Interventions: Patient/family education; Diet toleration management by SLP Recommendations Recommendations for follow up therapy are one component of a multi-disciplinary discharge planning process, led by the attending physician.  Recommendations may be updated based on patient status, additional functional criteria and insurance authorization. Assessment: Orofacial Exam: Orofacial Exam Oral Cavity: Oral Hygiene: WFL Oral Cavity - Dentition: Missing dentition Oral Motor/Sensory Function: Suspected cranial nerve impairment CN VII - Facial: Left motor impairment Anatomy: Anatomy: Presence of cervical hardware Boluses Administered: Boluses Administered Boluses Administered: Thin liquids (Level 0); Mildly thick liquids (Level 2, nectar thick); Moderately thick liquids (Level 3, honey thick)  Oral Impairment Domain: Oral Impairment Domain Lip Closure: Escape progressing to  mid-chin Tongue control during bolus hold: Escape to lateral buccal cavity/floor of mouth Bolus transport/lingual motion: Repetitive/disorganized tongue motion Oral residue: Trace residue lining oral structures Location of oral residue : Tongue Initiation of pharyngeal swallow : Pyriform sinuses  Pharyngeal Impairment Domain: Pharyngeal Impairment Domain Soft palate elevation: No bolus between soft palate (SP)/pharyngeal wall (PW) Laryngeal elevation: Partial superior movement of thyroid cartilage/partial approximation of arytenoids to epiglottic petiole Anterior hyoid excursion: Partial anterior movement Epiglottic movement: Complete inversion Laryngeal vestibule closure: Incomplete, narrow column air/contrast in laryngeal vestibule Pharyngeal stripping wave : Present - diminished Pharyngeal contraction (A/P view only): N/A Pharyngoesophageal segment opening: Partial distention/partial duration, partial obstruction of flow Tongue base retraction: Wide column of contrast or air between tongue base and PPW Pharyngeal residue: Collection of residue within or on pharyngeal structures Location of pharyngeal residue: Valleculae; Pyriform sinuses  Esophageal Impairment Domain: Esophageal Impairment Domain Esophageal clearance upright position: -- (n/a) Pill: Pill Consistency administered: -- (nt) Penetration/Aspiration Scale Score: Penetration/Aspiration Scale Score 1.  Material does not enter airway: Thin liquids (Level 0) 3.  Material enters airway, remains ABOVE vocal cords and not ejected out: Mildly thick liquids (Level 2, nectar thick); Moderately thick liquids (Level 3, honey thick) 5.  Material enters airway,  CONTACTS cords and not ejected out: Mildly thick liquids (Level 2, nectar thick); Moderately thick liquids (Level 3, honey thick) 7.  Material enters airway, passes BELOW cords and not ejected out despite cough attempt by patient: Thin liquids (Level 0) 8.  Material enters airway, passes BELOW cords without  attempt by patient to eject out (silent aspiration) : Thin liquids (Level 0); Mildly thick liquids (Level 2, nectar thick); Moderately thick liquids (Level 3, honey thick) Compensatory Strategies: Compensatory Strategies Compensatory strategies: Yes Straw: Ineffective Ineffective Straw: Thin liquid (Level 0); Mildly thick liquid (Level 2, nectar thick) Chin tuck: Ineffective Left head turn: Ineffective Right head turn: Ineffective   General Information: Caregiver present: No  Diet Prior to this Study: Dysphagia 1 (pureed); Moderately thick liquids (Level 3, honey thick)   Temperature : Febrile   No data recorded  Supplemental O2: None (Room air)   History of Recent Intubation: No  Behavior/Cognition: Alert; Cooperative Self-Feeding Abilities: Needs assist with self-feeding Baseline vocal quality/speech: Hypophonia/low volume Volitional Cough: Able to elicit Volitional Swallow: Able to elicit Exam Limitations: No limitations Goal Planning: Prognosis for improved oropharyngeal function: Fair Barriers to Reach Goals: Cognitive deficits; Time post onset; Severity of deficits No data recorded No data recorded Consulted and agree with results and recommendations: Family member/caregiver Pain: Pain Assessment Pain Assessment: No/denies pain End of Session: Start Time:SLP Start Time (ACUTE ONLY): 1050 Stop Time: SLP Stop Time (ACUTE ONLY): 1115 Time Calculation:SLP Time Calculation (min) (ACUTE ONLY): 25 min Charges: SLP Evaluations $ SLP Speech Visit: 1 Visit SLP Evaluations $MBS Swallow: 1 Procedure SLP visit diagnosis: SLP Visit Diagnosis: Dysphagia, oropharyngeal phase (R13.12) Past Medical History: Past Medical History: Diagnosis Date  Arthritic-like pain   CAD (coronary artery disease)   a. apical MI 5/07 with LHC showing 50% pCFX, 30% pRCA, 99% pLAD, 99% mLAD, 75% dLAD (cypher DES x3 to LAD); b. ETT-myoview (12/08) 81% MPHR,  EF 64%, partially reversible inferoapical perfusion defect similar to prior study 12/07; c.  04/2016 MV: EF 59%, fixed anteroapical defect, no ischemia->Low risk.  Carotid arterial disease (HCC)   a. 05/2016 Carotid U/S: 30-40% bilat dzs, f/u in 2 yrs.  Chest pain 04/13/2022  CVA (cerebral vascular accident) [facility])   a. 07/2010 - h/o CVA/TIA.  Diet-controlled type 2 diabetes mellitus (HCC)   HLD (hyperlipidemia)   HTN (hypertension)   PAT (paroxysmal atrial tachycardia)   a. 06/2016 Event monitor:  rare episodes of atrial tachycardia, longest 7 beats. No afib. Past Surgical History: Past Surgical History: Procedure Laterality Date  BACK SURGERY    CARDIAC CATHETERIZATION    CORONARY ANGIOPLASTY WITH STENT PLACEMENT    apical MI 5/07 with LHC showing 50% pCFX, 30% pRCA, 99% pLAD, 9%% mLAD, 75% dLAD. cypher DES x3 to LAD. ETT-myoview (12/08) 81% MPHR, 8'1, EF 64%, partially reversible inferoapical perfusion defect similar to prior study 12/07  CORONARY IMAGING/OCT N/A 04/14/2022  Procedure: INTRAVASCULAR IMAGING/OCT;  Surgeon: Yvonne Kendall, MD;  Location: [facility] INVASIVE CV LAB;  Service: Cardiovascular;  Laterality: N/A;  CORONARY STENT INTERVENTION N/A 04/14/2022  Procedure: CORONARY STENT INTERVENTION;  Surgeon: Yvonne Kendall, MD;  Location: [facility] INVASIVE CV LAB;  Service: Cardiovascular;  Laterality: N/A;  CORONARY STENT INTERVENTION Left 05/02/2022  Procedure: CORONARY STENT INTERVENTION;  Surgeon: Yvonne Kendall, MD;  Location: [facility] INVASIVE CV LAB;  Service: Cardiovascular;  Laterality: Left;  LEFT HEART CATH AND CORONARY ANGIOGRAPHY N/A 04/13/2022  Procedure: LEFT HEART CATH AND CORONARY ANGIOGRAPHY;  Surgeon: Marykay Lex, MD;  Location: [facility] INVASIVE CV  LAB;  Service: Cardiovascular;  Laterality: N/A; [name] [name] 09/20/2022, 12:53 PM  DG CHEST PORT 1 VIEW  Result Date: 09/20/2022 CLINICAL DATA:  Pneumonia EXAM: PORTABLE CHEST 1 VIEW COMPARISON:  09/10/2022 FINDINGS: Hazy right basilar airspace disease which may reflect pneumonia versus atelectasis. No pleural effusion or  pneumothorax. Heart and mediastinal contours are unremarkable. Coronary artery atherosclerosis. No acute osseous abnormality. IMPRESSION: 1. Hazy right basilar airspace disease which may reflect pneumonia versus atelectasis. Electronically Signed   By: Elige Ko M.D.   On: 09/20/2022 09:21     Scheduled Meds:  amantadine  100 mg Oral Daily   amLODipine  10 mg Per Tube Daily   aspirin  81 mg Per Tube Daily   atorvastatin  80 mg Per Tube Daily   carvedilol  12.5 mg Per Tube BID WC   clopidogrel  75 mg Per Tube Daily   enoxaparin (LOVENOX) injection  40 mg Subcutaneous Q24H   Gerhardt's butt cream   Topical QID   lacosamide  50 mg Per Tube BID   lactose free nutrition  237 mL Oral BID BM   lidocaine  1 patch Transdermal Q24H   multivitamin with minerals  1 tablet Per Tube Daily   mouth rinse  15 mL Mouth Rinse 4 times per day   sertraline  25 mg Per Tube QPM   Continuous Infusions:  sodium chloride 10 mL/hr at 09/11/22 0226   ampicillin-sulbactam (UNASYN) IV       LOS: 33 days   Time spent: 63  Rhetta Mura, MD [facility] To contact the attending provider between 7A-7P or the covering provider during after hours 7P-7A, please log into the web site [email or web address] and access using universal [place] password for that web site. If you do not have the password, please call the hospital operator.  09/20/2022, 3:19 PM

## 2022-09-21 DIAGNOSIS — S065X0D Traumatic subdural hemorrhage without loss of consciousness, subsequent encounter: Secondary | ICD-10-CM | POA: Diagnosis not present

## 2022-09-21 LAB — CBC WITH DIFFERENTIAL/PLATELET
Abs Immature Granulocytes: 0.59 10*3/uL — ABNORMAL HIGH (ref 0.00–0.07)
Basophils Absolute: 0 10*3/uL (ref 0.0–0.1)
Basophils Relative: 0 %
Eosinophils Absolute: 0 10*3/uL (ref 0.0–0.5)
Eosinophils Relative: 0 %
HCT: 33 % — ABNORMAL LOW (ref 39.0–52.0)
Hemoglobin: 10.4 g/dL — ABNORMAL LOW (ref 13.0–17.0)
Immature Granulocytes: 2 %
Lymphocytes Relative: 5 %
Lymphs Abs: 1.4 10*3/uL (ref 0.7–4.0)
MCH: 29.8 pg (ref 26.0–34.0)
MCHC: 31.5 g/dL (ref 30.0–36.0)
MCV: 94.6 fL (ref 80.0–100.0)
Monocytes Absolute: 1.7 10*3/uL — ABNORMAL HIGH (ref 0.1–1.0)
Monocytes Relative: 6 %
Neutro Abs: 23.8 10*3/uL — ABNORMAL HIGH (ref 1.7–7.7)
Neutrophils Relative %: 87 %
Platelets: 288 10*3/uL (ref 150–400)
RBC: 3.49 MIL/uL — ABNORMAL LOW (ref 4.22–5.81)
RDW: 16 % — ABNORMAL HIGH (ref 11.5–15.5)
WBC: 27.5 10*3/uL — ABNORMAL HIGH (ref 4.0–10.5)
nRBC: 0 % (ref 0.0–0.2)

## 2022-09-21 LAB — BASIC METABOLIC PANEL
Anion gap: 12 (ref 5–15)
BUN: 59 mg/dL — ABNORMAL HIGH (ref 8–23)
CO2: 24 mmol/L (ref 22–32)
Calcium: 8.8 mg/dL — ABNORMAL LOW (ref 8.9–10.3)
Chloride: 101 mmol/L (ref 98–111)
Creatinine, Ser: 1.51 mg/dL — ABNORMAL HIGH (ref 0.61–1.24)
GFR, Estimated: 46 mL/min — ABNORMAL LOW (ref >60)
Glucose, Bld: 227 mg/dL — ABNORMAL HIGH (ref 70–99)
Potassium: 3.9 mmol/L (ref 3.5–5.1)
Sodium: 137 mmol/L (ref 135–145)

## 2022-09-21 MED ORDER — HALOPERIDOL 0.5 MG PO TABS: 0.5000 mg | ORAL_TABLET | ORAL | Status: DC | PRN

## 2022-09-21 MED ORDER — HALOPERIDOL LACTATE 5 MG/ML IJ SOLN: 0.5000 mg | INTRAMUSCULAR | Status: DC | PRN

## 2022-09-21 MED ORDER — POLYVINYL ALCOHOL 1.4 % OP SOLN: 1.0000 [drp] | Freq: Four times a day (QID) | OPHTHALMIC | Status: DC | PRN

## 2022-09-21 MED ORDER — GLYCOPYRROLATE 1 MG PO TABS: 1.0000 mg | ORAL_TABLET | ORAL | Status: DC | PRN

## 2022-09-21 MED ORDER — SODIUM CHLORIDE 0.9 % IV SOLN: 250.0000 mL | INTRAVENOUS | Status: DC | PRN

## 2022-09-21 MED ORDER — GLYCOPYRROLATE 0.2 MG/ML IJ SOLN: 0.2000 mg | INTRAMUSCULAR | Status: DC | PRN

## 2022-09-21 MED ORDER — ACETAMINOPHEN 325 MG PO TABS: 650.0000 mg | ORAL_TABLET | Freq: Four times a day (QID) | ORAL | Status: DC | PRN

## 2022-09-21 MED ORDER — LORAZEPAM 2 MG/ML IJ SOLN: 1.0000 mg | INTRAMUSCULAR | Status: DC | PRN

## 2022-09-21 MED ORDER — ALBUTEROL SULFATE (2.5 MG/3ML) 0.083% IN NEBU: 2.5000 mg | INHALATION_SOLUTION | RESPIRATORY_TRACT | Status: DC | PRN

## 2022-09-21 MED ORDER — ONDANSETRON 4 MG PO TBDP: 4.0000 mg | ORAL_TABLET | Freq: Four times a day (QID) | ORAL | Status: DC | PRN

## 2022-09-21 MED ORDER — BIOTENE DRY MOUTH MT LIQD: 15.0000 mL | OROMUCOSAL | Status: DC | PRN

## 2022-09-21 MED ORDER — SODIUM CHLORIDE 0.9% FLUSH
3.0000 mL | Freq: Two times a day (BID) | INTRAVENOUS | Status: DC
  Administered 2022-09-21 – 2022-09-22 (×2): 3 mL via INTRAVENOUS

## 2022-09-21 MED ORDER — SODIUM CHLORIDE 0.9% FLUSH: 3.0000 mL | INTRAVENOUS | Status: DC | PRN

## 2022-09-21 MED ORDER — ONDANSETRON HCL 4 MG/2ML IJ SOLN: 4.0000 mg | Freq: Four times a day (QID) | INTRAMUSCULAR | Status: DC | PRN

## 2022-09-21 MED ORDER — ACETAMINOPHEN 650 MG RE SUPP: 650.0000 mg | Freq: Four times a day (QID) | RECTAL | Status: DC | PRN

## 2022-09-21 MED ORDER — SODIUM CHLORIDE 0.9 % IV SOLN: INTRAVENOUS | Status: DC

## 2022-09-21 MED ORDER — HALOPERIDOL LACTATE 2 MG/ML PO CONC: 0.5000 mg | ORAL | Status: DC | PRN

## 2022-09-21 NOTE — IPAL (Signed)
Met Tammy daughter @ bedside and Marylouise Stacks was there also  D/w Wilma--they seem convinced that Hospice is the way forward  " He would not want to suffer"  I explained/highlighted the differences between agressive medical trajectory as well as Comfort based Hospice care.  We have elected to discontinue most non essential med as well as d/c futile interventions--I have stopped labs/diagnostics which would not likely change his trajectory  Tammy, daughter expressed familiarity with being hospitalized--therapeutic listening provided--space opened to allow for her expression of guilt/emotion and sense of loss.  Please see orders.. >20 min Advance care planning time  Pleas Koch, MD [facility] 6:56 PM

## 2022-09-21 NOTE — Progress Notes (Signed)
Civil engineer, contracting [facility]) Hospital Liaison Note  Referral received for patient/family interest in hospice home-[place]. Chart under review by [facility] physician.   Bed offered and accepted for transfer tomorrow. Consents will be completed today.   Please call with any questions or concerns. Thank you  Dionicio Stall, [facility] Liaison  [phone]

## 2022-09-21 NOTE — Progress Notes (Signed)
Daily Progress Note   Patient Name: Raymond Sampson       Date: 09/21/2022 DOB: 26-Mar-1941  Age: 82 y.o. MRN#: [id number] Attending Physician: Rhetta Mura, MD Primary Care Physician: Gracelyn Nurse, MD Admit Date: 08/18/2022  Reason for Consultation/Follow-up: Establishing goals of care  Subjective: Patient is asleep in NAD. Wife at bedside.  Length of Stay: 34  Current Medications: Scheduled Meds:   amantadine  100 mg Oral Daily   amLODipine  10 mg Per Tube Daily   atorvastatin  80 mg Per Tube Daily   carvedilol  12.5 mg Per Tube BID WC   clopidogrel  75 mg Per Tube Daily   enoxaparin (LOVENOX) injection  40 mg Subcutaneous Q24H   Gerhardt's butt cream   Topical QID   lacosamide  50 mg Per Tube BID   lactose free nutrition  237 mL Oral BID BM   lidocaine  1 patch Transdermal Q24H   multivitamin with minerals  1 tablet Per Tube Daily   mouth rinse  15 mL Mouth Rinse 4 times per day   sertraline  25 mg Per Tube QPM    Continuous Infusions:  sodium chloride 10 mL/hr at 09/11/22 0226   sodium chloride     ampicillin-sulbactam (UNASYN) IV 3 g (09/21/22 0843)    PRN Meds: sodium chloride, acetaminophen (TYLENOL) oral liquid 160 mg/5 mL, [DISCONTINUED] acetaminophen **OR** acetaminophen, clonazepam, guaiFENesin-dextromethorphan, [DISCONTINUED] ondansetron **OR** ondansetron (ZOFRAN) IV, mouth rinse  Physical Exam Vitals reviewed.  Constitutional:      General: He is sleeping.     Appearance: He is ill-appearing.  Pulmonary:     Effort: Pulmonary effort is normal.             Vital Signs: BP (!) 123/58 (BP Location: Right Arm)   Pulse 80   Temp 98.2 F (36.8 C) (Oral)   Resp 16   Ht 5\' 7"  (1.702 m)   Wt 64.9 kg   SpO2 94%   BMI 22.40 kg/m  SpO2: SpO2:  94 % O2 Device: O2 Device: Room Air O2 Flow Rate: O2 Flow Rate (L/min): 2 L/min  Intake/output summary:  Intake/Output Summary (Last 24 hours) at 09/21/2022 1017 Last data filed at 09/20/2022 1800 Gross per 24 hour  Intake 400.12 ml  Output 450 ml  Net -49.88 ml  LBM: Last BM Date : 09/21/22 Baseline Weight: Weight: 65.2 kg Most recent weight: Weight: 64.9 kg       Palliative Assessment/Data: 20%      Patient Active Problem List   Diagnosis Date Noted   Abnormal EKG 09/19/2022   Facial laceration 09/06/2022   Hyponatremia 09/06/2022   SDH (subdural hematoma) (HCC) 09/06/2022   Seizure (HCC) 09/06/2022   Hypokalemia 09/06/2022   Hypophosphatemia 09/06/2022   Cerebrovascular accident (CVA) (HCC) 09/06/2022   Dysphagia 09/06/2022   CAD S/P percutaneous coronary angioplasty 09/06/2022   Protein-calorie malnutrition, severe 08/24/2022   Provoked seizures (HCC) 08/23/2022   Encephalopathy acute 08/23/2022   DNR (do not resuscitate) 08/23/2022   Traumatic subdural hematoma without loss of consciousness (HCC) 08/18/2022   Fall 06/22/2022   Malnutrition of moderate degree 06/22/2022   Acute blood loss anemia 06/21/2022   Multiple rib fractures involving four or more ribs, right side 06/20/2022   Intramuscular hematoma 06/19/2022   Rib fractures 06/19/2022   Hemothorax on right 06/19/2022   Leukocytosis 06/19/2022   Status post coronary artery stent placement 05/02/2022   Mild dementia (HCC) 04/14/2022   Non-ST elevation (NSTEMI) myocardial infarction (HCC) 04/13/2022   History of COVID-19 04/13/2022   Lung nodule 04/13/2022   PAD (peripheral artery disease) (HCC) 04/13/2022   Flu 06/05/2017   Memory loss, short term 02/07/2016   History of CVA (cerebrovascular accident) 02/07/2016   Diabetes mellitus type 2, uncomplicated (HCC) 04/06/2015   Carotid stenosis 05/14/2013   CAROTID BRUIT, LEFT 04/25/2010   PALPITATIONS 06/18/2009   Hyperlipidemia 04/27/2009   Essential  hypertension 04/27/2009   Ischemic heart disease 04/27/2009    Palliative Care Assessment & Plan   Patient Profile: 82 y.o. male with past medical history of CAD, stroke, DM2, HTN, HLD admitted on 08/18/2022 after a fall at his home with a large right-sided subdural hematoma. He was initially alert and oriented but on 08/21/22 he he became more drowsy and was only able to intermittently follow commands and intermittently answer questions. EEG showed seizure activity but he is now without seizures. SLP is seeing patient for dysphagia. RD assessing nutritional intake. Patient had low grade fever and increase in WBC 09/20/22. He was started on antibiotics.  Assessment: After speaking with SLP yesterday and the patient's new fever/antibiotic need, the patient's wife [name] wants to discuss  the difference between outpatient palliative and hospice options. We also discussed diagnosis prognosis, GOC, EOL wishes, disposition and options. [name] states she knows the patient would not want to be in and out of hospitals and that his quality of life is decreased with his current condition. She also states she does not want him to feel like she has given up on him-- it would be easier for her if he provided input that he was tired of fighting.   We discussed that his body is frail/fragile with little reserve for recovery when something goes wrong. I told her I believed he would be appropriate for inpatient hospice. She is tearful and I provide emotional support. She will consider hospice.   Discussed the importance of continued conversation with family and the medical providers regarding overall plan of care and treatment options, ensuring decisions are within the context of the patient's values and GOCs. Patient's family face ongoing treatment option decisions, advanced directive decisions, and anticipatory care needs.  1100 Spoke with LCSW the patient's family has decided to move forward with inpatient  hospice.  1200 Checked on patient and his wife. Patient is  sleeping. Family is visiting. Patient's wife will reach out to PMT with any needs.  Recommendations/Plan: DNR Continued PMT support for GOC decisions  Goals of Care and Additional Recommendations: Limitations on Scope of Treatment: Use MOST form for guidance  Code Status:    Code Status Orders  (From admission, onward)           Start     Ordered   08/19/22 0022  Do not attempt resuscitation (DNR)  Continuous       Question Answer Comment  If patient has no pulse and is not breathing Do Not Attempt Resuscitation   If patient has a pulse and/or is breathing: Medical Treatment Goals COMFORT MEASURES: Keep clean/warm/dry, use medication by any route; positioning, wound care and other measures to relieve pain/suffering; use oxygen, suction/manual treatment of airway obstruction for comfort; do not transfer unless for comfort needs.   Consent: Discussion documented in EHR or advanced directives reviewed      08/19/22 0023             Care plan was discussed with Dr. Mahala Menghini, Regan Lemming, and bedside RN  Thank you for allowing the Palliative Medicine Team to assist in the care of this patient.  Time spent: 60 minutes   Detailed review of medical records ( labs, imaging, vital signs), medically appropriate exam, counseling and education to care partner, documenting clinical information, medication management, coordination of care.   Sherryll Burger, NP  Please contact Palliative Medicine Team phone at [phone] for questions and concerns.

## 2022-09-21 NOTE — Progress Notes (Signed)
PROGRESS NOTE   Raymond Sampson  ZOX:[id number] DOB: 1940/10/03 DOA: 08/18/2022 PCP: Gracelyn Nurse, MD  Brief Narrative:  82 year old white male apical MI 2007 stent X3, NSTEMI 1/ 2024, PCI DES CVA 2012 SVT Bilateral carotid artery disease Prior tobacco with bilateral claudication HLD HTN Prior pulmonary nodule Underwent staged PCI of left circumflex Fall resulting in right-sided rib fractures 8 through 12 on 06/19/2022 and hospitalized until 07/03/2022-he had an intramuscular hematoma at the time right-sided hemothorax General surgery recommended conservative management I-S flutter and was placed to skilled rehab  Patient was admitted from the ED on 5/17 with an accidental fall striking his head causing a laceration and bleeding--while in the ED he did have biphasic T waves with ST elevation in V2 V4 and T wave inversions which looked worse and-neurosurgery Dr. Johnsie Cancel was consulted as patient had fallen while on aspirin Plavix Cardiology was consulted--- he was minimally symptomatic and deferred surgery 5/18 repeat CT head with stable SDH and [facility] 5/20 developed intermittent focal seizures drowsy only intermittently following commands-Keppra/Vimpat started 5/21 critical care consulted, palliative care consulted 5/23 transferred to hospitalist service on tube feeds via core track, long-term EEG without seizure neurology signed off 09/06/2018 for change in pupil size repeat head CT negative 09/09/2022 amantadine started per epileptologist because of somnolence on Keppra   Hospital-Problem based course  Accidental fall-traumatic R SDH, SAH + midline shift while on ASA/Plavix  neurosurgery saw the patient and Patient not a candidate for evacuation-ASA/Plavix resumed 5/28 Previous fall 06/2022 and high risk for further falls Sutures removed on 5/26   Likely recurrent pneumonia Long discussion with daughter at the bedside this morning-note elevation of white count from 16-25 with  associated AKI and increasing lethargy despite starting Unasyn on 6/19--we will continue IV fluid as well as antibiotics until discharged to hospice to afford patient/family time to visit with relatives--I do think he continues to aspirate and we have elected after much discussion with family to not do any further diagnostic testing in terms of x-rays or lab work I do feel that patient is more ill and that his body is "giving way"--I expressed this plainly to his wife who is at the bedside-palliative care had just seen the patient and discussed this with her as well She understands the implication and gravity of his worsening and after discussion with family is agreeable to hospice facility placement-I have told her that the patient probably has less than a month if that in terms of longevity   Partial focal seizures Continue amantadine 100 daily, Vimpat 50 twice daily-would narrow/discontinue based on hospice philosophy   HTN, CAD status post PCI with several stents previously - D/W Dr. Clifton James cardiology 09/20/2022-aspirin stopped can continue for now Plavix PTA was on lisinopril 20 every evening which has been held since admission   Acute blood loss anemia-status post 2 units PRBC-no further blood draws   Non-insulin-dependent DM TY 2 A1c 6.0 Insulin discontinued this hospitalization Stop all checks and coverage   Well-controlled HTN De-escalate off of amlodipine 10 daily, Coreg 12.5 twice daily once discharged to freestanding hospice on 6/21     DVT prophylaxis: Lovenox Code Status: Full Family Communication: Discussed with wife in detail at the bedside Disposition:  Status is: Inpatient Remains inpatient appropriate because:   Patient will be going to inpatient hospice probably for 6/21    Subjective:  Ill-appearing--less responsive than prior   Objective: Vitals:   09/21/22 0324 09/21/22 0824 09/21/22 1107 09/21/22 1602  BP: Marland Kitchen)  135/59 (!) 123/58 (!) 111/54 122/65   Pulse: 82 80 73 77  Resp: 15 16 16    Temp: 98 F (36.7 C) 98.2 F (36.8 C) 98.4 F (36.9 C) 98.3 F (36.8 C)  TempSrc: Oral Oral Oral Oral  SpO2: 95% 94% 96% 95%  Weight:      Height:        Intake/Output Summary (Last 24 hours) at 09/21/2022 1628 Last data filed at 09/21/2022 1500 Gross per 24 hour  Intake 220.21 ml  Output 550 ml  Net -329.79 ml   Filed Weights   09/10/22 0745 09/12/22 0454 09/19/22 0500  Weight: 65.4 kg 64.4 kg 64.9 kg    Examination:  Looks weak-less coherent coherent--- can tell me is in hospital, can remember his wife's name CTAB but rales left posterio lateral area Abdomen soft no rebound no guarding No lower extremity edema More sleepy than prior  Data Reviewed: personally reviewed   CBC    Component Value Date/Time   WBC 27.5 (H) 09/21/2022 0354   RBC 3.49 (L) 09/21/2022 0354   HGB 10.4 (L) 09/21/2022 0354   HGB 13.4 05/29/2019 1113   HCT 33.0 (L) 09/21/2022 0354   HCT 38.9 05/29/2019 1113   PLT 288 09/21/2022 0354   PLT 259 05/29/2019 1113   MCV 94.6 09/21/2022 0354   MCV 89 05/29/2019 1113   MCV 88 12/27/2012 1721   MCH 29.8 09/21/2022 0354   MCHC 31.5 09/21/2022 0354   RDW 16.0 (H) 09/21/2022 0354   RDW 12.4 05/29/2019 1113   RDW 13.6 12/27/2012 1721   LYMPHSABS 1.4 09/21/2022 0354   MONOABS 1.7 (H) 09/21/2022 0354   EOSABS 0.0 09/21/2022 0354   BASOSABS 0.0 09/21/2022 0354      Latest Ref Rng & Units 09/21/2022    3:54 AM 09/20/2022    2:03 AM 09/18/2022    3:41 AM  CMP  Glucose 70 - 99 mg/dL 409  811  914   BUN 8 - 23 mg/dL 59  43  40   Creatinine 0.61 - 1.24 mg/dL 7.82  9.56  2.13   Sodium 135 - 145 mmol/L 137  137  138   Potassium 3.5 - 5.1 mmol/L 3.9  4.1  4.2   Chloride 98 - 111 mmol/L 101  100  100   CO2 22 - 32 mmol/L 24  26  25    Calcium 8.9 - 10.3 mg/dL 8.8  9.1  8.9      Radiology Studies: DG Swallowing Func-Speech Pathology  Result Date: 09/20/2022 Table formatting from the original result was not  included. Modified Barium Swallow Study Patient Details Name: [name] MRN: [id number] Date of Birth: [birth year] Today's Date: 09/20/2022 HPI/PMH: HPI: 82 year old male who presented for fall. Hit forehead with laceration repair, no LOC, no facial fx, but CT 5/18 revealed: "stable large, approximately 17 mm thick Right Subdural Hematoma, most pronounced along the anterior right frontal convexity. Stable small volume right hemisphere [facility] Chest CT 5/17: "Emphysema. Right pleural effusion. Hazy upper lobe airspace disease is indeterminate for edema or pneumonia." Neuro change noted by RN around 1300 on 5/19 with workup revealing seizures, now controlled with medication. Pt with history of CAD, prior stroke, hypertension, hyperlipidemia, type 2 diabetes, chronic antiplatelet use with aspirin and Plavix. MBS revealed hardware from ACDF. Cortrak placed 5/22;ST f/u for dysphagia tx/management. Repeat MBS recommended initiating a Dysphagia 1(puree)/Honey-thickened liquid diet.  ST f/u for dysphagia tx/management/education. Clinical Impression: Clinical Impression: Pt presents with ongoing oropharyngeal dysphagia  with diffuse deficits related to oral control, timing of swallow onset, reduced pharyngeal squeeze and base-of tongue contraction.  Variability of swallow function is great, ranging from adequate airway protection with small sips of thin liquid to gross aspiration of same consistency.  Vallecular residue is severe with poor ability to clear with subsequent dry swallows.  Over time, all administered POs eventually spilled in trace amounts to the level of the vocal folds. Cough response to aspiration was inconsistent.  Postural adjustments were ineffective. After the study, imaging was shared with Mrs. Catarino and we had a long discussion about his slow progress and the large number of risk factors he has that may lead to an aspiration pna. The variability of his swallow function obscures an easy solution to  his dysphagia. We agreed to continue dysphagia 1, honey thick liquids for now; allow sips of thin water between meals and after oral care to help meet hydration needs.  Mrs.Mccants recognizes that if her husband's quality of life is compromised by limiting his food/drink opportunities, she may decide to shift to comfort POs in the future. She knows she has access to palliative care if needed.  We discussed this plan with Dr. Mahala Menghini.  SLP will continue to follow while pt is admitted. Factors that may increase risk of adverse event in presence of aspiration [name] 2021): Factors that may increase risk of adverse event in presence of aspiration [name] 2021): Reduced cognitive function; Limited mobility; Frail or deconditioned; Dependence for feeding and/or oral hygiene; Weak cough; Aspiration of thick, dense, and/or acidic materials Recommendations/Plan: Swallowing Evaluation Recommendations Swallowing Evaluation Recommendations Recommendations: PO diet PO Diet Recommendation: Dysphagia 1 (Pureed); Moderately thick liquids (Level 3, honey thick) Liquid Administration via: Spoon Medication Administration: Crushed with puree Supervision: Full assist for feeding Swallowing strategies  : Slow rate; Multiple dry swallows after each bite/sip Postural changes: Position pt fully upright for meals Oral care recommendations: Oral care BID (2x/day) Treatment Plan Treatment Plan Treatment recommendations: Therapy as outlined in treatment plan below Treatment frequency: Min 2x/week Treatment duration: 1 week Interventions: Patient/family education; Diet toleration management by SLP Recommendations Recommendations for follow up therapy are one component of a multi-disciplinary discharge planning process, led by the attending physician.  Recommendations may be updated based on patient status, additional functional criteria and insurance authorization. Assessment: Orofacial Exam: Orofacial Exam Oral Cavity: Oral  Hygiene: WFL Oral Cavity - Dentition: Missing dentition Oral Motor/Sensory Function: Suspected cranial nerve impairment CN VII - Facial: Left motor impairment Anatomy: Anatomy: Presence of cervical hardware Boluses Administered: Boluses Administered Boluses Administered: Thin liquids (Level 0); Mildly thick liquids (Level 2, nectar thick); Moderately thick liquids (Level 3, honey thick)  Oral Impairment Domain: Oral Impairment Domain Lip Closure: Escape progressing to mid-chin Tongue control during bolus hold: Escape to lateral buccal cavity/floor of mouth Bolus transport/lingual motion: Repetitive/disorganized tongue motion Oral residue: Trace residue lining oral structures Location of oral residue : Tongue Initiation of pharyngeal swallow : Pyriform sinuses  Pharyngeal Impairment Domain: Pharyngeal Impairment Domain Soft palate elevation: No bolus between soft palate (SP)/pharyngeal wall (PW) Laryngeal elevation: Partial superior movement of thyroid cartilage/partial approximation of arytenoids to epiglottic petiole Anterior hyoid excursion: Partial anterior movement Epiglottic movement: Complete inversion Laryngeal vestibule closure: Incomplete, narrow column air/contrast in laryngeal vestibule Pharyngeal stripping wave : Present - diminished Pharyngeal contraction (A/P view only): N/A Pharyngoesophageal segment opening: Partial distention/partial duration, partial obstruction of flow Tongue base retraction: Wide column of contrast or air between tongue base and  PPW Pharyngeal residue: Collection of residue within or on pharyngeal structures Location of pharyngeal residue: Valleculae; Pyriform sinuses  Esophageal Impairment Domain: Esophageal Impairment Domain Esophageal clearance upright position: -- (n/a) Pill: Pill Consistency administered: -- (nt) Penetration/Aspiration Scale Score: Penetration/Aspiration Scale Score 1.  Material does not enter airway: Thin liquids (Level 0) 3.  Material enters airway,  remains ABOVE vocal cords and not ejected out: Mildly thick liquids (Level 2, nectar thick); Moderately thick liquids (Level 3, honey thick) 5.  Material enters airway, CONTACTS cords and not ejected out: Mildly thick liquids (Level 2, nectar thick); Moderately thick liquids (Level 3, honey thick) 7.  Material enters airway, passes BELOW cords and not ejected out despite cough attempt by patient: Thin liquids (Level 0) 8.  Material enters airway, passes BELOW cords without attempt by patient to eject out (silent aspiration) : Thin liquids (Level 0); Mildly thick liquids (Level 2, nectar thick); Moderately thick liquids (Level 3, honey thick) Compensatory Strategies: Compensatory Strategies Compensatory strategies: Yes Straw: Ineffective Ineffective Straw: Thin liquid (Level 0); Mildly thick liquid (Level 2, nectar thick) Chin tuck: Ineffective Left head turn: Ineffective Right head turn: Ineffective   General Information: Caregiver present: No  Diet Prior to this Study: Dysphagia 1 (pureed); Moderately thick liquids (Level 3, honey thick)   Temperature : Febrile   No data recorded  Supplemental O2: None (Room air)   History of Recent Intubation: No  Behavior/Cognition: Alert; Cooperative Self-Feeding Abilities: Needs assist with self-feeding Baseline vocal quality/speech: Hypophonia/low volume Volitional Cough: Able to elicit Volitional Swallow: Able to elicit Exam Limitations: No limitations Goal Planning: Prognosis for improved oropharyngeal function: Fair Barriers to Reach Goals: Cognitive deficits; Time post onset; Severity of deficits No data recorded No data recorded Consulted and agree with results and recommendations: Family member/caregiver Pain: Pain Assessment Pain Assessment: No/denies pain End of Session: Start Time:SLP Start Time (ACUTE ONLY): 1050 Stop Time: SLP Stop Time (ACUTE ONLY): 1115 Time Calculation:SLP Time Calculation (min) (ACUTE ONLY): 25 min Charges: SLP Evaluations $ SLP Speech Visit: 1  Visit SLP Evaluations $MBS Swallow: 1 Procedure SLP visit diagnosis: SLP Visit Diagnosis: Dysphagia, oropharyngeal phase (R13.12) Past Medical History: Past Medical History: Diagnosis Date  Arthritic-like pain   CAD (coronary artery disease)   a. apical MI 5/07 with LHC showing 50% pCFX, 30% pRCA, 99% pLAD, 99% mLAD, 75% dLAD (cypher DES x3 to LAD); b. ETT-myoview (12/08) 81% MPHR,  EF 64%, partially reversible inferoapical perfusion defect similar to prior study 12/07; c. 04/2016 MV: EF 59%, fixed anteroapical defect, no ischemia->Low risk.  Carotid arterial disease (HCC)   a. 05/2016 Carotid U/S: 30-40% bilat dzs, f/u in 2 yrs.  Chest pain 04/13/2022  CVA (cerebral vascular accident) [facility])   a. 07/2010 - h/o CVA/TIA.  Diet-controlled type 2 diabetes mellitus (HCC)   HLD (hyperlipidemia)   HTN (hypertension)   PAT (paroxysmal atrial tachycardia)   a. 06/2016 Event monitor:  rare episodes of atrial tachycardia, longest 7 beats. No afib. Past Surgical History: Past Surgical History: Procedure Laterality Date  BACK SURGERY    CARDIAC CATHETERIZATION    CORONARY ANGIOPLASTY WITH STENT PLACEMENT    apical MI 5/07 with LHC showing 50% pCFX, 30% pRCA, 99% pLAD, 9%% mLAD, 75% dLAD. cypher DES x3 to LAD. ETT-myoview (12/08) 81% MPHR, 8'1, EF 64%, partially reversible inferoapical perfusion defect similar to prior study 12/07  CORONARY IMAGING/OCT N/A 04/14/2022  Procedure: INTRAVASCULAR IMAGING/OCT;  Surgeon: Yvonne Kendall, MD;  Location: [facility] INVASIVE CV LAB;  Service: Cardiovascular;  Laterality: N/A;  CORONARY STENT INTERVENTION N/A 04/14/2022  Procedure: CORONARY STENT INTERVENTION;  Surgeon: Yvonne Kendall, MD;  Location: [facility] INVASIVE CV LAB;  Service: Cardiovascular;  Laterality: N/A;  CORONARY STENT INTERVENTION Left 05/02/2022  Procedure: CORONARY STENT INTERVENTION;  Surgeon: Yvonne Kendall, MD;  Location: [facility] INVASIVE CV LAB;  Service: Cardiovascular;  Laterality: Left;  LEFT HEART CATH AND CORONARY ANGIOGRAPHY  N/A 04/13/2022  Procedure: LEFT HEART CATH AND CORONARY ANGIOGRAPHY;  Surgeon: Marykay Lex, MD;  Location: [facility] INVASIVE CV LAB;  Service: Cardiovascular;  Laterality: N/A; [name] [name] 09/20/2022, 12:53 PM  DG CHEST PORT 1 VIEW  Result Date: 09/20/2022 CLINICAL DATA:  Pneumonia EXAM: PORTABLE CHEST 1 VIEW COMPARISON:  09/10/2022 FINDINGS: Hazy right basilar airspace disease which may reflect pneumonia versus atelectasis. No pleural effusion or pneumothorax. Heart and mediastinal contours are unremarkable. Coronary artery atherosclerosis. No acute osseous abnormality. IMPRESSION: 1. Hazy right basilar airspace disease which may reflect pneumonia versus atelectasis. Electronically Signed   By: Elige Ko M.D.   On: 09/20/2022 09:21     Scheduled Meds:  amantadine  100 mg Oral Daily   amLODipine  10 mg Per Tube Daily   atorvastatin  80 mg Per Tube Daily   carvedilol  12.5 mg Per Tube BID WC   clopidogrel  75 mg Per Tube Daily   enoxaparin (LOVENOX) injection  40 mg Subcutaneous Q24H   Gerhardt's butt cream   Topical QID   lacosamide  50 mg Per Tube BID   lactose free nutrition  237 mL Oral BID BM   lidocaine  1 patch Transdermal Q24H   multivitamin with minerals  1 tablet Per Tube Daily   mouth rinse  15 mL Mouth Rinse 4 times per day   sertraline  25 mg Per Tube QPM   Continuous Infusions:  sodium chloride 10 mL/hr at 09/11/22 0226   sodium chloride 125 mL/hr at 09/21/22 1036   ampicillin-sulbactam (UNASYN) IV 3 g (09/21/22 0843)     LOS: 34 days   Time spent: 31  Rhetta Mura, MD [facility] To contact the attending provider between 7A-7P or the covering provider during after hours 7P-7A, please log into the web site [email or web address] and access using universal [place] password for that web site. If you do not have the password, please call the hospital operator.  09/21/2022, 4:28 PM

## 2022-09-21 NOTE — Progress Notes (Signed)
OT Cancellation Note  Patient Details Name: Raymond Sampson MRN: [id number] DOB: 06-Apr-1940   Cancelled Treatment:    Reason Eval/Treat Not Completed: Medical issues which prohibited therapy. WBC up and pt less responsive--per family the MD thinks pt is aspirating so they are stopping foods and starting fluids for now with wife moisturizing his mouth as he needs it.  [name] OT Acute Rehabilitation Services Office [phone]    [name] 09/21/2022, 10:45 AM

## 2022-09-21 NOTE — TOC Progression Note (Addendum)
Transition of Care [facility]) - Progression Note    Patient Details  Name: Raymond Sampson MRN: [id number] Date of Birth: [birth year]  Transition of Care [facility]) CM/SW Contact  Baldemar Lenis, [place] Phone Number: 09/21/2022, 11:19 AM  Clinical Narrative:   CSW received update from [facility] that the denial for SNF was upheld, with recommendation for consideration of hospice instead. CSW noting per chart review that patient is not doing as well, family meeting with palliative again to discuss options. CSW met with wife at bedside to provide update to her about denial being upheld. Wife indicated understanding, and said they are leaning towards hospice for the patient. Wife and daughter at bedside said some family disagree, so they are allowing them time to discuss their feelings, but it will not change their mind. Wife and daughter at bedside asked CSW to send referral to [facility] for hospice home placement in [place]. CSW spoke with [facility] to provide referral, they will review. CSW to follow.  UPDATE: CSW updated by [facility] liaison that bed will be available for patient tomorrow. CSW updated MD, will continue to follow.    Expected Discharge Plan: Hospice Medical Facility Barriers to Discharge: Insurance Authorization, Continued Medical Work up  Expected Discharge Plan and Services       Living arrangements for the past 2 months: Single Family Home                                       Social Determinants of Health (SDOH) Interventions SDOH Screenings   Food Insecurity: No Food Insecurity (08/29/2022)  Housing: Low Risk  (08/29/2022)  Transportation Needs: No Transportation Needs (08/29/2022)  Utilities: Not At Risk (08/29/2022)  Depression (PHQ2-9): Low Risk  (05/17/2022)  Tobacco Use: Medium Risk (08/18/2022)    Readmission Risk Interventions     No data to display

## 2022-09-21 NOTE — Progress Notes (Signed)
Nutrition Follow-up  DOCUMENTATION CODES:  Severe malnutrition in context of chronic illness  INTERVENTION:  Continue diet per SLP recommendation as pt desires Recommend discontinuing all nutrition supplements and MVI as they do not support pt comfort.   NUTRITION DIAGNOSIS:  Severe Malnutrition related to chronic illness (fall) as evidenced by severe fat depletion, severe muscle depletion. - remains applicable  GOAL:  Patient will meet greater than or equal to 90% of their needs - no longer appropriate, pt leaning towards more focused care  MONITOR:  PO intake, Other (Comment) (GOC)  REASON FOR ASSESSMENT:  Consult Enteral/tube feeding initiation and management  ASSESSMENT:  Pt with PMH of CAD, CVA 2012, DM, HLD, and HTN admitted with traumatic SDH.   5/21 - admit with seizures 5/22 s/p cortrak placement, per xray tip in gastric antrum  5/29 - SLP evaluation, NPO but pt allowed to have sips of honey thick liquids and puree from floor stock 6/4 - SLP continues to recommend NPO with sips of honey thick liquids 6/7 - MBS, DYS1 with honey thick; pt with aspiration of larger sips of honey thick liquids and with thin and nectar. 6/9-6/11: Kcal count, pt meeting ~50% of needs 6/12-6/14: kcal count, meeting 100% of needs   Reviewed chart and pt now with decreased alertness, lethargy, and signd of aspiration pneumonia. Pt denied for SNF, family appealed but per CM, continues to be denied.   Visited with pt and wife at bedside. Pt lethargic today and tray at bedside noted to be untouched. Wife reports that he is more lethargic today than yesterday but that he was able to tell her he was tired of fighting. PMT spoke with wife this AM and they have decided to pursue hospice home placement.   At this time, would recommend allow pt to consume foods and drinks as he expresses interest. Will discontinue boost plus and MVI as they do not add to pt's QOL at this juncture.   Nutritionally  Relevant Medications: Scheduled Meds:  amantadine  100 mg Oral Daily   atorvastatin  80 mg Per Tube Daily   Boost Plus  237 mL Oral BID BM   multivitamin with minerals  1 tablet Per Tube Daily   Continuous Infusions:  ampicillin-sulbactam (UNASYN) IV 3 g (09/21/22 0843)   PRN Meds: ondansetron  Labs Reviewed: BUN 59, creatinine 1.51 HgbA1c: 6.0%  NUTRITION - FOCUSED PHYSICAL EXAM: Flowsheet Row Most Recent Value  Orbital Region Severe depletion  Upper Arm Region Severe depletion  Thoracic and Lumbar Region Severe depletion  Buccal Region Severe depletion  Temple Region Severe depletion  Clavicle Bone Region Severe depletion  Clavicle and Acromion Bone Region Severe depletion  Scapular Bone Region Severe depletion  Dorsal Hand Severe depletion  Patellar Region Severe depletion  Anterior Thigh Region Severe depletion  Posterior Calf Region Severe depletion  Edema (RD Assessment) None  Hair Reviewed  Eyes Unable to assess  Mouth Reviewed  Skin Reviewed  [+ecchymosis]  Nails Reviewed   Diet Order:   Diet Order             DIET - DYS 1 Room service appropriate? No; Fluid consistency: Honey Thick  Diet effective now                   EDUCATION NEEDS:  Not appropriate for education at this time  Skin:  Skin Assessment: Reviewed RN Assessment (laceration: face) Skin tear - right coccyx (1 cm x 1 cm)  Last BM:  6/19 -  type 6  Height:  Ht Readings from Last 1 Encounters:  08/18/22 5\' 7"  (1.702 m)    Weight:  Wt Readings from Last 1 Encounters:  09/19/22 64.9 kg    BMI:  Body mass index is 22.4 kg/m.  Estimated Nutritional Needs:  Kcal:  1600-1800 kcal/d Protein:  80-100g/d Fluid:  >1.7 L/day    Greig Castilla, RD, LDN Clinical Dietitian RD pager # available in AMION  After hours/weekend pager # available in [facility]

## 2022-09-22 DIAGNOSIS — Z7189 Other specified counseling: Secondary | ICD-10-CM | POA: Diagnosis not present

## 2022-09-22 DIAGNOSIS — S065X0D Traumatic subdural hemorrhage without loss of consciousness, subsequent encounter: Secondary | ICD-10-CM | POA: Diagnosis not present

## 2022-09-22 DIAGNOSIS — Z515 Encounter for palliative care: Secondary | ICD-10-CM | POA: Diagnosis not present

## 2022-09-22 LAB — BASIC METABOLIC PANEL
Anion gap: 9 (ref 5–15)
BUN: 48 mg/dL — ABNORMAL HIGH (ref 8–23)
CO2: 26 mmol/L (ref 22–32)
Calcium: 8.6 mg/dL — ABNORMAL LOW (ref 8.9–10.3)
Chloride: 104 mmol/L (ref 98–111)
Creatinine, Ser: 1.05 mg/dL (ref 0.61–1.24)
GFR, Estimated: >60 mL/min (ref >60)
Glucose, Bld: 194 mg/dL — ABNORMAL HIGH (ref 70–99)
Potassium: 3.7 mmol/L (ref 3.5–5.1)
Sodium: 139 mmol/L (ref 135–145)

## 2022-09-22 LAB — CBC WITH DIFFERENTIAL/PLATELET
Abs Immature Granulocytes: 0.15 10*3/uL — ABNORMAL HIGH (ref 0.00–0.07)
Basophils Absolute: 0 10*3/uL (ref 0.0–0.1)
Basophils Relative: 0 %
Eosinophils Absolute: 0.1 10*3/uL (ref 0.0–0.5)
Eosinophils Relative: 0 %
HCT: 31.6 % — ABNORMAL LOW (ref 39.0–52.0)
Hemoglobin: 9.8 g/dL — ABNORMAL LOW (ref 13.0–17.0)
Immature Granulocytes: 1 %
Lymphocytes Relative: 7 %
Lymphs Abs: 1.2 10*3/uL (ref 0.7–4.0)
MCH: 29.7 pg (ref 26.0–34.0)
MCHC: 31 g/dL (ref 30.0–36.0)
MCV: 95.8 fL (ref 80.0–100.0)
Monocytes Absolute: 0.9 10*3/uL (ref 0.1–1.0)
Monocytes Relative: 5 %
Neutro Abs: 14.5 10*3/uL — ABNORMAL HIGH (ref 1.7–7.7)
Neutrophils Relative %: 87 %
Platelets: 261 10*3/uL (ref 150–400)
RBC: 3.3 MIL/uL — ABNORMAL LOW (ref 4.22–5.81)
RDW: 15.9 % — ABNORMAL HIGH (ref 11.5–15.5)
WBC: 16.9 10*3/uL — ABNORMAL HIGH (ref 4.0–10.5)
nRBC: 0 % (ref 0.0–0.2)

## 2022-09-22 MED ORDER — HALOPERIDOL LACTATE 5 MG/ML IJ SOLN: 0.5000 mg | INTRAMUSCULAR | Status: DC | PRN

## 2022-09-22 MED ORDER — GUAIFENESIN-DM 100-10 MG/5ML PO SYRP: 10.0000 mL | ORAL_SOLUTION | ORAL | 0 refills | Status: DC | PRN

## 2022-09-22 MED ORDER — GLYCOPYRROLATE 0.2 MG/ML IJ SOLN: 0.2000 mg | INTRAMUSCULAR | Status: DC | PRN

## 2022-09-22 MED ORDER — GERHARDT'S BUTT CREAM: 1.0000 | TOPICAL_CREAM | Freq: Four times a day (QID) | CUTANEOUS | Status: DC

## 2022-09-22 MED ORDER — LIDOCAINE 5 % EX PTCH: 1.0000 | MEDICATED_PATCH | CUTANEOUS | 0 refills | Status: DC

## 2022-09-22 MED ORDER — LORAZEPAM 2 MG/ML IJ SOLN: 1.0000 mg | INTRAMUSCULAR | 0 refills | Status: DC | PRN

## 2022-09-22 MED ORDER — AMOXICILLIN-POT CLAVULANATE 600-42.9 MG/5ML PO SUSR: 600.0000 mg | Freq: Two times a day (BID) | ORAL | 0 refills | Status: DC

## 2022-09-22 NOTE — Progress Notes (Signed)
Discharged and left via [facility]. Discharge packet given to [facility] to be given to facility

## 2022-09-22 NOTE — Progress Notes (Signed)
OT Cancellation Note and Discharge  Patient Details Name: Raymond Sampson MRN: [id number] DOB: 1941-02-01   Cancelled Treatment:    Reason Eval/Treat Not Completed: Other (comment). Pt now transitioning to Hospice care--will sign off.  [name] OT Acute Rehabilitation Services Office [phone]    [name] 09/22/2022, 7:09 AM

## 2022-09-22 NOTE — Progress Notes (Signed)
Civil engineer, contracting [facility]) Hospital Liaison Note  Bed offered and accepted for transfer to Hospice home. Unit RN please call report to [phone] prior to patient leaving the unit. Please send signed DNR and paperwork with patient.   Please leave all IV access and ports in place.   Please call with any questions or concerns. Thank you  Dionicio Stall, [facility] Liaison [phone]

## 2022-09-22 NOTE — Progress Notes (Signed)
Report called to [facility] nurse at [facility]

## 2022-09-22 NOTE — Discharge Summary (Signed)
Physician Discharge Summary  Raymond Sampson GEX:[id number] DOB: 15-Sep-1940 DOA: 08/18/2022  PCP: Gracelyn Nurse, MD  Admit date: 08/18/2022 Discharge date: 09/22/2022  Time spent: 45 minutes  Recommendations for Outpatient Follow-up:  Patient is transitioning to a hospice at [facility]  Discharge Diagnoses:  MAIN problem for hospitalization   Prior MI in 2017  SVT  CVA 2012  prior pulmonary nodule Falls resulting in rib fractures 06/2022 Accidental finding on this admission causing laceration and bleeding with intracranial bleed and traumatic R SDH   Please see below for itemized issues addressed in HOpsital- refer to other progress notes for clarity if needed  Discharge Condition: gaurded  Diet recommendation: hh  Filed Weights   09/10/22 0745 09/12/22 0454 09/19/22 0500  Weight: 65.4 kg 64.4 kg 64.9 kg    History of present illness:  82 year old white male apical MI 2007 stent X3, NSTEMI 1/ 2024, PCI DES CVA 2012 SVT Bilateral carotid artery disease Prior tobacco with bilateral claudication HLD HTN Prior pulmonary nodule Underwent staged PCI of left circumflex Fall resulting in right-sided rib fractures 8 through 12 on 06/19/2022 and hospitalized until 07/03/2022-he had an intramuscular hematoma at the time right-sided hemothorax General surgery recommended conservative management I-S flutter and was placed to skilled rehab   Patient was admitted from the ED on 5/17 with an accidental fall striking his head causing a laceration and bleeding--while in the ED he did have biphasic T waves with ST elevation in V2 V4 and T wave inversions which looked worse and-neurosurgery Dr. Johnsie Cancel was consulted as patient had fallen while on aspirin Plavix Cardiology was consulted--- he was minimally symptomatic and deferred surgery.  5/18 repeat CT head with stable SDH and SAH 5/20 developed intermittent focal seizures drowsy only intermittently following  commands-Keppra/Vimpat started 5/21 critical care consulted, palliative care consulted 5/23 transferred to hospitalist service on tube feeds via core track, long-term EEG without seizure neurology signed off 09/06/2018 for change in pupil size repeat head CT negative 09/09/2022 amantadine started per epileptologist because of somnolence on Keppra   Hospital Course:   Accidental fall-traumatic R SDH, SAH + midline shift while on ASA/Plavix  Neurosurgery--felt not a candidate for evacuation-ASA/Plavix resumed 5/28 Previous fall 06/2022 and high risk for further falls Sutures removed on 5/26   Likely recurrent pneumonia Long discussion with Wife on several days-note elevation of white count from 16-25 with associated AKI and increasing lethargy despite starting Unasyn on 6/19-- do think he continues to aspirate and we have elected after much discussion with family to not do any further diagnostic testing in terms of x-rays or lab work. understands the implication and gravity of his worsening and after discussion with family is agreeable to hospice facility placement   Partial focal seizures narrowed/discontinue based on hospice philosophy   HTN, CAD status post PCI with several stents previously - D/W Dr. Clifton James cardiology 09/20/2022-aspirin/plavix stopped after further consideration PTA was on lisinopril 20 every evening which has been held since admission   Acute blood loss anemia-status post 2 units PRBC-no further blood draws   Non-insulin-dependent DM TY 2 A1c 6.0 Insulin discontinued this hospitalization Stop all checks and coverage   Well-controlled HTN De-escalate off of amlodipine 10 daily, Coreg 12.5 twice daily once discharged to [facility] hosp[facility] on 6/21  Discharge Exam: Vitals:   09/21/22 1900 09/22/22 0303  BP: (!) 113/59 (!) 143/60  Pulse: 73 68  Resp:  16  Temp: (!) 97.5 F (36.4 C) 97.8 F (36.6 C)  SpO2: 96% 97%    Subj on day of d/c   Awake coherent and  more engaging Answers monosyllabically  General Exam on discharge  Eomi ncat no focal deficit Cta b no added sound no wheeze rales rhonchi Abd soft nt nd no rebound No le edema Neuro intact moving limbs well with some overall weakness  Discharge Instructions   Discharge Instructions     Diet - low sodium heart healthy   Complete by: As directed    Increase activity slowly   Complete by: As directed    No wound care   Complete by: As directed       Allergies as of 09/22/2022       Reactions   Metformin And Related Itching, Other (See Comments)   Erectile dysfunction, also    Simvastatin Rash        Medication List     STOP taking these medications    amLODipine 5 MG tablet Commonly known as: NORVASC   aspirin 81 MG tablet   atorvastatin 80 MG tablet Commonly known as: LIPITOR   clopidogrel 75 MG tablet Commonly known as: PLAVIX   cyanocobalamin 1000 MCG tablet   ENSURE PO   feeding supplement Liqd   lisinopril 20 MG tablet Commonly known as: ZESTRIL   metFORMIN 1000 MG tablet Commonly known as: GLUCOPHAGE   metoprolol tartrate 25 MG tablet Commonly known as: LOPRESSOR   oxyCODONE 5 MG immediate release tablet Commonly known as: Oxy IR/ROXICODONE   pantoprazole 20 MG tablet Commonly known as: PROTONIX   senna-docusate 8.6-50 MG tablet Commonly known as: Senokot-S   sitaGLIPtin 25 MG tablet Commonly known as: JANUVIA       TAKE these medications    amoxicillin-clavulanate 600-42.9 MG/5ML suspension Commonly known as: Augmentin ES-600 Take 5 mLs (600 mg total) by mouth 2 (two) times daily.   Gerhardt's butt cream Crea Apply 1 Application topically 4 (four) times daily.   glycopyrrolate 0.2 MG/ML injection Commonly known as: ROBINUL Inject 1 mL (0.2 mg total) into the vein every 4 (four) hours as needed (excessive secretions).   guaiFENesin-dextromethorphan 100-10 MG/5ML syrup Commonly known as: ROBITUSSIN DM Take 10 mLs by  mouth every 4 (four) hours as needed for cough.   haloperidol lactate 5 MG/ML injection Commonly known as: HALDOL Inject 0.1 mLs (0.5 mg total) into the vein every 4 (four) hours as needed (or delirium).   lidocaine 5 % Commonly known as: LIDODERM Place 1 patch onto the skin daily. Remove & Discard patch within 12 hours or as directed by MD   LORazepam 2 MG/ML injection Commonly known as: ATIVAN Inject 0.5 mLs (1 mg total) into the vein every 4 (four) hours as needed for seizure.   sertraline 25 MG tablet Commonly known as: ZOLOFT Take 25 mg by mouth every evening.       Allergies  Allergen Reactions   Metformin And Related Itching and Other (See Comments)    Erectile dysfunction, also    Simvastatin Rash      The results of significant diagnostics from this hospitalization (including imaging, microbiology, ancillary and laboratory) are listed below for reference.    Significant Diagnostic Studies: DG Swallowing Func-Speech Pathology  Result Date: 09/20/2022 Table formatting from the original result was not included. Modified Barium Swallow Study Patient Details Name: SPARSH CALLENS MRN: [id number] Date of Birth: 12/09/1940 Today's Date: 09/20/2022 HPI/PMH: HPI: 82 year old male who presented for fall. Hit forehead with laceration repair, no LOC, no facial fx, but  CT 5/18 revealed: "stable large, approximately 17 mm thick Right Subdural Hematoma, most pronounced along the anterior right frontal convexity. Stable small volume right hemisphere [facility]." Chest CT 5/17: "Emphysema. Right pleural effusion. Hazy upper lobe airspace disease is indeterminate for edema or pneumonia." Neuro change noted by RN around 1300 on 5/19 with workup revealing seizures, now controlled with medication. Pt with history of CAD, prior stroke, hypertension, hyperlipidemia, type 2 diabetes, chronic antiplatelet use with aspirin and Plavix. MBS revealed hardware from ACDF. Cortrak placed 5/22;ST f/u for dysphagia  tx/management. Repeat MBS recommended initiating a Dysphagia 1(puree)/Honey-thickened liquid diet.  ST f/u for dysphagia tx/management/education. Clinical Impression: Clinical Impression: Pt presents with ongoing oropharyngeal dysphagia with diffuse deficits related to oral control, timing of swallow onset, reduced pharyngeal squeeze and base-of tongue contraction.  Variability of swallow function is great, ranging from adequate airway protection with small sips of thin liquid to gross aspiration of same consistency.  Vallecular residue is severe with poor ability to clear with subsequent dry swallows.  Over time, all administered POs eventually spilled in trace amounts to the level of the vocal folds. Cough response to aspiration was inconsistent.  Postural adjustments were ineffective. After the study, imaging was shared with Mrs. Blanchfield and we had a long discussion about his slow progress and the large number of risk factors he has that may lead to an aspiration pna. The variability of his swallow function obscures an easy solution to his dysphagia. We agreed to continue dysphagia 1, honey thick liquids for now; allow sips of thin water between meals and after oral care to help meet hydration needs.  Mrs.Kumari recognizes that if her husband's quality of life is compromised by limiting his food/drink opportunities, she may decide to shift to comfort POs in the future. She knows she has access to palliative care if needed.  We discussed this plan with Dr. Mahala Menghini.  SLP will continue to follow while pt is admitted. Factors that may increase risk of adverse event in presence of aspiration [name] 2021): Factors that may increase risk of adverse event in presence of aspiration [name] 2021): Reduced cognitive function; Limited mobility; Frail or deconditioned; Dependence for feeding and/or oral hygiene; Weak cough; Aspiration of thick, dense, and/or acidic materials Recommendations/Plan:  Swallowing Evaluation Recommendations Swallowing Evaluation Recommendations Recommendations: PO diet PO Diet Recommendation: Dysphagia 1 (Pureed); Moderately thick liquids (Level 3, honey thick) Liquid Administration via: Spoon Medication Administration: Crushed with puree Supervision: Full assist for feeding Swallowing strategies  : Slow rate; Multiple dry swallows after each bite/sip Postural changes: Position pt fully upright for meals Oral care recommendations: Oral care BID (2x/day) Treatment Plan Treatment Plan Treatment recommendations: Therapy as outlined in treatment plan below Treatment frequency: Min 2x/week Treatment duration: 1 week Interventions: Patient/family education; Diet toleration management by SLP Recommendations Recommendations for follow up therapy are one component of a multi-disciplinary discharge planning process, led by the attending physician.  Recommendations may be updated based on patient status, additional functional criteria and insurance authorization. Assessment: Orofacial Exam: Orofacial Exam Oral Cavity: Oral Hygiene: WFL Oral Cavity - Dentition: Missing dentition Oral Motor/Sensory Function: Suspected cranial nerve impairment CN VII - Facial: Left motor impairment Anatomy: Anatomy: Presence of cervical hardware Boluses Administered: Boluses Administered Boluses Administered: Thin liquids (Level 0); Mildly thick liquids (Level 2, nectar thick); Moderately thick liquids (Level 3, honey thick)  Oral Impairment Domain: Oral Impairment Domain Lip Closure: Escape progressing to mid-chin Tongue control during bolus hold: Escape to lateral buccal  cavity/floor of mouth Bolus transport/lingual motion: Repetitive/disorganized tongue motion Oral residue: Trace residue lining oral structures Location of oral residue : Tongue Initiation of pharyngeal swallow : Pyriform sinuses  Pharyngeal Impairment Domain: Pharyngeal Impairment Domain Soft palate elevation: No bolus between soft palate  (SP)/pharyngeal wall (PW) Laryngeal elevation: Partial superior movement of thyroid cartilage/partial approximation of arytenoids to epiglottic petiole Anterior hyoid excursion: Partial anterior movement Epiglottic movement: Complete inversion Laryngeal vestibule closure: Incomplete, narrow column air/contrast in laryngeal vestibule Pharyngeal stripping wave : Present - diminished Pharyngeal contraction (A/P view only): N/A Pharyngoesophageal segment opening: Partial distention/partial duration, partial obstruction of flow Tongue base retraction: Wide column of contrast or air between tongue base and PPW Pharyngeal residue: Collection of residue within or on pharyngeal structures Location of pharyngeal residue: Valleculae; Pyriform sinuses  Esophageal Impairment Domain: Esophageal Impairment Domain Esophageal clearance upright position: -- (n/a) Pill: Pill Consistency administered: -- (nt) Penetration/Aspiration Scale Score: Penetration/Aspiration Scale Score 1.  Material does not enter airway: Thin liquids (Level 0) 3.  Material enters airway, remains ABOVE vocal cords and not ejected out: Mildly thick liquids (Level 2, nectar thick); Moderately thick liquids (Level 3, honey thick) 5.  Material enters airway, CONTACTS cords and not ejected out: Mildly thick liquids (Level 2, nectar thick); Moderately thick liquids (Level 3, honey thick) 7.  Material enters airway, passes BELOW cords and not ejected out despite cough attempt by patient: Thin liquids (Level 0) 8.  Material enters airway, passes BELOW cords without attempt by patient to eject out (silent aspiration) : Thin liquids (Level 0); Mildly thick liquids (Level 2, nectar thick); Moderately thick liquids (Level 3, honey thick) Compensatory Strategies: Compensatory Strategies Compensatory strategies: Yes Straw: Ineffective Ineffective Straw: Thin liquid (Level 0); Mildly thick liquid (Level 2, nectar thick) Chin tuck: Ineffective Left head turn: Ineffective  Right head turn: Ineffective   General Information: Caregiver present: No  Diet Prior to this Study: Dysphagia 1 (pureed); Moderately thick liquids (Level 3, honey thick)   Temperature : Febrile   No data recorded  Supplemental O2: None (Room air)   History of Recent Intubation: No  Behavior/Cognition: Alert; Cooperative Self-Feeding Abilities: Needs assist with self-feeding Baseline vocal quality/speech: Hypophonia/low volume Volitional Cough: Able to elicit Volitional Swallow: Able to elicit Exam Limitations: No limitations Goal Planning: Prognosis for improved oropharyngeal function: Fair Barriers to Reach Goals: Cognitive deficits; Time post onset; Severity of deficits No data recorded No data recorded Consulted and agree with results and recommendations: Family member/caregiver Pain: Pain Assessment Pain Assessment: No/denies pain End of Session: Start Time:SLP Start Time (ACUTE ONLY): 1050 Stop Time: SLP Stop Time (ACUTE ONLY): 1115 Time Calculation:SLP Time Calculation (min) (ACUTE ONLY): 25 min Charges: SLP Evaluations $ SLP Speech Visit: 1 Visit SLP Evaluations $MBS Swallow: 1 Procedure SLP visit diagnosis: SLP Visit Diagnosis: Dysphagia, oropharyngeal phase (R13.12) Past Medical History: Past Medical History: Diagnosis Date  Arthritic-like pain   CAD (coronary artery disease)   a. apical MI 5/07 with LHC showing 50% pCFX, 30% pRCA, 99% pLAD, 99% mLAD, 75% dLAD (cypher DES x3 to LAD); b. ETT-myoview (12/08) 81% MPHR,  EF 64%, partially reversible inferoapical perfusion defect similar to prior study 12/07; c. 04/2016 MV: EF 59%, fixed anteroapical defect, no ischemia->Low risk.  Carotid arterial disease (HCC)   a. 05/2016 Carotid U/S: 30-40% bilat dzs, f/u in 2 yrs.  Chest pain 04/13/2022  CVA (cerebral vascular accident) [facility])   a. 07/2010 - h/o CVA/TIA.  Diet-controlled type 2 diabetes mellitus (HCC)   HLD (hyperlipidemia)  HTN (hypertension)   PAT (paroxysmal atrial tachycardia)   a. 06/2016 Event monitor:   rare episodes of atrial tachycardia, longest 7 beats. No afib. Past Surgical History: Past Surgical History: Procedure Laterality Date  BACK SURGERY    CARDIAC CATHETERIZATION    CORONARY ANGIOPLASTY WITH STENT PLACEMENT    apical MI 5/07 with LHC showing 50% pCFX, 30% pRCA, 99% pLAD, 9%% mLAD, 75% dLAD. cypher DES x3 to LAD. ETT-myoview (12/08) 81% MPHR, 8'1, EF 64%, partially reversible inferoapical perfusion defect similar to prior study 12/07  CORONARY IMAGING/OCT N/A 04/14/2022  Procedure: INTRAVASCULAR IMAGING/OCT;  Surgeon: Yvonne Kendall, MD;  Location: [facility] INVASIVE CV LAB;  Service: Cardiovascular;  Laterality: N/A;  CORONARY STENT INTERVENTION N/A 04/14/2022  Procedure: CORONARY STENT INTERVENTION;  Surgeon: Yvonne Kendall, MD;  Location: [facility] INVASIVE CV LAB;  Service: Cardiovascular;  Laterality: N/A;  CORONARY STENT INTERVENTION Left 05/02/2022  Procedure: CORONARY STENT INTERVENTION;  Surgeon: Yvonne Kendall, MD;  Location: [facility] INVASIVE CV LAB;  Service: Cardiovascular;  Laterality: Left;  LEFT HEART CATH AND CORONARY ANGIOGRAPHY N/A 04/13/2022  Procedure: LEFT HEART CATH AND CORONARY ANGIOGRAPHY;  Surgeon: Marykay Lex, MD;  Location: [facility] INVASIVE CV LAB;  Service: Cardiovascular;  Laterality: N/A; Carolan Shiver 09/20/2022, 12:53 PM  DG Swallowing Func-Speech Pathology  Result Date: 09/20/2022 Late entry from 08/25/22 Modified Barium Swallow Study  Patient Details Name: ISSAK GOLEY MRN: [id number] Date of Birth: May 28, 1940  Today's Date: 08/25/2022  Modified Barium Swallow completed.  Full report located under Chart Review in the Imaging Section.  History of Present Illness 82 year old male who presented for fall. Hit forehead with laceration repair, no LOC, no facial fx, but CT 5/18 revealed: "stable large, approximately 17 mm thick Right Subdural Hematoma, most pronounced along the anterior right frontal convexity. Stable small volume right hemisphere [facility]."  Chest CT 5/17:  "Emphysema. Right pleural effusion. Hazy upper lobe  airspace disease is indeterminate for edema or pneumonia." Neuro change noted by RN around 1300 on 5/19 with workup revealing seizures, now controlled with medication. Pt with history of CAD, prior stroke, hypertension, hyperlipidemia, type 2 diabetes, chronic antiplatelet use with aspirin and Plavix. MBS revealed hardware from ACDF. Cortrak placed 5/22.   Clinical Impression Pt was lethargic during assessment, but performance appears to have marginally improved.  He presented with persisting delays and diminished mobility of all the pharyngeal structures, leading to one incident of trace aspiration of thin liquids to just below the level of the vocal folds and intermittent trace penetration of thins and nectars.  He had no cough response in reaction to trace aspiration event. There was vallecular and pyriform residue with all consistencies.  Solids were not offered due to his mental status. Given his fluctuating mental status, he is not ready to resume an oral diet. Recommend cortrak feedings continue through the weekend. When he is alert and responsive, staff may offer honey-thick liquids and applesauce/pudding from floor stock.  SLP will continue to follow for readiness for a PO diet. Factors that may increase risk of adverse event in presence of aspiration [name] 2021): Reduced cognitive function;Weak cough  Swallow Evaluation Recommendations Recommendations: NPO- may have honey-thick liquid and purees from floor stock when alert  Medication Administration: Via alternative means Supervision: Full assist for feeding Swallowing strategies  : Small bites/sips;Slow rate Postural changes: Position pt fully upright for meals Oral care recommendations: Oral care QID (4x/day);Oral care before ice chips/water     Amanda [name] [name], MA CCC/SLP Clinical Specialist -  Acute Care SLP Acute Rehabilitation Services Office number [phone]  [name]  [name] 08/25/2022,3:16 PM   DG CHEST PORT 1 VIEW  Result Date: 09/20/2022 CLINICAL DATA:  Pneumonia EXAM: PORTABLE CHEST 1 VIEW COMPARISON:  09/10/2022 FINDINGS: Hazy right basilar airspace disease which may reflect pneumonia versus atelectasis. No pleural effusion or pneumothorax. Heart and mediastinal contours are unremarkable. Coronary artery atherosclerosis. No acute osseous abnormality. IMPRESSION: 1. Hazy right basilar airspace disease which may reflect pneumonia versus atelectasis. Electronically Signed   By: Elige Ko M.D.   On: 09/20/2022 09:21   DG CHEST PORT 1 VIEW  Result Date: 09/10/2022 CLINICAL DATA:  Cough EXAM: PORTABLE CHEST 1 VIEW COMPARISON:  09/03/2022 x-ray FINDINGS: Enteric tube with tip extending beneath the diaphragm. Fixation hardware along the lower cervical spine at the edge of the imaging field. Minimal right basilar atelectasis. Smaller area in the left lung base as well. No separate consolidation, pneumothorax, effusion or edema. Normal cardiopericardial silhouette. IMPRESSION: Enteric tube in place. Mild basilar atelectasis. Electronically Signed   By: Karen Kays M.D.   On: 09/10/2022 19:56   DG Swallowing Func-Speech Pathology  Result Date: 09/08/2022 Table formatting from the original result was not included. Modified Barium Swallow Study Patient Details Name: LETROY VAZGUEZ MRN: [id number] Date of Birth: Jan 01, 1941 Today's Date: 09/08/2022 HPI/PMH: HPI: 82 year old male who presented for fall. Hit forehead with laceration repair, no LOC, no facial fx, but CT 5/18 revealed: "stable large, approximately 17 mm thick Right Subdural Hematoma, most pronounced along the anterior right frontal convexity. Stable small volume right hemisphere [facility] Chest CT 5/17: "Emphysema. Right pleural effusion. Hazy upper lobe airspace disease is indeterminate for edema or pneumonia." Neuro change noted by RN around 1300 on 5/19 with workup revealing seizures, now controlled with medication.  Pt with history of CAD, prior stroke, hypertension, hyperlipidemia, type 2 diabetes, chronic antiplatelet use with aspirin and Plavix. MBS revealed hardware from ACDF. Cortrak placed 5/22;ST f/u for dysphagia tx/management. Repeat MBS today for potential initiation of po's Clinical Impression: Clinical Impression: Pt continues to exhibit oropharyngeal dysphagia however improvements from prior studies and he will be able to initiate a modified diet. Orally he demonstrated reduced control, cohesion and mastication with lingual residue with solid texture that he expectorated. Improvements in laryngeal elevation and laryngeal closure present intermittently during the swallow resulted in inconsistent penetration/aspiration however majority of deficits were observed after the swallow. Residue from decreased tongue base retraction and pharyngeal stripping led to vallecular and pyriform sinus residue with residue spilling over interarytenoid space and aspirating during the second swallow with thin and nectar. Pt also appears to have a smaller pyriform sinus space. At times he sensed to produce throat clear however penetrates would return. Larger sips of honey thick was aspirated after and during the swallow that was prevented with smaller controllled sips honey thick. Residue with puree and honey thick was mild, moderate with solid. Esophageal sweep did not reveal significant findings. Recommend pt initiate puree, honey thick liquid via teaspoon, crush meds, swallow twice,  intermittent throat clear and eat when adequately alert with full supervision. Factors that may increase risk of adverse event in presence of aspiration [name] 2021): Factors that may increase risk of adverse event in presence of aspiration [name] 2021): Frail or deconditioned Recommendations/Plan: Swallowing Evaluation Recommendations Swallowing Evaluation Recommendations Recommendations: PO diet PO Diet Recommendation: Dysphagia 1  (Pureed); Moderately thick liquids (Level 3, honey thick) Liquid Administration via: Spoon Medication Administration: Via alternative means (or  crush) Supervision: Full assist for feeding Swallowing strategies  : Slow rate; Small bites/sips; Multiple dry swallows after each bite/sip; Hard cough after swallowing; Clear throat intermittently Postural changes: Position pt fully upright for meals Oral care recommendations: Oral care BID (2x/day) Treatment Plan Treatment Plan Treatment recommendations: Therapy as outlined in treatment plan below Follow-up recommendations: -- (TBD) Functional status assessment: Patient has had a recent decline in their functional status and demonstrates the ability to make significant improvements in function in a reasonable and predictable amount of time. Treatment frequency: Min 2x/week Treatment duration: 2 weeks Interventions: Patient/family education; Trials of upgraded texture/liquids; Diet toleration management by SLP; Compensatory techniques Recommendations Recommendations for follow up therapy are one component of a multi-disciplinary discharge planning process, led by the attending physician.  Recommendations may be updated based on patient status, additional functional criteria and insurance authorization. Assessment: Orofacial Exam: Orofacial Exam Oral Cavity: Oral Hygiene: WFL Oral Cavity - Dentition: Missing dentition Orofacial Anatomy: Other (comment) Oral Motor/Sensory Function: Suspected cranial nerve impairment CN VII - Facial: Left motor impairment Anatomy: Anatomy: Presence of cervical hardware Boluses Administered: Boluses Administered Boluses Administered: Thin liquids (Level 0); Mildly thick liquids (Level 2, nectar thick); Moderately thick liquids (Level 3, honey thick); Puree  Oral Impairment Domain: Oral Impairment Domain Lip Closure: Escape progressing to mid-chin Tongue control during bolus hold: Escape to lateral buccal cavity/floor of mouth Bolus  preparation/mastication: Disorganized chewing/mashing with solid pieces of bolus unchewed Bolus transport/lingual motion: Repetitive/disorganized tongue motion Oral residue: Trace residue lining oral structures Location of oral residue : Tongue (coating) Initiation of pharyngeal swallow : Valleculae  Pharyngeal Impairment Domain: Pharyngeal Impairment Domain Soft palate elevation: No bolus between soft palate (SP)/pharyngeal wall (PW) Laryngeal elevation: Complete superior movement of thyroid cartilage with complete approximation of arytenoids to epiglottic petiole Anterior hyoid excursion: Complete anterior movement Epiglottic movement: Complete inversion Laryngeal vestibule closure: Incomplete, narrow column air/contrast in laryngeal vestibule Pharyngeal stripping wave : Present - diminished Pharyngeal contraction (A/P view only): N/A Pharyngoesophageal segment opening: Complete distension and complete duration, no obstruction of flow Tongue base retraction: Trace column of contrast or air between tongue base and PPW Pharyngeal residue: Collection of residue within or on pharyngeal structures Location of pharyngeal residue: Valleculae; Pyriform sinuses  Esophageal Impairment Domain: Esophageal Impairment Domain Esophageal clearance upright position: Complete clearance, esophageal coating Pill: Esophageal Impairment Domain Esophageal clearance upright position: Complete clearance, esophageal coating Penetration/Aspiration Scale Score: Penetration/Aspiration Scale Score 1.  Material does not enter airway: Moderately thick liquids (Level 3, honey thick); Puree 3.  Material enters airway, remains ABOVE vocal cords and not ejected out: Mildly thick liquids (Level 2, nectar thick) 4.  Material enters airway, CONTACTS cords then ejected out: Thin liquids (Level 0) 5.  Material enters airway, CONTACTS cords and not ejected out: Mildly thick liquids (Level 2, nectar thick) 6.  Material enters airway, passes BELOW cords  then ejected out: Mildly thick liquids (Level 2, nectar thick) 8.  Material enters airway, passes BELOW cords without attempt by patient to eject out (silent aspiration) : Thin liquids (Level 0); Moderately thick liquids (Level 3, honey thick) Compensatory Strategies: Compensatory Strategies Compensatory strategies: Yes Straw: Ineffective Ineffective Straw: Thin liquid (Level 0); Mildly thick liquid (Level 2, nectar thick) Other(comment): Ineffective (throat clear) Ineffective Other(comment): Thin liquid (Level 0); Mildly thick liquid (Level 2, nectar thick)   General Information: Caregiver present: No  Diet Prior to this Study: NPO; Cortrak/Small bore NG tube; Other (Comment) (honey thick sips)   Temperature : Normal  Respiratory Status: WFL   Supplemental O2: None (Room air)   History of Recent Intubation: No  Behavior/Cognition: Alert; Cooperative; Pleasant mood; Requires cueing Self-Feeding Abilities: Needs assist with self-feeding Baseline vocal quality/speech: Hypophonia/low volume Volitional Cough: Able to elicit Volitional Swallow: Able to elicit Exam Limitations: Fatigue Goal Planning: Prognosis for improved oropharyngeal function: Good Barriers to Reach Goals: Cognitive deficits No data recorded Patient/Family Stated Goal: to eat grits Consulted and agree with results and recommendations: Patient; Family member/caregiver; Nurse Pain: Pain Assessment Pain Assessment: No/denies pain Pain Score: 5 Faces Pain Scale: 4 Breathing: 0 Negative Vocalization: 0 Facial Expression: 0 Body Language: 0 Consolability: 0 PAINAD Score: 0 Facial Expression: 0 Body Movements: 0 Muscle Tension: 0 Compliance with ventilator (intubated pts.): N/A Vocalization (extubated pts.): 0 CPOT Total: 0 Pain Location: generalized Pain Descriptors / Indicators: Guarding; Grimacing; Discomfort Pain Intervention(s): Limited activity within patient's tolerance; Monitored during session End of Session: Start Time:SLP Start Time (ACUTE ONLY):  [id number] Stop Time: SLP Stop Time (ACUTE ONLY): 0956 Time Calculation:SLP Time Calculation (min) (ACUTE ONLY): 20 min Charges: SLP Evaluations $ SLP Speech Visit: 1 Visit SLP Evaluations $BSS Swallow: 1 Procedure $MBS Swallow: 1 Procedure $Swallowing Treatment: 1 Procedure SLP visit diagnosis: SLP Visit Diagnosis: Dysphagia, oropharyngeal phase (R13.12) Past Medical History: Past Medical History: Diagnosis Date  Arthritic-like pain   CAD (coronary artery disease)   a. apical MI 5/07 with LHC showing 50% pCFX, 30% pRCA, 99% pLAD, 99% mLAD, 75% dLAD (cypher DES x3 to LAD); b. ETT-myoview (12/08) 81% MPHR,  EF 64%, partially reversible inferoapical perfusion defect similar to prior study 12/07; c. 04/2016 MV: EF 59%, fixed anteroapical defect, no ischemia->Low risk.  Carotid arterial disease (HCC)   a. 05/2016 Carotid U/S: 30-40% bilat dzs, f/u in 2 yrs.  Chest pain 04/13/2022  CVA (cerebral vascular accident) [facility])   a. 07/2010 - h/o CVA/TIA.  Diet-controlled type 2 diabetes mellitus (HCC)   HLD (hyperlipidemia)   HTN (hypertension)   PAT (paroxysmal atrial tachycardia)   a. 06/2016 Event monitor:  rare episodes of atrial tachycardia, longest 7 beats. No afib. Past Surgical History: Past Surgical History: Procedure Laterality Date  BACK SURGERY    CARDIAC CATHETERIZATION    CORONARY ANGIOPLASTY WITH STENT PLACEMENT    apical MI 5/07 with LHC showing 50% pCFX, 30% pRCA, 99% pLAD, 9%% mLAD, 75% dLAD. cypher DES x3 to LAD. ETT-myoview (12/08) 81% MPHR, 8'1, EF 64%, partially reversible inferoapical perfusion defect similar to prior study 12/07  CORONARY IMAGING/OCT N/A 04/14/2022  Procedure: INTRAVASCULAR IMAGING/OCT;  Surgeon: Yvonne Kendall, MD;  Location: [facility] INVASIVE CV LAB;  Service: Cardiovascular;  Laterality: N/A;  CORONARY STENT INTERVENTION N/A 04/14/2022  Procedure: CORONARY STENT INTERVENTION;  Surgeon: Yvonne Kendall, MD;  Location: [facility] INVASIVE CV LAB;  Service: Cardiovascular;  Laterality: N/A;  CORONARY STENT  INTERVENTION Left 05/02/2022  Procedure: CORONARY STENT INTERVENTION;  Surgeon: Yvonne Kendall, MD;  Location: [facility] INVASIVE CV LAB;  Service: Cardiovascular;  Laterality: Left;  LEFT HEART CATH AND CORONARY ANGIOGRAPHY N/A 04/13/2022  Procedure: LEFT HEART CATH AND CORONARY ANGIOGRAPHY;  Surgeon: Marykay Lex, MD;  Location: [facility] INVASIVE CV LAB;  Service: Cardiovascular;  Laterality: N/A; Royce Macadamia 09/08/2022, 11:00 AM  CT HEAD WO CONTRAST ( )  Result Date: 09/07/2022 CLINICAL DATA:  Subarachnoid hemorrhage EXAM: CT HEAD WITHOUT CONTRAST TECHNIQUE: Contiguous axial images were obtained from the base of the skull through the vertex without intravenous contrast. RADIATION DOSE REDUCTION: This exam was performed according to the departmental dose-optimization program  which includes automated exposure control, adjustment of the mA and/or kV according to patient size and/or use of iterative reconstruction technique. COMPARISON:  09/01/2022 FINDINGS: Brain: Unchanged appearance of intermediate attenuation right convexity subdural hematoma measuring up to 16 mm in thickness. Isodense left subdural collection, likely chronic hematoma, is also unchanged. Persistent mild mass effect on the right hemisphere without midline shift. No new site of hemorrhage. There is periventricular hypoattenuation compatible with chronic microvascular disease. Mild generalized volume loss. Old left occipital infarct with ex vacuo dilatation of the atrium of the left lateral ventricle. Vascular: Mild atherosclerosis at the skull base. Skull: Negative Sinuses/Orbits: Near complete opacification of the left maxillary sinus. Normal orbits. Other: None IMPRESSION: 1. Unchanged appearance of intermediate attenuation right convexity subdural hematoma measuring up to 16 mm in thickness. 2. Unchanged appearance of isodense left subdural collection, likely chronic hematoma. 3. Old left occipital infarct. Electronically Signed   By:  Deatra Robinson M.D.   On: 09/07/2022 00:55   DG CHEST PORT 1 VIEW  Result Date: 09/03/2022 CLINICAL DATA:  Cough. EXAM: PORTABLE CHEST 1 VIEW COMPARISON:  07/02/2018 FINDINGS: The heart size and mediastinal contours are within normal limits. Coronary artery stent noted. Feeding tube is seen extending into the stomach. Both lungs are clear. Two disconnected ports are noted in the right hemithorax. IMPRESSION: No active disease. Electronically Signed   By: Danae Orleans M.D.   On: 09/03/2022 09:54   Overnight EEG with video  Result Date: 09/02/2022 Charlsie Quest, MD     09/03/2022 10:25 AM Patient Name: KAIROS PANETTA MRN: [id number] Epilepsy Attending: Charlsie Quest Referring Physician/Provider: Jefferson Fuel, MD Duration: 09/01/2019 1715 to 09/02/2022 1715  Patient history: 82 y.o. male with history of CAD, stroke, diabetes, hyperlipidemia, hypertension and paroxysmal atrial tachycardia who presented originally after falling off of 1 step off his deck at home and having a head injury with a large right-sided subdural hematoma.  He was initially alert and oriented and mentally at his baseline, but yesterday around 1300, he became more drowsy and was only able to intermittently follow commands and intermittently answer questions. EEG to evaluate for seizure.  Level of alertness: Awake, asleep  AEDs during EEG study: LEV, LCM  Technical aspects: This EEG study was done with scalp electrodes positioned according to the 10-20 International system of electrode placement. Electrical activity was reviewed with band pass filter of 1-70Hz , sensitivity of 7 uV/mm, display speed of 39mm/sec with a 60Hz  notched filter applied as appropriate. EEG data were recorded continuously and digitally stored.  Video monitoring was available and reviewed as appropriate.  Description: The posterior dominant rhythm consists of 8-9 Hz activity of moderate voltage (25-35 uV) seen predominantly in posterior head regions, symmetric and  reactive to eye opening and eye closing. Sleep was characterized by vertex waves, sleep spindles (12 to 14 Hz), maximal frontocentral region.  EEG showed intermittent generalized and lateralized right hemisphere 3-7 theta-delta slowing. Hyperventilation and photic stimulation were not performed.    ABNORMALITY - Intermittent slow, generalized and lateralized right hemisphere  IMPRESSION: This study is suggestive of cortical dysfunction in right hemisphere likely secondary to underlying structural abnormality. Additionally there is mild diffuse encephalopathy. No seizures or definite epileptiform discharges were noted.  Priyanka Annabelle Harman  CT HEAD WO CONTRAST ( )  Result Date: 09/01/2022 CLINICAL DATA:  Mental status change, unknown cause EXAM: CT HEAD WITHOUT CONTRAST TECHNIQUE: Contiguous axial images were obtained from the base of the skull through the vertex without  intravenous contrast. RADIATION DOSE REDUCTION: This exam was performed according to the departmental dose-optimization program which includes automated exposure control, adjustment of the mA and/or kV according to patient size and/or use of iterative reconstruction technique. COMPARISON:  CT Aug 21, 2022. FINDINGS: Brain: Unchanged size of a 1.6 cm thickness recent right subdural hematoma which is mildly decreased in attenuation. Similar mass effect with trace leftward midline shift. Similar smaller basal attenuating left subdural collection. No evidence of new/interval hemorrhage. No evidence of acute large vascular territory infarct. Similar remote left occipital infarct. Vascular: No hyperdense vessel identified. Skull: No acute fracture. Sinuses/Orbits: Left maxillary sinus frothy secretions with mucosal thickening. No acute orbital findings. Other: No mastoid effusions. IMPRESSION: 1. Unchanged size of a 1.6 cm thickness recent right subdural hematoma which is mildly decreased in attenuation. Similar mass effect with trace leftward midline  shift. 2. Similar smaller isoattenuating left subdural collection. 3. Remote left occipital infarct. Electronically Signed   By: Feliberto Harts M.D.   On: 09/01/2022 13:07   DG HIP UNILAT WITH PELVIS 2-3 VIEWS LEFT  Result Date: 08/24/2022 CLINICAL DATA:  Left hip pain.  Fall. EXAM: DG HIP (WITH OR WITHOUT PELVIS) 2-3V LEFT COMPARISON:  None Available. FINDINGS: Slightly obliqued view of the pelvis and frontal and lateral views of the left hip. Mildly decreased bone mineralization. Mild bilateral superior femoroacetabular joint space narrowing. Mild left femoral head-neck junction degenerative osteophytes. Within limitations of obliquity of the frontal view of the pelvis, no acute fracture is seen. No dislocation. There appears to be degenerative ankylosis of the bilateral sacroiliac joints and the pubic symphysis. Severe degenerative disc changes at L4-5 and L5-S1. IMPRESSION: 1. No acute fracture. 2. Mild left-greater-than-right femoroacetabular osteoarthritis. Electronically Signed   By: Neita Garnet M.D.   On: 08/24/2022 13:23   DG Abd Portable 1V  Result Date: 08/23/2022 CLINICAL DATA:  Feeding tube placement. EXAM: PORTABLE ABDOMEN - 1 VIEW COMPARISON:  None Available. FINDINGS: Feeding tube tip in the gastric antrum. The bowel gas pattern is normal. Oral contrast in the right colon. No radio-opaque calculi or other significant radiographic abnormality are seen. IMPRESSION: 1. Feeding tube tip in the gastric antrum. Electronically Signed   By: Obie Dredge M.D.   On: 08/23/2022 12:46    Microbiology: No results found for this or any previous visit (from the past 240 hour(s)).   Labs: Basic Metabolic Panel: Recent Labs  Lab 09/16/22 0305 09/18/22 0341 09/20/22 0203 09/21/22 0354 09/22/22 0500  NA 136 138 137 137 139  K 4.1 4.2 4.1 3.9 3.7  CL 98 100 100 101 104  CO2 26 25 26 24 26   GLUCOSE 124* 101* 120* 227* 194*  BUN 38* 40* 43* 59* 48*  CREATININE 0.89 1.04 1.04 1.51* 1.05   CALCIUM 8.8* 8.9 9.1 8.8* 8.6*   Liver Function Tests: No results for input(s): "AST", "ALT", "ALKPHOS", "BILITOT", "PROT", "ALBUMIN" in the last 168 hours. No results for input(s): "LIPASE", "AMYLASE" in the last 168 hours. No results for input(s): "AMMONIA" in the last 168 hours. CBC: Recent Labs  Lab 09/16/22 0305 09/18/22 0341 09/20/22 0203 09/21/22 0354 09/22/22 0500  WBC 9.4 10.1 16.3* 27.5* 16.9*  NEUTROABS  --   --   --  23.8* 14.5*  HGB 10.6* 10.9* 11.9* 10.4* 9.8*  HCT 32.9* 34.1* 37.4* 33.0* 31.6*  MCV 91.4 95.8 93.7 94.6 95.8  PLT 366 350 361 288 261   Cardiac Enzymes: No results for input(s): "CKTOTAL", "CKMB", "CKMBINDEX", "TROPONINI" in the  last 168 hours. BNP: BNP (last 3 results) No results for input(s): "BNP" in the last 8760 hours.  ProBNP (last 3 results) No results for input(s): "PROBNP" in the last 8760 hours.  CBG: Recent Labs  Lab 09/18/22 1638 09/18/22 2127 09/19/22 0410 09/19/22 0614 09/19/22 1146  GLUCAP 134* 222* 114* 118* 179*       Signed:  Rhetta Mura MD   [facility] 09/22/2022, 7:41 AM

## 2022-09-22 NOTE — Progress Notes (Signed)
PT Cancellation Note  Patient Details Name: Raymond Sampson MRN: [id number] DOB: 1941/03/06   Cancelled Treatment:    Reason Eval/Treat Not Completed: (P) Other (comment) (Pt now under hospice care informed RN and NP that order was still showing in epic and needs to be cancelled.  Will defer tx at this time)   Raymond Sampson 09/22/2022, 11:33 AM  Raymond Sampson , PTA Acute Rehabilitation Services Office [phone]

## 2022-09-22 NOTE — Progress Notes (Signed)
   Palliative Medicine Inpatient Follow Up Note HPI: 82 y.o. male with past medical history of CAD, stroke, DM2, HTN, HLD admitted on 08/18/2022 after a fall at his home with a large right-sided subdural hematoma. Transitioned to comfort care on 6/20.   Today's Discussion 09/22/2022  *Please note that this is a verbal dictation therefore any spelling or grammatical errors are due to the "[employer]agon [employer] One" system interpretation.  Chart reviewed inclusive of vital signs, progress notes, laboratory results, and diagnostic images. Has not required any ativan IV in the last 24 hours.   I met with Teryl and his spouse, [name] at bedside this morning. [name] shares that she has spoken to McGuffey and updated him on the present process in terms of transitioning to inpatient hospice. I spoke to Beasley and verified he had understanding. He denies pain, dyspnea, or nausea this morning. He is aware of person and place as well as the next steps regarding his plan of care.   Created space and opportunity for patient to explore thoughts feelings and fears regarding current medical situation.  Questions and concerns addressed/Palliative Support Provided.   Objective Assessment: Vital Signs Vitals:   09/22/22 0303 09/22/22 0935  BP: (!) 143/60 (!) 143/59  Pulse: 68 66  Resp: 16 16  Temp: 97.8 F (36.6 C) 98.3 F (36.8 C)  SpO2: 97% 97%    Intake/Output Summary (Last 24 hours) at 09/22/2022 1478 Last data filed at 09/22/2022 2956 Gross per 24 hour  Intake 3 ml  Output 950 ml  Net -947 ml   Last Weight  Most recent update: 09/19/2022  7:24 AM    Weight  64.9 kg (143 lb)            Gen: Elderly M in NAD HEENT: moist mucous membranes CV: Regular rate and rhythm  PULM: On RA, breathing is even and nonlabored ABD: soft/nontender  EXT: No edema  Neuro: Alert and oriented x2  SUMMARY OF RECOMMENDATIONS   DNAR/DNI  Comfort Care  Plan for transition to hospice home this morning  Billing  based on MDM: High - review of IV controlled medications  _____________________________________________________________________________________ Raymond Sampson [place] Palliative Medicine Team Team Cell Phone: [phone] Please utilize secure chat with additional questions, if there is no response within 30 minutes please call the above phone number  Palliative Medicine Team providers are available by phone from 7am to 7pm daily and can be reached through the team cell phone.  Should this patient require assistance outside of these hours, please call the patient's attending physician.

## 2022-10-10 ENCOUNTER — Other Ambulatory Visit: Payer: Self-pay | Admitting: Cardiovascular Disease

## 2022-10-31 ENCOUNTER — Ambulatory Visit: Payer: PPO | Admitting: Physician Assistant

## 2022-11-16 ENCOUNTER — Other Ambulatory Visit: Payer: Self-pay | Admitting: Cardiovascular Disease

## 2022-11-29 ENCOUNTER — Telehealth: Payer: Self-pay | Admitting: Physician Assistant

## 2022-11-29 NOTE — Telephone Encounter (Signed)
   Pt c/o medication issue:  1. Name of Medication: metoprolol   2. How are you currently taking this medication (dosage and times per day)?   3. Are you having a reaction (difficulty breathing--STAT)?   4. What is your medication issue? Pt's wife calling, she is asking if pt can be put back on metoprolol because his BP is getting elevated, most recent BP 142/70, she said, pt is in hospice care and looking forward to his appt on Tuesday 09/03 with Eula Listen. However, appt was canceled. She said she can be contacted at [phone]. She wants the appt back on Tuesday

## 2022-11-29 NOTE — Telephone Encounter (Signed)
The patient's wife called in concerned that the patient's appointment that was scheduled for 9/3 with Eula Listen, PA was canceled. She stated that she confirmed the appointment yesterday and then a few minutes later the appointment was cancelled due to the patient being "deceased".   She stated that the patient is not in hospice and she would like the appointment back. She specifically made that appointment due to the patient having an appointment with PCP in the afternoon. It is hard for her to get the patient out of the house.

## 2022-11-29 NOTE — Telephone Encounter (Signed)
The patient's wife has been made aware that the appointment has been added back.  The wife stated that the patient had been taken off of all his medications when placed in Hospice. He was previously on Metoprolol 25 mg bid. She stated that his blood pressures have been running from the 120's to 150's systolic.  The patient has been asymptomatic.   She has been advised to keep a log of the blood pressures and heart rate and bring them to the appointment on 9/3.

## 2022-12-05 ENCOUNTER — Encounter: Payer: Self-pay | Admitting: Physician Assistant

## 2022-12-05 ENCOUNTER — Ambulatory Visit: Payer: PPO | Attending: Physician Assistant | Admitting: Physician Assistant

## 2022-12-05 ENCOUNTER — Ambulatory Visit: Payer: PPO | Admitting: Physician Assistant

## 2022-12-05 VITALS — BP 160/78 | HR 84 | Ht 67.0 in | Wt 137.0 lb

## 2022-12-05 DIAGNOSIS — I4719 Other supraventricular tachycardia: Secondary | ICD-10-CM

## 2022-12-05 DIAGNOSIS — R911 Solitary pulmonary nodule: Secondary | ICD-10-CM

## 2022-12-05 DIAGNOSIS — I609 Nontraumatic subarachnoid hemorrhage, unspecified: Secondary | ICD-10-CM

## 2022-12-05 DIAGNOSIS — I6523 Occlusion and stenosis of bilateral carotid arteries: Secondary | ICD-10-CM

## 2022-12-05 DIAGNOSIS — I2510 Atherosclerotic heart disease of native coronary artery without angina pectoris: Secondary | ICD-10-CM

## 2022-12-05 DIAGNOSIS — I10 Essential (primary) hypertension: Secondary | ICD-10-CM | POA: Diagnosis not present

## 2022-12-05 DIAGNOSIS — S065XAD Traumatic subdural hemorrhage with loss of consciousness status unknown, subsequent encounter: Secondary | ICD-10-CM

## 2022-12-05 DIAGNOSIS — I739 Peripheral vascular disease, unspecified: Secondary | ICD-10-CM

## 2022-12-05 DIAGNOSIS — S065XAA Traumatic subdural hemorrhage with loss of consciousness status unknown, initial encounter: Secondary | ICD-10-CM

## 2022-12-05 DIAGNOSIS — Z8673 Personal history of transient ischemic attack (TIA), and cerebral infarction without residual deficits: Secondary | ICD-10-CM

## 2022-12-05 DIAGNOSIS — D649 Anemia, unspecified: Secondary | ICD-10-CM

## 2022-12-05 DIAGNOSIS — E785 Hyperlipidemia, unspecified: Secondary | ICD-10-CM

## 2022-12-05 MED ORDER — METOPROLOL SUCCINATE ER 25 MG PO TB24: 25.0000 mg | ORAL_TABLET | Freq: Every day | ORAL | 3 refills | Status: DC

## 2022-12-05 NOTE — Patient Instructions (Signed)
Medication Instructions:  Your physician recommends the following medication changes.  STOP TAKING: Plavix  START TAKING: Toprol XL 25 mg daily *If you need a refill on your cardiac medications before your next appointment, please call your pharmacy*  Lab Work: None If you have labs (blood work) drawn today and your tests are completely normal, you will receive your results only by: MyChart Message (if you have MyChart) OR A paper copy in the mail If you have any lab test that is abnormal or we need to change your treatment, we will call you to review the results.  Testing/Procedures: None  Follow-Up: At [facility], you and your health needs are our priority.  As part of our continuing mission to provide you with exceptional heart care, we have created designated Provider Care Teams.  These Care Teams include your primary Cardiologist (physician) and Advanced Practice Providers (APPs -  Physician Assistants and Nurse Practitioners) who all work together to provide you with the care you need, when you need it.  We recommend signing up for the patient portal called "MyChart".  Sign up information is provided on this After Visit Summary.  MyChart is used to connect with patients for Virtual Visits (Telemedicine).  Patients are able to view lab/test results, encounter notes, upcoming appointments, etc.  Non-urgent messages can be sent to your provider as well.   To learn more about what you can do with MyChart, go to [email or web address].    Your next appointment:   4-6 weeks to adjust the Toprol XL  Provider:   You may see Julien Nordmann, MD or one of the following Advanced Practice Providers on your designated Care Team:   Eula Listen, [place]

## 2022-12-05 NOTE — Progress Notes (Signed)
Cardiology Office Note    Date:  12/05/2022   ID:  Raymond Sampson, DOB Aug 01, 1940, MRN [id number]  PCP:  Gracelyn Nurse, MD  Cardiologist:  Julien Nordmann, MD  Electrophysiologist:  None   Chief Complaint: Follow up  History of Present Illness:   Raymond Sampson Sampson a 82 y.o. male with history of CAD with apical MI in 2007 status post stenting x 3 to Raymond LAD with NSTEMI in 04/2022 status post PCI and DES to Raymond LAD and RCA with staged PCI to Raymond LCx, TIA/CVA in 2012, atrial tachycardia, bilateral carotid artery disease, dementia, PAD, DM2, HTN, HLD, and prior tobacco use who presents for follow-up of CAD.   Raymond Sampson was admitted  to [facility] hospital in 08/2005 with an MI that was treated with PCI/DES x 3 to Raymond LAD. Stress test in 2013 showed no significant ischemia, EF 68%, with severely elevated BP. Echo in 2014 showed an EF of 60-65%, diastolic dysfunction, normal RVSF and cavity size, normal RVSP. Raymond Sampson was seen in early 2018 with palpitations and abnormal EKG leading to a Myoview in 04/2016 that was low risk. Outpatient cardiac monitoring in 05/2016 showed NSR with an average heart rate of 69 bpm (40-171 bpm), 7 atrial tachycardic runs occurred with Raymond longest lasting 7 beats with an average rate of 105 bpm with no evidence of Afib.  Most recent carotid artery ultrasound from 12/2018 showed 1 to 39% bilateral ICA stenosis with bilateral vertebral arteries demonstrating antegrade flow and normal flow hemodynamics in Raymond bilateral subclavian arteries.   Raymond Sampson was admitted to [facility] in 04/2022 with an NSTEMI.  High-sensitivity troponin peaked at 1422.  CTA of Raymond chest showed no evidence of PE with an incidental nodular density noted in Raymond left lung base as well as mild right hilar lymphadenopathy, right adrenal adenoma, and aortic atherosclerosis.  Echo showed an EF of 55 to 60%, mild LVH, hypokinesis of Raymond basal septal region, grade 1 diastolic dysfunction, normal RV systolic function and ventricular cavity  size, normal PASP, aortic valve sclerosis without evidence of stenosis, and normal estimated right atrial pressure.  LHC on 04/13/2022 showed severe three-vessel CAD as outlined below.  After further discussion, Raymond Sampson was not felt to be a CABG candidate given advanced age and dementia with recommendation to proceed with complex PCI intervention.  Raymond Sampson subsequently underwent repeat LHC on 04/14/2022 that again showed severe three-vessel CAD.  Raymond Sampson underwent successful PCI/DES to Raymond proximal LAD, PCI/DES to Raymond proximal RCA, and PCI/DES to in-stent restenosis involving Raymond long segment of mid/distal LAD stenting.  Raymond entire stent was angioplastied and Onyx Frontier 2.0 x 18 mm drug-eluting stent was placed at Raymond distal edge leading to reduction of stenosis from 70% to 20 to 30% in Raymond proximal and mid portions with 0% residual stenosis at Raymond distal edge and TIMI-3 flow throughout Raymond vessel.  Following these interventions, Raymond Sampson remained chest pain-free with family preferring Raymond Sampson be discharged with planned staged PCI to Raymond LCx be pursued in Raymond outpatient setting.   Raymond Sampson was seen in hospital follow-up on 04/26/2022 and was without symptoms of angina or cardiac decompensation.  Raymond Sampson was adherent to cardiac medications.  No falls or hematochezia/melena.  Raymond Sampson did report a 2-day history of dark urine following Raymond Sampson hospital discharge that subsequently resolved without intervention and had not recurred.  Raymond Sampson and Raymond Sampson remain interested in moving forward with staged PCI of Raymond LCx.  Raymond Sampson underwent staged PCI of Raymond LCx  on 05/02/2022 with procedure notable for a small hematoma that remained stable and did not require treatment beyond manual compression.  Post-procedural hemoglobin was mildly low, though stable on repeat at 10.9.  Raymond Sampson subsequently enrolled in cardiac rehab.  Carotid artery ultrasound from 06/2022 showed 1 to 39% bilateral ICA stenosis with antegrade flow of Raymond bilateral vertebral arteries and normal flow  hemodynamics of Raymond bilateral subclavian arteries.  ABI in 06/2022 showed 0.95 on Raymond right and 0.65 on Raymond left.  Duplex on Raymond right showed borderline significant popliteal artery stenosis.  On Raymond left, there was moderate stenosis affecting Raymond common femoral artery with severe stenosis in Raymond distal SFA.  Raymond Sampson was admitted to [facility] hospital in 06/2022 after sustaining a mechanical fall, slipping on soap in Raymond bathtub.  Workup showed right sided rib fractures 8-12, intramural hematoma within Raymond paraspinal neck musculature, small soft right sided hemothorax, and acute blood loss anemia status post 1 unit PRBC.  Plavix was initially held, then resumed.  During admission Raymond Sampson did have chest pain with troponin peaking at 4936.  Conservative management was recommended.  Raymond Sampson was evaluated by Dr. Kirke Corin for PAD in 07/2022 with recommendation for continued medical therapy given Raymond Sampson was without claudication or critical limb ischemia.  Raymond Sampson was admitted in 08/2022 after sustaining a mechanical fall, striking Raymond Sampson head.  Imaging notable for SDH and SAH with midline shift.  Neurosurgery felt Raymond Sampson was not a candidate for evacuation.  Admission also notable for recurrent pneumonia, acute blood loss anemia requiring 2 units PRBC, and partial focal seizures.  Discharge summary indicates aspirin and Plavix were discontinued by interventional cardiology after further consideration.  Raymond Sampson was discharged to hospice facility, though has subsequently graduated from hospice.  Raymond Sampson comes in accompanied by Raymond Sampson today who assists with Raymond history.  Both Raymond Sampson and Raymond Sampson indicate Raymond Sampson has done well from a cardiac perspective, without symptoms of angina or cardiac decompensation.  Raymond Sampson denies significant dyspnea, palpitations, dizziness, presyncope, or syncope.  She indicates Raymond Sampson has told her this Sampson Raymond best Raymond Sampson has felt in quite some time.  Raymond Sampson has been continued on clopidogrel and aspirin since entering into hospice.  Appetite Sampson  picking back up.  Remains off metoprolol and atorvastatin.  Has follow-up with PCP later today.   Labs independently reviewed: 09/2022 - Hgb 9.8, PLT 261, potassium 3.7, BUN 40, serum creatinine 1.05, magnesium 2.1, albumin 2.2, AST/ALT normal 08/2022 - TSH normal, TC 116, TG 127, HDL 37, LDL 53, A1c 6.0  Past Medical History:  Diagnosis Date   Arthritic-like pain    CAD (coronary artery disease)    a. apical MI 5/07 with LHC showing 50% pCFX, 30% pRCA, 99% pLAD, 99% mLAD, 75% dLAD (cypher DES x3 to LAD); b. ETT-myoview (12/08) 81% MPHR,  EF 64%, partially reversible inferoapical perfusion defect similar to prior study 12/07; c. 04/2016 MV: EF 59%, fixed anteroapical defect, no ischemia->Low risk.   Carotid arterial disease (HCC)    a. 05/2016 Carotid U/S: 30-40% bilat dzs, f/u in 2 yrs.   Chest pain 04/13/2022   CVA (cerebral vascular accident) [facility])    a. 07/2010 - h/o CVA/TIA.   Diet-controlled type 2 diabetes mellitus (HCC)    HLD (hyperlipidemia)    HTN (hypertension)    PAT (paroxysmal atrial tachycardia)    a. 06/2016 Event monitor:  rare episodes of atrial tachycardia, longest 7 beats. No afib.    Past Surgical History:  Procedure Laterality Date  BACK SURGERY     CARDIAC CATHETERIZATION     CORONARY ANGIOPLASTY WITH STENT PLACEMENT     apical MI 5/07 with LHC showing 50% pCFX, 30% pRCA, 99% pLAD, 9%% mLAD, 75% dLAD. cypher DES x3 to LAD. ETT-myoview (12/08) 81% MPHR, 8'1, EF 64%, partially reversible inferoapical perfusion defect similar to prior study 12/07   CORONARY IMAGING/OCT N/A 04/14/2022   Procedure: INTRAVASCULAR IMAGING/OCT;  Surgeon: Yvonne Kendall, MD;  Location: [facility] INVASIVE CV LAB;  Service: Cardiovascular;  Laterality: N/A;   CORONARY STENT INTERVENTION N/A 04/14/2022   Procedure: CORONARY STENT INTERVENTION;  Surgeon: Yvonne Kendall, MD;  Location: [facility] INVASIVE CV LAB;  Service: Cardiovascular;  Laterality: N/A;   CORONARY STENT INTERVENTION Left 05/02/2022    Procedure: CORONARY STENT INTERVENTION;  Surgeon: Yvonne Kendall, MD;  Location: [facility] INVASIVE CV LAB;  Service: Cardiovascular;  Laterality: Left;   LEFT HEART CATH AND CORONARY ANGIOGRAPHY N/A 04/13/2022   Procedure: LEFT HEART CATH AND CORONARY ANGIOGRAPHY;  Surgeon: Marykay Lex, MD;  Location: [facility] INVASIVE CV LAB;  Service: Cardiovascular;  Laterality: N/A;    Current Medications: Current Meds  Medication Sig   aspirin EC 81 MG tablet Take 81 mg by mouth daily. Swallow whole.   cyanocobalamin 1000 MCG tablet Take 1,000 mcg by mouth daily.   LORazepam (ATIVAN) 2 MG/ML injection Inject 0.5 mLs (1 mg total) into Raymond vein every 4 (four) hours as needed for seizure.   metFORMIN (GLUCOPHAGE) 1000 MG tablet Take 1,000 mg by mouth 2 (two) times daily.   metoprolol succinate (TOPROL XL) 25 MG 24 hr tablet Take 1 tablet (25 mg total) by mouth daily.   Multiple Vitamins-Minerals (CENTRUM SILVER 50+MEN PO) Take by mouth.   Nystatin (GERHARDT'S BUTT CREAM) CREA Apply 1 Application topically 4 (four) times daily.   sertraline (ZOLOFT) 25 MG tablet Take 25 mg by mouth every evening.   [DISCONTINUED] clopidogrel (PLAVIX) 75 MG tablet Take 75 mg by mouth daily.    Allergies:   Metformin and related and Simvastatin   Social History   Socioeconomic History   Marital status: Married    Spouse name: Not on file   Number of children: Not on file   Years of education: Not on file   Highest education level: Not on file  Occupational History   Occupation: retired  Tobacco Use   Smoking status: Former    Current packs/day: 0.00    Average packs/day: 1 pack/day for 25.0 years (25.0 ttl pk-yrs)    Types: Cigarettes    Start date: 04/17/1960    Quit date: 04/17/1985    Years since quitting: 37.6   Smokeless tobacco: Never  Vaping Use   Vaping status: Never Used  Substance and Sexual Activity   Alcohol use: No   Drug use: No   Sexual activity: Not on file  Other Topics Concern   Not on file   Social History Narrative   Part time work at [employer]. Does not regularly exercise.    Social Determinants of Health   Financial Resource Strain: Not on file  Food Insecurity: No Food Insecurity (08/29/2022)   Hunger Vital Sign    Worried About Running Out of Food in Raymond Last Year: Never true    Ran Out of Food in Raymond Last Year: Never true  Transportation Needs: No Transportation Needs (08/29/2022)   PRAPARE - Administrator, Civil Service (Medical): No    Lack of Transportation (Non-Medical): No  Physical Activity: Not on  file  Stress: Not on file  Social Connections: Not on file     Family History:  Raymond Sampson's Family history Sampson unknown by Raymond Sampson.  ROS:   12-point review of systems Sampson negative unless otherwise noted in Raymond HPI.   EKGs/Labs/Other Studies Reviewed:    Studies reviewed were summarized above. Raymond additional studies were reviewed today:  LHC 05/02/2022: Conclusions: Patent LAD stents with stable mild in-stent restenosis of remotely placed mid-distal LAD stents, unchanged from completion of last catheterization.  Recently placed proximal LAD stent Sampson widely patent. Severe LCx/OM2 disease, similar to prior catheterizations. Mildly elevated left ventricular filling pressure. Successful complex bifurcation PCI to LCx/OM2 using [place] technique (Onyx frontier 2.75 x 30 mm main branch and 2.5 x 15 mm side branch drug-eluting stents) with 0% residual stenosis and TIMI-3 flow.   Recommendations: Continue dual antiplatelet therapy with aspirin and clopidogrel for at least 12 months. Aggressive secondary prevention. Overnight observation. ___________  [facility] 04/14/2022: Conclusions: Severe three-vessel coronary artery disease, as detailed in Dr. Elissa Hefty diagnostic catheterization report from yesterday. Successful PCI to proximal RCA using Onyx Frontier 2.5 x 18 mm drug-eluting stent with 0% residual stenosis and TIMI-3 flow. Successful PCI to  proximal LAD stenosis using Onyx Frontier 3.5 x 15 mm drug-eluting stent with 0% residual stenosis and TIMI-3 flow. Successful angioplasty and drug-eluting stent placement to in-stent restenosis involving long segment of mid/distal LAD stenting.  Raymond entire stent was angioplastied and an Onyx Frontier 2.0 x 18 mm drug-eluting stent placed at Raymond distal edge, leading to reduction of stenosis from 70% to 20-30% in Raymond proximal and mid portions and 0% residual stenosis at Raymond distal edge with TIMI-3 flow throughout Raymond vessel.   Recommendations: Continue indefinite dual antiplatelet therapy with aspirin and clopidogrel. Aggressive secondary prevention of coronary artery disease. Staged PCI to LCx disease.  Timing will depend on Raymond Sampson's symptoms.  If Raymond Sampson angina free and otherwise stable for discharge over Raymond weekend, staged PCI could be arranged to be done in Raymond next 1 to 2 weeks as an outpatient.  If Raymond Sampson has recurrent angina or Sampson otherwise still in [facility] hospital, PCI to Raymond LCx could be performed early next week. __________   [facility] 04/13/2022:   --------Severe 3 Vessel CAD--------------   Small caliber-codominant RCA: Prox RCA-1 lesion Sampson 70% stenosis tapers to Prox RCA-2 lesion Sampson 99% stenosed. (Likely Culprit Lesion #1)   Codominant circumflex:  Prox Cx lesion Sampson 55% stenosed.  1st Mrg lesion Sampson 70% stenosed. Just past 1st Mrf, Mid Cx to Dist Cx lesion Sampson 80% stenosed with 70% stenosed side branch in 2nd Mrg.   Prox LAD to Mid LAD lesion Sampson 80% stenosed - focal/concentric.  Mid LAD to Dist LAD long stented segment Sampson 70% stenosed. In-stent restenosis. Dist LAD lesion Sampson 95% stenosed -> involving Raymond distal stent edge into native vessel.   -------------------------------------------   LV end diastolic pressure Sampson moderately elevated.   There Sampson no aortic valve stenosis.     FOR FUTURE CARDIAC CATHETERIZATION, WOULD NOT RECOMMEND USING RIGHT RADIAL ACCESS DUE TO Raymond EXTREME TORTUOSITY OF Raymond  SUBCLAVIAN-INNOMINATE ARTERY AND AORTIC ARCH.   PLAN OF CARE:  Continue with plan to admit overnight.  Will restart IV heparin 2 hours after sheath removal.             Will need to meet with Raymond Sampson and family as well as have a heart team discussion with interventional cardiology and CVTS to determine best treatment  options.  If Raymond Sampson were to need PCI, may be best for him to be transferred to [facility] as Raymond procedure would like to be prolonged and difficult.Potential needs to be noted to Raymond Sampson's mental status, and baseline functional status. __________   2D echo 04/13/2022: 1. Left ventricular ejection fraction, by estimation, Sampson 55 to 60%. Raymond  left ventricle has normal function. Raymond left ventricle demonstrates  regional wall motion abnormalities (hypokinesis of teh basal septal  region). There Sampson mild left ventricular  hypertrophy. Left ventricular diastolic parameters are consistent with  Grade I diastolic dysfunction (impaired relaxation).   2. Right ventricular systolic function Sampson normal. Raymond right ventricular  size Sampson normal. There Sampson normal pulmonary artery systolic pressure. Raymond  estimated right ventricular systolic pressure Sampson 19.3 mmHg.   3. Raymond mitral valve Sampson normal in structure. No evidence of mitral valve  regurgitation. No evidence of mitral stenosis.   4. Raymond aortic valve Sampson normal in structure. Aortic valve regurgitation Sampson  mild. Aortic valve sclerosis Sampson present, with no evidence of aortic valve  stenosis.   5. Raymond inferior vena cava Sampson normal in size with greater than 50%  respiratory variability, suggesting right atrial pressure of 3 mmHg.  __________   Carotid artery ultrasound 12/06/2018: Summary:  Right Carotid: Velocities in Raymond right ICA are consistent with a 1-39%  stenosis.  Non-hemodynamically significant plaque <50% noted in Raymond CCA. Raymond ECA appears <50% stenosed.   Left Carotid: Velocities in Raymond left ICA are consistent with a 1-39%  stenosis.   Non-hemodynamically significant plaque <50% noted in Raymond CCA. Raymond ECA appears >50% stenosed.   Vertebrals:  Bilateral vertebral arteries demonstrate antegrade flow.  Subclavians: Normal flow hemodynamics were seen in bilateral subclavian arteries.  __________   Luci Bank patch 05/2016: Normal sinus rhythm Min HR of 40 bpm, max HR of 171 bpm, and avg HR of 69 bpm.    7 Atrial tachycardia runs occurred, Raymond longest lasting 7 beats with an avg rate of 105 bpm.   Isolated SVEs were rare (<1.0%), SVE Couplets were rare (<1.0%), and SVE Triplets were rare (<1.0%).  Isolated VEs were rare (<1.0%), VE Couplets were rare (<1.0%), and no VE Triplets were present. __________   [facility] 05/02/2016: Pharmacological myocardial perfusion imaging study with no significant  Ischemia Small region of predominantly fixed defect in Raymond anteroapical wall of mild severity Normal wall motion (GI uptake artifact noted),  EF estimated at 59% No EKG changes concerning for ischemia at peak stress or in recovery. Severe hypertension noted, SBP 180 Low risk scan   EKG:  EKG Sampson ordered today.  Raymond EKG ordered today demonstrates NSR, 84 bpm, left axis deviation, prior anterior infarct, baseline artifact and wandering with nonspecific ST-T changes  Recent Labs: 09/01/2022: TSH 0.781 09/03/2022: ALT 23 09/12/2022: Magnesium 2.1 09/22/2022: BUN 48; Creatinine, Ser 1.05; Hemoglobin 9.8; Platelets 261; Potassium 3.7; Sodium 139  Recent Lipid Panel    Component Value Date/Time   CHOL 205 (H) 04/13/2022 0430   CHOL 132 05/29/2019 1113   CHOL 138 12/28/2012 0506   TRIG 257 (H) 04/13/2022 0430   TRIG 197 12/28/2012 0506   TRIG 111 08/06/2008 0000   HDL 40 (L) 04/13/2022 0430   HDL 34 (L) 05/29/2019 1113   HDL 34 (L) 12/28/2012 0506   CHOLHDL 5.1 04/13/2022 0430   VLDL 51 (H) 04/13/2022 0430   VLDL 39 12/28/2012 0506   LDLCALC 114 (H) 04/13/2022 0430   LDLCALC 64  05/29/2019 1113   LDLCALC 65 12/28/2012 0506    LDLDIRECT 65 08/21/2022 1432    PHYSICAL EXAM:    VS:  BP (!) 160/78 (BP Location: Left Arm, Raymond Sampson Position: Sitting, Cuff Size: Normal)   Pulse 84   Ht 5\' 7"  (1.702 m)   Wt 137 lb (62.1 kg)   SpO2 95%   BMI 21.46 kg/m   BMI: Body mass index Sampson 21.46 kg/m.  Physical Exam Vitals reviewed.  Constitutional:      Appearance: Raymond Sampson well-developed.  HENT:     Head: Normocephalic and atraumatic.  Eyes:     General:        Right eye: No discharge.        Left eye: No discharge.  Neck:     Vascular: No JVD.  Cardiovascular:     Rate and Rhythm: Normal rate and regular rhythm.     Heart sounds: Normal heart sounds, S1 normal and S2 normal. Heart sounds not distant. No midsystolic click and no opening snap. No murmur heard.    No friction rub.  Pulmonary:     Effort: Pulmonary effort Sampson normal. No respiratory distress.     Breath sounds: Normal breath sounds. No decreased breath sounds, wheezing or rales.  Chest:     Chest wall: No tenderness.  Abdominal:     General: There Sampson no distension.  Musculoskeletal:     Cervical back: Normal range of motion.     Right lower leg: No edema.     Left lower leg: No edema.  Skin:    General: Skin Sampson warm and dry.     Nails: There Sampson no clubbing.  Neurological:     Mental Status: Raymond Sampson alert and oriented to person, place, and time.  Psychiatric:        Speech: Speech normal.        Behavior: Behavior normal.        Thought Content: Thought content normal.        Judgment: Judgment normal.     Wt Readings from Last 3 Encounters:  12/05/22 137 lb (62.1 kg)  09/19/22 143 lb (64.9 kg)  07/25/22 141 lb 8 oz (64.2 kg)     ASSESSMENT & PLAN:   Multivessel CAD involving Raymond native coronary arteries without angina: Raymond Sampson doing well and without symptoms concerning for angina or cardiac decompensation.  Stop clopidogrel as this was previously discontinued at hospital discharge in Raymond setting of SDH and SAH with associated midline shift.   Continue aspirin 81 mg daily after discussion with interventional cardiology.  Restart beta-blocker as outlined below with recommendation to reinitiate statin therapy in follow-up.  No indication for ischemic testing at this time.  HTN: Blood pressure elevated in Raymond office today.  Resume Lopressor 25 mg twice daily until runs out of current prescription with recommendation to consolidate to Toprol-XL 25 mg daily at that point with likely recommendation to titrate further as indicated.  For now, remains off lisinopril.  HLD: LDL 53 in 08/2022.  For now, remains off atorvastatin.  Will look to restart at follow-up.  Raymond Sampson reports Raymond Sampson previously had pruritus associated with one of Raymond Sampson medications, though Raymond Sampson not sure which.  In this setting, we will resume Raymond medications one at a time.  Bilateral carotid artery disease: Stable 1 to 39% bilateral ICA stenoses.  Aspirin as outlined above.  PAD: No symptoms claudication or critical limb ischemia.  Has been evaluated by Dr. Kirke Corin with  recommendation for medical therapy in this setting.  Should Raymond Sampson develop symptoms consistent with critical limb ischemia would need to consider angiography.  However, Raymond Sampson may not be a good candidate for this given history of recurrent falls with SDH and SAH.  Atrial tachycardia: Quiescent.  Restart metoprolol as above.  History of TIA/CVA: No new deficits.  Follow-up with PCP and neurology as directed.  History of SDH and SAH in Raymond setting of recurrent mechanical fall complicated by seizures and possible acute ischemic infarct versus shear injury: Follow-up with neurology.  Anemia with history of ABL requiring PRBC: Hgb low, though stable on last check in 09/2022.  No symptoms concerning for ongoing bleeding.  Follow-up with PCP.  Pulmonary nodule: Has previously been advised to follow-up with PCP.  This was not discussed in detail at today's visit.     Disposition: F/u with Dr. Mariah Milling or an APP in 4-6  weeks.   Medication Adjustments/Labs and Tests Ordered: Current medicines are reviewed at length with Raymond Sampson today.  Concerns regarding medicines are outlined above. Medication changes, Labs and Tests ordered today are summarized above and listed in Raymond Sampson Instructions accessible in Encounters.   Signed, Eula Listen, PA-C 12/05/2022 4:36 PM     [facility] [place] [address] [place], [place] [address] [phone]

## 2022-12-06 NOTE — Telephone Encounter (Signed)
Patient seen in office on 12/05/22 with Eula Listen, PA-C.

## 2022-12-08 ENCOUNTER — Other Ambulatory Visit: Payer: Self-pay

## 2022-12-08 ENCOUNTER — Encounter: Payer: Self-pay | Admitting: Emergency Medicine

## 2022-12-08 ENCOUNTER — Emergency Department

## 2022-12-08 DIAGNOSIS — Y92019 Unspecified place in single-family (private) house as the place of occurrence of the external cause: Secondary | ICD-10-CM | POA: Diagnosis not present

## 2022-12-08 DIAGNOSIS — W19XXXA Unspecified fall, initial encounter: Secondary | ICD-10-CM | POA: Diagnosis not present

## 2022-12-08 DIAGNOSIS — S20229A Contusion of unspecified back wall of thorax, initial encounter: Secondary | ICD-10-CM | POA: Insufficient documentation

## 2022-12-08 DIAGNOSIS — S51812A Laceration without foreign body of left forearm, initial encounter: Secondary | ICD-10-CM | POA: Diagnosis not present

## 2022-12-08 DIAGNOSIS — S59912A Unspecified injury of left forearm, initial encounter: Secondary | ICD-10-CM | POA: Diagnosis present

## 2022-12-08 DIAGNOSIS — F0390 Unspecified dementia without behavioral disturbance: Secondary | ICD-10-CM | POA: Insufficient documentation

## 2022-12-08 NOTE — ED Triage Notes (Signed)
Pt presents ambulatory to triage via POV with complaints of Back pain following a fall tonight. Pt slipped losing his balance and landing on his back. He has an abrasion and hematoma to the L upper back and a skin tear to the L elbow that was dressed PTA. A&Ox4 at this time. Denies LOC, hitting his head, not on thinners.

## 2022-12-09 ENCOUNTER — Emergency Department
Admission: EM | Admit: 2022-12-09 | Discharge: 2022-12-09 | Disposition: A | Discharge status: Home / Self Care | Attending: Emergency Medicine | Admitting: Emergency Medicine

## 2022-12-09 DIAGNOSIS — S20222A Contusion of left back wall of thorax, initial encounter: Secondary | ICD-10-CM

## 2022-12-09 DIAGNOSIS — S51819A Laceration without foreign body of unspecified forearm, initial encounter: Secondary | ICD-10-CM

## 2022-12-09 DIAGNOSIS — W19XXXA Unspecified fall, initial encounter: Secondary | ICD-10-CM

## 2022-12-09 NOTE — Discharge Instructions (Signed)
Please keep the wound clean and dry and use nonstick dressings or antibiotic ointment such as bacitracin to keep the gauze from sticking to the wound on your arm.  Please use over-the-counter ibuprofen and/or Tylenol as needed for pain unless your doctor histology to avoid either of these medications.  Continue on your regular medications.  Follow-up with your primary care doctor at the next available opportunity.  Return to the emergency department if you develop new or worsening symptoms that concern you.

## 2022-12-09 NOTE — ED Provider Notes (Signed)
[facility] Provider Note    Event Date/Time   First MD Initiated Contact with Patient 12/09/22 [phone]     (approximate)   History   Fall   HPI Raymond Sampson is a 82 y.o. male with history that includes some mild dementia and extensive other associated chronic medical issues cared for primarily by his wife.  He presents for evaluation after a fall at home.  Usually she helps him ambulate and he uses a walker.  However he got a little bit ahead of himself and he she did not have a hand on him.  He lost balance and fell, landing on his left upper back and hitting his left forearm.  He did not strike his head and did not lose consciousness.  He is not on blood thinners.  He is at his baseline mental status.  His wife reports that he actually told her that he did not want to go to the hospital, but he had a large contusion on his back as well as injury to his skin on his left forearm so his family wanted to make sure everything was okay.  He has no headache and no neck pain.  He has minimal pain to his back despite a contusion.     Physical Exam   Triage Vital Signs: ED Triage Vitals  Encounter Vitals Group     BP 12/08/22 2328 (!) 162/77     Systolic BP Percentile --      Diastolic BP Percentile --      Pulse Rate 12/08/22 2328 76     Resp 12/08/22 2328 17     Temp 12/08/22 2328 97.9 F (36.6 C)     Temp src --      SpO2 12/08/22 2328 99 %     Weight 12/08/22 2328 64.4 kg (142 lb)     Height 12/08/22 2328 1.702 m (5\' 7" )     Head Circumference --      Peak Flow --      Pain Score 12/08/22 2328 7     Pain Loc --      Pain Education --      Exclude from Growth Chart --     Most recent vital signs: Vitals:   12/09/22 0130 12/09/22 0200  BP: (!) 141/63 (!) 135/106  Pulse: 70 78  Resp:    Temp:    SpO2: 98% 100%    General: Awake, no obvious distress.  Obviously debilitated at baseline but awake and alert and communicative and even making  jokes. CV:  Good peripheral perfusion.  Regular rate and rhythm. Resp:  Normal effort. Speaking easily and comfortably, no accessory muscle usage nor intercostal retractions.   Abd:  No distention.  Other:  Patient has a large contusion with abrasion to the left posterior thoracic region of his back.  However the family had marked the area that was swollen before and it has already gone down substantially.  He has no tenderness to palpation along the length of his spine from his cervical spine through his thoracic spine and down to the bottom of his lumbar spine.  He has an area on his left forearm that appears almost macerated due to multiple skin tears with missing tissue that is not amenable to closure.  Otherwise no acute injuries are evident to his extremities.   ED Results / Procedures / Treatments   Labs (all labs ordered are listed, but only abnormal results are displayed) Labs  Reviewed - No data to display   RADIOLOGY I viewed and interpreted the patient's thoracic spine x-rays and I see no obvious sign of acute fracture.  The radiologist mentioned that if an occult fracture is of concern then a CT could be obtained.   PROCEDURES:  Critical Care performed: No  Procedures    IMPRESSION / MDM / ASSESSMENT AND PLAN / ED COURSE  I reviewed the triage vital signs and the nursing notes.                              Differential diagnosis includes, but is not limited to, fall, contusion, dislocation, fracture, internal bleeding.  Patient's presentation is most consistent with acute complicated illness / injury requiring diagnostic workup.  Labs/studies ordered: Thoracic spine x-rays  Interventions/Medications given:  Medications - No data to display  (Note:  hospital course my include additional interventions and/or labs/studies not listed above.)   Despite the patient's age and comorbidities, his physical exam is reassuring except for superficial injuries.  The  contusion on his back his already improved and he is no longer on blood thinners which is reassuring.  His skin tears on the left arm are superficial and will need to be treated simply with wound care and heal by secondary intention.  He and his family are reassured by the results.  There is no indication for any additional evaluation at this time and the family and the patient agree with this plan.  He will be discharged for close outpatient follow-up.  I gave my usual and customary return precautions.        FINAL CLINICAL IMPRESSION(S) / ED DIAGNOSES   Final diagnoses:  Fall, initial encounter  Skin tear of forearm without complication, initial encounter  Contusion of left side of back, initial encounter     Rx / DC Orders   ED Discharge Orders     None        Note:  This document was prepared using Dragon voice recognition software and may include unintentional dictation errors.   Loleta Rose, MD 12/09/22 [phone]

## 2022-12-11 NOTE — Group Note (Deleted)

## 2022-12-13 ENCOUNTER — Telehealth: Payer: Self-pay | Admitting: Cardiovascular Disease

## 2022-12-13 NOTE — Telephone Encounter (Signed)
Pt c/o medication issue:  1. Name of Medication:   metoprolol succinate (TOPROL XL) 25 MG 24 hr tablet    2. How are you currently taking this medication (dosage and times per day)?   3. Are you having a reaction (difficulty breathing--STAT)?   4. What is your medication issue? Patient's wife states that she believes this medication is causing patient to bruise and swell more in an area where patient has been injured after a fall. Requesting call back to discuss further.

## 2022-12-13 NOTE — Telephone Encounter (Signed)
Unlikely to be caused by metoprolol. More likely due to residual trauma from fall. Recommend following up with PCP

## 2022-12-13 NOTE — Telephone Encounter (Signed)
Left detailed message explaining the following:  "Unlikely to be caused by metoprolol. More likely due to residual trauma from fall. Recommend following up with PCP"

## 2022-12-14 ENCOUNTER — Other Ambulatory Visit: Payer: Self-pay | Admitting: Cardiovascular Disease

## 2023-01-05 NOTE — Progress Notes (Unsigned)
Cardiology Office Note    Date:  01/08/2023   ID:  Raymond Sampson, DOB [birth year], MRN [id number]  PCP:  Gracelyn Nurse, MD  Cardiologist:  Julien Nordmann, MD  Electrophysiologist:  None   Chief Complaint: Follow up  History of Present Illness:   Raymond Sampson is a 82 y.o. male with history of CAD with apical MI in 2007 status post stenting x 3 to the LAD with NSTEMI in 04/2022 status post PCI and DES to the LAD and RCA with staged PCI to the LCx, TIA/CVA in 2012, atrial tachycardia, bilateral carotid artery disease, dementia, PAD, DM2, HTN, HLD, and prior tobacco use who presents for follow-up of CAD.   He was admitted to the hospital in 08/2005 with an MI that was treated with PCI/DES x 3 to the LAD. Stress test in 2013 showed no significant ischemia, EF 68%, with severely elevated BP. Echo in 2014 showed an EF of 60-65%, diastolic dysfunction, normal RVSF and cavity size, normal RVSP. He was seen in early 2018 with palpitations and abnormal EKG leading to a Myoview in 04/2016 that was low risk. Outpatient cardiac monitoring in 05/2016 showed NSR with an average heart rate of 69 bpm (40-171 bpm), 7 atrial tachycardic runs occurred with the longest lasting 7 beats with an average rate of 105 bpm with no evidence of Afib.  Most recent carotid artery ultrasound from 12/2018 showed 1 to 39% bilateral ICA stenosis with bilateral vertebral arteries demonstrating antegrade flow and normal flow hemodynamics in the bilateral subclavian arteries.   He was admitted to [facility] in 04/2022 with an NSTEMI.  High-sensitivity troponin peaked at 1422.  CTA of the chest showed no evidence of PE with an incidental nodular density noted in the left lung base as well as mild right hilar lymphadenopathy, right adrenal adenoma, and aortic atherosclerosis.  Echo showed an EF of 55 to 60%, mild LVH, hypokinesis of the basal septal region, grade 1 diastolic dysfunction, normal RV systolic function and ventricular cavity  size, normal PASP, aortic valve sclerosis without evidence of stenosis, and normal estimated right atrial pressure.  LHC on 04/13/2022 showed severe three-vessel CAD as outlined below.  After further discussion, he was not felt to be a CABG candidate given advanced age and dementia with recommendation to proceed with complex PCI intervention.  He subsequently underwent repeat LHC on 04/14/2022 that again showed severe three-vessel CAD.  He underwent successful PCI/DES to the proximal LAD, PCI/DES to the proximal RCA, and PCI/DES to in-stent restenosis involving the long segment of mid/distal LAD stenting.  The entire stent was angioplastied and Onyx Frontier 2.0 x 18 mm drug-eluting stent was placed at the distal edge leading to reduction of stenosis from 70% to 20 to 30% in the proximal and mid portions with 0% residual stenosis at the distal edge and TIMI-3 flow throughout the vessel.  Following these interventions, he remained chest pain-free with family preferring the patient be discharged with planned staged PCI to the LCx be pursued in the outpatient setting.   He was seen in hospital follow-up on 04/26/2022 and was without symptoms of angina or cardiac decompensation.  He was adherent to cardiac medications.  No falls or hematochezia/melena.  His wife did report a 2-day history of dark urine following his hospital discharge that subsequently resolved without intervention and had not recurred.  Patient and wife remain interested in moving forward with staged PCI of the LCx.  He underwent staged PCI of the LCx on  05/02/2022 with procedure notable for a small hematoma that remained stable and did not require treatment beyond manual compression.  Post-procedural hemoglobin was mildly low, though stable on repeat at 10.9.  He subsequently enrolled in cardiac rehab.   Carotid artery ultrasound from 06/2022 showed 1 to 39% bilateral ICA stenosis with antegrade flow of the bilateral vertebral arteries and normal flow  hemodynamics of the bilateral subclavian arteries.  ABI in 06/2022 showed 0.95 on the right and 0.65 on the left.  Duplex on the right showed borderline significant popliteal artery stenosis.  On the left, there was moderate stenosis affecting the common femoral artery with severe stenosis in the distal SFA.   He was admitted to the hospital in 06/2022 after sustaining a mechanical fall, slipping on soap in the bathtub.  Workup showed right sided rib fractures 8-12, intramural hematoma within the paraspinal neck musculature, small soft right sided hemothorax, and acute blood loss anemia status post 1 unit PRBC.  CT imaging was also notable for thoracic aortic arch measuring 3.8 cm in diameter.  Plavix was initially held, then resumed.  During admission he did have chest pain with troponin peaking at 4936.  Conservative management was recommended.   He was evaluated by Dr. Kirke Corin for PAD in 07/2022 with recommendation for continued medical therapy given he was without claudication or critical limb ischemia.   He was admitted in 08/2022 after sustaining a mechanical fall, striking his head.  Imaging notable for SDH and SAH with midline shift.  Neurosurgery felt the patient was not a candidate for evacuation.  Admission also notable for recurrent pneumonia, acute blood loss anemia requiring 2 units PRBC, and partial focal seizures.  Discharge summary indicates aspirin and Plavix were discontinued by interventional cardiology after further consideration.  He was discharged to hospice facility, though has subsequently graduated from hospice.  He was last seen in the office on 12/05/2022 with the patient and wife indicating the patient had done well from a cardiac perspective, and was without symptoms of angina or cardiac decompensation.  He comes in accompanied by his wife today who assists with the history.  He is without symptoms of angina or cardiac decompensation.  No dyspnea, palpitations, dizziness, presyncope,  or syncope.  He did have 1 mechanical fall since he was last seen leading to a hematoma along the left posterior chest that is resolving.  Remains on aspirin and Lopressor 25 mg twice daily.  Continues to be followed by hospice.  Indicates he is uncertain if he would want to forward with further follow-up imaging as recommended by prior radiological studies.    Labs independently reviewed: 12/2022 - potassium 4.8, BUN 18, serum creatinine 1.1, A1c 6.1 09/2022 - Hgb 9.8, PLT 261, magnesium 2.1, albumin 2.2, AST/ALT normal 08/2022 - TSH normal, TC 116, TG 127, HDL 37, LDL 53  Past Medical History:  Diagnosis Date   Arthritic-like pain    CAD (coronary artery disease)    a. apical MI 5/07 with LHC showing 50% pCFX, 30% pRCA, 99% pLAD, 99% mLAD, 75% dLAD (cypher DES x3 to LAD); b. ETT-myoview (12/08) 81% MPHR,  EF 64%, partially reversible inferoapical perfusion defect similar to prior study 12/07; c. 04/2016 MV: EF 59%, fixed anteroapical defect, no ischemia->Low risk.   Carotid arterial disease (HCC)    a. 05/2016 Carotid U/S: 30-40% bilat dzs, f/u in 2 yrs.   Chest pain 04/13/2022   CVA (cerebral vascular accident) [facility])    a. 07/2010 - h/o CVA/TIA.  Diet-controlled type 2 diabetes mellitus (HCC)    HLD (hyperlipidemia)    HTN (hypertension)    PAT (paroxysmal atrial tachycardia) (HCC)    a. 06/2016 Event monitor:  rare episodes of atrial tachycardia, longest 7 beats. No afib.    Past Surgical History:  Procedure Laterality Date   BACK SURGERY     CARDIAC CATHETERIZATION     CORONARY ANGIOPLASTY WITH STENT PLACEMENT     apical MI 5/07 with LHC showing 50% pCFX, 30% pRCA, 99% pLAD, 9%% mLAD, 75% dLAD. cypher DES x3 to LAD. ETT-myoview (12/08) 81% MPHR, 8'1, EF 64%, partially reversible inferoapical perfusion defect similar to prior study 12/07   CORONARY IMAGING/OCT N/A 04/14/2022   Procedure: INTRAVASCULAR IMAGING/OCT;  Surgeon: Yvonne Kendall, MD;  Location: [facility] INVASIVE CV LAB;   Service: Cardiovascular;  Laterality: N/A;   CORONARY STENT INTERVENTION N/A 04/14/2022   Procedure: CORONARY STENT INTERVENTION;  Surgeon: Yvonne Kendall, MD;  Location: [facility] INVASIVE CV LAB;  Service: Cardiovascular;  Laterality: N/A;   CORONARY STENT INTERVENTION Left 05/02/2022   Procedure: CORONARY STENT INTERVENTION;  Surgeon: Yvonne Kendall, MD;  Location: [facility] INVASIVE CV LAB;  Service: Cardiovascular;  Laterality: Left;   LEFT HEART CATH AND CORONARY ANGIOGRAPHY N/A 04/13/2022   Procedure: LEFT HEART CATH AND CORONARY ANGIOGRAPHY;  Surgeon: Marykay Lex, MD;  Location: [facility] INVASIVE CV LAB;  Service: Cardiovascular;  Laterality: N/A;    Current Medications: Current Meds  Medication Sig   aspirin EC 81 MG tablet Take 81 mg by mouth daily. Swallow whole.   cyanocobalamin 1000 MCG tablet Take 1,000 mcg by mouth daily.   LORazepam (ATIVAN) 2 MG/ML injection Inject 0.5 mLs (1 mg total) into the vein every 4 (four) hours as needed for seizure.   metFORMIN (GLUCOPHAGE) 1000 MG tablet Take 1,000 mg by mouth 2 (two) times daily.   metoprolol tartrate (LOPRESSOR) 25 MG tablet Take 1 tablet (25 mg total) by mouth 2 (two) times daily.   Multiple Vitamins-Minerals (CENTRUM SILVER 50+MEN PO) Take by mouth.   Nystatin (GERHARDT'S BUTT CREAM) CREA Apply 1 Application topically 4 (four) times daily. (Patient taking differently: Apply 1 Application topically as needed for irritation.)   sertraline (ZOLOFT) 25 MG tablet Take 25 mg by mouth every evening.   [DISCONTINUED] metoprolol succinate (TOPROL XL) 25 MG 24 hr tablet Take 1 tablet (25 mg total) by mouth daily.    Allergies:   Metformin and related and Simvastatin   Social History   Socioeconomic History   Marital status: Married    Spouse name: Not on file   Number of children: Not on file   Years of education: Not on file   Highest education level: Not on file  Occupational History   Occupation: retired  Tobacco Use   Smoking  status: Former    Current packs/day: 0.00    Average packs/day: 1 pack/day for 25.0 years (25.0 ttl pk-yrs)    Types: Cigarettes    Start date: 04/17/1960    Quit date: 04/17/1985    Years since quitting: 37.7   Smokeless tobacco: Never  Vaping Use   Vaping status: Never Used  Substance and Sexual Activity   Alcohol use: No   Drug use: No   Sexual activity: Not on file  Other Topics Concern   Not on file  Social History Narrative   Part time work at [employer]. Does not regularly exercise.    Social Determinants of Health   Financial Resource Strain: Not on file  Food Insecurity: No Food Insecurity (08/29/2022)   Hunger Vital Sign    Worried About Running Out of Food in the Last Year: Never true    Ran Out of Food in the Last Year: Never true  Transportation Needs: No Transportation Needs (08/29/2022)   PRAPARE - Administrator, Civil Service (Medical): No    Lack of Transportation (Non-Medical): No  Physical Activity: Not on file  Stress: Not on file  Social Connections: Not on file     Family History:  The patient's Family history is unknown by patient.  ROS:   12-point review of systems is negative unless otherwise noted in the HPI.   EKGs/Labs/Other Studies Reviewed:    Studies reviewed were summarized above. The additional studies were reviewed today:  2D echo 08/22/2022: 1. There is no left ventricular thrombus seen (Definity contrast was  used), but there is slow stagnant flow in the apical aneurysm. Left  ventricular ejection fraction, by estimation, is 60 to 65%. The left  ventricle has normal function. The left  ventricle demonstrates regional wall motion abnormalities (see scoring  diagram/findings for description). Left ventricular diastolic parameters  are consistent with Grade II diastolic dysfunction (pseudonormalization).  Elevated left atrial pressure.   2. Right ventricular systolic function is normal. The right ventricular  size is  normal. There is normal pulmonary artery systolic pressure.   3. There is a small right to left shunt during the saline contrast  infusion that occurs early (after 3 beats), consistent with a small PFO.  Agitated saline contrast bubble study was positive with shunting observed  within 3-6 cardiac cycles suggestive of   interatrial shunt. There is a small patent foramen ovale.   4. The mitral valve is normal in structure. No evidence of mitral valve  regurgitation. No evidence of mitral stenosis.   5. The aortic valve is normal in structure. Aortic valve regurgitation is  not visualized. No aortic stenosis is present.   6. The inferior vena cava is normal in size with greater than 50%  respiratory variability, suggesting right atrial pressure of 3 mmHg.   Comparison(s): Prior images reviewed side by side. Changes from prior  study are noted. 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  __________  LHC 05/02/2022: Conclusions: Patent LAD stents with stable mild in-stent restenosis of remotely placed mid-distal LAD stents, unchanged from completion of last catheterization.  Recently placed proximal LAD stent is widely patent. Severe LCx/OM2 disease, similar to prior catheterizations. Mildly elevated left ventricular filling pressure. Successful complex bifurcation PCI to LCx/OM2 using Culotte technique (Onyx frontier 2.75 x 30 mm main branch and 2.5 x 15 mm side branch drug-eluting stents) with 0% residual stenosis and TIMI-3 flow.   Recommendations: Continue dual antiplatelet therapy with aspirin and clopidogrel for at least 12 months. Aggressive secondary prevention. Overnight observation. ___________   [facility] 04/14/2022: Conclusions: Severe three-vessel coronary artery disease, as detailed in Dr. Elissa Hefty diagnostic catheterization report from yesterday. Successful PCI to proximal RCA using Onyx Frontier 2.5 x 18 mm drug-eluting  stent with 0% residual stenosis and TIMI-3 flow. Successful PCI to proximal LAD stenosis using Onyx Frontier 3.5 x 15 mm drug-eluting stent with 0% residual stenosis and TIMI-3 flow. Successful angioplasty and drug-eluting stent placement to in-stent restenosis involving long segment of mid/distal LAD stenting.  The entire stent was angioplastied and an Onyx Frontier 2.0 x 18 mm drug-eluting stent placed at the distal edge, leading  to reduction of stenosis from 70% to 20-30% in the proximal and mid portions and 0% residual stenosis at the distal edge with TIMI-3 flow throughout the vessel.   Recommendations: Continue indefinite dual antiplatelet therapy with aspirin and clopidogrel. Aggressive secondary prevention of coronary artery disease. Staged PCI to LCx disease.  Timing will depend on patient's symptoms.  If he is angina free and otherwise stable for discharge over the weekend, staged PCI could be arranged to be done in the next 1 to 2 weeks as an outpatient.  If he has recurrent angina or is otherwise still in the hospital, PCI to the LCx could be performed early next week. __________   [facility] 04/13/2022:   --------Severe 3 Vessel CAD--------------   Small caliber-codominant RCA: Prox RCA-1 lesion is 70% stenosis tapers to Prox RCA-2 lesion is 99% stenosed. (Likely Culprit Lesion #1)   Codominant circumflex:  Prox Cx lesion is 55% stenosed.  1st Mrg lesion is 70% stenosed. Just past 1st Mrf, Mid Cx to Dist Cx lesion is 80% stenosed with 70% stenosed side branch in 2nd Mrg.   Prox LAD to Mid LAD lesion is 80% stenosed - focal/concentric.  Mid LAD to Dist LAD long stented segment is 70% stenosed. In-stent restenosis. Dist LAD lesion is 95% stenosed -> involving the distal stent edge into native vessel.   -------------------------------------------   LV end diastolic pressure is moderately elevated.   There is no aortic valve stenosis.     FOR FUTURE CARDIAC CATHETERIZATION, WOULD NOT RECOMMEND  USING RIGHT RADIAL ACCESS DUE TO THE EXTREME TORTUOSITY OF THE SUBCLAVIAN-INNOMINATE ARTERY AND AORTIC ARCH.   PLAN OF CARE:  Continue with plan to admit overnight.  Will restart IV heparin 2 hours after sheath removal.             Will need to meet with patient and family as well as have a heart team discussion with interventional cardiology and CVTS to determine best treatment options.  If he were to need PCI, may be best for him to be transferred to [facility] as the procedure would like to be prolonged and difficult.Potential needs to be noted to the patient's mental status, and baseline functional status. __________   2D echo 04/13/2022: 1. Left ventricular ejection fraction, by estimation, is 55 to 60%. The  left ventricle has normal function. The left ventricle demonstrates  regional wall motion abnormalities (hypokinesis of teh basal septal  region). There is mild left ventricular  hypertrophy. Left ventricular diastolic parameters are consistent with  Grade I diastolic dysfunction (impaired relaxation).   2. Right ventricular systolic function is normal. The right ventricular  size is normal. There is normal pulmonary artery systolic pressure. The  estimated right ventricular systolic pressure is 19.3 mmHg.   3. The mitral valve is normal in structure. No evidence of mitral valve  regurgitation. No evidence of mitral stenosis.   4. The aortic valve is normal in structure. Aortic valve regurgitation is  mild. Aortic valve sclerosis is present, with no evidence of aortic valve  stenosis.   5. The inferior vena cava is normal in size with greater than 50%  respiratory variability, suggesting right atrial pressure of 3 mmHg.  __________   Carotid artery ultrasound 12/06/2018: Summary:  Right Carotid: Velocities in the right ICA are consistent with a 1-39%  stenosis.  Non-hemodynamically significant plaque <50% noted in the CCA. The ECA appears <50% stenosed.   Left Carotid:  Velocities in the left ICA are consistent with a  1-39%  stenosis.  Non-hemodynamically significant plaque <50% noted in the CCA. The ECA appears >50% stenosed.   Vertebrals:  Bilateral vertebral arteries demonstrate antegrade flow.  Subclavians: Normal flow hemodynamics were seen in bilateral subclavian arteries.  __________   Luci Bank patch 05/2016: Normal sinus rhythm Min HR of 40 bpm, max HR of 171 bpm, and avg HR of 69 bpm.    7 Atrial tachycardia runs occurred, the longest lasting 7 beats with an avg rate of 105 bpm.   Isolated SVEs were rare (<1.0%), SVE Couplets were rare (<1.0%), and SVE Triplets were rare (<1.0%).  Isolated VEs were rare (<1.0%), VE Couplets were rare (<1.0%), and no VE Triplets were present. __________   [facility] 05/02/2016: Pharmacological myocardial perfusion imaging study with no significant  Ischemia Small region of predominantly fixed defect in the anteroapical wall of mild severity Normal wall motion (GI uptake artifact noted),  EF estimated at 59% No EKG changes concerning for ischemia at peak stress or in recovery. Severe hypertension noted, SBP 180 Low risk scan   EKG:  EKG is not ordered today.    Recent Labs: 09/01/2022: TSH 0.781 09/03/2022: ALT 23 09/12/2022: Magnesium 2.1 09/22/2022: BUN 48; Creatinine, Ser 1.05; Hemoglobin 9.8; Platelets 261; Potassium 3.7; Sodium 139  Recent Lipid Panel    Component Value Date/Time   CHOL 205 (H) 04/13/2022 0430   CHOL 132 05/29/2019 1113   CHOL 138 12/28/2012 0506   TRIG 257 (H) 04/13/2022 0430   TRIG 197 12/28/2012 0506   TRIG 111 08/06/2008 0000   HDL 40 (L) 04/13/2022 0430   HDL 34 (L) 05/29/2019 1113   HDL 34 (L) 12/28/2012 0506   CHOLHDL 5.1 04/13/2022 0430   VLDL 51 (H) 04/13/2022 0430   VLDL 39 12/28/2012 0506   LDLCALC 114 (H) 04/13/2022 0430   LDLCALC 64 05/29/2019 1113   LDLCALC 65 12/28/2012 0506   LDLDIRECT 65 08/21/2022 1432    PHYSICAL EXAM:    VS:  BP (!) 152/70   Pulse 66    Ht 5\' 7"  (1.702 m)   Wt 140 lb 6.4 oz (63.7 kg)   SpO2 97%   BMI 21.99 kg/m   BMI: Body mass index is 21.99 kg/m.  Physical Exam Vitals reviewed.  Constitutional:      Appearance: He is well-developed.  HENT:     Head: Normocephalic and atraumatic.  Eyes:     General:        Right eye: No discharge.        Left eye: No discharge.  Cardiovascular:     Rate and Rhythm: Normal rate and regular rhythm.     Heart sounds: Normal heart sounds, S1 normal and S2 normal. Heart sounds not distant. No midsystolic click and no opening snap. No murmur heard.    No friction rub.  Pulmonary:     Effort: Pulmonary effort is normal. No respiratory distress.     Breath sounds: Normal breath sounds. No decreased breath sounds, wheezing, rhonchi or rales.  Chest:     Chest wall: No tenderness.  Abdominal:     General: There is no distension.  Musculoskeletal:     Cervical back: Normal range of motion.     Comments: Trivial bilateral pretibial edema.  Skin:    General: Skin is warm and dry.     Nails: There is no clubbing.  Neurological:     Mental Status: He is alert and oriented to person, place, and time.  Psychiatric:  Speech: Speech normal.        Behavior: Behavior normal.        Thought Content: Thought content normal.        Judgment: Judgment normal.     Wt Readings from Last 3 Encounters:  01/08/23 140 lb 6.4 oz (63.7 kg)  12/08/22 142 lb (64.4 kg)  12/05/22 137 lb (62.1 kg)     ASSESSMENT & PLAN:   Multivessel CAD involving the native coronary arteries without angina: No symptoms suggestive of angina or cardiac decompensation.  No longer on clopidogrel in the setting of SDH and SAH with associated midline shift.  Remains on aspirin 81 mg daily along with metoprolol tartrate 25 mg twice daily.  Not currently on statin due to possible allergic reaction with a rash.  No indication for further ischemic testing at this time.  HTN: Blood pressure is mildly elevated in the  office today.  Remains on Lopressor 25 mg twice daily with recommendation to consolidate to Toprol-XL 50 mg daily moving forward.  Patient's wife will contact our office when he has approximately 10 days left of Lopressor with plan to transition to Toprol-XL at that time.  HLD: LDL 53 in 08/2022.  Remains off atorvastatin with concern for possible rash, though this remains unclear.  Will need to revisit lipid therapy and follow-up.  Bilateral carotid artery disease: Stable 1 to 39% bilateral ICA stenoses.  Remains on aspirin as above.  PAD: No symptoms claudication or critical limb ischemia.  Has been evaluated by Dr. Kirke Corin with recommendation for medical therapy in this setting.  Should he develop symptoms consistent with critical limb ischemia would need to consider angiography.  However, he may not be a good candidate for this given history of recurrent falls with SDH and SAH.   Atrial tachycardia: Quiescent.  Remains on metoprolol as above.  History of TIA/CVA: No new deficits.  Follow-up with PCP and neurology as directed.  History of SDH and SAH in the setting of recurrent mechanical falls complicated by seizures and possible acute ischemic infarct versus shear injury: Follow-up with neurology.  Anemia with history of ABL requiring PRBC: No symptoms for bleeding.  Follow-up with PCP.  Pulmonary nodule: Previously discussed with patient with recommendation of follow-up with PCP.  This was again discussed with patient and his wife.  He is uncertain if he would want to move forward with further surveillance imaging.  Revisit in follow-up.    Disposition: F/u with Dr. Mariah Milling or an APP in 2 months.   Medication Adjustments/Labs and Tests Ordered: Current medicines are reviewed at length with the patient today.  Concerns regarding medicines are outlined above. Medication changes, Labs and Tests ordered today are summarized above and listed in the Patient Instructions accessible in Encounters.    Signed, Eula Listen, PA-C 01/08/2023 3:48 PM     [facility] [place] [address] [place], [place] [address] [phone]

## 2023-01-08 ENCOUNTER — Encounter: Payer: Self-pay | Admitting: Physician Assistant

## 2023-01-08 ENCOUNTER — Ambulatory Visit: Payer: PPO | Attending: Physician Assistant | Admitting: Physician Assistant

## 2023-01-08 VITALS — BP 152/70 | HR 66 | Ht 67.0 in | Wt 140.4 lb

## 2023-01-08 DIAGNOSIS — I10 Essential (primary) hypertension: Secondary | ICD-10-CM | POA: Diagnosis not present

## 2023-01-08 DIAGNOSIS — I739 Peripheral vascular disease, unspecified: Secondary | ICD-10-CM

## 2023-01-08 DIAGNOSIS — I4719 Other supraventricular tachycardia: Secondary | ICD-10-CM

## 2023-01-08 DIAGNOSIS — R911 Solitary pulmonary nodule: Secondary | ICD-10-CM

## 2023-01-08 DIAGNOSIS — S065XAD Traumatic subdural hemorrhage with loss of consciousness status unknown, subsequent encounter: Secondary | ICD-10-CM

## 2023-01-08 DIAGNOSIS — I6523 Occlusion and stenosis of bilateral carotid arteries: Secondary | ICD-10-CM

## 2023-01-08 DIAGNOSIS — E785 Hyperlipidemia, unspecified: Secondary | ICD-10-CM | POA: Diagnosis not present

## 2023-01-08 DIAGNOSIS — I2510 Atherosclerotic heart disease of native coronary artery without angina pectoris: Secondary | ICD-10-CM | POA: Diagnosis not present

## 2023-01-08 DIAGNOSIS — S065XAA Traumatic subdural hemorrhage with loss of consciousness status unknown, initial encounter: Secondary | ICD-10-CM

## 2023-01-08 DIAGNOSIS — Z8673 Personal history of transient ischemic attack (TIA), and cerebral infarction without residual deficits: Secondary | ICD-10-CM

## 2023-01-08 DIAGNOSIS — D649 Anemia, unspecified: Secondary | ICD-10-CM

## 2023-01-08 DIAGNOSIS — I609 Nontraumatic subarachnoid hemorrhage, unspecified: Secondary | ICD-10-CM

## 2023-01-08 MED ORDER — METOPROLOL TARTRATE 25 MG PO TABS: 25.0000 mg | ORAL_TABLET | Freq: Two times a day (BID) | ORAL | Status: AC

## 2023-01-08 NOTE — Patient Instructions (Addendum)
Medication Instructions:  Your physician recommends the following medication changes.  STOP TAKING: Toprol XL  START TAKING: Lopressor 25 mg twice daily  *If you need a refill on your cardiac medications before your next appointment, please call your pharmacy*   Follow-Up: At [facility], you and your health needs are our priority.  As part of our continuing mission to provide you with exceptional heart care, we have created designated Provider Care Teams.  These Care Teams include your primary Cardiologist (physician) and Advanced Practice Providers (APPs -  Physician Assistants and Nurse Practitioners) who all work together to provide you with the care you need, when you need it.  We recommend signing up for the patient portal called "MyChart".  Sign up information is provided on this After Visit Summary.  MyChart is used to connect with patients for Virtual Visits (Telemedicine).  Patients are able to view lab/test results, encounter notes, upcoming appointments, etc.  Non-urgent messages can be sent to your provider as well.   To learn more about what you can do with MyChart, go to [email or web address].    Your next appointment:   2 month(s)  Provider:   You may see Julien Nordmann, MD or one of the following Advanced Practice Providers on your designated Care Team:   Eula Listen, [place]

## 2023-03-13 ENCOUNTER — Ambulatory Visit: Payer: PPO | Attending: Cardiovascular Disease | Admitting: Physician Assistant

## 2023-03-13 ENCOUNTER — Ambulatory Visit: Payer: PPO | Admitting: Cardiovascular Disease

## 2023-03-13 ENCOUNTER — Encounter: Payer: Self-pay | Admitting: Physician Assistant

## 2023-03-13 VITALS — BP 158/88 | HR 94 | Ht 67.0 in | Wt 141.0 lb

## 2023-03-13 DIAGNOSIS — I739 Peripheral vascular disease, unspecified: Secondary | ICD-10-CM | POA: Diagnosis not present

## 2023-03-13 DIAGNOSIS — E785 Hyperlipidemia, unspecified: Secondary | ICD-10-CM

## 2023-03-13 DIAGNOSIS — S065XAD Traumatic subdural hemorrhage with loss of consciousness status unknown, subsequent encounter: Secondary | ICD-10-CM

## 2023-03-13 DIAGNOSIS — S065XAA Traumatic subdural hemorrhage with loss of consciousness status unknown, initial encounter: Secondary | ICD-10-CM

## 2023-03-13 DIAGNOSIS — R911 Solitary pulmonary nodule: Secondary | ICD-10-CM

## 2023-03-13 DIAGNOSIS — I10 Essential (primary) hypertension: Secondary | ICD-10-CM

## 2023-03-13 DIAGNOSIS — I609 Nontraumatic subarachnoid hemorrhage, unspecified: Secondary | ICD-10-CM

## 2023-03-13 DIAGNOSIS — I2510 Atherosclerotic heart disease of native coronary artery without angina pectoris: Secondary | ICD-10-CM

## 2023-03-13 DIAGNOSIS — I4719 Other supraventricular tachycardia: Secondary | ICD-10-CM

## 2023-03-13 DIAGNOSIS — Z8673 Personal history of transient ischemic attack (TIA), and cerebral infarction without residual deficits: Secondary | ICD-10-CM

## 2023-03-13 DIAGNOSIS — I6523 Occlusion and stenosis of bilateral carotid arteries: Secondary | ICD-10-CM

## 2023-03-13 NOTE — Progress Notes (Signed)
Cardiology Office Note    Date:  03/13/2023   ID:  Raymond Sampson, DOB Jan 14, 1941, MRN [id number]  PCP:  Gracelyn Nurse, MD  Cardiologist:  Julien Nordmann, MD  Electrophysiologist:  None   Chief Complaint: Follow-up  History of Present Illness:   Raymond Sampson is a 82 y.o. male with history of CAD with apical MI in 2007 status post stenting x 3 to the LAD with NSTEMI in 04/2022 status post PCI and DES to the LAD and RCA with staged PCI to the LCx, TIA/CVA in 2012, atrial tachycardia, bilateral carotid artery disease, dementia, PAD, DM2, HTN, HLD, and prior tobacco use who presents for follow-up of CAD.   He was admitted to the hospital in 08/2005 with an MI that was treated with PCI/DES x 3 to the LAD. Stress test in 2013 showed no significant ischemia, EF 68%, with severely elevated BP. Echo in 2014 showed an EF of 60-65%, diastolic dysfunction, normal RVSF and cavity size, normal RVSP. He was seen in early 2018 with palpitations and abnormal EKG leading to a Myoview in 04/2016 that was low risk. Outpatient cardiac monitoring in 05/2016 showed NSR with an average heart rate of 69 bpm (40-171 bpm), 7 atrial tachycardic runs occurred with the longest lasting 7 beats with an average rate of 105 bpm with no evidence of Afib.  Most recent carotid artery ultrasound from 12/2018 showed 1 to 39% bilateral ICA stenosis with bilateral vertebral arteries demonstrating antegrade flow and normal flow hemodynamics in the bilateral subclavian arteries.   He was admitted to [facility] in 04/2022 with an NSTEMI.  High-sensitivity troponin peaked at 1422.  CTA of the chest showed no evidence of PE with an incidental nodular density noted in the left lung base as well as mild right hilar lymphadenopathy, right adrenal adenoma, and aortic atherosclerosis.  Echo showed an EF of 55 to 60%, mild LVH, hypokinesis of the basal septal region, grade 1 diastolic dysfunction, normal RV systolic function and ventricular cavity  size, normal PASP, aortic valve sclerosis without evidence of stenosis, and normal estimated right atrial pressure.  LHC on 04/13/2022 showed severe three-vessel CAD as outlined below.  After further discussion, he was not felt to be a CABG candidate given advanced age and dementia with recommendation to proceed with complex PCI intervention.  He subsequently underwent repeat LHC on 04/14/2022 that again showed severe three-vessel CAD.  He underwent successful PCI/DES to the proximal LAD, PCI/DES to the proximal RCA, and PCI/DES to in-stent restenosis involving the long segment of mid/distal LAD stenting.  The entire stent was angioplastied and Onyx Frontier 2.0 x 18 mm drug-eluting stent was placed at the distal edge leading to reduction of stenosis from 70% to 20 to 30% in the proximal and mid portions with 0% residual stenosis at the distal edge and TIMI-3 flow throughout the vessel.  Following these interventions, he remained chest pain-free with family preferring the patient be discharged with planned staged PCI to the LCx be pursued in the outpatient setting.   He was seen in hospital follow-up on 04/26/2022 and was without symptoms of angina or cardiac decompensation.  He was adherent to cardiac medications.  No falls or hematochezia/melena.  His wife did report a 2-day history of dark urine following his hospital discharge that subsequently resolved without intervention and had not recurred.  Patient and wife remain interested in moving forward with staged PCI of the LCx.  He underwent staged PCI of the LCx on 05/02/2022  with procedure notable for a small hematoma that remained stable and did not require treatment beyond manual compression.  Post-procedural hemoglobin was mildly low, though stable on repeat at 10.9.  He subsequently enrolled in cardiac rehab.   Carotid artery ultrasound from 06/2022 showed 1 to 39% bilateral ICA stenosis with antegrade flow of the bilateral vertebral arteries and normal flow  hemodynamics of the bilateral subclavian arteries.  ABI in 06/2022 showed 0.95 on the right and 0.65 on the left.  Duplex on the right showed borderline significant popliteal artery stenosis.  On the left, there was moderate stenosis affecting the common femoral artery with severe stenosis in the distal SFA.   He was admitted to the hospital in 06/2022 after sustaining a mechanical fall, slipping on soap in the bathtub.  Workup showed right sided rib fractures 8-12, intramural hematoma within the paraspinal neck musculature, small soft right sided hemothorax, and acute blood loss anemia status post 1 unit PRBC.  CT imaging was also notable for thoracic aortic arch measuring 3.8 cm in diameter.  Plavix was initially held, then resumed.  During admission he did have chest pain with troponin peaking at 4936.  Conservative management was recommended.   He was evaluated by Dr. Kirke Corin for PAD in 07/2022 with recommendation for continued medical therapy given he was without claudication or critical limb ischemia.   He was admitted in 08/2022 after sustaining a mechanical fall, striking his head.  Imaging notable for SDH and SAH with midline shift.  Neurosurgery felt the patient was not a candidate for evacuation.  Admission also notable for recurrent pneumonia, acute blood loss anemia requiring 2 units PRBC, and partial focal seizures.  Discharge summary indicates aspirin and Plavix were discontinued by interventional cardiology after further consideration.  He was discharged to hospice facility, though has subsequently graduated from hospice.  He comes in accompanied by his wife today and is doing well from a cardiac perspective, without symptoms of angina or cardiac decompensation.  No dizziness, presyncope, or syncope.  No falls since he was last seen.  No hematochezia or melena.  Is dealing with some peripheral neuropathy.  Adherent and remains on aspirin 81 mg and Lopressor 25 mg twice daily.  Overall, patient and  wife feel like he is doing well from a cardiac perspective and do not have any acute cardiac concerns at this time.   Labs independently reviewed: 12/2022 - potassium 4.8, BUN 18, serum creatinine 1.1, A1c 6.1 09/2022 - Hgb 9.8, PLT 261, magnesium 2.1, albumin 2.2, AST/ALT normal 08/2022 - TSH normal, TC 116, TG 127, HDL 37, LDL 53  Past Medical History:  Diagnosis Date   Arthritic-like pain    CAD (coronary artery disease)    a. apical MI 5/07 with LHC showing 50% pCFX, 30% pRCA, 99% pLAD, 99% mLAD, 75% dLAD (cypher DES x3 to LAD); b. ETT-myoview (12/08) 81% MPHR,  EF 64%, partially reversible inferoapical perfusion defect similar to prior study 12/07; c. 04/2016 MV: EF 59%, fixed anteroapical defect, no ischemia->Low risk.   Carotid arterial disease (HCC)    a. 05/2016 Carotid U/S: 30-40% bilat dzs, f/u in 2 yrs.   Chest pain 04/13/2022   CVA (cerebral vascular accident) [facility])    a. 07/2010 - h/o CVA/TIA.   Diet-controlled type 2 diabetes mellitus (HCC)    HLD (hyperlipidemia)    HTN (hypertension)    PAT (paroxysmal atrial tachycardia) (HCC)    a. 06/2016 Event monitor:  rare episodes of atrial tachycardia, longest 7 beats.  No afib.    Past Surgical History:  Procedure Laterality Date   BACK SURGERY     CARDIAC CATHETERIZATION     CORONARY ANGIOPLASTY WITH STENT PLACEMENT     apical MI 5/07 with LHC showing 50% pCFX, 30% pRCA, 99% pLAD, 9%% mLAD, 75% dLAD. cypher DES x3 to LAD. ETT-myoview (12/08) 81% MPHR, 8'1, EF 64%, partially reversible inferoapical perfusion defect similar to prior study 12/07   CORONARY IMAGING/OCT N/A 04/14/2022   Procedure: INTRAVASCULAR IMAGING/OCT;  Surgeon: Yvonne Kendall, MD;  Location: [facility] INVASIVE CV LAB;  Service: Cardiovascular;  Laterality: N/A;   CORONARY STENT INTERVENTION N/A 04/14/2022   Procedure: CORONARY STENT INTERVENTION;  Surgeon: Yvonne Kendall, MD;  Location: [facility] INVASIVE CV LAB;  Service: Cardiovascular;  Laterality: N/A;   CORONARY  STENT INTERVENTION Left 05/02/2022   Procedure: CORONARY STENT INTERVENTION;  Surgeon: Yvonne Kendall, MD;  Location: [facility] INVASIVE CV LAB;  Service: Cardiovascular;  Laterality: Left;   LEFT HEART CATH AND CORONARY ANGIOGRAPHY N/A 04/13/2022   Procedure: LEFT HEART CATH AND CORONARY ANGIOGRAPHY;  Surgeon: Marykay Lex, MD;  Location: [facility] INVASIVE CV LAB;  Service: Cardiovascular;  Laterality: N/A;    Current Medications: Current Meds  Medication Sig   aspirin EC 81 MG tablet Take 81 mg by mouth daily. Swallow whole.   cyanocobalamin 1000 MCG tablet Take 1,000 mcg by mouth daily.   metFORMIN (GLUCOPHAGE) 1000 MG tablet Take 1,000 mg by mouth 2 (two) times daily.   metoprolol tartrate (LOPRESSOR) 25 MG tablet Take 1 tablet (25 mg total) by mouth 2 (two) times daily.   Nystatin (GERHARDT'S BUTT CREAM) CREA Apply 1 Application topically 4 (four) times daily. (Patient taking differently: Apply 1 Application topically as needed for irritation.)   sertraline (ZOLOFT) 25 MG tablet Take 25 mg by mouth every evening.    Allergies:   Metformin and related and Simvastatin   Social History   Socioeconomic History   Marital status: Married    Spouse name: Not on file   Number of children: Not on file   Years of education: Not on file   Highest education level: Not on file  Occupational History   Occupation: retired  Tobacco Use   Smoking status: Former    Current packs/day: 0.00    Average packs/day: 1 pack/day for 25.0 years (25.0 ttl pk-yrs)    Types: Cigarettes    Start date: 04/17/1960    Quit date: 04/17/1985    Years since quitting: 37.9   Smokeless tobacco: Never  Vaping Use   Vaping status: Never Used  Substance and Sexual Activity   Alcohol use: No   Drug use: No   Sexual activity: Not on file  Other Topics Concern   Not on file  Social History Narrative   Part time work at [employer]. Does not regularly exercise.    Social Determinants of Health   Financial  Resource Strain: Not on file  Food Insecurity: No Food Insecurity (08/29/2022)   Hunger Vital Sign    Worried About Running Out of Food in the Last Year: Never true    Ran Out of Food in the Last Year: Never true  Transportation Needs: No Transportation Needs (08/29/2022)   PRAPARE - Administrator, Civil Service (Medical): No    Lack of Transportation (Non-Medical): No  Physical Activity: Not on file  Stress: Not on file  Social Connections: Not on file     Family History:  The patient's Family history is  unknown by patient.  ROS:   12-point review of systems is negative unless otherwise noted in the HPI.   EKGs/Labs/Other Studies Reviewed:    Studies reviewed were summarized above. The additional studies were reviewed today:  2D echo 08/22/2022: 1. There is no left ventricular thrombus seen (Definity contrast was  used), but there is slow stagnant flow in the apical aneurysm. Left  ventricular ejection fraction, by estimation, is 60 to 65%. The left  ventricle has normal function. The left  ventricle demonstrates regional wall motion abnormalities (see scoring  diagram/findings for description). Left ventricular diastolic parameters  are consistent with Grade II diastolic dysfunction (pseudonormalization).  Elevated left atrial pressure.   2. Right ventricular systolic function is normal. The right ventricular  size is normal. There is normal pulmonary artery systolic pressure.   3. There is a small right to left shunt during the saline contrast  infusion that occurs early (after 3 beats), consistent with a small PFO.  Agitated saline contrast bubble study was positive with shunting observed  within 3-6 cardiac cycles suggestive of   interatrial shunt. There is a small patent foramen ovale.   4. The mitral valve is normal in structure. No evidence of mitral valve  regurgitation. No evidence of mitral stenosis.   5. The aortic valve is normal in structure. Aortic  valve regurgitation is  not visualized. No aortic stenosis is present.   6. The inferior vena cava is normal in size with greater than 50%  respiratory variability, suggesting right atrial pressure of 3 mmHg.   Comparison(s): Prior images reviewed side by side. Changes from prior  study are noted. 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  __________   LHC 05/02/2022: Conclusions: Patent LAD stents with stable mild in-stent restenosis of remotely placed mid-distal LAD stents, unchanged from completion of last catheterization.  Recently placed proximal LAD stent is widely patent. Severe LCx/OM2 disease, similar to prior catheterizations. Mildly elevated left ventricular filling pressure. Successful complex bifurcation PCI to LCx/OM2 using Culotte technique (Onyx frontier 2.75 x 30 mm main branch and 2.5 x 15 mm side branch drug-eluting stents) with 0% residual stenosis and TIMI-3 flow.   Recommendations: Continue dual antiplatelet therapy with aspirin and clopidogrel for at least 12 months. Aggressive secondary prevention. Overnight observation. ___________   [facility] 04/14/2022: Conclusions: Severe three-vessel coronary artery disease, as detailed in Dr. Elissa Hefty diagnostic catheterization report from yesterday. Successful PCI to proximal RCA using Onyx Frontier 2.5 x 18 mm drug-eluting stent with 0% residual stenosis and TIMI-3 flow. Successful PCI to proximal LAD stenosis using Onyx Frontier 3.5 x 15 mm drug-eluting stent with 0% residual stenosis and TIMI-3 flow. Successful angioplasty and drug-eluting stent placement to in-stent restenosis involving long segment of mid/distal LAD stenting.  The entire stent was angioplastied and an Onyx Frontier 2.0 x 18 mm drug-eluting stent placed at the distal edge, leading to reduction of stenosis from 70% to 20-30% in the proximal and mid portions and 0% residual stenosis at the distal edge with  TIMI-3 flow throughout the vessel.   Recommendations: Continue indefinite dual antiplatelet therapy with aspirin and clopidogrel. Aggressive secondary prevention of coronary artery disease. Staged PCI to LCx disease.  Timing will depend on patient's symptoms.  If he is angina free and otherwise stable for discharge over the weekend, staged PCI could be arranged to be done in the next 1 to 2 weeks as an outpatient.  If he has  recurrent angina or is otherwise still in the hospital, PCI to the LCx could be performed early next week. __________   [facility] 04/13/2022:   --------Severe 3 Vessel CAD--------------   Small caliber-codominant RCA: Prox RCA-1 lesion is 70% stenosis tapers to Prox RCA-2 lesion is 99% stenosed. (Likely Culprit Lesion #1)   Codominant circumflex:  Prox Cx lesion is 55% stenosed.  1st Mrg lesion is 70% stenosed. Just past 1st Mrf, Mid Cx to Dist Cx lesion is 80% stenosed with 70% stenosed side branch in 2nd Mrg.   Prox LAD to Mid LAD lesion is 80% stenosed - focal/concentric.  Mid LAD to Dist LAD long stented segment is 70% stenosed. In-stent restenosis. Dist LAD lesion is 95% stenosed -> involving the distal stent edge into native vessel.   -------------------------------------------   LV end diastolic pressure is moderately elevated.   There is no aortic valve stenosis.     FOR FUTURE CARDIAC CATHETERIZATION, WOULD NOT RECOMMEND USING RIGHT RADIAL ACCESS DUE TO THE EXTREME TORTUOSITY OF THE SUBCLAVIAN-INNOMINATE ARTERY AND AORTIC ARCH.   PLAN OF CARE:  Continue with plan to admit overnight.  Will restart IV heparin 2 hours after sheath removal.             Will need to meet with patient and family as well as have a heart team discussion with interventional cardiology and CVTS to determine best treatment options.  If he were to need PCI, may be best for him to be transferred to [facility] as the procedure would like to be prolonged and difficult.Potential needs to be noted to the  patient's mental status, and baseline functional status. __________   2D echo 04/13/2022: 1. Left ventricular ejection fraction, by estimation, is 55 to 60%. The  left ventricle has normal function. The left ventricle demonstrates  regional wall motion abnormalities (hypokinesis of teh basal septal  region). There is mild left ventricular  hypertrophy. Left ventricular diastolic parameters are consistent with  Grade I diastolic dysfunction (impaired relaxation).   2. Right ventricular systolic function is normal. The right ventricular  size is normal. There is normal pulmonary artery systolic pressure. The  estimated right ventricular systolic pressure is 19.3 mmHg.   3. The mitral valve is normal in structure. No evidence of mitral valve  regurgitation. No evidence of mitral stenosis.   4. The aortic valve is normal in structure. Aortic valve regurgitation is  mild. Aortic valve sclerosis is present, with no evidence of aortic valve  stenosis.   5. The inferior vena cava is normal in size with greater than 50%  respiratory variability, suggesting right atrial pressure of 3 mmHg.  __________   Carotid artery ultrasound 12/06/2018: Summary:  Right Carotid: Velocities in the right ICA are consistent with a 1-39%  stenosis.  Non-hemodynamically significant plaque <50% noted in the CCA. The ECA appears <50% stenosed.   Left Carotid: Velocities in the left ICA are consistent with a 1-39%  stenosis.  Non-hemodynamically significant plaque <50% noted in the CCA. The ECA appears >50% stenosed.   Vertebrals:  Bilateral vertebral arteries demonstrate antegrade flow.  Subclavians: Normal flow hemodynamics were seen in bilateral subclavian arteries.  __________   Luci Bank patch 05/2016: Normal sinus rhythm Min HR of 40 bpm, max HR of 171 bpm, and avg HR of 69 bpm.    7 Atrial tachycardia runs occurred, the longest lasting 7 beats with an avg rate of 105 bpm.   Isolated SVEs were rare (<1.0%), SVE  Couplets were rare (<  1.0%), and SVE Triplets were rare (<1.0%).  Isolated VEs were rare (<1.0%), VE Couplets were rare (<1.0%), and no VE Triplets were present. __________   [facility] 05/02/2016: Pharmacological myocardial perfusion imaging study with no significant  Ischemia Small region of predominantly fixed defect in the anteroapical wall of mild severity Normal wall motion (GI uptake artifact noted),  EF estimated at 59% No EKG changes concerning for ischemia at peak stress or in recovery. Severe hypertension noted, SBP 180 Low risk scan   EKG:  EKG is not ordered today.    Recent Labs: 09/01/2022: TSH 0.781 09/03/2022: ALT 23 09/12/2022: Magnesium 2.1 09/22/2022: BUN 48; Creatinine, Ser 1.05; Hemoglobin 9.8; Platelets 261; Potassium 3.7; Sodium 139  Recent Lipid Panel    Component Value Date/Time   CHOL 205 (H) 04/13/2022 0430   CHOL 132 05/29/2019 1113   CHOL 138 12/28/2012 0506   TRIG 257 (H) 04/13/2022 0430   TRIG 197 12/28/2012 0506   TRIG 111 08/06/2008 0000   HDL 40 (L) 04/13/2022 0430   HDL 34 (L) 05/29/2019 1113   HDL 34 (L) 12/28/2012 0506   CHOLHDL 5.1 04/13/2022 0430   VLDL 51 (H) 04/13/2022 0430   VLDL 39 12/28/2012 0506   LDLCALC 114 (H) 04/13/2022 0430   LDLCALC 64 05/29/2019 1113   LDLCALC 65 12/28/2012 0506   LDLDIRECT 65 08/21/2022 1432    PHYSICAL EXAM:    VS:  BP (!) 158/88 (BP Location: Left Arm, Patient Position: Sitting, Cuff Size: Normal)   Pulse 94   Ht 5\' 7"  (1.702 m)   Wt 141 lb (64 kg)   SpO2 93%   BMI 22.08 kg/m   BMI: Body mass index is 22.08 kg/m.  Physical Exam Vitals reviewed.  Constitutional:      Appearance: He is well-developed.  HENT:     Head: Normocephalic and atraumatic.  Eyes:     General:        Right eye: No discharge.        Left eye: No discharge.  Neck:     Vascular: No JVD.  Cardiovascular:     Rate and Rhythm: Normal rate and regular rhythm.     Pulses:          Posterior tibial pulses are 2+ on the  right side and 2+ on the left side.     Heart sounds: Normal heart sounds, S1 normal and S2 normal. Heart sounds not distant. No midsystolic click and no opening snap. No murmur heard.    No friction rub.  Pulmonary:     Effort: Pulmonary effort is normal. No respiratory distress.     Breath sounds: Normal breath sounds. No decreased breath sounds, wheezing, rhonchi or rales.  Chest:     Chest wall: No tenderness.  Abdominal:     General: There is no distension.  Musculoskeletal:     Cervical back: Normal range of motion.     Right lower leg: No edema.     Left lower leg: No edema.  Skin:    General: Skin is warm and dry.     Nails: There is no clubbing.  Neurological:     Mental Status: He is alert and oriented to person, place, and time.  Psychiatric:        Speech: Speech normal.        Behavior: Behavior normal.        Thought Content: Thought content normal.        Judgment: Judgment normal.  Wt Readings from Last 3 Encounters:  03/13/23 141 lb (64 kg)  01/08/23 140 lb 6.4 oz (63.7 kg)  12/08/22 142 lb (64.4 kg)     ASSESSMENT & PLAN:   Multivessel CAD involving the native coronary arteries without angina: No symptoms concerning for angina or cardiac decompensation.  No longer on clopidogrel in the setting of SDH and SAH as outlined above.  Remains on aspirin 81 mg daily along with Lopressor 25 mg twice daily.  Not currently on statin due to possible allergic reaction with rash, advanced age, and prior hospice status.  No indication for further ischemic testing at this time.  HTN: Blood pressure is mildly elevated in the office today, though typically well-controlled.  He remains on Lopressor 25 mg twice daily with recommendation to consolidate to Toprol-XL 50 mg daily moving forward.  His wife will contact our office when he has approximately 10 days of Lopressor left with plans to transition to Toprol-XL 50 mg daily at that time.  HLD: LDL 53 in 08/2022.  No longer on  statin out of concern for possible allergic reaction.  Bilateral carotid artery disease: Stable 1 to 39% bilateral ICA stenoses.  Remains on aspirin as above.  PAD: No symptoms of claudication or critical limb ischemia.  He has been evaluated by Dr. Kirke Corin with recommendation for medical therapy in this setting. Should he develop symptoms consistent with critical limb ischemia would need to consider angiography. However, he may not be a good candidate for this given history of recurrent falls with SDH and SAH.   Atrial tachycardia: Quiescent. Remains on metoprolol as above.   History of TIA/CVA: No new deficits.  Follow-up with PCP and neurology as directed.  History of SDH and SAH in the setting of recurrent mechanical falls complicated by seizures and possible acute ischemic infarct versus shear injury: Follow-up with neurology.  Pulmonary nodule: Previously discussed with patient and his wife with recommendation to follow-up with PCP.  Historically, he has been uncertain if he would want to move forward with further surveillance imaging.     Disposition: F/u with Dr. Mariah Milling or an APP in 4 months.   Medication Adjustments/Labs and Tests Ordered: Current medicines are reviewed at length with the patient today.  Concerns regarding medicines are outlined above. Medication changes, Labs and Tests ordered today are summarized above and listed in the Patient Instructions accessible in Encounters.   Signed, Eula Listen, PA-C 03/13/2023 2:21 PM     [facility] [place] [address] [place], [place] [address] [phone]

## 2023-03-13 NOTE — Patient Instructions (Signed)
Medication Instructions:  Your Physician recommend you continue on your current medication as directed.    *If you need a refill on your cardiac medications before your next appointment, please call your pharmacy*   Lab Work: None ordered at this time    Follow-Up: At [facility], you and your health needs are our priority.  As part of our continuing mission to provide you with exceptional heart care, we have created designated Provider Care Teams.  These Care Teams include your primary Cardiologist (physician) and Advanced Practice Providers (APPs -  Physician Assistants and Nurse Practitioners) who all work together to provide you with the care you need, when you need it.  We recommend signing up for the patient portal called "MyChart".  Sign up information is provided on this After Visit Summary.  MyChart is used to connect with patients for Virtual Visits (Telemedicine).  Patients are able to view lab/test results, encounter notes, upcoming appointments, etc.  Non-urgent messages can be sent to your provider as well.   To learn more about what you can do with MyChart, go to [email or web address].    Your next appointment:   4 month(s)  Provider:   You may see Julien Nordmann, MD or one of the following Advanced Practice Providers on your designated Care Team:   Eula Listen, [place]

## 2023-05-14 ENCOUNTER — Other Ambulatory Visit: Payer: Self-pay

## 2023-05-14 ENCOUNTER — Emergency Department
Admission: EM | Admit: 2023-05-14 | Discharge: 2023-05-14 | Disposition: A | Discharge status: Home / Self Care | Payer: PPO | Attending: Emergency Medicine | Admitting: Emergency Medicine

## 2023-05-14 DIAGNOSIS — I10 Essential (primary) hypertension: Secondary | ICD-10-CM | POA: Diagnosis present

## 2023-05-14 DIAGNOSIS — N390 Urinary tract infection, site not specified: Secondary | ICD-10-CM | POA: Insufficient documentation

## 2023-05-14 DIAGNOSIS — F03A0 Unspecified dementia, mild, without behavioral disturbance, psychotic disturbance, mood disturbance, and anxiety: Secondary | ICD-10-CM | POA: Diagnosis not present

## 2023-05-14 DIAGNOSIS — I2510 Atherosclerotic heart disease of native coronary artery without angina pectoris: Secondary | ICD-10-CM | POA: Insufficient documentation

## 2023-05-14 DIAGNOSIS — R7989 Other specified abnormal findings of blood chemistry: Secondary | ICD-10-CM | POA: Insufficient documentation

## 2023-05-14 DIAGNOSIS — Z20822 Contact with and (suspected) exposure to covid-19: Secondary | ICD-10-CM | POA: Insufficient documentation

## 2023-05-14 DIAGNOSIS — R5383 Other fatigue: Secondary | ICD-10-CM | POA: Insufficient documentation

## 2023-05-14 DIAGNOSIS — R531 Weakness: Secondary | ICD-10-CM | POA: Insufficient documentation

## 2023-05-14 DIAGNOSIS — R4182 Altered mental status, unspecified: Secondary | ICD-10-CM | POA: Insufficient documentation

## 2023-05-14 DIAGNOSIS — E785 Hyperlipidemia, unspecified: Secondary | ICD-10-CM | POA: Diagnosis not present

## 2023-05-14 LAB — CBC WITH DIFFERENTIAL/PLATELET
Abs Immature Granulocytes: 0.03 10*3/uL (ref 0.00–0.07)
Basophils Absolute: 0 10*3/uL (ref 0.0–0.1)
Basophils Relative: 0 %
Eosinophils Absolute: 0.2 10*3/uL (ref 0.0–0.5)
Eosinophils Relative: 3 %
HCT: 38.6 % — ABNORMAL LOW (ref 39.0–52.0)
Hemoglobin: 13.1 g/dL (ref 13.0–17.0)
Immature Granulocytes: 0 %
Lymphocytes Relative: 19 %
Lymphs Abs: 1.6 10*3/uL (ref 0.7–4.0)
MCH: 30.8 pg (ref 26.0–34.0)
MCHC: 33.9 g/dL (ref 30.0–36.0)
MCV: 90.8 fL (ref 80.0–100.0)
Monocytes Absolute: 0.6 10*3/uL (ref 0.1–1.0)
Monocytes Relative: 8 %
Neutro Abs: 5.7 10*3/uL (ref 1.7–7.7)
Neutrophils Relative %: 70 %
Platelets: 326 10*3/uL (ref 150–400)
RBC: 4.25 MIL/uL (ref 4.22–5.81)
RDW: 13.4 % (ref 11.5–15.5)
WBC: 8.2 10*3/uL (ref 4.0–10.5)
nRBC: 0 % (ref 0.0–0.2)

## 2023-05-14 LAB — TROPONIN I (HIGH SENSITIVITY): Troponin I (High Sensitivity): 20 ng/L — ABNORMAL HIGH (ref <18)

## 2023-05-14 LAB — BASIC METABOLIC PANEL
Anion gap: 14 (ref 5–15)
BUN: 29 mg/dL — ABNORMAL HIGH (ref 8–23)
CO2: 26 mmol/L (ref 22–32)
Calcium: 9.7 mg/dL (ref 8.9–10.3)
Chloride: 97 mmol/L — ABNORMAL LOW (ref 98–111)
Creatinine, Ser: 1.21 mg/dL (ref 0.61–1.24)
GFR, Estimated: 60 mL/min — ABNORMAL LOW (ref >60)
Glucose, Bld: 146 mg/dL — ABNORMAL HIGH (ref 70–99)
Potassium: 4.7 mmol/L (ref 3.5–5.1)
Sodium: 137 mmol/L (ref 135–145)

## 2023-05-14 LAB — URINALYSIS, ROUTINE W REFLEX MICROSCOPIC
Bilirubin Urine: NEGATIVE
Glucose, UA: NEGATIVE mg/dL
Hgb urine dipstick: NEGATIVE
Ketones, ur: NEGATIVE mg/dL
Nitrite: NEGATIVE
Protein, ur: 100 mg/dL — AB
Specific Gravity, Urine: 1.016 (ref 1.005–1.030)
Squamous Epithelial / HPF: 0 /[HPF] (ref 0–5)
pH: 6 (ref 5.0–8.0)

## 2023-05-14 LAB — RESP PANEL BY RT-PCR (RSV, FLU A&B, COVID)  RVPGX2
Influenza A by PCR: NEGATIVE
Influenza B by PCR: NEGATIVE
Resp Syncytial Virus by PCR: NEGATIVE
SARS Coronavirus 2 by RT PCR: NEGATIVE

## 2023-05-14 MED ORDER — SODIUM CHLORIDE 0.9 % IV SOLN
1.0000 g | Freq: Once | INTRAVENOUS | Status: AC
  Administered 2023-05-14: 1 g via INTRAVENOUS
  Filled 2023-05-14: qty 10

## 2023-05-14 MED ORDER — SODIUM CHLORIDE 0.9 % IV BOLUS
500.0000 mL | Freq: Once | INTRAVENOUS | Status: AC
  Administered 2023-05-14: 500 mL via INTRAVENOUS

## 2023-05-14 MED ORDER — CEPHALEXIN 500 MG PO CAPS: 500.0000 mg | ORAL_CAPSULE | Freq: Two times a day (BID) | ORAL | 0 refills | Status: AC

## 2023-05-14 NOTE — Discharge Instructions (Addendum)
 Take the antibiotic as prescribed and finish the full 7-day course.  Return to the ER for new, worsening, or persistent severe weakness or lethargy, fever, vomiting, or any other new or worsening symptoms that concern you.  You should also keep a log of your blood pressures over the next several days.  Follow-up with your primary care doctor.

## 2023-05-14 NOTE — ED Notes (Signed)
 CCMD called and verified patient on cardiac tele

## 2023-05-14 NOTE — ED Triage Notes (Signed)
 Per EMS:  Hypertension Wife reports up/down all day Possible UTI Urine odor

## 2023-05-14 NOTE — ED Notes (Addendum)
 [facility] at [phone] called and notified patient is in the ER.

## 2023-05-14 NOTE — ED Provider Notes (Signed)
[facility] Provider Note    Event Date/Time   First MD Initiated Contact with Patient 05/14/23 1834     (approximate)   History   Hypertension   HPI  LUIE LEANG is a 83 y.o. male with history of mild dementia, CAD, hypertension, and hyperlipidemia who presents with altered mental status and elevated blood pressure readings.  The wife is the primary historian.  The patient denies any acute complaints at this time.  His wife notes that he has been slightly more lethargic and weak in the last few days than his baseline.  However he has not been acutely confused.  She also noted an abnormal urine odor.  She states that she has checked his blood pressure several times and has noted to be variable but with readings as high as the 180s intermittently.  The patient denies any chest pain, difficulty breathing, dizziness, or any other acute pain.  I reviewed the past medical records.  The patient's most recent outpatient encounter was follow-up with cardiology on 12/10.  He was admitted to the hospital service in June of last year after a fall.  He is on hospice currently.   Physical Exam   Triage Vital Signs: ED Triage Vitals  Encounter Vitals Group     BP 05/14/23 1837 (!) 212/85     Systolic BP Percentile --      Diastolic BP Percentile --      Pulse Rate 05/14/23 1837 67     Resp 05/14/23 1837 18     Temp 05/14/23 1837 97.8 F (36.6 C)     Temp Source 05/14/23 1837 Oral     SpO2 05/14/23 1837 96 %     Weight --      Height --      Head Circumference --      Peak Flow --      Pain Score 05/14/23 1838 0     Pain Loc --      Pain Education --      Exclude from Growth Chart --     Most recent vital signs: Vitals:   05/14/23 2200 05/14/23 2230  BP: (!) 191/86 (!) 194/94  Pulse: 77 75  Resp: 13   Temp:    SpO2: 91% 96%     General: Alert, oriented x 3,, no distress.  CV:  Good peripheral perfusion.  Resp:  Normal effort.  Lungs  CTAB. Abd:  No distention.  Other:  EOMI.  PERRLA.  No photophobia.  Normal speech.  Motor intact in all extremities.  Slightly dry mucous membranes.  Flat affect.   ED Results / Procedures / Treatments   Labs (all labs ordered are listed, but only abnormal results are displayed) Labs Reviewed  BASIC METABOLIC PANEL - Abnormal; Notable for the following components:      Result Value   Chloride 97 (*)    Glucose, Bld 146 (*)    BUN 29 (*)    GFR, Estimated 60 (*)    All other components within normal limits  CBC WITH DIFFERENTIAL/PLATELET - Abnormal; Notable for the following components:   HCT 38.6 (*)    All other components within normal limits  URINALYSIS, ROUTINE W REFLEX MICROSCOPIC - Abnormal; Notable for the following components:   Color, Urine AMBER (*)    APPearance CLOUDY (*)    Protein, ur 100 (*)    Leukocytes,Ua SMALL (*)    Bacteria, UA MANY (*)    All other  components within normal limits  TROPONIN I (HIGH SENSITIVITY) - Abnormal; Notable for the following components:   Troponin I (High Sensitivity) 20 (*)    All other components within normal limits  RESP PANEL BY RT-PCR (RSV, FLU A&B, COVID)  [id number]     EKG  ED ECG REPORT I, Lind Repine, the attending physician, personally viewed and interpreted this ECG.  Date: 05/14/2023 EKG Time: 1934 Rate: 74 Rhythm: normal sinus rhythm QRS Axis: normal Intervals: normal ST/T Wave abnormalities: normal Narrative Interpretation: no evidence of acute ischemia    RADIOLOGY     PROCEDURES:  Critical Care performed: No  Procedures   MEDICATIONS ORDERED IN ED: Medications  sodium chloride  0.9 % bolus 500 mL (500 mLs Intravenous New Bag/Given 05/14/23 2214)  cefTRIAXone  (ROCEPHIN ) 1 g in sodium chloride  0.9 % 100 mL IVPB (1 g Intravenous New Bag/Given 05/14/23 2216)     IMPRESSION / MDM / ASSESSMENT AND PLAN / ED COURSE  I reviewed the triage vital signs and the nursing notes.  83 year old male  with PMH as noted above presents with increased lethargy over the last several days as well as intermittent elevated blood pressure readings.  The patient denies any acute complaints.  Neurologic exam is nonfocal.  He is hypertensive with otherwise normal vital signs.  Differential diagnosis includes, but is not limited to, UTI, COVID, flu, or other infection, dehydration, electrolyte abnormality, other metabolic disturbance, less likely ACS or other cardiac etiology.  Per the wife, the patient is normally compliant with his medications, which she prepares for him every day, but his morning dose was late today.  We will obtain basic labs, respiratory panel, urinalysis, cardiac enzymes, and reassess.  Given the nonfocal neurologic exam there is no indication for brain imaging at this time.  Patient's presentation is most consistent with acute complicated illness / injury requiring diagnostic workup.  The patient is on the cardiac monitor to evaluate for evidence of arrhythmia and/or significant heart rate changes.  ----------------------------------------- 11:26 PM on 05/14/2023 -----------------------------------------  Urinalysis shows evidence of UTI.  Other lab workup is unremarkable.  Troponin is minimally elevated, baseline for the patient.  Respiratory panel is negative.  BMP and CBC show no concerning acute findings.  The patient's blood pressure is still elevated but there is no evidence of end organ dysfunction or indication for emergent treatment.  I did consider whether the patient may benefit from inpatient admission due to his weakness.  I discussed this with the patient and his wife.  The patient is a hospice patient and has a strong preference to go home.  His wife feels comfortable with this plan.  Therefore, I have ordered a dose of IV ceftriaxone  here as well as fluids.  Once these are completed, the patient will go home.  He is stable for discharge at this time.  Return  precautions given, and the wife expressed understanding.   FINAL CLINICAL IMPRESSION(S) / ED DIAGNOSES   Final diagnoses:  Hypertension, unspecified type  Urinary tract infection without hematuria, site unspecified     Rx / DC Orders   ED Discharge Orders          Ordered    cephALEXin  (KEFLEX ) 500 MG capsule  2 times daily        05/14/23 2301             Note:  This document was prepared using Dragon voice recognition software and may include unintentional dictation errors.    Lind Repine, MD  05/14/23 2328  

## 2023-05-16 ENCOUNTER — Telehealth: Payer: Self-pay | Admitting: Cardiovascular Disease

## 2023-05-16 NOTE — Telephone Encounter (Signed)
Called patient, LVM to call back.  Left call back number.

## 2023-05-16 NOTE — Telephone Encounter (Signed)
Pt c/o BP issue: STAT if pt c/o blurred vision, one-sided weakness or slurred speech  1. What are your last 5 BP readings? 138/60  194/100   2. Are you having any other symptoms (ex. Dizziness, headache, blurred vision, passed out)? Slight vision change (unsure if it is related)   3. What is your BP issue? Patient was seen in ED recently for UTI and is having issue with higher BP readings. She states at last visit with cardiologist it was mentioned that a change in medication may be needed. Requesting call back to discuss further.

## 2023-05-17 NOTE — Telephone Encounter (Signed)
Left a message for the patient to call back.

## 2023-05-18 NOTE — Telephone Encounter (Signed)
Left a message for the patient to call back.

## 2023-05-21 ENCOUNTER — Telehealth: Payer: Self-pay | Admitting: Cardiovascular Disease

## 2023-05-21 NOTE — Telephone Encounter (Signed)
Patient returned RN's call, see note dated 2/12.

## 2023-05-22 NOTE — Telephone Encounter (Signed)
 Left voicemail message to call back

## 2023-05-25 NOTE — Telephone Encounter (Signed)
 Left a message for the patient to call back.

## 2023-05-28 NOTE — Telephone Encounter (Signed)
 Attempted to contact patient, LVM advising to call back if assistance was still needed.   Left call back number.   Will remove from triage after multiple attempts to contact patient.

## 2023-07-03 DEATH — deceased

## 2023-07-19 ENCOUNTER — Ambulatory Visit: Payer: PPO | Admitting: Physician Assistant
